# Patient Record
Sex: Female | Born: 1945 | Race: White | Hispanic: No | Marital: Married | Smoking: Former smoker
Health system: Southern US, Community
[De-identification: ages and names within clinical notes are randomized; demographics above are authoritative.]

## PROBLEM LIST (undated history)

## (undated) DIAGNOSIS — R011 Cardiac murmur, unspecified: Secondary | ICD-10-CM

## (undated) DIAGNOSIS — F419 Anxiety disorder, unspecified: Secondary | ICD-10-CM

## (undated) DIAGNOSIS — F329 Major depressive disorder, single episode, unspecified: Secondary | ICD-10-CM

## (undated) DIAGNOSIS — J45909 Unspecified asthma, uncomplicated: Secondary | ICD-10-CM

## (undated) DIAGNOSIS — G709 Myoneural disorder, unspecified: Secondary | ICD-10-CM

## (undated) DIAGNOSIS — I639 Cerebral infarction, unspecified: Secondary | ICD-10-CM

## (undated) DIAGNOSIS — D689 Coagulation defect, unspecified: Secondary | ICD-10-CM

## (undated) DIAGNOSIS — F0390 Unspecified dementia without behavioral disturbance: Secondary | ICD-10-CM

## (undated) DIAGNOSIS — E039 Hypothyroidism, unspecified: Secondary | ICD-10-CM

## (undated) DIAGNOSIS — H269 Unspecified cataract: Secondary | ICD-10-CM

## (undated) DIAGNOSIS — J1282 Pneumonia due to coronavirus disease 2019: Secondary | ICD-10-CM

## (undated) DIAGNOSIS — I219 Acute myocardial infarction, unspecified: Secondary | ICD-10-CM

## (undated) DIAGNOSIS — Z5189 Encounter for other specified aftercare: Secondary | ICD-10-CM

## (undated) DIAGNOSIS — R0600 Dyspnea, unspecified: Secondary | ICD-10-CM

## (undated) DIAGNOSIS — U071 COVID-19: Secondary | ICD-10-CM

## (undated) DIAGNOSIS — M1990 Unspecified osteoarthritis, unspecified site: Secondary | ICD-10-CM

## (undated) DIAGNOSIS — E079 Disorder of thyroid, unspecified: Secondary | ICD-10-CM

## (undated) DIAGNOSIS — I4891 Unspecified atrial fibrillation: Secondary | ICD-10-CM

## (undated) DIAGNOSIS — F32A Depression, unspecified: Secondary | ICD-10-CM

## (undated) DIAGNOSIS — R000 Tachycardia, unspecified: Secondary | ICD-10-CM

## (undated) DIAGNOSIS — N189 Chronic kidney disease, unspecified: Secondary | ICD-10-CM

## (undated) DIAGNOSIS — R519 Headache, unspecified: Secondary | ICD-10-CM

## (undated) DIAGNOSIS — E785 Hyperlipidemia, unspecified: Secondary | ICD-10-CM

## (undated) DIAGNOSIS — K219 Gastro-esophageal reflux disease without esophagitis: Secondary | ICD-10-CM

## (undated) DIAGNOSIS — G4730 Sleep apnea, unspecified: Secondary | ICD-10-CM

## (undated) DIAGNOSIS — I509 Heart failure, unspecified: Secondary | ICD-10-CM

## (undated) DIAGNOSIS — T7840XA Allergy, unspecified, initial encounter: Secondary | ICD-10-CM

## (undated) DIAGNOSIS — D649 Anemia, unspecified: Secondary | ICD-10-CM

## (undated) DIAGNOSIS — J449 Chronic obstructive pulmonary disease, unspecified: Secondary | ICD-10-CM

## (undated) DIAGNOSIS — E119 Type 2 diabetes mellitus without complications: Secondary | ICD-10-CM

## (undated) DIAGNOSIS — Z87442 Personal history of urinary calculi: Secondary | ICD-10-CM

## (undated) DIAGNOSIS — R569 Unspecified convulsions: Secondary | ICD-10-CM

## (undated) HISTORY — DX: Sleep apnea, unspecified: G47.30

## (undated) HISTORY — DX: Type 2 diabetes mellitus without complications: E11.9

## (undated) HISTORY — DX: Major depressive disorder, single episode, unspecified: F32.9

## (undated) HISTORY — DX: Chronic obstructive pulmonary disease, unspecified: J44.9

## (undated) HISTORY — DX: Depression, unspecified: F32.A

## (undated) HISTORY — DX: Chronic kidney disease, unspecified: N18.9

## (undated) HISTORY — DX: Encounter for other specified aftercare: Z51.89

## (undated) HISTORY — PX: APPENDECTOMY: SHX54

## (undated) HISTORY — DX: Unspecified atrial fibrillation: I48.91

## (undated) HISTORY — DX: Unspecified asthma, uncomplicated: J45.909

## (undated) HISTORY — DX: Disorder of thyroid, unspecified: E07.9

## (undated) HISTORY — DX: Allergy, unspecified, initial encounter: T78.40XA

## (undated) HISTORY — PX: CHOLECYSTECTOMY: SHX55

## (undated) HISTORY — PX: CORONARY ARTERY BYPASS GRAFT: SHX141

## (undated) HISTORY — PX: EYE SURGERY: SHX253

## (undated) HISTORY — DX: Unspecified osteoarthritis, unspecified site: M19.90

## (undated) HISTORY — PX: ABDOMINAL HYSTERECTOMY: SHX81

## (undated) HISTORY — DX: Cerebral infarction, unspecified: I63.9

## (undated) HISTORY — DX: Heart failure, unspecified: I50.9

## (undated) HISTORY — DX: Anxiety disorder, unspecified: F41.9

## (undated) HISTORY — DX: Myoneural disorder, unspecified: G70.9

## (undated) HISTORY — DX: Acute myocardial infarction, unspecified: I21.9

## (undated) HISTORY — DX: Anemia, unspecified: D64.9

## (undated) HISTORY — PX: FRACTURE SURGERY: SHX138

## (undated) HISTORY — DX: Hyperlipidemia, unspecified: E78.5

## (undated) HISTORY — DX: Unspecified cataract: H26.9

## (undated) HISTORY — DX: Gastro-esophageal reflux disease without esophagitis: K21.9

## (undated) HISTORY — DX: Unspecified convulsions: R56.9

## (undated) HISTORY — DX: Cardiac murmur, unspecified: R01.1

## (undated) HISTORY — PX: TONSILLECTOMY AND ADENOIDECTOMY: SHX28

## (undated) HISTORY — PX: JOINT REPLACEMENT: SHX530

## (undated) HISTORY — DX: Coagulation defect, unspecified: D68.9

## (undated) HISTORY — PX: OTHER SURGICAL HISTORY: SHX169

## (undated) HISTORY — PX: ESOPHAGOGASTRODUODENOSCOPY ENDOSCOPY: SHX5814

## (undated) HISTORY — PX: TUBAL LIGATION: SHX77

---

## 2006-06-13 ENCOUNTER — Encounter
Admission: RE | Admit: 2006-06-13 | Discharge: 2006-09-11 | Payer: Self-pay | Admitting: Physical Medicine & Rehabilitation

## 2006-06-13 ENCOUNTER — Ambulatory Visit: Payer: Self-pay | Admitting: Physical Medicine & Rehabilitation

## 2006-06-14 ENCOUNTER — Encounter: Admission: RE | Admit: 2006-06-14 | Discharge: 2006-06-14 | Payer: Self-pay | Admitting: Vascular Surgery

## 2006-06-14 ENCOUNTER — Ambulatory Visit: Payer: Self-pay | Admitting: Vascular Surgery

## 2006-07-16 ENCOUNTER — Ambulatory Visit: Payer: Self-pay | Admitting: Physical Medicine & Rehabilitation

## 2006-08-01 ENCOUNTER — Encounter: Admission: RE | Admit: 2006-08-01 | Discharge: 2006-10-30 | Payer: Self-pay | Admitting: Anesthesiology

## 2006-08-06 ENCOUNTER — Ambulatory Visit: Payer: Self-pay | Admitting: Anesthesiology

## 2006-08-16 ENCOUNTER — Ambulatory Visit: Payer: Self-pay | Admitting: Physical Medicine & Rehabilitation

## 2006-10-11 ENCOUNTER — Ambulatory Visit: Payer: Self-pay | Admitting: Physical Medicine & Rehabilitation

## 2006-11-21 ENCOUNTER — Encounter
Admission: RE | Admit: 2006-11-21 | Discharge: 2007-01-21 | Payer: Self-pay | Admitting: Physical Medicine & Rehabilitation

## 2006-11-21 ENCOUNTER — Ambulatory Visit: Payer: Self-pay | Admitting: Physical Medicine & Rehabilitation

## 2007-02-21 ENCOUNTER — Ambulatory Visit: Payer: Self-pay | Admitting: Physical Medicine & Rehabilitation

## 2007-02-21 ENCOUNTER — Encounter
Admission: RE | Admit: 2007-02-21 | Discharge: 2007-02-24 | Payer: Self-pay | Admitting: Physical Medicine & Rehabilitation

## 2009-07-18 ENCOUNTER — Ambulatory Visit: Payer: Self-pay | Admitting: Vascular Surgery

## 2010-09-05 NOTE — Assessment & Plan Note (Signed)
Jenny Kelly is back regarding her abdominal knee pain.  Knee is continuing to  do well.  She had been doing fairly well until recently when she has had  a few more spikes in her abdominal pain.  We did not get her cleared for  the celiac plexus blocks due to anesthesia concerns over bleeding so  this effort was abandoned.  Cymbalta helps her mood.  She uses Donnatal  for spasms with some relief.  She tolerates her pain as 6-7/10.  Describes it as constant, dull and sharp.  Sleep is poor due to pain.  Does use Klonopin apparently at night for sleep.   REVIEW OF SYSTEMS:  Notable for spasm, compression and numbness.  Other  pertinent problems listed above and full review has been written in the  health and history section of the chart.   SOCIAL HISTORY:  Without significant change.   PHYSICAL EXAMINATION:  Blood pressure is 157/87, pulse 76, respiratory  rate 18.  She is sating 100% on room air.  The patient was pleasant,  alert and oriented x3.  Her weight has increased a bit and is noticeable  on exam today.  The patient is tan.  Skin is intact. Pulses are 2+.  Cognitively she is appropriate.  Heart is regular.  Chest is clear.  Abdomen soft, nontender.  Motor function is 5/5 with normal sensory exam  today.   ASSESSMENT:  1. Chronic abdominal pain related to irritable bowel syndrome.  2. Questionable interstitial cystitis.  3. Right knee osteoarthritis.  4. Anxiety disorder.  5. Insomnia.   PLAN:  1. Continue peridium and Donnatal p.r.n.  Will add scopolamine p.r.n.      for abdominal spasms as well. Norco for breakthrough  pain for now      10/325 1 q 6 h p.r.n.  Consider stronger breakthrough pain      medication such as Dilaudid for more severe spasms.  2. Consider long acting opiate.  3. Continue to use Cymbalta for mood.  4. Continue activity and exercise.  I am encouraged by her increased      appetite and weight gain.  5. Continue Flector Patches for arthritis.      Jenny Kelly, M.D.  Electronically Signed     ZTS/MedQ  D:  11/25/2006 11:13:13  T:  11/25/2006 11:30:06  Job #:  [id number]   cc:   Jenny Kelly. Jenny Kelly, M.D.  Fax: [phone]

## 2010-09-05 NOTE — Assessment & Plan Note (Signed)
Jenny Kelly is back regarding her abdominal pain.  We have had some conflicts  regarding her anticoagulation and potential celiac plexus block.  It  seems to me that we are on the same page now.  The patient hopes to have  her injection.  The patient's pain has been about stable, it is a 4-5  out of 10.  She does complain more of swelling as of late.  She tends to  have some swelling in the summertime.  She is on Lasix.  Other  identifiable cause has been found.  INR has actually been high as of  late.  She uses her Norco 10/325 for breakthrough pain.  Uses it 1 q.6  h. p.r.n.  Pain interferes with general activity, relations with others,  and enjoyment of life on a moderate level.  Pain is intermittent, sharp,  dull, and sometimes aching.  Sleep is poor at times.  She does like the  Cymbalta that we started for her mood, and she has more energy with this  as well.  Pyridium has helped her bladder spasms.  She uses Donnatal  occasionally for breakthrough symptomatology.   REVIEW OF SYSTEMS:  Notable for depression, weakness, urine retention,  nausea, constipation, abdominal pain, poor appetite.  Other pertinent  positives are listed above and full review is in the health and history  section.   SOCIAL HISTORY:  Without significant change.   PHYSICAL EXAMINATION:  Blood pressure is 113/43, pulse is 68,  respiratory rate 18.  She is satting 99% on room air.  The patient is pleasant, alert and oriented x3.  Affect is bright and  generally appropriate.  She has 1+ edema in both legs.  Legs are cool.  She did have pulses at  posterior, tibial and dorsalis pedis arteries.  CHEST:  Clear.  ABDOMEN:  Soft, nontender today.  Cognitively she was appropriate.  Motor functions 5/5.  Sensory exam is intact.   ASSESSMENT:  1. Chronic abdominal pain related to irritable bowel syndrome, failing      multiple modalities and treatments.  Questionable interstitial      cystitis as well.  2. Right knee  osteoarthritis, doing well on Flector patches.  3. Anxiety disorder.   PLAN:  1. Will set patient up again for celiac plexus block with Dr. Stevphen Rochester      hopefully next month.  2. Continue Pyridium and Donnatal p.r.n. for bladder and bowel spasms.  3. Norco for breakthrough pain.  4. Cymbalta for mood.  5. Flector patches for arthritic symptoms.      Ranelle Oyster, M.D.  Electronically Signed     ZTS/MedQ  D:  10/16/2006 11:41:58  T:  10/16/2006 16:05:29  Job #:  [id number]   cc:   Quita Skye. Artis Flock, M.D.  Fax: [phone]

## 2010-09-05 NOTE — Assessment & Plan Note (Signed)
Jenny Kelly is back regarding her abdominal pain. She actually did very well  with the p.r.n. scopolamine, although she has been out of it for the  last month or so. She had been away in [place] for some time visiting  family. She complains of some weight gain as well as some abdominal  swelling in the upper quadrants. She rates her pain at a 3/4. In  general, her pain has been better. She has some dull and aching pain at  times. Her legs occasionally bother her. Sleep can be poor. She has had  a rash that she notes and is not sure of the source.   REVIEW OF SYSTEMS:  Is notable for tremor, confusion and depression.  Other pertinent positives are listed above. She remains on Coumadin.  Full review is in the written Health and History section on the chart.   SOCIAL HISTORY:  The patient is married.   PHYSICAL EXAMINATION:  Blood pressure 137/57, pulse 88. Respiratory rate  is 18. She is satting 99% on room air. The patient is pleasant, alert  and oriented x3. Her weight has again increased a bit. She had some  distention in the abdomen. I question whether there was some stool in  her gut still. She had some spasms in the rectus abdominis areas. The  area was somewhat tender to deep palpation.  CHEST: Clear.  HEART: Regular.  Motor function is 5/5 and normal sensory examination.  Mood was good. Cognition was appropriate.   ASSESSMENT:  1. Chronic abdominal pain related to irritable bowel syndrome. Patient      likely with some constipation and skeletal muscle spasm as well.  2. Right knee osteoarthritis.  3. Anxiety disorder.  4. Insomnia.   PLAN:  1. Continue scopolamine p.r.n. for GI spasms in addition to her      Donnatal.  2. Start Norco for breakthrough pain.  3. Discussed appropriate bowel regimen.  4. I will send her to therapy for abdominal and oblique muscle      elongation and core muscle strengthening and stabilization. Needs      to get on a good stretching program and  exercise program as well.  5. Continue Cymbalta for mood.  6. Flector patches for osteoarthritis.  7. I will see her back in three months' time. I did refill her Atarax      for hives as well today.      Ranelle Oyster, M.D.  Electronically Signed     ZTS/MedQ  D:  02/24/2007 11:25:25  T:  02/24/2007 21:10:58  Job #:  [id number]   cc:   Quita Skye. Artis Flock, M.D.  Fax: [phone]

## 2010-09-08 NOTE — Group Therapy Note (Signed)
CHIEF COMPLAINT:  Abdominal pain.   HISTORY OF PRESENT ILLNESS:  This is a 65 year old white female with a  three-plus-year history of abdominal pain, both in the epigastric and  hypogastric areas.  She has seen multiple physicians over the last few  years, including 2 GI physicians in [place].  She has seen Dr. Darrick Penna  of Vascular Surgery here in [place].  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  She  has had CT's of the pelvis and abdomen which have been unremarkable.  She has had no clear diagnosis made for her problem.  The patient has  tried a few medications that include Fioricet,  Ultracet, Ultram,  Vicodin and OxyContin for pain.  She felt that the oxycodone and Vicodin  were most helpful for her symptoms.  She was told eventually that her  abdominal pain was not entirely accounted for by the vascular disease  that she has there.  I see a note from Dr. Jennye Boroughs that the patient  noted little improvement on Bentyl or Symax.  She finds that rest helps  her pain.  She does note however, that the pain is persistent  regardless.  The eating seems to make it the worse as with walking and  standing.  She describes the pain as stabbing, aching and constant.  She  rates it a 9 out of 10.  The pain interferes with activities of daily  living, relationship with others and enjoyment of life on a severe  level.  The patient also notes recently over the last year or two,  ongoing knee pain with radiation into the foot.  Seems to be a more  acute exacerbation by the chart over the last few weeks.  She has had 2  evaluations of the knee which was replaced in 2002 by Dr. Bevely Palmer and  both reports were positive for intact  knee.   The patient reports poor sleep.  She reports some anxiety.  She also  complains of migraine headaches and restless leg symptoms.  The restless  leg has been helped by Mirapex.  She uses Fioricet for night time  headaches which helps occasionally.  The patient's course has been  significant for weight loss in the context of the above, which has  concerned her physicians.   REVIEW OF SYSTEMS:  Positive for numbness, tremor, spasms, confusion,  depression, lost taste, weight gain, constipation, urine retention,  nausea, shortness of breath.  She has diarrhea and constipation  alternating. Full review in the health and history section and other  pertinent positives listed above.   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  She has had stenting of the RCA  as well after that heart attack.  She had a TH and appendectomy in 1956.  The patient has also had ankle surgery in 1985, gallbladder removal in  2007, ear surgery in 1973.   CURRENT MEDICATIONS:  Include:  1. Ultram p.r.n.  2. Coumadin 5 mg daily.  3. Klonopin  0.51 mg nightly.  4. Lasix 20 mg daily.  5. Mirapex 1 to 1.5  mg nightly and occasionally during the day.  6. Iron supplement daily.  7. Protonix daily.  8. Lidoderm patches p.r.n.  9. Fioricet nightly p.r.n.   She is currently off of her Cymbalta.   ALLERGIES:  COMPAZINE, SULFA DRUGS, NEURONTIN which causes confusion.   SOCIAL HISTORY:  The patient is married and lives with her husband  currently.  She denies alcohol or smoking.   FAMILY HISTORY:  Minimal heart disease, diabetes, high blood pressure.  Other pertinent positives are above.   PHYSICAL EXAMINATION:  Blood pressure is 122/61.  Pulse is 71,  respiratory rate 18.  She is satting 97% on room air.  She was wearing 2  L of oxygen when she came into the room but took  this off upon sitting  on the exam table.  The patient is fairly pleasant, no acute distress.  She is alert and oriented x3.  Affect is bright and appropriate.  She  did become anxious at times.  Gait was slightly antalgic, favoring the  right knee.  Refluxes were 2 to 2+ throughout.  Sensation was grossly  intact. Motor function was near 5 out of 5 in the upper extremities  except some pain inhibition from the abdomen.  Leg strength was  generally 5 out of 5 as well, except for pain inhibition at the knee.  There was some mild crepitus appreciated at the knee.  No swelling was  seen.  No obvious knee instability was found.  Appearance and color of  the leg was normal today.  The patient's heart was regular.  Her chest was clear.  She had some tenderness throughout the abdomen today upon examination.  Bowel sounds were positive.   ASSESSMENT:  1. Chronic abdominal pain, most consistent with irritable bowel      syndrome.  The patient has failed trials of Bentyl and Symax      previously.  2. Right knee and leg pain with no clear origin.  She has had 2      unremarkable evaluations by orthopedic surgery for the right knee.  3. Anxiety disorder.   PLAN:  1. There are several ways we could go with the abdominal pain      treatment.  I would like to see the patient's records from Dr.      Darrick Penna regarding her vascular map of the abdomen.  2. I don't believe the vascular picture is the true cause of her pain.      It appears more related to irritable bowel syndrome.  We will      experiment with other medications.  Consider local block.  May      benefit from a long-acting opioid if symptoms are severe enough.  3. Today we gave the patient Vicodin 5/500 one q. 8 hours for baseline      pain control.  4. Resume Cymbalta 60 mg daily after 30 mg q. a.m. first week trial.  5. Try Flector (diclofenac) patches to the right knee 1 every 12 hours     to see if we can improve some of the  osteoarthritic pain that is      taking place there.  6. I will see the patient back in about a months' time.      Ranelle Oyster, M.D.  Electronically Signed     ZTS/MedQ  D:  06/17/2006 14:56:11  T:  06/17/2006 17:20:34  Job #:  [id number]   cc:   Quita Skye. Artis Flock, M.D.  Fax:  [phone] 

## 2014-03-10 DIAGNOSIS — T8149XA Infection following a procedure, other surgical site, initial encounter: Secondary | ICD-10-CM

## 2014-03-10 DIAGNOSIS — Z951 Presence of aortocoronary bypass graft: Secondary | ICD-10-CM | POA: Insufficient documentation

## 2014-03-10 DIAGNOSIS — I2510 Atherosclerotic heart disease of native coronary artery without angina pectoris: Secondary | ICD-10-CM | POA: Insufficient documentation

## 2014-03-10 HISTORY — DX: Infection following a procedure, other surgical site, initial encounter: T81.49XA

## 2014-06-25 DIAGNOSIS — E119 Type 2 diabetes mellitus without complications: Secondary | ICD-10-CM | POA: Diagnosis not present

## 2014-06-25 DIAGNOSIS — J209 Acute bronchitis, unspecified: Secondary | ICD-10-CM | POA: Diagnosis not present

## 2014-06-25 DIAGNOSIS — E785 Hyperlipidemia, unspecified: Secondary | ICD-10-CM | POA: Diagnosis not present

## 2014-07-19 DIAGNOSIS — R05 Cough: Secondary | ICD-10-CM | POA: Diagnosis not present

## 2014-07-19 DIAGNOSIS — J189 Pneumonia, unspecified organism: Secondary | ICD-10-CM | POA: Diagnosis not present

## 2014-07-29 DIAGNOSIS — G4733 Obstructive sleep apnea (adult) (pediatric): Secondary | ICD-10-CM | POA: Diagnosis not present

## 2014-07-30 DIAGNOSIS — R531 Weakness: Secondary | ICD-10-CM | POA: Diagnosis not present

## 2014-08-23 DIAGNOSIS — R1310 Dysphagia, unspecified: Secondary | ICD-10-CM | POA: Diagnosis not present

## 2014-08-23 DIAGNOSIS — K591 Functional diarrhea: Secondary | ICD-10-CM | POA: Diagnosis not present

## 2014-08-23 DIAGNOSIS — K219 Gastro-esophageal reflux disease without esophagitis: Secondary | ICD-10-CM | POA: Diagnosis not present

## 2014-09-07 DIAGNOSIS — I739 Peripheral vascular disease, unspecified: Secondary | ICD-10-CM | POA: Diagnosis not present

## 2014-09-07 DIAGNOSIS — I2510 Atherosclerotic heart disease of native coronary artery without angina pectoris: Secondary | ICD-10-CM | POA: Diagnosis not present

## 2014-09-07 DIAGNOSIS — E785 Hyperlipidemia, unspecified: Secondary | ICD-10-CM | POA: Diagnosis not present

## 2014-09-10 DIAGNOSIS — Z1211 Encounter for screening for malignant neoplasm of colon: Secondary | ICD-10-CM | POA: Diagnosis not present

## 2014-09-10 DIAGNOSIS — Z86010 Personal history of colonic polyps: Secondary | ICD-10-CM | POA: Diagnosis not present

## 2014-09-10 DIAGNOSIS — I252 Old myocardial infarction: Secondary | ICD-10-CM | POA: Diagnosis not present

## 2014-09-10 DIAGNOSIS — K5730 Diverticulosis of large intestine without perforation or abscess without bleeding: Secondary | ICD-10-CM | POA: Diagnosis not present

## 2014-09-10 DIAGNOSIS — K200 Eosinophilic esophagitis: Secondary | ICD-10-CM | POA: Diagnosis not present

## 2014-09-10 DIAGNOSIS — K648 Other hemorrhoids: Secondary | ICD-10-CM | POA: Diagnosis not present

## 2014-09-10 DIAGNOSIS — K449 Diaphragmatic hernia without obstruction or gangrene: Secondary | ICD-10-CM | POA: Diagnosis not present

## 2014-09-10 DIAGNOSIS — K219 Gastro-esophageal reflux disease without esophagitis: Secondary | ICD-10-CM | POA: Diagnosis not present

## 2014-09-10 DIAGNOSIS — K2970 Gastritis, unspecified, without bleeding: Secondary | ICD-10-CM | POA: Diagnosis not present

## 2014-09-10 DIAGNOSIS — R1310 Dysphagia, unspecified: Secondary | ICD-10-CM | POA: Diagnosis not present

## 2014-09-10 DIAGNOSIS — Z800 Family history of malignant neoplasm of digestive organs: Secondary | ICD-10-CM | POA: Diagnosis not present

## 2014-10-11 DIAGNOSIS — I4891 Unspecified atrial fibrillation: Secondary | ICD-10-CM | POA: Diagnosis not present

## 2014-10-11 DIAGNOSIS — Z0000 Encounter for general adult medical examination without abnormal findings: Secondary | ICD-10-CM | POA: Diagnosis not present

## 2014-10-11 DIAGNOSIS — I2510 Atherosclerotic heart disease of native coronary artery without angina pectoris: Secondary | ICD-10-CM | POA: Diagnosis not present

## 2014-10-11 DIAGNOSIS — K589 Irritable bowel syndrome without diarrhea: Secondary | ICD-10-CM | POA: Diagnosis not present

## 2014-10-11 DIAGNOSIS — E785 Hyperlipidemia, unspecified: Secondary | ICD-10-CM | POA: Diagnosis not present

## 2014-10-11 DIAGNOSIS — E119 Type 2 diabetes mellitus without complications: Secondary | ICD-10-CM | POA: Diagnosis not present

## 2014-10-11 DIAGNOSIS — Z1389 Encounter for screening for other disorder: Secondary | ICD-10-CM | POA: Diagnosis not present

## 2014-10-11 DIAGNOSIS — L409 Psoriasis, unspecified: Secondary | ICD-10-CM | POA: Diagnosis not present

## 2014-10-12 DIAGNOSIS — R946 Abnormal results of thyroid function studies: Secondary | ICD-10-CM | POA: Diagnosis not present

## 2014-10-12 DIAGNOSIS — N179 Acute kidney failure, unspecified: Secondary | ICD-10-CM | POA: Diagnosis not present

## 2014-10-13 DIAGNOSIS — K591 Functional diarrhea: Secondary | ICD-10-CM | POA: Diagnosis not present

## 2014-10-13 DIAGNOSIS — R1310 Dysphagia, unspecified: Secondary | ICD-10-CM | POA: Diagnosis not present

## 2014-10-18 DIAGNOSIS — D519 Vitamin B12 deficiency anemia, unspecified: Secondary | ICD-10-CM | POA: Diagnosis not present

## 2014-10-19 DIAGNOSIS — N179 Acute kidney failure, unspecified: Secondary | ICD-10-CM | POA: Diagnosis not present

## 2014-10-19 DIAGNOSIS — Z1231 Encounter for screening mammogram for malignant neoplasm of breast: Secondary | ICD-10-CM | POA: Diagnosis not present

## 2014-10-20 DIAGNOSIS — I739 Peripheral vascular disease, unspecified: Secondary | ICD-10-CM | POA: Diagnosis not present

## 2014-10-20 DIAGNOSIS — I639 Cerebral infarction, unspecified: Secondary | ICD-10-CM | POA: Diagnosis not present

## 2014-11-15 DIAGNOSIS — D519 Vitamin B12 deficiency anemia, unspecified: Secondary | ICD-10-CM | POA: Diagnosis not present

## 2014-11-17 DIAGNOSIS — I2510 Atherosclerotic heart disease of native coronary artery without angina pectoris: Secondary | ICD-10-CM | POA: Diagnosis not present

## 2014-11-17 DIAGNOSIS — N390 Urinary tract infection, site not specified: Secondary | ICD-10-CM | POA: Diagnosis not present

## 2014-11-17 DIAGNOSIS — D649 Anemia, unspecified: Secondary | ICD-10-CM | POA: Diagnosis not present

## 2014-11-17 DIAGNOSIS — N183 Chronic kidney disease, stage 3 (moderate): Secondary | ICD-10-CM | POA: Diagnosis not present

## 2014-11-17 DIAGNOSIS — I4891 Unspecified atrial fibrillation: Secondary | ICD-10-CM | POA: Diagnosis not present

## 2014-11-17 DIAGNOSIS — E119 Type 2 diabetes mellitus without complications: Secondary | ICD-10-CM | POA: Diagnosis not present

## 2014-11-17 DIAGNOSIS — N179 Acute kidney failure, unspecified: Secondary | ICD-10-CM | POA: Diagnosis not present

## 2014-11-17 DIAGNOSIS — I959 Hypotension, unspecified: Secondary | ICD-10-CM | POA: Diagnosis not present

## 2014-11-25 DIAGNOSIS — L299 Pruritus, unspecified: Secondary | ICD-10-CM | POA: Diagnosis not present

## 2014-11-25 DIAGNOSIS — L301 Dyshidrosis [pompholyx]: Secondary | ICD-10-CM | POA: Diagnosis not present

## 2014-11-25 DIAGNOSIS — L853 Xerosis cutis: Secondary | ICD-10-CM | POA: Diagnosis not present

## 2014-12-17 DIAGNOSIS — D519 Vitamin B12 deficiency anemia, unspecified: Secondary | ICD-10-CM | POA: Diagnosis not present

## 2014-12-17 DIAGNOSIS — J189 Pneumonia, unspecified organism: Secondary | ICD-10-CM | POA: Diagnosis not present

## 2014-12-17 DIAGNOSIS — J441 Chronic obstructive pulmonary disease with (acute) exacerbation: Secondary | ICD-10-CM | POA: Diagnosis not present

## 2014-12-21 DIAGNOSIS — N179 Acute kidney failure, unspecified: Secondary | ICD-10-CM | POA: Diagnosis not present

## 2014-12-21 DIAGNOSIS — E119 Type 2 diabetes mellitus without complications: Secondary | ICD-10-CM | POA: Diagnosis not present

## 2014-12-21 DIAGNOSIS — I4891 Unspecified atrial fibrillation: Secondary | ICD-10-CM | POA: Diagnosis not present

## 2014-12-21 DIAGNOSIS — N183 Chronic kidney disease, stage 3 (moderate): Secondary | ICD-10-CM | POA: Diagnosis not present

## 2014-12-21 DIAGNOSIS — J189 Pneumonia, unspecified organism: Secondary | ICD-10-CM | POA: Diagnosis not present

## 2015-01-18 DIAGNOSIS — E538 Deficiency of other specified B group vitamins: Secondary | ICD-10-CM | POA: Diagnosis not present

## 2015-01-18 DIAGNOSIS — E039 Hypothyroidism, unspecified: Secondary | ICD-10-CM | POA: Diagnosis not present

## 2015-01-18 DIAGNOSIS — N183 Chronic kidney disease, stage 3 (moderate): Secondary | ICD-10-CM | POA: Diagnosis not present

## 2015-01-18 DIAGNOSIS — Z23 Encounter for immunization: Secondary | ICD-10-CM | POA: Diagnosis not present

## 2015-01-20 DIAGNOSIS — N390 Urinary tract infection, site not specified: Secondary | ICD-10-CM | POA: Diagnosis not present

## 2015-01-20 DIAGNOSIS — I4891 Unspecified atrial fibrillation: Secondary | ICD-10-CM | POA: Diagnosis not present

## 2015-01-20 DIAGNOSIS — N179 Acute kidney failure, unspecified: Secondary | ICD-10-CM | POA: Diagnosis not present

## 2015-01-20 DIAGNOSIS — N183 Chronic kidney disease, stage 3 (moderate): Secondary | ICD-10-CM | POA: Diagnosis not present

## 2015-02-18 DIAGNOSIS — E039 Hypothyroidism, unspecified: Secondary | ICD-10-CM | POA: Diagnosis not present

## 2015-02-18 DIAGNOSIS — E538 Deficiency of other specified B group vitamins: Secondary | ICD-10-CM | POA: Diagnosis not present

## 2015-03-21 DIAGNOSIS — E538 Deficiency of other specified B group vitamins: Secondary | ICD-10-CM | POA: Diagnosis not present

## 2015-03-23 DIAGNOSIS — E119 Type 2 diabetes mellitus without complications: Secondary | ICD-10-CM | POA: Diagnosis not present

## 2015-03-23 DIAGNOSIS — N183 Chronic kidney disease, stage 3 (moderate): Secondary | ICD-10-CM | POA: Diagnosis not present

## 2015-03-23 DIAGNOSIS — I4891 Unspecified atrial fibrillation: Secondary | ICD-10-CM | POA: Diagnosis not present

## 2015-03-23 DIAGNOSIS — N179 Acute kidney failure, unspecified: Secondary | ICD-10-CM | POA: Diagnosis not present

## 2015-03-24 DIAGNOSIS — N183 Chronic kidney disease, stage 3 (moderate): Secondary | ICD-10-CM | POA: Diagnosis not present

## 2015-04-27 DIAGNOSIS — N183 Chronic kidney disease, stage 3 (moderate): Secondary | ICD-10-CM | POA: Diagnosis not present

## 2015-04-27 DIAGNOSIS — E538 Deficiency of other specified B group vitamins: Secondary | ICD-10-CM | POA: Diagnosis not present

## 2015-04-27 DIAGNOSIS — E039 Hypothyroidism, unspecified: Secondary | ICD-10-CM | POA: Diagnosis not present

## 2015-04-27 DIAGNOSIS — J439 Emphysema, unspecified: Secondary | ICD-10-CM | POA: Diagnosis not present

## 2015-04-27 DIAGNOSIS — E119 Type 2 diabetes mellitus without complications: Secondary | ICD-10-CM | POA: Diagnosis not present

## 2015-05-24 DIAGNOSIS — R51 Headache: Secondary | ICD-10-CM | POA: Diagnosis not present

## 2015-05-24 DIAGNOSIS — W19XXXD Unspecified fall, subsequent encounter: Secondary | ICD-10-CM | POA: Diagnosis not present

## 2015-05-25 DIAGNOSIS — N390 Urinary tract infection, site not specified: Secondary | ICD-10-CM | POA: Diagnosis not present

## 2015-05-25 DIAGNOSIS — R251 Tremor, unspecified: Secondary | ICD-10-CM | POA: Diagnosis not present

## 2015-05-25 DIAGNOSIS — S0990XA Unspecified injury of head, initial encounter: Secondary | ICD-10-CM | POA: Diagnosis not present

## 2015-05-25 DIAGNOSIS — I672 Cerebral atherosclerosis: Secondary | ICD-10-CM | POA: Diagnosis not present

## 2015-05-25 DIAGNOSIS — R52 Pain, unspecified: Secondary | ICD-10-CM | POA: Diagnosis not present

## 2015-05-25 DIAGNOSIS — R51 Headache: Secondary | ICD-10-CM | POA: Diagnosis not present

## 2015-05-30 DIAGNOSIS — E538 Deficiency of other specified B group vitamins: Secondary | ICD-10-CM | POA: Diagnosis not present

## 2015-06-02 DIAGNOSIS — W19XXXD Unspecified fall, subsequent encounter: Secondary | ICD-10-CM | POA: Diagnosis not present

## 2015-06-02 DIAGNOSIS — R350 Frequency of micturition: Secondary | ICD-10-CM | POA: Diagnosis not present

## 2015-06-07 DIAGNOSIS — I252 Old myocardial infarction: Secondary | ICD-10-CM | POA: Diagnosis not present

## 2015-06-07 DIAGNOSIS — J449 Chronic obstructive pulmonary disease, unspecified: Secondary | ICD-10-CM | POA: Diagnosis not present

## 2015-06-07 DIAGNOSIS — J9610 Chronic respiratory failure, unspecified whether with hypoxia or hypercapnia: Secondary | ICD-10-CM | POA: Diagnosis not present

## 2015-06-07 DIAGNOSIS — I69351 Hemiplegia and hemiparesis following cerebral infarction affecting right dominant side: Secondary | ICD-10-CM | POA: Diagnosis not present

## 2015-06-07 DIAGNOSIS — M549 Dorsalgia, unspecified: Secondary | ICD-10-CM | POA: Diagnosis not present

## 2015-06-07 DIAGNOSIS — I2510 Atherosclerotic heart disease of native coronary artery without angina pectoris: Secondary | ICD-10-CM | POA: Diagnosis not present

## 2015-06-10 DIAGNOSIS — J449 Chronic obstructive pulmonary disease, unspecified: Secondary | ICD-10-CM | POA: Diagnosis not present

## 2015-06-10 DIAGNOSIS — M549 Dorsalgia, unspecified: Secondary | ICD-10-CM | POA: Diagnosis not present

## 2015-06-10 DIAGNOSIS — I69351 Hemiplegia and hemiparesis following cerebral infarction affecting right dominant side: Secondary | ICD-10-CM | POA: Diagnosis not present

## 2015-06-10 DIAGNOSIS — I252 Old myocardial infarction: Secondary | ICD-10-CM | POA: Diagnosis not present

## 2015-06-10 DIAGNOSIS — I2510 Atherosclerotic heart disease of native coronary artery without angina pectoris: Secondary | ICD-10-CM | POA: Diagnosis not present

## 2015-06-10 DIAGNOSIS — J9610 Chronic respiratory failure, unspecified whether with hypoxia or hypercapnia: Secondary | ICD-10-CM | POA: Diagnosis not present

## 2015-06-13 DIAGNOSIS — J449 Chronic obstructive pulmonary disease, unspecified: Secondary | ICD-10-CM | POA: Diagnosis not present

## 2015-06-13 DIAGNOSIS — J9610 Chronic respiratory failure, unspecified whether with hypoxia or hypercapnia: Secondary | ICD-10-CM | POA: Diagnosis not present

## 2015-06-13 DIAGNOSIS — I2510 Atherosclerotic heart disease of native coronary artery without angina pectoris: Secondary | ICD-10-CM | POA: Diagnosis not present

## 2015-06-13 DIAGNOSIS — M549 Dorsalgia, unspecified: Secondary | ICD-10-CM | POA: Diagnosis not present

## 2015-06-13 DIAGNOSIS — I69351 Hemiplegia and hemiparesis following cerebral infarction affecting right dominant side: Secondary | ICD-10-CM | POA: Diagnosis not present

## 2015-06-13 DIAGNOSIS — I252 Old myocardial infarction: Secondary | ICD-10-CM | POA: Diagnosis not present

## 2015-06-15 DIAGNOSIS — J9610 Chronic respiratory failure, unspecified whether with hypoxia or hypercapnia: Secondary | ICD-10-CM | POA: Diagnosis not present

## 2015-06-15 DIAGNOSIS — I69351 Hemiplegia and hemiparesis following cerebral infarction affecting right dominant side: Secondary | ICD-10-CM | POA: Diagnosis not present

## 2015-06-15 DIAGNOSIS — I2510 Atherosclerotic heart disease of native coronary artery without angina pectoris: Secondary | ICD-10-CM | POA: Diagnosis not present

## 2015-06-15 DIAGNOSIS — J449 Chronic obstructive pulmonary disease, unspecified: Secondary | ICD-10-CM | POA: Diagnosis not present

## 2015-06-15 DIAGNOSIS — M549 Dorsalgia, unspecified: Secondary | ICD-10-CM | POA: Diagnosis not present

## 2015-06-15 DIAGNOSIS — I252 Old myocardial infarction: Secondary | ICD-10-CM | POA: Diagnosis not present

## 2015-06-20 DIAGNOSIS — I69351 Hemiplegia and hemiparesis following cerebral infarction affecting right dominant side: Secondary | ICD-10-CM | POA: Diagnosis not present

## 2015-06-20 DIAGNOSIS — I2510 Atherosclerotic heart disease of native coronary artery without angina pectoris: Secondary | ICD-10-CM | POA: Diagnosis not present

## 2015-06-20 DIAGNOSIS — J9610 Chronic respiratory failure, unspecified whether with hypoxia or hypercapnia: Secondary | ICD-10-CM | POA: Diagnosis not present

## 2015-06-20 DIAGNOSIS — M549 Dorsalgia, unspecified: Secondary | ICD-10-CM | POA: Diagnosis not present

## 2015-06-20 DIAGNOSIS — J449 Chronic obstructive pulmonary disease, unspecified: Secondary | ICD-10-CM | POA: Diagnosis not present

## 2015-06-20 DIAGNOSIS — I252 Old myocardial infarction: Secondary | ICD-10-CM | POA: Diagnosis not present

## 2015-06-21 DIAGNOSIS — E7800 Pure hypercholesterolemia, unspecified: Secondary | ICD-10-CM | POA: Insufficient documentation

## 2015-06-21 DIAGNOSIS — I480 Paroxysmal atrial fibrillation: Secondary | ICD-10-CM | POA: Insufficient documentation

## 2015-06-21 DIAGNOSIS — E119 Type 2 diabetes mellitus without complications: Secondary | ICD-10-CM | POA: Insufficient documentation

## 2015-06-21 DIAGNOSIS — E785 Hyperlipidemia, unspecified: Secondary | ICD-10-CM | POA: Insufficient documentation

## 2015-06-21 DIAGNOSIS — N183 Chronic kidney disease, stage 3 unspecified: Secondary | ICD-10-CM | POA: Insufficient documentation

## 2015-06-21 DIAGNOSIS — I4891 Unspecified atrial fibrillation: Secondary | ICD-10-CM | POA: Insufficient documentation

## 2015-06-21 DIAGNOSIS — N1831 Chronic kidney disease, stage 3a: Secondary | ICD-10-CM | POA: Insufficient documentation

## 2015-06-21 DIAGNOSIS — E039 Hypothyroidism, unspecified: Secondary | ICD-10-CM | POA: Insufficient documentation

## 2015-06-21 DIAGNOSIS — N2889 Other specified disorders of kidney and ureter: Secondary | ICD-10-CM

## 2015-06-21 DIAGNOSIS — N184 Chronic kidney disease, stage 4 (severe): Secondary | ICD-10-CM | POA: Diagnosis not present

## 2015-06-21 HISTORY — DX: Other specified disorders of kidney and ureter: N28.89

## 2015-06-23 DIAGNOSIS — I2510 Atherosclerotic heart disease of native coronary artery without angina pectoris: Secondary | ICD-10-CM | POA: Diagnosis not present

## 2015-06-23 DIAGNOSIS — J449 Chronic obstructive pulmonary disease, unspecified: Secondary | ICD-10-CM | POA: Diagnosis not present

## 2015-06-23 DIAGNOSIS — M549 Dorsalgia, unspecified: Secondary | ICD-10-CM | POA: Diagnosis not present

## 2015-06-23 DIAGNOSIS — J9610 Chronic respiratory failure, unspecified whether with hypoxia or hypercapnia: Secondary | ICD-10-CM | POA: Diagnosis not present

## 2015-06-23 DIAGNOSIS — I69351 Hemiplegia and hemiparesis following cerebral infarction affecting right dominant side: Secondary | ICD-10-CM | POA: Diagnosis not present

## 2015-06-23 DIAGNOSIS — I252 Old myocardial infarction: Secondary | ICD-10-CM | POA: Diagnosis not present

## 2015-06-27 DIAGNOSIS — I252 Old myocardial infarction: Secondary | ICD-10-CM | POA: Diagnosis not present

## 2015-06-27 DIAGNOSIS — J9610 Chronic respiratory failure, unspecified whether with hypoxia or hypercapnia: Secondary | ICD-10-CM | POA: Diagnosis not present

## 2015-06-27 DIAGNOSIS — I69351 Hemiplegia and hemiparesis following cerebral infarction affecting right dominant side: Secondary | ICD-10-CM | POA: Diagnosis not present

## 2015-06-27 DIAGNOSIS — I2510 Atherosclerotic heart disease of native coronary artery without angina pectoris: Secondary | ICD-10-CM | POA: Diagnosis not present

## 2015-06-27 DIAGNOSIS — J449 Chronic obstructive pulmonary disease, unspecified: Secondary | ICD-10-CM | POA: Diagnosis not present

## 2015-06-27 DIAGNOSIS — M549 Dorsalgia, unspecified: Secondary | ICD-10-CM | POA: Diagnosis not present

## 2015-06-29 DIAGNOSIS — I69351 Hemiplegia and hemiparesis following cerebral infarction affecting right dominant side: Secondary | ICD-10-CM | POA: Diagnosis not present

## 2015-06-29 DIAGNOSIS — I2510 Atherosclerotic heart disease of native coronary artery without angina pectoris: Secondary | ICD-10-CM | POA: Diagnosis not present

## 2015-06-29 DIAGNOSIS — J449 Chronic obstructive pulmonary disease, unspecified: Secondary | ICD-10-CM | POA: Diagnosis not present

## 2015-06-29 DIAGNOSIS — J9610 Chronic respiratory failure, unspecified whether with hypoxia or hypercapnia: Secondary | ICD-10-CM | POA: Diagnosis not present

## 2015-06-29 DIAGNOSIS — I252 Old myocardial infarction: Secondary | ICD-10-CM | POA: Diagnosis not present

## 2015-06-29 DIAGNOSIS — M549 Dorsalgia, unspecified: Secondary | ICD-10-CM | POA: Diagnosis not present

## 2015-06-30 DIAGNOSIS — E538 Deficiency of other specified B group vitamins: Secondary | ICD-10-CM | POA: Diagnosis not present

## 2015-07-05 DIAGNOSIS — J9610 Chronic respiratory failure, unspecified whether with hypoxia or hypercapnia: Secondary | ICD-10-CM | POA: Diagnosis not present

## 2015-07-05 DIAGNOSIS — I2510 Atherosclerotic heart disease of native coronary artery without angina pectoris: Secondary | ICD-10-CM | POA: Diagnosis not present

## 2015-07-05 DIAGNOSIS — I252 Old myocardial infarction: Secondary | ICD-10-CM | POA: Diagnosis not present

## 2015-07-05 DIAGNOSIS — J449 Chronic obstructive pulmonary disease, unspecified: Secondary | ICD-10-CM | POA: Diagnosis not present

## 2015-07-05 DIAGNOSIS — M549 Dorsalgia, unspecified: Secondary | ICD-10-CM | POA: Diagnosis not present

## 2015-07-05 DIAGNOSIS — I69351 Hemiplegia and hemiparesis following cerebral infarction affecting right dominant side: Secondary | ICD-10-CM | POA: Diagnosis not present

## 2015-07-06 DIAGNOSIS — I2510 Atherosclerotic heart disease of native coronary artery without angina pectoris: Secondary | ICD-10-CM | POA: Diagnosis not present

## 2015-07-06 DIAGNOSIS — J9610 Chronic respiratory failure, unspecified whether with hypoxia or hypercapnia: Secondary | ICD-10-CM | POA: Diagnosis not present

## 2015-07-06 DIAGNOSIS — I69351 Hemiplegia and hemiparesis following cerebral infarction affecting right dominant side: Secondary | ICD-10-CM | POA: Diagnosis not present

## 2015-07-06 DIAGNOSIS — M549 Dorsalgia, unspecified: Secondary | ICD-10-CM | POA: Diagnosis not present

## 2015-07-06 DIAGNOSIS — I252 Old myocardial infarction: Secondary | ICD-10-CM | POA: Diagnosis not present

## 2015-07-06 DIAGNOSIS — J449 Chronic obstructive pulmonary disease, unspecified: Secondary | ICD-10-CM | POA: Diagnosis not present

## 2015-07-12 DIAGNOSIS — M549 Dorsalgia, unspecified: Secondary | ICD-10-CM | POA: Diagnosis not present

## 2015-07-12 DIAGNOSIS — I2510 Atherosclerotic heart disease of native coronary artery without angina pectoris: Secondary | ICD-10-CM | POA: Diagnosis not present

## 2015-07-12 DIAGNOSIS — J9610 Chronic respiratory failure, unspecified whether with hypoxia or hypercapnia: Secondary | ICD-10-CM | POA: Diagnosis not present

## 2015-07-12 DIAGNOSIS — I69351 Hemiplegia and hemiparesis following cerebral infarction affecting right dominant side: Secondary | ICD-10-CM | POA: Diagnosis not present

## 2015-07-12 DIAGNOSIS — I252 Old myocardial infarction: Secondary | ICD-10-CM | POA: Diagnosis not present

## 2015-07-12 DIAGNOSIS — J449 Chronic obstructive pulmonary disease, unspecified: Secondary | ICD-10-CM | POA: Diagnosis not present

## 2015-07-14 DIAGNOSIS — I2510 Atherosclerotic heart disease of native coronary artery without angina pectoris: Secondary | ICD-10-CM | POA: Diagnosis not present

## 2015-07-14 DIAGNOSIS — I69351 Hemiplegia and hemiparesis following cerebral infarction affecting right dominant side: Secondary | ICD-10-CM | POA: Diagnosis not present

## 2015-07-14 DIAGNOSIS — I252 Old myocardial infarction: Secondary | ICD-10-CM | POA: Diagnosis not present

## 2015-07-14 DIAGNOSIS — J449 Chronic obstructive pulmonary disease, unspecified: Secondary | ICD-10-CM | POA: Diagnosis not present

## 2015-07-14 DIAGNOSIS — M549 Dorsalgia, unspecified: Secondary | ICD-10-CM | POA: Diagnosis not present

## 2015-07-14 DIAGNOSIS — J9610 Chronic respiratory failure, unspecified whether with hypoxia or hypercapnia: Secondary | ICD-10-CM | POA: Diagnosis not present

## 2015-07-19 DIAGNOSIS — J9610 Chronic respiratory failure, unspecified whether with hypoxia or hypercapnia: Secondary | ICD-10-CM | POA: Diagnosis not present

## 2015-07-19 DIAGNOSIS — I69351 Hemiplegia and hemiparesis following cerebral infarction affecting right dominant side: Secondary | ICD-10-CM | POA: Diagnosis not present

## 2015-07-19 DIAGNOSIS — M549 Dorsalgia, unspecified: Secondary | ICD-10-CM | POA: Diagnosis not present

## 2015-07-19 DIAGNOSIS — I252 Old myocardial infarction: Secondary | ICD-10-CM | POA: Diagnosis not present

## 2015-07-19 DIAGNOSIS — J449 Chronic obstructive pulmonary disease, unspecified: Secondary | ICD-10-CM | POA: Diagnosis not present

## 2015-07-19 DIAGNOSIS — I2510 Atherosclerotic heart disease of native coronary artery without angina pectoris: Secondary | ICD-10-CM | POA: Diagnosis not present

## 2015-07-27 DIAGNOSIS — I2510 Atherosclerotic heart disease of native coronary artery without angina pectoris: Secondary | ICD-10-CM | POA: Diagnosis not present

## 2015-07-27 DIAGNOSIS — J449 Chronic obstructive pulmonary disease, unspecified: Secondary | ICD-10-CM | POA: Diagnosis not present

## 2015-07-27 DIAGNOSIS — M549 Dorsalgia, unspecified: Secondary | ICD-10-CM | POA: Diagnosis not present

## 2015-07-27 DIAGNOSIS — J9610 Chronic respiratory failure, unspecified whether with hypoxia or hypercapnia: Secondary | ICD-10-CM | POA: Diagnosis not present

## 2015-07-27 DIAGNOSIS — I69351 Hemiplegia and hemiparesis following cerebral infarction affecting right dominant side: Secondary | ICD-10-CM | POA: Diagnosis not present

## 2015-07-27 DIAGNOSIS — I252 Old myocardial infarction: Secondary | ICD-10-CM | POA: Diagnosis not present

## 2015-07-29 DIAGNOSIS — I2510 Atherosclerotic heart disease of native coronary artery without angina pectoris: Secondary | ICD-10-CM | POA: Diagnosis not present

## 2015-07-29 DIAGNOSIS — J449 Chronic obstructive pulmonary disease, unspecified: Secondary | ICD-10-CM | POA: Diagnosis not present

## 2015-07-29 DIAGNOSIS — I69351 Hemiplegia and hemiparesis following cerebral infarction affecting right dominant side: Secondary | ICD-10-CM | POA: Diagnosis not present

## 2015-07-29 DIAGNOSIS — M549 Dorsalgia, unspecified: Secondary | ICD-10-CM | POA: Diagnosis not present

## 2015-07-29 DIAGNOSIS — I252 Old myocardial infarction: Secondary | ICD-10-CM | POA: Diagnosis not present

## 2015-07-29 DIAGNOSIS — J9610 Chronic respiratory failure, unspecified whether with hypoxia or hypercapnia: Secondary | ICD-10-CM | POA: Diagnosis not present

## 2015-08-03 DIAGNOSIS — M549 Dorsalgia, unspecified: Secondary | ICD-10-CM | POA: Diagnosis not present

## 2015-08-03 DIAGNOSIS — N183 Chronic kidney disease, stage 3 (moderate): Secondary | ICD-10-CM | POA: Diagnosis not present

## 2015-08-03 DIAGNOSIS — I252 Old myocardial infarction: Secondary | ICD-10-CM | POA: Diagnosis not present

## 2015-08-03 DIAGNOSIS — J9610 Chronic respiratory failure, unspecified whether with hypoxia or hypercapnia: Secondary | ICD-10-CM | POA: Diagnosis not present

## 2015-08-03 DIAGNOSIS — E039 Hypothyroidism, unspecified: Secondary | ICD-10-CM | POA: Diagnosis not present

## 2015-08-03 DIAGNOSIS — E538 Deficiency of other specified B group vitamins: Secondary | ICD-10-CM | POA: Diagnosis not present

## 2015-08-03 DIAGNOSIS — J449 Chronic obstructive pulmonary disease, unspecified: Secondary | ICD-10-CM | POA: Diagnosis not present

## 2015-08-03 DIAGNOSIS — W19XXXD Unspecified fall, subsequent encounter: Secondary | ICD-10-CM | POA: Diagnosis not present

## 2015-08-03 DIAGNOSIS — I2510 Atherosclerotic heart disease of native coronary artery without angina pectoris: Secondary | ICD-10-CM | POA: Diagnosis not present

## 2015-08-03 DIAGNOSIS — G4489 Other headache syndrome: Secondary | ICD-10-CM | POA: Diagnosis not present

## 2015-08-03 DIAGNOSIS — I69351 Hemiplegia and hemiparesis following cerebral infarction affecting right dominant side: Secondary | ICD-10-CM | POA: Diagnosis not present

## 2015-08-04 DIAGNOSIS — M549 Dorsalgia, unspecified: Secondary | ICD-10-CM | POA: Diagnosis not present

## 2015-08-04 DIAGNOSIS — J449 Chronic obstructive pulmonary disease, unspecified: Secondary | ICD-10-CM | POA: Diagnosis not present

## 2015-08-04 DIAGNOSIS — J9610 Chronic respiratory failure, unspecified whether with hypoxia or hypercapnia: Secondary | ICD-10-CM | POA: Diagnosis not present

## 2015-08-04 DIAGNOSIS — I252 Old myocardial infarction: Secondary | ICD-10-CM | POA: Diagnosis not present

## 2015-08-04 DIAGNOSIS — I69351 Hemiplegia and hemiparesis following cerebral infarction affecting right dominant side: Secondary | ICD-10-CM | POA: Diagnosis not present

## 2015-08-04 DIAGNOSIS — I2510 Atherosclerotic heart disease of native coronary artery without angina pectoris: Secondary | ICD-10-CM | POA: Diagnosis not present

## 2015-08-06 DIAGNOSIS — R2681 Unsteadiness on feet: Secondary | ICD-10-CM | POA: Diagnosis not present

## 2015-08-06 DIAGNOSIS — M25561 Pain in right knee: Secondary | ICD-10-CM | POA: Diagnosis not present

## 2015-08-06 DIAGNOSIS — R4189 Other symptoms and signs involving cognitive functions and awareness: Secondary | ICD-10-CM | POA: Diagnosis not present

## 2015-08-06 DIAGNOSIS — M25562 Pain in left knee: Secondary | ICD-10-CM | POA: Diagnosis not present

## 2015-08-11 DIAGNOSIS — M25561 Pain in right knee: Secondary | ICD-10-CM | POA: Diagnosis not present

## 2015-08-11 DIAGNOSIS — J449 Chronic obstructive pulmonary disease, unspecified: Secondary | ICD-10-CM | POA: Diagnosis not present

## 2015-08-11 DIAGNOSIS — R2681 Unsteadiness on feet: Secondary | ICD-10-CM | POA: Diagnosis not present

## 2015-08-11 DIAGNOSIS — M25562 Pain in left knee: Secondary | ICD-10-CM | POA: Diagnosis not present

## 2015-08-11 DIAGNOSIS — R4189 Other symptoms and signs involving cognitive functions and awareness: Secondary | ICD-10-CM | POA: Diagnosis not present

## 2015-08-11 DIAGNOSIS — E119 Type 2 diabetes mellitus without complications: Secondary | ICD-10-CM | POA: Diagnosis not present

## 2015-08-12 DIAGNOSIS — R2681 Unsteadiness on feet: Secondary | ICD-10-CM | POA: Diagnosis not present

## 2015-08-12 DIAGNOSIS — E119 Type 2 diabetes mellitus without complications: Secondary | ICD-10-CM | POA: Diagnosis not present

## 2015-08-12 DIAGNOSIS — J449 Chronic obstructive pulmonary disease, unspecified: Secondary | ICD-10-CM | POA: Diagnosis not present

## 2015-08-12 DIAGNOSIS — M25561 Pain in right knee: Secondary | ICD-10-CM | POA: Diagnosis not present

## 2015-08-12 DIAGNOSIS — R4189 Other symptoms and signs involving cognitive functions and awareness: Secondary | ICD-10-CM | POA: Diagnosis not present

## 2015-08-12 DIAGNOSIS — M25562 Pain in left knee: Secondary | ICD-10-CM | POA: Diagnosis not present

## 2015-08-17 DIAGNOSIS — J449 Chronic obstructive pulmonary disease, unspecified: Secondary | ICD-10-CM | POA: Diagnosis not present

## 2015-08-17 DIAGNOSIS — M25562 Pain in left knee: Secondary | ICD-10-CM | POA: Diagnosis not present

## 2015-08-17 DIAGNOSIS — E119 Type 2 diabetes mellitus without complications: Secondary | ICD-10-CM | POA: Diagnosis not present

## 2015-08-17 DIAGNOSIS — M25561 Pain in right knee: Secondary | ICD-10-CM | POA: Diagnosis not present

## 2015-08-17 DIAGNOSIS — R2681 Unsteadiness on feet: Secondary | ICD-10-CM | POA: Diagnosis not present

## 2015-08-17 DIAGNOSIS — R4189 Other symptoms and signs involving cognitive functions and awareness: Secondary | ICD-10-CM | POA: Diagnosis not present

## 2015-08-19 DIAGNOSIS — M25561 Pain in right knee: Secondary | ICD-10-CM | POA: Diagnosis not present

## 2015-08-19 DIAGNOSIS — E119 Type 2 diabetes mellitus without complications: Secondary | ICD-10-CM | POA: Diagnosis not present

## 2015-08-19 DIAGNOSIS — R4189 Other symptoms and signs involving cognitive functions and awareness: Secondary | ICD-10-CM | POA: Diagnosis not present

## 2015-08-19 DIAGNOSIS — J449 Chronic obstructive pulmonary disease, unspecified: Secondary | ICD-10-CM | POA: Diagnosis not present

## 2015-08-19 DIAGNOSIS — R2681 Unsteadiness on feet: Secondary | ICD-10-CM | POA: Diagnosis not present

## 2015-08-19 DIAGNOSIS — M25562 Pain in left knee: Secondary | ICD-10-CM | POA: Diagnosis not present

## 2015-08-23 DIAGNOSIS — R4189 Other symptoms and signs involving cognitive functions and awareness: Secondary | ICD-10-CM | POA: Diagnosis not present

## 2015-08-23 DIAGNOSIS — N184 Chronic kidney disease, stage 4 (severe): Secondary | ICD-10-CM | POA: Diagnosis not present

## 2015-08-23 DIAGNOSIS — M25561 Pain in right knee: Secondary | ICD-10-CM | POA: Diagnosis not present

## 2015-08-23 DIAGNOSIS — R2681 Unsteadiness on feet: Secondary | ICD-10-CM | POA: Diagnosis not present

## 2015-08-23 DIAGNOSIS — M25562 Pain in left knee: Secondary | ICD-10-CM | POA: Diagnosis not present

## 2015-08-23 DIAGNOSIS — E119 Type 2 diabetes mellitus without complications: Secondary | ICD-10-CM | POA: Diagnosis not present

## 2015-08-23 DIAGNOSIS — J449 Chronic obstructive pulmonary disease, unspecified: Secondary | ICD-10-CM | POA: Diagnosis not present

## 2015-08-25 DIAGNOSIS — M25562 Pain in left knee: Secondary | ICD-10-CM | POA: Diagnosis not present

## 2015-08-25 DIAGNOSIS — M25561 Pain in right knee: Secondary | ICD-10-CM | POA: Diagnosis not present

## 2015-08-25 DIAGNOSIS — R4189 Other symptoms and signs involving cognitive functions and awareness: Secondary | ICD-10-CM | POA: Diagnosis not present

## 2015-08-25 DIAGNOSIS — J449 Chronic obstructive pulmonary disease, unspecified: Secondary | ICD-10-CM | POA: Diagnosis not present

## 2015-08-25 DIAGNOSIS — R2681 Unsteadiness on feet: Secondary | ICD-10-CM | POA: Diagnosis not present

## 2015-08-25 DIAGNOSIS — E119 Type 2 diabetes mellitus without complications: Secondary | ICD-10-CM | POA: Diagnosis not present

## 2015-08-31 DIAGNOSIS — J449 Chronic obstructive pulmonary disease, unspecified: Secondary | ICD-10-CM | POA: Diagnosis not present

## 2015-08-31 DIAGNOSIS — M25561 Pain in right knee: Secondary | ICD-10-CM | POA: Diagnosis not present

## 2015-08-31 DIAGNOSIS — M25562 Pain in left knee: Secondary | ICD-10-CM | POA: Diagnosis not present

## 2015-08-31 DIAGNOSIS — R2681 Unsteadiness on feet: Secondary | ICD-10-CM | POA: Diagnosis not present

## 2015-08-31 DIAGNOSIS — R4189 Other symptoms and signs involving cognitive functions and awareness: Secondary | ICD-10-CM | POA: Diagnosis not present

## 2015-08-31 DIAGNOSIS — E119 Type 2 diabetes mellitus without complications: Secondary | ICD-10-CM | POA: Diagnosis not present

## 2015-09-01 DIAGNOSIS — R4189 Other symptoms and signs involving cognitive functions and awareness: Secondary | ICD-10-CM | POA: Diagnosis not present

## 2015-09-01 DIAGNOSIS — J449 Chronic obstructive pulmonary disease, unspecified: Secondary | ICD-10-CM | POA: Diagnosis not present

## 2015-09-01 DIAGNOSIS — M25561 Pain in right knee: Secondary | ICD-10-CM | POA: Diagnosis not present

## 2015-09-01 DIAGNOSIS — E119 Type 2 diabetes mellitus without complications: Secondary | ICD-10-CM | POA: Diagnosis not present

## 2015-09-01 DIAGNOSIS — R2681 Unsteadiness on feet: Secondary | ICD-10-CM | POA: Diagnosis not present

## 2015-09-01 DIAGNOSIS — M25562 Pain in left knee: Secondary | ICD-10-CM | POA: Diagnosis not present

## 2015-09-05 DIAGNOSIS — E538 Deficiency of other specified B group vitamins: Secondary | ICD-10-CM | POA: Diagnosis not present

## 2015-09-20 DIAGNOSIS — I63312 Cerebral infarction due to thrombosis of left middle cerebral artery: Secondary | ICD-10-CM | POA: Diagnosis not present

## 2015-09-20 DIAGNOSIS — R413 Other amnesia: Secondary | ICD-10-CM | POA: Diagnosis not present

## 2015-09-20 DIAGNOSIS — G44209 Tension-type headache, unspecified, not intractable: Secondary | ICD-10-CM | POA: Diagnosis not present

## 2015-09-20 DIAGNOSIS — G253 Myoclonus: Secondary | ICD-10-CM | POA: Diagnosis not present

## 2015-09-20 DIAGNOSIS — G44009 Cluster headache syndrome, unspecified, not intractable: Secondary | ICD-10-CM | POA: Diagnosis not present

## 2015-10-05 DIAGNOSIS — R410 Disorientation, unspecified: Secondary | ICD-10-CM | POA: Diagnosis not present

## 2015-10-10 DIAGNOSIS — E538 Deficiency of other specified B group vitamins: Secondary | ICD-10-CM | POA: Diagnosis not present

## 2015-11-10 DIAGNOSIS — E538 Deficiency of other specified B group vitamins: Secondary | ICD-10-CM | POA: Diagnosis not present

## 2015-11-10 DIAGNOSIS — E119 Type 2 diabetes mellitus without complications: Secondary | ICD-10-CM | POA: Diagnosis not present

## 2015-11-10 DIAGNOSIS — L309 Dermatitis, unspecified: Secondary | ICD-10-CM | POA: Diagnosis not present

## 2015-11-17 DIAGNOSIS — N184 Chronic kidney disease, stage 4 (severe): Secondary | ICD-10-CM | POA: Diagnosis not present

## 2015-11-22 DIAGNOSIS — G44009 Cluster headache syndrome, unspecified, not intractable: Secondary | ICD-10-CM | POA: Diagnosis not present

## 2015-11-22 DIAGNOSIS — R55 Syncope and collapse: Secondary | ICD-10-CM | POA: Diagnosis not present

## 2015-11-22 DIAGNOSIS — Z9181 History of falling: Secondary | ICD-10-CM | POA: Insufficient documentation

## 2015-11-22 DIAGNOSIS — R413 Other amnesia: Secondary | ICD-10-CM | POA: Diagnosis not present

## 2015-11-23 DIAGNOSIS — E119 Type 2 diabetes mellitus without complications: Secondary | ICD-10-CM | POA: Diagnosis not present

## 2015-11-23 DIAGNOSIS — N184 Chronic kidney disease, stage 4 (severe): Secondary | ICD-10-CM | POA: Diagnosis not present

## 2015-11-23 DIAGNOSIS — N2889 Other specified disorders of kidney and ureter: Secondary | ICD-10-CM | POA: Diagnosis not present

## 2015-12-13 DIAGNOSIS — E538 Deficiency of other specified B group vitamins: Secondary | ICD-10-CM | POA: Diagnosis not present

## 2015-12-13 DIAGNOSIS — Z1231 Encounter for screening mammogram for malignant neoplasm of breast: Secondary | ICD-10-CM | POA: Diagnosis not present

## 2016-01-04 DIAGNOSIS — R55 Syncope and collapse: Secondary | ICD-10-CM | POA: Diagnosis not present

## 2016-01-10 DIAGNOSIS — G44209 Tension-type headache, unspecified, not intractable: Secondary | ICD-10-CM

## 2016-01-10 DIAGNOSIS — R413 Other amnesia: Secondary | ICD-10-CM | POA: Diagnosis not present

## 2016-01-10 DIAGNOSIS — R55 Syncope and collapse: Secondary | ICD-10-CM | POA: Insufficient documentation

## 2016-01-10 DIAGNOSIS — G253 Myoclonus: Secondary | ICD-10-CM | POA: Insufficient documentation

## 2016-01-10 DIAGNOSIS — G44009 Cluster headache syndrome, unspecified, not intractable: Secondary | ICD-10-CM | POA: Insufficient documentation

## 2016-01-10 HISTORY — DX: Myoclonus: G25.3

## 2016-01-10 HISTORY — DX: Tension-type headache, unspecified, not intractable: G44.209

## 2016-01-16 DIAGNOSIS — E538 Deficiency of other specified B group vitamins: Secondary | ICD-10-CM | POA: Diagnosis not present

## 2016-02-20 DIAGNOSIS — I4891 Unspecified atrial fibrillation: Secondary | ICD-10-CM | POA: Diagnosis not present

## 2016-02-20 DIAGNOSIS — I10 Essential (primary) hypertension: Secondary | ICD-10-CM | POA: Diagnosis not present

## 2016-02-20 DIAGNOSIS — G43909 Migraine, unspecified, not intractable, without status migrainosus: Secondary | ICD-10-CM | POA: Diagnosis not present

## 2016-02-20 DIAGNOSIS — Z23 Encounter for immunization: Secondary | ICD-10-CM | POA: Diagnosis not present

## 2016-02-20 DIAGNOSIS — N390 Urinary tract infection, site not specified: Secondary | ICD-10-CM | POA: Diagnosis not present

## 2016-02-20 DIAGNOSIS — E538 Deficiency of other specified B group vitamins: Secondary | ICD-10-CM | POA: Diagnosis not present

## 2016-02-20 DIAGNOSIS — R300 Dysuria: Secondary | ICD-10-CM | POA: Diagnosis not present

## 2016-02-21 DIAGNOSIS — R251 Tremor, unspecified: Secondary | ICD-10-CM | POA: Diagnosis not present

## 2016-02-21 DIAGNOSIS — G44209 Tension-type headache, unspecified, not intractable: Secondary | ICD-10-CM | POA: Diagnosis not present

## 2016-02-21 DIAGNOSIS — G44009 Cluster headache syndrome, unspecified, not intractable: Secondary | ICD-10-CM | POA: Diagnosis not present

## 2016-02-21 DIAGNOSIS — R55 Syncope and collapse: Secondary | ICD-10-CM | POA: Diagnosis not present

## 2016-02-21 DIAGNOSIS — Z9181 History of falling: Secondary | ICD-10-CM | POA: Diagnosis not present

## 2016-02-21 DIAGNOSIS — R413 Other amnesia: Secondary | ICD-10-CM | POA: Diagnosis not present

## 2016-03-19 DIAGNOSIS — N184 Chronic kidney disease, stage 4 (severe): Secondary | ICD-10-CM | POA: Diagnosis not present

## 2016-03-22 DIAGNOSIS — E538 Deficiency of other specified B group vitamins: Secondary | ICD-10-CM | POA: Diagnosis not present

## 2016-03-27 DIAGNOSIS — G44209 Tension-type headache, unspecified, not intractable: Secondary | ICD-10-CM | POA: Diagnosis not present

## 2016-03-27 DIAGNOSIS — N2889 Other specified disorders of kidney and ureter: Secondary | ICD-10-CM | POA: Diagnosis not present

## 2016-03-27 DIAGNOSIS — E119 Type 2 diabetes mellitus without complications: Secondary | ICD-10-CM | POA: Diagnosis not present

## 2016-03-27 DIAGNOSIS — N390 Urinary tract infection, site not specified: Secondary | ICD-10-CM | POA: Insufficient documentation

## 2016-03-27 DIAGNOSIS — G44009 Cluster headache syndrome, unspecified, not intractable: Secondary | ICD-10-CM | POA: Diagnosis not present

## 2016-03-27 DIAGNOSIS — Z9181 History of falling: Secondary | ICD-10-CM | POA: Diagnosis not present

## 2016-03-27 DIAGNOSIS — R55 Syncope and collapse: Secondary | ICD-10-CM | POA: Diagnosis not present

## 2016-03-27 DIAGNOSIS — G450 Vertebro-basilar artery syndrome: Secondary | ICD-10-CM | POA: Diagnosis not present

## 2016-03-27 DIAGNOSIS — N184 Chronic kidney disease, stage 4 (severe): Secondary | ICD-10-CM | POA: Diagnosis not present

## 2016-03-27 DIAGNOSIS — R413 Other amnesia: Secondary | ICD-10-CM | POA: Diagnosis not present

## 2016-04-24 DIAGNOSIS — E538 Deficiency of other specified B group vitamins: Secondary | ICD-10-CM | POA: Diagnosis not present

## 2016-05-24 DIAGNOSIS — E039 Hypothyroidism, unspecified: Secondary | ICD-10-CM | POA: Diagnosis not present

## 2016-05-24 DIAGNOSIS — G43809 Other migraine, not intractable, without status migrainosus: Secondary | ICD-10-CM | POA: Diagnosis not present

## 2016-05-24 DIAGNOSIS — I4891 Unspecified atrial fibrillation: Secondary | ICD-10-CM | POA: Diagnosis not present

## 2016-05-24 DIAGNOSIS — I25810 Atherosclerosis of coronary artery bypass graft(s) without angina pectoris: Secondary | ICD-10-CM | POA: Diagnosis not present

## 2016-05-24 DIAGNOSIS — N183 Chronic kidney disease, stage 3 (moderate): Secondary | ICD-10-CM | POA: Diagnosis not present

## 2016-05-24 DIAGNOSIS — Z1389 Encounter for screening for other disorder: Secondary | ICD-10-CM | POA: Diagnosis not present

## 2016-05-24 DIAGNOSIS — I679 Cerebrovascular disease, unspecified: Secondary | ICD-10-CM | POA: Diagnosis not present

## 2016-05-24 DIAGNOSIS — Z0000 Encounter for general adult medical examination without abnormal findings: Secondary | ICD-10-CM | POA: Diagnosis not present

## 2016-05-24 DIAGNOSIS — J449 Chronic obstructive pulmonary disease, unspecified: Secondary | ICD-10-CM | POA: Diagnosis not present

## 2016-05-24 DIAGNOSIS — E538 Deficiency of other specified B group vitamins: Secondary | ICD-10-CM | POA: Diagnosis not present

## 2016-05-24 DIAGNOSIS — E7800 Pure hypercholesterolemia, unspecified: Secondary | ICD-10-CM | POA: Diagnosis not present

## 2016-05-24 DIAGNOSIS — R739 Hyperglycemia, unspecified: Secondary | ICD-10-CM | POA: Diagnosis not present

## 2016-05-28 DIAGNOSIS — S8392XA Sprain of unspecified site of left knee, initial encounter: Secondary | ICD-10-CM | POA: Diagnosis not present

## 2016-05-28 DIAGNOSIS — R222 Localized swelling, mass and lump, trunk: Secondary | ICD-10-CM | POA: Diagnosis not present

## 2016-05-28 DIAGNOSIS — M79605 Pain in left leg: Secondary | ICD-10-CM | POA: Diagnosis not present

## 2016-05-28 DIAGNOSIS — S0990XA Unspecified injury of head, initial encounter: Secondary | ICD-10-CM | POA: Diagnosis not present

## 2016-05-28 DIAGNOSIS — R102 Pelvic and perineal pain: Secondary | ICD-10-CM | POA: Diagnosis not present

## 2016-05-28 DIAGNOSIS — R569 Unspecified convulsions: Secondary | ICD-10-CM | POA: Diagnosis not present

## 2016-05-28 DIAGNOSIS — M25474 Effusion, right foot: Secondary | ICD-10-CM | POA: Diagnosis not present

## 2016-05-28 DIAGNOSIS — M7989 Other specified soft tissue disorders: Secondary | ICD-10-CM | POA: Diagnosis not present

## 2016-05-28 DIAGNOSIS — S3992XA Unspecified injury of lower back, initial encounter: Secondary | ICD-10-CM | POA: Diagnosis not present

## 2016-05-28 DIAGNOSIS — M25462 Effusion, left knee: Secondary | ICD-10-CM | POA: Diagnosis not present

## 2016-05-28 DIAGNOSIS — T148XXA Other injury of unspecified body region, initial encounter: Secondary | ICD-10-CM | POA: Diagnosis not present

## 2016-06-05 DIAGNOSIS — S8002XA Contusion of left knee, initial encounter: Secondary | ICD-10-CM | POA: Diagnosis not present

## 2016-06-12 DIAGNOSIS — E538 Deficiency of other specified B group vitamins: Secondary | ICD-10-CM | POA: Diagnosis not present

## 2016-06-25 DIAGNOSIS — N184 Chronic kidney disease, stage 4 (severe): Secondary | ICD-10-CM | POA: Diagnosis not present

## 2016-06-26 DIAGNOSIS — N2889 Other specified disorders of kidney and ureter: Secondary | ICD-10-CM | POA: Diagnosis not present

## 2016-06-26 DIAGNOSIS — E119 Type 2 diabetes mellitus without complications: Secondary | ICD-10-CM | POA: Diagnosis not present

## 2016-06-26 DIAGNOSIS — N184 Chronic kidney disease, stage 4 (severe): Secondary | ICD-10-CM | POA: Diagnosis not present

## 2016-06-27 DIAGNOSIS — R945 Abnormal results of liver function studies: Secondary | ICD-10-CM | POA: Diagnosis not present

## 2016-07-13 DIAGNOSIS — E538 Deficiency of other specified B group vitamins: Secondary | ICD-10-CM | POA: Diagnosis not present

## 2016-08-28 DIAGNOSIS — E7800 Pure hypercholesterolemia, unspecified: Secondary | ICD-10-CM | POA: Diagnosis not present

## 2016-08-28 DIAGNOSIS — E538 Deficiency of other specified B group vitamins: Secondary | ICD-10-CM | POA: Diagnosis not present

## 2016-09-18 DIAGNOSIS — J209 Acute bronchitis, unspecified: Secondary | ICD-10-CM | POA: Diagnosis not present

## 2016-09-27 DIAGNOSIS — I129 Hypertensive chronic kidney disease with stage 1 through stage 4 chronic kidney disease, or unspecified chronic kidney disease: Secondary | ICD-10-CM | POA: Diagnosis not present

## 2016-09-27 DIAGNOSIS — I10 Essential (primary) hypertension: Secondary | ICD-10-CM | POA: Diagnosis not present

## 2016-09-27 DIAGNOSIS — E46 Unspecified protein-calorie malnutrition: Secondary | ICD-10-CM | POA: Diagnosis not present

## 2016-09-27 DIAGNOSIS — E440 Moderate protein-calorie malnutrition: Secondary | ICD-10-CM | POA: Diagnosis not present

## 2016-09-27 DIAGNOSIS — R109 Unspecified abdominal pain: Secondary | ICD-10-CM | POA: Diagnosis not present

## 2016-09-27 DIAGNOSIS — I482 Chronic atrial fibrillation: Secondary | ICD-10-CM | POA: Diagnosis not present

## 2016-09-27 DIAGNOSIS — K859 Acute pancreatitis without necrosis or infection, unspecified: Secondary | ICD-10-CM | POA: Diagnosis not present

## 2016-09-27 DIAGNOSIS — N183 Chronic kidney disease, stage 3 (moderate): Secondary | ICD-10-CM | POA: Diagnosis not present

## 2016-09-27 DIAGNOSIS — I69951 Hemiplegia and hemiparesis following unspecified cerebrovascular disease affecting right dominant side: Secondary | ICD-10-CM | POA: Diagnosis not present

## 2016-09-27 DIAGNOSIS — E039 Hypothyroidism, unspecified: Secondary | ICD-10-CM | POA: Diagnosis not present

## 2016-09-27 DIAGNOSIS — Z86711 Personal history of pulmonary embolism: Secondary | ICD-10-CM | POA: Diagnosis not present

## 2016-09-27 DIAGNOSIS — R51 Headache: Secondary | ICD-10-CM | POA: Diagnosis not present

## 2016-09-27 DIAGNOSIS — M179 Osteoarthritis of knee, unspecified: Secondary | ICD-10-CM | POA: Diagnosis not present

## 2016-09-27 DIAGNOSIS — K200 Eosinophilic esophagitis: Secondary | ICD-10-CM | POA: Diagnosis not present

## 2016-09-27 DIAGNOSIS — I25810 Atherosclerosis of coronary artery bypass graft(s) without angina pectoris: Secondary | ICD-10-CM | POA: Diagnosis not present

## 2016-09-27 DIAGNOSIS — Z8673 Personal history of transient ischemic attack (TIA), and cerebral infarction without residual deficits: Secondary | ICD-10-CM | POA: Diagnosis not present

## 2016-09-27 DIAGNOSIS — R410 Disorientation, unspecified: Secondary | ICD-10-CM | POA: Diagnosis not present

## 2016-09-27 DIAGNOSIS — R404 Transient alteration of awareness: Secondary | ICD-10-CM | POA: Diagnosis not present

## 2016-09-27 DIAGNOSIS — J9600 Acute respiratory failure, unspecified whether with hypoxia or hypercapnia: Secondary | ICD-10-CM | POA: Diagnosis not present

## 2016-09-27 DIAGNOSIS — G9341 Metabolic encephalopathy: Secondary | ICD-10-CM | POA: Diagnosis not present

## 2016-09-27 DIAGNOSIS — E778 Other disorders of glycoprotein metabolism: Secondary | ICD-10-CM | POA: Diagnosis not present

## 2016-09-27 DIAGNOSIS — R1084 Generalized abdominal pain: Secondary | ICD-10-CM | POA: Diagnosis not present

## 2016-09-28 DIAGNOSIS — I482 Chronic atrial fibrillation: Secondary | ICD-10-CM | POA: Diagnosis not present

## 2016-09-28 DIAGNOSIS — Z8673 Personal history of transient ischemic attack (TIA), and cerebral infarction without residual deficits: Secondary | ICD-10-CM | POA: Diagnosis not present

## 2016-09-28 DIAGNOSIS — M179 Osteoarthritis of knee, unspecified: Secondary | ICD-10-CM | POA: Diagnosis not present

## 2016-09-28 DIAGNOSIS — R410 Disorientation, unspecified: Secondary | ICD-10-CM | POA: Diagnosis not present

## 2016-09-28 DIAGNOSIS — I10 Essential (primary) hypertension: Secondary | ICD-10-CM | POA: Diagnosis not present

## 2016-09-28 DIAGNOSIS — E778 Other disorders of glycoprotein metabolism: Secondary | ICD-10-CM | POA: Diagnosis not present

## 2016-09-28 DIAGNOSIS — E46 Unspecified protein-calorie malnutrition: Secondary | ICD-10-CM | POA: Diagnosis not present

## 2016-09-28 DIAGNOSIS — K859 Acute pancreatitis without necrosis or infection, unspecified: Secondary | ICD-10-CM | POA: Diagnosis not present

## 2016-09-28 DIAGNOSIS — N183 Chronic kidney disease, stage 3 (moderate): Secondary | ICD-10-CM | POA: Diagnosis not present

## 2016-09-28 DIAGNOSIS — Z86711 Personal history of pulmonary embolism: Secondary | ICD-10-CM | POA: Diagnosis not present

## 2016-10-01 DIAGNOSIS — E778 Other disorders of glycoprotein metabolism: Secondary | ICD-10-CM

## 2016-10-01 DIAGNOSIS — E46 Unspecified protein-calorie malnutrition: Secondary | ICD-10-CM

## 2016-10-05 DIAGNOSIS — K859 Acute pancreatitis without necrosis or infection, unspecified: Secondary | ICD-10-CM | POA: Diagnosis not present

## 2016-10-05 DIAGNOSIS — R5381 Other malaise: Secondary | ICD-10-CM | POA: Diagnosis not present

## 2016-10-06 DIAGNOSIS — R410 Disorientation, unspecified: Secondary | ICD-10-CM | POA: Diagnosis not present

## 2016-10-06 DIAGNOSIS — N183 Chronic kidney disease, stage 3 (moderate): Secondary | ICD-10-CM | POA: Diagnosis not present

## 2016-10-06 DIAGNOSIS — E039 Hypothyroidism, unspecified: Secondary | ICD-10-CM | POA: Diagnosis not present

## 2016-10-06 DIAGNOSIS — G43909 Migraine, unspecified, not intractable, without status migrainosus: Secondary | ICD-10-CM | POA: Diagnosis not present

## 2016-10-06 DIAGNOSIS — E785 Hyperlipidemia, unspecified: Secondary | ICD-10-CM | POA: Diagnosis not present

## 2016-10-06 DIAGNOSIS — E441 Mild protein-calorie malnutrition: Secondary | ICD-10-CM | POA: Diagnosis not present

## 2016-10-06 DIAGNOSIS — M179 Osteoarthritis of knee, unspecified: Secondary | ICD-10-CM | POA: Diagnosis not present

## 2016-10-06 DIAGNOSIS — E778 Other disorders of glycoprotein metabolism: Secondary | ICD-10-CM | POA: Diagnosis not present

## 2016-10-06 DIAGNOSIS — I482 Chronic atrial fibrillation: Secondary | ICD-10-CM | POA: Diagnosis not present

## 2016-10-06 DIAGNOSIS — K859 Acute pancreatitis without necrosis or infection, unspecified: Secondary | ICD-10-CM | POA: Diagnosis not present

## 2016-10-06 DIAGNOSIS — D649 Anemia, unspecified: Secondary | ICD-10-CM | POA: Diagnosis not present

## 2016-10-06 DIAGNOSIS — R319 Hematuria, unspecified: Secondary | ICD-10-CM | POA: Diagnosis not present

## 2016-10-06 DIAGNOSIS — I129 Hypertensive chronic kidney disease with stage 1 through stage 4 chronic kidney disease, or unspecified chronic kidney disease: Secondary | ICD-10-CM | POA: Diagnosis not present

## 2016-10-06 DIAGNOSIS — I10 Essential (primary) hypertension: Secondary | ICD-10-CM | POA: Diagnosis not present

## 2016-10-06 DIAGNOSIS — N390 Urinary tract infection, site not specified: Secondary | ICD-10-CM | POA: Diagnosis not present

## 2016-10-06 DIAGNOSIS — J449 Chronic obstructive pulmonary disease, unspecified: Secondary | ICD-10-CM | POA: Diagnosis not present

## 2016-10-10 DIAGNOSIS — G43909 Migraine, unspecified, not intractable, without status migrainosus: Secondary | ICD-10-CM | POA: Diagnosis not present

## 2016-10-10 DIAGNOSIS — R410 Disorientation, unspecified: Secondary | ICD-10-CM | POA: Diagnosis not present

## 2016-10-10 DIAGNOSIS — E441 Mild protein-calorie malnutrition: Secondary | ICD-10-CM | POA: Diagnosis not present

## 2016-10-10 DIAGNOSIS — K859 Acute pancreatitis without necrosis or infection, unspecified: Secondary | ICD-10-CM | POA: Diagnosis not present

## 2016-10-18 DIAGNOSIS — N183 Chronic kidney disease, stage 3 (moderate): Secondary | ICD-10-CM | POA: Diagnosis not present

## 2016-10-18 DIAGNOSIS — I482 Chronic atrial fibrillation: Secondary | ICD-10-CM | POA: Diagnosis not present

## 2016-10-18 DIAGNOSIS — M6281 Muscle weakness (generalized): Secondary | ICD-10-CM | POA: Diagnosis not present

## 2016-10-18 DIAGNOSIS — I129 Hypertensive chronic kidney disease with stage 1 through stage 4 chronic kidney disease, or unspecified chronic kidney disease: Secondary | ICD-10-CM | POA: Diagnosis not present

## 2016-10-18 DIAGNOSIS — R2689 Other abnormalities of gait and mobility: Secondary | ICD-10-CM | POA: Diagnosis not present

## 2016-10-18 DIAGNOSIS — F0391 Unspecified dementia with behavioral disturbance: Secondary | ICD-10-CM | POA: Diagnosis not present

## 2016-10-18 DIAGNOSIS — I2510 Atherosclerotic heart disease of native coronary artery without angina pectoris: Secondary | ICD-10-CM | POA: Diagnosis not present

## 2016-10-18 DIAGNOSIS — J449 Chronic obstructive pulmonary disease, unspecified: Secondary | ICD-10-CM | POA: Diagnosis not present

## 2016-10-18 DIAGNOSIS — E1122 Type 2 diabetes mellitus with diabetic chronic kidney disease: Secondary | ICD-10-CM | POA: Diagnosis not present

## 2016-10-22 DIAGNOSIS — R5381 Other malaise: Secondary | ICD-10-CM | POA: Diagnosis not present

## 2016-10-22 DIAGNOSIS — I951 Orthostatic hypotension: Secondary | ICD-10-CM | POA: Diagnosis not present

## 2016-11-06 DIAGNOSIS — I2510 Atherosclerotic heart disease of native coronary artery without angina pectoris: Secondary | ICD-10-CM | POA: Diagnosis not present

## 2016-11-06 DIAGNOSIS — M6281 Muscle weakness (generalized): Secondary | ICD-10-CM | POA: Diagnosis not present

## 2016-11-06 DIAGNOSIS — E1122 Type 2 diabetes mellitus with diabetic chronic kidney disease: Secondary | ICD-10-CM | POA: Diagnosis not present

## 2016-11-06 DIAGNOSIS — N183 Chronic kidney disease, stage 3 (moderate): Secondary | ICD-10-CM | POA: Diagnosis not present

## 2016-11-06 DIAGNOSIS — I482 Chronic atrial fibrillation: Secondary | ICD-10-CM | POA: Diagnosis not present

## 2016-11-06 DIAGNOSIS — I129 Hypertensive chronic kidney disease with stage 1 through stage 4 chronic kidney disease, or unspecified chronic kidney disease: Secondary | ICD-10-CM | POA: Diagnosis not present

## 2016-11-06 DIAGNOSIS — F0391 Unspecified dementia with behavioral disturbance: Secondary | ICD-10-CM | POA: Diagnosis not present

## 2016-11-06 DIAGNOSIS — R2689 Other abnormalities of gait and mobility: Secondary | ICD-10-CM | POA: Diagnosis not present

## 2016-11-06 DIAGNOSIS — J449 Chronic obstructive pulmonary disease, unspecified: Secondary | ICD-10-CM | POA: Diagnosis not present

## 2016-11-09 DIAGNOSIS — R2689 Other abnormalities of gait and mobility: Secondary | ICD-10-CM | POA: Diagnosis not present

## 2016-11-09 DIAGNOSIS — I129 Hypertensive chronic kidney disease with stage 1 through stage 4 chronic kidney disease, or unspecified chronic kidney disease: Secondary | ICD-10-CM | POA: Diagnosis not present

## 2016-11-09 DIAGNOSIS — I2510 Atherosclerotic heart disease of native coronary artery without angina pectoris: Secondary | ICD-10-CM | POA: Diagnosis not present

## 2016-11-09 DIAGNOSIS — F0391 Unspecified dementia with behavioral disturbance: Secondary | ICD-10-CM | POA: Diagnosis not present

## 2016-11-09 DIAGNOSIS — I482 Chronic atrial fibrillation: Secondary | ICD-10-CM | POA: Diagnosis not present

## 2016-11-09 DIAGNOSIS — J449 Chronic obstructive pulmonary disease, unspecified: Secondary | ICD-10-CM | POA: Diagnosis not present

## 2016-11-09 DIAGNOSIS — N183 Chronic kidney disease, stage 3 (moderate): Secondary | ICD-10-CM | POA: Diagnosis not present

## 2016-11-09 DIAGNOSIS — E1122 Type 2 diabetes mellitus with diabetic chronic kidney disease: Secondary | ICD-10-CM | POA: Diagnosis not present

## 2016-11-09 DIAGNOSIS — M6281 Muscle weakness (generalized): Secondary | ICD-10-CM | POA: Diagnosis not present

## 2016-11-16 DIAGNOSIS — F0391 Unspecified dementia with behavioral disturbance: Secondary | ICD-10-CM | POA: Diagnosis not present

## 2016-11-16 DIAGNOSIS — I129 Hypertensive chronic kidney disease with stage 1 through stage 4 chronic kidney disease, or unspecified chronic kidney disease: Secondary | ICD-10-CM | POA: Diagnosis not present

## 2016-11-16 DIAGNOSIS — E1122 Type 2 diabetes mellitus with diabetic chronic kidney disease: Secondary | ICD-10-CM | POA: Diagnosis not present

## 2016-11-16 DIAGNOSIS — J449 Chronic obstructive pulmonary disease, unspecified: Secondary | ICD-10-CM | POA: Diagnosis not present

## 2016-11-16 DIAGNOSIS — R2689 Other abnormalities of gait and mobility: Secondary | ICD-10-CM | POA: Diagnosis not present

## 2016-11-16 DIAGNOSIS — I482 Chronic atrial fibrillation: Secondary | ICD-10-CM | POA: Diagnosis not present

## 2016-11-16 DIAGNOSIS — M6281 Muscle weakness (generalized): Secondary | ICD-10-CM | POA: Diagnosis not present

## 2016-11-16 DIAGNOSIS — N183 Chronic kidney disease, stage 3 (moderate): Secondary | ICD-10-CM | POA: Diagnosis not present

## 2016-11-16 DIAGNOSIS — I2510 Atherosclerotic heart disease of native coronary artery without angina pectoris: Secondary | ICD-10-CM | POA: Diagnosis not present

## 2016-11-19 DIAGNOSIS — I2510 Atherosclerotic heart disease of native coronary artery without angina pectoris: Secondary | ICD-10-CM | POA: Diagnosis not present

## 2016-11-19 DIAGNOSIS — R2689 Other abnormalities of gait and mobility: Secondary | ICD-10-CM | POA: Diagnosis not present

## 2016-11-19 DIAGNOSIS — I129 Hypertensive chronic kidney disease with stage 1 through stage 4 chronic kidney disease, or unspecified chronic kidney disease: Secondary | ICD-10-CM | POA: Diagnosis not present

## 2016-11-19 DIAGNOSIS — N183 Chronic kidney disease, stage 3 (moderate): Secondary | ICD-10-CM | POA: Diagnosis not present

## 2016-11-19 DIAGNOSIS — E1122 Type 2 diabetes mellitus with diabetic chronic kidney disease: Secondary | ICD-10-CM | POA: Diagnosis not present

## 2016-11-19 DIAGNOSIS — M6281 Muscle weakness (generalized): Secondary | ICD-10-CM | POA: Diagnosis not present

## 2016-11-19 DIAGNOSIS — F0391 Unspecified dementia with behavioral disturbance: Secondary | ICD-10-CM | POA: Diagnosis not present

## 2016-11-19 DIAGNOSIS — N184 Chronic kidney disease, stage 4 (severe): Secondary | ICD-10-CM | POA: Diagnosis not present

## 2016-11-19 DIAGNOSIS — J449 Chronic obstructive pulmonary disease, unspecified: Secondary | ICD-10-CM | POA: Diagnosis not present

## 2016-11-19 DIAGNOSIS — I482 Chronic atrial fibrillation: Secondary | ICD-10-CM | POA: Diagnosis not present

## 2016-11-22 DIAGNOSIS — M6281 Muscle weakness (generalized): Secondary | ICD-10-CM | POA: Diagnosis not present

## 2016-11-22 DIAGNOSIS — I129 Hypertensive chronic kidney disease with stage 1 through stage 4 chronic kidney disease, or unspecified chronic kidney disease: Secondary | ICD-10-CM | POA: Diagnosis not present

## 2016-11-22 DIAGNOSIS — N183 Chronic kidney disease, stage 3 (moderate): Secondary | ICD-10-CM | POA: Diagnosis not present

## 2016-11-22 DIAGNOSIS — I482 Chronic atrial fibrillation: Secondary | ICD-10-CM | POA: Diagnosis not present

## 2016-11-22 DIAGNOSIS — E1122 Type 2 diabetes mellitus with diabetic chronic kidney disease: Secondary | ICD-10-CM | POA: Diagnosis not present

## 2016-11-22 DIAGNOSIS — R2689 Other abnormalities of gait and mobility: Secondary | ICD-10-CM | POA: Diagnosis not present

## 2016-11-22 DIAGNOSIS — I2510 Atherosclerotic heart disease of native coronary artery without angina pectoris: Secondary | ICD-10-CM | POA: Diagnosis not present

## 2016-11-22 DIAGNOSIS — J449 Chronic obstructive pulmonary disease, unspecified: Secondary | ICD-10-CM | POA: Diagnosis not present

## 2016-11-22 DIAGNOSIS — F0391 Unspecified dementia with behavioral disturbance: Secondary | ICD-10-CM | POA: Diagnosis not present

## 2016-11-27 DIAGNOSIS — N2889 Other specified disorders of kidney and ureter: Secondary | ICD-10-CM | POA: Diagnosis not present

## 2016-11-27 DIAGNOSIS — E119 Type 2 diabetes mellitus without complications: Secondary | ICD-10-CM | POA: Diagnosis not present

## 2016-11-27 DIAGNOSIS — N183 Chronic kidney disease, stage 3 (moderate): Secondary | ICD-10-CM | POA: Diagnosis not present

## 2016-11-30 DIAGNOSIS — I951 Orthostatic hypotension: Secondary | ICD-10-CM | POA: Diagnosis not present

## 2016-11-30 DIAGNOSIS — R0602 Shortness of breath: Secondary | ICD-10-CM | POA: Diagnosis not present

## 2017-03-06 DIAGNOSIS — Z23 Encounter for immunization: Secondary | ICD-10-CM | POA: Diagnosis not present

## 2017-03-06 DIAGNOSIS — I10 Essential (primary) hypertension: Secondary | ICD-10-CM | POA: Diagnosis not present

## 2017-05-08 DIAGNOSIS — Z1231 Encounter for screening mammogram for malignant neoplasm of breast: Secondary | ICD-10-CM | POA: Diagnosis not present

## 2017-06-07 DIAGNOSIS — J449 Chronic obstructive pulmonary disease, unspecified: Secondary | ICD-10-CM | POA: Diagnosis not present

## 2017-06-07 DIAGNOSIS — I10 Essential (primary) hypertension: Secondary | ICD-10-CM | POA: Diagnosis not present

## 2017-06-07 DIAGNOSIS — E119 Type 2 diabetes mellitus without complications: Secondary | ICD-10-CM | POA: Diagnosis not present

## 2017-06-07 DIAGNOSIS — Z86711 Personal history of pulmonary embolism: Secondary | ICD-10-CM | POA: Diagnosis not present

## 2017-06-07 DIAGNOSIS — E7800 Pure hypercholesterolemia, unspecified: Secondary | ICD-10-CM | POA: Diagnosis not present

## 2017-06-07 DIAGNOSIS — N390 Urinary tract infection, site not specified: Secondary | ICD-10-CM | POA: Diagnosis not present

## 2017-06-07 DIAGNOSIS — I4891 Unspecified atrial fibrillation: Secondary | ICD-10-CM | POA: Diagnosis not present

## 2017-06-07 DIAGNOSIS — E538 Deficiency of other specified B group vitamins: Secondary | ICD-10-CM | POA: Diagnosis not present

## 2017-06-07 DIAGNOSIS — I679 Cerebrovascular disease, unspecified: Secondary | ICD-10-CM | POA: Diagnosis not present

## 2017-06-07 DIAGNOSIS — Z0000 Encounter for general adult medical examination without abnormal findings: Secondary | ICD-10-CM | POA: Diagnosis not present

## 2017-06-07 DIAGNOSIS — I2510 Atherosclerotic heart disease of native coronary artery without angina pectoris: Secondary | ICD-10-CM | POA: Diagnosis not present

## 2017-09-06 DIAGNOSIS — E119 Type 2 diabetes mellitus without complications: Secondary | ICD-10-CM | POA: Diagnosis not present

## 2017-09-06 DIAGNOSIS — E039 Hypothyroidism, unspecified: Secondary | ICD-10-CM | POA: Diagnosis not present

## 2017-09-06 DIAGNOSIS — I10 Essential (primary) hypertension: Secondary | ICD-10-CM | POA: Diagnosis not present

## 2017-11-25 DIAGNOSIS — R7989 Other specified abnormal findings of blood chemistry: Secondary | ICD-10-CM | POA: Diagnosis not present

## 2017-11-25 DIAGNOSIS — R072 Precordial pain: Secondary | ICD-10-CM | POA: Diagnosis not present

## 2017-11-25 DIAGNOSIS — I2510 Atherosclerotic heart disease of native coronary artery without angina pectoris: Secondary | ICD-10-CM | POA: Diagnosis not present

## 2017-11-25 DIAGNOSIS — I9589 Other hypotension: Secondary | ICD-10-CM | POA: Diagnosis not present

## 2017-11-25 DIAGNOSIS — R0789 Other chest pain: Secondary | ICD-10-CM | POA: Diagnosis not present

## 2017-11-25 DIAGNOSIS — Z951 Presence of aortocoronary bypass graft: Secondary | ICD-10-CM | POA: Diagnosis not present

## 2017-11-25 DIAGNOSIS — I959 Hypotension, unspecified: Secondary | ICD-10-CM | POA: Diagnosis not present

## 2017-11-25 DIAGNOSIS — R079 Chest pain, unspecified: Secondary | ICD-10-CM | POA: Diagnosis not present

## 2017-11-25 DIAGNOSIS — J449 Chronic obstructive pulmonary disease, unspecified: Secondary | ICD-10-CM | POA: Diagnosis not present

## 2017-11-25 DIAGNOSIS — E039 Hypothyroidism, unspecified: Secondary | ICD-10-CM | POA: Diagnosis not present

## 2017-11-25 DIAGNOSIS — R0902 Hypoxemia: Secondary | ICD-10-CM | POA: Diagnosis not present

## 2017-11-25 DIAGNOSIS — I25700 Atherosclerosis of coronary artery bypass graft(s), unspecified, with unstable angina pectoris: Secondary | ICD-10-CM | POA: Diagnosis not present

## 2017-11-25 DIAGNOSIS — I482 Chronic atrial fibrillation: Secondary | ICD-10-CM | POA: Diagnosis not present

## 2017-11-25 DIAGNOSIS — Z86711 Personal history of pulmonary embolism: Secondary | ICD-10-CM

## 2017-11-25 DIAGNOSIS — R001 Bradycardia, unspecified: Secondary | ICD-10-CM | POA: Diagnosis not present

## 2017-11-25 DIAGNOSIS — N289 Disorder of kidney and ureter, unspecified: Secondary | ICD-10-CM | POA: Diagnosis not present

## 2017-11-25 DIAGNOSIS — G43019 Migraine without aura, intractable, without status migrainosus: Secondary | ICD-10-CM | POA: Diagnosis not present

## 2017-11-25 DIAGNOSIS — R51 Headache: Secondary | ICD-10-CM | POA: Diagnosis not present

## 2017-11-25 DIAGNOSIS — Z8673 Personal history of transient ischemic attack (TIA), and cerebral infarction without residual deficits: Secondary | ICD-10-CM

## 2017-11-25 DIAGNOSIS — Z7901 Long term (current) use of anticoagulants: Secondary | ICD-10-CM

## 2017-11-25 DIAGNOSIS — N179 Acute kidney failure, unspecified: Secondary | ICD-10-CM | POA: Diagnosis not present

## 2017-11-25 DIAGNOSIS — E119 Type 2 diabetes mellitus without complications: Secondary | ICD-10-CM | POA: Diagnosis not present

## 2017-11-26 DIAGNOSIS — R079 Chest pain, unspecified: Secondary | ICD-10-CM | POA: Diagnosis not present

## 2017-11-26 DIAGNOSIS — G43019 Migraine without aura, intractable, without status migrainosus: Secondary | ICD-10-CM | POA: Diagnosis not present

## 2017-12-09 DIAGNOSIS — E119 Type 2 diabetes mellitus without complications: Secondary | ICD-10-CM | POA: Diagnosis not present

## 2017-12-09 DIAGNOSIS — G43009 Migraine without aura, not intractable, without status migrainosus: Secondary | ICD-10-CM | POA: Diagnosis not present

## 2017-12-09 DIAGNOSIS — E039 Hypothyroidism, unspecified: Secondary | ICD-10-CM | POA: Diagnosis not present

## 2017-12-26 DIAGNOSIS — Z79899 Other long term (current) drug therapy: Secondary | ICD-10-CM | POA: Insufficient documentation

## 2017-12-26 DIAGNOSIS — G43901 Migraine, unspecified, not intractable, with status migrainosus: Secondary | ICD-10-CM | POA: Diagnosis not present

## 2017-12-26 DIAGNOSIS — G44309 Post-traumatic headache, unspecified, not intractable: Secondary | ICD-10-CM | POA: Diagnosis not present

## 2018-02-26 DIAGNOSIS — Z9181 History of falling: Secondary | ICD-10-CM | POA: Diagnosis not present

## 2018-02-26 DIAGNOSIS — Z8673 Personal history of transient ischemic attack (TIA), and cerebral infarction without residual deficits: Secondary | ICD-10-CM | POA: Diagnosis not present

## 2018-02-26 DIAGNOSIS — Z7901 Long term (current) use of anticoagulants: Secondary | ICD-10-CM | POA: Diagnosis not present

## 2018-02-26 DIAGNOSIS — Z87891 Personal history of nicotine dependence: Secondary | ICD-10-CM | POA: Diagnosis not present

## 2018-02-26 DIAGNOSIS — R42 Dizziness and giddiness: Secondary | ICD-10-CM | POA: Diagnosis not present

## 2018-02-26 DIAGNOSIS — Z79899 Other long term (current) drug therapy: Secondary | ICD-10-CM | POA: Diagnosis not present

## 2018-02-26 DIAGNOSIS — R51 Headache: Secondary | ICD-10-CM | POA: Diagnosis not present

## 2018-03-07 DIAGNOSIS — I25700 Atherosclerosis of coronary artery bypass graft(s), unspecified, with unstable angina pectoris: Secondary | ICD-10-CM | POA: Diagnosis not present

## 2018-03-07 DIAGNOSIS — J181 Lobar pneumonia, unspecified organism: Secondary | ICD-10-CM | POA: Diagnosis not present

## 2018-03-07 DIAGNOSIS — R918 Other nonspecific abnormal finding of lung field: Secondary | ICD-10-CM | POA: Diagnosis not present

## 2018-03-07 DIAGNOSIS — I959 Hypotension, unspecified: Secondary | ICD-10-CM | POA: Diagnosis not present

## 2018-03-07 DIAGNOSIS — Z7901 Long term (current) use of anticoagulants: Secondary | ICD-10-CM | POA: Diagnosis not present

## 2018-03-07 DIAGNOSIS — R531 Weakness: Secondary | ICD-10-CM | POA: Diagnosis not present

## 2018-03-07 DIAGNOSIS — I4891 Unspecified atrial fibrillation: Secondary | ICD-10-CM | POA: Diagnosis not present

## 2018-03-07 DIAGNOSIS — N183 Chronic kidney disease, stage 3 (moderate): Secondary | ICD-10-CM | POA: Diagnosis not present

## 2018-03-07 DIAGNOSIS — I9589 Other hypotension: Secondary | ICD-10-CM | POA: Diagnosis not present

## 2018-03-07 DIAGNOSIS — R51 Headache: Secondary | ICD-10-CM | POA: Diagnosis not present

## 2018-03-07 DIAGNOSIS — Z993 Dependence on wheelchair: Secondary | ICD-10-CM | POA: Diagnosis not present

## 2018-03-07 DIAGNOSIS — E1122 Type 2 diabetes mellitus with diabetic chronic kidney disease: Secondary | ICD-10-CM | POA: Diagnosis not present

## 2018-03-07 DIAGNOSIS — Z789 Other specified health status: Secondary | ICD-10-CM | POA: Diagnosis not present

## 2018-03-07 DIAGNOSIS — J440 Chronic obstructive pulmonary disease with acute lower respiratory infection: Secondary | ICD-10-CM | POA: Diagnosis not present

## 2018-03-07 DIAGNOSIS — E876 Hypokalemia: Secondary | ICD-10-CM | POA: Diagnosis not present

## 2018-03-07 DIAGNOSIS — J189 Pneumonia, unspecified organism: Secondary | ICD-10-CM | POA: Diagnosis not present

## 2018-03-07 DIAGNOSIS — N179 Acute kidney failure, unspecified: Secondary | ICD-10-CM | POA: Diagnosis not present

## 2018-03-07 DIAGNOSIS — E039 Hypothyroidism, unspecified: Secondary | ICD-10-CM | POA: Diagnosis not present

## 2018-03-07 DIAGNOSIS — R0609 Other forms of dyspnea: Secondary | ICD-10-CM | POA: Diagnosis not present

## 2018-03-07 DIAGNOSIS — Z23 Encounter for immunization: Secondary | ICD-10-CM | POA: Diagnosis not present

## 2018-03-08 DIAGNOSIS — R531 Weakness: Secondary | ICD-10-CM

## 2018-03-12 DIAGNOSIS — E876 Hypokalemia: Secondary | ICD-10-CM | POA: Diagnosis not present

## 2018-03-12 DIAGNOSIS — E1122 Type 2 diabetes mellitus with diabetic chronic kidney disease: Secondary | ICD-10-CM | POA: Diagnosis not present

## 2018-03-12 DIAGNOSIS — J181 Lobar pneumonia, unspecified organism: Secondary | ICD-10-CM | POA: Diagnosis not present

## 2018-03-12 DIAGNOSIS — Z993 Dependence on wheelchair: Secondary | ICD-10-CM | POA: Diagnosis not present

## 2018-03-12 DIAGNOSIS — I25700 Atherosclerosis of coronary artery bypass graft(s), unspecified, with unstable angina pectoris: Secondary | ICD-10-CM | POA: Diagnosis not present

## 2018-03-12 DIAGNOSIS — N179 Acute kidney failure, unspecified: Secondary | ICD-10-CM | POA: Diagnosis not present

## 2018-03-12 DIAGNOSIS — J440 Chronic obstructive pulmonary disease with acute lower respiratory infection: Secondary | ICD-10-CM | POA: Diagnosis not present

## 2018-03-12 DIAGNOSIS — I4891 Unspecified atrial fibrillation: Secondary | ICD-10-CM | POA: Diagnosis not present

## 2018-03-12 DIAGNOSIS — Z789 Other specified health status: Secondary | ICD-10-CM | POA: Diagnosis not present

## 2018-03-14 ENCOUNTER — Other Ambulatory Visit: Payer: Self-pay | Admitting: *Deleted

## 2018-03-14 DIAGNOSIS — I482 Chronic atrial fibrillation, unspecified: Secondary | ICD-10-CM | POA: Diagnosis not present

## 2018-03-14 DIAGNOSIS — J449 Chronic obstructive pulmonary disease, unspecified: Secondary | ICD-10-CM | POA: Diagnosis not present

## 2018-03-14 DIAGNOSIS — D631 Anemia in chronic kidney disease: Secondary | ICD-10-CM | POA: Diagnosis not present

## 2018-03-14 DIAGNOSIS — E1122 Type 2 diabetes mellitus with diabetic chronic kidney disease: Secondary | ICD-10-CM | POA: Diagnosis not present

## 2018-03-14 DIAGNOSIS — J189 Pneumonia, unspecified organism: Secondary | ICD-10-CM | POA: Diagnosis not present

## 2018-03-14 DIAGNOSIS — F0391 Unspecified dementia with behavioral disturbance: Secondary | ICD-10-CM | POA: Diagnosis not present

## 2018-03-14 DIAGNOSIS — N183 Chronic kidney disease, stage 3 (moderate): Secondary | ICD-10-CM | POA: Diagnosis not present

## 2018-03-14 DIAGNOSIS — I2510 Atherosclerotic heart disease of native coronary artery without angina pectoris: Secondary | ICD-10-CM | POA: Diagnosis not present

## 2018-03-14 DIAGNOSIS — I129 Hypertensive chronic kidney disease with stage 1 through stage 4 chronic kidney disease, or unspecified chronic kidney disease: Secondary | ICD-10-CM | POA: Diagnosis not present

## 2018-03-14 NOTE — Patient Outreach (Signed)
[facility]) Care Management  03/14/2018  Jenny Kelly 1945/09/08 [id number]   Transition of Care Referral   Referral Date: 03/14/18 Referral Source:  Date of Admission:  Diagnosis:  Date of Discharge: on 03/12/18. Facility: [facility]   Insurance: [employer] medicare    Conditions: MI with stents March 2002, stroke in July 2009 (patient states stroke causes R sided symptoms), headaches since her stroke, seizures, pulmonary embolism (on [name]), atrial fibrillation, bilateral knee replacement, gall baldder removal, CKD (not on dialysis), COPD, vitamin B 12 deficiency, HLD, and depression    Outreach attempt # 1 No answer. [facility] RN CM left HIPAA compliant voicemail message along with CM's contact info.   Plan: [facility] RN CM sent an unsuccessful outreach letter and scheduled this patient for another call attempt within 4 business days    L. Noelle Penner, RN, BSN, CCM [facility] Telephonic Care Management Care Coordinator Direct Number [phone] Mobile number [phone]  Main [facility] number [phone] Fax number [phone]

## 2018-03-17 ENCOUNTER — Ambulatory Visit: Payer: Self-pay | Admitting: *Deleted

## 2018-03-18 ENCOUNTER — Ambulatory Visit: Payer: Self-pay | Admitting: *Deleted

## 2018-03-19 ENCOUNTER — Other Ambulatory Visit: Payer: Self-pay | Admitting: *Deleted

## 2018-03-19 DIAGNOSIS — B372 Candidiasis of skin and nail: Secondary | ICD-10-CM | POA: Diagnosis not present

## 2018-03-19 DIAGNOSIS — J4520 Mild intermittent asthma, uncomplicated: Secondary | ICD-10-CM | POA: Diagnosis not present

## 2018-03-19 DIAGNOSIS — J441 Chronic obstructive pulmonary disease with (acute) exacerbation: Secondary | ICD-10-CM | POA: Diagnosis not present

## 2018-03-19 NOTE — Patient Outreach (Signed)
[facility]) Care Management  03/19/2018  Jenny Kelly [birth year] [id number]   Transition of Care Referral  Referral Date: 03/14/18 Referral Source:  Date of Admission:  Diagnosis:  Date of Discharge: on 03/12/18. Facility: [facility]   Insurance: [employer] medicare    Conditions: MI with stents March 2002, stroke in July 2009 (patient states stroke causes R sided symptoms), headaches since her stroke, seizures, pulmonary embolism (on [name]), atrial fibrillation, bilateral knee replacement, gall baldder removal, CKD (not on dialysis), COPD, vitamin B 12 deficiency, HLD, and depression   Outreach attempt # 2 No answer. [facility] RN CM left HIPAA compliant voicemail message along with CM's contact info.   Plan: [facility] RN CMscheduled this patient for last call  attempt within 4 business days    [name] L. Noelle Penner, RN, BSN, CCM [facility] Telephonic Care Management Care Coordinator Office number [phone] Mobile number [phone]  Main [facility] number [phone] Fax number [phone]

## 2018-03-25 ENCOUNTER — Other Ambulatory Visit: Payer: Self-pay | Admitting: *Deleted

## 2018-03-25 DIAGNOSIS — I482 Chronic atrial fibrillation, unspecified: Secondary | ICD-10-CM | POA: Diagnosis not present

## 2018-03-25 DIAGNOSIS — D631 Anemia in chronic kidney disease: Secondary | ICD-10-CM | POA: Diagnosis not present

## 2018-03-25 DIAGNOSIS — I2510 Atherosclerotic heart disease of native coronary artery without angina pectoris: Secondary | ICD-10-CM | POA: Diagnosis not present

## 2018-03-25 DIAGNOSIS — J189 Pneumonia, unspecified organism: Secondary | ICD-10-CM | POA: Diagnosis not present

## 2018-03-25 DIAGNOSIS — N183 Chronic kidney disease, stage 3 (moderate): Secondary | ICD-10-CM | POA: Diagnosis not present

## 2018-03-25 DIAGNOSIS — I129 Hypertensive chronic kidney disease with stage 1 through stage 4 chronic kidney disease, or unspecified chronic kidney disease: Secondary | ICD-10-CM | POA: Diagnosis not present

## 2018-03-25 DIAGNOSIS — J449 Chronic obstructive pulmonary disease, unspecified: Secondary | ICD-10-CM | POA: Diagnosis not present

## 2018-03-25 DIAGNOSIS — E1122 Type 2 diabetes mellitus with diabetic chronic kidney disease: Secondary | ICD-10-CM | POA: Diagnosis not present

## 2018-03-25 DIAGNOSIS — F0391 Unspecified dementia with behavioral disturbance: Secondary | ICD-10-CM | POA: Diagnosis not present

## 2018-03-25 NOTE — Patient Outreach (Signed)
[facility]) Care Management  03/25/2018  Jenny Kelly Mar 30, 1946 [id number]   Transition of Care Referral  Referral Date:03/14/18 Referral Source:  [facility] inpatient referral  Date of Admission: unknown  Diagnosis: unknown Date of Discharge:on 03/12/18. Facility:[facility] Insurance:[employer] medicare    Outreach attempt # 3 successful at the home number   Patient's husband, Fayrene Fearing is able to verify HIPAA Reviewed and addressed EMMI red alert/referral to [facility] with Fayrene Fearing The patient has dementia  He confirms Mrs Flagler is doing well since d/c home and denies any needs for medical, medications, DME or appointments   Home health RN from [facility] came Friday 03/21/18 and returns on today  03/25/18 Followed with pcp on 03/19/18 He did receive and reviewed the [facility] outreach letter and will call if needs arise   Social: The patient has dementia Her husband Fayrene Fearing is the primary care giver and assist with her ADLs, iADLs and transportation to medical appointments  Conditions:MI with stents March 2002, stroke in July 2009 (patient states stroke causes R sided symptoms), headaches since her stroke, seizures, pulmonary embolism (on [place]), atrial fibrillation, bilateral knee replacement, gall baldder removal, CKD (not on dialysis), COPD, vitamin B 12 deficiency, HLD, and depression DME walker Medications: denies concerns with taking medications as prescribed, affording medications, side effects of medications and questions about medications   Advance Directives: Denies need for assist with advance directives     Consent: [facility] RN CM reviewed [facility] services with patient. Patient gave verbal consent for services.   Plan [facility] RN CM will close case at this time as patient has been assessed and no needs identified.   [facility] RN CM sent a successful outreach letter as discussed with [facility] brochure enclosed for review  Pt encouraged to return a call to  [facility] RN CM prn  [facility] RN CM will close case at this time as patient has been assessed and needs resolved.   [facility] RN CM will prepare for case closure per call attempts workflow if no return call from this patient.  An unsuccessful outreach letter was sent on ______.  Kimberly L. Noelle Penner, RN, BSN, CCM [facility] Telephonic Care Management Care Coordinator Office number [phone] Mobile number [phone]  Main [facility] number [phone] Fax number [phone]

## 2018-04-01 DIAGNOSIS — J209 Acute bronchitis, unspecified: Secondary | ICD-10-CM | POA: Diagnosis not present

## 2018-04-02 DIAGNOSIS — E1122 Type 2 diabetes mellitus with diabetic chronic kidney disease: Secondary | ICD-10-CM | POA: Diagnosis not present

## 2018-04-02 DIAGNOSIS — N183 Chronic kidney disease, stage 3 (moderate): Secondary | ICD-10-CM | POA: Diagnosis not present

## 2018-04-02 DIAGNOSIS — F0391 Unspecified dementia with behavioral disturbance: Secondary | ICD-10-CM | POA: Diagnosis not present

## 2018-04-02 DIAGNOSIS — I129 Hypertensive chronic kidney disease with stage 1 through stage 4 chronic kidney disease, or unspecified chronic kidney disease: Secondary | ICD-10-CM | POA: Diagnosis not present

## 2018-04-02 DIAGNOSIS — I2510 Atherosclerotic heart disease of native coronary artery without angina pectoris: Secondary | ICD-10-CM | POA: Diagnosis not present

## 2018-04-02 DIAGNOSIS — I482 Chronic atrial fibrillation, unspecified: Secondary | ICD-10-CM | POA: Diagnosis not present

## 2018-04-02 DIAGNOSIS — J449 Chronic obstructive pulmonary disease, unspecified: Secondary | ICD-10-CM | POA: Diagnosis not present

## 2018-04-02 DIAGNOSIS — D631 Anemia in chronic kidney disease: Secondary | ICD-10-CM | POA: Diagnosis not present

## 2018-04-02 DIAGNOSIS — J189 Pneumonia, unspecified organism: Secondary | ICD-10-CM | POA: Diagnosis not present

## 2018-04-11 DIAGNOSIS — I482 Chronic atrial fibrillation, unspecified: Secondary | ICD-10-CM | POA: Diagnosis not present

## 2018-04-11 DIAGNOSIS — I2510 Atherosclerotic heart disease of native coronary artery without angina pectoris: Secondary | ICD-10-CM | POA: Diagnosis not present

## 2018-04-11 DIAGNOSIS — J449 Chronic obstructive pulmonary disease, unspecified: Secondary | ICD-10-CM | POA: Diagnosis not present

## 2018-04-11 DIAGNOSIS — E1122 Type 2 diabetes mellitus with diabetic chronic kidney disease: Secondary | ICD-10-CM | POA: Diagnosis not present

## 2018-04-11 DIAGNOSIS — I129 Hypertensive chronic kidney disease with stage 1 through stage 4 chronic kidney disease, or unspecified chronic kidney disease: Secondary | ICD-10-CM | POA: Diagnosis not present

## 2018-04-11 DIAGNOSIS — J189 Pneumonia, unspecified organism: Secondary | ICD-10-CM | POA: Diagnosis not present

## 2018-04-11 DIAGNOSIS — D631 Anemia in chronic kidney disease: Secondary | ICD-10-CM | POA: Diagnosis not present

## 2018-04-11 DIAGNOSIS — F0391 Unspecified dementia with behavioral disturbance: Secondary | ICD-10-CM | POA: Diagnosis not present

## 2018-04-11 DIAGNOSIS — N183 Chronic kidney disease, stage 3 (moderate): Secondary | ICD-10-CM | POA: Diagnosis not present

## 2018-04-15 DIAGNOSIS — R1084 Generalized abdominal pain: Secondary | ICD-10-CM | POA: Diagnosis not present

## 2018-04-15 DIAGNOSIS — N390 Urinary tract infection, site not specified: Secondary | ICD-10-CM | POA: Diagnosis not present

## 2018-04-15 DIAGNOSIS — B9689 Other specified bacterial agents as the cause of diseases classified elsewhere: Secondary | ICD-10-CM | POA: Diagnosis not present

## 2018-04-15 DIAGNOSIS — K859 Acute pancreatitis without necrosis or infection, unspecified: Secondary | ICD-10-CM | POA: Diagnosis not present

## 2018-04-16 DIAGNOSIS — I2699 Other pulmonary embolism without acute cor pulmonale: Secondary | ICD-10-CM | POA: Diagnosis not present

## 2018-04-16 DIAGNOSIS — I5033 Acute on chronic diastolic (congestive) heart failure: Secondary | ICD-10-CM | POA: Diagnosis not present

## 2018-04-16 DIAGNOSIS — N390 Urinary tract infection, site not specified: Secondary | ICD-10-CM | POA: Diagnosis not present

## 2018-04-16 DIAGNOSIS — N183 Chronic kidney disease, stage 3 (moderate): Secondary | ICD-10-CM | POA: Diagnosis not present

## 2018-04-16 DIAGNOSIS — A419 Sepsis, unspecified organism: Secondary | ICD-10-CM | POA: Diagnosis not present

## 2018-04-16 DIAGNOSIS — K2980 Duodenitis without bleeding: Secondary | ICD-10-CM | POA: Diagnosis not present

## 2018-04-16 DIAGNOSIS — E8809 Other disorders of plasma-protein metabolism, not elsewhere classified: Secondary | ICD-10-CM | POA: Diagnosis not present

## 2018-04-16 DIAGNOSIS — R1310 Dysphagia, unspecified: Secondary | ICD-10-CM | POA: Diagnosis not present

## 2018-04-16 DIAGNOSIS — N200 Calculus of kidney: Secondary | ICD-10-CM | POA: Diagnosis not present

## 2018-04-16 DIAGNOSIS — I25700 Atherosclerosis of coronary artery bypass graft(s), unspecified, with unstable angina pectoris: Secondary | ICD-10-CM | POA: Diagnosis not present

## 2018-04-16 DIAGNOSIS — R1013 Epigastric pain: Secondary | ICD-10-CM | POA: Diagnosis not present

## 2018-04-16 DIAGNOSIS — I482 Chronic atrial fibrillation, unspecified: Secondary | ICD-10-CM | POA: Diagnosis not present

## 2018-04-16 DIAGNOSIS — E039 Hypothyroidism, unspecified: Secondary | ICD-10-CM | POA: Diagnosis not present

## 2018-04-16 DIAGNOSIS — K222 Esophageal obstruction: Secondary | ICD-10-CM | POA: Diagnosis not present

## 2018-04-16 DIAGNOSIS — I9589 Other hypotension: Secondary | ICD-10-CM | POA: Diagnosis not present

## 2018-04-16 DIAGNOSIS — R109 Unspecified abdominal pain: Secondary | ICD-10-CM | POA: Diagnosis not present

## 2018-04-16 DIAGNOSIS — E119 Type 2 diabetes mellitus without complications: Secondary | ICD-10-CM | POA: Diagnosis not present

## 2018-04-16 DIAGNOSIS — E440 Moderate protein-calorie malnutrition: Secondary | ICD-10-CM | POA: Diagnosis not present

## 2018-04-16 DIAGNOSIS — R1012 Left upper quadrant pain: Secondary | ICD-10-CM | POA: Diagnosis not present

## 2018-04-16 DIAGNOSIS — R319 Hematuria, unspecified: Secondary | ICD-10-CM | POA: Diagnosis not present

## 2018-04-16 DIAGNOSIS — Z8673 Personal history of transient ischemic attack (TIA), and cerebral infarction without residual deficits: Secondary | ICD-10-CM | POA: Diagnosis not present

## 2018-04-16 DIAGNOSIS — D72829 Elevated white blood cell count, unspecified: Secondary | ICD-10-CM | POA: Diagnosis not present

## 2018-04-16 DIAGNOSIS — K2970 Gastritis, unspecified, without bleeding: Secondary | ICD-10-CM | POA: Diagnosis not present

## 2018-04-16 DIAGNOSIS — E46 Unspecified protein-calorie malnutrition: Secondary | ICD-10-CM | POA: Diagnosis not present

## 2018-04-16 DIAGNOSIS — E876 Hypokalemia: Secondary | ICD-10-CM | POA: Diagnosis not present

## 2018-04-16 DIAGNOSIS — K8591 Acute pancreatitis with uninfected necrosis, unspecified: Secondary | ICD-10-CM | POA: Diagnosis not present

## 2018-04-16 DIAGNOSIS — K859 Acute pancreatitis without necrosis or infection, unspecified: Secondary | ICD-10-CM | POA: Diagnosis not present

## 2018-04-18 DIAGNOSIS — I5033 Acute on chronic diastolic (congestive) heart failure: Secondary | ICD-10-CM

## 2018-04-23 DIAGNOSIS — E119 Type 2 diabetes mellitus without complications: Secondary | ICD-10-CM

## 2018-04-23 DIAGNOSIS — E8809 Other disorders of plasma-protein metabolism, not elsewhere classified: Secondary | ICD-10-CM

## 2018-04-23 DIAGNOSIS — I5033 Acute on chronic diastolic (congestive) heart failure: Secondary | ICD-10-CM

## 2018-04-23 DIAGNOSIS — I2699 Other pulmonary embolism without acute cor pulmonale: Secondary | ICD-10-CM

## 2018-04-23 DIAGNOSIS — D72829 Elevated white blood cell count, unspecified: Secondary | ICD-10-CM

## 2018-04-23 DIAGNOSIS — R319 Hematuria, unspecified: Secondary | ICD-10-CM

## 2018-04-30 ENCOUNTER — Other Ambulatory Visit: Payer: Self-pay

## 2018-04-30 DIAGNOSIS — K2950 Unspecified chronic gastritis without bleeding: Secondary | ICD-10-CM | POA: Diagnosis not present

## 2018-04-30 DIAGNOSIS — I482 Chronic atrial fibrillation, unspecified: Secondary | ICD-10-CM | POA: Diagnosis not present

## 2018-04-30 DIAGNOSIS — D631 Anemia in chronic kidney disease: Secondary | ICD-10-CM | POA: Diagnosis not present

## 2018-04-30 DIAGNOSIS — E1122 Type 2 diabetes mellitus with diabetic chronic kidney disease: Secondary | ICD-10-CM | POA: Diagnosis not present

## 2018-04-30 DIAGNOSIS — J449 Chronic obstructive pulmonary disease, unspecified: Secondary | ICD-10-CM | POA: Diagnosis not present

## 2018-04-30 DIAGNOSIS — I25700 Atherosclerosis of coronary artery bypass graft(s), unspecified, with unstable angina pectoris: Secondary | ICD-10-CM | POA: Diagnosis not present

## 2018-04-30 DIAGNOSIS — N183 Chronic kidney disease, stage 3 (moderate): Secondary | ICD-10-CM | POA: Diagnosis not present

## 2018-04-30 DIAGNOSIS — I129 Hypertensive chronic kidney disease with stage 1 through stage 4 chronic kidney disease, or unspecified chronic kidney disease: Secondary | ICD-10-CM | POA: Diagnosis not present

## 2018-04-30 DIAGNOSIS — F0391 Unspecified dementia with behavioral disturbance: Secondary | ICD-10-CM | POA: Diagnosis not present

## 2018-04-30 NOTE — Patient Outreach (Signed)
[facility]) Care Management  04/30/2018  Jenny Kelly 07-10-1945 [id number]     Transition of Care Referral  Referral Date: 04/30/2018  Referral Source: [facility] Discharge Report Date of Admission: unknown Diagnosis: unknown Date of Discharge: 04/28/2018 Facility: [facility] Insurance: [facility]    Outreach attempt # 1 to patient. No answer at present. RN CM left HIPAA compliant voicemail message along with contact info.    Plan: RN CM will make outreach attempt to patient within 3-4 business days. RN CM will send unsuccessful outreach letter to patient.   Antionette Fairy, RN,BSN,CCM [facility] Care Management Telephonic Care Management Coordinator Direct Phone: [phone] Toll Free: [phone] Fax: [phone]

## 2018-05-01 DIAGNOSIS — R0902 Hypoxemia: Secondary | ICD-10-CM | POA: Diagnosis not present

## 2018-05-01 DIAGNOSIS — I9589 Other hypotension: Secondary | ICD-10-CM | POA: Diagnosis not present

## 2018-05-01 DIAGNOSIS — A419 Sepsis, unspecified organism: Secondary | ICD-10-CM | POA: Diagnosis not present

## 2018-05-01 DIAGNOSIS — I959 Hypotension, unspecified: Secondary | ICD-10-CM | POA: Diagnosis not present

## 2018-05-01 DIAGNOSIS — T40605A Adverse effect of unspecified narcotics, initial encounter: Secondary | ICD-10-CM | POA: Diagnosis not present

## 2018-05-01 DIAGNOSIS — R069 Unspecified abnormalities of breathing: Secondary | ICD-10-CM | POA: Diagnosis not present

## 2018-05-01 DIAGNOSIS — J9601 Acute respiratory failure with hypoxia: Secondary | ICD-10-CM | POA: Diagnosis not present

## 2018-05-01 DIAGNOSIS — R51 Headache: Secondary | ICD-10-CM | POA: Diagnosis not present

## 2018-05-01 DIAGNOSIS — R05 Cough: Secondary | ICD-10-CM | POA: Diagnosis not present

## 2018-05-01 DIAGNOSIS — J441 Chronic obstructive pulmonary disease with (acute) exacerbation: Secondary | ICD-10-CM | POA: Diagnosis not present

## 2018-05-01 DIAGNOSIS — I25700 Atherosclerosis of coronary artery bypass graft(s), unspecified, with unstable angina pectoris: Secondary | ICD-10-CM | POA: Diagnosis not present

## 2018-05-01 DIAGNOSIS — J449 Chronic obstructive pulmonary disease, unspecified: Secondary | ICD-10-CM | POA: Diagnosis not present

## 2018-05-01 DIAGNOSIS — E860 Dehydration: Secondary | ICD-10-CM | POA: Diagnosis not present

## 2018-05-01 DIAGNOSIS — E119 Type 2 diabetes mellitus without complications: Secondary | ICD-10-CM | POA: Diagnosis not present

## 2018-05-01 DIAGNOSIS — T68XXXA Hypothermia, initial encounter: Secondary | ICD-10-CM | POA: Diagnosis not present

## 2018-05-01 DIAGNOSIS — N179 Acute kidney failure, unspecified: Secondary | ICD-10-CM | POA: Diagnosis not present

## 2018-05-01 DIAGNOSIS — Z7901 Long term (current) use of anticoagulants: Secondary | ICD-10-CM

## 2018-05-01 DIAGNOSIS — J189 Pneumonia, unspecified organism: Secondary | ICD-10-CM | POA: Diagnosis not present

## 2018-05-01 DIAGNOSIS — I2510 Atherosclerotic heart disease of native coronary artery without angina pectoris: Secondary | ICD-10-CM | POA: Diagnosis not present

## 2018-05-01 DIAGNOSIS — N200 Calculus of kidney: Secondary | ICD-10-CM | POA: Diagnosis not present

## 2018-05-01 DIAGNOSIS — E875 Hyperkalemia: Secondary | ICD-10-CM | POA: Diagnosis not present

## 2018-05-01 DIAGNOSIS — K859 Acute pancreatitis without necrosis or infection, unspecified: Secondary | ICD-10-CM | POA: Diagnosis not present

## 2018-05-01 DIAGNOSIS — K2950 Unspecified chronic gastritis without bleeding: Secondary | ICD-10-CM | POA: Diagnosis not present

## 2018-05-01 DIAGNOSIS — I639 Cerebral infarction, unspecified: Secondary | ICD-10-CM | POA: Diagnosis not present

## 2018-05-01 DIAGNOSIS — K2970 Gastritis, unspecified, without bleeding: Secondary | ICD-10-CM

## 2018-05-01 DIAGNOSIS — J439 Emphysema, unspecified: Secondary | ICD-10-CM | POA: Diagnosis not present

## 2018-05-02 ENCOUNTER — Ambulatory Visit: Payer: Self-pay

## 2018-05-05 ENCOUNTER — Other Ambulatory Visit: Payer: Self-pay

## 2018-05-05 DIAGNOSIS — R109 Unspecified abdominal pain: Secondary | ICD-10-CM | POA: Diagnosis not present

## 2018-05-05 DIAGNOSIS — I5032 Chronic diastolic (congestive) heart failure: Secondary | ICD-10-CM | POA: Diagnosis not present

## 2018-05-05 NOTE — Patient Outreach (Signed)
Telephone assessment:  Placed call to patient and spoke with husband who is power of attorney. He reports patient is some better since she got home from the hospital with pancreatitis. Reports that patient continues to have a cough that she has had since she had pneumonia in November. Husband reports that patient will see primary MD today for a follow up visit.   Reviewed [facility] program and its benefits. Husband has agreed to home visit on 05/08/2018. Confirmed address and provided my contact information.    PLAN: home visit in 2 days. Full assessmnent and care plan to follow after seeing patient in person.  Rowe Pavy, RN, BSN, CEN [facility] [employer] [facility] [phone]

## 2018-05-06 DIAGNOSIS — E1122 Type 2 diabetes mellitus with diabetic chronic kidney disease: Secondary | ICD-10-CM | POA: Diagnosis not present

## 2018-05-06 DIAGNOSIS — K2950 Unspecified chronic gastritis without bleeding: Secondary | ICD-10-CM | POA: Diagnosis not present

## 2018-05-06 DIAGNOSIS — J449 Chronic obstructive pulmonary disease, unspecified: Secondary | ICD-10-CM | POA: Diagnosis not present

## 2018-05-06 DIAGNOSIS — I482 Chronic atrial fibrillation, unspecified: Secondary | ICD-10-CM | POA: Diagnosis not present

## 2018-05-06 DIAGNOSIS — I129 Hypertensive chronic kidney disease with stage 1 through stage 4 chronic kidney disease, or unspecified chronic kidney disease: Secondary | ICD-10-CM | POA: Diagnosis not present

## 2018-05-06 DIAGNOSIS — I25700 Atherosclerosis of coronary artery bypass graft(s), unspecified, with unstable angina pectoris: Secondary | ICD-10-CM | POA: Diagnosis not present

## 2018-05-06 DIAGNOSIS — D631 Anemia in chronic kidney disease: Secondary | ICD-10-CM | POA: Diagnosis not present

## 2018-05-06 DIAGNOSIS — N183 Chronic kidney disease, stage 3 (moderate): Secondary | ICD-10-CM | POA: Diagnosis not present

## 2018-05-06 DIAGNOSIS — F0391 Unspecified dementia with behavioral disturbance: Secondary | ICD-10-CM | POA: Diagnosis not present

## 2018-05-08 ENCOUNTER — Other Ambulatory Visit: Payer: Self-pay

## 2018-05-08 DIAGNOSIS — E1122 Type 2 diabetes mellitus with diabetic chronic kidney disease: Secondary | ICD-10-CM | POA: Diagnosis not present

## 2018-05-08 DIAGNOSIS — F0391 Unspecified dementia with behavioral disturbance: Secondary | ICD-10-CM | POA: Diagnosis not present

## 2018-05-08 DIAGNOSIS — D631 Anemia in chronic kidney disease: Secondary | ICD-10-CM | POA: Diagnosis not present

## 2018-05-08 DIAGNOSIS — K2950 Unspecified chronic gastritis without bleeding: Secondary | ICD-10-CM | POA: Diagnosis not present

## 2018-05-08 DIAGNOSIS — I482 Chronic atrial fibrillation, unspecified: Secondary | ICD-10-CM | POA: Diagnosis not present

## 2018-05-08 DIAGNOSIS — I129 Hypertensive chronic kidney disease with stage 1 through stage 4 chronic kidney disease, or unspecified chronic kidney disease: Secondary | ICD-10-CM | POA: Diagnosis not present

## 2018-05-08 DIAGNOSIS — J449 Chronic obstructive pulmonary disease, unspecified: Secondary | ICD-10-CM | POA: Diagnosis not present

## 2018-05-08 DIAGNOSIS — I25700 Atherosclerosis of coronary artery bypass graft(s), unspecified, with unstable angina pectoris: Secondary | ICD-10-CM | POA: Diagnosis not present

## 2018-05-08 DIAGNOSIS — N183 Chronic kidney disease, stage 3 (moderate): Secondary | ICD-10-CM | POA: Diagnosis not present

## 2018-05-08 NOTE — Patient Outreach (Signed)
[facility]) Care Management   05/08/2018  Jenny Kelly [birth year] [id number]  Jenny Kelly is an 73 y.o. female Husband Vonna Kotyk is present for visit.  Subjective: Patient reports she has been in and out of the hospital since November 2019 to now with abdominal pain/ pancreatitis and pneumonia.  Patient reports she is finally feeling better and is able to walk to the bathroom unassisted.  Patient reports husband is her caregiver and he assist with all needs and medications.  Patient reports she is active with home health [facility]) and Amy is her nurse.  Husband reports that patient is the best today that she has been in many months.   Objective:  Awake and alert. Sitting in lift chair.  Ambulating well without any assistive devices.  Today's Vitals   05/08/18 1117 05/08/18 1120  BP: 120/62   Pulse: 93   Resp: 18   SpO2: 95%   Weight: 163 lb (73.9 kg)   Height: 1.575 m (5\' 2" )   PainSc:  7    Review of Systems  Constitutional: Positive for malaise/fatigue.  HENT: Positive for hearing loss.   Eyes:       Wears reading glasses  Respiratory: Positive for cough and shortness of breath.   Cardiovascular: Positive for leg swelling.  Gastrointestinal: Positive for abdominal pain and nausea.  Genitourinary: Negative.   Musculoskeletal: Positive for back pain.  Skin:       Dry skin, reports thin skin.   Neurological: Positive for headaches.       Reports headaches daily  Endo/Heme/Allergies: Bruises/bleeds easily.  Psychiatric/Behavioral: Positive for memory loss. The patient is nervous/anxious and has insomnia.     Physical Exam  Constitutional: She is oriented to person, place, and time. She appears well-developed and well-nourished.  Cardiovascular: Normal rate and intact distal pulses.  Respiratory: Effort normal and breath sounds normal.  Lungs clear  Musculoskeletal: Normal range of motion.  Neurological: She is alert and oriented to person, place,  and time.  Trump, Jan, [birth year]10.   69 years old.  Husband is 6.   Skin: Skin is warm and dry.  Psychiatric: She has a normal mood and affect. Her behavior is normal. Judgment and thought content normal.    Encounter Medications:   Outpatient Encounter Medications as of 05/08/2018  Medication Sig  . albuterol (PROVENTIL HFA;VENTOLIN HFA) 108 (90 Base) MCG/ACT inhaler Inhale 2 puffs into the lungs every 6 (six) hours as needed for wheezing or shortness of breath.  Marland Kitchen amiodarone (PACERONE) 100 MG tablet Take 50 mg by mouth daily.  . calcium carbonate (TUMS - DOSED IN MG ELEMENTAL CALCIUM) 500 MG chewable tablet Chew 1 tablet by mouth as needed for indigestion or heartburn.  . donepezil (ARICEPT) 10 MG tablet Take 10 mg by mouth at bedtime.  . ergocalciferol (VITAMIN D2) 1.25 MG (50000 UT) capsule Take 50,000 Units by mouth See admin instructions. Takes twice a week on Wednesday and Saturday  . furosemide (LASIX) 10 MG/ML solution Take 20 mg by mouth as needed.  Marland Kitchen ipratropium-albuterol (DUONEB) 0.5-2.5 (3) MG/3ML SOLN Take 3 mLs by nebulization 2 (two) times daily after a meal.  . ketorolac (TORADOL) 10 MG tablet Take 10 mg by mouth every 6 (six) hours as needed.  Marland Kitchen levothyroxine (SYNTHROID, LEVOTHROID) 100 MCG tablet Take 100 mcg by mouth daily before breakfast.  . lipase/protease/amylase (CREON) 12000 units CPEP capsule Take 24,000 Units by mouth 3 (three) times daily before meals.  . montelukast (  SINGULAIR) 10 MG tablet Take 10 mg by mouth at bedtime.  Marland Kitchen. oxycodone (OXY-IR) 5 MG capsule Take 5 mg by mouth every 4 (four) hours as needed. Normally takes 1-2 times per day.  . pantoprazole (PROTONIX) 20 MG tablet Take 40 mg by mouth 2 (two) times daily.  . pregabalin (LYRICA) 50 MG capsule Take 50 mg by mouth 2 (two) times daily.  . rivaroxaban (XARELTO) 20 MG TABS tablet Take 20 mg by mouth daily with supper.  . topiramate (TOPAMAX) 200 MG tablet Take 200 mg by mouth 2 (two) times daily.   No  facility-administered encounter medications on file as of 05/08/2018.     Functional Status:   In your present state of health, do you have any difficulty performing the following activities: 05/08/2018  Hearing? Y  Vision? Y  Comment wears reading glasses  Difficulty concentrating or making decisions? Y  Walking or climbing stairs? Y  Dressing or bathing? Y  Doing errands, shopping? Y  Preparing Food and eating ? N  Using the Toilet? N  In the past six months, have you accidently leaked urine? Y  Do you have problems with loss of bowel control? Y  Managing your Medications? Y  Comment husband manages all medications  Managing your Finances? N  Housekeeping or managing your Housekeeping? N  Some recent data might be hidden    Fall/Depression Screening:    Fall Risk  05/08/2018  Falls in the past year? 1  Number falls in past yr: 1  Injury with Fall? 1  Risk for fall due to : History of fall(s)  Follow up Falls evaluation completed;Falls prevention discussed;Education provided   PHQ 2/9 Scores 05/08/2018 05/08/2018 03/25/2018  PHQ - 2 Score 3 0 0  PHQ- 9 Score 10 - -    Assessment:   (1)Reviewed [facility] program and provided a new patient packet. [facility].  Reviewed consent and consent obtained.  (2) fall risk (3) DM under good control with Hgb A1c of 5.8.  Diet controlled. (4) a fib:  Denies heart racing. Takes medications as prescribed. (5) Copd:  Takes medications as prescribed.  (6) abdominal pain with pancreatitis is identified need from patient.  (7) no advanced directive.  ( 8) has had follow up with primary MD  Plan:  (1) consent reviewed on placed in medical record. (2) Reviewed importance of being active and building up endurance.  (3) Provided [facility] DM blue packet with information about DM for patient to read.  Encouraged patient to continue to be mindful of her diet. (4) Reviewed A fib zones and when to call MD.  Provided written zones [facility]  [facility]. (5) Reviewed COPD zones and when to call MD.  Encouraged patient to report new symptoms to MD.  (6)  Reviewed abdominal plan and plan of care for pain. Encouraged patient to discuss abdominal cramping with MD and request medications for cramping.  Patient reports MD thought pain may be related to medications. Will ask pharmacy to review medication for any current meds causing worsening pancreatitis or abdominal cramping  (7) Provided advanced directive packet. Reviewed how to complete with patient and husband. (8) Encouraged patient to continue to follow up with MD as planned.   Care planning and goal setting during home visit and primary goal is to avoid returning to the hospital.   This note and barrier letter send to MD.  [facility] who reports that continue to assess and  follow the patient. Unknown date of discharge from home health.  Next outreach in 30 days via phone. Encouraged patient and or husband to call me sooner if needed. TH[facility] CM Care Plan Problem One     Most Recent Value  Care Plan Problem One  Recurrent admission to the hospital for the last 3 months.   Role Documenting the Problem One  Care Management Coordinator  Care Plan for Problem One  Active  [facility]N Long Term Goal   Patient will report no admissions to the hospital for COPD or abdominal pain for the next 60 days.   THN Long Term Goal Start Date  05/08/18  Interventions for Problem One Long Term Goal  home visit completed. Collaboration with Amy [facility] home health nurse. Reviewed with patient the importnace of early recognition of symptoms and to reports new or change in symptoms to MD immediately.   [facility] CM Short Term Goal #1   Patient will report talking to Dr. Jennye Boroughs about for abdominal cramping. at next office visit on 05/28/2018.  TH[facility] CM Short Term Goal #1 Start Date  05/08/18  Interventions for Short Term Goal #1  Reviewed importance of communicating with MD about new  or changes in symptoms.   THN CM Short Term Goal #2   Patient will report walking to the mailbox 2 times per week for the next 30 days.   [facility]N CM Short Term Goal #2 Start Date  05/08/18  Interventions for Short Term Goal #2  Enocuraged patient to be more active with the supervision of her husband to help gain strength and keep GI system moving well.     Rowe Pavy, RN, BSN, CEN [facility] [employer] [phone]

## 2018-05-12 ENCOUNTER — Telehealth: Payer: Self-pay | Admitting: Pharmacist

## 2018-05-12 NOTE — Patient Outreach (Signed)
[facility]) Care Management  05/12/2018  Jenny Kelly [birth year] [id number]   Called patient regarding abdominal side effects from her medications. HIPAA identifiers were obtained. Once I explained why I was calling, patient requested that I call her back to review her medications because her husband was not there at the time of my call.  Plan: Call patient back within 3-5 business days.   Beecher Mcardle, PharmD, BCACP [facility] Clinical Pharmacist [phone]

## 2018-05-13 DIAGNOSIS — J449 Chronic obstructive pulmonary disease, unspecified: Secondary | ICD-10-CM | POA: Diagnosis not present

## 2018-05-13 DIAGNOSIS — I482 Chronic atrial fibrillation, unspecified: Secondary | ICD-10-CM | POA: Diagnosis not present

## 2018-05-13 DIAGNOSIS — E1122 Type 2 diabetes mellitus with diabetic chronic kidney disease: Secondary | ICD-10-CM | POA: Diagnosis not present

## 2018-05-13 DIAGNOSIS — N183 Chronic kidney disease, stage 3 (moderate): Secondary | ICD-10-CM | POA: Diagnosis not present

## 2018-05-13 DIAGNOSIS — D631 Anemia in chronic kidney disease: Secondary | ICD-10-CM | POA: Diagnosis not present

## 2018-05-13 DIAGNOSIS — I25700 Atherosclerosis of coronary artery bypass graft(s), unspecified, with unstable angina pectoris: Secondary | ICD-10-CM | POA: Diagnosis not present

## 2018-05-13 DIAGNOSIS — K2950 Unspecified chronic gastritis without bleeding: Secondary | ICD-10-CM | POA: Diagnosis not present

## 2018-05-13 DIAGNOSIS — F0391 Unspecified dementia with behavioral disturbance: Secondary | ICD-10-CM | POA: Diagnosis not present

## 2018-05-13 DIAGNOSIS — I129 Hypertensive chronic kidney disease with stage 1 through stage 4 chronic kidney disease, or unspecified chronic kidney disease: Secondary | ICD-10-CM | POA: Diagnosis not present

## 2018-05-16 ENCOUNTER — Other Ambulatory Visit: Payer: Self-pay | Admitting: Pharmacist

## 2018-05-16 NOTE — Patient Outreach (Signed)
[facility]) Care Management  [facility] CM Pharmacy  05/16/2018  Jenny Kelly 1945/07/28 [id number]  Reason for referral: Medication Review and f/u regarding abdominal pain  Referral source: [facility] RN Current insurance:[employer]  PMHx includes but not limited to:  Recent hospitalization for pancreatitis, MI with stents (3/02), stroke (7/09), headaches since her stroke, seizures, PE on chronic anticoagulation, atrial fibrillation, CKD (not on dialysis), COPD, and HLD  Outreach:  Successful telephone call with patient's husband, Jenny Kelly.  HIPAA identifiers verified.   Subjective:  Patent's husband reports patient is adherent with medications. He reports that patient's abdominal pain has improved since being discharged from the hospital with pancreatitis. Per patient's gastroenterologist, it will take a while for her abdominal pain to completely resolve. She has follow-up appointment with GI on 05/28/18.   Objective: No results found for: CREATININE  No results found for: HGBA1C  Lipid Panel  No results found for: CHOL, TRIG, HDL, CHOLHDL, VLDL, LDLCALC, LDLDIRECT  BP Readings from Last 3 Encounters:  05/08/18 120/62   Allergies  Allergen Reactions  . Azithromycin   . Latex   . Prednisone   . Prochlorperazine Edisylate     Cant swallow  . Reduced Iso-Alpha Acids Complex   . Statins   . Sulfa Antibiotics Swelling   Medications Reviewed Today    Reviewed by Jenny Menghini, RN (Registered Nurse) on 05/08/18 at 1210  Med List Status: <None>  Medication Order Taking? Sig Documenting Provider Last Dose Status Informant  albuterol (PROVENTIL HFA;VENTOLIN HFA) 108 (90 Base) MCG/ACT inhaler [id number] Yes Inhale 2 puffs into the lungs every 6 (six) hours as needed for wheezing or shortness of breath. [provider] Taking Active Self  amiodarone (PACERONE) 100 MG tablet [id number] Yes Take 50 mg by mouth daily. [provider] Taking Active Self  calcium  carbonate (TUMS - DOSED IN MG ELEMENTAL CALCIUM) 500 MG chewable tablet [id number] Yes Chew 1 tablet by mouth as needed for indigestion or heartburn. [provider] Taking Active Self  donepezil (ARICEPT) 10 MG tablet [id number] Yes Take 10 mg by mouth at bedtime. [provider] Taking Active Self  ergocalciferol (VITAMIN D2) 1.25 MG (50000 UT) capsule [id number] Yes Take 50,000 Units by mouth See admin instructions. Takes twice a week on Wednesday and Saturday [provider] Taking Active Self  furosemide (LASIX) 10 MG/ML solution [id number] Yes Take 20 mg by mouth as needed. [provider]  Active Self  ipratropium-albuterol (DUONEB) 0.5-2.5 (3) MG/3ML SOLN [id number] Yes Take 3 mLs by nebulization 2 (two) times daily after a meal. [provider] Taking Active Self  ketorolac (TORADOL) 10 MG tablet [id number] Yes Take 10 mg by mouth every 6 (six) hours as needed. [provider] Taking Active Self  levothyroxine (SYNTHROID, LEVOTHROID) 100 MCG tablet [id number] Yes Take 100 mcg by mouth daily before breakfast. [provider] Taking Active Self  lipase/protease/amylase (CREON) 12000 units CPEP capsule [id number] Yes Take 24,000 Units by mouth 3 (three) times daily before meals. [provider] Taking Active Self  montelukast (SINGULAIR) 10 MG tablet [id number] Yes Take 10 mg by mouth at bedtime. [provider] Taking Active Self  oxycodone (OXY-IR) 5 MG capsule [id number] Yes Take 5 mg by mouth every 4 (four) hours as needed. Normally takes 1-2 times per day. [provider] Taking Active Self  pantoprazole (PROTONIX) 20 MG tablet [id number] Yes Take 40 mg by mouth 2 (two) times daily. [provider] Taking Active Self  pregabalin (  LYRICA) 50 MG capsule [id number] Yes Take 50 mg by mouth 2 (two) times daily. [provider] Taking Active Self  rivaroxaban (XARELTO) 20 MG TABS tablet [id number] Yes Take 20 mg by  mouth daily with supper. [provider] Taking Active Self  topiramate (TOPAMAX) 200 MG tablet [id number] Yes Take 200 mg by mouth 2 (two) times daily. [provider] Taking Active Self         Assessment:  Drugs sorted by system:  Neurologic/Psychologic: donepezil, topiramate  Cardiovascular: amiodarone, furosemide  Pulmonary/Allergy: albuterol HFA, ipratropium-albuterol nebulizer solution, montelukast  Gastrointestinal: pantoprazole  Endocrine: levothyroxine, lipase/protease/amylase (Creon)  Topical: nystatin  Pain: acetaminophen, oxycodone, pregabalin, ketorlac  Vitamins/Minerals/Supplements: calcium carbonate, ergocalciferol   Hematological: rivaroxaban (Xarelto)   Medication Review Findings:  . Discontinued aspirin 81 mg . Taking 6-8 tablets of 500 mg calcium carbonate per day . Hasn't taken ketorolac 10 mg tablet since last week . Taking acetaminophen 500 mg three times a day . Has 40 mg pantoprazole ER tablets, not 20 mg IR tablets . Using nystatin powder PRN 1-2 times per week . Has Aimovig injection in refrigerator, but does not plan to use it  Medication list was updated accordingly.  Medication Assistance Findings:  No medication assistance needs identified  Plan: Will close [facility] pharmacy case as no further medication needs identified at this time.  Am happy to assist in the future as needed.     Katie Lanae Federer PharmD Candidate, Class of 2020 [employer]

## 2018-05-27 DIAGNOSIS — D631 Anemia in chronic kidney disease: Secondary | ICD-10-CM | POA: Diagnosis not present

## 2018-05-27 DIAGNOSIS — E1122 Type 2 diabetes mellitus with diabetic chronic kidney disease: Secondary | ICD-10-CM | POA: Diagnosis not present

## 2018-05-27 DIAGNOSIS — J449 Chronic obstructive pulmonary disease, unspecified: Secondary | ICD-10-CM | POA: Diagnosis not present

## 2018-05-27 DIAGNOSIS — I129 Hypertensive chronic kidney disease with stage 1 through stage 4 chronic kidney disease, or unspecified chronic kidney disease: Secondary | ICD-10-CM | POA: Diagnosis not present

## 2018-05-27 DIAGNOSIS — I25700 Atherosclerosis of coronary artery bypass graft(s), unspecified, with unstable angina pectoris: Secondary | ICD-10-CM | POA: Diagnosis not present

## 2018-05-27 DIAGNOSIS — I482 Chronic atrial fibrillation, unspecified: Secondary | ICD-10-CM | POA: Diagnosis not present

## 2018-05-27 DIAGNOSIS — K2950 Unspecified chronic gastritis without bleeding: Secondary | ICD-10-CM | POA: Diagnosis not present

## 2018-05-27 DIAGNOSIS — N183 Chronic kidney disease, stage 3 (moderate): Secondary | ICD-10-CM | POA: Diagnosis not present

## 2018-05-27 DIAGNOSIS — F0391 Unspecified dementia with behavioral disturbance: Secondary | ICD-10-CM | POA: Diagnosis not present

## 2018-05-28 DIAGNOSIS — K222 Esophageal obstruction: Secondary | ICD-10-CM | POA: Diagnosis not present

## 2018-05-28 DIAGNOSIS — R1013 Epigastric pain: Secondary | ICD-10-CM | POA: Diagnosis not present

## 2018-06-09 ENCOUNTER — Other Ambulatory Visit: Payer: Self-pay

## 2018-06-09 NOTE — Patient Outreach (Signed)
30 day telephone follow up:  Placed call to patient who answered and reports that she is doing well. Reports recent problems with sinus issues. Reports she is taking over the counter medications for this. Also states that she will see primary MD in 1 week.  States she followed up with GI and that she was placed on bentyl and is feeling better. Reports she is walking more and being more active. States no new falls. Reports home health remain active for 1 more home visit.   PLAN: encouraged patient to report any signs of infection to MD. Reviewed this would include color change in nasal drainage. Reviewed with patient that I would follow up in 1 month. Encouraged patient to call me sooner if needed.  Rowe Pavy, RN, BSN, CEN [facility] [employer] [phone]

## 2018-06-13 DIAGNOSIS — N183 Chronic kidney disease, stage 3 (moderate): Secondary | ICD-10-CM | POA: Diagnosis not present

## 2018-06-13 DIAGNOSIS — J449 Chronic obstructive pulmonary disease, unspecified: Secondary | ICD-10-CM | POA: Diagnosis not present

## 2018-06-13 DIAGNOSIS — D631 Anemia in chronic kidney disease: Secondary | ICD-10-CM | POA: Diagnosis not present

## 2018-06-13 DIAGNOSIS — I25700 Atherosclerosis of coronary artery bypass graft(s), unspecified, with unstable angina pectoris: Secondary | ICD-10-CM | POA: Diagnosis not present

## 2018-06-13 DIAGNOSIS — E1122 Type 2 diabetes mellitus with diabetic chronic kidney disease: Secondary | ICD-10-CM | POA: Diagnosis not present

## 2018-06-13 DIAGNOSIS — F0391 Unspecified dementia with behavioral disturbance: Secondary | ICD-10-CM | POA: Diagnosis not present

## 2018-06-13 DIAGNOSIS — K2950 Unspecified chronic gastritis without bleeding: Secondary | ICD-10-CM | POA: Diagnosis not present

## 2018-06-13 DIAGNOSIS — I482 Chronic atrial fibrillation, unspecified: Secondary | ICD-10-CM | POA: Diagnosis not present

## 2018-06-13 DIAGNOSIS — I129 Hypertensive chronic kidney disease with stage 1 through stage 4 chronic kidney disease, or unspecified chronic kidney disease: Secondary | ICD-10-CM | POA: Diagnosis not present

## 2018-06-16 DIAGNOSIS — I2510 Atherosclerotic heart disease of native coronary artery without angina pectoris: Secondary | ICD-10-CM | POA: Diagnosis not present

## 2018-06-16 DIAGNOSIS — Z1389 Encounter for screening for other disorder: Secondary | ICD-10-CM | POA: Diagnosis not present

## 2018-06-16 DIAGNOSIS — Z0000 Encounter for general adult medical examination without abnormal findings: Secondary | ICD-10-CM | POA: Diagnosis not present

## 2018-06-16 DIAGNOSIS — I5032 Chronic diastolic (congestive) heart failure: Secondary | ICD-10-CM | POA: Diagnosis not present

## 2018-06-16 DIAGNOSIS — E119 Type 2 diabetes mellitus without complications: Secondary | ICD-10-CM | POA: Diagnosis not present

## 2018-06-16 DIAGNOSIS — I4891 Unspecified atrial fibrillation: Secondary | ICD-10-CM | POA: Diagnosis not present

## 2018-06-16 DIAGNOSIS — E538 Deficiency of other specified B group vitamins: Secondary | ICD-10-CM | POA: Diagnosis not present

## 2018-06-16 DIAGNOSIS — R413 Other amnesia: Secondary | ICD-10-CM | POA: Diagnosis not present

## 2018-06-16 DIAGNOSIS — I10 Essential (primary) hypertension: Secondary | ICD-10-CM | POA: Diagnosis not present

## 2018-06-16 DIAGNOSIS — E039 Hypothyroidism, unspecified: Secondary | ICD-10-CM | POA: Diagnosis not present

## 2018-06-16 DIAGNOSIS — E1122 Type 2 diabetes mellitus with diabetic chronic kidney disease: Secondary | ICD-10-CM | POA: Diagnosis not present

## 2018-06-16 DIAGNOSIS — R05 Cough: Secondary | ICD-10-CM | POA: Diagnosis not present

## 2018-06-16 DIAGNOSIS — N183 Chronic kidney disease, stage 3 (moderate): Secondary | ICD-10-CM | POA: Diagnosis not present

## 2018-06-20 ENCOUNTER — Other Ambulatory Visit: Payer: Self-pay

## 2018-06-20 DIAGNOSIS — I2510 Atherosclerotic heart disease of native coronary artery without angina pectoris: Secondary | ICD-10-CM | POA: Diagnosis not present

## 2018-06-20 DIAGNOSIS — E538 Deficiency of other specified B group vitamins: Secondary | ICD-10-CM | POA: Diagnosis not present

## 2018-06-20 DIAGNOSIS — I10 Essential (primary) hypertension: Secondary | ICD-10-CM | POA: Diagnosis not present

## 2018-06-20 DIAGNOSIS — E039 Hypothyroidism, unspecified: Secondary | ICD-10-CM | POA: Diagnosis not present

## 2018-06-20 DIAGNOSIS — E119 Type 2 diabetes mellitus without complications: Secondary | ICD-10-CM | POA: Diagnosis not present

## 2018-06-20 NOTE — Patient Outreach (Signed)
Telephone assessment: Incoming call from patient to inform me that she has pneumonia. Reports she is on antibiotics and is using her neb machine.    PLAN: encouraged patient to eat and drink well. Reviewed importance of taking all medications as prescribed. Will plan follow up call in 1 week.  Rowe Pavy, RN, BSN, CEN [facility] [employer] [phone]

## 2018-06-26 ENCOUNTER — Other Ambulatory Visit: Payer: Self-pay

## 2018-06-26 NOTE — Patient Outreach (Signed)
Telephone assessment:  Follow up call to patient after outreach last week.   Placed call to patient who was asleep.    Spoke with husband who reports patient is breathing better.  Reports newly diagnosed with anemia.   PLAN: Follow up in 1 week.  Rowe Pavy, RN, BSN, CEN [facility] [employer] [phone]

## 2018-06-30 DIAGNOSIS — Z1212 Encounter for screening for malignant neoplasm of rectum: Secondary | ICD-10-CM | POA: Diagnosis not present

## 2018-07-03 ENCOUNTER — Other Ambulatory Visit: Payer: Self-pay

## 2018-07-03 NOTE — Patient Outreach (Signed)
Telephone assessment:  Placed call to patient who reports she is doing well. Reports breathing is better. Reports productive cough with clear sputum.  States she was diagnosed with anemia. States she took her stool cards to Md office and is awaiting results. Reports she will have an MRI of stomach on Monday 07/07/2018.  Denies any new symptoms except weakness.  PLAN: will plan follow up call in 1 week.  Rowe Pavy, RN, BSN, CEN [facility] [employer] [phone]

## 2018-07-07 DIAGNOSIS — I700 Atherosclerosis of aorta: Secondary | ICD-10-CM | POA: Diagnosis not present

## 2018-07-07 DIAGNOSIS — R109 Unspecified abdominal pain: Secondary | ICD-10-CM | POA: Diagnosis not present

## 2018-07-07 DIAGNOSIS — K859 Acute pancreatitis without necrosis or infection, unspecified: Secondary | ICD-10-CM | POA: Diagnosis not present

## 2018-07-10 ENCOUNTER — Other Ambulatory Visit: Payer: Self-pay

## 2018-07-10 NOTE — Patient Outreach (Signed)
Telephone follow up:  Placed call to patient who answered. Reports she is feeling well today. Reports breathing is good right now. Reports allergy symptoms like runny nose, itchy eyes.  Reports she is staying active and avoid contacts with others. Reports she had her MRI of the abdomen to determine site of bleeding this week and is awaiting results.   Reviewed importance of being active around the home. Reviewed signs of bleeding.   PLAN: telephone follow up in 2 weeks.  Rowe Pavy, RN, BSN, CEN [facility] [employer] [phone]

## 2018-07-16 DIAGNOSIS — K2970 Gastritis, unspecified, without bleeding: Secondary | ICD-10-CM | POA: Diagnosis not present

## 2018-07-16 DIAGNOSIS — K8591 Acute pancreatitis with uninfected necrosis, unspecified: Secondary | ICD-10-CM | POA: Diagnosis not present

## 2018-07-24 ENCOUNTER — Other Ambulatory Visit: Payer: Self-pay

## 2018-07-24 NOTE — Patient Outreach (Signed)
Telephone assessment:  Placed call to patient who answered and reports she is doing well. Reports GI study went well and she is recommended to have a colonoscope after virus clears. Patient reports her breathing is good. Reports she continues to have nasal drainage and cough with slight wheezing.  Reports she continues to use her nebulizer.    PLAN: will follow up in 2 weeks. Encouraged patient to avoid sick contacts. Reviewed with patient when to call MD for new symptoms or concerns.  Rowe Pavy, RN, BSN, CEN [facility] [employer] [phone]

## 2018-08-11 ENCOUNTER — Other Ambulatory Visit: Payer: Self-pay

## 2018-08-11 NOTE — Patient Outreach (Signed)
Case closure telephone assessment:  Placed call to patient who answered and reports that she is doing well. Reports she continues to have a lot of sinus/ nasal drainage. Reports slight cough and no fever. Reports that she continues to use her nebulizer and take her medications as prescribed.  Denies any GI bleeding at this time.   Reviewed goals achieved with patient. Patient denies any new goals at this time. Will plan for case closure. Reviewed with patient to call me if needed in the future.  PLAN: close case goals met. Will notify MD and send patient case closure letter.  Tomasa Rand, RN, BSN, CEN [facility] [employer] [phone]

## 2018-09-17 DIAGNOSIS — F330 Major depressive disorder, recurrent, mild: Secondary | ICD-10-CM | POA: Diagnosis not present

## 2018-09-17 DIAGNOSIS — D649 Anemia, unspecified: Secondary | ICD-10-CM | POA: Diagnosis not present

## 2018-09-17 DIAGNOSIS — E039 Hypothyroidism, unspecified: Secondary | ICD-10-CM | POA: Diagnosis not present

## 2018-10-16 DIAGNOSIS — K861 Other chronic pancreatitis: Secondary | ICD-10-CM | POA: Diagnosis not present

## 2018-10-16 DIAGNOSIS — K591 Functional diarrhea: Secondary | ICD-10-CM | POA: Diagnosis not present

## 2018-10-16 DIAGNOSIS — K2970 Gastritis, unspecified, without bleeding: Secondary | ICD-10-CM | POA: Diagnosis not present

## 2018-11-27 DIAGNOSIS — R05 Cough: Secondary | ICD-10-CM | POA: Diagnosis not present

## 2018-11-27 DIAGNOSIS — J189 Pneumonia, unspecified organism: Secondary | ICD-10-CM | POA: Diagnosis not present

## 2018-11-27 DIAGNOSIS — J449 Chronic obstructive pulmonary disease, unspecified: Secondary | ICD-10-CM | POA: Diagnosis not present

## 2018-11-27 DIAGNOSIS — Z951 Presence of aortocoronary bypass graft: Secondary | ICD-10-CM | POA: Diagnosis not present

## 2018-11-27 DIAGNOSIS — J984 Other disorders of lung: Secondary | ICD-10-CM | POA: Diagnosis not present

## 2018-12-09 ENCOUNTER — Telehealth: Payer: Self-pay

## 2018-12-11 ENCOUNTER — Other Ambulatory Visit: Payer: Self-pay | Admitting: *Deleted

## 2018-12-11 NOTE — Patient Outreach (Addendum)
[facility]) Care Management  12/11/2018  Jenny Kelly 1945/09/11 [id number]   Subjective: Telephone call to patient's home number, spoke with patient, and HIPAA verified.  Discussed [facility] Care Management [employer] Medicare Join EMMI referral follow up, patient voiced understanding, and is in agreement to follow up.  Patient states she is doing well, remembers speaking with referral source, and has had [place] Management services in the past.   States she has an appointment with primary MD on 12/17/2018 and will be getting her flu shot.   States she recently fell, has a bruise on left wrist, no swelling, and no impaired movement.  States she is planning to report fall to MD during office visit on 12/17/2018.  Patient states she is aware of signs/ symptoms to report, how to reach provider if needed after hours, when to go to ED, and / or call 911.  Discussed importance of follow up with primary MD regarding fall, patient voices understanding, and states she will follow up as appropriate.  Patient states she is able to manage some self care and has assistance as needed.  Patient voices understanding of medical diagnosis and treatment plan.  Discussed Depression Screening results, patient voiced understanding, states she has a history of depression, sometimes has a "pity party for self", takes medications for depression, is aware of signs / symptoms to report, she will follow up with primary MD as needed,  declined referral to [place] Worker for depression screening follow up and counseling resources.  Discussed Advanced Directives, advised of [place] [facility] Social Worker Advanced Directives document completion benefit, patient voices understanding, and  declined to access benefit at this time.   Patient is in agreement to receive Advanced Directive packet and will contact [place] Management is services needed in the future.   Patient states she would like the following EMMI  educational material sent to her via mail: Diabetes and Diet, Coronavirus Disease 2019 (COVID-19) Overview.  States she is accessing her [facility] Medicare benefits as needed via member services number on back of card.  Patient states she does not have any  transition of care, care coordination, disease management, disease monitoring, transportation, community resource, or pharmacy needs at this time. States she is very appreciative of the follow up and is in agreement to receive [place] Management information.     Objective: Per KPN (Knowledge Performance Now, point of care tool) and chart review, patient has had no recent ED visits or hospitalizations.  Patient also has a history of COPD, diabetes, hypothyroidism, chronic kidney disease stage 3, and Atherosclerotic heart disease of native coronary artery.    Assessment: Received [employer] Medicare Join EMMI referral on 12/09/2018.   Referral source: [facility].   Referral reason: Patient Engagement Tool score.  Screening follow up completed, patient declined services, and will proceed with case closure.      Plan:  RNCM will send patient successful outreach letter, [facility] pamphlet, Advanced Directives packet, and magnet. RNCM will send patient the following EMMI educational materials per her request via mail: EMMI educational material sent to her via mail: Diabetes and Diet, Coronavirus Disease 2019 (COVID-19) Overview.   RNCM will close case due to patient declining [facility] Care Management services. RNCM will send MD case closure letter.     Shonteria Abeln H. Annia Friendly, BSN, [place] Management [facility] Telephonic CM Phone: [phone] Fax: [phone]

## 2018-12-17 DIAGNOSIS — D649 Anemia, unspecified: Secondary | ICD-10-CM | POA: Diagnosis not present

## 2018-12-17 DIAGNOSIS — E039 Hypothyroidism, unspecified: Secondary | ICD-10-CM | POA: Diagnosis not present

## 2018-12-17 DIAGNOSIS — J302 Other seasonal allergic rhinitis: Secondary | ICD-10-CM | POA: Diagnosis not present

## 2018-12-17 DIAGNOSIS — J449 Chronic obstructive pulmonary disease, unspecified: Secondary | ICD-10-CM | POA: Diagnosis not present

## 2018-12-25 DIAGNOSIS — R1011 Right upper quadrant pain: Secondary | ICD-10-CM | POA: Diagnosis not present

## 2018-12-25 DIAGNOSIS — K591 Functional diarrhea: Secondary | ICD-10-CM | POA: Diagnosis not present

## 2018-12-25 DIAGNOSIS — K2970 Gastritis, unspecified, without bleeding: Secondary | ICD-10-CM | POA: Diagnosis not present

## 2018-12-30 DIAGNOSIS — E538 Deficiency of other specified B group vitamins: Secondary | ICD-10-CM | POA: Diagnosis not present

## 2018-12-30 DIAGNOSIS — D649 Anemia, unspecified: Secondary | ICD-10-CM | POA: Diagnosis not present

## 2019-02-17 DIAGNOSIS — Z23 Encounter for immunization: Secondary | ICD-10-CM | POA: Diagnosis not present

## 2019-03-24 DIAGNOSIS — D649 Anemia, unspecified: Secondary | ICD-10-CM | POA: Diagnosis not present

## 2019-03-24 DIAGNOSIS — E039 Hypothyroidism, unspecified: Secondary | ICD-10-CM | POA: Diagnosis not present

## 2019-07-16 DIAGNOSIS — I25810 Atherosclerosis of coronary artery bypass graft(s) without angina pectoris: Secondary | ICD-10-CM | POA: Diagnosis not present

## 2019-07-16 DIAGNOSIS — E1122 Type 2 diabetes mellitus with diabetic chronic kidney disease: Secondary | ICD-10-CM | POA: Diagnosis not present

## 2019-07-16 DIAGNOSIS — I10 Essential (primary) hypertension: Secondary | ICD-10-CM | POA: Diagnosis not present

## 2019-07-16 DIAGNOSIS — I679 Cerebrovascular disease, unspecified: Secondary | ICD-10-CM | POA: Diagnosis not present

## 2019-07-16 DIAGNOSIS — I4891 Unspecified atrial fibrillation: Secondary | ICD-10-CM | POA: Diagnosis not present

## 2019-07-16 DIAGNOSIS — R413 Other amnesia: Secondary | ICD-10-CM | POA: Diagnosis not present

## 2019-07-16 DIAGNOSIS — Z1389 Encounter for screening for other disorder: Secondary | ICD-10-CM | POA: Diagnosis not present

## 2019-07-16 DIAGNOSIS — E119 Type 2 diabetes mellitus without complications: Secondary | ICD-10-CM | POA: Diagnosis not present

## 2019-07-16 DIAGNOSIS — E7800 Pure hypercholesterolemia, unspecified: Secondary | ICD-10-CM | POA: Diagnosis not present

## 2019-07-16 DIAGNOSIS — N1831 Chronic kidney disease, stage 3a: Secondary | ICD-10-CM | POA: Diagnosis not present

## 2019-07-16 DIAGNOSIS — Z0000 Encounter for general adult medical examination without abnormal findings: Secondary | ICD-10-CM | POA: Diagnosis not present

## 2019-07-16 DIAGNOSIS — F330 Major depressive disorder, recurrent, mild: Secondary | ICD-10-CM | POA: Diagnosis not present

## 2019-07-22 DIAGNOSIS — Z1231 Encounter for screening mammogram for malignant neoplasm of breast: Secondary | ICD-10-CM | POA: Diagnosis not present

## 2019-07-22 DIAGNOSIS — E876 Hypokalemia: Secondary | ICD-10-CM | POA: Diagnosis not present

## 2019-07-28 DIAGNOSIS — I10 Essential (primary) hypertension: Secondary | ICD-10-CM | POA: Diagnosis not present

## 2019-07-28 DIAGNOSIS — J189 Pneumonia, unspecified organism: Secondary | ICD-10-CM | POA: Diagnosis not present

## 2019-07-28 DIAGNOSIS — R0602 Shortness of breath: Secondary | ICD-10-CM | POA: Diagnosis not present

## 2019-07-28 DIAGNOSIS — J440 Chronic obstructive pulmonary disease with acute lower respiratory infection: Secondary | ICD-10-CM | POA: Diagnosis not present

## 2019-07-28 DIAGNOSIS — N179 Acute kidney failure, unspecified: Secondary | ICD-10-CM | POA: Diagnosis not present

## 2019-07-28 DIAGNOSIS — N390 Urinary tract infection, site not specified: Secondary | ICD-10-CM | POA: Diagnosis not present

## 2019-07-28 DIAGNOSIS — J9601 Acute respiratory failure with hypoxia: Secondary | ICD-10-CM | POA: Diagnosis not present

## 2019-07-28 DIAGNOSIS — G9340 Encephalopathy, unspecified: Secondary | ICD-10-CM | POA: Diagnosis not present

## 2019-07-28 DIAGNOSIS — I491 Atrial premature depolarization: Secondary | ICD-10-CM | POA: Diagnosis not present

## 2019-07-28 DIAGNOSIS — J159 Unspecified bacterial pneumonia: Secondary | ICD-10-CM | POA: Diagnosis not present

## 2019-07-28 DIAGNOSIS — R4020 Unspecified coma: Secondary | ICD-10-CM | POA: Diagnosis not present

## 2019-07-28 DIAGNOSIS — E876 Hypokalemia: Secondary | ICD-10-CM | POA: Diagnosis not present

## 2019-07-28 DIAGNOSIS — R05 Cough: Secondary | ICD-10-CM | POA: Diagnosis not present

## 2019-07-28 DIAGNOSIS — R0902 Hypoxemia: Secondary | ICD-10-CM | POA: Diagnosis not present

## 2019-07-28 DIAGNOSIS — U071 COVID-19: Secondary | ICD-10-CM | POA: Diagnosis not present

## 2019-07-28 DIAGNOSIS — I959 Hypotension, unspecified: Secondary | ICD-10-CM | POA: Diagnosis not present

## 2019-07-28 DIAGNOSIS — J1282 Pneumonia due to coronavirus disease 2019: Secondary | ICD-10-CM | POA: Diagnosis not present

## 2019-07-28 DIAGNOSIS — E870 Hyperosmolality and hypernatremia: Secondary | ICD-10-CM | POA: Diagnosis not present

## 2019-07-29 DIAGNOSIS — U071 COVID-19: Secondary | ICD-10-CM | POA: Diagnosis not present

## 2019-07-30 DIAGNOSIS — U071 COVID-19: Secondary | ICD-10-CM | POA: Diagnosis not present

## 2019-07-31 DIAGNOSIS — R0602 Shortness of breath: Secondary | ICD-10-CM | POA: Diagnosis not present

## 2019-07-31 DIAGNOSIS — U071 COVID-19: Secondary | ICD-10-CM | POA: Diagnosis not present

## 2019-08-01 DIAGNOSIS — U071 COVID-19: Secondary | ICD-10-CM | POA: Diagnosis not present

## 2019-08-01 DIAGNOSIS — I10 Essential (primary) hypertension: Secondary | ICD-10-CM | POA: Diagnosis not present

## 2019-08-01 DIAGNOSIS — J189 Pneumonia, unspecified organism: Secondary | ICD-10-CM | POA: Diagnosis not present

## 2019-08-02 DIAGNOSIS — U071 COVID-19: Secondary | ICD-10-CM | POA: Diagnosis not present

## 2019-08-03 DIAGNOSIS — U071 COVID-19: Secondary | ICD-10-CM | POA: Diagnosis not present

## 2019-08-04 DIAGNOSIS — U071 COVID-19: Secondary | ICD-10-CM | POA: Diagnosis not present

## 2019-08-05 DIAGNOSIS — U071 COVID-19: Secondary | ICD-10-CM | POA: Diagnosis not present

## 2019-08-06 DIAGNOSIS — U071 COVID-19: Secondary | ICD-10-CM | POA: Diagnosis not present

## 2019-08-07 DIAGNOSIS — U071 COVID-19: Secondary | ICD-10-CM | POA: Diagnosis not present

## 2019-08-08 DIAGNOSIS — U071 COVID-19: Secondary | ICD-10-CM | POA: Diagnosis not present

## 2019-08-09 DIAGNOSIS — U071 COVID-19: Secondary | ICD-10-CM | POA: Diagnosis not present

## 2019-08-10 DIAGNOSIS — U071 COVID-19: Secondary | ICD-10-CM | POA: Diagnosis not present

## 2019-08-11 DIAGNOSIS — U071 COVID-19: Secondary | ICD-10-CM | POA: Diagnosis not present

## 2019-08-14 ENCOUNTER — Other Ambulatory Visit: Payer: Self-pay

## 2019-08-14 NOTE — Patient Outreach (Signed)
[facility]) Care Management  08/14/2019  LENIYA BREIT 1945/12/21 [id number]     Transition of Care Referral  Referral Date: 08/14/2019 Referral Source: [facility] Discharge Report Date of Discharge: 08/12/2019 Facility: [facility] Insurance: [facility]    Referral received. PCP office listed as completing TOC call and will refer to [facility] as needed.     Plan: RN CM will close case at this time.    Antionette Fairy, RN,BSN,CCM [facility] Care Management Telephonic Care Management Coordinator Direct Phone: [phone] Toll Free: [phone] Fax: [phone]

## 2019-08-15 DIAGNOSIS — B961 Klebsiella pneumoniae [K. pneumoniae] as the cause of diseases classified elsewhere: Secondary | ICD-10-CM | POA: Diagnosis not present

## 2019-08-15 DIAGNOSIS — J440 Chronic obstructive pulmonary disease with acute lower respiratory infection: Secondary | ICD-10-CM | POA: Diagnosis not present

## 2019-08-15 DIAGNOSIS — E119 Type 2 diabetes mellitus without complications: Secondary | ICD-10-CM | POA: Diagnosis not present

## 2019-08-15 DIAGNOSIS — I10 Essential (primary) hypertension: Secondary | ICD-10-CM | POA: Diagnosis not present

## 2019-08-15 DIAGNOSIS — J1282 Pneumonia due to coronavirus disease 2019: Secondary | ICD-10-CM | POA: Diagnosis not present

## 2019-08-15 DIAGNOSIS — N390 Urinary tract infection, site not specified: Secondary | ICD-10-CM | POA: Diagnosis not present

## 2019-08-15 DIAGNOSIS — J449 Chronic obstructive pulmonary disease, unspecified: Secondary | ICD-10-CM | POA: Diagnosis not present

## 2019-08-15 DIAGNOSIS — I2510 Atherosclerotic heart disease of native coronary artery without angina pectoris: Secondary | ICD-10-CM | POA: Diagnosis not present

## 2019-08-15 DIAGNOSIS — U071 COVID-19: Secondary | ICD-10-CM | POA: Diagnosis not present

## 2019-08-15 DIAGNOSIS — J9601 Acute respiratory failure with hypoxia: Secondary | ICD-10-CM | POA: Diagnosis not present

## 2019-08-18 DIAGNOSIS — N390 Urinary tract infection, site not specified: Secondary | ICD-10-CM | POA: Diagnosis not present

## 2019-08-18 DIAGNOSIS — U071 COVID-19: Secondary | ICD-10-CM | POA: Diagnosis not present

## 2019-08-18 DIAGNOSIS — J440 Chronic obstructive pulmonary disease with acute lower respiratory infection: Secondary | ICD-10-CM | POA: Diagnosis not present

## 2019-08-18 DIAGNOSIS — I2510 Atherosclerotic heart disease of native coronary artery without angina pectoris: Secondary | ICD-10-CM | POA: Diagnosis not present

## 2019-08-18 DIAGNOSIS — I10 Essential (primary) hypertension: Secondary | ICD-10-CM | POA: Diagnosis not present

## 2019-08-18 DIAGNOSIS — E119 Type 2 diabetes mellitus without complications: Secondary | ICD-10-CM | POA: Diagnosis not present

## 2019-08-18 DIAGNOSIS — J9601 Acute respiratory failure with hypoxia: Secondary | ICD-10-CM | POA: Diagnosis not present

## 2019-08-18 DIAGNOSIS — J1282 Pneumonia due to coronavirus disease 2019: Secondary | ICD-10-CM | POA: Diagnosis not present

## 2019-08-18 DIAGNOSIS — B961 Klebsiella pneumoniae [K. pneumoniae] as the cause of diseases classified elsewhere: Secondary | ICD-10-CM | POA: Diagnosis not present

## 2019-08-19 DIAGNOSIS — J9601 Acute respiratory failure with hypoxia: Secondary | ICD-10-CM | POA: Diagnosis not present

## 2019-08-19 DIAGNOSIS — J1282 Pneumonia due to coronavirus disease 2019: Secondary | ICD-10-CM | POA: Diagnosis not present

## 2019-08-19 DIAGNOSIS — I10 Essential (primary) hypertension: Secondary | ICD-10-CM | POA: Diagnosis not present

## 2019-08-19 DIAGNOSIS — I2510 Atherosclerotic heart disease of native coronary artery without angina pectoris: Secondary | ICD-10-CM | POA: Diagnosis not present

## 2019-08-19 DIAGNOSIS — E119 Type 2 diabetes mellitus without complications: Secondary | ICD-10-CM | POA: Diagnosis not present

## 2019-08-19 DIAGNOSIS — B961 Klebsiella pneumoniae [K. pneumoniae] as the cause of diseases classified elsewhere: Secondary | ICD-10-CM | POA: Diagnosis not present

## 2019-08-19 DIAGNOSIS — U071 COVID-19: Secondary | ICD-10-CM | POA: Diagnosis not present

## 2019-08-19 DIAGNOSIS — N390 Urinary tract infection, site not specified: Secondary | ICD-10-CM | POA: Diagnosis not present

## 2019-08-19 DIAGNOSIS — J440 Chronic obstructive pulmonary disease with acute lower respiratory infection: Secondary | ICD-10-CM | POA: Diagnosis not present

## 2019-08-20 DIAGNOSIS — J440 Chronic obstructive pulmonary disease with acute lower respiratory infection: Secondary | ICD-10-CM | POA: Diagnosis not present

## 2019-08-20 DIAGNOSIS — E119 Type 2 diabetes mellitus without complications: Secondary | ICD-10-CM | POA: Diagnosis not present

## 2019-08-20 DIAGNOSIS — J1282 Pneumonia due to coronavirus disease 2019: Secondary | ICD-10-CM | POA: Diagnosis not present

## 2019-08-20 DIAGNOSIS — I10 Essential (primary) hypertension: Secondary | ICD-10-CM | POA: Diagnosis not present

## 2019-08-20 DIAGNOSIS — J9601 Acute respiratory failure with hypoxia: Secondary | ICD-10-CM | POA: Diagnosis not present

## 2019-08-20 DIAGNOSIS — I2510 Atherosclerotic heart disease of native coronary artery without angina pectoris: Secondary | ICD-10-CM | POA: Diagnosis not present

## 2019-08-20 DIAGNOSIS — U071 COVID-19: Secondary | ICD-10-CM | POA: Diagnosis not present

## 2019-08-20 DIAGNOSIS — B961 Klebsiella pneumoniae [K. pneumoniae] as the cause of diseases classified elsewhere: Secondary | ICD-10-CM | POA: Diagnosis not present

## 2019-08-20 DIAGNOSIS — N390 Urinary tract infection, site not specified: Secondary | ICD-10-CM | POA: Diagnosis not present

## 2019-08-24 DIAGNOSIS — I10 Essential (primary) hypertension: Secondary | ICD-10-CM | POA: Diagnosis not present

## 2019-08-24 DIAGNOSIS — N390 Urinary tract infection, site not specified: Secondary | ICD-10-CM | POA: Diagnosis not present

## 2019-08-24 DIAGNOSIS — I2510 Atherosclerotic heart disease of native coronary artery without angina pectoris: Secondary | ICD-10-CM | POA: Diagnosis not present

## 2019-08-24 DIAGNOSIS — J9601 Acute respiratory failure with hypoxia: Secondary | ICD-10-CM | POA: Diagnosis not present

## 2019-08-24 DIAGNOSIS — B961 Klebsiella pneumoniae [K. pneumoniae] as the cause of diseases classified elsewhere: Secondary | ICD-10-CM | POA: Diagnosis not present

## 2019-08-24 DIAGNOSIS — J440 Chronic obstructive pulmonary disease with acute lower respiratory infection: Secondary | ICD-10-CM | POA: Diagnosis not present

## 2019-08-24 DIAGNOSIS — J1282 Pneumonia due to coronavirus disease 2019: Secondary | ICD-10-CM | POA: Diagnosis not present

## 2019-08-24 DIAGNOSIS — U071 COVID-19: Secondary | ICD-10-CM | POA: Diagnosis not present

## 2019-08-24 DIAGNOSIS — E119 Type 2 diabetes mellitus without complications: Secondary | ICD-10-CM | POA: Diagnosis not present

## 2019-08-25 DIAGNOSIS — N390 Urinary tract infection, site not specified: Secondary | ICD-10-CM | POA: Diagnosis not present

## 2019-08-25 DIAGNOSIS — I10 Essential (primary) hypertension: Secondary | ICD-10-CM | POA: Diagnosis not present

## 2019-08-25 DIAGNOSIS — R319 Hematuria, unspecified: Secondary | ICD-10-CM | POA: Diagnosis not present

## 2019-08-25 DIAGNOSIS — J1282 Pneumonia due to coronavirus disease 2019: Secondary | ICD-10-CM | POA: Diagnosis not present

## 2019-08-25 DIAGNOSIS — J9601 Acute respiratory failure with hypoxia: Secondary | ICD-10-CM | POA: Diagnosis not present

## 2019-08-25 DIAGNOSIS — B961 Klebsiella pneumoniae [K. pneumoniae] as the cause of diseases classified elsewhere: Secondary | ICD-10-CM | POA: Diagnosis not present

## 2019-08-25 DIAGNOSIS — I2510 Atherosclerotic heart disease of native coronary artery without angina pectoris: Secondary | ICD-10-CM | POA: Diagnosis not present

## 2019-08-25 DIAGNOSIS — U071 COVID-19: Secondary | ICD-10-CM | POA: Diagnosis not present

## 2019-08-25 DIAGNOSIS — E119 Type 2 diabetes mellitus without complications: Secondary | ICD-10-CM | POA: Diagnosis not present

## 2019-08-25 DIAGNOSIS — J440 Chronic obstructive pulmonary disease with acute lower respiratory infection: Secondary | ICD-10-CM | POA: Diagnosis not present

## 2019-08-27 DIAGNOSIS — I2510 Atherosclerotic heart disease of native coronary artery without angina pectoris: Secondary | ICD-10-CM | POA: Diagnosis not present

## 2019-08-27 DIAGNOSIS — E119 Type 2 diabetes mellitus without complications: Secondary | ICD-10-CM | POA: Diagnosis not present

## 2019-08-27 DIAGNOSIS — J1282 Pneumonia due to coronavirus disease 2019: Secondary | ICD-10-CM | POA: Diagnosis not present

## 2019-08-27 DIAGNOSIS — I10 Essential (primary) hypertension: Secondary | ICD-10-CM | POA: Diagnosis not present

## 2019-08-27 DIAGNOSIS — U071 COVID-19: Secondary | ICD-10-CM | POA: Diagnosis not present

## 2019-08-27 DIAGNOSIS — J440 Chronic obstructive pulmonary disease with acute lower respiratory infection: Secondary | ICD-10-CM | POA: Diagnosis not present

## 2019-08-27 DIAGNOSIS — J9601 Acute respiratory failure with hypoxia: Secondary | ICD-10-CM | POA: Diagnosis not present

## 2019-08-27 DIAGNOSIS — B961 Klebsiella pneumoniae [K. pneumoniae] as the cause of diseases classified elsewhere: Secondary | ICD-10-CM | POA: Diagnosis not present

## 2019-08-27 DIAGNOSIS — N390 Urinary tract infection, site not specified: Secondary | ICD-10-CM | POA: Diagnosis not present

## 2019-08-31 DIAGNOSIS — B961 Klebsiella pneumoniae [K. pneumoniae] as the cause of diseases classified elsewhere: Secondary | ICD-10-CM | POA: Diagnosis not present

## 2019-08-31 DIAGNOSIS — E119 Type 2 diabetes mellitus without complications: Secondary | ICD-10-CM | POA: Diagnosis not present

## 2019-08-31 DIAGNOSIS — U071 COVID-19: Secondary | ICD-10-CM | POA: Diagnosis not present

## 2019-08-31 DIAGNOSIS — I2510 Atherosclerotic heart disease of native coronary artery without angina pectoris: Secondary | ICD-10-CM | POA: Diagnosis not present

## 2019-08-31 DIAGNOSIS — J9601 Acute respiratory failure with hypoxia: Secondary | ICD-10-CM | POA: Diagnosis not present

## 2019-08-31 DIAGNOSIS — J440 Chronic obstructive pulmonary disease with acute lower respiratory infection: Secondary | ICD-10-CM | POA: Diagnosis not present

## 2019-08-31 DIAGNOSIS — N390 Urinary tract infection, site not specified: Secondary | ICD-10-CM | POA: Diagnosis not present

## 2019-08-31 DIAGNOSIS — J1282 Pneumonia due to coronavirus disease 2019: Secondary | ICD-10-CM | POA: Diagnosis not present

## 2019-08-31 DIAGNOSIS — I10 Essential (primary) hypertension: Secondary | ICD-10-CM | POA: Diagnosis not present

## 2019-09-07 DIAGNOSIS — N390 Urinary tract infection, site not specified: Secondary | ICD-10-CM | POA: Diagnosis not present

## 2019-09-07 DIAGNOSIS — I10 Essential (primary) hypertension: Secondary | ICD-10-CM | POA: Diagnosis not present

## 2019-09-07 DIAGNOSIS — J1282 Pneumonia due to coronavirus disease 2019: Secondary | ICD-10-CM | POA: Diagnosis not present

## 2019-09-07 DIAGNOSIS — J9601 Acute respiratory failure with hypoxia: Secondary | ICD-10-CM | POA: Diagnosis not present

## 2019-09-07 DIAGNOSIS — U071 COVID-19: Secondary | ICD-10-CM | POA: Diagnosis not present

## 2019-09-07 DIAGNOSIS — E119 Type 2 diabetes mellitus without complications: Secondary | ICD-10-CM | POA: Diagnosis not present

## 2019-09-07 DIAGNOSIS — J440 Chronic obstructive pulmonary disease with acute lower respiratory infection: Secondary | ICD-10-CM | POA: Diagnosis not present

## 2019-09-07 DIAGNOSIS — I2510 Atherosclerotic heart disease of native coronary artery without angina pectoris: Secondary | ICD-10-CM | POA: Diagnosis not present

## 2019-09-07 DIAGNOSIS — B961 Klebsiella pneumoniae [K. pneumoniae] as the cause of diseases classified elsewhere: Secondary | ICD-10-CM | POA: Diagnosis not present

## 2019-09-10 DIAGNOSIS — N390 Urinary tract infection, site not specified: Secondary | ICD-10-CM | POA: Diagnosis not present

## 2019-09-10 DIAGNOSIS — U071 COVID-19: Secondary | ICD-10-CM | POA: Diagnosis not present

## 2019-09-10 DIAGNOSIS — E119 Type 2 diabetes mellitus without complications: Secondary | ICD-10-CM | POA: Diagnosis not present

## 2019-09-10 DIAGNOSIS — J440 Chronic obstructive pulmonary disease with acute lower respiratory infection: Secondary | ICD-10-CM | POA: Diagnosis not present

## 2019-09-10 DIAGNOSIS — J1282 Pneumonia due to coronavirus disease 2019: Secondary | ICD-10-CM | POA: Diagnosis not present

## 2019-09-10 DIAGNOSIS — J9601 Acute respiratory failure with hypoxia: Secondary | ICD-10-CM | POA: Diagnosis not present

## 2019-09-10 DIAGNOSIS — B961 Klebsiella pneumoniae [K. pneumoniae] as the cause of diseases classified elsewhere: Secondary | ICD-10-CM | POA: Diagnosis not present

## 2019-09-10 DIAGNOSIS — I2510 Atherosclerotic heart disease of native coronary artery without angina pectoris: Secondary | ICD-10-CM | POA: Diagnosis not present

## 2019-09-10 DIAGNOSIS — I10 Essential (primary) hypertension: Secondary | ICD-10-CM | POA: Diagnosis not present

## 2019-09-11 DIAGNOSIS — J440 Chronic obstructive pulmonary disease with acute lower respiratory infection: Secondary | ICD-10-CM | POA: Diagnosis not present

## 2019-09-11 DIAGNOSIS — E119 Type 2 diabetes mellitus without complications: Secondary | ICD-10-CM | POA: Diagnosis not present

## 2019-09-11 DIAGNOSIS — N390 Urinary tract infection, site not specified: Secondary | ICD-10-CM | POA: Diagnosis not present

## 2019-09-11 DIAGNOSIS — J9601 Acute respiratory failure with hypoxia: Secondary | ICD-10-CM | POA: Diagnosis not present

## 2019-09-11 DIAGNOSIS — I10 Essential (primary) hypertension: Secondary | ICD-10-CM | POA: Diagnosis not present

## 2019-09-11 DIAGNOSIS — I2510 Atherosclerotic heart disease of native coronary artery without angina pectoris: Secondary | ICD-10-CM | POA: Diagnosis not present

## 2019-09-11 DIAGNOSIS — U071 COVID-19: Secondary | ICD-10-CM | POA: Diagnosis not present

## 2019-09-11 DIAGNOSIS — J1282 Pneumonia due to coronavirus disease 2019: Secondary | ICD-10-CM | POA: Diagnosis not present

## 2019-09-11 DIAGNOSIS — B961 Klebsiella pneumoniae [K. pneumoniae] as the cause of diseases classified elsewhere: Secondary | ICD-10-CM | POA: Diagnosis not present

## 2019-09-14 DIAGNOSIS — J1282 Pneumonia due to coronavirus disease 2019: Secondary | ICD-10-CM | POA: Diagnosis not present

## 2019-09-14 DIAGNOSIS — J9601 Acute respiratory failure with hypoxia: Secondary | ICD-10-CM | POA: Diagnosis not present

## 2019-09-14 DIAGNOSIS — N390 Urinary tract infection, site not specified: Secondary | ICD-10-CM | POA: Diagnosis not present

## 2019-09-14 DIAGNOSIS — U071 COVID-19: Secondary | ICD-10-CM | POA: Diagnosis not present

## 2019-09-14 DIAGNOSIS — J440 Chronic obstructive pulmonary disease with acute lower respiratory infection: Secondary | ICD-10-CM | POA: Diagnosis not present

## 2019-09-14 DIAGNOSIS — I10 Essential (primary) hypertension: Secondary | ICD-10-CM | POA: Diagnosis not present

## 2019-09-14 DIAGNOSIS — E119 Type 2 diabetes mellitus without complications: Secondary | ICD-10-CM | POA: Diagnosis not present

## 2019-09-14 DIAGNOSIS — B961 Klebsiella pneumoniae [K. pneumoniae] as the cause of diseases classified elsewhere: Secondary | ICD-10-CM | POA: Diagnosis not present

## 2019-09-14 DIAGNOSIS — I2510 Atherosclerotic heart disease of native coronary artery without angina pectoris: Secondary | ICD-10-CM | POA: Diagnosis not present

## 2019-09-16 DIAGNOSIS — J440 Chronic obstructive pulmonary disease with acute lower respiratory infection: Secondary | ICD-10-CM | POA: Diagnosis not present

## 2019-09-16 DIAGNOSIS — B961 Klebsiella pneumoniae [K. pneumoniae] as the cause of diseases classified elsewhere: Secondary | ICD-10-CM | POA: Diagnosis not present

## 2019-09-16 DIAGNOSIS — U071 COVID-19: Secondary | ICD-10-CM | POA: Diagnosis not present

## 2019-09-16 DIAGNOSIS — I2510 Atherosclerotic heart disease of native coronary artery without angina pectoris: Secondary | ICD-10-CM | POA: Diagnosis not present

## 2019-09-16 DIAGNOSIS — J9601 Acute respiratory failure with hypoxia: Secondary | ICD-10-CM | POA: Diagnosis not present

## 2019-09-16 DIAGNOSIS — N390 Urinary tract infection, site not specified: Secondary | ICD-10-CM | POA: Diagnosis not present

## 2019-09-16 DIAGNOSIS — E119 Type 2 diabetes mellitus without complications: Secondary | ICD-10-CM | POA: Diagnosis not present

## 2019-09-16 DIAGNOSIS — J1282 Pneumonia due to coronavirus disease 2019: Secondary | ICD-10-CM | POA: Diagnosis not present

## 2019-09-16 DIAGNOSIS — I10 Essential (primary) hypertension: Secondary | ICD-10-CM | POA: Diagnosis not present

## 2019-09-17 DIAGNOSIS — U071 COVID-19: Secondary | ICD-10-CM | POA: Diagnosis not present

## 2019-09-17 DIAGNOSIS — J1282 Pneumonia due to coronavirus disease 2019: Secondary | ICD-10-CM | POA: Diagnosis not present

## 2019-09-17 DIAGNOSIS — E119 Type 2 diabetes mellitus without complications: Secondary | ICD-10-CM | POA: Diagnosis not present

## 2019-09-17 DIAGNOSIS — I10 Essential (primary) hypertension: Secondary | ICD-10-CM | POA: Diagnosis not present

## 2019-09-17 DIAGNOSIS — J440 Chronic obstructive pulmonary disease with acute lower respiratory infection: Secondary | ICD-10-CM | POA: Diagnosis not present

## 2019-09-17 DIAGNOSIS — J9601 Acute respiratory failure with hypoxia: Secondary | ICD-10-CM | POA: Diagnosis not present

## 2019-09-17 DIAGNOSIS — B961 Klebsiella pneumoniae [K. pneumoniae] as the cause of diseases classified elsewhere: Secondary | ICD-10-CM | POA: Diagnosis not present

## 2019-09-17 DIAGNOSIS — N390 Urinary tract infection, site not specified: Secondary | ICD-10-CM | POA: Diagnosis not present

## 2019-09-17 DIAGNOSIS — I2510 Atherosclerotic heart disease of native coronary artery without angina pectoris: Secondary | ICD-10-CM | POA: Diagnosis not present

## 2019-09-22 DIAGNOSIS — I10 Essential (primary) hypertension: Secondary | ICD-10-CM | POA: Diagnosis not present

## 2019-09-22 DIAGNOSIS — B961 Klebsiella pneumoniae [K. pneumoniae] as the cause of diseases classified elsewhere: Secondary | ICD-10-CM | POA: Diagnosis not present

## 2019-09-22 DIAGNOSIS — N390 Urinary tract infection, site not specified: Secondary | ICD-10-CM | POA: Diagnosis not present

## 2019-09-22 DIAGNOSIS — J1282 Pneumonia due to coronavirus disease 2019: Secondary | ICD-10-CM | POA: Diagnosis not present

## 2019-09-22 DIAGNOSIS — J9601 Acute respiratory failure with hypoxia: Secondary | ICD-10-CM | POA: Diagnosis not present

## 2019-09-22 DIAGNOSIS — I2510 Atherosclerotic heart disease of native coronary artery without angina pectoris: Secondary | ICD-10-CM | POA: Diagnosis not present

## 2019-09-22 DIAGNOSIS — U071 COVID-19: Secondary | ICD-10-CM | POA: Diagnosis not present

## 2019-09-22 DIAGNOSIS — E119 Type 2 diabetes mellitus without complications: Secondary | ICD-10-CM | POA: Diagnosis not present

## 2019-09-22 DIAGNOSIS — J440 Chronic obstructive pulmonary disease with acute lower respiratory infection: Secondary | ICD-10-CM | POA: Diagnosis not present

## 2019-09-25 DIAGNOSIS — J1282 Pneumonia due to coronavirus disease 2019: Secondary | ICD-10-CM | POA: Diagnosis not present

## 2019-09-25 DIAGNOSIS — B961 Klebsiella pneumoniae [K. pneumoniae] as the cause of diseases classified elsewhere: Secondary | ICD-10-CM | POA: Diagnosis not present

## 2019-09-25 DIAGNOSIS — J9601 Acute respiratory failure with hypoxia: Secondary | ICD-10-CM | POA: Diagnosis not present

## 2019-09-25 DIAGNOSIS — J440 Chronic obstructive pulmonary disease with acute lower respiratory infection: Secondary | ICD-10-CM | POA: Diagnosis not present

## 2019-09-25 DIAGNOSIS — N390 Urinary tract infection, site not specified: Secondary | ICD-10-CM | POA: Diagnosis not present

## 2019-09-25 DIAGNOSIS — I2510 Atherosclerotic heart disease of native coronary artery without angina pectoris: Secondary | ICD-10-CM | POA: Diagnosis not present

## 2019-09-25 DIAGNOSIS — U071 COVID-19: Secondary | ICD-10-CM | POA: Diagnosis not present

## 2019-09-25 DIAGNOSIS — E119 Type 2 diabetes mellitus without complications: Secondary | ICD-10-CM | POA: Diagnosis not present

## 2019-09-25 DIAGNOSIS — I10 Essential (primary) hypertension: Secondary | ICD-10-CM | POA: Diagnosis not present

## 2019-09-30 DIAGNOSIS — I10 Essential (primary) hypertension: Secondary | ICD-10-CM | POA: Diagnosis not present

## 2019-09-30 DIAGNOSIS — E119 Type 2 diabetes mellitus without complications: Secondary | ICD-10-CM | POA: Diagnosis not present

## 2019-09-30 DIAGNOSIS — N390 Urinary tract infection, site not specified: Secondary | ICD-10-CM | POA: Diagnosis not present

## 2019-09-30 DIAGNOSIS — B961 Klebsiella pneumoniae [K. pneumoniae] as the cause of diseases classified elsewhere: Secondary | ICD-10-CM | POA: Diagnosis not present

## 2019-09-30 DIAGNOSIS — I2510 Atherosclerotic heart disease of native coronary artery without angina pectoris: Secondary | ICD-10-CM | POA: Diagnosis not present

## 2019-09-30 DIAGNOSIS — J1282 Pneumonia due to coronavirus disease 2019: Secondary | ICD-10-CM | POA: Diagnosis not present

## 2019-09-30 DIAGNOSIS — U071 COVID-19: Secondary | ICD-10-CM | POA: Diagnosis not present

## 2019-09-30 DIAGNOSIS — J440 Chronic obstructive pulmonary disease with acute lower respiratory infection: Secondary | ICD-10-CM | POA: Diagnosis not present

## 2019-09-30 DIAGNOSIS — J9601 Acute respiratory failure with hypoxia: Secondary | ICD-10-CM | POA: Diagnosis not present

## 2019-10-02 DIAGNOSIS — I10 Essential (primary) hypertension: Secondary | ICD-10-CM | POA: Diagnosis not present

## 2019-10-02 DIAGNOSIS — J440 Chronic obstructive pulmonary disease with acute lower respiratory infection: Secondary | ICD-10-CM | POA: Diagnosis not present

## 2019-10-02 DIAGNOSIS — U071 COVID-19: Secondary | ICD-10-CM | POA: Diagnosis not present

## 2019-10-02 DIAGNOSIS — B961 Klebsiella pneumoniae [K. pneumoniae] as the cause of diseases classified elsewhere: Secondary | ICD-10-CM | POA: Diagnosis not present

## 2019-10-02 DIAGNOSIS — N390 Urinary tract infection, site not specified: Secondary | ICD-10-CM | POA: Diagnosis not present

## 2019-10-02 DIAGNOSIS — J1282 Pneumonia due to coronavirus disease 2019: Secondary | ICD-10-CM | POA: Diagnosis not present

## 2019-10-02 DIAGNOSIS — E119 Type 2 diabetes mellitus without complications: Secondary | ICD-10-CM | POA: Diagnosis not present

## 2019-10-02 DIAGNOSIS — J9601 Acute respiratory failure with hypoxia: Secondary | ICD-10-CM | POA: Diagnosis not present

## 2019-10-02 DIAGNOSIS — I2510 Atherosclerotic heart disease of native coronary artery without angina pectoris: Secondary | ICD-10-CM | POA: Diagnosis not present

## 2019-10-05 DIAGNOSIS — E119 Type 2 diabetes mellitus without complications: Secondary | ICD-10-CM | POA: Diagnosis not present

## 2019-10-05 DIAGNOSIS — J1282 Pneumonia due to coronavirus disease 2019: Secondary | ICD-10-CM | POA: Diagnosis not present

## 2019-10-05 DIAGNOSIS — N390 Urinary tract infection, site not specified: Secondary | ICD-10-CM | POA: Diagnosis not present

## 2019-10-05 DIAGNOSIS — J9601 Acute respiratory failure with hypoxia: Secondary | ICD-10-CM | POA: Diagnosis not present

## 2019-10-05 DIAGNOSIS — U071 COVID-19: Secondary | ICD-10-CM | POA: Diagnosis not present

## 2019-10-05 DIAGNOSIS — I2510 Atherosclerotic heart disease of native coronary artery without angina pectoris: Secondary | ICD-10-CM | POA: Diagnosis not present

## 2019-10-05 DIAGNOSIS — J440 Chronic obstructive pulmonary disease with acute lower respiratory infection: Secondary | ICD-10-CM | POA: Diagnosis not present

## 2019-10-05 DIAGNOSIS — B961 Klebsiella pneumoniae [K. pneumoniae] as the cause of diseases classified elsewhere: Secondary | ICD-10-CM | POA: Diagnosis not present

## 2019-10-05 DIAGNOSIS — I10 Essential (primary) hypertension: Secondary | ICD-10-CM | POA: Diagnosis not present

## 2019-10-07 DIAGNOSIS — I10 Essential (primary) hypertension: Secondary | ICD-10-CM | POA: Diagnosis not present

## 2019-10-07 DIAGNOSIS — J440 Chronic obstructive pulmonary disease with acute lower respiratory infection: Secondary | ICD-10-CM | POA: Diagnosis not present

## 2019-10-07 DIAGNOSIS — N390 Urinary tract infection, site not specified: Secondary | ICD-10-CM | POA: Diagnosis not present

## 2019-10-07 DIAGNOSIS — B961 Klebsiella pneumoniae [K. pneumoniae] as the cause of diseases classified elsewhere: Secondary | ICD-10-CM | POA: Diagnosis not present

## 2019-10-07 DIAGNOSIS — E119 Type 2 diabetes mellitus without complications: Secondary | ICD-10-CM | POA: Diagnosis not present

## 2019-10-07 DIAGNOSIS — U071 COVID-19: Secondary | ICD-10-CM | POA: Diagnosis not present

## 2019-10-07 DIAGNOSIS — J9601 Acute respiratory failure with hypoxia: Secondary | ICD-10-CM | POA: Diagnosis not present

## 2019-10-07 DIAGNOSIS — I2510 Atherosclerotic heart disease of native coronary artery without angina pectoris: Secondary | ICD-10-CM | POA: Diagnosis not present

## 2019-10-07 DIAGNOSIS — J1282 Pneumonia due to coronavirus disease 2019: Secondary | ICD-10-CM | POA: Diagnosis not present

## 2019-10-12 DIAGNOSIS — U071 COVID-19: Secondary | ICD-10-CM | POA: Diagnosis not present

## 2019-10-28 DIAGNOSIS — U071 COVID-19: Secondary | ICD-10-CM | POA: Diagnosis not present

## 2019-10-28 DIAGNOSIS — Z8616 Personal history of COVID-19: Secondary | ICD-10-CM | POA: Diagnosis not present

## 2019-10-28 DIAGNOSIS — R05 Cough: Secondary | ICD-10-CM | POA: Diagnosis not present

## 2019-10-28 DIAGNOSIS — R197 Diarrhea, unspecified: Secondary | ICD-10-CM | POA: Diagnosis not present

## 2019-10-28 DIAGNOSIS — J1282 Pneumonia due to coronavirus disease 2019: Secondary | ICD-10-CM | POA: Diagnosis not present

## 2020-01-29 DIAGNOSIS — I10 Essential (primary) hypertension: Secondary | ICD-10-CM | POA: Diagnosis not present

## 2020-01-29 DIAGNOSIS — Z23 Encounter for immunization: Secondary | ICD-10-CM | POA: Diagnosis not present

## 2020-01-29 DIAGNOSIS — D649 Anemia, unspecified: Secondary | ICD-10-CM | POA: Diagnosis not present

## 2020-01-29 DIAGNOSIS — J129 Viral pneumonia, unspecified: Secondary | ICD-10-CM | POA: Diagnosis not present

## 2020-01-29 DIAGNOSIS — R059 Cough, unspecified: Secondary | ICD-10-CM | POA: Diagnosis not present

## 2020-02-16 DIAGNOSIS — J479 Bronchiectasis, uncomplicated: Secondary | ICD-10-CM | POA: Diagnosis not present

## 2020-02-16 DIAGNOSIS — J8410 Pulmonary fibrosis, unspecified: Secondary | ICD-10-CM | POA: Diagnosis not present

## 2020-02-16 DIAGNOSIS — J129 Viral pneumonia, unspecified: Secondary | ICD-10-CM | POA: Diagnosis not present

## 2020-02-16 DIAGNOSIS — I2510 Atherosclerotic heart disease of native coronary artery without angina pectoris: Secondary | ICD-10-CM | POA: Diagnosis not present

## 2020-02-16 DIAGNOSIS — Z8701 Personal history of pneumonia (recurrent): Secondary | ICD-10-CM | POA: Diagnosis not present

## 2020-03-22 DIAGNOSIS — R531 Weakness: Secondary | ICD-10-CM | POA: Diagnosis not present

## 2020-03-22 DIAGNOSIS — R000 Tachycardia, unspecified: Secondary | ICD-10-CM | POA: Diagnosis not present

## 2020-03-22 DIAGNOSIS — A419 Sepsis, unspecified organism: Secondary | ICD-10-CM | POA: Diagnosis not present

## 2020-03-22 DIAGNOSIS — N200 Calculus of kidney: Secondary | ICD-10-CM | POA: Diagnosis not present

## 2020-03-22 DIAGNOSIS — I482 Chronic atrial fibrillation, unspecified: Secondary | ICD-10-CM | POA: Diagnosis not present

## 2020-03-22 DIAGNOSIS — R4182 Altered mental status, unspecified: Secondary | ICD-10-CM | POA: Diagnosis not present

## 2020-03-22 DIAGNOSIS — I130 Hypertensive heart and chronic kidney disease with heart failure and stage 1 through stage 4 chronic kidney disease, or unspecified chronic kidney disease: Secondary | ICD-10-CM | POA: Diagnosis not present

## 2020-03-22 DIAGNOSIS — R0689 Other abnormalities of breathing: Secondary | ICD-10-CM | POA: Diagnosis not present

## 2020-03-22 DIAGNOSIS — F0390 Unspecified dementia without behavioral disturbance: Secondary | ICD-10-CM | POA: Diagnosis not present

## 2020-03-22 DIAGNOSIS — R0902 Hypoxemia: Secondary | ICD-10-CM | POA: Diagnosis not present

## 2020-03-22 DIAGNOSIS — R0602 Shortness of breath: Secondary | ICD-10-CM | POA: Diagnosis not present

## 2020-03-22 DIAGNOSIS — J440 Chronic obstructive pulmonary disease with acute lower respiratory infection: Secondary | ICD-10-CM | POA: Diagnosis not present

## 2020-03-22 DIAGNOSIS — R059 Cough, unspecified: Secondary | ICD-10-CM | POA: Diagnosis not present

## 2020-03-22 DIAGNOSIS — J189 Pneumonia, unspecified organism: Secondary | ICD-10-CM | POA: Diagnosis not present

## 2020-03-22 DIAGNOSIS — E039 Hypothyroidism, unspecified: Secondary | ICD-10-CM | POA: Diagnosis not present

## 2020-03-22 DIAGNOSIS — I959 Hypotension, unspecified: Secondary | ICD-10-CM | POA: Diagnosis not present

## 2020-03-22 DIAGNOSIS — K219 Gastro-esophageal reflux disease without esophagitis: Secondary | ICD-10-CM | POA: Diagnosis not present

## 2020-03-22 DIAGNOSIS — J8410 Pulmonary fibrosis, unspecified: Secondary | ICD-10-CM | POA: Diagnosis not present

## 2020-03-23 DIAGNOSIS — J189 Pneumonia, unspecified organism: Secondary | ICD-10-CM | POA: Diagnosis not present

## 2020-03-23 DIAGNOSIS — A419 Sepsis, unspecified organism: Secondary | ICD-10-CM | POA: Diagnosis not present

## 2020-03-23 DIAGNOSIS — J440 Chronic obstructive pulmonary disease with acute lower respiratory infection: Secondary | ICD-10-CM | POA: Diagnosis not present

## 2020-03-24 DIAGNOSIS — J440 Chronic obstructive pulmonary disease with acute lower respiratory infection: Secondary | ICD-10-CM | POA: Diagnosis not present

## 2020-03-24 DIAGNOSIS — J8410 Pulmonary fibrosis, unspecified: Secondary | ICD-10-CM | POA: Diagnosis not present

## 2020-03-24 DIAGNOSIS — A419 Sepsis, unspecified organism: Secondary | ICD-10-CM | POA: Diagnosis not present

## 2020-03-24 DIAGNOSIS — R0602 Shortness of breath: Secondary | ICD-10-CM | POA: Diagnosis not present

## 2020-03-24 DIAGNOSIS — J189 Pneumonia, unspecified organism: Secondary | ICD-10-CM

## 2020-03-25 DIAGNOSIS — J440 Chronic obstructive pulmonary disease with acute lower respiratory infection: Secondary | ICD-10-CM | POA: Diagnosis not present

## 2020-03-25 DIAGNOSIS — J189 Pneumonia, unspecified organism: Secondary | ICD-10-CM | POA: Diagnosis not present

## 2020-03-25 DIAGNOSIS — A419 Sepsis, unspecified organism: Secondary | ICD-10-CM | POA: Diagnosis not present

## 2020-03-26 DIAGNOSIS — J440 Chronic obstructive pulmonary disease with acute lower respiratory infection: Secondary | ICD-10-CM | POA: Diagnosis not present

## 2020-03-26 DIAGNOSIS — A419 Sepsis, unspecified organism: Secondary | ICD-10-CM | POA: Diagnosis not present

## 2020-03-26 DIAGNOSIS — J189 Pneumonia, unspecified organism: Secondary | ICD-10-CM | POA: Diagnosis not present

## 2020-03-28 DIAGNOSIS — I951 Orthostatic hypotension: Secondary | ICD-10-CM | POA: Diagnosis not present

## 2020-03-28 DIAGNOSIS — I509 Heart failure, unspecified: Secondary | ICD-10-CM | POA: Diagnosis not present

## 2020-03-28 DIAGNOSIS — I2510 Atherosclerotic heart disease of native coronary artery without angina pectoris: Secondary | ICD-10-CM | POA: Diagnosis not present

## 2020-03-28 DIAGNOSIS — A419 Sepsis, unspecified organism: Secondary | ICD-10-CM | POA: Diagnosis not present

## 2020-03-28 DIAGNOSIS — J8410 Pulmonary fibrosis, unspecified: Secondary | ICD-10-CM | POA: Diagnosis not present

## 2020-03-28 DIAGNOSIS — J440 Chronic obstructive pulmonary disease with acute lower respiratory infection: Secondary | ICD-10-CM | POA: Diagnosis not present

## 2020-03-28 DIAGNOSIS — I110 Hypertensive heart disease with heart failure: Secondary | ICD-10-CM | POA: Diagnosis not present

## 2020-03-28 DIAGNOSIS — E871 Hypo-osmolality and hyponatremia: Secondary | ICD-10-CM | POA: Diagnosis not present

## 2020-03-28 DIAGNOSIS — J189 Pneumonia, unspecified organism: Secondary | ICD-10-CM | POA: Diagnosis not present

## 2020-03-31 DIAGNOSIS — A419 Sepsis, unspecified organism: Secondary | ICD-10-CM | POA: Diagnosis not present

## 2020-03-31 DIAGNOSIS — J440 Chronic obstructive pulmonary disease with acute lower respiratory infection: Secondary | ICD-10-CM | POA: Diagnosis not present

## 2020-03-31 DIAGNOSIS — I509 Heart failure, unspecified: Secondary | ICD-10-CM | POA: Diagnosis not present

## 2020-03-31 DIAGNOSIS — E871 Hypo-osmolality and hyponatremia: Secondary | ICD-10-CM | POA: Diagnosis not present

## 2020-03-31 DIAGNOSIS — I110 Hypertensive heart disease with heart failure: Secondary | ICD-10-CM | POA: Diagnosis not present

## 2020-03-31 DIAGNOSIS — I2510 Atherosclerotic heart disease of native coronary artery without angina pectoris: Secondary | ICD-10-CM | POA: Diagnosis not present

## 2020-03-31 DIAGNOSIS — J189 Pneumonia, unspecified organism: Secondary | ICD-10-CM | POA: Diagnosis not present

## 2020-03-31 DIAGNOSIS — J8410 Pulmonary fibrosis, unspecified: Secondary | ICD-10-CM | POA: Diagnosis not present

## 2020-03-31 DIAGNOSIS — I951 Orthostatic hypotension: Secondary | ICD-10-CM | POA: Diagnosis not present

## 2020-04-01 DIAGNOSIS — I951 Orthostatic hypotension: Secondary | ICD-10-CM | POA: Diagnosis not present

## 2020-04-01 DIAGNOSIS — I509 Heart failure, unspecified: Secondary | ICD-10-CM | POA: Diagnosis not present

## 2020-04-01 DIAGNOSIS — I110 Hypertensive heart disease with heart failure: Secondary | ICD-10-CM | POA: Diagnosis not present

## 2020-04-01 DIAGNOSIS — A419 Sepsis, unspecified organism: Secondary | ICD-10-CM | POA: Diagnosis not present

## 2020-04-01 DIAGNOSIS — J440 Chronic obstructive pulmonary disease with acute lower respiratory infection: Secondary | ICD-10-CM | POA: Diagnosis not present

## 2020-04-01 DIAGNOSIS — J8410 Pulmonary fibrosis, unspecified: Secondary | ICD-10-CM | POA: Diagnosis not present

## 2020-04-01 DIAGNOSIS — E876 Hypokalemia: Secondary | ICD-10-CM | POA: Diagnosis not present

## 2020-04-01 DIAGNOSIS — M6281 Muscle weakness (generalized): Secondary | ICD-10-CM | POA: Diagnosis not present

## 2020-04-01 DIAGNOSIS — J189 Pneumonia, unspecified organism: Secondary | ICD-10-CM | POA: Diagnosis not present

## 2020-04-01 DIAGNOSIS — E871 Hypo-osmolality and hyponatremia: Secondary | ICD-10-CM | POA: Diagnosis not present

## 2020-04-01 DIAGNOSIS — I2510 Atherosclerotic heart disease of native coronary artery without angina pectoris: Secondary | ICD-10-CM | POA: Diagnosis not present

## 2020-04-05 ENCOUNTER — Ambulatory Visit: Payer: Medicare HMO | Admitting: Pulmonary Disease

## 2020-04-05 ENCOUNTER — Encounter: Payer: Self-pay | Admitting: Pulmonary Disease

## 2020-04-05 ENCOUNTER — Other Ambulatory Visit: Payer: Self-pay

## 2020-04-05 VITALS — BP 104/64 | HR 64 | Temp 97.6°F | Ht 62.0 in | Wt 139.2 lb

## 2020-04-05 DIAGNOSIS — J310 Chronic rhinitis: Secondary | ICD-10-CM

## 2020-04-05 DIAGNOSIS — J439 Emphysema, unspecified: Secondary | ICD-10-CM

## 2020-04-05 DIAGNOSIS — R059 Cough, unspecified: Secondary | ICD-10-CM | POA: Diagnosis not present

## 2020-04-05 MED ORDER — FLUTICASONE PROPIONATE 50 MCG/ACT NA SUSP: 1.0000 | Freq: Every day | NASAL | 6 refills | Status: DC

## 2020-04-05 MED ORDER — IPRATROPIUM BROMIDE 0.02 % IN SOLN: 0.5000 mg | Freq: Four times a day (QID) | RESPIRATORY_TRACT | 12 refills | Status: DC | PRN

## 2020-04-05 MED ORDER — IPRATROPIUM BROMIDE 0.03 % NA SOLN: 2.0000 | Freq: Two times a day (BID) | NASAL | 12 refills | Status: DC

## 2020-04-05 NOTE — Patient Instructions (Addendum)
Start flonase nasal spray, 1 spray per nostril daily  Start ipratropium nasal spray 1 spray twice daily for 1 week, then use as needed for sinus congestion  Start ipratropium nebulizer treatments twice daily, followed by flutter valve use.  Call in 3-4 weeks with an update  Follow up in 2 months

## 2020-04-05 NOTE — Progress Notes (Signed)
Synopsis: Referred by Crist Fat, MD for pulmonary fibrosis  Subjective:   PATIENT ID: Jenny Kelly, Jenny Kelly   HPI  Chief Complaint  Patient presents with  . consult    Covid pneumonia that has produced a cough. Has been having reoccurrences of pneumonia to later be found as fibrosis    Jenny Kelly is a 74 year old woman, former smoker who has been referred to pulmonary clinic for evaluation of pulmonary fibrosis.   She has history of upper lobe predominant emphysema based on chest imaging from 2020 who was admitted to [facility] 07/28/19 with Covid 19 pneumonia. She reports since her illness with covid she has had a terrible cough that is productive of sputum. She has been coughing so much that her chest hurts. She was also treated recently for bacterial pneumonia with augmentin.   She has been using albuterol nebulizer treatments but these make her cough and feel light headed and dizzy. She has a flutter valve at home but does not use it often. She is also taking mucinex for the congestion. She has chronic runny nose and sinus drainage. She has intermittent wheezing.    Past Medical History:  Diagnosis Date  . Allergy   . Anemia   . Anxiety   . Arthritis   . Asthma   . Atrial fibrillation (HCC)   . Blood transfusion without reported diagnosis   . Cataract   . CHF (congestive heart failure) (HCC)   . Chronic kidney disease   . Clotting disorder (HCC)   . COPD (chronic obstructive pulmonary disease) (HCC)   . Depression   . Diabetes mellitus without complication (HCC)   . GERD (gastroesophageal reflux disease)   . Heart murmur   . Hyperlipidemia   . Myocardial infarction (HCC)   . Neuromuscular disorder (HCC)    tremors  . Seizures (HCC)   . Sleep apnea   . Stroke (HCC)   . Thyroid disease      No family history on file.   Social History   Socioeconomic History  . Marital status: Married    Spouse name: Vonna Kotyk   . Number of children: 3  . Years of education: Not on file  . Highest education level: Not on file  Occupational History  . Not on file  Tobacco Use  . Smoking status: Former Smoker    Packs/day: 0.50    Years: 30.00    Pack years: 15.00    Types: Cigarettes    Quit date: 06/26/2000    Years since quitting: 19.7  . Smokeless tobacco: Never Used  Vaping Use  . Vaping Use: Former  Substance and Sexual Activity  . Alcohol use: Not Currently  . Drug use: Never  . Sexual activity: Not on file  Other Topics Concern  . Not on file  Social History Narrative  . Not on file   Social Determinants of Health   Financial Resource Strain: Not on file  Food Insecurity: Not on file  Transportation Needs: Not on file  Physical Activity: Not on file  Stress: Not on file  Social Connections: Not on file  Intimate Partner Violence: Not on file     Allergies  Allergen Reactions  . Ciprofloxacin Hives    Ask patient  . Prochlorperazine Edisylate Other (See Comments)    Cant swallow Muscle cramping  . Prednisone   . Reduced Iso-Alpha Acids Complex   . Statins Other (See Comments)  Muscle weakness   . Sulfa Antibiotics Swelling  . Azithromycin Rash  . Latex Rash     Outpatient Medications Prior to Visit  Medication Sig Dispense Refill  . acetaminophen (TYLENOL) 500 MG tablet Take 500 mg by mouth every 8 (eight) hours as needed.    Marland Kitchen albuterol (PROVENTIL HFA;VENTOLIN HFA) 108 (90 Base) MCG/ACT inhaler Inhale 2 puffs into the lungs every 6 (six) hours as needed for wheezing or shortness of breath.    Marland Kitchen amiodarone (PACERONE) 100 MG tablet Take 50 mg by mouth daily.    . calcium carbonate (TUMS - DOSED IN MG ELEMENTAL CALCIUM) 500 MG chewable tablet Chew 1 tablet by mouth as needed for indigestion or heartburn.    . donepezil (ARICEPT) 10 MG tablet Take 10 mg by mouth at bedtime.    . ergocalciferol (VITAMIN D2) 1.25 MG (50000 UT) capsule Take 50,000 Units by mouth See admin  instructions. Takes twice a week on Wednesday and Saturday    . fluconazole (DIFLUCAN) 150 MG tablet     . ipratropium-albuterol (DUONEB) 0.5-2.5 (3) MG/3ML SOLN Take 3 mLs by nebulization 2 (two) times daily after a meal.    . levothyroxine (SYNTHROID) 125 MCG tablet     . levothyroxine (SYNTHROID, LEVOTHROID) 100 MCG tablet Take 100 mcg by mouth daily before breakfast.    . lipase/protease/amylase (CREON) 12000 units CPEP capsule Take 24,000 Units by mouth 3 (three) times daily before meals.    . montelukast (SINGULAIR) 10 MG tablet Take 10 mg by mouth at bedtime.    Marland Kitchen nystatin (MYCOSTATIN/NYSTOP) powder Apply to affected areas as needed    . omeprazole (PRILOSEC) 40 MG capsule     . oxycodone (OXY-IR) 5 MG capsule Take 5 mg by mouth every 4 (four) hours as needed. Normally takes 1-2 times per day.    . pregabalin (LYRICA) 50 MG capsule Take 50 mg by mouth 2 (two) times daily.    . rivaroxaban (XARELTO) 20 MG TABS tablet Take 20 mg by mouth daily with supper.    . topiramate (TOPAMAX) 200 MG tablet Take 200 mg by mouth 2 (two) times daily.    Dorise Hiss 70 MG/ML SOAJ Inject into the skin. Inject into the skin every 30 days (Patient not taking: Reported on 04/05/2020)    . furosemide (LASIX) 10 MG/ML solution Take 20 mg by mouth as needed. (Patient not taking: Reported on 04/05/2020)    . ketorolac (TORADOL) 10 MG tablet Take 10 mg by mouth every 6 (six) hours as needed. (Patient not taking: Reported on 04/05/2020)    . pantoprazole (PROTONIX) 40 MG tablet Take 40 mg by mouth 2 (two) times daily.  (Patient not taking: Reported on 04/05/2020)     No facility-administered medications prior to visit.    Review of Systems  Constitutional: Negative for chills, fever, malaise/fatigue and weight loss.  HENT: Negative for congestion, sinus pain and sore throat.   Eyes: Negative.   Respiratory: Positive for cough, sputum production, shortness of breath and wheezing. Negative for hemoptysis.    Cardiovascular: Positive for chest pain. Negative for palpitations, orthopnea, claudication and leg swelling.  Gastrointestinal: Negative for abdominal pain, diarrhea, heartburn, nausea and vomiting.  Genitourinary: Negative.   Musculoskeletal: Negative.   Neurological: Negative for dizziness, weakness and headaches.  Psychiatric/Behavioral: Negative.     Objective:   Vitals:   04/05/20 1037  BP: 104/64  Pulse: 64  Temp: 97.6 F (36.4 C)  TempSrc: Oral  SpO2: 98%  Weight: 139  lb 3.2 oz (63.1 kg)  Height: 5\' 2"  (1.575 m)     Physical Exam Constitutional:      Comments: thin  HENT:     Head: Normocephalic and atraumatic.     Nose: Nose normal.     Mouth/Throat:     Mouth: Mucous membranes are moist.     Pharynx: Oropharynx is clear.  Eyes:     General: No scleral icterus.    Conjunctiva/sclera: Conjunctivae normal.     Pupils: Pupils are equal, round, and reactive to light.  Cardiovascular:     Rate and Rhythm: Normal rate and regular rhythm.     Pulses: Normal pulses.     Heart sounds: Normal heart sounds. No murmur heard.   Pulmonary:     Effort: Pulmonary effort is normal.     Breath sounds: Normal breath sounds. No wheezing, rhonchi or rales.  Abdominal:     Palpations: Abdomen is soft.  Musculoskeletal:     Right lower leg: No edema.     Left lower leg: No edema.  Skin:    General: Skin is warm and dry.     Capillary Refill: Capillary refill takes less than 2 seconds.  Neurological:     General: No focal deficit present.     Mental Status: She is alert.  Psychiatric:        Mood and Affect: Mood normal.        Behavior: Behavior normal.        Thought Content: Thought content normal.        Judgment: Judgment normal.     CBC No results found for: WBC, RBC, HGB, HCT, PLT, MCV, MCH, MCHC, RDW, LYMPHSABS, MONOABS, EOSABS, BASOSABS   Chest imaging: CXR 03/24/20 Background peripheral interstitial fibrotic pattern throughout both lungs, better  demonstrated by previous chest CT. Some improvement in the right upper lobe airspace opacity. Residual areas of airspace disease remained in both upper lobes as well as the lower lobes compatible with multilobar pneumonia. No enlarging effusion or pneumothorax. Stable heart size. Previous coronary bypass changes. Aorta atherosclerotic. Trachea midline. Bones are osteopenic. Degenerative changes noted spine.  CTA Chest 03/22/20 Mediastinum/Nodes: Chronic adenopathy with asymmetric right hilar and paratracheal nodal enlargement, some with dystrophic coarse calcifications, likely post infectious/reactive.  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 There is superimposed acute airspace disease in the right upper lobe and to a lesser extent at the bilateral lung bases. Tracheobronchomalacia. No visible effusion or pneumothorax. Motion artifact. There is a background of emphysema which was better seen in 2020  PFT: No flowsheet data found.    Assessment & Plan:   Pulmonary emphysema, unspecified emphysema type (HCC)  Cough  Rhinitis, unspecified type  Discussion: Jenny Kelly is a 74 year old woman, former smoker who has been referred to pulmonary clinic for evaluation of pulmonary fibrosis.   She has upper lobe predominant emphysema with fibrotic changes after her covid 19 pnuemonia. She has chronic cough with sputum production and chronic rhinitis.   She does not tolerate albuterol nebulizer treatments well and it does not appear that her insurance will cover saline nebulizer treatments. We will start her on ipratropium nebulizer treatments followed by flutter valve therapy for bronchial hygiene. She is to start flonase nasal spray and ipratropium nasal spray for the rhinitis and post nasal drip.   She does not have ambulatory desaturations in clinic today.   Follow up in 2 months  66, MD [facility] Office:  [phone]   See Amion for Pager Details    Current Outpatient Medications:  .  acetaminophen (TYLENOL) 500 MG tablet, Take 500 mg by mouth every 8 (eight) hours as needed., Disp: , Rfl:  .  albuterol (PROVENTIL HFA;VENTOLIN HFA) 108 (90 Base) MCG/ACT inhaler, Inhale 2 puffs into the lungs every 6 (six) hours as needed for wheezing or shortness of breath., Disp: , Rfl:  .  amiodarone (PACERONE) 100 MG tablet, Take 50 mg by mouth daily., Disp: , Rfl:  .  calcium carbonate (TUMS - DOSED IN MG ELEMENTAL CALCIUM) 500 MG chewable tablet, Chew 1 tablet by mouth as needed for indigestion or heartburn., Disp: , Rfl:  .  donepezil (ARICEPT) 10 MG tablet, Take 10 mg by mouth at bedtime., Disp: , Rfl:  .  ergocalciferol (VITAMIN D2) 1.25 MG (50000 UT) capsule, Take 50,000 Units by mouth See admin instructions. Takes twice a week on Wednesday and Saturday, Disp: , Rfl:  .  fluconazole (DIFLUCAN) 150 MG tablet, , Disp: , Rfl:  .  ipratropium-albuterol (DUONEB) 0.5-2.5 (3) MG/3ML SOLN, Take 3 mLs by nebulization 2 (two) times daily after a meal., Disp: , Rfl:  .  levothyroxine (SYNTHROID) 125 MCG tablet, , Disp: , Rfl:  .  levothyroxine (SYNTHROID, LEVOTHROID) 100 MCG tablet, Take 100 mcg by mouth daily before breakfast., Disp: , Rfl:  .  lipase/protease/amylase (CREON) 12000 units CPEP capsule, Take 24,000 Units by mouth 3 (three) times daily before meals., Disp: , Rfl:  .  montelukast (SINGULAIR) 10 MG tablet, Take 10 mg by mouth at bedtime., Disp: , Rfl:  .  nystatin (MYCOSTATIN/NYSTOP) powder, Apply to affected areas as needed, Disp: , Rfl:  .  omeprazole (PRILOSEC) 40 MG capsule, , Disp: , Rfl:  .  oxycodone (OXY-IR) 5 MG capsule, Take 5 mg by mouth every 4 (four) hours as needed. Normally takes 1-2 times per day., Disp: , Rfl:  .  pregabalin (LYRICA) 50 MG capsule, Take 50 mg by mouth 2 (two) times daily., Disp: , Rfl:  .  rivaroxaban (XARELTO) 20 MG TABS tablet, Take 20 mg by mouth daily with  supper., Disp: , Rfl:  .  topiramate (TOPAMAX) 200 MG tablet, Take 200 mg by mouth 2 (two) times daily., Disp: , Rfl:  .  Erenumab-aooe 70 MG/ML SOAJ, Inject into the skin. Inject into the skin every 30 days (Patient not taking: Reported on 04/05/2020), Disp: , Rfl:  .  fluticasone (FLONASE) 50 MCG/ACT nasal spray, Place 1 spray into both nostrils daily., Disp: 16 g, Rfl: 6 .  furosemide (LASIX) 10 MG/ML solution, Take 20 mg by mouth as needed. (Patient not taking: Reported on 04/05/2020), Disp: , Rfl:  .  ipratropium (ATROVENT) 0.02 % nebulizer solution, Take 2.5 mLs (0.5 mg total) by nebulization every 6 (six) hours as needed for wheezing or shortness of breath., Disp: 240 each, Rfl: 12 .  ipratropium (ATROVENT) 0.03 % nasal spray, Place 2 sprays into both nostrils every 12 (twelve) hours., Disp: 30 mL, Rfl: 12 .  ketorolac (TORADOL) 10 MG tablet, Take 10 mg by mouth every 6 (six) hours as needed. (Patient not taking: Reported on 04/05/2020), Disp: , Rfl:  .  pantoprazole (PROTONIX) 40 MG tablet, Take 40 mg by mouth 2 (two) times daily.  (Patient not taking: Reported on 04/05/2020), Disp: , Rfl:

## 2020-04-08 DIAGNOSIS — I509 Heart failure, unspecified: Secondary | ICD-10-CM | POA: Diagnosis not present

## 2020-04-08 DIAGNOSIS — A419 Sepsis, unspecified organism: Secondary | ICD-10-CM | POA: Diagnosis not present

## 2020-04-08 DIAGNOSIS — J189 Pneumonia, unspecified organism: Secondary | ICD-10-CM | POA: Diagnosis not present

## 2020-04-08 DIAGNOSIS — I951 Orthostatic hypotension: Secondary | ICD-10-CM | POA: Diagnosis not present

## 2020-04-08 DIAGNOSIS — J440 Chronic obstructive pulmonary disease with acute lower respiratory infection: Secondary | ICD-10-CM | POA: Diagnosis not present

## 2020-04-08 DIAGNOSIS — J8410 Pulmonary fibrosis, unspecified: Secondary | ICD-10-CM | POA: Diagnosis not present

## 2020-04-08 DIAGNOSIS — I2510 Atherosclerotic heart disease of native coronary artery without angina pectoris: Secondary | ICD-10-CM | POA: Diagnosis not present

## 2020-04-08 DIAGNOSIS — I110 Hypertensive heart disease with heart failure: Secondary | ICD-10-CM | POA: Diagnosis not present

## 2020-04-08 DIAGNOSIS — E871 Hypo-osmolality and hyponatremia: Secondary | ICD-10-CM | POA: Diagnosis not present

## 2020-04-14 DIAGNOSIS — A419 Sepsis, unspecified organism: Secondary | ICD-10-CM | POA: Diagnosis not present

## 2020-04-14 DIAGNOSIS — I110 Hypertensive heart disease with heart failure: Secondary | ICD-10-CM | POA: Diagnosis not present

## 2020-04-14 DIAGNOSIS — J8410 Pulmonary fibrosis, unspecified: Secondary | ICD-10-CM | POA: Diagnosis not present

## 2020-04-14 DIAGNOSIS — I951 Orthostatic hypotension: Secondary | ICD-10-CM | POA: Diagnosis not present

## 2020-04-14 DIAGNOSIS — E871 Hypo-osmolality and hyponatremia: Secondary | ICD-10-CM | POA: Diagnosis not present

## 2020-04-14 DIAGNOSIS — I509 Heart failure, unspecified: Secondary | ICD-10-CM | POA: Diagnosis not present

## 2020-04-14 DIAGNOSIS — J440 Chronic obstructive pulmonary disease with acute lower respiratory infection: Secondary | ICD-10-CM | POA: Diagnosis not present

## 2020-04-14 DIAGNOSIS — J189 Pneumonia, unspecified organism: Secondary | ICD-10-CM | POA: Diagnosis not present

## 2020-04-14 DIAGNOSIS — I2510 Atherosclerotic heart disease of native coronary artery without angina pectoris: Secondary | ICD-10-CM | POA: Diagnosis not present

## 2020-04-26 DIAGNOSIS — E871 Hypo-osmolality and hyponatremia: Secondary | ICD-10-CM | POA: Diagnosis not present

## 2020-04-26 DIAGNOSIS — J189 Pneumonia, unspecified organism: Secondary | ICD-10-CM | POA: Diagnosis not present

## 2020-04-26 DIAGNOSIS — I2510 Atherosclerotic heart disease of native coronary artery without angina pectoris: Secondary | ICD-10-CM | POA: Diagnosis not present

## 2020-04-26 DIAGNOSIS — I951 Orthostatic hypotension: Secondary | ICD-10-CM | POA: Diagnosis not present

## 2020-04-26 DIAGNOSIS — J440 Chronic obstructive pulmonary disease with acute lower respiratory infection: Secondary | ICD-10-CM | POA: Diagnosis not present

## 2020-04-26 DIAGNOSIS — I110 Hypertensive heart disease with heart failure: Secondary | ICD-10-CM | POA: Diagnosis not present

## 2020-04-26 DIAGNOSIS — I509 Heart failure, unspecified: Secondary | ICD-10-CM | POA: Diagnosis not present

## 2020-04-26 DIAGNOSIS — J8410 Pulmonary fibrosis, unspecified: Secondary | ICD-10-CM | POA: Diagnosis not present

## 2020-04-26 DIAGNOSIS — A419 Sepsis, unspecified organism: Secondary | ICD-10-CM | POA: Diagnosis not present

## 2020-05-04 DIAGNOSIS — J449 Chronic obstructive pulmonary disease, unspecified: Secondary | ICD-10-CM | POA: Diagnosis not present

## 2020-05-04 DIAGNOSIS — I10 Essential (primary) hypertension: Secondary | ICD-10-CM | POA: Diagnosis not present

## 2020-05-24 DIAGNOSIS — J8410 Pulmonary fibrosis, unspecified: Secondary | ICD-10-CM | POA: Diagnosis not present

## 2020-05-24 DIAGNOSIS — I509 Heart failure, unspecified: Secondary | ICD-10-CM | POA: Diagnosis not present

## 2020-05-24 DIAGNOSIS — I110 Hypertensive heart disease with heart failure: Secondary | ICD-10-CM | POA: Diagnosis not present

## 2020-05-24 DIAGNOSIS — I951 Orthostatic hypotension: Secondary | ICD-10-CM | POA: Diagnosis not present

## 2020-05-24 DIAGNOSIS — J189 Pneumonia, unspecified organism: Secondary | ICD-10-CM | POA: Diagnosis not present

## 2020-05-24 DIAGNOSIS — E871 Hypo-osmolality and hyponatremia: Secondary | ICD-10-CM | POA: Diagnosis not present

## 2020-05-24 DIAGNOSIS — I2510 Atherosclerotic heart disease of native coronary artery without angina pectoris: Secondary | ICD-10-CM | POA: Diagnosis not present

## 2020-05-24 DIAGNOSIS — J440 Chronic obstructive pulmonary disease with acute lower respiratory infection: Secondary | ICD-10-CM | POA: Diagnosis not present

## 2020-05-24 DIAGNOSIS — A419 Sepsis, unspecified organism: Secondary | ICD-10-CM | POA: Diagnosis not present

## 2020-06-14 ENCOUNTER — Ambulatory Visit: Payer: Medicare HMO | Admitting: Pulmonary Disease

## 2020-06-14 ENCOUNTER — Encounter: Payer: Self-pay | Admitting: Pulmonary Disease

## 2020-06-14 ENCOUNTER — Other Ambulatory Visit: Payer: Self-pay

## 2020-06-14 VITALS — BP 116/72 | HR 68 | Temp 97.6°F | Ht 62.0 in | Wt 139.6 lb

## 2020-06-14 DIAGNOSIS — J439 Emphysema, unspecified: Secondary | ICD-10-CM | POA: Diagnosis not present

## 2020-06-14 DIAGNOSIS — Z8616 Personal history of COVID-19: Secondary | ICD-10-CM | POA: Diagnosis not present

## 2020-06-14 DIAGNOSIS — J310 Chronic rhinitis: Secondary | ICD-10-CM | POA: Diagnosis not present

## 2020-06-14 MED ORDER — SPIRIVA RESPIMAT 2.5 MCG/ACT IN AERS: 2.0000 | INHALATION_SPRAY | Freq: Every day | RESPIRATORY_TRACT | 0 refills | Status: DC

## 2020-06-14 MED ORDER — SPIRIVA RESPIMAT 2.5 MCG/ACT IN AERS: 2.0000 | INHALATION_SPRAY | Freq: Every day | RESPIRATORY_TRACT | 6 refills | Status: DC

## 2020-06-14 NOTE — Progress Notes (Signed)
Synopsis: Referred by Crist Fat, MD for pulmonary fibrosis  Subjective:   PATIENT ID: Jenny Kelly GENDER: female DOB: Mar 04, 1946, MRN: [id number]   HPI  Chief Complaint  Patient presents with  . Follow-up    Her cough is improved    Aunesti Pellegrino is a 75 year old woman, former smoker with emphysema who returns to pulmonary clinic for follow up.   She has been doing well since last visit and has tolerated ipratropium nebulizer treatments well and is using them 2-3 times per day. She has intermittent wheezing. Her cough is improved since starting flonase and ipratropium nasal sprays at last visit. She still has productive cough more so in the morning times.    OV 04/05/20 She has history of upper lobe predominant emphysema based on chest imaging from 2020 who was admitted to [facility] 07/28/19 with Covid 19 pneumonia. She reports since her illness with covid she has had a terrible cough that is productive of sputum. She has been coughing so much that her chest hurts. She was also treated recently for bacterial pneumonia with augmentin.   She has been using albuterol nebulizer treatments but these make her cough and feel light headed and dizzy. She has a flutter valve at home but does not use it often. She is also taking mucinex for the congestion. She has chronic runny nose and sinus drainage. She has intermittent wheezing.    Past Medical History:  Diagnosis Date  . Allergy   . Anemia   . Anxiety   . Arthritis   . Asthma   . Atrial fibrillation (HCC)   . Blood transfusion without reported diagnosis   . Cataract   . CHF (congestive heart failure) (HCC)   . Chronic kidney disease   . Clotting disorder (HCC)   . COPD (chronic obstructive pulmonary disease) (HCC)   . Depression   . Diabetes mellitus without complication (HCC)   . GERD (gastroesophageal reflux disease)   . Heart murmur   . Hyperlipidemia   . Myocardial infarction (HCC)   . Neuromuscular disorder  (HCC)    tremors  . Seizures (HCC)   . Sleep apnea   . Stroke (HCC)   . Thyroid disease      History reviewed. No pertinent family history.   Social History   Socioeconomic History  . Marital status: Married    Spouse name: Vonna Kotyk  . Number of children: 3  . Years of education: Not on file  . Highest education level: Not on file  Occupational History  . Not on file  Tobacco Use  . Smoking status: Former Smoker    Packs/day: 0.50    Years: 30.00    Pack years: 15.00    Types: Cigarettes    Quit date: 06/26/2000    Years since quitting: 19.9  . Smokeless tobacco: Never Used  Vaping Use  . Vaping Use: Former  Substance and Sexual Activity  . Alcohol use: Not Currently  . Drug use: Never  . Sexual activity: Not on file  Other Topics Concern  . Not on file  Social History Narrative  . Not on file   Social Determinants of Health   Financial Resource Strain: Not on file  Food Insecurity: Not on file  Transportation Needs: Not on file  Physical Activity: Not on file  Stress: Not on file  Social Connections: Not on file  Intimate Partner Violence: Not on file     Allergies  Allergen Reactions  .  Ciprofloxacin Hives    Ask patient  . Prochlorperazine Edisylate Other (See Comments)    Cant swallow Muscle cramping  . Prednisone   . Reduced Iso-Alpha Acids Complex   . Statins Other (See Comments)    Muscle weakness   . Sulfa Antibiotics Swelling  . Azithromycin Rash  . Latex Rash     Outpatient Medications Prior to Visit  Medication Sig Dispense Refill  . acetaminophen (TYLENOL) 500 MG tablet Take 500 mg by mouth every 8 (eight) hours as needed.    Marland Kitchen albuterol (PROVENTIL HFA;VENTOLIN HFA) 108 (90 Base) MCG/ACT inhaler Inhale 2 puffs into the lungs every 6 (six) hours as needed for wheezing or shortness of breath.    Marland Kitchen amiodarone (PACERONE) 100 MG tablet Take 50 mg by mouth daily.    . calcium carbonate (TUMS - DOSED IN MG ELEMENTAL CALCIUM) 500 MG chewable  tablet Chew 1 tablet by mouth as needed for indigestion or heartburn.    . donepezil (ARICEPT) 10 MG tablet Take 10 mg by mouth at bedtime.    Dorise Hiss 70 MG/ML SOAJ Inject into the skin. Inject into the skin every 30 days    . ergocalciferol (VITAMIN D2) 1.25 MG (50000 UT) capsule Take 50,000 Units by mouth See admin instructions. Takes twice a week on Wednesday and Saturday    . fluconazole (DIFLUCAN) 150 MG tablet     . fluticasone (FLONASE) 50 MCG/ACT nasal spray Place 1 spray into both nostrils daily. 16 g 6  . furosemide (LASIX) 10 MG/ML solution Take 20 mg by mouth as needed.    Marland Kitchen ipratropium (ATROVENT) 0.02 % nebulizer solution Take 2.5 mLs (0.5 mg total) by nebulization every 6 (six) hours as needed for wheezing or shortness of breath. 240 each 12  . ipratropium (ATROVENT) 0.03 % nasal spray Place 2 sprays into both nostrils every 12 (twelve) hours. 30 mL 12  . ipratropium-albuterol (DUONEB) 0.5-2.5 (3) MG/3ML SOLN Take 3 mLs by nebulization 2 (two) times daily after a meal.    . ketorolac (TORADOL) 10 MG tablet Take 10 mg by mouth every 6 (six) hours as needed.    Marland Kitchen levothyroxine (SYNTHROID) 125 MCG tablet     . levothyroxine (SYNTHROID, LEVOTHROID) 100 MCG tablet Take 100 mcg by mouth daily before breakfast.    . lipase/protease/amylase (CREON) 12000 units CPEP capsule Take 24,000 Units by mouth 3 (three) times daily before meals.    . montelukast (SINGULAIR) 10 MG tablet Take 10 mg by mouth at bedtime.    Marland Kitchen nystatin (MYCOSTATIN/NYSTOP) powder Apply to affected areas as needed    . omeprazole (PRILOSEC) 40 MG capsule     . oxycodone (OXY-IR) 5 MG capsule Take 5 mg by mouth every 4 (four) hours as needed. Normally takes 1-2 times per day.    . pantoprazole (PROTONIX) 40 MG tablet Take 40 mg by mouth 2 (two) times daily.    . pregabalin (LYRICA) 50 MG capsule Take 50 mg by mouth 2 (two) times daily.    . rivaroxaban (XARELTO) 20 MG TABS tablet Take 20 mg by mouth daily with  supper.    . topiramate (TOPAMAX) 200 MG tablet Take 200 mg by mouth 2 (two) times daily.     No facility-administered medications prior to visit.    Review of Systems  Constitutional: Negative for chills, fever, malaise/fatigue and weight loss.  HENT: Negative for congestion, sinus pain and sore throat.   Eyes: Negative.   Respiratory: Positive for cough, sputum production,  shortness of breath and wheezing. Negative for hemoptysis.   Cardiovascular: Negative for chest pain, palpitations, orthopnea, claudication and leg swelling.  Gastrointestinal: Negative for abdominal pain, diarrhea, heartburn, nausea and vomiting.  Genitourinary: Negative.   Musculoskeletal: Negative.   Neurological: Negative for dizziness, weakness and headaches.  Psychiatric/Behavioral: Negative.     Objective:   Vitals:   06/14/20 1118  BP: 116/72  Pulse: 68  Temp: 97.6 F (36.4 C)  TempSrc: Temporal  SpO2: 95%  Weight: 139 lb 9.6 oz (63.3 kg)  Height: 5\' 2"  (1.575 m)     Physical Exam Constitutional:      Comments: thin  HENT:     Head: Normocephalic and atraumatic.     Nose: Nose normal.     Mouth/Throat:     Mouth: Mucous membranes are moist.     Pharynx: Oropharynx is clear.  Eyes:     General: No scleral icterus.    Conjunctiva/sclera: Conjunctivae normal.     Pupils: Pupils are equal, round, and reactive to light.  Cardiovascular:     Rate and Rhythm: Normal rate and regular rhythm.     Pulses: Normal pulses.     Heart sounds: Normal heart sounds. No murmur heard.   Pulmonary:     Effort: Pulmonary effort is normal.     Breath sounds: Normal breath sounds. No wheezing, rhonchi or rales.  Abdominal:     Palpations: Abdomen is soft.  Musculoskeletal:     Right lower leg: No edema.     Left lower leg: No edema.  Skin:    General: Skin is warm and dry.     Capillary Refill: Capillary refill takes less than 2 seconds.  Neurological:     General: No focal deficit present.      Mental Status: She is alert.  Psychiatric:        Mood and Affect: Mood normal.        Behavior: Behavior normal.        Thought Content: Thought content normal.        Judgment: Judgment normal.     CBC No results found for: WBC, RBC, HGB, HCT, PLT, MCV, MCH, MCHC, RDW, LYMPHSABS, MONOABS, EOSABS, BASOSABS   Chest imaging: CXR 03/24/20 Background peripheral interstitial fibrotic pattern throughout both lungs, better demonstrated by previous chest CT. Some improvement in the right upper lobe airspace opacity. Residual areas of airspace disease remained in both upper lobes as well as the lower lobes compatible with multilobar pneumonia. No enlarging effusion or pneumothorax. Stable heart size. Previous coronary bypass changes. Aorta atherosclerotic. Trachea midline. Bones are osteopenic. Degenerative changes noted spine.  CTA Chest 03/22/20 Mediastinum/Nodes: Chronic adenopathy with asymmetric right hilar and paratracheal nodal enlargement, some with dystrophic coarse calcifications, likely post infectious/reactive.  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 There is superimposed acute airspace disease in the right upper lobe and to a lesser extent at the bilateral lung bases. Tracheobronchomalacia. No visible effusion or pneumothorax. Motion artifact. There is a background of emphysema which was better seen in 2020  PFT: No flowsheet data found.    Assessment & Plan:   Pulmonary emphysema, unspecified emphysema type (HCC) - Plan: CT Chest Wo Contrast, Tiotropium Bromide Monohydrate (SPIRIVA RESPIMAT) 2.5 MCG/ACT AERS, Pulmonary Function Test  Rhinitis, unspecified type  History of COVID-19 - Plan: CT Chest Wo Contrast  Discussion: Jenny Kelly is a 75 year old woman, former smoker with emphysema who returns to pulmonary clinic for follow up.  She has upper lobe predominant emphysema with fibrotic changes after her covid 19  pnuemonia. We will repeat a CT Chest scan to monitor for resolution of her pneumonia which she has [facility].  She has chronic cough with sputum production and chronic rhinitis.   She does not tolerate albuterol nebulizer treatments well due to side effects but has tolerated ipratropium nebulizer treatments well.   We will start her on spiriva respimat 2.415mcg 2 puffs daily.    She is to continue flonase nasal spray and ipratropium nasal spray for the rhinitis and post nasal drip.   Follow up in 2 months  Melody ComasJonathan Gershom Brobeck, MD [facility] Office: [phone]   See Amion for Pager Details    Current Outpatient Medications:  .  acetaminophen (TYLENOL) 500 MG tablet, Take 500 mg by mouth every 8 (eight) hours as needed., Disp: , Rfl:  .  albuterol (PROVENTIL HFA;VENTOLIN HFA) 108 (90 Base) MCG/ACT inhaler, Inhale 2 puffs into the lungs every 6 (six) hours as needed for wheezing or shortness of breath., Disp: , Rfl:  .  amiodarone (PACERONE) 100 MG tablet, Take 50 mg by mouth daily., Disp: , Rfl:  .  calcium carbonate (TUMS - DOSED IN MG ELEMENTAL CALCIUM) 500 MG chewable tablet, Chew 1 tablet by mouth as needed for indigestion or heartburn., Disp: , Rfl:  .  donepezil (ARICEPT) 10 MG tablet, Take 10 mg by mouth at bedtime., Disp: , Rfl:  .  Erenumab-aooe 70 MG/ML SOAJ, Inject into the skin. Inject into the skin every 30 days, Disp: , Rfl:  .  ergocalciferol (VITAMIN D2) 1.25 MG (50000 UT) capsule, Take 50,000 Units by mouth See admin instructions. Takes twice a week on Wednesday and Saturday, Disp: , Rfl:  .  fluconazole (DIFLUCAN) 150 MG tablet, , Disp: , Rfl:  .  fluticasone (FLONASE) 50 MCG/ACT nasal spray, Place 1 spray into both nostrils daily., Disp: 16 g, Rfl: 6 .  furosemide (LASIX) 10 MG/ML solution, Take 20 mg by mouth as needed., Disp: , Rfl:  .  ipratropium (ATROVENT) 0.02 % nebulizer solution, Take 2.5 mLs (0.5 mg total)  by nebulization every 6 (six) hours as needed for wheezing or shortness of breath., Disp: 240 each, Rfl: 12 .  ipratropium (ATROVENT) 0.03 % nasal spray, Place 2 sprays into both nostrils every 12 (twelve) hours., Disp: 30 mL, Rfl: 12 .  ipratropium-albuterol (DUONEB) 0.5-2.5 (3) MG/3ML SOLN, Take 3 mLs by nebulization 2 (two) times daily after a meal., Disp: , Rfl:  .  ketorolac (TORADOL) 10 MG tablet, Take 10 mg by mouth every 6 (six) hours as needed., Disp: , Rfl:  .  levothyroxine (SYNTHROID) 125 MCG tablet, , Disp: , Rfl:  .  levothyroxine (SYNTHROID, LEVOTHROID) 100 MCG tablet, Take 100 mcg by mouth daily before breakfast., Disp: , Rfl:  .  lipase/protease/amylase (CREON) 12000 units CPEP capsule, Take 24,000 Units by mouth 3 (three) times daily before meals., Disp: , Rfl:  .  montelukast (SINGULAIR) 10 MG tablet, Take 10 mg by mouth at bedtime., Disp: , Rfl:  .  nystatin (MYCOSTATIN/NYSTOP) powder, Apply to affected areas as needed, Disp: , Rfl:  .  omeprazole (PRILOSEC) 40 MG capsule, , Disp: , Rfl:  .  oxycodone (OXY-IR) 5 MG capsule, Take 5 mg by mouth every 4 (four) hours as needed. Normally takes 1-2 times per day., Disp: , Rfl:  .  pantoprazole (PROTONIX) 40 MG tablet, Take 40 mg by  mouth 2 (two) times daily., Disp: , Rfl:  .  pregabalin (LYRICA) 50 MG capsule, Take 50 mg by mouth 2 (two) times daily., Disp: , Rfl:  .  rivaroxaban (XARELTO) 20 MG TABS tablet, Take 20 mg by mouth daily with supper., Disp: , Rfl:  .  Tiotropium Bromide Monohydrate (SPIRIVA RESPIMAT) 2.5 MCG/ACT AERS, Inhale 2 puffs into the lungs daily., Disp: 4 g, Rfl: 6 .  Tiotropium Bromide Monohydrate (SPIRIVA RESPIMAT) 2.5 MCG/ACT AERS, Inhale 2 puffs into the lungs daily., Disp: 4 g, Rfl: 0 .  topiramate (TOPAMAX) 200 MG tablet, Take 200 mg by mouth 2 (two) times daily., Disp: , Rfl:

## 2020-06-14 NOTE — Patient Instructions (Signed)
Start Spiriva respimat 2.83mcg 2 puffs, once daily  Continue flonase and ipratropium nasal spray.   We will schedule you for a CT Chest scan at [facility]  Follow up in 3 months with pulmonary function tests at that time

## 2020-06-20 DIAGNOSIS — R0602 Shortness of breath: Secondary | ICD-10-CM | POA: Diagnosis not present

## 2020-06-20 DIAGNOSIS — J439 Emphysema, unspecified: Secondary | ICD-10-CM | POA: Diagnosis not present

## 2020-07-21 DIAGNOSIS — E785 Hyperlipidemia, unspecified: Secondary | ICD-10-CM | POA: Diagnosis not present

## 2020-07-21 DIAGNOSIS — E119 Type 2 diabetes mellitus without complications: Secondary | ICD-10-CM | POA: Diagnosis not present

## 2020-08-02 ENCOUNTER — Other Ambulatory Visit: Payer: Self-pay | Admitting: Pulmonary Disease

## 2020-08-02 DIAGNOSIS — I25810 Atherosclerosis of coronary artery bypass graft(s) without angina pectoris: Secondary | ICD-10-CM | POA: Diagnosis not present

## 2020-08-02 DIAGNOSIS — I10 Essential (primary) hypertension: Secondary | ICD-10-CM | POA: Diagnosis not present

## 2020-08-04 DIAGNOSIS — I700 Atherosclerosis of aorta: Secondary | ICD-10-CM | POA: Diagnosis not present

## 2020-08-04 DIAGNOSIS — U099 Post covid-19 condition, unspecified: Secondary | ICD-10-CM | POA: Diagnosis not present

## 2020-08-04 DIAGNOSIS — E7800 Pure hypercholesterolemia, unspecified: Secondary | ICD-10-CM | POA: Diagnosis not present

## 2020-08-04 DIAGNOSIS — I4891 Unspecified atrial fibrillation: Secondary | ICD-10-CM | POA: Diagnosis not present

## 2020-08-04 DIAGNOSIS — Z0000 Encounter for general adult medical examination without abnormal findings: Secondary | ICD-10-CM | POA: Diagnosis not present

## 2020-08-04 DIAGNOSIS — R413 Other amnesia: Secondary | ICD-10-CM | POA: Diagnosis not present

## 2020-08-04 DIAGNOSIS — E1121 Type 2 diabetes mellitus with diabetic nephropathy: Secondary | ICD-10-CM | POA: Diagnosis not present

## 2020-08-04 DIAGNOSIS — Z6825 Body mass index (BMI) 25.0-25.9, adult: Secondary | ICD-10-CM | POA: Diagnosis not present

## 2020-08-04 DIAGNOSIS — I130 Hypertensive heart and chronic kidney disease with heart failure and stage 1 through stage 4 chronic kidney disease, or unspecified chronic kidney disease: Secondary | ICD-10-CM | POA: Diagnosis not present

## 2020-08-04 DIAGNOSIS — I10 Essential (primary) hypertension: Secondary | ICD-10-CM | POA: Diagnosis not present

## 2020-08-04 DIAGNOSIS — I872 Venous insufficiency (chronic) (peripheral): Secondary | ICD-10-CM | POA: Diagnosis not present

## 2020-08-04 DIAGNOSIS — J9610 Chronic respiratory failure, unspecified whether with hypoxia or hypercapnia: Secondary | ICD-10-CM | POA: Diagnosis not present

## 2020-08-04 DIAGNOSIS — E039 Hypothyroidism, unspecified: Secondary | ICD-10-CM | POA: Diagnosis not present

## 2020-08-04 DIAGNOSIS — Z1331 Encounter for screening for depression: Secondary | ICD-10-CM | POA: Diagnosis not present

## 2020-08-04 DIAGNOSIS — I5032 Chronic diastolic (congestive) heart failure: Secondary | ICD-10-CM | POA: Diagnosis not present

## 2020-08-04 DIAGNOSIS — Z1339 Encounter for screening examination for other mental health and behavioral disorders: Secondary | ICD-10-CM | POA: Diagnosis not present

## 2020-08-04 DIAGNOSIS — I25810 Atherosclerosis of coronary artery bypass graft(s) without angina pectoris: Secondary | ICD-10-CM | POA: Diagnosis not present

## 2020-09-09 DIAGNOSIS — Z1159 Encounter for screening for other viral diseases: Secondary | ICD-10-CM | POA: Diagnosis not present

## 2020-09-12 ENCOUNTER — Ambulatory Visit (INDEPENDENT_AMBULATORY_CARE_PROVIDER_SITE_OTHER): Payer: Medicare HMO | Admitting: Pulmonary Disease

## 2020-09-12 ENCOUNTER — Telehealth: Payer: Self-pay | Admitting: Pulmonary Disease

## 2020-09-12 ENCOUNTER — Encounter: Payer: Self-pay | Admitting: Pulmonary Disease

## 2020-09-12 ENCOUNTER — Ambulatory Visit: Payer: Medicare HMO | Admitting: Pulmonary Disease

## 2020-09-12 ENCOUNTER — Other Ambulatory Visit: Payer: Self-pay

## 2020-09-12 VITALS — BP 122/74 | HR 81 | Temp 97.2°F | Ht 61.0 in | Wt 139.2 lb

## 2020-09-12 DIAGNOSIS — J849 Interstitial pulmonary disease, unspecified: Secondary | ICD-10-CM

## 2020-09-12 DIAGNOSIS — J439 Emphysema, unspecified: Secondary | ICD-10-CM | POA: Diagnosis not present

## 2020-09-12 DIAGNOSIS — J310 Chronic rhinitis: Secondary | ICD-10-CM

## 2020-09-12 LAB — PULMONARY FUNCTION TEST
DL/VA % pred: 59 %
DL/VA: 2.49 ml/min/mmHg/L
DLCO cor % pred: 40 %
DLCO cor: 6.99 ml/min/mmHg
DLCO unc % pred: 40 %
DLCO unc: 6.99 ml/min/mmHg
FEF 25-75 Post: 1.03 L/sec
FEF 25-75 Pre: 0.76 L/sec
FEF2575-%Change-Post: 36 %
FEF2575-%Pred-Post: 67 %
FEF2575-%Pred-Pre: 49 %
FEV1-%Change-Post: 8 %
FEV1-%Pred-Post: 57 %
FEV1-%Pred-Pre: 53 %
FEV1-Post: 1.08 L
FEV1-Pre: 0.99 L
FEV1FVC-%Change-Post: -7 %
FEV1FVC-%Pred-Pre: 100 %
FEV6-%Change-Post: 17 %
FEV6-%Pred-Post: 65 %
FEV6-%Pred-Pre: 55 %
FEV6-Post: 1.54 L
FEV6-Pre: 1.31 L
FEV6FVC-%Change-Post: 0 %
FEV6FVC-%Pred-Post: 104 %
FEV6FVC-%Pred-Pre: 105 %
FVC-%Change-Post: 17 %
FVC-%Pred-Post: 62 %
FVC-%Pred-Pre: 52 %
FVC-Post: 1.55 L
FVC-Pre: 1.32 L
Post FEV1/FVC ratio: 70 %
Post FEV6/FVC ratio: 100 %
Pre FEV1/FVC ratio: 75 %
Pre FEV6/FVC Ratio: 100 %
RV % pred: 88 %
RV: 1.87 L
TLC % pred: 73 %
TLC: 3.39 L

## 2020-09-12 MED ORDER — PREDNISONE 10 MG PO TABS: ORAL_TABLET | ORAL | 0 refills | Status: DC

## 2020-09-12 MED ORDER — YUPELRI 175 MCG/3ML IN SOLN: 175.0000 ug | Freq: Every day | RESPIRATORY_TRACT | 1 refills | Status: DC

## 2020-09-12 MED ORDER — ARFORMOTEROL TARTRATE 15 MCG/2ML IN NEBU: 15.0000 ug | INHALATION_SOLUTION | Freq: Two times a day (BID) | RESPIRATORY_TRACT | 1 refills | Status: DC

## 2020-09-12 NOTE — Telephone Encounter (Signed)
Patient was seen today by JD. He had requested to send in prednisone 4x3, 3x3 etc and a zpak in for her. Per her chart, azithromycin will cause her to break out into a rash. He wants to see if she has tolerated augmentin or Doxy before. I called her but she did not answer. I left a message for her to call back.

## 2020-09-12 NOTE — Progress Notes (Signed)
PFT done today. 

## 2020-09-12 NOTE — Patient Instructions (Addendum)
Start Brovana nebulizer treatment 2 times per day  Start yupelri nebulizer treatment 1 time per day  You can use your albuterol and ipratropium nebulizer solution that you already have 2-3 times per day in the mean time.   Continue fluticasone nasal spray daily  Continue ipratropium nasal spray twice daily

## 2020-09-12 NOTE — Progress Notes (Signed)
Synopsis: Referred by Crist Fat, MD for pulmonary fibrosis  Subjective:   PATIENT ID: Jenny Kelly GENDER: female DOB: 1945-04-25, MRN: [id number]  Not using spiriva, made her feel funny Using flonase nasal spray Has sinus congestion and drainage. Has cough with clear or yellowish.  Using flonase and ipratropium    HPI  Chief Complaint  Patient presents with  . Follow-up    F/U after PFT. States her breathing has been great since last visit. She has kept a runny nose and cough for the past few weeks. States the phlegm is clear most of the times but will notice some yellowish phlegm.     Jenny Kelly is a 75 year old woman, former smoker with emphysema who returns to pulmonary clinic for follow up.   She was started on Spiriva after last visit but she did not tolerate this as it made her feel funny.  She continues to have sinus congestion and drainage along with cough and sputum production that is clear or yellowish despite using Flonase nasal spray daily.  She is using ipratropium nebulizer treatments 2-3 times per day.  Pulmonary function test today show mixed obstructive and restrictive defects along with moderate diffusion defect.   Past Medical History:  Diagnosis Date  . Allergy   . Anemia   . Anxiety   . Arthritis   . Asthma   . Atrial fibrillation (HCC)   . Blood transfusion without reported diagnosis   . Cataract   . CHF (congestive heart failure) (HCC)   . Chronic kidney disease   . Clotting disorder (HCC)   . COPD (chronic obstructive pulmonary disease) (HCC)   . Depression   . Diabetes mellitus without complication (HCC)   . GERD (gastroesophageal reflux disease)   . Heart murmur   . Hyperlipidemia   . Myocardial infarction (HCC)   . Neuromuscular disorder (HCC)    tremors  . Seizures (HCC)   . Sleep apnea   . Stroke (HCC)   . Thyroid disease      No family history on file.   Social History   Socioeconomic History  . Marital status:  Married    Spouse name: Vonna Kotyk  . Number of children: 3  . Years of education: Not on file  . Highest education level: Not on file  Occupational History  . Not on file  Tobacco Use  . Smoking status: Former Smoker    Packs/day: 0.50    Years: 30.00    Pack years: 15.00    Types: Cigarettes    Quit date: 06/26/2000    Years since quitting: 20.2  . Smokeless tobacco: Never Used  Vaping Use  . Vaping Use: Former  Substance and Sexual Activity  . Alcohol use: Not Currently  . Drug use: Never  . Sexual activity: Not on file  Other Topics Concern  . Not on file  Social History Narrative  . Not on file   Social Determinants of Health   Financial Resource Strain: Not on file  Food Insecurity: Not on file  Transportation Needs: Not on file  Physical Activity: Not on file  Stress: Not on file  Social Connections: Not on file  Intimate Partner Violence: Not on file     Allergies  Allergen Reactions  . Ciprofloxacin Hives    Ask patient  . Prochlorperazine Edisylate Other (See Comments)    Cant swallow Muscle cramping  . Prednisone   . Reduced Iso-Alpha Acids Complex   . Statins  Other (See Comments)    Muscle weakness   . Sulfa Antibiotics Swelling  . Azithromycin Rash  . Latex Rash     Outpatient Medications Prior to Visit  Medication Sig Dispense Refill  . acetaminophen (TYLENOL) 500 MG tablet Take 500 mg by mouth every 8 (eight) hours as needed.    Marland Kitchen albuterol (PROVENTIL HFA;VENTOLIN HFA) 108 (90 Base) MCG/ACT inhaler Inhale 2 puffs into the lungs every 6 (six) hours as needed for wheezing or shortness of breath.    Marland Kitchen amiodarone (PACERONE) 100 MG tablet Take 50 mg by mouth daily.    . calcium carbonate (TUMS - DOSED IN MG ELEMENTAL CALCIUM) 500 MG chewable tablet Chew 1 tablet by mouth as needed for indigestion or heartburn.    . donepezil (ARICEPT) 10 MG tablet Take 10 mg by mouth at bedtime.    Dorise Hiss 70 MG/ML SOAJ Inject into the skin. Inject into the skin  every 30 days    . ergocalciferol (VITAMIN D2) 1.25 MG (50000 UT) capsule Take 50,000 Units by mouth See admin instructions. Takes twice a week on Wednesday and Saturday    . fluticasone (FLONASE) 50 MCG/ACT nasal spray SPRAY 1 SPRAY INTO BOTH NOSTRILS DAILY. 48 mL 2  . ipratropium (ATROVENT) 0.03 % nasal spray Place 2 sprays into both nostrils every 12 (twelve) hours. 30 mL 12  . ketorolac (TORADOL) 10 MG tablet Take 10 mg by mouth every 6 (six) hours as needed.    Marland Kitchen levothyroxine (SYNTHROID) 88 MCG tablet Take 88 mcg by mouth daily before breakfast.    . lipase/protease/amylase (CREON) 12000 units CPEP capsule Take 24,000 Units by mouth 3 (three) times daily before meals.    . montelukast (SINGULAIR) 10 MG tablet Take 10 mg by mouth at bedtime.    Marland Kitchen nystatin (MYCOSTATIN/NYSTOP) powder Apply to affected areas as needed    . omeprazole (PRILOSEC) 40 MG capsule     . oxycodone (OXY-IR) 5 MG capsule Take 5 mg by mouth every 4 (four) hours as needed. Normally takes 1-2 times per day.    . pantoprazole (PROTONIX) 40 MG tablet Take 40 mg by mouth 2 (two) times daily.    . pregabalin (LYRICA) 50 MG capsule Take 50 mg by mouth 2 (two) times daily.    . rivaroxaban (XARELTO) 20 MG TABS tablet Take 20 mg by mouth daily with supper.    . topiramate (TOPAMAX) 200 MG tablet Take 200 mg by mouth 2 (two) times daily.    . fluconazole (DIFLUCAN) 150 MG tablet     . furosemide (LASIX) 10 MG/ML solution Take 20 mg by mouth as needed.    Marland Kitchen ipratropium (ATROVENT) 0.02 % nebulizer solution Take 2.5 mLs (0.5 mg total) by nebulization every 6 (six) hours as needed for wheezing or shortness of breath. 240 each 12  . ipratropium-albuterol (DUONEB) 0.5-2.5 (3) MG/3ML SOLN Take 3 mLs by nebulization 2 (two) times daily after a meal.    . Tiotropium Bromide Monohydrate (SPIRIVA RESPIMAT) 2.5 MCG/ACT AERS Inhale 2 puffs into the lungs daily. 4 g 6  . Tiotropium Bromide Monohydrate (SPIRIVA RESPIMAT) 2.5 MCG/ACT AERS Inhale  2 puffs into the lungs daily. 4 g 0  . levothyroxine (SYNTHROID) 125 MCG tablet     . levothyroxine (SYNTHROID, LEVOTHROID) 100 MCG tablet Take 100 mcg by mouth daily before breakfast.     No facility-administered medications prior to visit.    Review of Systems  Constitutional: Negative for chills, fever, malaise/fatigue and weight loss.  HENT: Positive for congestion. Negative for sinus pain and sore throat.   Eyes: Negative.   Respiratory: Positive for cough, sputum production and shortness of breath. Negative for hemoptysis and wheezing.   Cardiovascular: Negative for chest pain, palpitations, orthopnea, claudication and leg swelling.  Gastrointestinal: Negative for abdominal pain, diarrhea, heartburn, nausea and vomiting.  Genitourinary: Negative.   Musculoskeletal: Negative.   Neurological: Negative for dizziness, weakness and headaches.  Psychiatric/Behavioral: Negative.     Objective:   Vitals:   09/12/20 1601  BP: 122/74  Pulse: 81  Temp: (!) 97.2 F (36.2 C)  TempSrc: Temporal  SpO2: 97%  Weight: 139 lb 3.2 oz (63.1 kg)  Height: 5\' 1"  (1.549 m)     Physical Exam Constitutional:      Comments: thin  HENT:     Head: Normocephalic and atraumatic.     Nose: Nose normal.     Mouth/Throat:     Mouth: Mucous membranes are moist.     Pharynx: Oropharynx is clear.  Eyes:     General: No scleral icterus.    Conjunctiva/sclera: Conjunctivae normal.     Pupils: Pupils are equal, round, and reactive to light.  Cardiovascular:     Rate and Rhythm: Normal rate and regular rhythm.     Pulses: Normal pulses.     Heart sounds: Normal heart sounds. No murmur heard.   Pulmonary:     Effort: Pulmonary effort is normal.     Breath sounds: Normal breath sounds. No wheezing, rhonchi or rales.  Abdominal:     Palpations: Abdomen is soft.  Musculoskeletal:     Right lower leg: No edema.     Left lower leg: No edema.  Skin:    General: Skin is warm and dry.      Capillary Refill: Capillary refill takes less than 2 seconds.  Neurological:     General: No focal deficit present.     Mental Status: She is alert.  Psychiatric:        Mood and Affect: Mood normal.        Behavior: Behavior normal.        Thought Content: Thought content normal.        Judgment: Judgment normal.     CBC No results found for: WBC, RBC, HGB, HCT, PLT, MCV, MCH, MCHC, RDW, LYMPHSABS, MONOABS, EOSABS, BASOSABS   Chest imaging: CT Chest 06/20/20 Lungs/Pleura: Moderate centrilobular emphysema. Areas of scarring/fibrosis noted bilaterally in the upper lobes and lower lobes. No acute confluent opacities or effusions.  CXR 03/24/20 Background peripheral interstitial fibrotic pattern throughout both lungs, better demonstrated by previous chest CT. Some improvement in the right upper lobe airspace opacity. Residual areas of airspace disease remained in both upper lobes as well as the lower lobes compatible with multilobar pneumonia. No enlarging effusion or pneumothorax. Stable heart size. Previous coronary bypass changes. Aorta atherosclerotic. Trachea midline. Bones are osteopenic. Degenerative changes noted spine.  CTA Chest 03/22/20 Mediastinum/Nodes: Chronic adenopathy with asymmetric right hilar and paratracheal nodal enlargement, some with dystrophic coarse calcifications, likely post infectious/reactive.  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 There is superimposed acute airspace disease in the right upper lobe and to a lesser extent at the bilateral lung bases. Tracheobronchomalacia. No visible effusion or pneumothorax. Motion artifact. There is a background of emphysema which was better seen in 2020  PFT: PFT Results Latest Ref Rng & Units 09/12/2020  FVC-Pre L 1.32  FVC-Predicted Pre % 52  FVC-Post L 1.55  FVC-Predicted Post % 62  Pre FEV1/FVC % % 75  Post FEV1/FCV % % 70  FEV1-Pre L 0.99  FEV1-Predicted Pre %  53  FEV1-Post L 1.08  DLCO uncorrected ml/min/mmHg 6.99  DLCO UNC% % 40  DLCO corrected ml/min/mmHg 6.99  DLCO COR %Predicted % 40  DLVA Predicted % 59  TLC L 3.39  TLC % Predicted % 73  RV % Predicted % 88  PFT 2022: Mixed obstructive and restrictive defect with moderate diffusion defect.    Assessment & Plan:   Pulmonary emphysema, unspecified emphysema type (HCC)  Interstitial lung disease (HCC)  Rhinitis, unspecified type  Discussion: Jenny Kelly is a 75 year old woman, former smoker with emphysema who returns to pulmonary clinic for follow up.    She has upper lobe predominant emphysema with fibrotic changes after her covid 19 pnuemonia. Repeat CT Chest 06/20/20 shows chronic scarring/fibrosis in both upper lobes and moderate centrilobular emphysema.  She has chronic cough with sputum production and chronic rhinitis.   She seems to do better with nebulizer treatments rather than inhalers. Will start Brovana nebulizer treatments twice daily and yupelri nebulizer treatment daily.  She is to continue flonase nasal spray and ipratropium nasal spray for the rhinitis and post nasal drip.   Follow up in 3 months  Melody Comas, MD  Pulmonary & Critical Care Office: [phone]   See Amion for Pager Details    Current Outpatient Medications:  .  acetaminophen (TYLENOL) 500 MG tablet, Take 500 mg by mouth every 8 (eight) hours as needed., Disp: , Rfl:  .  albuterol (PROVENTIL HFA;VENTOLIN HFA) 108 (90 Base) MCG/ACT inhaler, Inhale 2 puffs into the lungs every 6 (six) hours as needed for wheezing or shortness of breath., Disp: , Rfl:  .  amiodarone (PACERONE) 100 MG tablet, Take 50 mg by mouth daily., Disp: , Rfl:  .  arformoterol (BROVANA) 15 MCG/2ML NEBU, Take 2 mLs (15 mcg total) by nebulization 2 (two) times daily., Disp: 360 mL, Rfl: 1 .  calcium carbonate (TUMS - DOSED IN MG ELEMENTAL CALCIUM) 500 MG chewable tablet, Chew 1 tablet by mouth as needed for  indigestion or heartburn., Disp: , Rfl:  .  donepezil (ARICEPT) 10 MG tablet, Take 10 mg by mouth at bedtime., Disp: , Rfl:  .  Erenumab-aooe 70 MG/ML SOAJ, Inject into the skin. Inject into the skin every 30 days, Disp: , Rfl:  .  ergocalciferol (VITAMIN D2) 1.25 MG (50000 UT) capsule, Take 50,000 Units by mouth See admin instructions. Takes twice a week on Wednesday and Saturday, Disp: , Rfl:  .  fluticasone (FLONASE) 50 MCG/ACT nasal spray, SPRAY 1 SPRAY INTO BOTH NOSTRILS DAILY., Disp: 48 mL, Rfl: 2 .  ipratropium (ATROVENT) 0.03 % nasal spray, Place 2 sprays into both nostrils every 12 (twelve) hours., Disp: 30 mL, Rfl: 12 .  ketorolac (TORADOL) 10 MG tablet, Take 10 mg by mouth every 6 (six) hours as needed., Disp: , Rfl:  .  levothyroxine (SYNTHROID) 88 MCG tablet, Take 88 mcg by mouth daily before breakfast., Disp: , Rfl:  .  lipase/protease/amylase (CREON) 12000 units CPEP capsule, Take 24,000 Units by mouth 3 (three) times daily before meals., Disp: , Rfl:  .  montelukast (SINGULAIR) 10 MG tablet, Take 10 mg by mouth at bedtime., Disp: , Rfl:  .  nystatin (MYCOSTATIN/NYSTOP) powder, Apply to affected areas as needed, Disp: , Rfl:  .  omeprazole (PRILOSEC) 40 MG capsule, , Disp: , Rfl:  .  oxycodone (OXY-IR)  5 MG capsule, Take 5 mg by mouth every 4 (four) hours as needed. Normally takes 1-2 times per day., Disp: , Rfl:  .  pantoprazole (PROTONIX) 40 MG tablet, Take 40 mg by mouth 2 (two) times daily., Disp: , Rfl:  .  predniSONE (DELTASONE) 10 MG tablet, Take 4 tabs by mouth once daily x4 days, then 3 tabs x4 days, 2 tabs x4 days, 1 tab x4 days and stop., Disp: 40 tablet, Rfl: 0 .  pregabalin (LYRICA) 50 MG capsule, Take 50 mg by mouth 2 (two) times daily., Disp: , Rfl:  .  revefenacin (YUPELRI) 175 MCG/3ML nebulizer solution, Take 3 mLs (175 mcg total) by nebulization daily., Disp: 90 mL, Rfl: 1 .  rivaroxaban (XARELTO) 20 MG TABS tablet, Take 20 mg by mouth daily with supper., Disp: ,  Rfl:  .  topiramate (TOPAMAX) 200 MG tablet, Take 200 mg by mouth 2 (two) times daily., Disp: , Rfl:  .  amoxicillin-clavulanate (AUGMENTIN) 875-125 MG tablet, Take 1 tablet by mouth 2 (two) times daily., Disp: 14 tablet, Rfl: 0

## 2020-09-13 MED ORDER — AMOXICILLIN-POT CLAVULANATE 875-125 MG PO TABS: 1.0000 | ORAL_TABLET | Freq: Two times a day (BID) | ORAL | 0 refills | Status: DC

## 2020-09-13 NOTE — Telephone Encounter (Signed)
JD please advise   pts husband called back and stated that augmentin or doxy is fine for the pt to take.

## 2020-09-13 NOTE — Telephone Encounter (Signed)
augmentin has been sent to the pts pharmacy.  I attempted to call the pts husband but there was not an answer.

## 2020-09-27 ENCOUNTER — Telehealth: Payer: Self-pay | Admitting: Pulmonary Disease

## 2020-09-27 NOTE — Telephone Encounter (Signed)
I called and spoke with husband Fayrene Fearing who said that [facility] does not cover [place] or [place]. I informed Fayrene Fearing that insurances have formularies of the meds that will cover and to reach out to them to see what they will cover that is like the ned meds and Dr. Francine Graven can choose which he like to send. Husband is going to reach out to [facility] and let us know. I will keep encounter open in triage for callback.

## 2020-10-04 NOTE — Telephone Encounter (Signed)
LMTCB

## 2020-10-11 ENCOUNTER — Other Ambulatory Visit: Payer: Self-pay | Admitting: Pulmonary Disease

## 2020-10-11 NOTE — Telephone Encounter (Signed)
Called and spoke to pt's husband, Fayrene Fearing. He states he spoke with [facility] and was told to allow 10 business days for delivery of formulary, today is the 10th day. I advised pt to give till the end of the week and to call back on Friday the 24th if he hasnt received the formulary and we will call [employer]. Pt was also needing to schedule a 3 month ROV, this has been scheduled for 12/13/20 with Dr. Francine Graven. Will keep encounter open to follow up.

## 2020-10-12 DIAGNOSIS — R053 Chronic cough: Secondary | ICD-10-CM | POA: Diagnosis not present

## 2020-10-12 DIAGNOSIS — J310 Chronic rhinitis: Secondary | ICD-10-CM | POA: Diagnosis not present

## 2020-10-14 MED ORDER — YUPELRI 175 MCG/3ML IN SOLN: 175.0000 ug | Freq: Every day | RESPIRATORY_TRACT | 3 refills | Status: DC

## 2020-10-14 MED ORDER — ARFORMOTEROL TARTRATE 15 MCG/2ML IN NEBU: 15.0000 ug | INHALATION_SOLUTION | Freq: Two times a day (BID) | RESPIRATORY_TRACT | 3 refills | Status: DC

## 2020-10-14 NOTE — Telephone Encounter (Signed)
Called and spoke with Fayrene Fearing at the number listed below.  He states that he looked over pt's drug formulary and the brovana and yupelri are not covered. I advised let's try [facility] and see if they can get this approved. They will do PA if needed. Fayrene Fearing wanted rxs to be sent there. I have sent them electronically, and faxed over her last ov note and the insurance card to [facility] at [phone]. I advised Fayrene Fearing that [place] with [facility] will be reaching out to them. He is aware to call back if still having issues obtaining meds.

## 2020-10-14 NOTE — Telephone Encounter (Signed)
Pt's husband returning a phone call. Pt's husband can be reached at [phone]

## 2020-10-21 ENCOUNTER — Telehealth: Payer: Self-pay | Admitting: Pulmonary Disease

## 2020-10-21 NOTE — Telephone Encounter (Signed)
Received a fax from [facility] stating that one of the patient's nebulized medications were not covered. I called [facility] and was told that her insurance will not cover Brovana but will cover Perforomist.   Dr. Francine Graven, are you ok with me with sending in a RX for the Perforomist?

## 2020-10-25 MED ORDER — FORMOTEROL FUMARATE 20 MCG/2ML IN NEBU: 20.0000 ug | INHALATION_SOLUTION | Freq: Two times a day (BID) | RESPIRATORY_TRACT | 5 refills | Status: DC

## 2020-10-25 NOTE — Telephone Encounter (Signed)
Perforomist has been sent electronically to [facility].

## 2020-10-31 ENCOUNTER — Other Ambulatory Visit: Payer: Self-pay | Admitting: Pulmonary Disease

## 2020-11-04 DIAGNOSIS — E039 Hypothyroidism, unspecified: Secondary | ICD-10-CM | POA: Diagnosis not present

## 2020-11-04 DIAGNOSIS — Z789 Other specified health status: Secondary | ICD-10-CM | POA: Diagnosis not present

## 2020-11-04 DIAGNOSIS — E673 Hypervitaminosis D: Secondary | ICD-10-CM | POA: Diagnosis not present

## 2020-11-04 DIAGNOSIS — E7800 Pure hypercholesterolemia, unspecified: Secondary | ICD-10-CM | POA: Diagnosis not present

## 2020-11-08 DIAGNOSIS — E039 Hypothyroidism, unspecified: Secondary | ICD-10-CM | POA: Diagnosis not present

## 2020-11-08 DIAGNOSIS — E673 Hypervitaminosis D: Secondary | ICD-10-CM | POA: Diagnosis not present

## 2020-11-09 ENCOUNTER — Telehealth: Payer: Self-pay | Admitting: Pulmonary Disease

## 2020-11-09 NOTE — Telephone Encounter (Signed)
Called [facility] and was on hold for over 10 mins. Will need to call back tomorrow.

## 2020-11-10 ENCOUNTER — Telehealth: Payer: Self-pay | Admitting: Pulmonary Disease

## 2020-11-10 MED ORDER — FORMOTEROL FUMARATE 20 MCG/2ML IN NEBU: 20.0000 ug | INHALATION_SOLUTION | Freq: Two times a day (BID) | RESPIRATORY_TRACT | 12 refills | Status: DC

## 2020-11-10 NOTE — Telephone Encounter (Signed)
disregard

## 2020-11-10 NOTE — Telephone Encounter (Signed)
Called and spoke to rep with [facility] and was advised the quantity for perforomist was incorrect. Script corrected and e-scribed back to [facility]. Rep verbalized understanding and denied any further questions or concerns at this time.

## 2020-12-13 ENCOUNTER — Other Ambulatory Visit: Payer: Self-pay

## 2020-12-13 ENCOUNTER — Encounter: Payer: Self-pay | Admitting: Pulmonary Disease

## 2020-12-13 ENCOUNTER — Ambulatory Visit: Payer: Medicare HMO | Admitting: Pulmonary Disease

## 2020-12-13 VITALS — BP 118/76 | HR 66 | Ht 61.0 in | Wt 147.2 lb

## 2020-12-13 DIAGNOSIS — J849 Interstitial pulmonary disease, unspecified: Secondary | ICD-10-CM

## 2020-12-13 DIAGNOSIS — J310 Chronic rhinitis: Secondary | ICD-10-CM | POA: Diagnosis not present

## 2020-12-13 DIAGNOSIS — J439 Emphysema, unspecified: Secondary | ICD-10-CM

## 2020-12-13 MED ORDER — TRELEGY ELLIPTA 100-62.5-25 MCG/INH IN AEPB: 1.0000 | INHALATION_SPRAY | Freq: Every day | RESPIRATORY_TRACT | 6 refills | Status: DC

## 2020-12-13 NOTE — Progress Notes (Signed)
Synopsis: Referred by Crist Fat, MD for pulmonary fibrosis  Subjective:   PATIENT ID: Jenny Kelly GENDER: female DOB: 12/17/1945, MRN: [id number]  HPI  Chief Complaint  Patient presents with   Follow-up    3 mo f/u. States she has been stable since last visit. Denies any new concerns.    Jenny Kelly is a 75 year old woman, former smoker with emphysema who returns to pulmonary clinic for follow up.   She was started on yupelri and perforomist nebulizer treatments after last visit.  She has not noticed much of a difference in her cough or sputum production.  She also expresses she does not like using the nebulizer treatments.  She did receive it prednisone from her primary care team which made the biggest difference in her cough and sputum production.  She continues to experience sinus congestion and drainage despite using fluticasone nasal spray daily and ipratropium nasal spray twice daily.  Past Medical History:  Diagnosis Date   Allergy    Anemia    Anxiety    Arthritis    Asthma    Atrial fibrillation (HCC)    Blood transfusion without reported diagnosis    Cataract    CHF (congestive heart failure) (HCC)    Chronic kidney disease    Clotting disorder (HCC)    COPD (chronic obstructive pulmonary disease) (HCC)    Depression    Diabetes mellitus without complication (HCC)    GERD (gastroesophageal reflux disease)    Heart murmur    Hyperlipidemia    Myocardial infarction (HCC)    Neuromuscular disorder (HCC)    tremors   Seizures (HCC)    Sleep apnea    Stroke [facility])    Thyroid disease      No family history on file.   Social History   Socioeconomic History   Marital status: Married    Spouse name: Vonna Kotyk   Number of children: 3   Years of education: Not on file   Highest education level: Not on file  Occupational History   Not on file  Tobacco Use   Smoking status: Former    Packs/day: 0.50    Years: 30.00    Pack years: 15.00    Types:  Cigarettes    Quit date: 06/26/2000    Years since quitting: 20.4   Smokeless tobacco: Never  Vaping Use   Vaping Use: Former  Substance and Sexual Activity   Alcohol use: Not Currently   Drug use: Never   Sexual activity: Not on file  Other Topics Concern   Not on file  Social History Narrative   Not on file   Social Determinants of Health   Financial Resource Strain: Not on file  Food Insecurity: Not on file  Transportation Needs: Not on file  Physical Activity: Not on file  Stress: Not on file  Social Connections: Not on file  Intimate Partner Violence: Not on file     Allergies  Allergen Reactions   Ciprofloxacin Hives    Ask patient   Prochlorperazine Edisylate Other (See Comments)    Cant swallow Muscle cramping   Prednisone    Reduced Iso-Alpha Acids Complex    Statins Other (See Comments)    Muscle weakness    Sulfa Antibiotics Swelling   Azithromycin Rash   Latex Rash     Outpatient Medications Prior to Visit  Medication Sig Dispense Refill   acetaminophen (TYLENOL) 500 MG tablet Take 500 mg by mouth every 8 (  eight) hours as needed.     albuterol (PROVENTIL HFA;VENTOLIN HFA) 108 (90 Base) MCG/ACT inhaler Inhale 2 puffs into the lungs every 6 (six) hours as needed for wheezing or shortness of breath.     amiodarone (PACERONE) 100 MG tablet Take 50 mg by mouth daily.     calcium carbonate (TUMS - DOSED IN MG ELEMENTAL CALCIUM) 500 MG chewable tablet Chew 1 tablet by mouth as needed for indigestion or heartburn.     donepezil (ARICEPT) 10 MG tablet Take 10 mg by mouth at bedtime.     Erenumab-aooe 70 MG/ML SOAJ Inject into the skin. Inject into the skin every 30 days     ergocalciferol (VITAMIN D2) 1.25 MG (50000 UT) capsule Take 50,000 Units by mouth See admin instructions. Takes twice a week on Wednesday and Saturday     fluticasone (FLONASE) 50 MCG/ACT nasal spray SPRAY 1 SPRAY INTO BOTH NOSTRILS DAILY. 48 mL 2   formoterol (PERFOROMIST) 20 MCG/2ML  nebulizer solution Take 2 mLs (20 mcg total) by nebulization 2 (two) times daily. 120 mL 12   ipratropium (ATROVENT) 0.03 % nasal spray Place 2 sprays into both nostrils every 12 (twelve) hours. 30 mL 12   ketorolac (TORADOL) 10 MG tablet Take 10 mg by mouth every 6 (six) hours as needed.     levothyroxine (SYNTHROID) 88 MCG tablet Take 88 mcg by mouth daily before breakfast.     lipase/protease/amylase (CREON) 12000 units CPEP capsule Take 24,000 Units by mouth 3 (three) times daily before meals.     montelukast (SINGULAIR) 10 MG tablet Take 10 mg by mouth at bedtime.     nystatin (MYCOSTATIN/NYSTOP) powder Apply to affected areas as needed     omeprazole (PRILOSEC) 40 MG capsule      oxycodone (OXY-IR) 5 MG capsule Take 5 mg by mouth every 4 (four) hours as needed. Normally takes 1-2 times per day.     pantoprazole (PROTONIX) 40 MG tablet Take 40 mg by mouth 2 (two) times daily.     predniSONE (DELTASONE) 10 MG tablet Take 4 tabs by mouth once daily x4 days, then 3 tabs x4 days, 2 tabs x4 days, 1 tab x4 days and stop. 40 tablet 0   pregabalin (LYRICA) 50 MG capsule Take 50 mg by mouth 2 (two) times daily.     revefenacin (YUPELRI) 175 MCG/3ML nebulizer solution Take 3 mLs (175 mcg total) by nebulization daily. 90 mL 3   rivaroxaban (XARELTO) 20 MG TABS tablet Take 20 mg by mouth daily with supper.     topiramate (TOPAMAX) 200 MG tablet Take 200 mg by mouth 2 (two) times daily.     amoxicillin-clavulanate (AUGMENTIN) 875-125 MG tablet Take 1 tablet by mouth 2 (two) times daily. 14 tablet 0   No facility-administered medications prior to visit.    Review of Systems  Constitutional:  Negative for chills, fever, malaise/fatigue and weight loss.  HENT:  Positive for congestion. Negative for sinus pain and sore throat.   Eyes: Negative.   Respiratory:  Positive for cough, sputum production and shortness of breath. Negative for hemoptysis and wheezing.   Cardiovascular:  Negative for chest pain,  palpitations, orthopnea, claudication and leg swelling.  Gastrointestinal:  Negative for abdominal pain, diarrhea, heartburn, nausea and vomiting.  Genitourinary: Negative.   Musculoskeletal: Negative.   Neurological:  Negative for dizziness, weakness and headaches.  Psychiatric/Behavioral: Negative.     Objective:   Vitals:   12/13/20 1101  BP: 118/76  Pulse: 66  SpO2: 99%  Weight: 66.8 kg  Height: 5\' 1"  (1.549 m)     Physical Exam Constitutional:      Comments: thin  HENT:     Head: Normocephalic and atraumatic.     Nose: Nose normal.     Mouth/Throat:     Mouth: Mucous membranes are moist.     Pharynx: Oropharynx is clear.  Eyes:     General: No scleral icterus.    Conjunctiva/sclera: Conjunctivae normal.     Pupils: Pupils are equal, round, and reactive to light.  Cardiovascular:     Rate and Rhythm: Normal rate and regular rhythm.     Pulses: Normal pulses.     Heart sounds: Normal heart sounds. No murmur heard. Pulmonary:     Effort: Pulmonary effort is normal.     Breath sounds: Decreased breath sounds present. No wheezing, rhonchi or rales.  Abdominal:     Palpations: Abdomen is soft.  Musculoskeletal:     Right lower leg: No edema.     Left lower leg: No edema.  Skin:    General: Skin is warm and dry.     Capillary Refill: Capillary refill takes less than 2 seconds.  Neurological:     General: No focal deficit present.     Mental Status: She is alert.  Psychiatric:        Mood and Affect: Mood normal.        Behavior: Behavior normal.        Thought Content: Thought content normal.        Judgment: Judgment normal.    CBC No results found for: WBC, RBC, HGB, HCT, PLT, MCV, MCH, MCHC, RDW, LYMPHSABS, MONOABS, EOSABS, BASOSABS   Chest imaging: CT Chest 06/20/20 Lungs/Pleura: Moderate centrilobular emphysema. Areas of scarring/fibrosis noted bilaterally in the upper lobes and lower lobes. No acute confluent opacities or effusions.  CXR  03/24/20 Background peripheral interstitial fibrotic pattern throughout both lungs, better demonstrated by previous chest CT. Some improvement in the right upper lobe airspace opacity. Residual areas of airspace disease remained in both upper lobes as well as the lower lobes compatible with multilobar pneumonia. No enlarging effusion or pneumothorax. Stable heart size. Previous coronary bypass changes. Aorta atherosclerotic. Trachea midline. Bones are osteopenic. Degenerative changes noted spine.  CTA Chest 03/22/20 Mediastinum/Nodes: Chronic adenopathy with asymmetric right hilar and paratracheal nodal enlargement, some with dystrophic coarse calcifications, likely post infectious/reactive.  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 There is superimposed acute airspace disease in the right upper lobe and to a lesser extent at the bilateral lung bases. Tracheobronchomalacia. No visible effusion or pneumothorax. Motion artifact. There is a background of emphysema which was better seen in 2020  PFT: PFT Results Latest Ref Rng & Units 09/12/2020  FVC-Pre L 1.32  FVC-Predicted Pre % 52  FVC-Post L 1.55  FVC-Predicted Post % 62  Pre FEV1/FVC % % 75  Post FEV1/FCV % % 70  FEV1-Pre L 0.99  FEV1-Predicted Pre % 53  FEV1-Post L 1.08  DLCO uncorrected ml/min/mmHg 6.99  DLCO UNC% % 40  DLCO corrected ml/min/mmHg 6.99  DLCO COR %Predicted % 40  DLVA Predicted % 59  TLC L 3.39  TLC % Predicted % 73  RV % Predicted % 88  PFT 2022: Mixed obstructive and restrictive defect with moderate diffusion defect.    Assessment & Plan:   Pulmonary emphysema, unspecified emphysema type (HCC) - Plan: Fluticasone-Umeclidin-Vilant (TRELEGY ELLIPTA) 100-62.5-25 MCG/INH AEPB  Interstitial lung disease (  HCC)  Rhinitis, unspecified type  Discussion: Jenny Kelly is a 75 year old woman, former smoker with emphysema who returns to pulmonary clinic for follow up.     She has upper lobe predominant emphysema with fibrotic changes after her covid 19 pnuemonia. Repeat CT Chest 06/20/20 shows chronic scarring/fibrosis in both upper lobes and moderate centrilobular emphysema.  She has chronic cough with sputum production and chronic rhinitis.   She did not tolerate Spiriva inhaler therapy as this made her feel funny.  We trialed her on LABA/LAMA nebulizer therapy with Perforomist and Yupelri which she has not noticed much change in her cough.  We will trial her on Trelegy Ellipta 1 puff daily and monitor for any symptom improvement.  She is to continue flonase nasal spray and ipratropium nasal spray for the rhinitis and post nasal drip.   Follow up in 6 months  Melody Comas, MD [facility] Office: [phone]   See Amion for Pager Details    Current Outpatient Medications:    acetaminophen (TYLENOL) 500 MG tablet, Take 500 mg by mouth every 8 (eight) hours as needed., Disp: , Rfl:    albuterol (PROVENTIL HFA;VENTOLIN HFA) 108 (90 Base) MCG/ACT inhaler, Inhale 2 puffs into the lungs every 6 (six) hours as needed for wheezing or shortness of breath., Disp: , Rfl:    amiodarone (PACERONE) 100 MG tablet, Take 50 mg by mouth daily., Disp: , Rfl:    calcium carbonate (TUMS - DOSED IN MG ELEMENTAL CALCIUM) 500 MG chewable tablet, Chew 1 tablet by mouth as needed for indigestion or heartburn., Disp: , Rfl:    donepezil (ARICEPT) 10 MG tablet, Take 10 mg by mouth at bedtime., Disp: , Rfl:    Erenumab-aooe 70 MG/ML SOAJ, Inject into the skin. Inject into the skin every 30 days, Disp: , Rfl:    ergocalciferol (VITAMIN D2) 1.25 MG (50000 UT) capsule, Take 50,000 Units by mouth See admin instructions. Takes twice a week on Wednesday and Saturday, Disp: , Rfl:    fluticasone (FLONASE) 50 MCG/ACT nasal spray, SPRAY 1 SPRAY INTO BOTH NOSTRILS DAILY., Disp: 48 mL, Rfl: 2   Fluticasone-Umeclidin-Vilant (TRELEGY ELLIPTA) 100-62.5-25 MCG/INH AEPB,  Inhale 1 puff into the lungs daily., Disp: 28 each, Rfl: 6   formoterol (PERFOROMIST) 20 MCG/2ML nebulizer solution, Take 2 mLs (20 mcg total) by nebulization 2 (two) times daily., Disp: 120 mL, Rfl: 12   ipratropium (ATROVENT) 0.03 % nasal spray, Place 2 sprays into both nostrils every 12 (twelve) hours., Disp: 30 mL, Rfl: 12   ketorolac (TORADOL) 10 MG tablet, Take 10 mg by mouth every 6 (six) hours as needed., Disp: , Rfl:    levothyroxine (SYNTHROID) 88 MCG tablet, Take 88 mcg by mouth daily before breakfast., Disp: , Rfl:    lipase/protease/amylase (CREON) 12000 units CPEP capsule, Take 24,000 Units by mouth 3 (three) times daily before meals., Disp: , Rfl:    montelukast (SINGULAIR) 10 MG tablet, Take 10 mg by mouth at bedtime., Disp: , Rfl:    nystatin (MYCOSTATIN/NYSTOP) powder, Apply to affected areas as needed, Disp: , Rfl:    omeprazole (PRILOSEC) 40 MG capsule, , Disp: , Rfl:    oxycodone (OXY-IR) 5 MG capsule, Take 5 mg by mouth every 4 (four) hours as needed. Normally takes 1-2 times per day., Disp: , Rfl:    pantoprazole (PROTONIX) 40 MG tablet, Take 40 mg by mouth 2 (two) times daily., Disp: , Rfl:    predniSONE (DELTASONE) 10 MG tablet,  Take 4 tabs by mouth once daily x4 days, then 3 tabs x4 days, 2 tabs x4 days, 1 tab x4 days and stop., Disp: 40 tablet, Rfl: 0   pregabalin (LYRICA) 50 MG capsule, Take 50 mg by mouth 2 (two) times daily., Disp: , Rfl:    revefenacin (YUPELRI) 175 MCG/3ML nebulizer solution, Take 3 mLs (175 mcg total) by nebulization daily., Disp: 90 mL, Rfl: 3   rivaroxaban (XARELTO) 20 MG TABS tablet, Take 20 mg by mouth daily with supper., Disp: , Rfl:    topiramate (TOPAMAX) 200 MG tablet, Take 200 mg by mouth 2 (two) times daily., Disp: , Rfl:

## 2020-12-13 NOTE — Patient Instructions (Signed)
Start trelegy ellipta 1 puff daily - rinse mouth out after each use  You can stop using the nebulizer treatments while using trelegy ellipta inhaler  You can use albuterol inhaler as needed 1-2 puffs every 4-6 hours for shortness of breath and cough.

## 2020-12-14 ENCOUNTER — Encounter: Payer: Self-pay | Admitting: Pulmonary Disease

## 2020-12-21 ENCOUNTER — Encounter: Payer: Self-pay | Admitting: Allergy and Immunology

## 2020-12-21 ENCOUNTER — Ambulatory Visit: Payer: Medicare HMO | Admitting: Allergy and Immunology

## 2020-12-21 ENCOUNTER — Other Ambulatory Visit: Payer: Self-pay

## 2020-12-21 VITALS — BP 108/62 | HR 74 | Resp 18 | Ht 61.0 in | Wt 147.0 lb

## 2020-12-21 DIAGNOSIS — J449 Chronic obstructive pulmonary disease, unspecified: Secondary | ICD-10-CM | POA: Diagnosis not present

## 2020-12-21 DIAGNOSIS — K219 Gastro-esophageal reflux disease without esophagitis: Secondary | ICD-10-CM | POA: Diagnosis not present

## 2020-12-21 DIAGNOSIS — J4489 Other specified chronic obstructive pulmonary disease: Secondary | ICD-10-CM

## 2020-12-21 DIAGNOSIS — J3089 Other allergic rhinitis: Secondary | ICD-10-CM | POA: Diagnosis not present

## 2020-12-21 DIAGNOSIS — N183 Chronic kidney disease, stage 3 unspecified: Secondary | ICD-10-CM | POA: Diagnosis not present

## 2020-12-21 DIAGNOSIS — E785 Hyperlipidemia, unspecified: Secondary | ICD-10-CM | POA: Diagnosis not present

## 2020-12-21 MED ORDER — ALBUTEROL SULFATE HFA 108 (90 BASE) MCG/ACT IN AERS: INHALATION_SPRAY | RESPIRATORY_TRACT | 1 refills | Status: DC

## 2020-12-21 MED ORDER — AZELASTINE HCL 0.1 % NA SOLN: NASAL | 5 refills | Status: DC

## 2020-12-21 MED ORDER — FAMOTIDINE 40 MG PO TABS: ORAL_TABLET | ORAL | 5 refills | Status: DC

## 2020-12-21 NOTE — Patient Instructions (Addendum)
  1.  Allergen avoidance measures?  2.  Treat and prevent inflammation:  A. Continue Trelegy 100 - 1 inhalation 1 time per day B. Continue Fluticasone - 1 spray each nostril 2 times per day C. Continue Atrovent (Ipratropium 0.06%) - 1 spray each nostril 2 times per day D. Start Azelastine - 1 spray each nostril 2 times per day  3.  Treat and prevent reflux/LPR:  A. Continue omeprazole 40 mg - 1 tablet 2 times per day B. Start Famotidine 40 mg - 1 tablet in evening C. Eliminate all forms or caffeine  4.  If needed:  A. Albuterol HFA - 2 inhalations every 4-6 hours B. Loratadine 10 mg - 1 tablet 1-2 times per day  5. Return to clinic in 4 weeks or earlier if problem  6. Plan for fall flu vaccine

## 2020-12-21 NOTE — Progress Notes (Signed)
[place] - [place] - [place] - [place]   Dear [name],  Thank you for referring Jenny Kelly to the [facility] on 12/21/2020.   Below is a summation of this patient's evaluation and recommendations.  Thank you for your referral. I will keep you informed about this patient's response to treatment.   If you have any questions please do not hesitate to contact me.   Sincerely,  Jessica Priest, MD Allergy / Immunology [facility]   ______________________________________________________________________    NEW PATIENT NOTE  Referring Provider: Crist Fat, MD Primary Provider: Leonia Reader, Barbara Cower, MD Date of office visit: 12/21/2020    Subjective:   Chief Complaint:  Jenny Kelly (DOB: Jan 05, 1946) is a 75 y.o. female who presents to the clinic on 12/21/2020 with a chief complaint of Cough and Wheezing .     HPI: Tuwanda presents to this clinic in evaluation of cough.  She carries the diagnosis of smoking associated COPD and is followed by Dr. Francine Graven, pulmonology, and she also had a rather significant problem with her breathing ever since her COVID infection in April 2020 apparently associated with post-COVID pulmonary fibrosis.  Her big complaint is that she coughs during the daytime and at nighttime.  She has gagging and retching associated with her cough and she sometimes urinates on herself with her cough.  She has constant throat clearing and a coating in her throat and cannot clear out her throat and has raspy voice.  She has been tried on a multitude of anti-inflammatory agents for her airway and currently is using a triple inhaler, yet still remains symptomatic  She also has some nose blowing and some intermittent anosmia but does not have a history of ugly nasal discharge.  She is currently using a nasal steroid and nasal ipratropium.  She does not have  reflux as far she knows but she is using omeprazole twice a day.  Apparently this PPI was given to her by her gastroenterologist for possible "pancreatitis".  She has a cheer wine caffeinated drink every day.  She informs me that she had coronary artery stents placed in 2020 which apparently was complicated by pulmonary embolus and then had a cardiac bypass surgery in 2015.  She has received 2 COVID vaccines and as noted above sustained a COVID infection in April 2020 associated with pneumonitis requiring hospitalization for 15 days.  Past Medical History:  Diagnosis Date   Allergy    Anemia    Anxiety    Arthritis    Asthma    Atrial fibrillation (HCC)    Blood transfusion without reported diagnosis    Cataract    CHF (congestive heart failure) (HCC)    Chronic kidney disease    Clotting disorder (HCC)    COPD (chronic obstructive pulmonary disease) (HCC)    Depression    Diabetes mellitus without complication (HCC)    GERD (gastroesophageal reflux disease)    Heart murmur    Hyperlipidemia    Myocardial infarction (HCC)    Neuromuscular disorder (HCC)    tremors   Seizures (HCC)    Sleep apnea    Stroke (HCC)    Thyroid disease     Past Surgical History:  Procedure Laterality Date   ABDOMINAL HYSTERECTOMY     APPENDECTOMY     bladder tack     CHOLECYSTECTOMY     CORONARY ARTERY  BYPASS GRAFT     ESOPHAGOGASTRODUODENOSCOPY ENDOSCOPY     EYE SURGERY     FRACTURE SURGERY Right    3rd finger   JOINT REPLACEMENT Bilateral    knees   TONSILLECTOMY AND ADENOIDECTOMY     TUBAL LIGATION      Allergies as of 12/21/2020       Reactions   Ciprofloxacin Hives   Ask patient   Prochlorperazine Edisylate Other (See Comments)   Cant swallow Muscle cramping   Prednisone    Reduced Iso-alpha Acids Complex    Statins Other (See Comments)   Muscle weakness   Sulfa Antibiotics Swelling   Azithromycin Rash   Latex Rash        Medication List    albuterol 108 (90  Base) MCG/ACT inhaler Commonly known as: VENTOLIN HFA Inhale 2 puffs into the lungs every 6 (six) hours as needed for wheezing or shortness of breath.   amiodarone 100 MG tablet Commonly known as: PACERONE Take 50 mg by mouth daily.   donepezil 10 MG tablet Commonly known as: ARICEPT Take 10 mg by mouth at bedtime.   Erenumab-aooe 70 MG/ML Soaj Inject into the skin. Inject into the skin every 30 days   ergocalciferol 1.25 MG (50000 UT) capsule Commonly known as: VITAMIN D2 Take 50,000 Units by mouth See admin instructions. Takes twice a week on Wednesday and Saturday   fluticasone 50 MCG/ACT nasal spray Commonly known as: FLONASE SPRAY 1 SPRAY INTO BOTH NOSTRILS DAILY.   ipratropium 0.03 % nasal spray Commonly known as: ATROVENT Place 2 sprays into both nostrils every 12 (twelve) hours.   levothyroxine 88 MCG tablet Commonly known as: SYNTHROID Take 88 mcg by mouth daily before breakfast.   omeprazole 40 MG capsule Commonly known as: PRILOSEC   pregabalin 50 MG capsule Commonly known as: LYRICA Take 50 mg by mouth 2 (two) times daily.   rivaroxaban 20 MG Tabs tablet Commonly known as: XARELTO Take 20 mg by mouth daily with supper.   topiramate 200 MG tablet Commonly known as: TOPAMAX Take 200 mg by mouth 2 (two) times daily.   Trelegy Ellipta 100-62.5-25 MCG/INH Aepb Generic drug: Fluticasone-Umeclidin-Vilant Inhale 1 puff into the lungs daily.    Review of systems negative except as noted in HPI / PMHx or noted below:  Review of Systems  Constitutional: Negative.   HENT: Negative.    Eyes: Negative.   Respiratory: Negative.    Cardiovascular: Negative.   Gastrointestinal: Negative.   Genitourinary: Negative.   Musculoskeletal: Negative.   Skin: Negative.   Neurological: Negative.   Endo/Heme/Allergies: Negative.   Psychiatric/Behavioral: Negative.     Family History  Family history unknown: Yes    Social History   Socioeconomic History    Marital status: Married    Spouse name: Vonna Kotyk   Number of children: 3   Years of education: Not on file   Highest education level: Not on file  Occupational History   Not on file  Tobacco Use   Smoking status: Former    Packs/day: 0.50    Years: 30.00    Pack years: 15.00    Types: Cigarettes    Quit date: 06/26/2000    Years since quitting: 20.5   Smokeless tobacco: Never  Vaping Use   Vaping Use: Former  Substance and Sexual Activity   Alcohol use: Not Currently   Drug use: Never   Sexual activity: Not on file  Other Topics Concern   Not on file  Social  History Narrative   Not on file   Environmental and Social history  Lives in a house with a dry environment, a dog located inside the household, no carpet in the bedroom, no plastic on the bed, no plastic on the pillow, no smoking ongoing with inside the household.  Objective:   Vitals:   12/21/20 1415  BP: 108/62  Pulse: 74  Resp: 18  SpO2: 93%   Height: 5\' 1"  (154.9 cm) Weight: 147 lb (66.7 kg)  Physical Exam Constitutional:      Appearance: She is not diaphoretic.  HENT:     Head: Normocephalic.     Right Ear: Tympanic membrane, ear canal and external ear normal.     Left Ear: Tympanic membrane, ear canal and external ear normal.     Nose: Nose normal. No mucosal edema or rhinorrhea.     Mouth/Throat:     Pharynx: Uvula midline. No oropharyngeal exudate.  Eyes:     Conjunctiva/sclera: Conjunctivae normal.  Neck:     Thyroid: No thyromegaly.     Trachea: Trachea normal. No tracheal tenderness or tracheal deviation.  Cardiovascular:     Rate and Rhythm: Normal rate and regular rhythm.     Heart sounds: Normal heart sounds, S1 normal and S2 normal. No murmur heard. Pulmonary:     Effort: No respiratory distress.     Breath sounds: Normal breath sounds. No stridor. No wheezing (Scattered expiratory wheezes posterior and anterior lung fields) or rales.  Lymphadenopathy:     Head:     Right side of head: No  tonsillar adenopathy.     Left side of head: No tonsillar adenopathy.     Cervical: No cervical adenopathy.  Skin:    Findings: No erythema or rash.     Nails: There is no clubbing.  Neurological:     Mental Status: She is alert.    Diagnostics: Allergy skin tests were performed.  She did not demonstrate any hypersensitivity against a screening panel of aeroallergens.  Spirometry was performed and demonstrated an FEV1 of 1.12 @ 60 % of predicted. FEV1/FVC = 0.86  Results of pulmonary function testing obtained 12 Sep 2020 identified TLC 73% predicted, RV 88% predicted, DL/VA 14 Sep 2020 predicted   Assessment and Plan:    1. COPD with asthma (HCC)   2. Perennial allergic rhinitis   3. LPRD (laryngopharyngeal reflux disease)     1.  Allergen avoidance measures?  2.  Treat and prevent inflammation:  A. Continue Trelegy 100 - 1 inhalation 1 time per day B. Continue Fluticasone - 1 spray each nostril 2 times per day C. Continue Atrovent (Ipratropium 0.06%) - 1 spray each nostril 2 times per day D. Start Azelastine - 1 spray each nostril 2 times per day  3.  Treat and prevent reflux/LPR:  A. Continue omeprazole 40 mg - 1 tablet 2 times per day B. Start Famotidine 40 mg - 1 tablet in evening C. Eliminate all forms or caffeine  4.  If needed:  A. Albuterol HFA - 2 inhalations every 4-6 hours B. Loratadine 10 mg - 1 tablet 1-2 times per day  5. Return to clinic in 4 weeks or earlier if problem  6. Plan for fall flu vaccine   Adalea has significant architectural changes within her airway most likely from her prolonged smoking history and also from her COVID infection.  But her constant coughing and laryngeal issues suggest that there may be other triggers contributing to her respiratory tract symptoms besides  for her COPD and I am going to have her be a little bit more aggressive about treating reflux and also her upper airway inflammatory condition with the therapy noted above.  I will  see her back in this clinic in 4 weeks to assess her response to this approach and consider further evaluation and treatment based upon her response.  Jessica Priest, MD Allergy / Immunology [facility]

## 2020-12-22 ENCOUNTER — Encounter: Payer: Self-pay | Admitting: Allergy and Immunology

## 2021-01-18 ENCOUNTER — Other Ambulatory Visit: Payer: Self-pay

## 2021-01-18 ENCOUNTER — Encounter: Payer: Self-pay | Admitting: Allergy and Immunology

## 2021-01-18 ENCOUNTER — Ambulatory Visit: Payer: Medicare HMO | Admitting: Allergy and Immunology

## 2021-01-18 VITALS — BP 108/60 | HR 60 | Resp 16

## 2021-01-18 DIAGNOSIS — Z309 Encounter for contraceptive management, unspecified: Secondary | ICD-10-CM | POA: Diagnosis not present

## 2021-01-18 DIAGNOSIS — K219 Gastro-esophageal reflux disease without esophagitis: Secondary | ICD-10-CM

## 2021-01-18 DIAGNOSIS — J3089 Other allergic rhinitis: Secondary | ICD-10-CM

## 2021-01-18 DIAGNOSIS — Z23 Encounter for immunization: Secondary | ICD-10-CM | POA: Diagnosis not present

## 2021-01-18 DIAGNOSIS — J309 Allergic rhinitis, unspecified: Secondary | ICD-10-CM | POA: Diagnosis not present

## 2021-01-18 DIAGNOSIS — J449 Chronic obstructive pulmonary disease, unspecified: Secondary | ICD-10-CM | POA: Diagnosis not present

## 2021-01-18 MED ORDER — BUDESONIDE 0.5 MG/2ML IN SUSP: 0.5000 mg | Freq: Two times a day (BID) | RESPIRATORY_TRACT | 5 refills | Status: DC

## 2021-01-18 NOTE — Progress Notes (Signed)
[name] - [place] - [place] - [place] - [place]   Follow-up Note  Referring Provider: Crist Fat, MD Primary Provider: Leonia Kelly, Jenny Cower, MD Date of Office Visit: 01/18/2021  Subjective:   Jenny Kelly (DOB: 22-Dec-1945) is a 75 y.o. female who [facility] on 01[facility]18/2021 in re-evaluation of the following:  HPI: Jenny Kelly returns to this clinic in evaluation of COPD, possible component of asthma, allergic rhinitis, and LPR.  I last saw her in this clinic during her initial evaluation 21 December 2020.  She still continues to cough.  She still has gagging and retching.  She still has constant throat clearing and a coating in her throat.  She still coughs so hard that she pees on herself.  Her nose is doing pretty well at this point in time.  She has not been having any reflux.  She had to discontinue her famotidine because she thought it was giving her night sweats.  Allergies as of 01/18/2021       Reactions   Ciprofloxacin Hives   Ask patient   Prochlorperazine Edisylate Other (See Comments)   Cant swallow Muscle cramping   Prednisone    Reduced Iso-alpha Acids Complex    Statins Other (See Comments)   Muscle weakness   Sulfa Antibiotics Swelling   Azithromycin Rash   Latex Rash        Medication List    albuterol 108 (90 Base) MCG/ACT inhaler Commonly known as: Proventil HFA Inhale two puffs every 4-6 hours if needed for coughing, wheezing, or shortness of breath.   amiodarone 100 MG tablet Commonly known as: PACERONE Take 50 mg by mouth daily.   azelastine 0.1 % nasal spray Commonly known as: ASTELIN Use one spray in each nostril twice daily   donepezil 10 MG tablet Commonly known as: ARICEPT Take 10 mg by mouth at bedtime.   Erenumab-aooe 70 MG/ML Soaj Inject into the skin. Inject into the skin every 30 days   ergocalciferol 1.25 MG (50000 UT) capsule Commonly known as: VITAMIN D2 Take 50,000 Units by mouth See  admin instructions. Takes twice a week on Wednesday and Saturday   famotidine 40 MG tablet Commonly known as: PEPCID Take one tablet once daily   fluticasone 50 MCG/ACT nasal spray Commonly known as: FLONASE SPRAY 1 SPRAY INTO BOTH NOSTRILS DAILY.   ipratropium 0.03 % nasal spray Commonly known as: ATROVENT Place 2 sprays into both nostrils every 12 (twelve) hours.   levothyroxine 88 MCG tablet Commonly known as: SYNTHROID Take 88 mcg by mouth daily before breakfast.   omeprazole 40 MG capsule Commonly known as: PRILOSEC   pregabalin 50 MG capsule Commonly known as: LYRICA Take 50 mg by mouth 2 (two) times daily.   rivaroxaban 20 MG Tabs tablet Commonly known as: XARELTO Take 20 mg by mouth daily with supper.   topiramate 200 MG tablet Commonly known as: TOPAMAX Take 200 mg by mouth 2 (two) times daily.   Trelegy Ellipta 100-62.5-25 MCG/INH Aepb Generic drug: Fluticasone-Umeclidin-Vilant Inhale 1 puff into the lungs daily.    Past Medical History:  Diagnosis Date   Allergy    Anemia    Anxiety    Arthritis    Asthma    Atrial fibrillation (HCC)    Blood transfusion without reported diagnosis    Cataract    CHF (congestive heart failure) (HCC)    Chronic kidney disease    Clotting disorder (HCC)    COPD (chronic  obstructive pulmonary disease) (HCC)    Depression    Diabetes mellitus without complication (HCC)    GERD (gastroesophageal reflux disease)    Heart murmur    Hyperlipidemia    Myocardial infarction (HCC)    Neuromuscular disorder (HCC)    tremors   Seizures (HCC)    Sleep apnea    Stroke (HCC)    Thyroid disease     Past Surgical History:  Procedure Laterality Date   ABDOMINAL HYSTERECTOMY     APPENDECTOMY     bladder tack     CHOLECYSTECTOMY     CORONARY ARTERY BYPASS GRAFT     ESOPHAGOGASTRODUODENOSCOPY ENDOSCOPY     EYE SURGERY     FRACTURE SURGERY Right    3rd finger   JOINT REPLACEMENT Bilateral    knees   TONSILLECTOMY AND  ADENOIDECTOMY     TUBAL LIGATION      Review of systems negative except as noted in HPI / PMHx or noted below:  Review of Systems  Constitutional: Negative.   HENT: Negative.    Eyes: Negative.   Respiratory: Negative.    Cardiovascular: Negative.   Gastrointestinal: Negative.   Genitourinary: Negative.   Musculoskeletal: Negative.   Skin: Negative.   Neurological: Negative.   Endo/Heme/Allergies: Negative.   Psychiatric/Behavioral: Negative.      Objective:   Vitals:   01/18/21 1549  BP: 108/60  Pulse: 60  Resp: 16  SpO2: 93%          Physical Exam Constitutional:      Appearance: She is not diaphoretic.     Comments: Cough  HENT:     Head: Normocephalic.     Right Ear: Tympanic membrane, ear canal and external ear normal.     Left Ear: Tympanic membrane, ear canal and external ear normal.     Nose: Nose normal. No mucosal edema or rhinorrhea.     Mouth/Throat:     Pharynx: Uvula midline. No oropharyngeal exudate.  Eyes:     Conjunctiva/sclera: Conjunctivae normal.  Neck:     Thyroid: No thyromegaly.     Trachea: Trachea normal. No tracheal tenderness or tracheal deviation.  Cardiovascular:     Rate and Rhythm: Normal rate and regular rhythm.     Heart sounds: Normal heart sounds, S1 normal and S2 normal. No murmur heard. Pulmonary:     Effort: No respiratory distress.     Breath sounds: No stridor. Wheezing (Bilateral inspiratory and expiratory wheezing all lung fields) present. No rales.  Lymphadenopathy:     Head:     Right side of head: No tonsillar adenopathy.     Left side of head: No tonsillar adenopathy.     Cervical: No cervical adenopathy.  Skin:    Findings: No erythema or rash.     Nails: There is no clubbing.  Neurological:     Mental Status: She is alert.    Diagnostics: none  Assessment and Plan:   1. COPD with asthma (HCC)   2. Perennial allergic rhinitis   3. LPRD (laryngopharyngeal reflux disease)     1.  Treat and prevent  inflammation:  A. Budesonide 0.5 - nebulize 2 times per day (replaces Trelegy) B. Fluticasone - 1 spray each nostril 2 times per day C. Atrovent (Ipratropium 0.06%) - 1 spray each nostril 2 times per day D. Azelastine - 1 spray each nostril 2 times per day  2.  Treat and prevent reflux/LPR:  A. Continue omeprazole 40 mg - 1 tablet  2 times per day B. Restart Famotidine 40 mg - 1 tablet in MORNING C. Eliminate all forms or caffeine  3.  If needed:  A. Albuterol HFA - 2 inhalations every 4-6 hours B. Loratadine 10 mg - 1 tablet 1-2 times per day  4. Blood - CBC w/d, Area 2 aeroallegen profile, alpha-1 antitrypsin level and phenotype. Further treatment???  5. Plan for fall flu vaccine   6. Return to clinic in 8 weeks or earlier if problem.    I will have Hawraa utilize nebulized budesonide as I suspect she is not really getting any of the Trelegy into her lungs and it has not really helped her to any degree.  I would like for her to use her famotidine in conjunction with her omeprazole but will start her famotidine in the morning for she apparently developed some night sweats with this agent if she takes it too late.  I am going to investigate if she may be a candidate for a biologic agents and also screen her for alpha 1 antitrypsin deficiency.  I will contact her with the results of her blood test once they are available for review.  I will see her back in this clinic in 8 weeks.  Laurette Schimke, MD Allergy / Immunology [facility]

## 2021-01-18 NOTE — Patient Instructions (Addendum)
  1.  Treat and prevent inflammation:  A. Budesonide 0.5 - nebulize 2 times per day (replaces Trelegy) B. Fluticasone - 1 spray each nostril 2 times per day C. Atrovent (Ipratropium 0.06%) - 1 spray each nostril 2 times per day D. Azelastine - 1 spray each nostril 2 times per day  2.  Treat and prevent reflux/LPR:  A. Continue omeprazole 40 mg - 1 tablet 2 times per day B. Restart Famotidine 40 mg - 1 tablet in MORNING C. Eliminate all forms or caffeine  3.  If needed:  A. Albuterol HFA - 2 inhalations every 4-6 hours B. Loratadine 10 mg - 1 tablet 1-2 times per day  4. Blood - CBC w/d, Area 2 aeroallegen profile, alpha-1 antitrypsin level and phenotype. Further treatment???  5. Plan for fall flu vaccine   6. Return to clinic in 8 weeks or earlier if problem.

## 2021-01-19 ENCOUNTER — Encounter: Payer: Self-pay | Admitting: Allergy and Immunology

## 2021-03-09 DIAGNOSIS — T452X1D Poisoning by vitamins, accidental (unintentional), subsequent encounter: Secondary | ICD-10-CM | POA: Diagnosis not present

## 2021-03-09 DIAGNOSIS — E7800 Pure hypercholesterolemia, unspecified: Secondary | ICD-10-CM | POA: Diagnosis not present

## 2021-03-09 DIAGNOSIS — E673 Hypervitaminosis D: Secondary | ICD-10-CM | POA: Diagnosis not present

## 2021-03-09 DIAGNOSIS — I10 Essential (primary) hypertension: Secondary | ICD-10-CM | POA: Diagnosis not present

## 2021-03-15 ENCOUNTER — Encounter: Payer: Self-pay | Admitting: Allergy and Immunology

## 2021-03-15 ENCOUNTER — Ambulatory Visit: Payer: Medicare HMO | Admitting: Allergy and Immunology

## 2021-03-15 ENCOUNTER — Other Ambulatory Visit: Payer: Self-pay

## 2021-03-15 VITALS — BP 110/60 | HR 75 | Resp 16

## 2021-03-15 DIAGNOSIS — J449 Chronic obstructive pulmonary disease, unspecified: Secondary | ICD-10-CM

## 2021-03-15 DIAGNOSIS — J3089 Other allergic rhinitis: Secondary | ICD-10-CM

## 2021-03-15 DIAGNOSIS — D7219 Other eosinophilia: Secondary | ICD-10-CM | POA: Diagnosis not present

## 2021-03-15 DIAGNOSIS — K219 Gastro-esophageal reflux disease without esophagitis: Secondary | ICD-10-CM | POA: Diagnosis not present

## 2021-03-15 NOTE — Progress Notes (Signed)
[place] - [place] - [place]   Follow-up Note  Referring Provider: Crist Fat, MD Primary Provider: Leonia Kelly, Barbara Cower, MD Date of Office Visit: 03/15/2021  Subjective:   Jenny Kelly (DOB: 11-05-1945) is a 75 y.o. 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  Her last visit to this clinic was 18 January 2021.  She has had significant improvement regarding her cough.  She has had a rather dramatic decrease in coughing and she does not have any gagging or retching or post tussive mucturation at this point.  As well her throat is really doing a lot better and she does not have constant throat clearing and a coating in her throat.  She has had no problems with her nose.  Allergies as of 03/15/2021       Reactions   Ciprofloxacin Hives   Ask patient   Prochlorperazine Edisylate Other (See Comments)   Cant swallow Muscle cramping   Prednisone    Reduced Iso-alpha Acids Complex    Statins Other (See Comments)   Muscle weakness   Sulfa Antibiotics Swelling   Azithromycin Rash   Latex Rash        Medication List    albuterol 108 (90 Base) MCG/ACT inhaler Commonly known as: Proventil HFA Inhale two puffs every 4-6 hours if needed for coughing, wheezing, or shortness of breath.   amiodarone 100 MG tablet Commonly known as: PACERONE Take 50 mg by mouth daily.   azelastine 0.1 % nasal spray Commonly known as: ASTELIN Use one spray in each nostril twice daily   budesonide 0.5 MG/2ML nebulizer solution Commonly known as: PULMICORT Take 2 mLs (0.5 mg total) by nebulization in the morning and at bedtime.   donepezil 10 MG tablet Commonly known as: ARICEPT Take 10 mg by mouth at bedtime.   Erenumab-aooe 70 MG/ML Soaj Inject into the  skin. Inject into the skin every 30 days   ergocalciferol 1.25 MG (50000 UT) capsule Commonly known as: VITAMIN D2 Take 50,000 Units by mouth See admin instructions. Takes twice a week on Wednesday and Saturday   famotidine 40 MG tablet Commonly known as: PEPCID Take one tablet once daily   fluticasone 50 MCG/ACT nasal spray Commonly known as: FLONASE SPRAY 1 SPRAY INTO BOTH NOSTRILS DAILY.   ipratropium 0.03 % nasal spray Commonly known as: ATROVENT Place 2 sprays into both nostrils every 12 (twelve) hours.   levothyroxine 88 MCG tablet Commonly known as: SYNTHROID Take 88 mcg by mouth daily before breakfast.   omeprazole 40 MG capsule Commonly known as: PRILOSEC   pregabalin 50 MG capsule Commonly known as: LYRICA Take 50 mg by mouth 2 (two) times daily.   rivaroxaban 20 MG Tabs tablet Commonly known as: XARELTO Take 20 mg by mouth daily with supper.   topiramate 200 MG tablet Commonly known as: TOPAMAX Take 200 mg by mouth 2 (two) times daily.   Trelegy Ellipta 100-62.5-25 MCG/ACT Aepb Generic drug: Fluticasone-Umeclidin-Vilant Inhale 1 puff into the lungs daily.    Past Medical History:  Diagnosis Date   Allergy    Anemia    Anxiety    Arthritis    Asthma    Atrial fibrillation (HCC)    Blood transfusion without reported diagnosis    Cataract  CHF (congestive heart failure) (HCC)    Chronic kidney disease    Clotting disorder (HCC)    COPD (chronic obstructive pulmonary disease) (HCC)    Depression    Diabetes mellitus without complication (HCC)    GERD (gastroesophageal reflux disease)    Heart murmur    Hyperlipidemia    Myocardial infarction (HCC)    Neuromuscular disorder (HCC)    tremors   Seizures (HCC)    Sleep apnea    Stroke (HCC)    Thyroid disease     Past Surgical History:  Procedure Laterality Date   ABDOMINAL HYSTERECTOMY     APPENDECTOMY     bladder tack     CHOLECYSTECTOMY     CORONARY ARTERY BYPASS GRAFT      ESOPHAGOGASTRODUODENOSCOPY ENDOSCOPY     EYE SURGERY     FRACTURE SURGERY Right    3rd finger   JOINT REPLACEMENT Bilateral    knees   TONSILLECTOMY AND ADENOIDECTOMY     TUBAL LIGATION      Review of systems negative except as noted in HPI / PMHx or noted below:  Review of Systems  Constitutional: Negative.   HENT: Negative.    Eyes: Negative.   Respiratory: Negative.    Cardiovascular: Negative.   Gastrointestinal: Negative.   Genitourinary: Negative.   Musculoskeletal: Negative.   Skin: Negative.   Neurological: Negative.   Endo/Heme/Allergies: Negative.   Psychiatric/Behavioral: Negative.      Objective:   Vitals:   03/15/21 1502  BP: 110/60  Pulse: 75  Resp: 16  SpO2: 94%          Physical Exam Constitutional:      Appearance: She is not diaphoretic.  HENT:     Head: Normocephalic.     Right Ear: Tympanic membrane, ear canal and external ear normal.     Left Ear: Tympanic membrane, ear canal and external ear normal.     Nose: Nose normal. No mucosal edema or rhinorrhea.     Mouth/Throat:     Pharynx: Uvula midline. No oropharyngeal exudate.  Eyes:     Conjunctiva/sclera: Conjunctivae normal.  Neck:     Thyroid: No thyromegaly.     Trachea: Trachea normal. No tracheal tenderness or tracheal deviation.  Cardiovascular:     Rate and Rhythm: Normal rate and regular rhythm.     Heart sounds: Normal heart sounds, S1 normal and S2 normal. No murmur heard. Pulmonary:     Effort: No respiratory distress.     Breath sounds: Normal breath sounds. No stridor. No wheezing (Bilateral inspiratory and expiratory rhonchi and wheezing and crackles throughout all lung fields.) or rales.  Lymphadenopathy:     Head:     Right side of head: No tonsillar adenopathy.     Left side of head: No tonsillar adenopathy.     Cervical: No cervical adenopathy.  Skin:    Findings: No erythema or rash.     Nails: There is no clubbing.  Neurological:     Mental Status: She is  alert.    Diagnostics:    Spirometry was performed and demonstrated an FEV1 of 0.31 at 44 % of predicted.  Results of blood tests obtained 17 February 2021 identifies WBC 11.5, absolute eosinophils 600, absolute lymphocyte 3300, hemoglobin 11.4, platelet 258,  Assessment and Plan:   1. COPD with asthma (HCC)   2. Perennial allergic rhinitis   3. LPRD (laryngopharyngeal reflux disease)   4. Other eosinophilia     1.  Continue to treat and prevent inflammation:  A. Budesonide 0.5 - nebulize 2 times per day  B. Fluticasone - 1 spray each nostril 2 times per day C. Atrovent (Ipratropium 0.06%) - 1 spray each nostril 2 times per day D. Azelastine - 1 spray each nostril 2 times per day  2.  Continue to treat and prevent reflux/LPR:  A. Omeprazole 40 mg - 1 tablet 2 times per day B. Famotidine 40 mg - 1 tablet in morning  3.  If needed:  A. Albuterol HFA - 2 inhalations every 4-6 hours B. Loratadine 10 mg - 1 tablet 1-2 times per day  4.  Review of all previous blood tests performed by Dr. Leonia Kelly  4. Return to clinic in 12 weeks or earlier if problem.   Marcelene appears to be doing better at this point in time on her current therapy and I would like for her to remain on her current treatment which includes anti-inflammatory medications for her airway and therapy directed against reflux and I will see her back in this clinic in 12 weeks or earlier if there is a problem.  I will obtain all of the blood test that I ordered during her last visit from Dr. Ammie Dalton office.  She does have some degree of eosinophilia and I am waiting to see her area two aero allergen profile and also her alpha-1 antitrypsin level and phenotype.  Laurette Schimke, MD Allergy / Immunology [facility]

## 2021-03-15 NOTE — Patient Instructions (Addendum)
  1.  Continue to treat and prevent inflammation:  A. Budesonide 0.5 - nebulize 2 times per day  B. Fluticasone - 1 spray each nostril 2 times per day C. Atrovent (Ipratropium 0.06%) - 1 spray each nostril 2 times per day D. Azelastine - 1 spray each nostril 2 times per day  2.  Continue to treat and prevent reflux/LPR:  A. Omeprazole 40 mg - 1 tablet 2 times per day B. Famotidine 40 mg - 1 tablet in morning  3.  If needed:  A. Albuterol HFA - 2 inhalations every 4-6 hours B. Loratadine 10 mg - 1 tablet 1-2 times per day  4.  Review of all previous blood tests performed by [name]  4. Return to clinic in 12 weeks or earlier if problem.

## 2021-03-19 DIAGNOSIS — J80 Acute respiratory distress syndrome: Secondary | ICD-10-CM | POA: Diagnosis not present

## 2021-03-19 DIAGNOSIS — A413 Sepsis due to Hemophilus influenzae: Secondary | ICD-10-CM | POA: Diagnosis not present

## 2021-03-19 DIAGNOSIS — I491 Atrial premature depolarization: Secondary | ICD-10-CM | POA: Diagnosis not present

## 2021-03-19 DIAGNOSIS — J439 Emphysema, unspecified: Secondary | ICD-10-CM | POA: Diagnosis not present

## 2021-03-19 DIAGNOSIS — R0689 Other abnormalities of breathing: Secondary | ICD-10-CM | POA: Diagnosis not present

## 2021-03-19 DIAGNOSIS — J14 Pneumonia due to Hemophilus influenzae: Secondary | ICD-10-CM | POA: Diagnosis not present

## 2021-03-19 DIAGNOSIS — E876 Hypokalemia: Secondary | ICD-10-CM | POA: Diagnosis not present

## 2021-03-19 DIAGNOSIS — R000 Tachycardia, unspecified: Secondary | ICD-10-CM | POA: Diagnosis not present

## 2021-03-19 DIAGNOSIS — R059 Cough, unspecified: Secondary | ICD-10-CM | POA: Diagnosis not present

## 2021-03-19 DIAGNOSIS — R0902 Hypoxemia: Secondary | ICD-10-CM | POA: Diagnosis not present

## 2021-03-19 DIAGNOSIS — I4891 Unspecified atrial fibrillation: Secondary | ICD-10-CM | POA: Diagnosis not present

## 2021-03-19 DIAGNOSIS — J9601 Acute respiratory failure with hypoxia: Secondary | ICD-10-CM | POA: Diagnosis not present

## 2021-03-19 DIAGNOSIS — J189 Pneumonia, unspecified organism: Secondary | ICD-10-CM | POA: Diagnosis not present

## 2021-03-19 DIAGNOSIS — R9431 Abnormal electrocardiogram [ECG] [EKG]: Secondary | ICD-10-CM | POA: Diagnosis not present

## 2021-03-19 DIAGNOSIS — R6520 Severe sepsis without septic shock: Secondary | ICD-10-CM | POA: Diagnosis not present

## 2021-03-19 DIAGNOSIS — I2510 Atherosclerotic heart disease of native coronary artery without angina pectoris: Secondary | ICD-10-CM | POA: Diagnosis not present

## 2021-03-19 DIAGNOSIS — R0602 Shortness of breath: Secondary | ICD-10-CM | POA: Diagnosis not present

## 2021-03-19 DIAGNOSIS — N179 Acute kidney failure, unspecified: Secondary | ICD-10-CM | POA: Diagnosis not present

## 2021-03-19 DIAGNOSIS — J440 Chronic obstructive pulmonary disease with acute lower respiratory infection: Secondary | ICD-10-CM | POA: Diagnosis not present

## 2021-03-20 ENCOUNTER — Encounter: Payer: Self-pay | Admitting: Allergy and Immunology

## 2021-03-24 DIAGNOSIS — J440 Chronic obstructive pulmonary disease with acute lower respiratory infection: Secondary | ICD-10-CM | POA: Diagnosis not present

## 2021-03-24 DIAGNOSIS — A419 Sepsis, unspecified organism: Secondary | ICD-10-CM | POA: Diagnosis not present

## 2021-03-24 DIAGNOSIS — J9601 Acute respiratory failure with hypoxia: Secondary | ICD-10-CM | POA: Diagnosis not present

## 2021-03-24 DIAGNOSIS — J14 Pneumonia due to Hemophilus influenzae: Secondary | ICD-10-CM | POA: Diagnosis not present

## 2021-03-31 DIAGNOSIS — D649 Anemia, unspecified: Secondary | ICD-10-CM | POA: Diagnosis not present

## 2021-03-31 DIAGNOSIS — J189 Pneumonia, unspecified organism: Secondary | ICD-10-CM | POA: Diagnosis not present

## 2021-04-10 ENCOUNTER — Other Ambulatory Visit: Payer: Self-pay | Admitting: Pulmonary Disease

## 2021-04-22 DIAGNOSIS — E876 Hypokalemia: Secondary | ICD-10-CM | POA: Diagnosis not present

## 2021-04-22 DIAGNOSIS — R197 Diarrhea, unspecified: Secondary | ICD-10-CM | POA: Diagnosis not present

## 2021-04-22 DIAGNOSIS — J14 Pneumonia due to Hemophilus influenzae: Secondary | ICD-10-CM | POA: Diagnosis not present

## 2021-04-22 DIAGNOSIS — J9601 Acute respiratory failure with hypoxia: Secondary | ICD-10-CM | POA: Diagnosis not present

## 2021-05-01 ENCOUNTER — Encounter (HOSPITAL_COMMUNITY)
Admission: EM | Disposition: A | Discharge status: Rehabilitation Facility | Payer: Self-pay | Source: Home / Self Care | Attending: Pulmonary Disease

## 2021-05-01 ENCOUNTER — Inpatient Hospital Stay (HOSPITAL_COMMUNITY)
Admission: EM | Admit: 2021-05-01 | Discharge: 2021-05-07 | DRG: 853 | Disposition: A | Discharge status: Rehabilitation Facility | Payer: Medicare HMO | Attending: Internal Medicine | Admitting: Internal Medicine

## 2021-05-01 ENCOUNTER — Emergency Department (HOSPITAL_COMMUNITY): Payer: Medicare HMO

## 2021-05-01 ENCOUNTER — Inpatient Hospital Stay (HOSPITAL_COMMUNITY): Payer: Medicare HMO | Admitting: Anesthesiology

## 2021-05-01 ENCOUNTER — Inpatient Hospital Stay (HOSPITAL_COMMUNITY): Payer: Medicare HMO

## 2021-05-01 ENCOUNTER — Other Ambulatory Visit (HOSPITAL_COMMUNITY): Payer: Medicare HMO

## 2021-05-01 ENCOUNTER — Encounter (HOSPITAL_COMMUNITY): Payer: Self-pay | Admitting: Neuroradiology

## 2021-05-01 DIAGNOSIS — R000 Tachycardia, unspecified: Secondary | ICD-10-CM

## 2021-05-01 DIAGNOSIS — I63311 Cerebral infarction due to thrombosis of right middle cerebral artery: Secondary | ICD-10-CM | POA: Diagnosis present

## 2021-05-01 DIAGNOSIS — N1831 Chronic kidney disease, stage 3a: Secondary | ICD-10-CM | POA: Diagnosis present

## 2021-05-01 DIAGNOSIS — J9601 Acute respiratory failure with hypoxia: Secondary | ICD-10-CM | POA: Diagnosis not present

## 2021-05-01 DIAGNOSIS — J439 Emphysema, unspecified: Secondary | ICD-10-CM | POA: Diagnosis present

## 2021-05-01 DIAGNOSIS — Z20822 Contact with and (suspected) exposure to covid-19: Secondary | ICD-10-CM | POA: Diagnosis present

## 2021-05-01 DIAGNOSIS — I6523 Occlusion and stenosis of bilateral carotid arteries: Secondary | ICD-10-CM | POA: Diagnosis not present

## 2021-05-01 DIAGNOSIS — J8410 Pulmonary fibrosis, unspecified: Secondary | ICD-10-CM | POA: Diagnosis not present

## 2021-05-01 DIAGNOSIS — R231 Pallor: Secondary | ICD-10-CM | POA: Diagnosis not present

## 2021-05-01 DIAGNOSIS — R0902 Hypoxemia: Secondary | ICD-10-CM | POA: Diagnosis not present

## 2021-05-01 DIAGNOSIS — R7881 Bacteremia: Secondary | ICD-10-CM | POA: Diagnosis not present

## 2021-05-01 DIAGNOSIS — E1122 Type 2 diabetes mellitus with diabetic chronic kidney disease: Secondary | ICD-10-CM | POA: Diagnosis present

## 2021-05-01 DIAGNOSIS — Z91040 Latex allergy status: Secondary | ICD-10-CM

## 2021-05-01 DIAGNOSIS — Z79890 Hormone replacement therapy (postmenopausal): Secondary | ICD-10-CM

## 2021-05-01 DIAGNOSIS — R0602 Shortness of breath: Secondary | ICD-10-CM | POA: Diagnosis not present

## 2021-05-01 DIAGNOSIS — Z9989 Dependence on other enabling machines and devices: Secondary | ICD-10-CM | POA: Diagnosis not present

## 2021-05-01 DIAGNOSIS — E039 Hypothyroidism, unspecified: Secondary | ICD-10-CM | POA: Diagnosis present

## 2021-05-01 DIAGNOSIS — R509 Fever, unspecified: Secondary | ICD-10-CM | POA: Diagnosis not present

## 2021-05-01 DIAGNOSIS — N136 Pyonephrosis: Secondary | ICD-10-CM | POA: Diagnosis present

## 2021-05-01 DIAGNOSIS — I6389 Other cerebral infarction: Secondary | ICD-10-CM | POA: Diagnosis not present

## 2021-05-01 DIAGNOSIS — I63511 Cerebral infarction due to unspecified occlusion or stenosis of right middle cerebral artery: Secondary | ICD-10-CM | POA: Diagnosis not present

## 2021-05-01 DIAGNOSIS — I272 Pulmonary hypertension, unspecified: Secondary | ICD-10-CM | POA: Diagnosis present

## 2021-05-01 DIAGNOSIS — I480 Paroxysmal atrial fibrillation: Secondary | ICD-10-CM | POA: Diagnosis not present

## 2021-05-01 DIAGNOSIS — R300 Dysuria: Secondary | ICD-10-CM | POA: Diagnosis not present

## 2021-05-01 DIAGNOSIS — D696 Thrombocytopenia, unspecified: Secondary | ICD-10-CM | POA: Diagnosis not present

## 2021-05-01 DIAGNOSIS — Z87891 Personal history of nicotine dependence: Secondary | ICD-10-CM

## 2021-05-01 DIAGNOSIS — D631 Anemia in chronic kidney disease: Secondary | ICD-10-CM | POA: Diagnosis not present

## 2021-05-01 DIAGNOSIS — I639 Cerebral infarction, unspecified: Secondary | ICD-10-CM | POA: Diagnosis not present

## 2021-05-01 DIAGNOSIS — A4151 Sepsis due to Escherichia coli [E. coli]: Principal | ICD-10-CM | POA: Diagnosis present

## 2021-05-01 DIAGNOSIS — J9690 Respiratory failure, unspecified, unspecified whether with hypoxia or hypercapnia: Secondary | ICD-10-CM | POA: Diagnosis not present

## 2021-05-01 DIAGNOSIS — Z951 Presence of aortocoronary bypass graft: Secondary | ICD-10-CM

## 2021-05-01 DIAGNOSIS — I69398 Other sequelae of cerebral infarction: Secondary | ICD-10-CM | POA: Diagnosis not present

## 2021-05-01 DIAGNOSIS — I10 Essential (primary) hypertension: Secondary | ICD-10-CM | POA: Diagnosis not present

## 2021-05-01 DIAGNOSIS — G4733 Obstructive sleep apnea (adult) (pediatric): Secondary | ICD-10-CM | POA: Diagnosis not present

## 2021-05-01 DIAGNOSIS — N201 Calculus of ureter: Secondary | ICD-10-CM | POA: Diagnosis not present

## 2021-05-01 DIAGNOSIS — Z95828 Presence of other vascular implants and grafts: Secondary | ICD-10-CM

## 2021-05-01 DIAGNOSIS — I130 Hypertensive heart and chronic kidney disease with heart failure and stage 1 through stage 4 chronic kidney disease, or unspecified chronic kidney disease: Secondary | ICD-10-CM | POA: Diagnosis not present

## 2021-05-01 DIAGNOSIS — I6603 Occlusion and stenosis of bilateral middle cerebral arteries: Secondary | ICD-10-CM | POA: Diagnosis not present

## 2021-05-01 DIAGNOSIS — Z8673 Personal history of transient ischemic attack (TIA), and cerebral infarction without residual deficits: Secondary | ICD-10-CM | POA: Diagnosis not present

## 2021-05-01 DIAGNOSIS — E875 Hyperkalemia: Secondary | ICD-10-CM | POA: Diagnosis present

## 2021-05-01 DIAGNOSIS — I252 Old myocardial infarction: Secondary | ICD-10-CM

## 2021-05-01 DIAGNOSIS — I700 Atherosclerosis of aorta: Secondary | ICD-10-CM | POA: Diagnosis not present

## 2021-05-01 DIAGNOSIS — R001 Bradycardia, unspecified: Secondary | ICD-10-CM

## 2021-05-01 DIAGNOSIS — K5900 Constipation, unspecified: Secondary | ICD-10-CM | POA: Diagnosis present

## 2021-05-01 DIAGNOSIS — Z452 Encounter for adjustment and management of vascular access device: Secondary | ICD-10-CM | POA: Diagnosis not present

## 2021-05-01 DIAGNOSIS — Z882 Allergy status to sulfonamides status: Secondary | ICD-10-CM

## 2021-05-01 DIAGNOSIS — Z7951 Long term (current) use of inhaled steroids: Secondary | ICD-10-CM

## 2021-05-01 DIAGNOSIS — D7389 Other diseases of spleen: Secondary | ICD-10-CM | POA: Diagnosis not present

## 2021-05-01 DIAGNOSIS — G8104 Flaccid hemiplegia affecting left nondominant side: Secondary | ICD-10-CM | POA: Diagnosis present

## 2021-05-01 DIAGNOSIS — E785 Hyperlipidemia, unspecified: Secondary | ICD-10-CM | POA: Diagnosis present

## 2021-05-01 DIAGNOSIS — R0689 Other abnormalities of breathing: Secondary | ICD-10-CM | POA: Diagnosis not present

## 2021-05-01 DIAGNOSIS — N3001 Acute cystitis with hematuria: Secondary | ICD-10-CM | POA: Diagnosis not present

## 2021-05-01 DIAGNOSIS — R1310 Dysphagia, unspecified: Secondary | ICD-10-CM | POA: Diagnosis present

## 2021-05-01 DIAGNOSIS — A419 Sepsis, unspecified organism: Secondary | ICD-10-CM | POA: Diagnosis not present

## 2021-05-01 DIAGNOSIS — N132 Hydronephrosis with renal and ureteral calculous obstruction: Secondary | ICD-10-CM | POA: Diagnosis not present

## 2021-05-01 DIAGNOSIS — R29810 Facial weakness: Secondary | ICD-10-CM | POA: Diagnosis present

## 2021-05-01 DIAGNOSIS — Z955 Presence of coronary angioplasty implant and graft: Secondary | ICD-10-CM

## 2021-05-01 DIAGNOSIS — K219 Gastro-esophageal reflux disease without esophagitis: Secondary | ICD-10-CM | POA: Diagnosis present

## 2021-05-01 DIAGNOSIS — J449 Chronic obstructive pulmonary disease, unspecified: Secondary | ICD-10-CM | POA: Diagnosis not present

## 2021-05-01 DIAGNOSIS — Z79899 Other long term (current) drug therapy: Secondary | ICD-10-CM

## 2021-05-01 DIAGNOSIS — G40909 Epilepsy, unspecified, not intractable, without status epilepticus: Secondary | ICD-10-CM | POA: Diagnosis present

## 2021-05-01 DIAGNOSIS — I6501 Occlusion and stenosis of right vertebral artery: Secondary | ICD-10-CM | POA: Diagnosis not present

## 2021-05-01 DIAGNOSIS — R4701 Aphasia: Secondary | ICD-10-CM | POA: Diagnosis present

## 2021-05-01 DIAGNOSIS — R6521 Severe sepsis with septic shock: Secondary | ICD-10-CM | POA: Diagnosis not present

## 2021-05-01 DIAGNOSIS — I472 Ventricular tachycardia, unspecified: Secondary | ICD-10-CM | POA: Diagnosis not present

## 2021-05-01 DIAGNOSIS — Z7901 Long term (current) use of anticoagulants: Secondary | ICD-10-CM | POA: Diagnosis not present

## 2021-05-01 DIAGNOSIS — M1990 Unspecified osteoarthritis, unspecified site: Secondary | ICD-10-CM | POA: Diagnosis present

## 2021-05-01 DIAGNOSIS — U099 Post covid-19 condition, unspecified: Secondary | ICD-10-CM | POA: Diagnosis present

## 2021-05-01 DIAGNOSIS — Z881 Allergy status to other antibiotic agents status: Secondary | ICD-10-CM

## 2021-05-01 DIAGNOSIS — R6520 Severe sepsis without septic shock: Secondary | ICD-10-CM | POA: Diagnosis not present

## 2021-05-01 DIAGNOSIS — I5032 Chronic diastolic (congestive) heart failure: Secondary | ICD-10-CM | POA: Diagnosis not present

## 2021-05-01 DIAGNOSIS — I69391 Dysphagia following cerebral infarction: Secondary | ICD-10-CM | POA: Diagnosis not present

## 2021-05-01 DIAGNOSIS — I69354 Hemiplegia and hemiparesis following cerebral infarction affecting left non-dominant side: Secondary | ICD-10-CM | POA: Diagnosis not present

## 2021-05-01 DIAGNOSIS — R2689 Other abnormalities of gait and mobility: Secondary | ICD-10-CM | POA: Diagnosis not present

## 2021-05-01 DIAGNOSIS — J80 Acute respiratory distress syndrome: Secondary | ICD-10-CM | POA: Diagnosis not present

## 2021-05-01 DIAGNOSIS — I959 Hypotension, unspecified: Secondary | ICD-10-CM | POA: Diagnosis not present

## 2021-05-01 DIAGNOSIS — Z86711 Personal history of pulmonary embolism: Secondary | ICD-10-CM

## 2021-05-01 DIAGNOSIS — Z880 Allergy status to penicillin: Secondary | ICD-10-CM

## 2021-05-01 DIAGNOSIS — Z888 Allergy status to other drugs, medicaments and biological substances status: Secondary | ICD-10-CM

## 2021-05-01 DIAGNOSIS — I2510 Atherosclerotic heart disease of native coronary artery without angina pectoris: Secondary | ICD-10-CM | POA: Diagnosis present

## 2021-05-01 DIAGNOSIS — N179 Acute kidney failure, unspecified: Secondary | ICD-10-CM | POA: Diagnosis not present

## 2021-05-01 DIAGNOSIS — R531 Weakness: Secondary | ICD-10-CM | POA: Diagnosis not present

## 2021-05-01 DIAGNOSIS — M47816 Spondylosis without myelopathy or radiculopathy, lumbar region: Secondary | ICD-10-CM | POA: Diagnosis not present

## 2021-05-01 DIAGNOSIS — J811 Chronic pulmonary edema: Secondary | ICD-10-CM | POA: Diagnosis not present

## 2021-05-01 DIAGNOSIS — N390 Urinary tract infection, site not specified: Secondary | ICD-10-CM | POA: Diagnosis not present

## 2021-05-01 DIAGNOSIS — R29707 NIHSS score 7: Secondary | ICD-10-CM | POA: Diagnosis present

## 2021-05-01 DIAGNOSIS — K6389 Other specified diseases of intestine: Secondary | ICD-10-CM | POA: Diagnosis not present

## 2021-05-01 DIAGNOSIS — Z96653 Presence of artificial knee joint, bilateral: Secondary | ICD-10-CM | POA: Diagnosis present

## 2021-05-01 DIAGNOSIS — R109 Unspecified abdominal pain: Secondary | ICD-10-CM | POA: Diagnosis not present

## 2021-05-01 HISTORY — PX: CYSTOSCOPY W/ URETERAL STENT PLACEMENT: SHX1429

## 2021-05-01 LAB — URINALYSIS, ROUTINE W REFLEX MICROSCOPIC
Bilirubin Urine: NEGATIVE
Glucose, UA: NEGATIVE mg/dL
Ketones, ur: NEGATIVE mg/dL
Nitrite: POSITIVE — AB
Protein, ur: NEGATIVE mg/dL
Specific Gravity, Urine: 1.01 (ref 1.005–1.030)
pH: 6 (ref 5.0–8.0)

## 2021-05-01 LAB — COMPREHENSIVE METABOLIC PANEL
ALT: 9 U/L (ref 0–44)
AST: 60 U/L — ABNORMAL HIGH (ref 15–41)
Albumin: 2.2 g/dL — ABNORMAL LOW (ref 3.5–5.0)
Alkaline Phosphatase: 74 U/L (ref 38–126)
Anion gap: 12 (ref 5–15)
BUN: 18 mg/dL (ref 8–23)
CO2: 17 mmol/L — ABNORMAL LOW (ref 22–32)
Calcium: 7.8 mg/dL — ABNORMAL LOW (ref 8.9–10.3)
Chloride: 107 mmol/L (ref 98–111)
Creatinine, Ser: 1.76 mg/dL — ABNORMAL HIGH (ref 0.44–1.00)
GFR, Estimated: 30 mL/min — ABNORMAL LOW (ref >60)
Glucose, Bld: 134 mg/dL — ABNORMAL HIGH (ref 70–99)
Potassium: 5.6 mmol/L — ABNORMAL HIGH (ref 3.5–5.1)
Sodium: 136 mmol/L (ref 135–145)
Total Bilirubin: 1.7 mg/dL — ABNORMAL HIGH (ref 0.3–1.2)
Total Protein: 5.7 g/dL — ABNORMAL LOW (ref 6.5–8.1)

## 2021-05-01 LAB — URINALYSIS, MICROSCOPIC (REFLEX)
Squamous Epithelial / HPF: NONE SEEN (ref 0–5)
WBC, UA: >50 WBC/hpf (ref 0–5)

## 2021-05-01 LAB — CBC
HCT: 42.2 % (ref 36.0–46.0)
Hemoglobin: 12.3 g/dL (ref 12.0–15.0)
MCH: 27.5 pg (ref 26.0–34.0)
MCHC: 29.1 g/dL — ABNORMAL LOW (ref 30.0–36.0)
MCV: 94.4 fL (ref 80.0–100.0)
Platelets: 133 10*3/uL — ABNORMAL LOW (ref 150–400)
RBC: 4.47 MIL/uL (ref 3.87–5.11)
RDW: 19.9 % — ABNORMAL HIGH (ref 11.5–15.5)
WBC: 7.2 10*3/uL (ref 4.0–10.5)
nRBC: 0 % (ref 0.0–0.2)

## 2021-05-01 LAB — BRAIN NATRIURETIC PEPTIDE: B Natriuretic Peptide: 493.7 pg/mL — ABNORMAL HIGH (ref 0.0–100.0)

## 2021-05-01 LAB — RESP PANEL BY RT-PCR (FLU A&B, COVID) ARPGX2
Influenza A by PCR: NEGATIVE
Influenza B by PCR: NEGATIVE
SARS Coronavirus 2 by RT PCR: NEGATIVE

## 2021-05-01 LAB — TROPONIN I (HIGH SENSITIVITY): Troponin I (High Sensitivity): 24 ng/L — ABNORMAL HIGH (ref <18)

## 2021-05-01 LAB — LACTIC ACID, PLASMA: Lactic Acid, Venous: 6.7 mmol/L (ref 0.5–1.9)

## 2021-05-01 LAB — GLUCOSE, CAPILLARY: Glucose-Capillary: 124 mg/dL — ABNORMAL HIGH (ref 70–99)

## 2021-05-01 SURGERY — CYSTOSCOPY, WITH RETROGRADE PYELOGRAM AND URETERAL STENT INSERTION
Anesthesia: Monitor Anesthesia Care | Site: Bladder | Laterality: Left

## 2021-05-01 SURGERY — IR WITH ANESTHESIA
Anesthesia: General

## 2021-05-01 MED ORDER — PROPOFOL 10 MG/ML IV BOLUS
INTRAVENOUS | Status: DC | PRN
Start: 2021-05-01 — End: 2021-05-01
  Administered 2021-05-01: 10 mg via INTRAVENOUS

## 2021-05-01 MED ORDER — ORAL CARE MOUTH RINSE: 15.0000 mL | Freq: Once | OROMUCOSAL | Status: DC

## 2021-05-01 MED ORDER — PIPERACILLIN-TAZOBACTAM 3.375 G IVPB
3.3750 g | Freq: Three times a day (TID) | INTRAVENOUS | Status: DC
  Administered 2021-05-01 – 2021-05-02 (×3): 3.375 g via INTRAVENOUS
  Filled 2021-05-01 (×5): qty 50

## 2021-05-01 MED ORDER — DONEPEZIL HCL 10 MG PO TABS
10.0000 mg | ORAL_TABLET | Freq: Every day | ORAL | Status: DC
  Administered 2021-05-02 – 2021-05-06 (×5): 10 mg via ORAL
  Filled 2021-05-01 (×5): qty 1

## 2021-05-01 MED ORDER — IOHEXOL 300 MG/ML  SOLN
INTRAMUSCULAR | Status: DC | PRN
  Administered 2021-05-01: 100 mL

## 2021-05-01 MED ORDER — LEVOTHYROXINE SODIUM 88 MCG PO TABS
88.0000 ug | ORAL_TABLET | Freq: Every day | ORAL | Status: DC
  Administered 2021-05-02 – 2021-05-03 (×2): 88 ug via ORAL
  Filled 2021-05-01 (×3): qty 1

## 2021-05-01 MED ORDER — LACTATED RINGERS IV SOLN: INTRAVENOUS | Status: DC

## 2021-05-01 MED ORDER — WATER FOR IRRIGATION, STERILE IR SOLN
Status: DC | PRN
  Administered 2021-05-01: 3000 mL

## 2021-05-01 MED ORDER — MONTELUKAST SODIUM 10 MG PO TABS
10.0000 mg | ORAL_TABLET | Freq: Every day | ORAL | Status: DC
  Administered 2021-05-02 – 2021-05-06 (×5): 10 mg via ORAL
  Filled 2021-05-01 (×6): qty 1

## 2021-05-01 MED ORDER — POLYETHYLENE GLYCOL 3350 17 G PO PACK: 17.0000 g | PACK | Freq: Every day | ORAL | Status: DC | PRN

## 2021-05-01 MED ORDER — CHLORHEXIDINE GLUCONATE CLOTH 2 % EX PADS
6.0000 | MEDICATED_PAD | Freq: Every day | CUTANEOUS | Status: DC
  Administered 2021-05-02 – 2021-05-06 (×5): 6 via TOPICAL

## 2021-05-01 MED ORDER — HEPARIN (PORCINE) 25000 UT/250ML-% IV SOLN
1450.0000 [IU]/h | INTRAVENOUS | Status: DC
  Administered 2021-05-01: 19:00:00 1000 [IU]/h via INTRAVENOUS
  Administered 2021-05-02 – 2021-05-03 (×2): 1050 [IU]/h via INTRAVENOUS
  Administered 2021-05-04: 1150 [IU]/h via INTRAVENOUS
  Administered 2021-05-05: 1450 [IU]/h via INTRAVENOUS
  Filled 2021-05-01 (×5): qty 250

## 2021-05-01 MED ORDER — CHLORHEXIDINE GLUCONATE 0.12 % MT SOLN: 15.0000 mL | Freq: Once | OROMUCOSAL | Status: DC

## 2021-05-01 MED ORDER — SODIUM POLYSTYRENE SULFONATE 15 GM/60ML PO SUSP
30.0000 g | Freq: Once | ORAL | Status: AC
  Administered 2021-05-02: 30 g via RECTAL
  Filled 2021-05-01: qty 120

## 2021-05-01 MED ORDER — DOCUSATE SODIUM 100 MG PO CAPS: 100.0000 mg | ORAL_CAPSULE | Freq: Two times a day (BID) | ORAL | Status: DC | PRN

## 2021-05-01 MED ORDER — AMIODARONE HCL 100 MG PO TABS
50.0000 mg | ORAL_TABLET | Freq: Every day | ORAL | Status: DC
  Filled 2021-05-01 (×2): qty 1

## 2021-05-01 MED ORDER — IOHEXOL 350 MG/ML SOLN
40.0000 mL | Freq: Once | INTRAVENOUS | Status: AC | PRN
  Administered 2021-05-01: 40 mL via INTRAVENOUS

## 2021-05-01 MED ORDER — LIDOCAINE HCL 1 % IJ SOLN
INTRAMUSCULAR | Status: AC
  Filled 2021-05-01: qty 20

## 2021-05-01 SURGICAL SUPPLY — 24 items
BAG DRN RND TRDRP ANRFLXCHMBR (UROLOGICAL SUPPLIES) ×1
BAG URINE DRAIN 2000ML AR STRL (UROLOGICAL SUPPLIES) ×2 IMPLANT
BAG URO CATCHER STRL LF (MISCELLANEOUS) ×2 IMPLANT
CATH FOLEY 2WAY SLVR  5CC 16FR (CATHETERS)
CATH FOLEY 2WAY SLVR 5CC 16FR (CATHETERS) IMPLANT
CATH URET 5FR 28IN OPEN ENDED (CATHETERS) ×2 IMPLANT
GLOVE SURG ENC MOIS LTX SZ7.5 (GLOVE) ×2 IMPLANT
GOWN STRL REUS W/ TWL LRG LVL3 (GOWN DISPOSABLE) ×1 IMPLANT
GOWN STRL REUS W/ TWL XL LVL3 (GOWN DISPOSABLE) ×1 IMPLANT
GOWN STRL REUS W/TWL LRG LVL3 (GOWN DISPOSABLE) ×2
GOWN STRL REUS W/TWL XL LVL3 (GOWN DISPOSABLE) ×2
GUIDEWIRE ANG ZIPWIRE 035X150 (WIRE) ×1 IMPLANT
GUIDEWIRE ANG ZIPWIRE 038X150 (WIRE) IMPLANT
GUIDEWIRE STR DUAL SENSOR (WIRE) ×2 IMPLANT
KIT TURNOVER KIT B (KITS) ×2 IMPLANT
MANIFOLD NEPTUNE II (INSTRUMENTS) IMPLANT
NS IRRIG 1000ML POUR BTL (IV SOLUTION) IMPLANT
PACK CYSTO (CUSTOM PROCEDURE TRAY) ×2 IMPLANT
STENT URET 6FRX24 CONTOUR (STENTS) ×1 IMPLANT
STENT URET 6FRX26 CONTOUR (STENTS) IMPLANT
SYPHON OMNI JUG (MISCELLANEOUS) ×2 IMPLANT
TOWEL GREEN STERILE FF (TOWEL DISPOSABLE) ×2 IMPLANT
TUBE CONNECTING 12X1/4 (SUCTIONS) IMPLANT
WATER STERILE IRR 3000ML UROMA (IV SOLUTION) ×2 IMPLANT

## 2021-05-01 NOTE — Consult Note (Addendum)
Urology Consult Note   Requesting Attending Physician:  de Sindy Messing, Wendelyn Breslow* Service Providing Consult: Urology   Reason for Consult:  Nephrolithiasis  HPI: Jenny Kelly is seen in consultation for reasons noted above at the request of [place], Wendelyn Breslow* for evaluation of ureteral stone and urosepsis.  Briefly is a 76 year old female with past medical history of heart failure, emphysema, diabetes, atrial fibrillation who was transferred to [facility] from [place] with concern for a right MCA stroke based off the change in her neurological status.  She was incidentally noted at the outside hospital to have a left 7 mm upper ureteral stone.  There is concern that she is septic she presented with a lactate of 6.7 she was initially tachycardic.  She is requiring oxygen and she is on BiPAP currently.  She has history of congestive heart failure and sleep apnea.  Chest x-ray revealed signs of interstitial lung disease.  From limited history that was obtained from the patient as well as from history obtained from her husband Mr. Hydie Southerly who is also her healthcare decision-maker while she is unable to make decisions for herself, she has had dysuria and urinary frequency since Friday.  She took Azo over-the-counter and felt better on Sunday.  This morning however she had a change in her neurologic status which prompted him bringing her to the hospital.  The patient states that she does have flank pain.  Past Medical History: Past Medical History:  Diagnosis Date   Allergy    Anemia    Anxiety    Arthritis    Asthma    Atrial fibrillation ([place])    Blood transfusion without reported diagnosis    Cataract    CHF (congestive heart failure) (HCC)    Chronic kidney disease    Clotting disorder (HCC)    COPD (chronic obstructive pulmonary disease) ([place])    Depression    Diabetes mellitus without complication (HCC)    GERD (gastroesophageal reflux disease)    Heart murmur     Hyperlipidemia    Myocardial infarction ([place])    Neuromuscular disorder ([place])    tremors   Seizures ([place])    Sleep apnea    Stroke ([place])    Thyroid disease     Past Surgical History:  Past Surgical History:  Procedure Laterality Date   ABDOMINAL HYSTERECTOMY     APPENDECTOMY     bladder tack     CHOLECYSTECTOMY     CORONARY ARTERY BYPASS GRAFT     ESOPHAGOGASTRODUODENOSCOPY ENDOSCOPY     EYE SURGERY     FRACTURE SURGERY Right    3rd finger   JOINT REPLACEMENT Bilateral    knees   TONSILLECTOMY AND ADENOIDECTOMY     TUBAL LIGATION      Medication: Current Facility-Administered Medications  Medication Dose Route Frequency Provider Last Rate Last Admin   [START ON 05/02/2021] amiodarone (PACERONE) tablet 50 mg  50 mg Oral Daily Desai, Rahul P, PA-C       docusate sodium (COLACE) capsule 100 mg  100 mg Oral BID PRN Shearon Stalls, Rahul P, PA-C       [START ON 05/02/2021] donepezil (ARICEPT) tablet 10 mg  10 mg Oral QHS Desai, Rahul P, PA-C       heparin ADULT infusion 100 units/mL (25000 units/247mL)  1,000 Units/hr Intravenous Continuous Heloise Purpura, RPH       [START ON 05/02/2021] levothyroxine (SYNTHROID) tablet 88 mcg  88 mcg Oral Q0600 Desai, Rahul P,  PA-C       lidocaine (XYLOCAINE) 1 % (with pres) injection            [START ON 05/02/2021] montelukast (SINGULAIR) tablet 10 mg  10 mg Oral QHS Desai, Rahul P, PA-C       piperacillin-tazobactam (ZOSYN) IVPB 3.375 g  3.375 g Intravenous Q8H Desai, Rahul P, PA-C       polyethylene glycol (MIRALAX / GLYCOLAX) packet 17 g  17 g Oral Daily PRN Desai, Rahul P, PA-C       sodium polystyrene (KAYEXALATE) 15 GM/60ML suspension 30 g  30 g Rectal Once Desai, Rahul P, PA-C        Allergies: Allergies  Allergen Reactions   Ciprofloxacin Hives    Ask patient   Prochlorperazine Edisylate Other (See Comments)    Cant swallow Muscle cramping   Prednisone    Reduced Iso-Alpha Acids Complex    Statins Other (See Comments)    Muscle  weakness    Sulfa Antibiotics Swelling   Azithromycin Rash   Latex Rash    Social History: Social History   Tobacco Use   Smoking status: Former    Packs/day: 0.50    Years: 30.00    Pack years: 15.00    Types: Cigarettes    Quit date: 06/26/2000    Years since quitting: 20.8   Smokeless tobacco: Never  Vaping Use   Vaping Use: Former  Substance Use Topics   Alcohol use: Not Currently   Drug use: Never    Family History Family History  Family history unknown: Yes    Review of Systems 10 systems were reviewed and are negative except as noted specifically in the HPI.  Objective   Vital signs in last 24 hours: BP (!) 96/56    Pulse (!) 102    Temp 98 F (36.7 C) (Axillary)    Resp (!) 24    Ht 5\' 1"  (1.549 m)    Wt 63 kg    SpO2 99%    BMI 26.24 kg/m   Physical Exam General: Patient is on BiPAP, answers questions in yes or no fashion. HEENT: [place]/AT, EOMI, MMM Pulmonary: Breathing on BiPAP Cardiovascular: Well perfused currently Abdomen: Soft, tender to palpation generalized left greater than right. GU: Awaiting Foley catheter placement, voiding spontaneously currently, has significant left CVA tenderness. Extremities: warm and well perfused Neuro: Answers questions in yes or no fashion.  I did not perform a complete neurologic examination.  Most Recent Labs: Lab Results  Component Value Date   WBC 7.2 05/01/2021   HGB 12.3 05/01/2021   HCT 42.2 05/01/2021   PLT 133 (L) 05/01/2021    Lab Results  Component Value Date   NA 136 05/01/2021   K 5.6 (H) 05/01/2021   CL 107 05/01/2021   CO2 17 (L) 05/01/2021   BUN 18 05/01/2021   CREATININE 1.76 (H) 05/01/2021   CALCIUM 7.8 (L) 05/01/2021    No results found for: INR, APTT   Urine Culture: @[id number] (laburin,org,[id number],[id number])@   IMAGING: CT CEREBRAL PERFUSION W CONTRAST  Result Date: 05/01/2021 EXAM: CT PERFUSION BRAIN TECHNIQUE: Multiphase CT imaging of the brain was performed following IV bolus  contrast injection. Subsequent parametric perfusion maps were calculated using RAPID software. CONTRAST:  74mL OMNIPAQUE IOHEXOL 350 MG/ML SOLN COMPARISON:  None. FINDINGS: CT Brain Perfusion Findings: CBF (<30%) Volume: 9mL Perfusion (Tmax>6.0s) volume: 55mL Mismatch Volume: 34mL ASPECTS on noncontrast CT Head: 10 at 1:43 p.m. today. Infarct Core: 13 mL Infarction Location:Right MCA  territory, in the right posterior temporal, posterior frontal, and anterior parietal lobes. IMPRESSION: 13 mL infarct core, involving the right MCA territory in the right posterior temporal, posterior frontal, and anterior parietal lobes. Electronically Signed   By: Merilyn Baba M.D.   On: 05/01/2021 16:56   DG Chest Port 1 View  Result Date: 05/01/2021 CLINICAL DATA:  Shortness of breath EXAM: PORTABLE CHEST 1 VIEW COMPARISON:  06/20/2020 chest CT, radiograph 05/01/2021, 03/19/2021, 03/24/2020 FINDINGS: Diffuse bilateral reticular opacity consistent with chronic interstitial lung disease. No confluent acute airspace disease, pleural effusion or pneumothorax. Stable cardiomediastinal silhouette. Post sternotomy changes. Aortic atherosclerosis. IMPRESSION: Chronic interstitial lung disease without acute superimposed airspace disease Electronically Signed   By: Donavan Foil M.D.   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  She does have associated hydronephrosis and there is concern for obstructed UTI.  We discussed with her husband and healthcare decision-maker the indications for acute intervention including infected obstruction, bilateral ureteral obstruction or unilateral obstruction of solitary kidney as well as other less urgent indications for  decompression which would included intractable pain, N/V, and acute renal injury. We also discussed the possible inability to place a ureteral stent in which case, the next intervention recommended would be a percutaneous nephrostomy tube. Given obstructed infection and hemodynamic instability as well as lactic acidosis is present, plan for emergent left ureteral stent placement.  Recommendations: Plan for emergent l left ureteral stent placement Case posted; Urology will discuss with anesthesia Keep patient NPO Consent obtained from patient's husband and healthcare decision-maker Send urinalysis and urine culture at this time, prior to OR Continue broad-spectrum antibiotics tailored per her culture data. Patient will require a 14-day course of culture specific antibiotics for complicated UTI after which we will arrange for definitive stone management. Appreciate resuscitation from ICU team Place Foley for maximal urinary decompression   Thank you for this consult. Please contact the urology consult pager with any further questions/concerns.

## 2021-05-01 NOTE — ED Triage Notes (Signed)
BIB [place]. CODE stroke. Possible aspiration enroute. Arrives on CPAP. Follows commands. HX afib. Family present.

## 2021-05-01 NOTE — Op Note (Signed)
Operative Note  Preoperative diagnosis:  1.  Sepsis secondary to obstructing proximal 7 mm left ureteral stone  Postoperative diagnosis: 1.  Sepsis secondary obstructing proximal 7 mm left ureteral stone  Procedure(s): 1.  Cystoscopy with left ureteral stent placement 2.  Left retrograde pyelogram with intraoperative interpretation of fluoroscopic imaging  Surgeon: Rhoderick Moody, MD  Assistants:  Vallery Ridge, MD PGY4  Anesthesia:  General  Complications:  None  EBL: Less than 5 mL  Specimens: 1.  Urine for culture and sensitivity  Drains/Catheters: 1.  16 French Foley catheter 2.  6 French, 24 cm left JJ stent  Intraoperative findings:   Left retrograde pyelogram revealed a filling defect within the proximal aspects of the left ureter, consistent with the obstructing stone seen on recent cross-sectional imaging.  The proximal ureter, renal pelvis and its associated calyces were uniformly dilated with no additional filling defects.  The distal aspects of the ureter exhibited no filling defects.  Indication:  Jenny Kelly is a 76 y.o. female with sepsis secondary to an obstructing 7 mm proximal left ureteral calculus.  She has also been diagnosed with a CVA.  Her husband has been consented for the above procedures as this is an emergency and the patient is unable to be given informed consent.  Description of procedure:  After informed consent was obtained, the patient was brought to the operating room and general LMA anesthesia was administered. The patient was then placed in the dorsolithotomy position and prepped and draped in the usual sterile fashion. A timeout was performed. A 23 French rigid cystoscope was then inserted into the urethral meatus and advanced into the bladder under direct vision. A complete bladder survey revealed no intravesical pathology.  A 5 French ureteral catheter was then inserted into the left ureteral orifice and a retrograde pyelogram  was obtained, with the findings listed above.  A Glidewire was then used to intubate the lumen of the ureteral catheter and was advanced up to the left renal pelvis, under fluoroscopic guidance.  The catheter was then removed, leaving the wire in place.  A 6 French, 24 cm JJ stent was then advanced over the wire and into good position within the left collecting system, confirming placement via fluoroscopy.  A 16 French Foley catheter was placed and set to gravity drainage.  The patient was then awoken from anesthesia and transferred to the postanesthesia in stable condition.  Plan: Urine cultures are pending.  Continue broad-spectrum antibiotics.  She will need definitive stone treatment in the coming weeks pending her recovery from her CVA.

## 2021-05-01 NOTE — Progress Notes (Signed)
Patient transported to CT and back to room 29 without complication.

## 2021-05-01 NOTE — Progress Notes (Signed)
Patient transported from 4N23 to Short Stay on BIPAP with no complications noted.

## 2021-05-01 NOTE — Anesthesia Preprocedure Evaluation (Addendum)
Anesthesia Evaluation  Patient identified by MRN, date of birth, ID band Patient confused    Reviewed: Allergy & Precautions, NPO status , Patient's Chart, lab work & pertinent test results  Airway Mallampati: III  TM Distance: >3 FB Neck ROM: Full   Comment: On BIPAP Dental  (+) Dental Advisory Given   Pulmonary asthma , sleep apnea , COPD, former smoker,   On BiPAP   + rhonchi        Cardiovascular + CAD, + Past MI, + CABG and +CHF   Rhythm:Regular Rate:Normal     Neuro/Psych Seizures -,  CVA    GI/Hepatic Neg liver ROS, GERD  ,  Endo/Other  diabetesHypothyroidism   Renal/GU CRF and ARFRenal disease     Musculoskeletal  (+) Arthritis ,   Abdominal   Peds  Hematology negative hematology ROS (+)   Anesthesia Other Findings   Reproductive/Obstetrics                            Anesthesia Physical Anesthesia Plan  ASA: 4  Anesthesia Plan: MAC   Post-op Pain Management: Minimal or no pain anticipated   Induction:   PONV Risk Score and Plan: 2 and Propofol infusion  Airway Management Planned: Natural Airway  Additional Equipment:   Intra-op Plan:   Post-operative Plan:   Informed Consent: I have reviewed the patients History and Physical, chart, labs and discussed the procedure including the risks, benefits and alternatives for the proposed anesthesia with the patient or authorized representative who has indicated his/her understanding and acceptance.   Patient has DNR.  Discussed DNR with power of attorney and Continue DNR.     Plan Discussed with:   Anesthesia Plan Comments:        Anesthesia Quick Evaluation

## 2021-05-01 NOTE — Anesthesia Postprocedure Evaluation (Signed)
Anesthesia Post Note  Patient: Jenny Kelly  Procedure(s) Performed: CYSTOSCOPY WITH RETROGRADE PYELOGRAM/URETERAL STENT PLACEMENT (Left: Bladder)     Patient location during evaluation: PACU Anesthesia Type: MAC Level of consciousness: awake and alert Pain management: pain level controlled Vital Signs Assessment: post-procedure vital signs reviewed and stable Respiratory status: spontaneous breathing, nonlabored ventilation, respiratory function stable and patient connected to nasal cannula oxygen Cardiovascular status: stable and blood pressure returned to baseline Postop Assessment: no apparent nausea or vomiting Anesthetic complications: no   No notable events documented.  Last Vitals:  Vitals:   05/01/21 2125 05/01/21 2137  BP: (!) 82/57 (!) 86/52  Pulse:  88  Resp: (!) 22 (!) 23  Temp:    SpO2: 100% 93%    Last Pain:  Vitals:   05/01/21 2137  TempSrc:   PainSc: 0-No pain                 Tiajuana Amass

## 2021-05-01 NOTE — Consult Note (Signed)
NEURO HOSPITALIST CONSULT NOTE   Requesting physician: Dr. Ashok Cordia  Reason for Consult: Acute left hemiplegia  History obtained from:  Patient's husband and Chart     HPI:                                                                                                                                          Jenny Kelly is a 76 y.o. female with a history of atrial fibrillation (on Xarelto with last dose at 1 PM yesterday) presenting as an emergent transfer from [facility] for further evaluation of acute left hemiplegia.   Patient's husband reports Jenny Kelly had dysuria on Friday, and she took AZO OTC, with resolution of sx on Sunday (1/8). This morning, patient was up at 8 AM, ambulated to the restroom without issue. Husband reports her urine smelled "awful." At 9:30 AM, he was unable to get her to help with getting dressed; she was lethargic and weak. He did not notice if the weakness was unilateral or generalized. He then called EMS, who took her to [facility]. Around noon while at [place], patient was with nonsensical speech and a L facial droop. Teleneurology evaluated the patient. She was not a candidate for TNK due to anticoagulation. CTA of head and neck revealed an acute right M1 occlusion and severe stenosis of the right M2 more distally. The patient was emergently transported to [facility] for CTP and possible mechanical thrombectomy.   He reports that she had an MI in 2002 with stent placement c/b pulmonary emboli. She had a brain hemorrhage in 2009 where her only preceding sx was severe HA, no residual sx.  In 2015, she had triple bypass cardiac surgery. April 2021, COVID-19 infection left her with pulmonary fibrosis.   Past Medical History:  Diagnosis Date   Allergy    Anemia    Anxiety    Arthritis    Asthma    Atrial fibrillation ([place])    Blood transfusion without reported diagnosis    Cataract    CHF (congestive heart failure) ([place])    Chronic kidney disease     Clotting disorder (HCC)    COPD (chronic obstructive pulmonary disease) ([place])    Depression    Diabetes mellitus without complication (HCC)    GERD (gastroesophageal reflux disease)    Heart murmur    Hyperlipidemia    Myocardial infarction ([place])    Neuromuscular disorder ([place])    tremors   Seizures ([place])    Sleep apnea    Stroke ([place])    Thyroid disease     Past Surgical History:  Procedure Laterality Date   ABDOMINAL HYSTERECTOMY     APPENDECTOMY     bladder tack     CHOLECYSTECTOMY     CORONARY ARTERY BYPASS GRAFT  ESOPHAGOGASTRODUODENOSCOPY ENDOSCOPY     EYE SURGERY     FRACTURE SURGERY Right    3rd finger   JOINT REPLACEMENT Bilateral    knees   TONSILLECTOMY AND ADENOIDECTOMY     TUBAL LIGATION      Family History  Family history unknown: Yes            Social History:  reports that she quit smoking about 20 years ago. Her smoking use included cigarettes. She has a 15.00 pack-year smoking history. She has never used smokeless tobacco. She reports that she does not currently use alcohol. She reports that she does not use drugs.  Allergies  Allergen Reactions   Ciprofloxacin Hives    Ask patient   Prochlorperazine Edisylate Other (See Comments)    Cant swallow Muscle cramping   Prednisone    Reduced Iso-Alpha Acids Complex    Statins Other (See Comments)    Muscle weakness    Sulfa Antibiotics Swelling   Azithromycin Rash   Latex Rash    HOME MEDICATIONS:                                                                                                                      No current facility-administered medications on file prior to encounter.   Current Outpatient Medications on File Prior to Encounter  Medication Sig Dispense Refill   amiodarone (PACERONE) 100 MG tablet Take 50 mg by mouth at bedtime.     Cyanocobalamin (B-12 PO) Take by mouth.     donepezil (ARICEPT) 10 MG tablet Take 10 mg by mouth at bedtime.     ergocalciferol (VITAMIN D2)  1.25 MG (50000 UT) capsule Take 50,000 Units by mouth See admin instructions. Takes twice a week on Wednesday and Saturday     guaiFENesin (MUCINEX PO) Take 1 tablet by mouth as needed (cough, congestion).     levothyroxine (SYNTHROID) 88 MCG tablet Take 88 mcg by mouth daily before breakfast.     montelukast (SINGULAIR) 10 MG tablet Take 10 mg by mouth at bedtime.     omeprazole (PRILOSEC) 40 MG capsule Take 40 mg by mouth daily.     Phenazopyridine HCl (URISTAT PO) Take 1 tablet by mouth as needed (UTI).     pregabalin (LYRICA) 50 MG capsule Take 50 mg by mouth 2 (two) times daily.     rivaroxaban (XARELTO) 20 MG TABS tablet Take 20 mg by mouth every evening.     topiramate (TOPAMAX) 200 MG tablet Take 200 mg by mouth 2 (two) times daily.     albuterol (PROVENTIL HFA) 108 (90 Base) MCG/ACT inhaler Inhale two puffs every 4-6 hours if needed for coughing, wheezing, or shortness of breath. (Patient not taking: Reported on 05/01/2021) 1 each 1   azelastine (ASTELIN) 0.1 % nasal spray Use one spray in each nostril twice daily (Patient not taking: Reported on 05/01/2021) 30 mL 5   budesonide (PULMICORT) 0.5 MG/2ML nebulizer solution Take 2 mLs (0.5 mg total) by nebulization in  the morning and at bedtime. (Patient not taking: Reported on 05/01/2021) 120 mL 5   famotidine (PEPCID) 40 MG tablet Take one tablet once daily (Patient not taking: Reported on 05/01/2021) 30 tablet 5   fluticasone (FLONASE) 50 MCG/ACT nasal spray SPRAY 1 SPRAY INTO BOTH NOSTRILS DAILY. (Patient not taking: Reported on 05/01/2021) 48 mL 2   Fluticasone-Umeclidin-Vilant (TRELEGY ELLIPTA) 100-62.5-25 MCG/INH AEPB Inhale 1 puff into the lungs daily. (Patient not taking: Reported on 05/01/2021) 28 each 6   ipratropium (ATROVENT) 0.03 % nasal spray SPRAY 2 SPRAYS INTO BOTH NOSTRILS EVERY 12 HOURS. (Patient not taking: Reported on 05/01/2021) 90 mL 3     ROS:                                                                                                                                        Unable to obtain due to AMS.    There were no vitals taken for this visit.   General Examination:                                                                                                       Physical Exam  HEENT-  [place]/AT   Lungs- On CPAP with gurgling respirations Extremities- Warm and well perfused  Neurological Examination Mental Status: Awake with left sided neglect. Unable to speak due to CPAP in place. Will follow basic motor commands.  Cranial Nerves: II: PERRL. No blink to threat on the left. Intact blink to threat on the right.   III,IV, VI: No ptosis. Rightward gaze deviation. Does not cross midline to the left. No nystagmus.  V: Reacts less to left sided stimuli relative to the right.  VII: Unable to assess for facial droop due to CPAP in place VIII: Hearing intact to basic commands IX,X: Unable to assess XI: Head is rotated to the right; does not rotate to the left when presented with left-sided stimuli XII: Unable to assess tongue extension.  Motor: RUE and RLE 5/5 with normal movement and resistance to commands.  LUE No spontaneous movements. 0-1/5 in response to pinch LLE No spontaneous movements. 0-1/5 in response to plantar stimulation.  Sensory: Will grimace and move right side in response to left sided noxious stimuli Deep Tendon Reflexes: No definite asymmetry. Toes equivocal.  Cerebellar/Gait: Unable to assess    Lab Results: Basic Metabolic Panel: No results for input(s): NA, K, CL, CO2, GLUCOSE, BUN, CREATININE, CALCIUM, MG, PHOS in the last 168 hours.  CBC:  No results for input(s): WBC, NEUTROABS, HGB, HCT, MCV, PLT in the last 168 hours.  Cardiac Enzymes: No results for input(s): CKTOTAL, CKMB, CKMBINDEX, TROPONINI in the last 168 hours.  Lipid Panel: No results for input(s): CHOL, TRIG, HDL, CHOLHDL, VLDL, LDLCALC in the last 168 hours.  Imaging: No results found.  Assessment: 76 y.o. female  with a history of atrial fibrillation (on Xarelto with last dose at 1 PM yesterday) presenting as an emergent transfer from [facility] for further evaluation of acute left hemiplegia. This morning, patient was up at 8 AM, ambulated to the restroom without issue. Subsequently, at 9:30 AM, husband was unable to get her to help with getting dressed; she was lethargic and weak. She listed to the left when he tried to get her into a wheelchair. He then called EMS, who took her to [facility]. Around noon while at [place], patient was with nonsensical speech and a L facial droop. Teleneurology evaluated the patient. She was not a candidate for TNK due to anticoagulation. CTA of head and neck revealed an acute right M1 occlusion and severe stenosis of the right M2 more distally. The patient was emergently transported to [facility] for CTP and possible mechanical thrombectomy.  1. Exam reveals left hemiplegia and left hemineglect.  2. CTP reveals a 13 mL infarct core, involving the right MCA territory in the right posterior temporal, posterior frontal, and anterior parietal lobes.. Perfusion (Tmax>6.0s) with volume of 29 mL Mismatch Volume: 16 mL. 3. CTP discussed with Neurointerventional Radiologist. We are in consensus that the penumbra is too small to provide a strong indication for mechanical thrombectomy from a risks/benefits standpoint.  4. Stroke risk factors: Atrial fibrillation, CHF, CKD, clotting disorder, DM, HLD, sleep apnea and prior stroke   Recommendations: 1. HgbA1c, fasting lipid panel 2. MRI of the brain without contrast 3. PT consult, OT consult, Speech consult 4. Echocardiogram 5. Statin 6. Prophylactic therapy- Continue Xarelto:  7. Risk factor modification 8. Telemetry monitoring 9. Frequent neuro checks 10 NPO until passes stroke swallow screen    Electronically signed: Dr. Kerney Elbe 05/01/2021, 4:26 PM

## 2021-05-01 NOTE — ED Provider Notes (Signed)
[place] EMERGENCY DEPARTMENT Provider Note   CSN: [id number] Arrival date & time: 05/01/21  1545     History Previous intracranial hemorrhage, atrial fibrillation on chronic anticoagulation with Xarelto, hypertension, pulmonary hypertension, reflux. Chief Complaint  Patient presents with   Shortness of Breath    Code Stroke    Jenny Kelly is a 76 y.o. female who arrives via EMS transfer from [facility] for sepsis and code stroke.  History is gathered at bedside from the patient's husband, EMS, and review of paperwork sent from outside facility.  Patient's husband reports that she was normal this morning at 8 AM.  At 9:30 AM he went back to get her out of bed and help her get dressed for an appointment.  She was unable to move her left side.  He called 911.  The symptoms progressively worsened while at the emergency department.  She had a CT angiogram that showed an acute M1 and M2 occlusion.  Case was discussed with Dr. Cheral Marker and she was transferred to the emergency department for potential perfusion study/thrombectomy.  Her husband reports that she has had "a brewing UTI" for the past few days and this morning smelled strongly of foul urine.  The patient is not on oxygen at home however some time in between being transferred and arriving here the patient had rapid decompensation in her status and arrived hypoxic in the 70s despite being bagged on high flow oxygen and was placed on BiPAP immediately.  Patient also arrived hypotensive in the 80s.  Review of patient's paperwork showed concern for unidentified sepsis.  EMS is concerned that the patient may have aspirated causing her rapid respiratory decompensation. Patient arrives with flaccid left sided facial droop, flaccid paralysis of the left side.  All of this is new.  Patient is also aphasic and babbling but follows commands.  HPI     Home Medications Prior to Admission medications    Medication Sig Start Date End Date Taking? Authorizing Provider  amiodarone (PACERONE) 100 MG tablet Take 50 mg by mouth at bedtime.   Yes [provider]  Cyanocobalamin (B-12 PO) Take by mouth.   Yes [provider]  donepezil (ARICEPT) 10 MG tablet Take 10 mg by mouth at bedtime.   Yes [provider]  ergocalciferol (VITAMIN D2) 1.25 MG (50000 UT) capsule Take 50,000 Units by mouth See admin instructions. Takes twice a week on Wednesday and Saturday   Yes [provider]  guaiFENesin (MUCINEX PO) Take 1 tablet by mouth as needed (cough, congestion).   Yes [provider]  levothyroxine (SYNTHROID) 88 MCG tablet Take 88 mcg by mouth daily before breakfast.   Yes [provider]  montelukast (SINGULAIR) 10 MG tablet Take 10 mg by mouth at bedtime. 04/30/21  Yes [provider]  omeprazole (PRILOSEC) 40 MG capsule Take 40 mg by mouth daily. 03/15/20  Yes [provider]  Phenazopyridine HCl (URISTAT PO) Take 1 tablet by mouth as needed (UTI).   Yes [provider]  pregabalin (LYRICA) 50 MG capsule Take 50 mg by mouth 2 (two) times daily.   Yes [provider]  rivaroxaban (XARELTO) 20 MG TABS tablet Take 20 mg by mouth every evening.   Yes [provider]  topiramate (TOPAMAX) 200 MG tablet Take 200 mg by mouth 2 (two) times daily.   Yes [provider]  albuterol (PROVENTIL HFA) 108 (90 Base) MCG/ACT inhaler Inhale two puffs every 4-6 hours if  needed for coughing, wheezing, or shortness of breath. Patient not taking: Reported on 05/01/2021 12/21/20   Jessica Priest, MD  azelastine (ASTELIN) 0.1 % nasal spray Use one spray in each nostril twice daily Patient not taking: Reported on 05/01/2021 12/21/20   Jessica Priest, MD  budesonide (PULMICORT) 0.5 MG/2ML nebulizer solution Take 2 mLs (0.5 mg total) by nebulization in the morning and at bedtime. Patient not taking: Reported on 05/01/2021 01/18/21    Jessica Priest, MD  famotidine (PEPCID) 40 MG tablet Take one tablet once daily Patient not taking: Reported on 05/01/2021 12/21/20   Jessica Priest, MD  fluticasone (FLONASE) 50 MCG/ACT nasal spray SPRAY 1 SPRAY INTO BOTH NOSTRILS DAILY. Patient not taking: Reported on 05/01/2021 08/02/20   Martina Sinner, MD  Fluticasone-Umeclidin-Vilant (TRELEGY ELLIPTA) 100-62.5-25 MCG/INH AEPB Inhale 1 puff into the lungs daily. Patient not taking: Reported on 05/01/2021 12/13/20   Martina Sinner, MD  ipratropium (ATROVENT) 0.03 % nasal spray SPRAY 2 SPRAYS INTO BOTH NOSTRILS EVERY 12 HOURS. Patient not taking: Reported on 05/01/2021 04/10/21   Martina Sinner, MD      Allergies    Ciprofloxacin, Prochlorperazine edisylate, Prednisone, Reduced iso-alpha acids complex, Statins, Sulfa antibiotics, Azithromycin, and Latex    Review of Systems   Review of Systems  Physical Exam Updated Vital Signs BP (!) 115/51    Pulse (!) 101    Temp 98 F (36.7 C) (Axillary)    Resp 20    SpO2 97%  Physical Exam Constitutional:      Comments: Patient on BiPAP  HENT:     Head: Normocephalic and atraumatic.     Nose: Nose normal.  Eyes:     Extraocular Movements: Extraocular movements intact.     Pupils: Pupils are equal, round, and reactive to light.  Cardiovascular:     Rate and Rhythm: Tachycardia present. Rhythm irregular.  Pulmonary:     Comments: Diffuse rhonchi that do not clear with cough Musculoskeletal:     Cervical back: Normal range of motion.  Neurological:     Mental Status: She is alert.     Comments: Right-sided gaze preference Flaccid paralysis of the left arm and leg Garbled speech    ED Results / Procedures / Treatments   Labs (all labs ordered are listed, but only abnormal results are displayed) Labs Reviewed  CBC - Abnormal; Notable for the following components:      Result Value   MCHC 29.1 (*)    RDW 19.9 (*)    Platelets 133 (*)    All other components within normal limits   COMPREHENSIVE METABOLIC PANEL - Abnormal; Notable for the following components:   Potassium 5.6 (*)    CO2 17 (*)    Glucose, Bld 134 (*)    Creatinine, Ser 1.76 (*)    Calcium 7.8 (*)    Total Protein 5.7 (*)    Albumin 2.2 (*)    AST 60 (*)    Total Bilirubin 1.7 (*)    GFR, Estimated 30 (*)    All other components within normal limits  LACTIC ACID, PLASMA - Abnormal; Notable for the following components:   Lactic Acid, Venous 6.7 (*)    All other components within normal limits  BRAIN NATRIURETIC PEPTIDE - Abnormal; Notable for the following components:   B Natriuretic Peptide 493.7 (*)    All other components within normal limits  TROPONIN I (HIGH SENSITIVITY) - Abnormal; Notable for the following components:  Troponin I (High Sensitivity) 24 (*)    All other components within normal limits  CULTURE, BLOOD (ROUTINE X 2)  CULTURE, BLOOD (ROUTINE X 2)  URINE CULTURE  RESP PANEL BY RT-PCR (FLU A&B, COVID) ARPGX2  URINALYSIS, ROUTINE W REFLEX MICROSCOPIC  CBC  BASIC METABOLIC PANEL  MAGNESIUM  PHOSPHORUS    EKG EKG Interpretation  Date/Time:  Monday May 01 2021 16:03:40 EST Ventricular Rate:  109 PR Interval:    QRS Duration: 75 QT Interval:  385 QTC Calculation: 519 R Axis:   67 Text Interpretation: Sinus rhythm Non-specific ST-t changes Baseline wander Artifact No previous tracing Confirmed by Lajean Saver [phone]) on 05/01/2021 4:23:35 PM  Radiology CT CEREBRAL PERFUSION W CONTRAST  Result Date: 05/01/2021 EXAM: CT PERFUSION BRAIN TECHNIQUE: Multiphase CT imaging of the brain was performed following IV bolus contrast injection. Subsequent parametric perfusion maps were calculated using RAPID software. CONTRAST:  23mL OMNIPAQUE IOHEXOL 350 MG/ML SOLN COMPARISON:  None. FINDINGS: CT Brain Perfusion Findings: CBF (<30%) Volume: 71mL Perfusion (Tmax>6.0s) volume: 49mL Mismatch Volume: 41mL ASPECTS on noncontrast CT Head: 10 at 1:43 p.m. today. Infarct Core: 13 mL  Infarction Location:Right MCA territory, in the right posterior temporal, posterior frontal, and anterior parietal lobes. IMPRESSION: 13 mL infarct core, involving the right MCA territory in the right posterior temporal, posterior frontal, and anterior parietal lobes. Electronically Signed   By: Merilyn Baba M.D.   On: 05/01/2021 16:56   DG Chest Port 1 View  Result Date: 05/01/2021 CLINICAL DATA:  Shortness of breath EXAM: PORTABLE CHEST 1 VIEW COMPARISON:  06/20/2020 chest CT, radiograph 05/01/2021, 03/19/2021, 03/24/2020 FINDINGS: Diffuse bilateral reticular opacity consistent with chronic interstitial lung disease. No confluent acute airspace disease, pleural effusion or pneumothorax. Stable cardiomediastinal silhouette. Post sternotomy changes. Aortic atherosclerosis. IMPRESSION: Chronic interstitial lung disease without acute superimposed airspace disease Electronically Signed   By: Donavan Foil M.D.   On: 05/01/2021 17:36    Procedures .Critical Care Performed by: Margarita Mail, PA-C Authorized by: Margarita Mail, PA-C   Critical care provider statement:    Critical care time (minutes):  75   Critical care time was exclusive of:  Separately billable procedures and treating other patients   Critical care was necessary to treat or prevent imminent or life-threatening deterioration of the following conditions:  CNS failure or compromise, sepsis and respiratory failure   Critical care was time spent personally by me on the following activities:  Development of treatment plan with patient or surrogate, discussions with consultants, evaluation of patient's response to treatment, examination of patient, ordering and review of laboratory studies, ordering and review of radiographic studies, ordering and performing treatments and interventions, pulse oximetry, re-evaluation of patient's condition and review of old charts    Medications Ordered in ED Medications  lidocaine (XYLOCAINE) 1 % (with  pres) injection (has no administration in time range)  docusate sodium (COLACE) capsule 100 mg (has no administration in time range)  polyethylene glycol (MIRALAX / GLYCOLAX) packet 17 g (has no administration in time range)  levothyroxine (SYNTHROID) tablet 88 mcg (has no administration in time range)  amiodarone (PACERONE) tablet 50 mg (has no administration in time range)  donepezil (ARICEPT) tablet 10 mg (has no administration in time range)  montelukast (SINGULAIR) tablet 10 mg (has no administration in time range)  piperacillin-tazobactam (ZOSYN) IVPB 3.375 g (has no administration in time range)  iohexol (OMNIPAQUE) 350 MG/ML injection 40 mL (40 mLs Intravenous Contrast Given 05/01/21 1648)    ED Course/ Medical Decision  Making/ A&P Clinical Course as of 05/01/21 1801  Mon May 01, 2021  1623 EKG 12-Lead Preliminary EKG I interpreted the EKG at bedside which appears to show A. fib with RVR and PVCs [AH]  1623 Patient here in respiratory distress, known occlusive thrombus of M1 and M2 with code stroke.  There is concern for sepsis, aspiration pneumonia.  The patient is unable to give the history and history must be gathered by multiple sources. [AH]  1631 Additional history obtained in discussion with attending physician at [facility] emergency department Dr. Colin Rhein who reviewed the case but did not care for the patient directly.  He states that prior to Transfer the patient had a CT abdomen and pelvis that showed a 7 mm calculus in the left proximal ureter and a urinary tract infection which is thought to be the underlying cause of her sepsis.  She also received 3 L of lactated ringer prior to arrival and is Zosyn.  [AH]  1708 Case discussed with Dr. Cheral Marker, Dr. Norma Fredrickson regarding IR thrombectomy, initially the patient was recommended for thrombectomy however there is concern for completed stroke and this she is no longer considered safe for thrombectomy. [AH]  1710 Case discussed with Montey Hora or PCCM- They will admit the patient.  [AH]  1710 Case discussed with Dr. Lovena Neighbours of Urology who recommends IR nephrostomy at this time as he does not feel she is stable for anesthesia and stent placement. [AH]  1713 CT CEREBRAL PERFUSION W CONTRAST I reviewed and interpreted CT perfusion study which shows a large infarct on the right. [AH]  1742 Potassium(!): 5.6 [AH]  1742 CO2(!): 17 [AH]  1742 Creatinine(!): 1.76 [AH]  1742 Calcium(!): 7.8 [AH]  1742 Lactic Acid, Venous(!!): 6.7 [AH]  1749 DG Chest Port 1 View I reviewed the CXR which shows no evidence of pulmonary edema. [AH]    Clinical Course User Index [AH] Margarita Mail, PA-C                           Medical Decision Making Patient here with known thrombotic stroke, Urosepsis with obstructive left sided ureteral stone, and acute respiratory failure on Bipap. Patient meets sepsis criteria due to infected stone - UA reviewed on outside paperwork. Patient is critically ill with multi organ/ system involvement. Decision making is of High complexity.   Amount and/or Complexity of Data Reviewed Independent Historian: spouse and EMS External Data Reviewed: labs, radiology and notes. Labs: ordered. Decision-making details documented in ED Course. Radiology: ordered and independent interpretation performed. Decision-making details documented in ED Course. ECG/medicine tests: ordered and independent interpretation performed. Decision-making details documented in ED Course. Discussion of management or test interpretation with external provider(s): As discussed in ED C[facility]se  Risk Decision regarding hospitalization. Emergency major surgery.  Critical Care Total time providing critical care: 75-105 minutes  Final Clinical Impression(s) / ED Diagnoses Final diagnoses:  Cerebrovascular accident (CVA) due to thrombosis of right middle cerebral artery ([place])  Sepsis with acute hypoxic respiratory failure without septic shock, due  to unspecified organism [facility])  Ureteral stone with hydronephrosis    Rx / DC Orders ED Discharge Orders     None         Margarita Mail, PA-C 05/01/21 1801    Lajean Saver, MD 05/01/21 2240

## 2021-05-01 NOTE — Progress Notes (Signed)
RT advised for intubation over BiPAP due to possible stroke and aspiration. MD with neurology wanted to proceed with BiPAP. Patient appears to be tolerating BiPAP well at this time.

## 2021-05-01 NOTE — Progress Notes (Signed)
Patient transported from the ED to 0000000 without complication.

## 2021-05-01 NOTE — H&P (Signed)
NAME:  Jenny Kelly, MRN:  [id number], DOB:  April 11, 1946, LOS: 0 ADMISSION DATE:  05/01/2021, CONSULTATION DATE:  05/01/21 REFERRING MD:  Cheral Marker CHIEF COMPLAINT:  AMS   History of Present Illness:  Jenny Kelly is a 76 y.o. female who has a PMH as outlined below including A. fib (on Xarelto).  She presented as transfer from [place] on 1/9 with acute left hemiplegia.  She apparently had dysuria 3 days prior Friday 1/6 and took AZO with improvement in symptoms by Sunday 1/8.  Morning of 1/9, she had generalized weakness and left-sided hemiplegia.  She was taken to [facility] as a code stroke.  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  She was then transferred to [facility] for further evaluation and management.  In route to ED, she received multiple fluid boluses for concern of UTI/sepsis.  She then developed dyspnea and respiratory distress and was placed on BiPAP.  There was also concern that she might have had an aspiration event prior to being placed on BiPAP.  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  She was evaluated by neurology who did not feel that she was a candidate for any intervention.  She was subsequently admitted to the neuro ICU for ongoing management.  Pertinent  Medical History:  has CKD (chronic kidney disease), stage III ([place]); Polypharmacy; Post-concussion headache; Type 2 diabetes mellitus without complication ([place]); Hypothyroidism; Hyperlipidemia; Coronary artery disease involving native coronary artery of native heart; Atrial fibrillation ([place]); Memory deficit; Risk for falls; S/P CABG (coronary artery bypass graft); Tremor; and Stroke [facility]) on their problem list.  Significant Hospital Events: Including procedures, antibiotic start and stop dates in addition to other pertinent events   1/9 admit  Interim History / Subjective:  On BiPAP. Opens eyes to voice.  Objective:  Blood  pressure 124/79, pulse (!) 105, temperature 98 F (36.7 C), temperature source Axillary, resp. rate 20, SpO2 94 %.       No intake or output data in the 24 hours ending 05/01/21 1712 There were no vitals filed for this visit.  Examination: General: Elderly female, resting in bed, in NAD. Neuro: Somnolent but opens eyes to voice.  Follows basic commands.  LUE weakness, does have LLE plantar flexion against resistance. HEENT: [place]/AT. Sclerae anicteric. BiPAP in place. Cardiovascular: IRIR, no M/R/G.  Lungs: Respirations even and unlabored.  Coarse bilaterally. Abdomen: BS x 4, soft, NT/ND.  Musculoskeletal: No gross deformities, no edema.  Skin: Intact, warm, no rashes  Labs/imaging personally reviewed:  CT head/CTA 1/9 (per report) > right M1 and M2 occlusion. CTP 1/9 > 13 mL core infarct involving right MCA. MRI brain 1/9 > Echo 1/9 >  Assessment & Plan:   Right MCA infarct - seen by neurology and deemed to not be a candidate for any intervention. -Stroke management and work-up per neuro.  Aspiration en route to ED. - Empiric Zosyn. - Follow cultures.  Acute hypoxic respiratory failure - 2/2 above +/- mild pulmonary edema. - Continue BiPAP. - Pulmonary hygiene.  Sepsis secondary to UTI per report (no labs available in our system). - Empiric Zosyn. - Repeat UA and get urine and blood cultures. - Trend Lactate.  Hx A.fib on Xarelto, CHF, HTN, HLD, MI. - Heparin in lieu of home Xarelto, Amiodarone.  Hyperkalemia. AKI. - 1 dose Kayexalate. - Follow BMP.  Hx Hypothyroidism. - Continue home Synthroid.  Hx Seizures, TIA, Anxiety, Depression. - Supportive care.  Goals of care:  I had extensive discussion with pt's husband at bedside.  He informed me that pt has advanced directives in place already that state in the event of arrest, she would not wish for any CPR or defibrillations.  She would be ok with ACLS medication administration as well as only short term  intubation if it were for a reversible cause (husband open to further discussions regarding pt and family wishes if things reach that point).   Best practice (evaluated daily):  Diet/type: NPO DVT prophylaxis: systemic heparin GI prophylaxis: N/A Lines: N/A Foley:  N/A Code Status:  limited - no CPR or defibs.  Short term intubation OK.  ACLS meds OK. Last date of multidisciplinary goals of care discussion: None yet.  Labs   CBC: Recent Labs  Lab 05/01/21 1558  WBC 7.2  HGB 12.3  HCT 42.2  MCV 94.4  PLT 133*    Basic Metabolic Panel: No results for input(s): NA, K, CL, CO2, GLUCOSE, BUN, CREATININE, CALCIUM, MG, PHOS in the last 168 hours. GFR: CrCl cannot be calculated (No successful lab value found.). Recent Labs  Lab 05/01/21 1558  WBC 7.2    Liver Function Tests: No results for input(s): AST, ALT, ALKPHOS, BILITOT, PROT, ALBUMIN in the last 168 hours. No results for input(s): LIPASE, AMYLASE in the last 168 hours. No results for input(s): AMMONIA in the last 168 hours.  ABG No results found for: PHART, PCO2ART, PO2ART, HCO3, TCO2, ACIDBASEDEF, O2SAT   Coagulation Profile: No results for input(s): INR, PROTIME in the last 168 hours.  Cardiac Enzymes: No results for input(s): CKTOTAL, CKMB, CKMBINDEX, TROPONINI in the last 168 hours.  HbA1C: No results found for: HGBA1C  CBG: No results for input(s): GLUCAP in the last 168 hours.  Review of Systems:   Unable to obtain as pt is on BiPAP and dysarthric.  Past Medical History:  She,  has a past medical history of Allergy, Anemia, Anxiety, Arthritis, Asthma, Atrial fibrillation ([place]), Blood transfusion without reported diagnosis, Cataract, CHF (congestive heart failure) ([place]), Chronic kidney disease, Clotting disorder ([place]), COPD (chronic obstructive pulmonary disease) ([place]), Depression, Diabetes mellitus without complication ([place]), GERD (gastroesophageal reflux disease), Heart murmur, Hyperlipidemia,  Myocardial infarction ([place]), Neuromuscular disorder ([place]), Seizures ([place]), Sleep apnea, Stroke ([place]), and Thyroid disease.   Surgical History:   Past Surgical History:  Procedure Laterality Date   ABDOMINAL HYSTERECTOMY     APPENDECTOMY     bladder tack     CHOLECYSTECTOMY     CORONARY ARTERY BYPASS GRAFT     ESOPHAGOGASTRODUODENOSCOPY ENDOSCOPY     EYE SURGERY     FRACTURE SURGERY Right    3rd finger   JOINT REPLACEMENT Bilateral    knees   TONSILLECTOMY AND ADENOIDECTOMY     TUBAL LIGATION       Social History:   reports that she quit smoking about 20 years ago. Her smoking use included cigarettes. She has a 15.00 pack-year smoking history. She has never used smokeless tobacco. She reports that she does not currently use alcohol. She reports that she does not use drugs.   Family History:  Her Family history is unknown by patient.   Allergies Allergies  Allergen Reactions   Ciprofloxacin Hives    Ask patient   Prochlorperazine Edisylate Other (See Comments)    Cant swallow Muscle cramping   Prednisone    Reduced Iso-Alpha Acids Complex    Statins Other (See Comments)  Muscle weakness    Sulfa Antibiotics Swelling   Azithromycin Rash   Latex Rash     Home Medications  Prior to Admission medications   Medication Sig Start Date End Date Taking? Authorizing Provider  amiodarone (PACERONE) 100 MG tablet Take 50 mg by mouth daily.   Yes [provider]  levothyroxine (SYNTHROID) 88 MCG tablet Take 88 mcg by mouth daily before breakfast.   Yes [provider]  rivaroxaban (XARELTO) 20 MG TABS tablet Take 20 mg by mouth daily with supper.   Yes [provider]  albuterol (PROVENTIL HFA) 108 (90 Base) MCG/ACT inhaler Inhale two puffs every 4-6 hours if needed for coughing, wheezing, or shortness of breath. 12/21/20   Kozlow, Donnamarie Poag, MD  azelastine (ASTELIN) 0.1 % nasal spray Use one spray in each nostril twice daily 12/21/20   Kozlow, Donnamarie Poag, MD   budesonide (PULMICORT) 0.5 MG/2ML nebulizer solution Take 2 mLs (0.5 mg total) by nebulization in the morning and at bedtime. 01/18/21   Kozlow, Donnamarie Poag, MD  donepezil (ARICEPT) 10 MG tablet Take 10 mg by mouth at bedtime.    [provider]  Erenumab-aooe 70 MG/ML SOAJ Inject into the skin. Inject into the skin every 30 days 02/26/18   [provider]  ergocalciferol (VITAMIN D2) 1.25 MG (50000 UT) capsule Take 50,000 Units by mouth See admin instructions. Takes twice a week on Wednesday and Saturday    [provider]  famotidine (PEPCID) 40 MG tablet Take one tablet once daily 12/21/20   Kozlow, Donnamarie Poag, MD  fluticasone (FLONASE) 50 MCG/ACT nasal spray SPRAY 1 SPRAY INTO BOTH NOSTRILS DAILY. 08/02/20   Freddi Starr, MD  Fluticasone-Umeclidin-Vilant (TRELEGY ELLIPTA) 100-62.5-25 MCG/INH AEPB Inhale 1 puff into the lungs daily. 12/13/20   Freddi Starr, MD  ipratropium (ATROVENT) 0.03 % nasal spray SPRAY 2 SPRAYS INTO BOTH NOSTRILS EVERY 12 HOURS. 04/10/21   Freddi Starr, MD  omeprazole (PRILOSEC) 40 MG capsule  03/15/20   [provider]  pregabalin (LYRICA) 50 MG capsule Take 50 mg by mouth 2 (two) times daily.    [provider]  topiramate (TOPAMAX) 200 MG tablet Take 200 mg by mouth 2 (two) times daily.    [provider]     Critical care time: 40 min.   Montey Hora, [facility] For pager details, please see AMION or use Epic chat  After 1900, please call [facility] for cross coverage needs 05/01/2021, 5:12 PM

## 2021-05-01 NOTE — Progress Notes (Signed)
ANTICOAGULATION CONSULT NOTE - Initial Consult  Pharmacy Consult for Heparin Indication: atrial fibrillation  Allergies  Allergen Reactions   Ciprofloxacin Hives    Ask patient   Prochlorperazine Edisylate Other (See Comments)    Cant swallow Muscle cramping   Prednisone    Reduced Iso-Alpha Acids Complex    Statins Other (See Comments)    Muscle weakness    Sulfa Antibiotics Swelling   Azithromycin Rash   Latex Rash    Patient Measurements:   Heparin Dosing Weight: 60.7 kg  Vital Signs: Temp: 98 F (36.7 C) (01/09 1545) Temp Source: Axillary (01/09 1545) BP: 96/56 (01/09 1800) Pulse Rate: 102 (01/09 1800)  Labs: Recent Labs    05/01/21 1558  HGB 12.3  HCT 42.2  PLT 133*  CREATININE 1.76*  TROPONINIHS 24*    CrCl cannot be calculated (Unknown ideal weight.).   Medical History: Past Medical History:  Diagnosis Date   Allergy    Anemia    Anxiety    Arthritis    Asthma    Atrial fibrillation ([place])    Blood transfusion without reported diagnosis    Cataract    CHF (congestive heart failure) (HCC)    Chronic kidney disease    Clotting disorder (HCC)    COPD (chronic obstructive pulmonary disease) ([place])    Depression    Diabetes mellitus without complication (HCC)    GERD (gastroesophageal reflux disease)    Heart murmur    Hyperlipidemia    Myocardial infarction ([place])    Neuromuscular disorder ([place])    tremors   Seizures (HCC)    Sleep apnea    Stroke (HCC)    Thyroid disease     Medications:  (Not in a hospital admission)  Scheduled:   [START ON 05/02/2021] amiodarone  50 mg Oral Daily   [START ON 05/02/2021] donepezil  10 mg Oral QHS   [START ON 05/02/2021] levothyroxine  88 mcg Oral Q0600   lidocaine       [START ON 05/02/2021] montelukast  10 mg Oral QHS   sodium polystyrene  30 g Rectal Once   Infusions:   piperacillin-tazobactam (ZOSYN)  IV     PRN: docusate sodium, polyethylene glycol  Assessment: 66 yof with a history of  CKDIII, Polypharmacy, T2DM, Hypothyroidism, HLD, CAD, AF on Xarelto, S/P CABG, Tremor; and Stroke. Patient is presenting with AMS. Heparin per pharmacy consult placed for atrial fibrillation.  Patient is on Xarelto prior to arrival. Last dose 1/8 1300. Will require aPTT monitoring due to likely falsely high anti-Xa level secondary to DOAC use.  Hgb 12.3; plt 133  Goal of Therapy:  Heparin level 0.3-0.7 units/ml aPTT 66-102 seconds Monitor platelets by anticoagulation protocol: Yes   Plan:  No initial heparin bolus Start heparin infusion at 1000 units/hr Check aPTT & anti-Xa level in 8 hours and daily while on heparin Continue to monitor via aPTT until levels are correlated Continue to monitor H&H and platelets  Lorelei Pont, PharmD, BCPS 05/01/2021 6:14 PM ED Clinical Pharmacist -  [phone]

## 2021-05-01 NOTE — Anesthesia Preprocedure Evaluation (Signed)
Anesthesia Evaluation    Reviewed: Allergy & Precautions, Patient's Chart, lab work & pertinent test results  Airway        Dental   Pulmonary asthma , sleep apnea , COPD,  COPD inhaler, former smoker,           Cardiovascular + CAD, + Past MI, + CABG and +CHF  + dysrhythmias Atrial Fibrillation      Neuro/Psych  Headaches, Seizures -,  PSYCHIATRIC DISORDERS Anxiety Depression CVA    GI/Hepatic Neg liver ROS, GERD  ,  Endo/Other  diabetesHypothyroidism   Renal/GU Renal InsufficiencyRenal disease  negative genitourinary   Musculoskeletal  (+) Arthritis ,   Abdominal   Peds  Hematology  (+) Blood dyscrasia (on xarelto), ,   Anesthesia Other Findings Code Stroke  Reproductive/Obstetrics                             Anesthesia Physical Anesthesia Plan  ASA: 3 and emergent  Anesthesia Plan: General   Post-op Pain Management:    Induction: Intravenous  PONV Risk Score and Plan: 3 and Midazolam, Dexamethasone and Ondansetron  Airway Management Planned: Oral ETT  Additional Equipment:   Intra-op Plan:   Post-operative Plan: Extubation in OR and Possible Post-op intubation/ventilation  Informed Consent: I have reviewed the patients History and Physical, chart, labs and discussed the procedure including the risks, benefits and alternatives for the proposed anesthesia with the patient or authorized representative who has indicated his/her understanding and acceptance.     Dental advisory given  Plan Discussed with: CRNA  Anesthesia Plan Comments:         Anesthesia Quick Evaluation

## 2021-05-01 NOTE — ED Notes (Signed)
Report attempted x2

## 2021-05-01 NOTE — ED Notes (Signed)
Arrived to CT. Multiple IV sites infiltrate. RN attempts x2. Ultrasound IV attempted without success x2.

## 2021-05-01 NOTE — Transfer of Care (Signed)
Immediate Anesthesia Transfer of Care Note  Patient: [name]  Procedure(s) Performed: CYSTOSCOPY WITH RETROGRADE PYELOGRAM/URETERAL STENT PLACEMENT (Left: Bladder)  Patient Location: PACU  Anesthesia Type:MAC  Level of Consciousness: drowsy  Airway & Oxygen Therapy: Bipap  Post-op Assessment: Report given to RN and Post -op Vital signs reviewed and stable  Post vital signs: Reviewed and stable  Last Vitals:  Vitals Value Taken Time  BP 99/59 05/01/21 2052  Temp    Pulse 92 05/01/21 2057  Resp 20 05/01/21 2057  SpO2 90 % 05/01/21 2057  Vitals shown include unvalidated device data.  Last Pain:  Vitals:   05/01/21 1545  TempSrc: Axillary         Complications: No notable events documented.

## 2021-05-02 ENCOUNTER — Inpatient Hospital Stay (HOSPITAL_COMMUNITY): Payer: Medicare HMO

## 2021-05-02 ENCOUNTER — Inpatient Hospital Stay: Payer: Self-pay

## 2021-05-02 ENCOUNTER — Encounter (HOSPITAL_COMMUNITY): Payer: Self-pay | Admitting: Urology

## 2021-05-02 DIAGNOSIS — J9601 Acute respiratory failure with hypoxia: Secondary | ICD-10-CM

## 2021-05-02 DIAGNOSIS — N132 Hydronephrosis with renal and ureteral calculous obstruction: Secondary | ICD-10-CM

## 2021-05-02 DIAGNOSIS — R000 Tachycardia, unspecified: Secondary | ICD-10-CM | POA: Diagnosis not present

## 2021-05-02 DIAGNOSIS — R001 Bradycardia, unspecified: Secondary | ICD-10-CM | POA: Diagnosis not present

## 2021-05-02 DIAGNOSIS — I6389 Other cerebral infarction: Secondary | ICD-10-CM | POA: Diagnosis not present

## 2021-05-02 DIAGNOSIS — A419 Sepsis, unspecified organism: Secondary | ICD-10-CM

## 2021-05-02 DIAGNOSIS — I63511 Cerebral infarction due to unspecified occlusion or stenosis of right middle cerebral artery: Secondary | ICD-10-CM

## 2021-05-02 DIAGNOSIS — I639 Cerebral infarction, unspecified: Secondary | ICD-10-CM | POA: Diagnosis not present

## 2021-05-02 DIAGNOSIS — I63311 Cerebral infarction due to thrombosis of right middle cerebral artery: Secondary | ICD-10-CM | POA: Diagnosis not present

## 2021-05-02 DIAGNOSIS — R6520 Severe sepsis without septic shock: Secondary | ICD-10-CM

## 2021-05-02 DIAGNOSIS — I480 Paroxysmal atrial fibrillation: Secondary | ICD-10-CM | POA: Diagnosis not present

## 2021-05-02 DIAGNOSIS — Z95828 Presence of other vascular implants and grafts: Secondary | ICD-10-CM

## 2021-05-02 LAB — URINE CULTURE: Culture: <10000 — AB

## 2021-05-02 LAB — COMPREHENSIVE METABOLIC PANEL
ALT: 9 U/L (ref 0–44)
AST: 22 U/L (ref 15–41)
Albumin: 1.9 g/dL — ABNORMAL LOW (ref 3.5–5.0)
Alkaline Phosphatase: 47 U/L (ref 38–126)
Anion gap: 7 (ref 5–15)
BUN: 20 mg/dL (ref 8–23)
CO2: 19 mmol/L — ABNORMAL LOW (ref 22–32)
Calcium: 7.3 mg/dL — ABNORMAL LOW (ref 8.9–10.3)
Chloride: 111 mmol/L (ref 98–111)
Creatinine, Ser: 1.48 mg/dL — ABNORMAL HIGH (ref 0.44–1.00)
GFR, Estimated: 37 mL/min — ABNORMAL LOW (ref >60)
Glucose, Bld: 185 mg/dL — ABNORMAL HIGH (ref 70–99)
Potassium: 3.9 mmol/L (ref 3.5–5.1)
Sodium: 137 mmol/L (ref 135–145)
Total Bilirubin: 0.5 mg/dL (ref 0.3–1.2)
Total Protein: 5.1 g/dL — ABNORMAL LOW (ref 6.5–8.1)

## 2021-05-02 LAB — BLOOD CULTURE ID PANEL (REFLEXED) - BCID2

## 2021-05-02 LAB — CBC WITH DIFFERENTIAL/PLATELET
Abs Immature Granulocytes: 1.23 10*3/uL — ABNORMAL HIGH (ref 0.00–0.07)
Basophils Absolute: 0.1 10*3/uL (ref 0.0–0.1)
Basophils Relative: 0 %
Eosinophils Absolute: 0 10*3/uL (ref 0.0–0.5)
Eosinophils Relative: 0 %
HCT: 32.2 % — ABNORMAL LOW (ref 36.0–46.0)
Hemoglobin: 10 g/dL — ABNORMAL LOW (ref 12.0–15.0)
Immature Granulocytes: 3 %
Lymphocytes Relative: 6 %
Lymphs Abs: 2.5 10*3/uL (ref 0.7–4.0)
MCH: 27.7 pg (ref 26.0–34.0)
MCHC: 31.1 g/dL (ref 30.0–36.0)
MCV: 89.2 fL (ref 80.0–100.0)
Monocytes Absolute: 1.9 10*3/uL — ABNORMAL HIGH (ref 0.1–1.0)
Monocytes Relative: 4 %
Neutro Abs: 39.5 10*3/uL — ABNORMAL HIGH (ref 1.7–7.7)
Neutrophils Relative %: 87 %
Platelets: 224 10*3/uL (ref 150–400)
RBC: 3.61 MIL/uL — ABNORMAL LOW (ref 3.87–5.11)
RDW: 19.9 % — ABNORMAL HIGH (ref 11.5–15.5)
Smear Review: ADEQUATE
WBC Morphology: INCREASED
WBC: 45.3 10*3/uL — ABNORMAL HIGH (ref 4.0–10.5)
nRBC: 0 % (ref 0.0–0.2)

## 2021-05-02 LAB — BASIC METABOLIC PANEL
Anion gap: 10 (ref 5–15)
BUN: 19 mg/dL (ref 8–23)
CO2: 18 mmol/L — ABNORMAL LOW (ref 22–32)
Calcium: 8 mg/dL — ABNORMAL LOW (ref 8.9–10.3)
Chloride: 109 mmol/L (ref 98–111)
Creatinine, Ser: 1.83 mg/dL — ABNORMAL HIGH (ref 0.44–1.00)
GFR, Estimated: 28 mL/min — ABNORMAL LOW (ref >60)
Glucose, Bld: 142 mg/dL — ABNORMAL HIGH (ref 70–99)
Potassium: 4.2 mmol/L (ref 3.5–5.1)
Sodium: 137 mmol/L (ref 135–145)

## 2021-05-02 LAB — ECHOCARDIOGRAM COMPLETE
AR max vel: 1.74 cm2
AV Area VTI: 1.72 cm2
AV Area mean vel: 1.67 cm2
AV Mean grad: 2 mmHg
AV Peak grad: 4.4 mmHg
Ao pk vel: 1.05 m/s
Area-P 1/2: 5.66 cm2
Height: 61 in
S' Lateral: 3.6 cm
Weight: 2222.24 oz

## 2021-05-02 LAB — LACTIC ACID, PLASMA: Lactic Acid, Venous: 4.6 mmol/L (ref 0.5–1.9)

## 2021-05-02 LAB — CBC
HCT: 36.6 % (ref 36.0–46.0)
Hemoglobin: 11.1 g/dL — ABNORMAL LOW (ref 12.0–15.0)
MCH: 27.6 pg (ref 26.0–34.0)
MCHC: 30.3 g/dL (ref 30.0–36.0)
MCV: 91 fL (ref 80.0–100.0)
Platelets: 168 10*3/uL (ref 150–400)
RBC: 4.02 MIL/uL (ref 3.87–5.11)
RDW: 19.8 % — ABNORMAL HIGH (ref 11.5–15.5)
WBC: 39.5 10*3/uL — ABNORMAL HIGH (ref 4.0–10.5)
nRBC: 0 % (ref 0.0–0.2)

## 2021-05-02 LAB — PHOSPHORUS: Phosphorus: 2.9 mg/dL (ref 2.5–4.6)

## 2021-05-02 LAB — HEPARIN LEVEL (UNFRACTIONATED)
Heparin Unfractionated: 0.59 IU/mL (ref 0.30–0.70)
Heparin Unfractionated: 0.37 IU/mL (ref 0.30–0.70)

## 2021-05-02 LAB — MRSA NEXT GEN BY PCR, NASAL: MRSA by PCR Next Gen: NOT DETECTED

## 2021-05-02 LAB — TSH: TSH: 0.784 u[IU]/mL (ref 0.350–4.500)

## 2021-05-02 LAB — PROTIME-INR
INR: 1.4 — ABNORMAL HIGH (ref 0.8–1.2)
Prothrombin Time: 17.5 seconds — ABNORMAL HIGH (ref 11.4–15.2)

## 2021-05-02 LAB — MAGNESIUM
Magnesium: 1.3 mg/dL — ABNORMAL LOW (ref 1.7–2.4)
Magnesium: 3.5 mg/dL — ABNORMAL HIGH (ref 1.7–2.4)

## 2021-05-02 LAB — APTT
aPTT: 61 seconds — ABNORMAL HIGH (ref 24–36)
aPTT: 108 s — ABNORMAL HIGH (ref 24–36)

## 2021-05-02 MED ORDER — ATROPINE SULFATE 1 MG/10ML IJ SOSY
PREFILLED_SYRINGE | INTRAMUSCULAR | Status: AC
  Administered 2021-05-02: 1 mg
  Filled 2021-05-02: qty 10

## 2021-05-02 MED ORDER — ACETAMINOPHEN 325 MG PO TABS
650.0000 mg | ORAL_TABLET | ORAL | Status: DC | PRN
  Administered 2021-05-02 – 2021-05-06 (×7): 650 mg via ORAL
  Filled 2021-05-02 (×7): qty 2

## 2021-05-02 MED ORDER — VASOPRESSIN 20 UNITS/100 ML INFUSION FOR SHOCK
0.0000 [IU]/min | INTRAVENOUS | Status: DC
  Administered 2021-05-02 (×3): 0.03 [IU]/min via INTRAVENOUS
  Filled 2021-05-02 (×3): qty 100

## 2021-05-02 MED ORDER — NOREPINEPHRINE 4 MG/250ML-% IV SOLN
2.0000 ug/min | INTRAVENOUS | Status: DC
  Administered 2021-05-02: 2 ug/min via INTRAVENOUS
  Administered 2021-05-02: 10 ug/min via INTRAVENOUS
  Administered 2021-05-02: 6 ug/min via INTRAVENOUS
  Administered 2021-05-03: 10 ug/min via INTRAVENOUS
  Administered 2021-05-04: 2 ug/min via INTRAVENOUS
  Filled 2021-05-02 (×4): qty 250

## 2021-05-02 MED ORDER — SODIUM CHLORIDE 0.9% FLUSH
10.0000 mL | Freq: Two times a day (BID) | INTRAVENOUS | Status: DC
  Administered 2021-05-02 – 2021-05-03 (×2): 20 mL
  Administered 2021-05-03 – 2021-05-04 (×2): 10 mL
  Administered 2021-05-05: 20 mL
  Administered 2021-05-05 – 2021-05-06 (×2): 10 mL

## 2021-05-02 MED ORDER — ORAL CARE MOUTH RINSE
15.0000 mL | Freq: Two times a day (BID) | OROMUCOSAL | Status: DC
  Administered 2021-05-02 – 2021-05-04 (×5): 15 mL via OROMUCOSAL

## 2021-05-02 MED ORDER — MIDODRINE HCL 5 MG PO TABS
5.0000 mg | ORAL_TABLET | Freq: Three times a day (TID) | ORAL | Status: DC
  Administered 2021-05-02: 5 mg via ORAL
  Filled 2021-05-02: qty 1

## 2021-05-02 MED ORDER — SODIUM CHLORIDE 0.9 % IV BOLUS
1000.0000 mL | Freq: Once | INTRAVENOUS | Status: AC
  Administered 2021-05-02: 1000 mL via INTRAVENOUS

## 2021-05-02 MED ORDER — SODIUM CHLORIDE 0.9 % IV SOLN: INTRAVENOUS | Status: DC | PRN

## 2021-05-02 MED ORDER — MAGNESIUM SULFATE 2 GM/50ML IV SOLN
2.0000 g | Freq: Once | INTRAVENOUS | Status: AC
  Administered 2021-05-02: 2 g via INTRAVENOUS
  Filled 2021-05-02: qty 50

## 2021-05-02 MED ORDER — DOPAMINE-DEXTROSE 3.2-5 MG/ML-% IV SOLN
0.0000 ug/kg/min | INTRAVENOUS | Status: DC
  Administered 2021-05-02: 2.5 ug/kg/min via INTRAVENOUS

## 2021-05-02 MED ORDER — SODIUM CHLORIDE 0.9 % IV SOLN
250.0000 mL | INTRAVENOUS | Status: DC
  Administered 2021-05-02: 250 mL via INTRAVENOUS

## 2021-05-02 MED ORDER — MAGNESIUM SULFATE 4 GM/100ML IV SOLN
4.0000 g | Freq: Once | INTRAVENOUS | Status: AC
  Administered 2021-05-02: 4 g via INTRAVENOUS
  Filled 2021-05-02: qty 100

## 2021-05-02 MED ORDER — MIDODRINE HCL 5 MG PO TABS: 5.0000 mg | ORAL_TABLET | Freq: Three times a day (TID) | ORAL | Status: DC

## 2021-05-02 MED ORDER — SODIUM CHLORIDE 0.9% FLUSH: 10.0000 mL | INTRAVENOUS | Status: DC | PRN

## 2021-05-02 MED ORDER — NITROGLYCERIN 0.4 MG SL SUBL
SUBLINGUAL_TABLET | SUBLINGUAL | Status: AC
  Filled 2021-05-02: qty 2

## 2021-05-02 MED ORDER — SODIUM CHLORIDE 0.9 % IV SOLN
2.0000 g | INTRAVENOUS | Status: DC
  Administered 2021-05-02 – 2021-05-03 (×2): 2 g via INTRAVENOUS
  Filled 2021-05-02 (×4): qty 20

## 2021-05-02 MED ORDER — LACTATED RINGERS IV BOLUS
1000.0000 mL | Freq: Once | INTRAVENOUS | Status: AC
  Administered 2021-05-02: 1000 mL via INTRAVENOUS

## 2021-05-02 MED ORDER — DOPAMINE-DEXTROSE 3.2-5 MG/ML-% IV SOLN
INTRAVENOUS | Status: AC
  Filled 2021-05-02: qty 250

## 2021-05-02 NOTE — Progress Notes (Signed)
eLink Physician-Brief Progress Note Patient Name: Jenny Kelly DOB: 1946/04/21 MRN: [id number]   Date of Service  05/02/2021  HPI/Events of Note  BP 90/47, MAP 61 after 1000 ml iv crystalloid bolus.  eICU Interventions  Peripheral Norepinephrine gtt ordered.        Thomasene Lot [name] 05/02/2021, 3:21 AM

## 2021-05-02 NOTE — Progress Notes (Signed)
eLink Physician-Brief Progress Note Patient Name: Jenny Kelly DOB: 1946-02-04 MRN: [id number]   Date of Service  05/02/2021  HPI/Events of Note  Patient with bradycardia (heart rate < 50) and hypotension (MAP < 55) on Peripheral Norepinephrine gtt at 10 mcg.  eICU Interventions  Will order low dose Dopamine gtt, and bedside RN has been instructed to wean Norepinephrine off if Dopamine normalizes patient's BP and heart rate.        Thomasene Lot Kamron Portee 05/02/2021, 4:24 AM

## 2021-05-02 NOTE — Progress Notes (Signed)
ANTICOAGULATION CONSULT NOTE - Initial Consult  Pharmacy Consult for Heparin Indication: atrial fibrillation  Allergies  Allergen Reactions   Ciprofloxacin Hives    Ask patient   Prochlorperazine Edisylate Other (See Comments)    Cant swallow Muscle cramping   Prednisone    Reduced Iso-Alpha Acids Complex    Statins Other (See Comments)    Muscle weakness    Sulfa Antibiotics Swelling   Azithromycin Rash   Latex Rash    Patient Measurements: Height: 5\' 1"  (154.9 cm) Weight: 63 kg (138 lb 14.2 oz) IBW/kg (Calculated) : 47.8 Heparin Dosing Weight: 60.7 kg  Vital Signs: Temp: 98.4 F (36.9 C) (01/10 0000) Temp Source: Axillary (01/10 0000) BP: 81/59 (01/10 0100) Pulse Rate: 83 (01/10 0100)  Labs: Recent Labs    05/01/21 1558 05/01/21 2322  HGB 12.3 11.1*  HCT 42.2 36.6  PLT 133* 168  APTT  --  61*  LABPROT  --  17.5*  INR  --  1.4*  HEPARINUNFRC  --  0.59  CREATININE 1.76* 1.83*  TROPONINIHS 24*  --      Estimated Creatinine Clearance: 22.6 mL/min (A) (by C-G formula based on SCr of 1.83 mg/dL (H)).   Medical History: Past Medical History:  Diagnosis Date   Allergy    Anemia    Anxiety    Arthritis    Asthma    Atrial fibrillation ([place])    Blood transfusion without reported diagnosis    Cataract    CHF (congestive heart failure) (HCC)    Chronic kidney disease    Clotting disorder (HCC)    COPD (chronic obstructive pulmonary disease) ([place])    Depression    Diabetes mellitus without complication (HCC)    GERD (gastroesophageal reflux disease)    Heart murmur    Hyperlipidemia    Myocardial infarction ([place])    Neuromuscular disorder (HCC)    tremors   Seizures (HCC)    Sleep apnea    Stroke ([place])    Thyroid disease     Medications:  Medications Prior to Admission  Medication Sig Dispense Refill Last Dose   amiodarone (PACERONE) 100 MG tablet Take 50 mg by mouth at bedtime.   04/30/2021   Cyanocobalamin (B-12 PO) Take by mouth.    05/01/2021   donepezil (ARICEPT) 10 MG tablet Take 10 mg by mouth at bedtime.   04/30/2021   ergocalciferol (VITAMIN D2) 1.25 MG (50000 UT) capsule Take 50,000 Units by mouth See admin instructions. Takes twice a week on Wednesday and Saturday   Past Week   guaiFENesin (MUCINEX PO) Take 1 tablet by mouth as needed (cough, congestion).   05/01/2021   levothyroxine (SYNTHROID) 88 MCG tablet Take 88 mcg by mouth daily before breakfast.   05/01/2021   montelukast (SINGULAIR) 10 MG tablet Take 10 mg by mouth at bedtime.   04/30/2021   omeprazole (PRILOSEC) 40 MG capsule Take 40 mg by mouth daily.   04/30/2021   Phenazopyridine HCl (URISTAT PO) Take 1 tablet by mouth as needed (UTI).   04/29/2021   pregabalin (LYRICA) 50 MG capsule Take 50 mg by mouth 2 (two) times daily.   04/30/2021   rivaroxaban (XARELTO) 20 MG TABS tablet Take 20 mg by mouth every evening.   04/30/2021 at 1300   topiramate (TOPAMAX) 200 MG tablet Take 200 mg by mouth 2 (two) times daily.   04/30/2021   albuterol (PROVENTIL HFA) 108 (90 Base) MCG/ACT inhaler Inhale two puffs every 4-6 hours if needed for coughing,  wheezing, or shortness of breath. (Patient not taking: Reported on 05/01/2021) 1 each 1 Not Taking   azelastine (ASTELIN) 0.1 % nasal spray Use one spray in each nostril twice daily (Patient not taking: Reported on 05/01/2021) 30 mL 5 Not Taking   budesonide (PULMICORT) 0.5 MG/2ML nebulizer solution Take 2 mLs (0.5 mg total) by nebulization in the morning and at bedtime. (Patient not taking: Reported on 05/01/2021) 120 mL 5 Not Taking   famotidine (PEPCID) 40 MG tablet Take one tablet once daily (Patient not taking: Reported on 05/01/2021) 30 tablet 5 Not Taking   fluticasone (FLONASE) 50 MCG/ACT nasal spray SPRAY 1 SPRAY INTO BOTH NOSTRILS DAILY. (Patient not taking: Reported on 05/01/2021) 48 mL 2 Not Taking   Fluticasone-Umeclidin-Vilant (TRELEGY ELLIPTA) 100-62.5-25 MCG/INH AEPB Inhale 1 puff into the lungs daily. (Patient not taking: Reported on  05/01/2021) 28 each 6 Not Taking   ipratropium (ATROVENT) 0.03 % nasal spray SPRAY 2 SPRAYS INTO BOTH NOSTRILS EVERY 12 HOURS. (Patient not taking: Reported on 05/01/2021) 90 mL 3 Not Taking    Scheduled:   amiodarone  50 mg Oral Daily   Chlorhexidine Gluconate Cloth  6 each Topical Daily   donepezil  10 mg Oral QHS   levothyroxine  88 mcg Oral Q0600   lidocaine       montelukast  10 mg Oral QHS   Infusions:   heparin 1,000 Units/hr (05/01/21 2200)   piperacillin-tazobactam (ZOSYN)  IV     PRN: docusate sodium, polyethylene glycol  Assessment: 17 yof with a history of CKDIII, Polypharmacy, T2DM, Hypothyroidism, HLD, CAD, AF on Xarelto, S/P CABG, Tremor; and Stroke. Patient is presenting with AMS. Heparin per pharmacy consult placed for atrial fibrillation.  Patient is on Xarelto prior to arrival. Initial HL and aPTT do not precisely correlate. Will make adjustment based off aPTT which is subtherapeutic on 1000 units/hr of IV heparin. RN reports no s/s of bleeding   Goal of Therapy:  Heparin level 0.3-0.7 units/ml aPTT 66-102 seconds Monitor platelets by anticoagulation protocol: Yes   Plan:  Increase heparin infusion to 1100 units/hr Check aPTT & anti-Xa level in 8 hours and daily while on heparin Continue to monitor via aPTT until levels are correlated Continue to monitor H&H and platelets  Albertina Parr, PharmD., BCPS, BCCCP Clinical Pharmacist Please refer to [facility] for unit-specific pharmacist

## 2021-05-02 NOTE — Progress Notes (Signed)
Peripherally Inserted Central Catheter Placement  The IV Nurse has discussed with the patient and/or persons authorized to consent for the patient, the purpose of this procedure and the potential benefits and risks involved with this procedure.  The benefits include less needle sticks, lab draws from the catheter, and the patient may be discharged home with the catheter. Risks include, but not limited to, infection, bleeding, blood clot (thrombus formation), and puncture of an artery; nerve damage and irregular heartbeat and possibility to perform a PICC exchange if needed/ordered by physician.  Alternatives to this procedure were also discussed.  Bard Power PICC patient education guide, fact sheet on infection prevention and patient information card has been provided to patient /or left at bedside.    PICC Placement Documentation  PICC Double Lumen [id number] PICC Right Basilic 36 cm 0 cm (Active)  Indication for Insertion or Continuance of Line Vasoactive infusions 05/02/21 1400  Exposed Catheter (cm) 0 cm 05/02/21 1400  Site Assessment Clean;Dry;Intact 05/02/21 1400  Lumen #1 Status Flushed;Blood return noted 05/02/21 1400  Lumen #2 Status Flushed;Blood return noted 05/02/21 1400  Dressing Type Transparent 05/02/21 1400  Dressing Status Clean;Dry;Intact 05/02/21 1400  Antimicrobial disc in place? Yes 05/02/21 1400  Dressing Change Due 05/09/21 05/02/21 1400       Jule Economy Horton 05/02/2021, 2:52 PM

## 2021-05-02 NOTE — Progress Notes (Addendum)
ANTICOAGULATION CONSULT NOTE  Pharmacy Consult for Heparin Indication: atrial fibrillation  Allergies  Allergen Reactions   Ciprofloxacin Hives    Ask patient   Prochlorperazine Edisylate Other (See Comments)    Cant swallow Muscle cramping   Prednisone    Reduced Iso-Alpha Acids Complex    Statins Other (See Comments)    Muscle weakness    Sulfa Antibiotics Swelling   Azithromycin Rash   Latex Rash    Patient Measurements: Height: 5\' 1"  (154.9 cm) Weight: 63 kg (138 lb 14.2 oz) IBW/kg (Calculated) : 47.8 Heparin Dosing Weight: 60.7 kg  Vital Signs: Temp: 97.8 F (36.6 C) (01/10 1200) Temp Source: Oral (01/10 1200) BP: 132/85 (01/10 1230) Pulse Rate: 49 (01/10 1230)  Labs: Recent Labs    05/01/21 1558 05/01/21 2322 05/02/21 0910 05/02/21 1140  HGB 12.3 11.1* 10.0*  --   HCT 42.2 36.6 32.2*  --   PLT 133* 168 224  --   APTT  --  61*  --  108*  LABPROT  --  17.5*  --   --   INR  --  1.4*  --   --   HEPARINUNFRC  --  0.59  --  0.37  CREATININE 1.76* 1.83* 1.48*  --   TROPONINIHS 24*  --   --   --      Estimated Creatinine Clearance: 27.9 mL/min (A) (by C-G formula based on SCr of 1.48 mg/dL (H)).   Assessment: 46 YOF with a history of CKDIII, polypharmacy, T2DM, hypothyroidism, HLD, CAD, AF on Xarelto, S/P CABG, tremor and stroke. Patient is presenting with AMS. Heparin per pharmacy consult placed for possible CVA. Now indicated for AF.  Patient is on Xarelto prior to arrival (LD 1/8 at 1300); therefore, will use aPTT to guide heparin dosing as Xarelto may falsely elevate heparin levels. Heparin level decreased to 0.37 units/mL and aPTT increased to supra-therapeutic level of 108 sec.  Given large increase in setting of possible CVA, will reduce heparin rate.  Lab obtained appropriately and no bleeding per discussion with RN.  Goal of Therapy:  Heparin level 0.3-0.5 units/ml aPTT 66-85 seconds to err on safe side Monitor platelets by anticoagulation  protocol: Yes   Plan:  Decrease heparin infusion to 1050 units/hr Check aPTT & anti-Xa level in morning and daily while on heparin Continue to monitor via aPTT until levels are correlated Continue to monitor H&H and platelets   [place] D. Mina Marble, PharmD, BCPS, [place] 05/02/2021, 2:45 PM

## 2021-05-02 NOTE — Progress Notes (Signed)
An USGPIV (ultrasound guided PIV) has been placed for short-term vasopressor infusion. A correctly placed ivWatch must be used when administering Vasopressors. Should this treatment be needed beyond 72 hours, central line access should be obtained.  It will be the responsibility of the bedside nurse to follow best practice to prevent extravasations.   ?

## 2021-05-02 NOTE — Progress Notes (Addendum)
STROKE TEAM PROGRESS NOTE   ATTENDING NOTE: I reviewed above note and agree with the assessment and plan. Pt was seen and examined.   76 year old female with history of A. fib on Xarelto, MI status post stenting 2002 c/b PE and then CABG 2015, ICH 2009, pulmonary fibrosis since 07/2019 COVID, frequent UTI admitted for lethargy, smelly urine, left-sided weakness and left facial droop.  CT no acute abnormality.  CTA head and neck showed right M1 and proximal right M2 flow gap, concerning for high-grade stenosis versus occlusion.  CTP 13/29.  MRI pending.  EF 60 to 65%.  LDL and A1c pending.  Creatinine 1.83.  UA WBC > 50, WBC 39.5 significantly elevated from previous 7.2. found to have urethral stone s/p stenting by urology. Septic picture also with respiratory failure.  Put on BiPAP and stable.  Also has hypotension, put on dopamine IV then developed A. fib RVR then VT, dopamine discontinued, currently on Levophed and vasopressin for BP management.  Patient continues to have bradycardia, received atropine, cardiology consulted.  On exam, no family at bedside.  Patient lying bed, awake alert, orientated x4.  No aphasia, fluent language, mild dysarthria, follows simple commands, able to name and repeat.  Visual fields full, no gaze palsy, mild left facial droop, left upper extremity mild drift, 3+/5, otherwise no weakness found right upper and bilateral lower extremities.  Sensation symmetrical, right finger-to-nose intact.  Etiology for patient stroke likely due to right M1 and proximal right M2 high-grade stenosis/occlusion in the setting of sepsis, septic shock and hypotension.  BP goal 1 20-1 40 from neuro standpoint.  Currently on Levophed and vasopressin for BP management, initially added midodrine but hold off due to bradycardia.  Cardiology consulted, DC Midrin and amiodarone, okay with IV fluid.  Continue heparin IV for stroke prevention.  Continue Zosyn for sepsis treatment.  Appreciate CCM and  cardiology assistance.  For detailed assessment and plan, please refer to above as I have made changes wherever appropriate.   Marvel Plan, MD PhD Stroke Neurology 05/02/2021 6:55 PM  This patient is critically ill due to sepsis, septic shock, PAF, bradycardia, hypotension, stroke, right MCA high-grade stenosis/occlusion and at significant risk of neurological worsening, death form recurrent stroke, hemorrhagic transformation, heart failure, cardiac arrest, septic shock, seizure. This patient's care requires constant monitoring of vital signs, hemodynamics, respiratory and cardiac monitoring, review of multiple databases, neurological assessment, discussion with family, other specialists and medical decision making of high complexity. I spent 40 minutes of neurocritical care time in the care of this patient.  I discussed with CCM Dr. Denese Killings.    INTERVAL HISTORY RN is at the bedside. Patient has had difficulty with her blood pressure and heart rate today. Dopamine d/c'd after a 25 beat run of VT. Norepinephrine and vasopressin infusing. Midodrine 5mg  TID. Plan for MRI when BP and HR are more stable. WBC 45.3, Cr 1.48, K 3.9. Midodrine on hold due to concern for bradycardia. Cardiology consulted.  Vitals:   05/02/21 0620 05/02/21 0625 05/02/21 0700 05/02/21 0800  BP:  (!) 106/45 (!) 153/113 137/65  Pulse: (!) 46 86 79 64  Resp: 11 15 16 14   Temp:      TempSrc:      SpO2: 100% 98% 97% 100%  Weight:      Height:       CBC:  Recent Labs  Lab 05/01/21 1558 05/01/21 2322  WBC 7.2 39.5*  HGB 12.3 11.1*  HCT 42.2 36.6  MCV 94.4 91.0  PLT 133* [id number]   Basic Metabolic Panel:  Recent Labs  Lab 05/01/21 1558 05/01/21 2322  NA 136 137  K 5.6* 4.2  CL 107 109  CO2 17* 18*  GLUCOSE 134* 142*  BUN 18 19  CREATININE 1.76* 1.83*  CALCIUM 7.8* 8.0*  MG  --  1.3*  PHOS  --  2.9   Lipid Panel: No results for input(s): CHOL, TRIG, HDL, CHOLHDL, VLDL, LDLCALC in the last 168  hours. HgbA1c: No results for input(s): HGBA1C in the last 168 hours. Urine Drug Screen: No results for input(s): LABOPIA, COCAINSCRNUR, LABBENZ, AMPHETMU, THCU, LABBARB in the last 168 hours.  Alcohol Level No results for input(s): ETH in the last 168 hours.  IMAGING past 24 hours CT CEREBRAL PERFUSION W CONTRAST  Result Date: 05/01/2021 EXAM: CT PERFUSION BRAIN TECHNIQUE: Multiphase CT imaging of the brain was performed following IV bolus contrast injection. Subsequent parametric perfusion maps were calculated using RAPID software. CONTRAST:  58mL OMNIPAQUE IOHEXOL 350 MG/ML SOLN COMPARISON:  None. FINDINGS: CT Brain Perfusion Findings: CBF (<30%) Volume: 19mL Perfusion (Tmax>6.0s) volume: 24mL Mismatch Volume: 27mL ASPECTS on noncontrast CT Head: 10 at 1:43 p.m. today. Infarct Core: 13 mL Infarction Location:Right MCA territory, in the right posterior temporal, posterior frontal, and anterior parietal lobes. IMPRESSION: 13 mL infarct core, involving the right MCA territory in the right posterior temporal, posterior frontal, and anterior parietal lobes. Electronically Signed   By: Merilyn Baba M.D.   On: 05/01/2021 16:56   DG Chest Port 1 View  Result Date: 05/01/2021 CLINICAL DATA:  Shortness of breath EXAM: PORTABLE CHEST 1 VIEW COMPARISON:  06/20/2020 chest CT, radiograph 05/01/2021, 03/19/2021, 03/24/2020 FINDINGS: Diffuse bilateral reticular opacity consistent with chronic interstitial lung disease. No confluent acute airspace disease, pleural effusion or pneumothorax. Stable cardiomediastinal silhouette. Post sternotomy changes. Aortic atherosclerosis. IMPRESSION: Chronic interstitial lung disease without acute superimposed airspace disease Electronically Signed   By: Donavan Foil M.D.   On: 05/01/2021 17:36   DG C-Arm 1-60 Min  Result Date: 05/01/2021 CLINICAL DATA:  Cystoscopy with retrograde pyelogram EXAM: DG C-ARM 1-60 MIN FLUOROSCOPY TIME:  Fluoroscopy Time:  14 seconds Radiation  Exposure Index (if provided by the fluoroscopic device): 3.49 mGy Number of Acquired Spot Images: 0 COMPARISON:  None. FINDINGS: Multiple images are submitted from a left retrograde pyelogram. No visible filling defect. Final images demonstrate placement of left ureteral stent. IMPRESSION: As above. Electronically Signed   By: Rolm Baptise M.D.   On: 05/01/2021 21:02    Physical Exam  Constitutional: Appears well-developed and well-nourished.  Cardiovascular: Normal rate and regular rhythm.  Respiratory: Effort normal, non-labored breathing  Neuro: Mental Status: Patient is awake, alert, oriented to person, place, month, year, and situation. Patient is able to give a clear and coherent history. No signs of aphasia or neglect Cranial Nerves: II: Visual Fields are full. Pupils are equal, round, and reactive to light.   III,IV, VI: EOMI without ptosis or diploplia.  V: Facial sensation is symmetric to temperature VII: Slight left facial droop VIII: Hearing is intact to voice X: Palate elevates symmetrically XI: Shoulder shrug is symmetric. XII: Tongue protrudes midline without atrophy or fasciculations.  Motor: Tone is normal. Bulk is normal. Hand strength is just about equal  RUE- 4/5 slight drift  LUE- 3+/5 RLE- 4/5 slight drift   LLE- 4/5 Sensory: Sensation is symmetric to light touch and temperature in the arms and legs. No extinction to DSS present.  Coordination: FNF intact bilaterally  ASSESSMENT/PLAN Jenny Kelly is a 76 y.o. female with history of a.fib (xarelto) Allergy, Anemia, Anxiety, Arthritis, Asthma, Cataract, CHF, Chronic kidney disease, Clotting disorder , COPD, Depression, Diabetes mellitus without complication , GERD  Heart murmur, Hyperlipidemia, Myocardial infarction, Neuromuscular disorder , Seizures , Sleep apnea, Stroke and Thyroid disease. Patient's husband reports Rosealyn had dysuria on Friday, and she took AZO OTC, with resolution of sx on Sunday (1/8).  This morning, patient was up at 8 AM, ambulated to the restroom without issue. Husband reports her urine smelled "awful." At 9:30 AM, he was unable to get her to help with getting dressed; she was lethargic and weak. He did not notice if the weakness was unilateral or generalized. He then called EMS, who took her to [facility]. Around noon while at [place], patient was with nonsensical speech and a L facial droop. Teleneurology evaluated the patient. She was not a candidate for TNK due to anticoagulation. CTA of head and neck revealed an acute right M1 occlusion and severe stenosis of the right M2 more distally. The patient was emergently transported to [facility] for CTP and possible mechanical thrombectomy. Neurology and Neuro IR decided against mechanical thrombectomy given small penumbra.  Patient experienced bradycardia and dopamine was started, she then had a 25 beat run of VT. Dopamine was d/c'd, then she became bradycardic again and atropine was needed. She is currently on norepinephrine and vasopressin for BP and HR control. Cardiology consult placed today. WBC elevated to 45.3, zosyn started and cultures pending.   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 MRI  Pending 2D Echo EF 60-65% LDL Pending HgbA1c Pending VTE prophylaxis - SCDs Xarelto (rivaroxaban) daily prior to admission, now on heparin IV Therapy recommendations:  pending Disposition:  pending  Bradycardia and Hypotension Dopamine d/c'd 1/10 after 25 beat run of VT  Atropine used for bradycardia 1/10 Norepinephrine  Vasopressin  PICC line ordered for access BP goal 120-140  Avoid  low BP  Respiratory Failure Bipap to 4LNC  Atrial fibrillation  Home medications: Amiodarone 50mg  daily and Xarelto 20mg  Currently bradycardic  On heparin IV.   Hyperlipidemia Home meds:  None, resumed in hospital LDL pending., goal < 70 Continue statin at discharge  Diabetes type II Controlled Home meds:  None HgbA1c pending., goal < 7.0 CBGs SSI  Other Stroke Risk Factors Advanced Age >/= 9  Former Cigarette smoker,advised to stop smoking Hx stroke/TIA 2009- Hemorrhagic stroke where her only preceding sx was severe HA, no residual sx. Coronary artery disease 2002- MI with stent placement c/b pulmonary emboli 2015- triple bypass cardiac surgery. Migraines Obstructive sleep apnea, on CPAP at home Congestive heart failure  Other Active Problems Hyperkalemia 5.6-> 3.9- resolved Leukocytosis WBC 45.3 On zosyn Pulmonary fibrosis- [facility]  Upper lobe predominant emphysema  Residual from COVID in 2021 Ureteral Stone 1/9 found to have 7 mm left ureteral stone.  Cystoscopy with left ureteral stent placement and left retrograde pyelogram.  Hospital day # 1  Patient seen and examined by NP/APP with MD. MD to update note as needed.   Janine Ores, DNP, FNP-BC [facility] Pager: [phone]   To contact Stroke  Continuity provider, please refer to [email or web address] After hours, contact General Neurology

## 2021-05-02 NOTE — Progress Notes (Signed)
PHARMACY - PHYSICIAN COMMUNICATION CRITICAL VALUE ALERT - BLOOD CULTURE IDENTIFICATION (BCID)  Jenny Kelly is an 76 y.o. female who presented to [facility] on 05/01/2021 with a chief complaint of CODE stroke.  Assessment:  Pt with Ecoli bacteremia (suspected source: urine).  Name of physician (or Provider) Contacted: Dr. Coy Saunas  Current antibiotics: Zosyn  Changes to prescribed antibiotics recommended:  Narrow Zosyn to Rocephin 2gm IV q24h  Results for orders placed or performed during the hospital encounter of 05/01/21  Blood Culture ID Panel (Reflexed) (Collected: 05/01/2021 11:22 PM)  Result Value Ref Range   Enterococcus faecalis NOT DETECTED NOT DETECTED   Enterococcus Faecium NOT DETECTED NOT DETECTED   Listeria monocytogenes NOT DETECTED NOT DETECTED   Staphylococcus species NOT DETECTED NOT DETECTED   Staphylococcus aureus (BCID) NOT DETECTED NOT DETECTED   Staphylococcus epidermidis NOT DETECTED NOT DETECTED   Staphylococcus lugdunensis NOT DETECTED NOT DETECTED   Streptococcus species NOT DETECTED NOT DETECTED   Streptococcus agalactiae NOT DETECTED NOT DETECTED   Streptococcus pneumoniae NOT DETECTED NOT DETECTED   Streptococcus pyogenes NOT DETECTED NOT DETECTED   A.calcoaceticus-baumannii NOT DETECTED NOT DETECTED   Bacteroides fragilis NOT DETECTED NOT DETECTED   Enterobacterales DETECTED (A) NOT DETECTED   Enterobacter cloacae complex NOT DETECTED NOT DETECTED   Escherichia coli DETECTED (A) NOT DETECTED   Klebsiella aerogenes NOT DETECTED NOT DETECTED   Klebsiella oxytoca NOT DETECTED NOT DETECTED   Klebsiella pneumoniae NOT DETECTED NOT DETECTED   Proteus species NOT DETECTED NOT DETECTED   Salmonella species NOT DETECTED NOT DETECTED   Serratia marcescens NOT DETECTED NOT DETECTED   Haemophilus influenzae NOT DETECTED NOT DETECTED   Neisseria meningitidis NOT DETECTED NOT DETECTED   Pseudomonas aeruginosa NOT DETECTED NOT DETECTED   Stenotrophomonas  maltophilia NOT DETECTED NOT DETECTED   Candida albicans NOT DETECTED NOT DETECTED   Candida auris NOT DETECTED NOT DETECTED   Candida glabrata NOT DETECTED NOT DETECTED   Candida krusei NOT DETECTED NOT DETECTED   Candida parapsilosis NOT DETECTED NOT DETECTED   Candida tropicalis NOT DETECTED NOT DETECTED   Cryptococcus neoformans/gattii NOT DETECTED NOT DETECTED   CTX-M ESBL NOT DETECTED NOT DETECTED   Carbapenem resistance IMP NOT DETECTED NOT DETECTED   Carbapenem resistance KPC NOT DETECTED NOT DETECTED   Carbapenem resistance NDM NOT DETECTED NOT DETECTED   Carbapenem resist OXA 48 LIKE NOT DETECTED NOT DETECTED   Carbapenem resistance VIM NOT DETECTED NOT DETECTED    Sherlon Handing, PharmD, BCPS Please see amion for complete clinical pharmacist phone list 05/02/2021  5:43 PM

## 2021-05-02 NOTE — Progress Notes (Signed)
RT removed BIPAP and placed on stanby at bedside. RT placed patient on 4L [place], spo2 99%. No respiratory distress noted. RT will monitor as needed.

## 2021-05-02 NOTE — Progress Notes (Addendum)
PCCM Progress Note  Called to bedside by Elink for 25 beat run of VT. Dopamine was titrated up to 10 mcg/kg/min at the time of event. Discontinued when VT occurred. Did not lose pulses. Patient is partial code with no chest compressions or shocks. OK for meds and intubation if indicated.  On exam, patient no longer having chest pain and AAO x 4.  Telemetry and labs reviewed. K previously 5.6 which has normalized. Mag low 1.3. LA improving from 6.7>4.6. Hg 12>11 on heparin gtt  A/P Tachyarrhythmia secondary to dopamine --Discontinued dopamine --Mg repletion --Telemtry  Septic shock secondary to suspected ?aspiration/UTI Lactic acidosis --1L LR bolus now --MAP goal >60. On peripheral levophed. Add vasopressin --Arterial line for BP monitoring  --Zosyn. De-escalate pending culture data  06:23 AM Atropine 1 mg given for HR 40s with improved HR to 90s and improved BP  Mechele Collin, M.D. [facility] Pulmonary/Critical Care Medicine 05/02/2021 6:24 AM   See Amion for personal pager For hours between 7 PM to 7 AM, please call Elink for urgent questions

## 2021-05-02 NOTE — Progress Notes (Signed)
Per Rn TBD 05/03/21 concerned about BP.

## 2021-05-02 NOTE — Progress Notes (Signed)
[facility] Progress Note Patient Name: Jenny Kelly DOB: 1945-12-20 MRN: [id number]   Date of Service  05/02/2021  HPI/Events of Note  Patient developed transient atrial fibrillation with RVR on Dopamine, along with chest discomfort,  but rhythm reverted to sinus rhythm and chest pain resolved with discontinuation of Dopamine. Repeat EKG changes are unimpressive for ischemia.  eICU Interventions  Dopamine discontinued.        Kerry Kass Sakiya Stepka 05/02/2021, 5:27 AM

## 2021-05-02 NOTE — Progress Notes (Signed)
°  Echocardiogram 2D Echocardiogram has been performed.  Roosvelt Maser F 05/02/2021, 11:35 AM

## 2021-05-02 NOTE — Progress Notes (Signed)
Dr. Erlinda Hong confirmed SBP goal of 120-140

## 2021-05-02 NOTE — Progress Notes (Signed)
eLink Physician-Brief Progress Note Patient Name: Jenny Kelly DOB: [birth year] MRN: [id number]   Date of Service  05/02/2021  HPI/Events of Note  Mg++ 1.3  eICU Interventions  Magnesium replaced per electrolyte replacement protocol.        Thomasene Lot Ahna Konkle 05/02/2021, 1:37 AM

## 2021-05-02 NOTE — Progress Notes (Signed)
°  Transition of Care [facility]) Screening Note   Patient Details  Name: Jenny Kelly Date of Birth: 1945/06/26   Transition of Care [facility]) CM/SW Contact:    Glennon Mac, RN Phone Number: 05/02/2021, 5:26 PM    Transition of Care Department [facility]) has reviewed patient and no TOC needs have been identified at this time. We will continue to monitor patient advancement through interdisciplinary progression rounds. If new patient transition needs arise, please place a TOC consult.   Quintella Baton, RN, BSN  Trauma/Neuro ICU Case Manager [phone]

## 2021-05-02 NOTE — Progress Notes (Incomplete)
STUDENT FOLLOW-UP NOTES - DISREGARD      CC/HPI: CVA  PMH: CKD3, DM, HLD, CAD s/p CABG, AF on xarelto, MI, ILD, hemorrhagic stroke, hypothyroidism, tremor, memory deficit, sleep apnea  PLAN: F/u diarrhea (Kayealate given 1/10), imodium added (chronic diarrhea) F/u heparin/aPTT level (HL 0.3-0.5:aPTT 66-85) (increase to 1150 per subtherapeutic lab); Hgb, Plts trending down -F/u switch to Xarelto/Eliquis,  -replete K 40 mEq x 2, recheck BMP, (K > 4, Mg > 2) (K 3.4, Mg 2.3) -continue home medications? Heart burn? Nerve pain? -Holding amio, HR in 60-70s  Anticoag: Xarelto PTA last given 1/8 1300 (11-13h half-life); Heparin 0.59, aPTT 61 (goal 0.3-0.5; aPTT 66-85) 1/11 heparin 1050 units, f/u HL and aPTT, f/u DOAC vs Warfarin pending MRI 1/12 heparin level 0.15, subtherapeutic; keep on heparin per now for CCM  ID: Day 3/7 abx, WBC 45.3>>31.2, afebrile, Scr 1.36, CrCl 32, Lactate 4.6 E. Coli bacteremia secondary to complicated UTI Cipro, sulfa, azithromycin allergies  1/12 WBC 31.2 >> 15 Antimicrobials this admission: Zosyn 3.375g q8h over 4h 1/9 >> 1/10 Ceftriaxone 2g q24h 1/10 >> 1/12 Cefazolin 2g q8h 1/12 >>   Microbiology results: 1/9 BCx: E. Coli + enterobacterales 1/9 UCx: < 10,000 colonies 1/9 Resp: negative 1/10 MRSA PCR: negative  CV: CHADSVASC 8, MAP 70s, BP goal 120-180, amio PTA, bradycardic at baseline, cardiology following; LDL 98 -midodrine added yesterday, became brady, stopped, no new additions for now -off pressors, topamax+aricept = brady; CCM thinks possible foley etiology for brady -back on norepi in the morning but off now   Endocrine: A1c 5.9, Glucose controlled  GI/Nutrition: Omeprazole PTA, miralax, docusate, developed diarrhea and given loperamide -diarrhea chronic  Neuro: M1/M2 occlusion, no mechanical thrombectomy, not given TNK, pain post cytoscopy controlled with PRN tylenol -- reactivation of stroke d/t sepsis Pain score 1-9, still  complaining of suprapubic pain. Tramadol PRN? 1/12 CPOT 0, f/u CXR for suprapubic pain  Renal: SCr 1.83 >> 1.48 (unk baseline), Mg 1.3 (4g given, 2g pend), K 5.6 >> 4.2; upper ureteral stone, s/p stent,  Keyexalate given, urology following 1/11 Scr 1.36 >> 1.14, K 3.9 >> 3.0, Mg 1.3 >> 3.5, f/u Mg level, replete K -given kayexalate 1/10, K 4.2 at the time 1/12 Keep K > 4 and Mg > 2.0 per cards; f/u K and Mg labs; foley removed; K 3.4 given 40 x 2, Mg 2.3  Pulm: ILD, [place] at 4L; singular; CO2 19 >> 20  Heme/Onc: Plts 173 >> 127, Hgb 9.0 >> 8.8  PTA Med Issues: B12, Vit D2 50000 weekly, Mucinex PRN, Xarelto 20mg  QPM  Best Practices:

## 2021-05-02 NOTE — Procedures (Signed)
Arterial Catheter Insertion Procedure Note  TWYLIA OKA  [id number]  Feb 03, 1946  Date:05/02/21  Time:6:21 AM    Provider Performing: Hiram Comber    Procedure: Insertion of Arterial Line ([id number]) without US guidance  Indication(s) Blood pressure monitoring and/or need for frequent ABGs  Consent Risks of the procedure as well as the alternatives and risks of each were explained to the patient and/or caregiver.  Consent for the procedure was obtained and is signed in the bedside chart  Anesthesia None   Time Out Verified patient identification, verified procedure, site/side was marked, verified correct patient position, special equipment/implants available, medications/allergies/relevant history reviewed, required imaging and test results available.   Sterile Technique Maximal sterile technique including full sterile barrier drape, hand hygiene, sterile gown, sterile gloves, mask, hair covering, sterile ultrasound probe cover (if used).   Procedure Description Area of catheter insertion was cleaned with chlorhexidine and draped in sterile fashion. With real-time ultrasound guidance an arterial catheter was placed into the left radial artery.  Appropriate arterial tracings confirmed on monitor.     Complications/Tolerance None; patient tolerated the procedure well.   EBL Minimal   Specimen(s) None

## 2021-05-02 NOTE — Progress Notes (Signed)
eLink Physician-Brief Progress Note Patient Name: Jenny Kelly DOB: [birth year] MRN: [id number]   Date of Service  05/02/2021  HPI/Events of Note  Patient complaining of perineal pain post cystoscopy. HR 30s, BP stable  eICU Interventions  Tylenol prn ordered     Intervention Category Minor Interventions: Routine modifications to care plan (e.g. PRN medications for pain, fever)  Shona Needles [name] 05/02/2021, 11:09 PM

## 2021-05-02 NOTE — Consult Note (Addendum)
Cardiology Consultation:   Patient ID: Jenny Kelly MRN: [id number]; DOB: 11-Sep-1945  Admit date: 05/01/2021 Date of Consult: 05/02/2021  PCP:  Crist Fat, MD   [facility] Providers Cardiologist:  Previously followed in [place]   Patient Profile:   Jenny Kelly is a 76 y.o. female with a hx of CAD s/p CABG x 3, CKD stage III, CVA, hyperlipidemia, PE, atrial fibrillation, DM, seizures, thyroid disease, and COPD who is being seen 05/02/2021 for the evaluation of atrial fibrillation at the request of Dr. Denese Killings.  History of Present Illness:   Jenny Kelly has had longstanding atrial fibrillation anticoagulated with Xarelto.  She has CAD with prior PCI, stent placed in 2002 complicated by PE.  She had a brain hemorrhage in 2009.  She underwent heart catheterization leading to CABG x3 in 2015 at [facility].  She has been seen by Dr. Tomie China, Dr. Bing Matter, and Dr. Dulce Sellar in the past.  She follows with [facility] pulmonology for COPD.  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  She was BIB via EMS transfer from [facility] for sepsis and code stroke on 05/01/2021.  She presented with left hemiparesis.  CT angiogram showed an acute M1 and M2 occlusion.  She was also having symptoms suggestive of UTI.  Due to concern for sepsis, she received several fluid boluses in route with EMS and subsequently went into respiratory distress.  There was question of aspiration with EMS.  She decompensated and was hypoxic in the 70s requiring BiPAP immediately on arrival to [facility] ED.  She was hypotensive in the 80s.  Neurology called to bedside to evaluate for possible mechanical thrombectomy.  CT PE revealed a 13 mL infarct core involving the right MCA territory.  He was determined not a candidate for thrombectomy by neuro interventional radiologist.  In addition, she was found to have a left 7 mm upper ureteral kidney stone.  Due to concern for sepsis secondary to obstructing  proximal 7 mm left ureteral stone, she underwent cystoscopy with ureteral stent placement last evening with Dr. Liliane Shi.  She was hypotensive overnight and was started on norepinephrine drip.  She remained hypotensive with MAP less than 55 and was then started on dopamine drip.  She then had VT and chest pain, dopamine was discontinued with resolution of her tachycardia.  She did not lose pulses.  Midodrine was started in addition to norepinephrine and vasopressin.  Amiodarone on hold.  Cardiology consulted for A. Fib and bradycardia.   Appears she was on 50 mg amiodarone at home, I do not see BB listed in her home medications. She tells me she usually "runs slow." She sometimes feels her "heart skip." She has not missed a dose of xarelto.  Telemetry review appears to be sinus, although p waves difficult to discern, QRS is narrow and marches out. She became sinus bradycardic in the 40s and dopamine was started. She deteriorated into what appears to be a wide complex tachycardia. Rhythm appears VT, but this was sustained for approximately 5 minutes and she was not hemodynamically unstable. EF is normal. Question if this was Afib RVR with a rate related bundle. Dopamine was discontinued and she drifted back down to SB in the 40s. She received atropine at 0620 with subsequent increase in hear rate. She is now sinus bradycardic again in the 40s, but appears asymptomatic. Home amiodarone is on hold. Midodrine was ordered to help with pressure support, but not continued due to bradycardia.  Echocardiogram today showed preserved EF, DD, and no significant valvular disease.    Past Medical History:  Diagnosis Date   Allergy    Anemia    Anxiety    Arthritis    Asthma    Atrial fibrillation (HCC)    Blood transfusion without reported diagnosis    Cataract    CHF (congestive heart failure) (HCC)    Chronic kidney disease    Clotting disorder (HCC)    COPD (chronic obstructive pulmonary disease) (HCC)     Depression    Diabetes mellitus without complication (HCC)    GERD (gastroesophageal reflux disease)    Heart murmur    Hyperlipidemia    Myocardial infarction (HCC)    Neuromuscular disorder (HCC)    tremors   Seizures (HCC)    Sleep apnea    Stroke (HCC)    Thyroid disease     Past Surgical History:  Procedure Laterality Date   ABDOMINAL HYSTERECTOMY     APPENDECTOMY     bladder tack     CHOLECYSTECTOMY     CORONARY ARTERY BYPASS GRAFT     CYSTOSCOPY W/ URETERAL STENT PLACEMENT Left 05/01/2021   Procedure: CYSTOSCOPY WITH RETROGRADE PYELOGRAM/URETERAL STENT PLACEMENT;  Surgeon: Rene PaciWinter, Christopher Aaron, MD;  [facility];  Service: Urology;  Laterality: Left;   ESOPHAGOGASTRODUODENOSCOPY ENDOSCOPY     EYE SURGERY     FRACTURE SURGERY Right    3rd finger   JOINT REPLACEMENT Bilateral    knees   TONSILLECTOMY AND ADENOIDECTOMY     TUBAL LIGATION       Home Medications:  Prior to Admission medications   Medication Sig Start Date End Date Taking? Authorizing Provider  amiodarone (PACERONE) 100 MG tablet Take 50 mg by mouth at bedtime.   Yes [provider]  Cyanocobalamin (B-12 PO) Take by mouth.   Yes [provider]  donepezil (ARICEPT) 10 MG tablet Take 10 mg by mouth at bedtime.   Yes [provider]  ergocalciferol (VITAMIN D2) 1.25 MG (50000 UT) capsule Take 50,000 Units by mouth See admin instructions. Takes twice a week on Wednesday and Saturday   Yes [provider]  guaiFENesin (MUCINEX PO) Take 1 tablet by mouth as needed (cough, congestion).   Yes [provider]  levothyroxine (SYNTHROID) 88 MCG tablet Take 88 mcg by mouth daily before breakfast.   Yes [provider]  montelukast (SINGULAIR) 10 MG tablet Take 10 mg by mouth at bedtime. 04/30/21  Yes [provider]  omeprazole (PRILOSEC) 40 MG capsule Take 40 mg by mouth daily. 03/15/20  Yes [provider]  Phenazopyridine HCl (URISTAT  PO) Take 1 tablet by mouth as needed (UTI).   Yes [provider]  pregabalin (LYRICA) 50 MG capsule Take 50 mg by mouth 2 (two) times daily.   Yes [provider]  rivaroxaban (XARELTO) 20 MG TABS tablet Take 20 mg by mouth every evening.   Yes [provider]  topiramate (TOPAMAX) 200 MG tablet Take 200 mg by mouth 2 (two) times daily.   Yes [provider]  albuterol (PROVENTIL HFA) 108 (90 Base) MCG/ACT inhaler Inhale two puffs every 4-6 hours if needed for coughing, wheezing, or shortness of breath. Patient not taking: Reported on 05/01/2021 12/21/20   Jessica PriestKozlow, Eric J, MD  azelastine (ASTELIN) 0.1 % nasal spray Use one spray in each nostril twice daily Patient not taking: Reported on 05/01/2021 12/21/20   Jessica PriestKozlow, Eric J, MD  budesonide (PULMICORT) 0.5  MG/2ML nebulizer solution Take 2 mLs (0.5 mg total) by nebulization in the morning and at bedtime. Patient not taking: Reported on 05/01/2021 01/18/21   Jiles Prows, MD  famotidine (PEPCID) 40 MG tablet Take one tablet once daily Patient not taking: Reported on 05/01/2021 12/21/20   Jiles Prows, MD  fluticasone (FLONASE) 50 MCG/ACT nasal spray SPRAY 1 SPRAY INTO BOTH NOSTRILS DAILY. Patient not taking: Reported on 05/01/2021 08/02/20   Freddi Starr, MD  Fluticasone-Umeclidin-Vilant (TRELEGY ELLIPTA) 100-62.5-25 MCG/INH AEPB Inhale 1 puff into the lungs daily. Patient not taking: Reported on 05/01/2021 12/13/20   Freddi Starr, MD  ipratropium (ATROVENT) 0.03 % nasal spray SPRAY 2 SPRAYS INTO BOTH NOSTRILS EVERY 12 HOURS. Patient not taking: Reported on 05/01/2021 04/10/21   Freddi Starr, MD    Inpatient Medications: Scheduled Meds:  amiodarone  50 mg Oral Daily   Chlorhexidine Gluconate Cloth  6 each Topical Daily   donepezil  10 mg Oral QHS   levothyroxine  88 mcg Oral Q0600   montelukast  10 mg Oral QHS   nitroGLYCERIN       Continuous Infusions:  sodium chloride 20 mL/hr at 05/02/21 1500    sodium chloride     heparin 1,050 Units/hr (05/02/21 1513)   norepinephrine (LEVOPHED) Adult infusion 8 mcg/min (05/02/21 1500)   piperacillin-tazobactam (ZOSYN)  IV Stopped (05/02/21 1403)   vasopressin 0.03 Units/min (05/02/21 1500)   PRN Meds: Place/Maintain arterial line **AND** sodium chloride, docusate sodium, polyethylene glycol  Allergies:    Allergies  Allergen Reactions   Ciprofloxacin Hives    Ask patient   Prochlorperazine Edisylate Other (See Comments)    Cant swallow Muscle cramping   Prednisone    Reduced Iso-Alpha Acids Complex    Statins Other (See Comments)    Muscle weakness    Sulfa Antibiotics Swelling   Azithromycin Rash   Latex Rash    Social History:   Social History   Socioeconomic History   Marital status: Married    Spouse name: Ulice Dash   Number of children: 3   Years of education: Not on file   Highest education level: Not on file  Occupational History   Not on file  Tobacco Use   Smoking status: Former    Packs/day: 0.50    Years: 30.00    Pack years: 15.00    Types: Cigarettes    Quit date: 06/26/2000    Years since quitting: 20.8   Smokeless tobacco: Never  Vaping Use   Vaping Use: Former  Substance and Sexual Activity   Alcohol use: Not Currently   Drug use: Never   Sexual activity: Not on file  Other Topics Concern   Not on file  Social History Narrative   Not on file   Social Determinants of Health   Financial Resource Strain: Not on file  Food Insecurity: Not on file  Transportation Needs: Not on file  Physical Activity: Not on file  Stress: Not on file  Social Connections: Not on file  Intimate Partner Violence: Not on file    Family History:    Family History  Family history unknown: Yes     ROS:  Please see the history of present illness.   All other ROS reviewed and negative.     Physical Exam/Data:   Vitals:   05/02/21 1330 05/02/21 1400 05/02/21 1430 05/02/21 1530  BP: (!) 137/36  130/60 (!) 137/47   Pulse: (!) 44 (!) 44 (!) 39 (!) 39  Resp: 18 18 17 17   Temp:      TempSrc:      SpO2: 100% 100% 100% 99%  Weight:      Height:        Intake/Output Summary (Last 24 hours) at 05/02/2021 1633 Last data filed at 05/02/2021 1500 Gross per 24 hour  Intake 2890.85 ml  Output 1200 ml  Net 1690.85 ml   Last 3 Weights 05/01/2021 12/21/2020 12/13/2020  Weight (lbs) 138 lb 14.2 oz 147 lb 147 lb 3.2 oz  Weight (kg) 63 kg 66.679 kg 66.769 kg     Body mass index is 26.24 kg/m.  General:  Well nourished, well developed, in no acute distress HEENT: normal Neck: no JVD Vascular: No carotid bruits; Distal pulses 2+ bilaterally Cardiac:  normal S1, S2; RRR; no murmur  Lungs:  clear to auscultation bilaterally, no wheezing, rhonchi or rales  Abd: soft, nontender, no hepatomegaly  Ext: no edema Musculoskeletal:  No deformities, BUE and BLE strength normal and equal Skin: warm and dry  Neuro:  left facial droop Psych:  Normal affect   EKG:  The EKG was personally reviewed and demonstrates:   Telemetry:  Telemetry was personally reviewed and demonstrates:  sinus bradycardia --> wide complex tachycardia x 5 min --> sinus bradycardia  Relevant CV Studies:  Echo 05/02/21: 1. Left ventricular ejection fraction, by estimation, is 60 to 65%. The  left ventricle has normal function. The left ventricle has no regional  wall motion abnormalities. Left ventricular diastolic parameters are  consistent with Grade II diastolic  dysfunction (pseudonormalization). Elevated left atrial pressure.   2. Right ventricular systolic function is normal. The right ventricular  size is normal. There is normal pulmonary artery systolic pressure.   3. The mitral valve is normal in structure. No evidence of mitral valve  regurgitation. No evidence of mitral stenosis.   4. The aortic valve is normal in structure. Aortic valve regurgitation is  trivial. No aortic stenosis is present.   5. The inferior vena cava is normal  in size with <50% respiratory  variability, suggesting right atrial pressure of 8 mmHg.    Laboratory Data:  High Sensitivity Troponin:   Recent Labs  Lab 05/01/21 1558  TROPONINIHS 24*     Chemistry Recent Labs  Lab 05/01/21 1558 05/01/21 2322 05/02/21 0910  NA 136 137 137  K 5.6* 4.2 3.9  CL 107 109 111  CO2 17* 18* 19*  GLUCOSE 134* 142* 185*  BUN 18 19 20   CREATININE 1.76* 1.83* 1.48*  CALCIUM 7.8* 8.0* 7.3*  MG  --  1.3*  --   GFRNONAA 30* 28* 37*  ANIONGAP 12 10 7     Recent Labs  Lab 05/01/21 1558 05/02/21 0910  PROT 5.7* 5.1*  ALBUMIN 2.2* 1.9*  AST 60* 22  ALT 9 9  ALKPHOS 74 47  BILITOT 1.7* 0.5   Lipids No results for input(s): CHOL, TRIG, HDL, LABVLDL, LDLCALC, CHOLHDL in the last 168 hours.  Hematology Recent Labs  Lab 05/01/21 1558 05/01/21 2322 05/02/21 0910  WBC 7.2 39.5* 45.3*  RBC 4.47 4.02 3.61*  HGB 12.3 11.1* 10.0*  HCT 42.2 36.6 32.2*  MCV 94.4 91.0 89.2  MCH 27.5 27.6 27.7  MCHC 29.1* 30.3 31.1  RDW 19.9* 19.8* 19.9*  PLT 133* 168 224   Thyroid  Recent Labs  Lab 05/02/21 0646  TSH 0.784    BNP Recent Labs  Lab 05/01/21 1558  BNP 493.7*    DDimer No results  for input(s): DDIMER in the last 168 hours.   Radiology/Studies:  CT CEREBRAL PERFUSION W CONTRAST  Result Date: 05/01/2021 EXAM: CT PERFUSION BRAIN TECHNIQUE: Multiphase CT imaging of the brain was performed following IV bolus contrast injection. Subsequent parametric perfusion maps were calculated using RAPID software. CONTRAST:  48mL OMNIPAQUE IOHEXOL 350 MG/ML SOLN COMPARISON:  None. FINDINGS: CT Brain Perfusion Findings: CBF (<30%) Volume: 5mL Perfusion (Tmax>6.0s) volume: 64mL Mismatch Volume: 1mL ASPECTS on noncontrast CT Head: 10 at 1:43 p.m. today. Infarct Core: 13 mL Infarction Location:Right MCA territory, in the right posterior temporal, posterior frontal, and anterior parietal lobes. IMPRESSION: 13 mL infarct core, involving the right MCA territory in  the right posterior temporal, posterior frontal, and anterior parietal lobes. Electronically Signed   By: Merilyn Baba M.D.   On: 05/01/2021 16:56   DG Chest Port 1 View  Result Date: 05/02/2021 CLINICAL DATA:  PICC line placement. EXAM: PORTABLE CHEST 1 VIEW COMPARISON:  Same day. FINDINGS: Stable cardiomediastinal silhouette. Interval placement of right-sided PICC line with distal tip in expected position of the SVC. IMPRESSION: Interval placement of right-sided PICC line with distal tip in expected position of the SVC. Electronically Signed   By: Marijo Conception M.D.   On: 05/02/2021 16:26   DG Chest Port 1 View  Result Date: 05/02/2021 CLINICAL DATA:  Respiratory failure, pulmonary edema EXAM: PORTABLE CHEST 1 VIEW COMPARISON:  Chest radiograph 05/01/2021 FINDINGS: Median sternotomy wires and mediastinal surgical clips are stable. The cardiomediastinal silhouette is stable. Coarse reticular markings throughout both lungs are again seen, unchanged. There is no new or worsening focal airspace disease. There is no pleural effusion or pneumothorax. The bones are stable. IMPRESSION: Stable chest with no radiographic evidence of acute cardiopulmonary process. Electronically Signed   By: Valetta Mole M.D.   On: 05/02/2021 08:15   DG Chest Port 1 View  Result Date: 05/01/2021 CLINICAL DATA:  Shortness of breath EXAM: PORTABLE CHEST 1 VIEW COMPARISON:  06/20/2020 chest CT, radiograph 05/01/2021, 03/19/2021, 03/24/2020 FINDINGS: Diffuse bilateral reticular opacity consistent with chronic interstitial lung disease. No confluent acute airspace disease, pleural effusion or pneumothorax. Stable cardiomediastinal silhouette. Post sternotomy changes. Aortic atherosclerosis. IMPRESSION: Chronic interstitial lung disease without acute superimposed airspace disease Electronically Signed   By: Donavan Foil M.D.   On: 05/01/2021 17:36   DG C-Arm 1-60 Min  Result Date: 05/01/2021 CLINICAL DATA:  Cystoscopy with  retrograde pyelogram EXAM: DG C-ARM 1-60 MIN FLUOROSCOPY TIME:  Fluoroscopy Time:  14 seconds Radiation Exposure Index (if provided by the fluoroscopic device): 3.49 mGy Number of Acquired Spot Images: 0 COMPARISON:  None. FINDINGS: Multiple images are submitted from a left retrograde pyelogram. No visible filling defect. Final images demonstrate placement of left ureteral stent. IMPRESSION: As above. Electronically Signed   By: Rolm Baptise M.D.   On: 05/01/2021 21:02   ECHOCARDIOGRAM COMPLETE  Result Date: 05/02/2021    ECHOCARDIOGRAM REPORT   Patient Name:   Jenny Kelly Date of Exam: 05/02/2021 Medical Rec #:  [id number]       Height:       61.0 in Accession #:    [id number]      Weight:       138.9 lb Date of Birth:  1945/12/21        BSA:          1.618 m Patient Age:    36 years        BP:  104/56 mmHg Patient Gender: F               HR:           46 bpm. Exam Location:  Inpatient Procedure: 2D Echo, Cardiac Doppler and Color Doppler Indications:    Stroke  History:        Patient has no prior history of Echocardiogram examinations.                 Arrythmias:Bradycardia; Signs/Symptoms:Shortness of Breath.  Sonographer:    Roosvelt Maser RDCS Referring Phys: [id number] JINDONG XU IMPRESSIONS  1. Left ventricular ejection fraction, by estimation, is 60 to 65%. The left ventricle has normal function. The left ventricle has no regional wall motion abnormalities. Left ventricular diastolic parameters are consistent with Grade II diastolic dysfunction (pseudonormalization). Elevated left atrial pressure.  2. Right ventricular systolic function is normal. The right ventricular size is normal. There is normal pulmonary artery systolic pressure.  3. The mitral valve is normal in structure. No evidence of mitral valve regurgitation. No evidence of mitral stenosis.  4. The aortic valve is normal in structure. Aortic valve regurgitation is trivial. No aortic stenosis is present.  5. The inferior vena cava is  normal in size with <50% respiratory variability, suggesting right atrial pressure of 8 mmHg. FINDINGS  Left Ventricle: Mitral inflow "U" wave is seen. Left ventricular ejection fraction, by estimation, is 60 to 65%. The left ventricle has normal function. The left ventricle has no regional wall motion abnormalities. The left ventricular internal cavity size was normal in size. There is no left ventricular hypertrophy. Left ventricular diastolic parameters are consistent with Grade II diastolic dysfunction (pseudonormalization). Elevated left atrial pressure. Right Ventricle: The right ventricular size is normal. No increase in right ventricular wall thickness. Right ventricular systolic function is normal. There is normal pulmonary artery systolic pressure. The tricuspid regurgitant velocity is 2.50 m/s, and  with an assumed right atrial pressure of 8 mmHg, the estimated right ventricular systolic pressure is 33.0 mmHg. Left Atrium: Left atrial size was normal in size. Right Atrium: Right atrial size was normal in size. Pericardium: There is no evidence of pericardial effusion. Mitral Valve: The mitral valve is normal in structure. No evidence of mitral valve regurgitation. No evidence of mitral valve stenosis. Tricuspid Valve: The tricuspid valve is normal in structure. Tricuspid valve regurgitation is trivial. No evidence of tricuspid stenosis. Aortic Valve: The aortic valve is normal in structure. Aortic valve regurgitation is trivial. No aortic stenosis is present. Aortic valve mean gradient measures 2.0 mmHg. Aortic valve peak gradient measures 4.4 mmHg. Aortic valve area, by VTI measures 1.72 cm. Pulmonic Valve: The pulmonic valve was normal in structure. Pulmonic valve regurgitation is not visualized. No evidence of pulmonic stenosis. Aorta: The aortic root is normal in size and structure. Venous: The inferior vena cava is normal in size with less than 50% respiratory variability, suggesting right atrial  pressure of 8 mmHg. IAS/Shunts: No atrial level shunt detected by color flow Doppler.  LEFT VENTRICLE PLAX 2D LVIDd:         5.60 cm   Diastology LVIDs:         3.60 cm   LV e' medial:    5.00 cm/s LV PW:         0.90 cm   LV E/e' medial:  19.0 LV IVS:        0.80 cm   LV e' lateral:   7.51 cm/s LVOT diam:  1.80 cm   LV E/e' lateral: 12.7 LV SV:         45 LV SV Index:   28 LVOT Area:     2.54 cm  RIGHT VENTRICLE            IVC RV Basal diam:  3.30 cm    IVC diam: 1.90 cm RV S prime:     8.06 cm/s TAPSE (M-mode): 1.6 cm LEFT ATRIUM             Index        RIGHT ATRIUM           Index LA diam:        3.90 cm 2.41 cm/m   RA Area:     22.30 cm LA Vol (A2C):   50.0 ml 30.91 ml/m  RA Volume:   74.70 ml  46.18 ml/m LA Vol (A4C):   44.3 ml 27.39 ml/m LA Biplane Vol: 48.6 ml 30.04 ml/m  AORTIC VALVE AV Area (Vmax):    1.74 cm AV Area (Vmean):   1.67 cm AV Area (VTI):     1.72 cm AV Vmax:           105.00 cm/s AV Vmean:          69.200 cm/s AV VTI:            0.264 m AV Peak Grad:      4.4 mmHg AV Mean Grad:      2.0 mmHg LVOT Vmax:         71.70 cm/s LVOT Vmean:        45.400 cm/s LVOT VTI:          0.178 m LVOT/AV VTI ratio: 0.67  AORTA Ao Root diam: 3.40 cm Ao Asc diam:  3.30 cm MITRAL VALVE               TRICUSPID VALVE MV Area (PHT): 5.66 cm    TR Peak grad:   25.0 mmHg MV Decel Time: 134 msec    TR Vmax:        250.00 cm/s MV E velocity: 95.20 cm/s MV A velocity: 68.40 cm/s  SHUNTS MV E/A ratio:  1.39        Systemic VTI:  0.18 m                            Systemic Diam: 1.80 cm Dani Gobble Croitoru MD Electronically signed by Sanda Klein MD Signature Date/Time: 05/02/2021/12:46:12 PM    Final    [place] EKG SITE RITE  Result Date: 05/02/2021 If Site Rite image not attached, placement could not be confirmed due to current cardiac rhythm.    Assessment and Plan:   Paroxysmal atrial fibrillation  Sinus bradycardia Wide complex tachycardia on dopamine - she is in sinus bradycardia now - hold 50 mg  amiodarone for now, but I do not think this will impact her current heart rate - avoid BB - she appears asymptomatic with this heart rate - responded to atropine - she carries a prior diagnosis of bradycardia, review of her last pulmonology office visits list HR 60-75 in vitals - bradycardia could be vagally mediated given ongoing infection and recent cystoscopy  - would avoid additional agents for now, but will have EP evaluate tomorrow   Hypotension - on vasopressin and levophed - would avoid midodrine for now - could treat with fluids since she has a normal EF   Chronic  anticoagulation Hx of Afib, CVA, and PE - was on xarelto prior to this event - she is strictly compliant - unclear if this is a xarelto failure - neurology has not completely defines her current episode: new TIA vs due to prior stroke - awaiting brain MRI - if determined to be a new event, would then need to discuss changing DOAC (eliquis) vs coumadin   CAD s/p CABG x 3 Hyperlipidemia with LDL goal < 70 - she denies chest pain, has not had PCI since CABG in 2015 - needs to establish with a cardiologist   CVA - per neurology   Risk Assessment/Risk Scores:     CHA2DS2-VASc Score = 8   This indicates a 10.8% annual risk of stroke. The patient's score is based upon: CHF History: 0 HTN History: 1 Diabetes History: 1 Stroke History: 2 Vascular Disease History: 1 Age Score: 2 Gender Score: 1   For questions or updates, please contact [facility] Please consult [email or web address] for contact info under    Signed, Ledora Bottcher, [place]  05/02/2021 4:33 PM  Patient examined chart reviewed. Discussed care with PA. Exam with patient in no distress. Left sided weakness and left facial droop. Lungs clear no murmur abdomen benign Post bilateral TKRls and no edema. Echo with normal EF. ECG with NSR normal QRS and QT. Reviewed multiple telemetry strips. Has a 5 minute episode of wide complex tachycardia with  rates in 120 range Hemodynamically stable This was while on dopamine. Appears that rate slowed to SR with narrower complex after inotrope d/c. She has also had periods of bradycardia with rates in the 38-45 bpm range. No AV block. She has had a CNS event with sepsis and uretal stent. She has had no chest pain.  Not clear that bradycardia is from vagal issues with uretal infection and CNS event and WCT from inotrope. No indication for AAT/PPM at this time Her EF is normal and can use volume to support BP. She was on xaretlo prior to CNS event and indicates compliance so not clear if this represents a DOAC failure and if she would need to change anticoagulants. Given distant history of CABG would likely benefit from outpatient lexiscan myovue This is contraindicated with recent CVA and seizure risk and could be done as outpatient with f/u Cardiology in [place].  Will have EP see and round on in am to get their input on arrhythmias. I have seen no evidence of afib in all of her ECGls and telemetry She may benefit from 30 day monitor or ILR   Jenkins Rouge MD [facility]

## 2021-05-02 NOTE — Progress Notes (Signed)
eLink Physician-Brief Progress Note Patient Name: Jenny Kelly DOB: [birth year] MRN: [id number]   Date of Service  05/02/2021  HPI/Events of Note  Patient with most recent Lactic acid of 6.7, BNP 423, patient with sepsis of urinary tract origin. K+ 5.6, creatinine 1.7.   eICU Interventions  1000 ml Normal Saline bolus ordered at 250 ml  / hour.        [name] 05/02/2021, 12:31 AM

## 2021-05-02 NOTE — H&P (Signed)
NAME:  Jenny Kelly, MRN:  [id number], DOB:  [birth year], LOS: 1 ADMISSION DATE:  05/01/2021, CONSULTATION DATE:  05/01/21 REFERRING MD:  Cheral Marker CHIEF COMPLAINT:  AMS   History of Present Illness:  Jenny Kelly is a 76 y.o. female who has a PMH as outlined below including A. fib (on Xarelto).  She presented as transfer from [place] on 1/9 with acute left hemiplegia.  She apparently had dysuria 3 days prior Friday 1/6 and took AZO with improvement in symptoms by Sunday 1/8.  Morning of 1/9, she had generalized weakness and left-sided hemiplegia.  She was taken to [facility] as a code stroke.  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  She was then transferred to [facility] for further evaluation and management.  In route to ED, she received multiple fluid boluses for concern of UTI/sepsis.  She then developed dyspnea and respiratory distress and was placed on BiPAP.  There was also concern that she might have had an aspiration event prior to being placed on BiPAP.  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  She was evaluated by neurology who did not feel that she was a candidate for any intervention.  She was subsequently admitted to the neuro ICU for ongoing management.  Pertinent  Medical History:  has CKD (chronic kidney disease), stage III ([place]); Polypharmacy; Post-concussion headache; Type 2 diabetes mellitus without complication ([place]); Hypothyroidism; Hyperlipidemia; Coronary artery disease involving native coronary artery of native heart; Atrial fibrillation ([place]); Memory deficit; Risk for falls; S/P CABG (coronary artery bypass graft); Tremor; Stroke [facility]); Sepsis with acute hypoxic respiratory failure without septic shock ([place]); Hyperkalemia; and AKI (acute kidney injury) ([place]) on their problem list.  Significant Hospital Events: Including procedures, antibiotic start and stop dates in addition to other  pertinent events   1/9 transfer from [facility] as code stroke but CT perfusion shows completed right frontoparietal infarct. 1/9 found to have 7 mm left ureteral stone.  Cystoscopy with left ureteral stent placement and left retrograde pyelogram.  Interim History / Subjective:   Much better following stent placement yesterday.  Did have episodes of bradycardia requiring dopamine.  Objective:  Blood pressure (!) 101/45, pulse (!) 57, temperature 97.7 F (36.5 C), temperature source Oral, resp. rate 17, height 5\' 1"  (1.549 m), weight 63 kg, SpO2 99 %.        Intake/Output Summary (Last 24 hours) at 05/02/2021 1030 Last data filed at 05/02/2021 0900 Gross per 24 hour  Intake 2462.1 ml  Output 700 ml  Net 1762.1 ml   Filed Weights   05/01/21 1821  Weight: 63 kg    Examination: General: Elderly female, resting in bed, in NAD.  On peripheral vasopressin and norepinephrine. Neuro: Awake and follows commands.  Left-sided weakness which patient states is chronic and somewhat worse than baseline. HEENT: [place]/AT. Sclerae anicteric.  On room air Cardiovascular: IRIR, no M/R/G.  Lungs: Respirations even and unlabored.   Abdomen: CVA tenderness. Musculoskeletal: No gross deformities, no edema.  Skin: Intact, warm, no rashes  Labs/imaging personally reviewed:  CT head/CTA 1/9 (per report) > right M1 and M2 occlusion. CTP 1/9 > 13 mL core infarct small area of penumbra Improving creatinine to 1.48 Rising leukocytosis to 45.3  Assessment & Plan:   Critically ill due to septic shock caused by pyelonephritis from obstructing left renal calculus Nephrolithiasis status post left ureteric stenting Reactivation  of prior right MCA stroke symptoms Resolving acute lung injury from sepsis Coronary artery disease with prior history of CHF History of atrial fibrillation on Xarelto and amiodarone Dyslipidemia by history Hypertension by history History of hypothyroidism History of  seizures History of depression AKI  Plan:  -Improving hemodynamics.  Wean vasopressors starting with vasopressin.  If continues to require vasopressors throughout the day, will need PICC line. -Continue current antibiotics and consider reimaging if leukocytosis and vasopressor requirements persist beyond the next 24 hours. -Wean oxygen as tolerated. -Vagal stimulation from intra-abdominal infection.  Will hold amiodarone.  Continue norepinephrine to help sustain heart rate. -Elective stroke work-up including MRI although acute stroke unlikely. - On heparin for Afib.   Best practice (evaluated daily):  Diet/type: Oral diet DVT prophylaxis: systemic heparin GI prophylaxis: N/A Lines: Needs PICC Foley: Foley catheter in place Code Status:  limited - no CPR or defibs.  Short term intubation OK.  ACLS meds OK. Last date of multidisciplinary goals of care discussion: None yet.  CRITICAL CARE Performed by: Kipp Brood   Total critical care time: 40 minutes  Critical care time was exclusive of separately billable procedures and treating other patients.  Critical care was necessary to treat or prevent imminent or life-threatening deterioration.  Critical care was time spent personally by me on the following activities: development of treatment plan with patient and/or surrogate as well as nursing, discussions with consultants, evaluation of patient's response to treatment, examination of patient, obtaining history from patient or surrogate, ordering and performing treatments and interventions, ordering and review of laboratory studies, ordering and review of radiographic studies, pulse oximetry, re-evaluation of patient's condition and participation in multidisciplinary rounds.  Kipp Brood, MD [facility] ICU Physician [place]  Pager: [phone] Mobile: [phone] After hours: [phone].

## 2021-05-03 ENCOUNTER — Inpatient Hospital Stay (HOSPITAL_COMMUNITY): Payer: Medicare HMO

## 2021-05-03 DIAGNOSIS — I480 Paroxysmal atrial fibrillation: Secondary | ICD-10-CM | POA: Diagnosis not present

## 2021-05-03 DIAGNOSIS — R001 Bradycardia, unspecified: Secondary | ICD-10-CM | POA: Diagnosis not present

## 2021-05-03 DIAGNOSIS — I63511 Cerebral infarction due to unspecified occlusion or stenosis of right middle cerebral artery: Secondary | ICD-10-CM | POA: Diagnosis not present

## 2021-05-03 DIAGNOSIS — R000 Tachycardia, unspecified: Secondary | ICD-10-CM | POA: Diagnosis not present

## 2021-05-03 DIAGNOSIS — I63311 Cerebral infarction due to thrombosis of right middle cerebral artery: Secondary | ICD-10-CM | POA: Diagnosis not present

## 2021-05-03 DIAGNOSIS — I639 Cerebral infarction, unspecified: Secondary | ICD-10-CM | POA: Diagnosis not present

## 2021-05-03 LAB — LIPID PANEL
Cholesterol: 148 mg/dL (ref 0–200)
HDL: 24 mg/dL — ABNORMAL LOW (ref >40)
LDL Cholesterol: 98 mg/dL (ref 0–99)
Total CHOL/HDL Ratio: 6.2 RATIO
Triglycerides: 132 mg/dL (ref <150)
VLDL: 26 mg/dL (ref 0–40)

## 2021-05-03 LAB — CBC WITH DIFFERENTIAL/PLATELET
Abs Immature Granulocytes: 0 10*3/uL (ref 0.00–0.07)
Basophils Absolute: 0 10*3/uL (ref 0.0–0.1)
Basophils Relative: 0 %
Eosinophils Absolute: 0 10*3/uL (ref 0.0–0.5)
Eosinophils Relative: 0 %
HCT: 29.3 % — ABNORMAL LOW (ref 36.0–46.0)
Hemoglobin: 9 g/dL — ABNORMAL LOW (ref 12.0–15.0)
Lymphocytes Relative: 4 %
Lymphs Abs: 1.2 10*3/uL (ref 0.7–4.0)
MCH: 27.4 pg (ref 26.0–34.0)
MCHC: 30.7 g/dL (ref 30.0–36.0)
MCV: 89.3 fL (ref 80.0–100.0)
Monocytes Absolute: 0.9 10*3/uL (ref 0.1–1.0)
Monocytes Relative: 3 %
Neutro Abs: 29 10*3/uL — ABNORMAL HIGH (ref 1.7–7.7)
Neutrophils Relative %: 93 %
Platelets: 173 10*3/uL (ref 150–400)
RBC: 3.28 MIL/uL — ABNORMAL LOW (ref 3.87–5.11)
RDW: 20.3 % — ABNORMAL HIGH (ref 11.5–15.5)
WBC: 31.2 10*3/uL — ABNORMAL HIGH (ref 4.0–10.5)
nRBC: 0 % (ref 0.0–0.2)
nRBC: 0 /100 WBC

## 2021-05-03 LAB — BASIC METABOLIC PANEL
Anion gap: 6 (ref 5–15)
BUN: 19 mg/dL (ref 8–23)
CO2: 19 mmol/L — ABNORMAL LOW (ref 22–32)
Calcium: 7.2 mg/dL — ABNORMAL LOW (ref 8.9–10.3)
Chloride: 109 mmol/L (ref 98–111)
Creatinine, Ser: 1.36 mg/dL — ABNORMAL HIGH (ref 0.44–1.00)
GFR, Estimated: 41 mL/min — ABNORMAL LOW (ref >60)
Glucose, Bld: 132 mg/dL — ABNORMAL HIGH (ref 70–99)
Potassium: 3 mmol/L — ABNORMAL LOW (ref 3.5–5.1)
Sodium: 134 mmol/L — ABNORMAL LOW (ref 135–145)

## 2021-05-03 LAB — HEPARIN LEVEL (UNFRACTIONATED): Heparin Unfractionated: 0.34 IU/mL (ref 0.30–0.70)

## 2021-05-03 LAB — HEMOGLOBIN A1C
Hgb A1c MFr Bld: 5.9 % — ABNORMAL HIGH (ref 4.8–5.6)
Mean Plasma Glucose: 122.63 mg/dL

## 2021-05-03 LAB — APTT: aPTT: 127 seconds — ABNORMAL HIGH (ref 24–36)

## 2021-05-03 MED ORDER — ALBUTEROL SULFATE HFA 108 (90 BASE) MCG/ACT IN AERS: 1.0000 | INHALATION_SPRAY | RESPIRATORY_TRACT | Status: DC | PRN

## 2021-05-03 MED ORDER — PHENAZOPYRIDINE HCL 100 MG PO TABS
100.0000 mg | ORAL_TABLET | ORAL | Status: DC | PRN
  Administered 2021-05-03 – 2021-05-04 (×2): 100 mg via ORAL
  Filled 2021-05-03 (×4): qty 1

## 2021-05-03 MED ORDER — TOPIRAMATE 100 MG PO TABS
200.0000 mg | ORAL_TABLET | Freq: Two times a day (BID) | ORAL | Status: DC
  Administered 2021-05-03 – 2021-05-07 (×9): 200 mg via ORAL
  Filled 2021-05-03 (×9): qty 2

## 2021-05-03 MED ORDER — PRAVASTATIN SODIUM 40 MG PO TABS
40.0000 mg | ORAL_TABLET | Freq: Every day | ORAL | Status: DC
  Administered 2021-05-04 – 2021-05-06 (×3): 40 mg via ORAL
  Filled 2021-05-03 (×3): qty 1

## 2021-05-03 MED ORDER — LOPERAMIDE HCL 2 MG PO CAPS
2.0000 mg | ORAL_CAPSULE | ORAL | Status: DC | PRN
  Administered 2021-05-04: 10:00:00 2 mg via ORAL
  Filled 2021-05-03 (×3): qty 1

## 2021-05-03 MED ORDER — LOPERAMIDE HCL 2 MG PO CAPS
2.0000 mg | ORAL_CAPSULE | ORAL | Status: AC | PRN
  Administered 2021-05-03 (×3): 2 mg via ORAL
  Filled 2021-05-03 (×5): qty 1

## 2021-05-03 MED ORDER — GUAIFENESIN ER 600 MG PO TB12
1200.0000 mg | ORAL_TABLET | Freq: Two times a day (BID) | ORAL | Status: DC | PRN
  Administered 2021-05-04: 1200 mg via ORAL
  Filled 2021-05-03 (×2): qty 2

## 2021-05-03 MED ORDER — PREGABALIN 50 MG PO CAPS
50.0000 mg | ORAL_CAPSULE | Freq: Two times a day (BID) | ORAL | Status: DC
  Administered 2021-05-03 – 2021-05-07 (×9): 50 mg via ORAL
  Filled 2021-05-03 (×9): qty 1

## 2021-05-03 MED ORDER — GERHARDT'S BUTT CREAM
TOPICAL_CREAM | Freq: Two times a day (BID) | CUTANEOUS | Status: DC
  Administered 2021-05-03 (×2): 1 via TOPICAL
  Filled 2021-05-03: qty 1

## 2021-05-03 MED ORDER — LEVOTHYROXINE SODIUM 88 MCG PO TABS
88.0000 ug | ORAL_TABLET | Freq: Every day | ORAL | Status: DC
  Administered 2021-05-04 – 2021-05-07 (×4): 88 ug via ORAL
  Filled 2021-05-03 (×4): qty 1

## 2021-05-03 MED ORDER — ALBUTEROL SULFATE (2.5 MG/3ML) 0.083% IN NEBU
2.5000 mg | INHALATION_SOLUTION | RESPIRATORY_TRACT | Status: DC | PRN
  Administered 2021-05-03: 18:00:00 2.5 mg via RESPIRATORY_TRACT
  Filled 2021-05-03: qty 3

## 2021-05-03 MED ORDER — UMECLIDINIUM BROMIDE 62.5 MCG/ACT IN AEPB
1.0000 | INHALATION_SPRAY | Freq: Every day | RESPIRATORY_TRACT | Status: DC
  Administered 2021-05-04 – 2021-05-07 (×4): 1 via RESPIRATORY_TRACT
  Filled 2021-05-03: qty 7

## 2021-05-03 MED ORDER — POTASSIUM CHLORIDE 20 MEQ PO PACK
40.0000 meq | PACK | ORAL | Status: AC
  Administered 2021-05-03 (×2): 40 meq
  Filled 2021-05-03 (×2): qty 2

## 2021-05-03 MED ORDER — ENSURE ENLIVE PO LIQD
237.0000 mL | Freq: Two times a day (BID) | ORAL | Status: DC
  Administered 2021-05-03 – 2021-05-06 (×5): 237 mL via ORAL

## 2021-05-03 MED ORDER — FLUTICASONE FUROATE-VILANTEROL 200-25 MCG/ACT IN AEPB
1.0000 | INHALATION_SPRAY | Freq: Every day | RESPIRATORY_TRACT | Status: DC
  Administered 2021-05-04 – 2021-05-07 (×4): 1 via RESPIRATORY_TRACT
  Filled 2021-05-03: qty 28

## 2021-05-03 NOTE — Evaluation (Signed)
Physical Therapy Evaluation Patient Details Name: Jenny Kelly MRN: [id number] DOB: 1945-11-02 [name]: 05/03/2021  History of Present Illness  Jenny Kelly is a 76 y.o. female admitted 05/01/21 s transfer from [place] on 1/9 with acute left hemiplegia. 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 En route to from [place] developed dyspnea and respiratory distress and was placed on BiPAP.  There was also concern that she might have had an aspiration event prior to being placed on BiPAP. 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 MRI brain and CT chest pending. PMH includes Allergy, Anemia, Anxiety, Arthritis, Asthma, Atrial fibrillation, Cataract, CHF, CKD, Clotting disorder, COPD, Depression, Diabetes mellitus without complication, Heart murmur, Hyperlipidemia, MI, Neuromuscular disorder, Seizures, Sleep apnea, Stroke, and Thyroid disease.  Clinical Impression  Patient admitted with above diagnosis. Patient presents with generalized weakness, impaired balance, impaired coordination, decreased activity tolerance, and impaired cognition. Patient was independent with mobility without AD PTA. Patient currently requires modA for bed mobility and modA+2 for safety with sit to stand with initial posterior lean. Patient required modA+2 initially for short steps towards recliner progressing to modA+1 with HHAx1. Patient will benefit from skilled PT services during acute stay to address listed deficits. Recommend CIR at discharge to maximize functional independence and return to PLOF. Husband able to provide 24/7 supervision/assist at discharge.        Recommendations for follow up therapy are one component of a multi-disciplinary discharge planning process, led by the attending physician.  Recommendations may be updated based on patient status, additional functional criteria and insurance authorization.  Follow Up Recommendations Acute  inpatient rehab (3hours/day)    Assistance Recommended at Discharge Frequent or constant Supervision/Assistance  Patient can return home with the following  A lot of help with walking and/or transfers;A lot of help with bathing/dressing/bathroom;Assistance with cooking/housework;Assistance with feeding;Direct supervision/assist for medications management;Direct supervision/assist for financial management;Assist for transportation    Equipment Recommendations Other (comment) (TBD)  Recommendations for Other Services  Rehab consult    Functional Status Assessment Patient has had a recent decline in their functional status and demonstrates the ability to make significant improvements in function in a reasonable and predictable amount of time.     Precautions / Restrictions Precautions Precautions: Fall (SBP 120-140) Precaution Comments: watch O2 Restrictions Weight Bearing Restrictions: No      Mobility  Bed Mobility Overal bed mobility: Needs Assistance Bed Mobility: Supine to Sit     Supine to sit: Mod assist     General bed mobility comments: for trunk elevation and directives to use the rail to assist, Pt has good initiation for bed mobility,    Transfers Overall transfer level: Needs assistance Equipment used: 2 person hand held assist Transfers: Sit to/from Stand;Bed to chair/wheelchair/BSC Sit to Stand: Mod assist;+2 safety/equipment;From elevated surface   Step pivot transfers: Mod assist;+2 safety/equipment       General transfer comment: posterior lean with no awareness, able to progress to mod A of 1 for SPT after ambulating about 4 ft    Ambulation/Gait                  Stairs            Wheelchair Mobility    Modified Rankin (Stroke Patients Only) Modified Rankin (Stroke Patients Only) Pre-Morbid Rankin Score: Slight disability Modified Rankin: Moderately severe disability     Balance Overall balance assessment: Needs  assistance Sitting-balance support: Bilateral upper extremity supported;Feet unsupported (ICU bed) Sitting balance-Leahy Scale: Fair Sitting balance - Comments: close min guard Postural control: Posterior lean Standing balance support: Bilateral upper extremity supported;Single extremity supported Standing balance-Leahy Scale: Poor Standing balance comment: dependent on external support                             Pertinent Vitals/Pain Pain Assessment: 0-10 Pain Score: 5  Pain Location: headache Pain Descriptors / Indicators: Headache Pain Intervention(s): Monitored during session    Home Living Family/patient expects to be discharged to:: Private residence   Available Help at Discharge: Family;Available 24 hours/day Type of Home: House Home Access: Level entry       Home Layout: One level Home Equipment: Conservation officer, nature (2 wheels);BSC/3in1;Shower seat      Prior Function Prior Level of Function : Needs assist  Cognitive Assist : ADLs (cognitive)   ADLs (Cognitive):  (IADL medicine and financial management by husband) Physical Assist : ADLs (physical)   ADLs (physical): Dressing;Bathing;IADLs Mobility Comments: ambulating independently with no AD ADLs Comments: assisting with bathing and dressing     Hand Dominance   Dominant Hand: Right    Extremity/Trunk Assessment   Upper Extremity Assessment Upper Extremity Assessment: LUE deficits/detail LUE Deficits / Details: distal stronger than proximal overall 4/5, fine motor coordination deficits present, FF to approx 45degrees LUE Sensation: WNL LUE Coordination: decreased fine motor;decreased gross motor    Lower Extremity Assessment Lower Extremity Assessment: RLE deficits/detail;LLE deficits/detail RLE Deficits / Details: grossly 4-/5 LLE Deficits / Details: grossly 4-/5 but with mild coordination deficits LLE Sensation: WNL LLE Coordination: decreased fine motor;decreased gross motor    Cervical  / Trunk Assessment Cervical / Trunk Assessment: Kyphotic  Communication   Communication: HOH  Cognition Arousal/Alertness: Awake/alert Behavior During Therapy: WFL for tasks assessed/performed Overall Cognitive Status: Impaired/Different from baseline Area of Impairment: Memory;Safety/judgement;Awareness                     Memory: Decreased short-term memory   Safety/Judgement: Decreased awareness of safety;Decreased awareness of deficits Awareness: Emergent   General Comments: requires multimodal cues and step by step instructions. Husband manages finances and medications at baseline.        General Comments General comments (skin integrity, edema, etc.): Husband "Ulice Dash" present throughout session, and very helpful and proactive    Exercises     Assessment/Plan    PT Assessment Patient needs continued PT services  PT Problem List Decreased strength;Decreased coordination;Decreased activity tolerance;Decreased balance;Decreased mobility;Decreased cognition;Decreased safety awareness;Cardiopulmonary status limiting activity       PT Treatment Interventions DME instruction;Gait training;Balance training;Functional mobility training;Therapeutic activities;Therapeutic exercise;Neuromuscular re-education;Patient/family education    PT Goals (Current goals can be found in the Care Plan section)  Acute Rehab PT Goals Patient Stated Goal: to be independent PT Goal Formulation: With patient/family Time For Goal Achievement: 05/17/21 Potential to Achieve Goals: Good    Frequency Min 4X/week     Co-evaluation PT/OT/SLP Co-Evaluation/Treatment: Yes Reason for Co-Treatment: For patient/therapist safety;To address functional/ADL transfers PT goals addressed during session: Mobility/safety with mobility;Balance;Strengthening/ROM OT goals addressed during session: ADL's and self-care;Strengthening/ROM       AM-PAC PT "6 Clicks" Mobility  Outcome Measure Help needed  turning from your back to your side while in a flat bed without using bedrails?: A Lot Help needed moving from lying on your back to sitting on the side of a flat bed without using  bedrails?: A Lot Help needed moving to and from a bed to a chair (including a wheelchair)?: Total Help needed standing up from a chair using your arms (e.g., wheelchair or bedside chair)?: Total Help needed to walk in hospital room?: Total Help needed climbing 3-5 steps with a railing? : Total 6 Click Score: 8    End of Session Equipment Utilized During Treatment: Oxygen Activity Tolerance: Patient tolerated treatment well Patient left: in chair;with call bell/phone within reach;with chair alarm set;with family/visitor present Nurse Communication: Mobility status PT Visit Diagnosis: Unsteadiness on feet (R26.81);Muscle weakness (generalized) (M62.81);Other abnormalities of gait and mobility (R26.89);Other symptoms and signs involving the nervous system [id number])    Time: [id number] PT Time Calculation (min) (ACUTE ONLY): 35 min   Charges:   PT Evaluation $PT Eval Moderate Complexity: 1 Mod          Annasofia Pohl A. Gilford Rile PT, DPT Acute Rehabilitation Services Pager [phone] Office [phone]   Linna Hoff 05/03/2021, 12:18 PM

## 2021-05-03 NOTE — Progress Notes (Signed)
Inpatient Rehab Admissions Coordinator:  ? ?Per therapy recommendations,  patient was screened for CIR candidacy by Renesmae Donahey, MS, CCC-SLP. At this time, Pt. Appears to be a a potential candidate for CIR. I will place   order for rehab consult per protocol for full assessment. Please contact me any with questions. ? ?Ladamien Rammel, MS, CCC-SLP ?Rehab Admissions Coordinator  ?[phone] (celll) ?[phone] (office) ? ?

## 2021-05-03 NOTE — Progress Notes (Signed)
STROKE TEAM PROGRESS NOTE   INTERVAL HISTORY Husband at bedside.  Patient awake alert, orientated x3, still has mild left facial droop and left upper extremity drift.  Heart rate stabilized, BP improved.  Off pressor.  MRI showed left MCA moderate sized infarct.  Vitals:   05/03/21 1200 05/03/21 1600 05/03/21 1800 05/03/21 1830  BP:  137/74 120/71 120/79  Pulse: 64  81 79  Resp: 16 20 18 19   Temp: 97.9 F (36.6 C) 97.7 F (36.5 C)    TempSrc: Oral Oral    SpO2: 100%  98% 93%  Weight:      Height:       CBC:  Recent Labs  Lab 05/02/21 0910 05/03/21 0538  WBC 45.3* 31.2*  NEUTROABS 39.5* 29.0*  HGB 10.0* 9.0*  HCT 32.2* 29.3*  MCV 89.2 89.3  PLT 224 [id number]   Basic Metabolic Panel:  Recent Labs  Lab 05/01/21 2322 05/02/21 0910 05/02/21 2055 05/03/21 0538  NA 137 137  --  134*  K 4.2 3.9  --  3.0*  CL 109 111  --  109  CO2 18* 19*  --  19*  GLUCOSE 142* 185*  --  132*  BUN 19 20  --  19  CREATININE 1.83* 1.48*  --  1.36*  CALCIUM 8.0* 7.3*  --  7.2*  MG 1.3*  --  3.5*  --   PHOS 2.9  --   --   --    Lipid Panel:  Recent Labs  Lab 05/03/21 0538  CHOL 148  TRIG 132  HDL 24*  CHOLHDL 6.2  VLDL 26  LDLCALC 98   HgbA1c:  Recent Labs  Lab 05/03/21 0538  HGBA1C 5.9*   Urine Drug Screen: No results for input(s): LABOPIA, COCAINSCRNUR, LABBENZ, AMPHETMU, THCU, LABBARB in the last 168 hours.  Alcohol Level No results for input(s): ETH in the last 168 hours.  IMAGING past 24 hours MR BRAIN WO CONTRAST  Result Date: 05/03/2021 CLINICAL DATA:  Stroke, follow-up EXAM: MRI HEAD WITHOUT CONTRAST TECHNIQUE: Multiplanar, multiecho pulse sequences of the brain and surrounding structures were obtained without intravenous contrast. COMPARISON:  05/01/2021 CT head and CTA head and neck, 11/12/2009 MRI FINDINGS: Brain: Confluent restricted diffusion in the insula, anterior temporal lobe, and inferior frontal lobe, with additional less severe restricted diffusion in the right  caudate and lentiform nucleus (series 2, images 22-31) consistent with known right MCA occlusion. Additional non contiguous areas of restricted diffusion in the right frontal lobe (series 2, image 32 and series 3, image 24), right temporal lobe (series 2, image 31 and series 3, image 17), and right parietal lobe (series 2, image 33 and series 3, image 14). These areas are associated with increased T2 signal, likely cytotoxic edema, with greater T2 hyperintense signal in the caudate and lentiform nucleus, with mild narrowing of the frontal horn of the right lateral ventricle. No definite acute hemorrhage. No mass or midline shift. Encephalomalacia in the right posterior temporal and right parietal lobes, consistent with remote right MCA territory infarct. No hydrocephalus or extra-axial collection. Vascular: Normal proximal flow voids. Poor visualization of more distal right MCA flow voids. Skull and upper cervical spine: Normal marrow signal. Sinuses/Orbits: Status post bilateral lens replacements. Air-fluid level in the left maxillary sinus. Mucosal thickening in the right maxillary sinus, right-greater-than-left frontal sinus, bilateral sphenoid sinuses, and throughout the ethmoid air cells. Other: Fluid throughout the bilateral mastoid air cells. IMPRESSION: Acute infarcts involving the insula, anterior temporal lobe, and inferior  frontal lobe, consistent with known recent right MCA occlusion, now status post thrombectomy. Additional areas of restricted diffusion in the caudate and lentiform nucleus appear slightly less hyperintense on diffusion-weighted imaging, possibly slightly less acute infarcts. These areas are all associated with various degrees of edema, causing mild narrowing of the frontal horn of the right lateral ventricle, but without significant mass effect or midline shift. No evidence of hemorrhagic transformation. Electronically Signed   By: Merilyn Baba M.D.   On: 05/03/2021 17:51    Physical  Exam  Constitutional: Appears well-developed and well-nourished.  Cardiovascular: Normal rate and regular rhythm.  Respiratory: Effort normal, non-labored breathing  Neuro: awake alert, orientated x4.  No aphasia, fluent language, mild dysarthria, follows simple commands, able to name and repeat.  Visual fields full, no gaze palsy, mild left facial droop, left upper extremity mild drift, 3+/5, otherwise no weakness found right upper and bilateral lower extremities.  Sensation symmetrical, right finger-to-nose intact.   ASSESSMENT/PLAN [name] is a 76 y.o. female with history of a.fib (xarelto) Allergy, Anemia, Anxiety, Arthritis, Asthma, Cataract, CHF, Chronic kidney disease, Clotting disorder , COPD, Depression, Diabetes mellitus without complication , GERD  Heart murmur, Hyperlipidemia, Myocardial infarction, Neuromuscular disorder , Seizures , Sleep apnea, Stroke and Thyroid disease. Patient's husband reports Jenny Kelly had dysuria on Friday, and she took AZO OTC, with resolution of sx on Sunday (1/8). This morning, patient was up at 8 AM, ambulated to the restroom without issue. Husband reports her urine smelled "awful." At 9:30 AM, he was unable to get her to help with getting dressed; she was lethargic and weak. He did not notice if the weakness was unilateral or generalized. He then called EMS, who took her to [facility]. Around noon while at [place], patient was with nonsensical speech and a L facial droop. Teleneurology evaluated the patient. She was not a candidate for TNK due to anticoagulation. CTA of head and neck revealed an acute right M1 occlusion and severe stenosis of the right M2 more distally. The patient was emergently transported to [facility] for CTP and possible mechanical thrombectomy. Neurology and Neuro IR decided against mechanical thrombectomy given small penumbra.  Patient experienced bradycardia and dopamine was started, she then had a 25 beat run of VT. Dopamine  was d/c'd, then she became bradycardic again and atropine was needed. She is currently on norepinephrine and vasopressin for BP and HR control. Cardiology consult placed today. WBC elevated to 45.3, zosyn started and cultures pending.   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 MRI  Acute infarcts involving the insula, anterior temporal lobe, and inferior frontal lobe, consistent with known recent right MCA occlusion. Additional areas of restricted diffusion in the caudate and lentiform nucleus appear slightly less hyperintense on diffusion-weighted imaging, possibly slightly less acute infarcts.  2D Echo EF 60-65% LDL 98 HgbA1c 5.9 VTE prophylaxis - SCDs Xarelto (rivaroxaban) daily prior to admission, now on heparin IV. Will consider to switch to po either Xarelto or eliquis in am Therapy recommendations:  CIR Disposition:  pending  Bradycardia and Hypotension Dopamine d/c'd 1/10 after 25 beat run of VT  Atropine used for bradycardia 1/10 Norepinephrine  Vasopressin  PICC line ordered for access BP goal 120-140  Avoid low BP  Respiratory Failure Bipap to 4LNC stable  Atrial fibrillation  Home medications: Amiodarone 50mg  daily and Xarelto 20mg  Currently bradycardic, now improved Cardiology on board On heparin IV, Will consider to switch to po either Xarelto or eliquis in am  Hyperlipidemia Home meds: none LDL 98, goal < 70 Statin intolerance listed for muscle weakness Will try low intensity pravastatin 40 Continue statin at discharge  Other Stroke Risk Factors Advanced Age >/= 40  Former Cigarette smoker,advised to stop smoking Hx  stroke/TIA 2009- Hemorrhagic stroke where her only preceding sx was severe HA, no residual sx. Coronary artery disease 2002- MI with stent placement c/b pulmonary emboli 2015- triple bypass cardiac surgery. Migraines Obstructive sleep apnea, on CPAP at home Congestive heart failure  Other Active Problems Hyperkalemia 5.6-> 3.9->3.0 - supplement Leukocytosis WBC 45.3->31.2 On zosyn Pulmonary fibrosis- [facility]  Upper lobe predominant emphysema  Residual from COVID in 2021 Ureteral Stone with UTI 1/9 found to have 7 mm left ureteral stone.  Cystoscopy with left ureteral stent placement and left retrograde pyelogram.  Hospital day # 2  This patient is critically ill due to sepsis, septic shock, PAF, bradycardia, hypotension, stroke and at significant risk of neurological worsening, death form heart failure, recurrent stroke, hemorrhagic transformation, cardiac arrest, septic shock, seizure. This patient's care requires constant monitoring of vital signs, hemodynamics, respiratory and cardiac monitoring, review of multiple databases, neurological assessment, discussion with family, other specialists and medical decision making of high complexity. I spent 35 minutes of neurocritical care time in the care of this patient. I had long discussion with husband at bedside, updated pt current condition, treatment plan and potential prognosis, and answered all the questions. He expressed understanding and appreciation.   Rosalin Hawking, MD PhD Stroke Neurology 05/03/2021 7:56 PM    To contact Stroke Continuity provider, please refer to [email or web address] After hours, contact General Neurology

## 2021-05-03 NOTE — H&P (Addendum)
NAME:  Jenny Kelly, MRN:  [id number], DOB:  [birth year], LOS: 2 ADMISSION DATE:  05/01/2021, CONSULTATION DATE:  05/01/21 REFERRING MD:  Cheral Marker CHIEF COMPLAINT:  AMS   History of Present Illness:  Jenny Kelly is a 76 y.o. female who has a PMH as outlined below including A. fib (on Xarelto).  She presented as transfer from [place] on 1/9 with acute left hemiplegia.  She apparently had dysuria 3 days prior Friday 1/6 and took AZO with improvement in symptoms by Sunday 1/8.  Morning of 1/9, she had generalized weakness and left-sided hemiplegia.  She was taken to [facility] as a code stroke.  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  She was then transferred to [facility] for further evaluation and management.  In route to ED, she received multiple fluid boluses for concern of UTI/sepsis.  She then developed dyspnea and respiratory distress and was placed on BiPAP.  There was also concern that she might have had an aspiration event prior to being placed on BiPAP.  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  She was evaluated by neurology who did not feel that she was a candidate for any intervention.  She was subsequently admitted to the neuro ICU for ongoing management.  Pertinent  Medical History:  has CKD (chronic kidney disease), stage III ([place]); Polypharmacy; Post-concussion headache; Type 2 diabetes mellitus without complication ([place]); Hypothyroidism; Hyperlipidemia; Coronary artery disease involving native coronary artery of native heart; PAF (paroxysmal atrial fibrillation) ([place]); Memory deficit; Risk for falls; S/P CABG (coronary artery bypass graft); Tremor; Stroke [facility]); Sepsis with acute hypoxic respiratory failure without septic shock ([place]); Hyperkalemia; AKI (acute kidney injury) ([place]); Bradycardia; and Wide-complex tachycardia on their problem list.  Significant Hospital Events: Including  procedures, antibiotic start and stop dates in addition to other pertinent events   1/9 transfer from [facility] as code stroke but CT perfusion shows completed right frontoparietal infarct. 1/9 found to have 7 mm left ureteral stone.  Cystoscopy with left ureteral stent placement and left retrograde pyelogram. 1/10 echo shows normal LVEF with grade 2 diastolic dysfunction.   Interim History / Subjective:   Continues to improve.  Complains mainly of suprapubic tenderness.  Has weaned off vasopressors.  Having diarrhea.  Objective:  Blood pressure (!) 119/44, pulse 64, temperature 97.9 F (36.6 C), temperature source Oral, resp. rate 16, height 5\' 1"  (1.549 m), weight 70.3 kg, SpO2 100 %.        Intake/Output Summary (Last 24 hours) at 05/03/2021 1215 Last data filed at 05/03/2021 1200 Gross per 24 hour  Intake 1262.96 ml  Output 1000 ml  Net 262.96 ml    Filed Weights   05/01/21 1821 05/03/21 0500  Weight: 63 kg 70.3 kg    Examination: General: Elderly female, resting in bed, in NAD. Neuro: Awake and follows commands.  Left-sided weakness is improving HEENT: [place]/AT. Sclerae anicteric.  On 3 L Cardiovascular: IRIR, no M/R/G.  Lungs: Respirations even and unlabored.   Abdomen: CVA tenderness has resolved.  Suprapubic tenderness Musculoskeletal: No gross deformities, no edema.  Skin: Intact, warm, no rashes  Labs/imaging personally reviewed:  CT head/CTA 1/9 (per report) > right M1 and M2 occlusion. CTP 1/9 > 13 mL core infarct small area of penumbra Improving creatinine to 1.36 Improving leukocytosis 31.2 E. coli sensitive to ceftriaxone Assessment & Plan:   Critically ill due to  septic shock caused by pyelonephritis from obstructing left renal calculus Nephrolithiasis status post left ureteric stenting Reactivation of prior right MCA stroke symptoms Resolving acute lung injury from sepsis Coronary artery disease with prior history of CHF History of atrial  fibrillation on Xarelto and amiodarone Dyslipidemia by history Hypertension by history History of hypothyroidism History of seizures History of depression AKI  Plan:  -Improving hemodynamics and leukocytosis on current therapy. -Wean oxygen as tolerated.  Progressive ambulation -Vagal stimulation from intra-abdominal infection.  Will hold amiodarone.  Removal of Foley catheter in likely to improve bradycardia from vagal stimulation. -Elective stroke work-up including MRI although acute stroke unlikely. - On heparin for Afib.  -We will contact urology for definitive management. -Loperamide for diarrhea.  Best practice (evaluated daily):  Diet/type: Oral diet DVT prophylaxis: systemic heparin GI prophylaxis: N/A Lines: Needs PICC Foley: Foley catheter in place Code Status:  limited - no CPR or defibs.  Short term intubation OK.  ACLS meds OK. Last date of multidisciplinary goals of care discussion: None yet.   Kipp Brood, MD [facility] ICU Physician [place]  Pager: [phone] Mobile: [phone] After hours: [phone].

## 2021-05-03 NOTE — Progress Notes (Signed)
SLP Consult Note  Patient Details Name: Jenny Kelly MRN: [id number] DOB: 10/27/1945  Orders received for BSE due to concern for pt inability to chew solids due to poor dentition. Consulted with RN, who has no concern regarding possible aspiration, only difficulty chewing. Recommend trial of Dys2 (finely chopped) diet with extra gravy/sauces for moisture to facilitate more effective mastication. RN to notify SLP later today if skilled SLP evaluation is warranted.   Tyra Michelle B. Murvin Natal, [facility], CCC-SLP Speech Language Pathologist Office: [phone]  Leigh Aurora 05/03/2021, 9:35 AM

## 2021-05-03 NOTE — Evaluation (Signed)
Occupational Therapy Evaluation Patient Details Name: Jenny Kelly MRN: [id number] DOB: 1946-03-24 [name]: 05/03/2021   History of Present Illness Jenny Kelly is a 76 y.o. female admitted 05/01/21 s transfer from [place] on 1/9 with acute left hemiplegia. 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 En route to from [place] developed dyspnea and respiratory distress and was placed on BiPAP.  There was also concern that she might have had an aspiration event prior to being placed on BiPAP. 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 MRI brain and CT chest pending. PMH includes Allergy, Anemia, Anxiety, Arthritis, Asthma, Atrial fibrillation, Cataract, CHF, CKD, Clotting disorder, COPD, Depression, Diabetes mellitus without complication, Heart murmur, Hyperlipidemia, MI, Neuromuscular disorder, Seizures, Sleep apnea, Stroke, and Thyroid disease.   Clinical Impression   Pt is typically Independent for mobility without DME, she uses a shower chair and has min A from her husband for bathing (tub/shower with DOORS) and he also assists with her dressing. Husband also does medication management and financial management, they have supportive children who live near by as well. Today Pt is mod A for bed mobility, demonstrating grooming at min A EOB, ability to perform figure 4 for LB dressing/bathing, mod A +2 safety for sit<>stand, short ambulation, and then progressed to mod A +1 HHA prior to being seated in the recliner. [name] demonstrating deficits in ROM, strength and coordination - strength better distal>proximal and impacting ability to perform ADL. AT this time recommending AIR to maximize safety and independence in ADL and functional transfers and continue following acutely. Pt's husband "Jenny Kelly" in agreement. Next session to focus on HEP LUE, and OOB activity with balance training.     Recommendations for follow up therapy are one  component of a multi-disciplinary discharge planning process, led by the attending physician.  Recommendations may be updated based on patient status, additional functional criteria and insurance authorization.   Follow Up Recommendations  Acute inpatient rehab (3hours/day)    Assistance Recommended at Discharge Frequent or constant Supervision/Assistance  Patient can return home with the following      Functional Status Assessment  Patient has had a recent decline in their functional status and demonstrates the ability to make significant improvements in function in a reasonable and predictable amount of time.  Equipment Recommendations  None recommended by OT (Pt has appropriate DME at home)    Recommendations for Other Services Rehab consult     Precautions / Restrictions Precautions Precautions: Fall Precaution Comments: watch O2 Restrictions Weight Bearing Restrictions: No      Mobility Bed Mobility Overal bed mobility: Needs Assistance Bed Mobility: Supine to Sit     Supine to sit: Mod assist     General bed mobility comments: for trunk elevation and directives to use the rail to assist, Pt has good initiation for bed mobility,    Transfers Overall transfer level: Needs assistance Equipment used: 2 person hand held assist Transfers: Sit to/from Stand;Bed to chair/wheelchair/BSC Sit to Stand: Mod assist;+2 safety/equipment;From elevated surface     Step pivot transfers: Mod assist;+2 safety/equipment     General transfer comment: posterior lean with no awareness, able to progress to mod A of 1 for SPT after ambulating about 4 ft      Balance Overall balance assessment: Needs assistance Sitting-balance support: Bilateral upper extremity supported;Feet unsupported (ICU bed) Sitting balance-Leahy Scale: Fair Sitting balance - Comments: close min guard Postural  control: Posterior lean Standing balance support: Bilateral upper extremity supported;Single  extremity supported Standing balance-Leahy Scale: Poor Standing balance comment: dependent on external support                           ADL either performed or assessed with clinical judgement   ADL Overall ADL's : Needs assistance/impaired Eating/Feeding: Set up;Bed level (HOB elevated) Eating/Feeding Details (indicate cue type and reason): diet under review by RN staff, unable to chew up a piece of bacon and proceeded to gag on it Grooming: Wash/dry face;Set up;Sitting   Upper Body Bathing: Moderate assistance;Sitting Upper Body Bathing Details (indicate cue type and reason): husband assists her at baseline Lower Body Bathing: Moderate assistance;Sitting/lateral leans Lower Body Bathing Details (indicate cue type and reason): Pt is able to perform figure 4 with BLE Upper Body Dressing : Minimal assistance;Sitting   Lower Body Dressing: Minimal assistance;Sitting/lateral leans Lower Body Dressing Details (indicate cue type and reason): will likely need more assist for cloting that requires sit<>stand Toilet Transfer: Moderate assistance;+2 for safety/equipment;Ambulation Toilet Transfer Details (indicate cue type and reason): posterior lean with no awareness initially, balance improved with short ambulation Toileting- Clothing Manipulation and Hygiene: Moderate assistance;Sit to/from stand   Tub/ Engineer, structural: Moderate assistance;Ambulation;Shower seat   Functional mobility during ADLs: Moderate assistance;+2 for safety/equipment (progressed to heavy min A of 1) General ADL Comments: L sided weakness, balance, cognition, HOH impacting ADL performance     Vision Baseline Vision/History: 1 Wears glasses Ability to See in Adequate Light: 0 Adequate Patient Visual Report: No change from baseline       Perception     Praxis      Pertinent Vitals/Pain Pain Assessment: 0-10 Pain Score: 5  Pain Location: headache Pain Descriptors / Indicators: Headache Pain  Intervention(s): Monitored during session;Repositioned     Hand Dominance Right   Extremity/Trunk Assessment Upper Extremity Assessment Upper Extremity Assessment: LUE deficits/detail LUE Deficits / Details: distal stronger than proximal overall 4/5, fine motor coordination deficits present, FF to approx 45degrees LUE Sensation: WNL LUE Coordination: decreased fine motor;decreased gross motor   Lower Extremity Assessment Lower Extremity Assessment: Defer to PT evaluation   Cervical / Trunk Assessment Cervical / Trunk Assessment: Kyphotic   Communication Communication Communication: HOH   Cognition Arousal/Alertness: Awake/alert Behavior During Therapy: WFL for tasks assessed/performed Overall Cognitive Status: Impaired/Different from baseline Area of Impairment: Memory;Safety/judgement;Awareness                     Memory: Decreased short-term memory   Safety/Judgement: Decreased awareness of safety;Decreased awareness of deficits Awareness: Emergent   General Comments: requires multimodal cues and step by step instructions. Husband manages finances and medications at baseline.     General Comments  HUsband "Jenny Kelly" present throughout session, and very helpful and proactive    Exercises     Shoulder Instructions      Home Living Family/patient expects to be discharged to:: Private residence   Available Help at Discharge: Family;Available 24 hours/day Type of Home: House Home Access: Level entry     Home Layout: One level     Bathroom Shower/Tub: Tub/shower unit;Door (sliding doors)   Bathroom Toilet: Standard     Home Equipment: Agricultural consultant (2 wheels);BSC/3in1;Shower seat          Prior Functioning/Environment Prior Level of Function : Needs assist  Cognitive Assist : ADLs (cognitive)   ADLs (Cognitive):  (IADL medicine and financial management by husband)  Physical Assist : ADLs (physical)   ADLs (physical): Dressing;Bathing Mobility  Comments: ambulating independently with no AD ADLs Comments: assisting with bathing and dressing        OT Problem List: Decreased strength;Decreased range of motion;Decreased activity tolerance;Impaired balance (sitting and/or standing);Decreased cognition;Decreased safety awareness;Impaired UE functional use      OT Treatment/Interventions: Self-care/ADL training;Therapeutic exercise;DME and/or AE instruction;Therapeutic activities;Patient/family education;Balance training    OT Goals(Current goals can be found in the care plan section) Acute Rehab OT Goals Patient Stated Goal: to get back home to Grandchildren OT Goal Formulation: With patient/family Time For Goal Achievement: 05/17/21 Potential to Achieve Goals: Good ADL Goals Pt Will Perform Grooming: with supervision;standing Pt Will Perform Upper Body Dressing: sitting;with set-up Pt Will Perform Lower Body Dressing: with min guard assist;sit to/from stand Pt Will Transfer to Toilet: with supervision;ambulating Pt Will Perform Toileting - Clothing Manipulation and hygiene: with set-up;sit to/from stand Pt/caregiver will Perform Home Exercise Program: Left upper extremity;With Supervision;With written HEP provided Additional ADL Goal #1: Pt will perform bed mobility at mod I level prior to engaging in ADL  OT Frequency: Min 2X/week    Co-evaluation PT/OT/SLP Co-Evaluation/Treatment: Yes Reason for Co-Treatment: For patient/therapist safety;To address functional/ADL transfers;Other (comment) (initial evaluation) PT goals addressed during session: Mobility/safety with mobility;Balance;Strengthening/ROM OT goals addressed during session: ADL's and self-care;Strengthening/ROM      AM-PAC OT "6 Clicks" Daily Activity     Outcome Measure Help from another person eating meals?: A Little Help from another person taking care of personal grooming?: A Little Help from another person toileting, which includes using toliet, bedpan, or  urinal?: A Lot Help from another person bathing (including washing, rinsing, drying)?: A Lot Help from another person to put on and taking off regular upper body clothing?: A Little Help from another person to put on and taking off regular lower body clothing?: A Lot 6 Click Score: 15   End of Session Equipment Utilized During Treatment: Gait belt;Oxygen (3L) Nurse Communication: Mobility status;Precautions  Activity Tolerance: Patient tolerated treatment well Patient left: in chair;with call bell/phone within reach;with chair alarm set;with family/visitor present;with nursing/sitter in room  OT Visit Diagnosis: Unsteadiness on feet (R26.81);Muscle weakness (generalized) (M62.81);Other symptoms and signs involving the nervous system (R29.898);Other symptoms and signs involving cognitive function;Hemiplegia and hemiparesis Hemiplegia - Right/Left: Left Hemiplegia - dominant/non-dominant: Non-Dominant Hemiplegia - caused by: Cerebral infarction                Time: 6237-6283 OT Time Calculation (min): 34 min Charges:  OT General Charges $OT Visit: 1 Visit OT Evaluation $OT Eval Moderate Complexity: 1 Mod  Nyoka Cowden OTR/L Acute Rehabilitation Services Pager: [phone] Office: [phone]   Evern Bio Yaritzy Huser 05/03/2021, 10:29 AM

## 2021-05-03 NOTE — Consult Note (Addendum)
ELECTROPHYSIOLOGY CONSULT NOTE    Patient ID: Jenny Kelly MRN: [id number], DOB/AGE: [birth year] 76 y.o.  Admit date: 05/01/2021 Date of Consult: 05/03/2021  Primary Physician: Crist Fat, MD Primary Cardiologist: Previously followed in [place] (no notes available, just encounters from claims) Electrophysiologist: New  Referring Provider: Dr. Eden Emms  Patient Profile: Jenny Kelly is a 76 y.o. female with a hx of CAD s/p CABG x 3, CKD stage III, CVA, hyperlipidemia, PE, atrial fibrillation, DM, seizures, thyroid disease, and COPD who is being seen 05/02/2021 for the evaluation of atrial fibrillation at the request of Dr. Denese Killings.  HPI:  Jenny Kelly is a 76 y.o. female with complicated medical history as above.   She was BIB via EMS transfer from [facility] for sepsis and code stroke on 05/01/2021 presenting with left hemiparesis.   CTA showed an acute M1 and M2 occlusion, felt to potentially be re-activation of old stroke. Also with UTI symptoms. Went into resp distress after IVF boluses given. She decompensated and was hypoxic in the 70s requiring BiPAP immediately on arrival to [facility] ED.  She was hypotensive in the 80s.    Neurology called to bedside to evaluate for possible mechanical thrombectomy.  CT PE revealed a 13 mL infarct core involving the right MCA territory.  She was deemed not a candidate for thrombectomy by Neuro IR.  In addition, she was found to have a left 7 mm upper ureteral kidney stone.  Due to concern for sepsis secondary to obstructing proximal 7 mm left ureteral stone, she underwent cystoscopy with ureteral stent placement 05/01/2020 with Dr. Liliane Shi.  Hypotensive overnight and started on NE drip. Hypotension persisted and started on dopamine drip.    She then had VT and chest pain, dopamine was discontinued with resolution of her tachycardia.  No loss of pulse. Midodrine started in addition to norepinephrine and vasopressin. She was on amiodarone at home,  but only 50 mg daily. Not previously followed by EP here.   EP consulted for bradycardia and WCT  Pertinent initial labs include COVID negative, UCx non-specific, BCx with E-Coli, Cr 1.76, K 5.6, Lactic acid 6.7, BNP 493.   WBC 7.2 on arrival, repeat that evening 39.5 -> 45.3 -> 31.2. Afebrile  Echo 05/02/2021 showed LVEF 60-65%. Normal LA and RA  Currently, pt is awake and alert on heparin gtt. No pressors. She remembers having AF post CABG, but isn't sure she's had any other than that. Husband is present and states her amio was decreased by her "Nephrologist" because it was affecting her kidneys. She denies any syncope. No chest pain other than when HRs in 120s yesterday.   Past Medical History:  Diagnosis Date   Allergy    Anemia    Anxiety    Arthritis    Asthma    Atrial fibrillation (HCC)    Blood transfusion without reported diagnosis    Cataract    CHF (congestive heart failure) (HCC)    Chronic kidney disease    Clotting disorder (HCC)    COPD (chronic obstructive pulmonary disease) (HCC)    Depression    Diabetes mellitus without complication (HCC)    GERD (gastroesophageal reflux disease)    Heart murmur    Hyperlipidemia    Myocardial infarction (HCC)    Neuromuscular disorder (HCC)    tremors   Seizures (HCC)    Sleep apnea    Stroke [facility])    Thyroid disease      Surgical History:  Past  Surgical History:  Procedure Laterality Date   ABDOMINAL HYSTERECTOMY     APPENDECTOMY     bladder tack     CHOLECYSTECTOMY     CORONARY ARTERY BYPASS GRAFT     CYSTOSCOPY W/ URETERAL STENT PLACEMENT Left 05/01/2021   Procedure: CYSTOSCOPY WITH RETROGRADE PYELOGRAM/URETERAL STENT PLACEMENT;  Surgeon: Ceasar Mons, MD;  Location: [place];  Service: Urology;  Laterality: Left;   ESOPHAGOGASTRODUODENOSCOPY ENDOSCOPY     EYE SURGERY     FRACTURE SURGERY Right    3rd finger   JOINT REPLACEMENT Bilateral    knees   TONSILLECTOMY AND ADENOIDECTOMY     TUBAL  LIGATION       Medications Prior to Admission  Medication Sig Dispense Refill Last Dose   amiodarone (PACERONE) 100 MG tablet Take 50 mg by mouth at bedtime.   04/30/2021   Cyanocobalamin (B-12 PO) Take by mouth.   05/01/2021   donepezil (ARICEPT) 10 MG tablet Take 10 mg by mouth at bedtime.   04/30/2021   ergocalciferol (VITAMIN D2) 1.25 MG (50000 UT) capsule Take 50,000 Units by mouth See admin instructions. Takes twice a week on Wednesday and Saturday   Past Week   guaiFENesin (MUCINEX PO) Take 1 tablet by mouth as needed (cough, congestion).   05/01/2021   levothyroxine (SYNTHROID) 88 MCG tablet Take 88 mcg by mouth daily before breakfast.   05/01/2021   montelukast (SINGULAIR) 10 MG tablet Take 10 mg by mouth at bedtime.   04/30/2021   omeprazole (PRILOSEC) 40 MG capsule Take 40 mg by mouth daily.   04/30/2021   Phenazopyridine HCl (URISTAT PO) Take 1 tablet by mouth as needed (UTI).   04/29/2021   pregabalin (LYRICA) 50 MG capsule Take 50 mg by mouth 2 (two) times daily.   04/30/2021   rivaroxaban (XARELTO) 20 MG TABS tablet Take 20 mg by mouth every evening.   04/30/2021 at 1300   topiramate (TOPAMAX) 200 MG tablet Take 200 mg by mouth 2 (two) times daily.   04/30/2021   albuterol (PROVENTIL HFA) 108 (90 Base) MCG/ACT inhaler Inhale two puffs every 4-6 hours if needed for coughing, wheezing, or shortness of breath. (Patient not taking: Reported on 05/01/2021) 1 each 1 Not Taking   azelastine (ASTELIN) 0.1 % nasal spray Use one spray in each nostril twice daily (Patient not taking: Reported on 05/01/2021) 30 mL 5 Not Taking   budesonide (PULMICORT) 0.5 MG/2ML nebulizer solution Take 2 mLs (0.5 mg total) by nebulization in the morning and at bedtime. (Patient not taking: Reported on 05/01/2021) 120 mL 5 Not Taking   famotidine (PEPCID) 40 MG tablet Take one tablet once daily (Patient not taking: Reported on 05/01/2021) 30 tablet 5 Not Taking   fluticasone (FLONASE) 50 MCG/ACT nasal spray SPRAY 1 SPRAY INTO BOTH NOSTRILS  DAILY. (Patient not taking: Reported on 05/01/2021) 48 mL 2 Not Taking   Fluticasone-Umeclidin-Vilant (TRELEGY ELLIPTA) 100-62.5-25 MCG/INH AEPB Inhale 1 puff into the lungs daily. (Patient not taking: Reported on 05/01/2021) 28 each 6 Not Taking   ipratropium (ATROVENT) 0.03 % nasal spray SPRAY 2 SPRAYS INTO BOTH NOSTRILS EVERY 12 HOURS. (Patient not taking: Reported on 05/01/2021) 90 mL 3 Not Taking    Inpatient Medications:   Chlorhexidine Gluconate Cloth  6 each Topical Daily   donepezil  10 mg Oral QHS   feeding supplement  237 mL Oral BID BM   [START ON 05/04/2021] levothyroxine  88 mcg Oral QAC breakfast   mouth rinse  15 mL  Mouth Rinse BID   montelukast  10 mg Oral QHS   potassium chloride  40 mEq Per Tube Q4H   pregabalin  50 mg Oral BID   sodium chloride flush  10-40 mL Intracatheter Q12H   topiramate  200 mg Oral BID    Allergies:  Allergies  Allergen Reactions   Ciprofloxacin Hives    Ask patient   Prochlorperazine Edisylate Other (See Comments)    Cant swallow Muscle cramping   Prednisone    Reduced Iso-Alpha Acids Complex    Statins Other (See Comments)    Muscle weakness    Sulfa Antibiotics Swelling   Azithromycin Rash   Latex Rash    Social History   Socioeconomic History   Marital status: Married    Spouse name: Vonna Kotyk   Number of children: 3   Years of education: Not on file   Highest education level: Not on file  Occupational History   Not on file  Tobacco Use   Smoking status: Former    Packs/day: 0.50    Years: 30.00    Pack years: 15.00    Types: Cigarettes    Quit date: 06/26/2000    Years since quitting: 20.8   Smokeless tobacco: Never  Vaping Use   Vaping Use: Former  Substance and Sexual Activity   Alcohol use: Not Currently   Drug use: Never   Sexual activity: Not on file  Other Topics Concern   Not on file  Social History Narrative   Not on file   Social Determinants of Health   Financial Resource Strain: Not on file  Food  Insecurity: Not on file  Transportation Needs: Not on file  Physical Activity: Not on file  Stress: Not on file  Social Connections: Not on file  Intimate Partner Violence: Not on file     Family History  Family history unknown: Yes     Review of Systems: All other systems reviewed and are otherwise negative except as noted above.  Physical Exam: Vitals:   05/03/21 0900 05/03/21 0915 05/03/21 0930 05/03/21 0945  BP: (!) 141/60  125/73   Pulse: 89 72 64 67  Resp: (!) 28 (!) 25 18 (!) 25  Temp:      TempSrc:      SpO2: 96% 96% 97% 100%  Weight:      Height:        GEN- The patient is well appearing, alert and oriented x 3 today.   HEENT: normocephalic, atraumatic; sclera clear, conjunctiva pink; hearing intact; oropharynx clear; neck supple Lungs- Clear to ausculation bilaterally, normal work of breathing.  No wheezes, rales, rhonchi Heart- Regular rate and rhythm, no murmurs, rubs or gallops GI- soft, non-tender, non-distended, bowel sounds present Extremities- no clubbing, cyanosis, or edema; DP/PT/radial pulses 2+ bilaterally MS- no significant deformity or atrophy Skin- warm and dry, no rash or lesion Psych- euthymic mood, full affect Neuro- strength and sensation are intact  Labs:   Lab Results  Component Value Date   WBC 31.2 (H) 05/03/2021   HGB 9.0 (L) 05/03/2021   HCT 29.3 (L) 05/03/2021   MCV 89.3 05/03/2021   PLT 173 05/03/2021    Recent Labs  Lab 05/02/21 0910 05/03/21 0538  NA 137 134*  K 3.9 3.0*  CL 111 109  CO2 19* 19*  BUN 20 19  CREATININE 1.48* 1.36*  CALCIUM 7.3* 7.2*  PROT 5.1*  --   BILITOT 0.5  --   ALKPHOS 47  --  ALT 9  --   AST 22  --   GLUCOSE 185* 132*      Radiology/Studies: CT CEREBRAL PERFUSION W CONTRAST  Result Date: 05/01/2021 EXAM: CT PERFUSION BRAIN TECHNIQUE: Multiphase CT imaging of the brain was performed following IV bolus contrast injection. Subsequent parametric perfusion maps were calculated using RAPID  software. CONTRAST:  22mL OMNIPAQUE IOHEXOL 350 MG/ML SOLN COMPARISON:  None. FINDINGS: CT Brain Perfusion Findings: CBF (<30%) Volume: 93mL Perfusion (Tmax>6.0s) volume: 54mL Mismatch Volume: 69mL ASPECTS on noncontrast CT Head: 10 at 1:43 p.m. today. Infarct Core: 13 mL Infarction Location:Right MCA territory, in the right posterior temporal, posterior frontal, and anterior parietal lobes. IMPRESSION: 13 mL infarct core, involving the right MCA territory in the right posterior temporal, posterior frontal, and anterior parietal lobes. Electronically Signed   By: Merilyn Baba M.D.   On: 05/01/2021 16:56   DG Chest Port 1 View  Result Date: 05/02/2021 CLINICAL DATA:  PICC line placement. EXAM: PORTABLE CHEST 1 VIEW COMPARISON:  Same day. FINDINGS: Stable cardiomediastinal silhouette. Interval placement of right-sided PICC line with distal tip in expected position of the SVC. IMPRESSION: Interval placement of right-sided PICC line with distal tip in expected position of the SVC. Electronically Signed   By: Marijo Conception M.D.   On: 05/02/2021 16:26   DG Chest Port 1 View  Result Date: 05/02/2021 CLINICAL DATA:  Respiratory failure, pulmonary edema EXAM: PORTABLE CHEST 1 VIEW COMPARISON:  Chest radiograph 05/01/2021 FINDINGS: Median sternotomy wires and mediastinal surgical clips are stable. The cardiomediastinal silhouette is stable. Coarse reticular markings throughout both lungs are again seen, unchanged. There is no new or worsening focal airspace disease. There is no pleural effusion or pneumothorax. The bones are stable. IMPRESSION: Stable chest with no radiographic evidence of acute cardiopulmonary process. Electronically Signed   By: Valetta Mole M.D.   On: 05/02/2021 08:15   DG Chest Port 1 View  Result Date: 05/01/2021 CLINICAL DATA:  Shortness of breath EXAM: PORTABLE CHEST 1 VIEW COMPARISON:  06/20/2020 chest CT, radiograph 05/01/2021, 03/19/2021, 03/24/2020 FINDINGS: Diffuse bilateral  reticular opacity consistent with chronic interstitial lung disease. No confluent acute airspace disease, pleural effusion or pneumothorax. Stable cardiomediastinal silhouette. Post sternotomy changes. Aortic atherosclerosis. IMPRESSION: Chronic interstitial lung disease without acute superimposed airspace disease Electronically Signed   By: Donavan Foil M.D.   On: 05/01/2021 17:36   DG C-Arm 1-60 Min  Result Date: 05/01/2021 CLINICAL DATA:  Cystoscopy with retrograde pyelogram EXAM: DG C-ARM 1-60 MIN FLUOROSCOPY TIME:  Fluoroscopy Time:  14 seconds Radiation Exposure Index (if provided by the fluoroscopic device): 3.49 mGy Number of Acquired Spot Images: 0 COMPARISON:  None. FINDINGS: Multiple images are submitted from a left retrograde pyelogram. No visible filling defect. Final images demonstrate placement of left ureteral stent. IMPRESSION: As above. Electronically Signed   By: Rolm Baptise M.D.   On: 05/01/2021 21:02   ECHOCARDIOGRAM COMPLETE  Result Date: 05/02/2021    ECHOCARDIOGRAM REPORT   Patient Name:   Jenny Kelly Date of Exam: 05/02/2021 Medical Rec #:  [id number]       Height:       61.0 in Accession #:    [id number]      Weight:       138.9 lb Date of Birth:  1946-01-03        BSA:          1.618 m Patient Age:    64 years  BP:           104/56 mmHg Patient Gender: F               HR:           46 bpm. Exam Location:  Inpatient Procedure: 2D Echo, Cardiac Doppler and Color Doppler Indications:    Stroke  History:        Patient has no prior history of Echocardiogram examinations.                 Arrythmias:Bradycardia; Signs/Symptoms:Shortness of Breath.  Sonographer:    Roosvelt Maser RDCS Referring Phys: [id number] JINDONG XU IMPRESSIONS  1. Left ventricular ejection fraction, by estimation, is 60 to 65%. The left ventricle has normal function. The left ventricle has no regional wall motion abnormalities. Left ventricular diastolic parameters are consistent with Grade II diastolic  dysfunction (pseudonormalization). Elevated left atrial pressure.  2. Right ventricular systolic function is normal. The right ventricular size is normal. There is normal pulmonary artery systolic pressure.  3. The mitral valve is normal in structure. No evidence of mitral valve regurgitation. No evidence of mitral stenosis.  4. The aortic valve is normal in structure. Aortic valve regurgitation is trivial. No aortic stenosis is present.  5. The inferior vena cava is normal in size with <50% respiratory variability, suggesting right atrial pressure of 8 mmHg. FINDINGS  Left Ventricle: Mitral inflow "U" wave is seen. Left ventricular ejection fraction, by estimation, is 60 to 65%. The left ventricle has normal function. The left ventricle has no regional wall motion abnormalities. The left ventricular internal cavity size was normal in size. There is no left ventricular hypertrophy. Left ventricular diastolic parameters are consistent with Grade II diastolic dysfunction (pseudonormalization). Elevated left atrial pressure. Right Ventricle: The right ventricular size is normal. No increase in right ventricular wall thickness. Right ventricular systolic function is normal. There is normal pulmonary artery systolic pressure. The tricuspid regurgitant velocity is 2.50 m/s, and  with an assumed right atrial pressure of 8 mmHg, the estimated right ventricular systolic pressure is 33.0 mmHg. Left Atrium: Left atrial size was normal in size. Right Atrium: Right atrial size was normal in size. Pericardium: There is no evidence of pericardial effusion. Mitral Valve: The mitral valve is normal in structure. No evidence of mitral valve regurgitation. No evidence of mitral valve stenosis. Tricuspid Valve: The tricuspid valve is normal in structure. Tricuspid valve regurgitation is trivial. No evidence of tricuspid stenosis. Aortic Valve: The aortic valve is normal in structure. Aortic valve regurgitation is trivial. No aortic  stenosis is present. Aortic valve mean gradient measures 2.0 mmHg. Aortic valve peak gradient measures 4.4 mmHg. Aortic valve area, by VTI measures 1.72 cm. Pulmonic Valve: The pulmonic valve was normal in structure. Pulmonic valve regurgitation is not visualized. No evidence of pulmonic stenosis. Aorta: The aortic root is normal in size and structure. Venous: The inferior vena cava is normal in size with less than 50% respiratory variability, suggesting right atrial pressure of 8 mmHg. IAS/Shunts: No atrial level shunt detected by color flow Doppler.  LEFT VENTRICLE PLAX 2D LVIDd:         5.60 cm   Diastology LVIDs:         3.60 cm   LV e' medial:    5.00 cm/s LV PW:         0.90 cm   LV E/e' medial:  19.0 LV IVS:        0.80 cm  LV e' lateral:   7.51 cm/s LVOT diam:     1.80 cm   LV E/e' lateral: 12.7 LV SV:         45 LV SV Index:   28 LVOT Area:     2.54 cm  RIGHT VENTRICLE            IVC RV Basal diam:  3.30 cm    IVC diam: 1.90 cm RV S prime:     8.06 cm/s TAPSE (M-mode): 1.6 cm LEFT ATRIUM             Index        RIGHT ATRIUM           Index LA diam:        3.90 cm 2.41 cm/m   RA Area:     22.30 cm LA Vol (A2C):   50.0 ml 30.91 ml/m  RA Volume:   74.70 ml  46.18 ml/m LA Vol (A4C):   44.3 ml 27.39 ml/m LA Biplane Vol: 48.6 ml 30.04 ml/m  AORTIC VALVE AV Area (Vmax):    1.74 cm AV Area (Vmean):   1.67 cm AV Area (VTI):     1.72 cm AV Vmax:           105.00 cm/s AV Vmean:          69.200 cm/s AV VTI:            0.264 m AV Peak Grad:      4.4 mmHg AV Mean Grad:      2.0 mmHg LVOT Vmax:         71.70 cm/s LVOT Vmean:        45.400 cm/s LVOT VTI:          0.178 m LVOT/AV VTI ratio: 0.67  AORTA Ao Root diam: 3.40 cm Ao Asc diam:  3.30 cm MITRAL VALVE               TRICUSPID VALVE MV Area (PHT): 5.66 cm    TR Peak grad:   25.0 mmHg MV Decel Time: 134 msec    TR Vmax:        250.00 cm/s MV E velocity: 95.20 cm/s MV A velocity: 68.40 cm/s  SHUNTS MV E/A ratio:  1.39        Systemic VTI:  0.18 m                             Systemic Diam: 1.80 cm Dani Gobble Croitoru MD Electronically signed by Sanda Bristal Steffy MD Signature Date/Time: 05/02/2021/12:46:12 PM    Final    [place] EKG SITE RITE  Result Date: 05/02/2021 If Site Rite image not attached, placement could not be confirmed due to current cardiac rhythm.   EKG: on arrival appears to show sinus tach vs AF (difficult baseline) at 109 bpm (personally reviewed) EKG 1/10 read as AF but actually appears artifact Repeat EKG shows NSR  TELEMETRY: NSR 60-70s currently, sinus brady 40s mostly overnight.  WCT from yesterday reviewed with Dr. Caryl Comes. Initial deflection same as sinus, with no clear long shorting. Very regular. Less likely AF with RVR, More likely slow VT (personally reviewed)  Assessment/Plan: 1.  Bradycardia Stable NSR 60-70s currently, bradycardia overnight and yesterday in setting of acute illness Follow.  No current indication for pacing, and contraindicated with active bacteremia.  2. Atrial fibrillation Unlikely that 50 mg of amiodarone was providing any significant affect. She does not  remember having any persistent atrial fibrillation, and thinks that she has been on this for several years due to post op AF after CABG. She was on Xarelto prior to this event for h/o PE.  3. WCT Start of rhythm with PAC, then into WCT with same initial deflection as sinus beats.  ?VT, can't necessarily rule out SVT.  This occurred in setting of severe illness on dopamine and has not occurred.  Follow.  Keep K > 4.0 and Mg > 2.0  K 3.0. Ordered for 80 mew K total today. Will need more. Would recheck BMET this evening to help guide Mg 3.5 last night, suspect this is erroneous and drawn while receiving. Repeat.   4. Hypokalemia Supp ongoing. Would recheck BMET in afternoon to help guide.   Currently the most likely explanation of her transient bradycardia and her WCT are her acute illness. Suspect that both will improve as she convalesces. Would continue  to hold her low dose amiodarone for now. Little history re: her AF is available. She has been on amiodarone since at least 2017 per care-everywhere notes.   For questions or updates, please contact [place] Please consult [email or web address] for contact info under Cardiology/STEMI.  Signed, Shirley Friar, PA-C  05/03/2021 10:24 AM  Wide-complex tachycardia almost certainly ventricular tachycardia (should not have abberated if the beat before did not aberrate/blood pressure significantly decreased/no antecedent P waves/very regular)  Sinus bradycardia here and also previously symptomatic with heart rates recorded at home with fatigue and lassitude in the 20s and 30s  Ectopic atrial rhythm  Atrial fibrillation by history  Seizure disorder  Coronary artery disease with prior bypass  Stroke  Pulmonary embolism on Xarelto  E. coli bacteremia in the setting of kidney stone   The patient has bradycardia which is problematic and sustained ventricular tachycardia in the setting of normal LV function the normal therapy for which would be beta-blockade.  Given the bradycardia, it is hard to begin such therapy without backup bradycardia pacing, and would not use amiodarone.  There is no immediate need for pacing, and in the setting of her E. coli bacteremia, would defer any  invasive procedure.  For now we will continue to follow from a distance     Call in the interim for serious heart rhyhm issues

## 2021-05-03 NOTE — Progress Notes (Addendum)
[place] for Heparin Indication: atrial fibrillation  Allergies  Allergen Reactions   Ciprofloxacin Hives    Ask patient   Prochlorperazine Edisylate Other (See Comments)    Cant swallow Muscle cramping   Prednisone    Reduced Iso-Alpha Acids Complex    Statins Other (See Comments)    Muscle weakness    Sulfa Antibiotics Swelling   Azithromycin Rash   Latex Rash    Patient Measurements: Height: 5\' 1"  (154.9 cm) Weight: 70.3 kg (154 lb 15.7 oz) IBW/kg (Calculated) : 47.8 Heparin Dosing Weight: 60.7 kg  Vital Signs: Temp: 97.7 F (36.5 C) (01/11 0400) Temp Source: Oral (01/11 0400) BP: 142/44 (01/11 0600) Pulse Rate: 42 (01/11 0600)  Labs: Recent Labs    05/01/21 1558 05/01/21 2322 05/02/21 0910 05/02/21 1140 05/03/21 0538  HGB 12.3 11.1* 10.0*  --  9.0*  HCT 42.2 36.6 32.2*  --  29.3*  PLT 133* 168 224  --  173  APTT  --  61*  --  108* 127*  LABPROT  --  17.5*  --   --   --   INR  --  1.4*  --   --   --   HEPARINUNFRC  --  0.59  --  0.37 0.34  CREATININE 1.76* 1.83* 1.48*  --  1.36*  TROPONINIHS 24*  --   --   --   --      Estimated Creatinine Clearance: 32 mL/min (A) (by C-G formula based on SCr of 1.36 mg/dL (H)).   Assessment: 89 YOF with a history of CKDIII, polypharmacy, T2DM, hypothyroidism, HLD, CAD, AF on Xarelto, S/P CABG, tremor and stroke. Patient is presenting with AMS. Heparin per pharmacy consult placed for possible CVA. Now indicated for AF.  Patient is on Xarelto prior to arrival (LD 1/8 at 1300).   Heparin level decreased to 0.34 units/mL and aPTT increased to supra-therapeutic level of 127 sec.  Heparin and aPTT correlating in opposite direction for the past two labs. Heparin level currently therapeutic and may be a more accurate measurement for therapeutic adjustments   Lab obtained correctly and no bleeding per discussion with overnight RN.  Goal of Therapy:  Heparin level 0.3-0.5  units/ml aPTT 66-85 seconds to err on safe side Monitor platelets by anticoagulation protocol: Yes   Plan:  Continue heparin infusion at 1050 units/hr Daily heparin level and CBC Monitor for bleeding  [place] D. Mina Marble, PharmD, BCPS, [place] 05/03/2021, 2:55 PM

## 2021-05-04 ENCOUNTER — Inpatient Hospital Stay (HOSPITAL_COMMUNITY): Payer: Medicare HMO

## 2021-05-04 DIAGNOSIS — I63311 Cerebral infarction due to thrombosis of right middle cerebral artery: Secondary | ICD-10-CM | POA: Diagnosis not present

## 2021-05-04 DIAGNOSIS — R001 Bradycardia, unspecified: Secondary | ICD-10-CM | POA: Diagnosis not present

## 2021-05-04 DIAGNOSIS — I63511 Cerebral infarction due to unspecified occlusion or stenosis of right middle cerebral artery: Secondary | ICD-10-CM | POA: Diagnosis not present

## 2021-05-04 DIAGNOSIS — I480 Paroxysmal atrial fibrillation: Secondary | ICD-10-CM | POA: Diagnosis not present

## 2021-05-04 DIAGNOSIS — I639 Cerebral infarction, unspecified: Secondary | ICD-10-CM | POA: Diagnosis not present

## 2021-05-04 LAB — CBC WITH DIFFERENTIAL/PLATELET
Abs Immature Granulocytes: 0.24 10*3/uL — ABNORMAL HIGH (ref 0.00–0.07)
Basophils Absolute: 0.1 10*3/uL (ref 0.0–0.1)
Basophils Relative: 0 %
Eosinophils Absolute: 0.5 10*3/uL (ref 0.0–0.5)
Eosinophils Relative: 3 %
HCT: 30.2 % — ABNORMAL LOW (ref 36.0–46.0)
Hemoglobin: 8.8 g/dL — ABNORMAL LOW (ref 12.0–15.0)
Immature Granulocytes: 2 %
Lymphocytes Relative: 15 %
Lymphs Abs: 2.2 10*3/uL (ref 0.7–4.0)
MCH: 26.7 pg (ref 26.0–34.0)
MCHC: 29.1 g/dL — ABNORMAL LOW (ref 30.0–36.0)
MCV: 91.8 fL (ref 80.0–100.0)
Monocytes Absolute: 0.8 10*3/uL (ref 0.1–1.0)
Monocytes Relative: 5 %
Neutro Abs: 11.2 10*3/uL — ABNORMAL HIGH (ref 1.7–7.7)
Neutrophils Relative %: 75 %
Platelets: 127 10*3/uL — ABNORMAL LOW (ref 150–400)
RBC: 3.29 MIL/uL — ABNORMAL LOW (ref 3.87–5.11)
RDW: 20.5 % — ABNORMAL HIGH (ref 11.5–15.5)
WBC: 15 10*3/uL — ABNORMAL HIGH (ref 4.0–10.5)
nRBC: 0 % (ref 0.0–0.2)

## 2021-05-04 LAB — MAGNESIUM: Magnesium: 2.3 mg/dL (ref 1.7–2.4)

## 2021-05-04 LAB — CULTURE, BLOOD (ROUTINE X 2): Special Requests: ADEQUATE

## 2021-05-04 LAB — BASIC METABOLIC PANEL
Anion gap: 10 (ref 5–15)
BUN: 13 mg/dL (ref 8–23)
CO2: 20 mmol/L — ABNORMAL LOW (ref 22–32)
Calcium: 7.8 mg/dL — ABNORMAL LOW (ref 8.9–10.3)
Chloride: 114 mmol/L — ABNORMAL HIGH (ref 98–111)
Creatinine, Ser: 1.14 mg/dL — ABNORMAL HIGH (ref 0.44–1.00)
GFR, Estimated: 50 mL/min — ABNORMAL LOW (ref >60)
Glucose, Bld: 100 mg/dL — ABNORMAL HIGH (ref 70–99)
Potassium: 3.4 mmol/L — ABNORMAL LOW (ref 3.5–5.1)
Sodium: 144 mmol/L (ref 135–145)

## 2021-05-04 LAB — HEPARIN LEVEL (UNFRACTIONATED)
Heparin Unfractionated: 0.15 IU/mL — ABNORMAL LOW (ref 0.30–0.70)
Heparin Unfractionated: <0.1 IU/mL — ABNORMAL LOW (ref 0.30–0.70)

## 2021-05-04 MED ORDER — POTASSIUM CHLORIDE 20 MEQ PO PACK: 40.0000 meq | PACK | ORAL | Status: DC

## 2021-05-04 MED ORDER — POTASSIUM CHLORIDE CRYS ER 20 MEQ PO TBCR
40.0000 meq | EXTENDED_RELEASE_TABLET | ORAL | Status: AC
  Administered 2021-05-04 (×2): 40 meq via ORAL
  Filled 2021-05-04 (×2): qty 2

## 2021-05-04 MED ORDER — ASPIRIN EC 81 MG PO TBEC
81.0000 mg | DELAYED_RELEASE_TABLET | Freq: Every day | ORAL | Status: DC
  Administered 2021-05-04 – 2021-05-07 (×4): 81 mg via ORAL
  Filled 2021-05-04 (×4): qty 1

## 2021-05-04 MED ORDER — CEFAZOLIN SODIUM-DEXTROSE 2-4 GM/100ML-% IV SOLN
2.0000 g | Freq: Three times a day (TID) | INTRAVENOUS | Status: DC
  Administered 2021-05-04 – 2021-05-05 (×2): 2 g via INTRAVENOUS
  Filled 2021-05-04 (×5): qty 100

## 2021-05-04 MED ORDER — SODIUM CHLORIDE 0.9 % IV BOLUS
1000.0000 mL | Freq: Once | INTRAVENOUS | Status: AC
  Administered 2021-05-04: 1000 mL via INTRAVENOUS

## 2021-05-04 NOTE — Progress Notes (Signed)
PT Cancellation Note  Patient Details Name: Jenny Kelly MRN: [id number] DOB: 1946-03-21   Cancelled Treatment:    Reason Eval/Treat Not Completed: Patient at procedure or test/unavailable Patient being taken down to CT. Will re-attempt at later date.   Arham Symmonds A. Dan Humphreys PT, DPT Acute Rehabilitation Services Pager [phone] Office [phone]    Viviann Spare 05/04/2021, 4:29 PM

## 2021-05-04 NOTE — Progress Notes (Signed)
ANTICOAGULATION CONSULT NOTE  Pharmacy Consult for Heparin Indication: atrial fibrillation  Allergies  Allergen Reactions   Ciprofloxacin Hives    Ask patient   Prochlorperazine Edisylate Other (See Comments)    Cant swallow Muscle cramping   Prednisone    Reduced Iso-Alpha Acids Complex    Statins Other (See Comments)    Muscle weakness    Sulfa Antibiotics Swelling   Azithromycin Rash   Latex Rash    Patient Measurements: Height: 5\' 1"  (154.9 cm) Weight: 68.2 kg (150 lb 5.7 oz) IBW/kg (Calculated) : 47.8 Heparin Dosing Weight: 60.7 kg  Vital Signs: Temp: 98.4 F (36.9 C) (01/12 1600) Temp Source: Oral (01/12 1600) BP: 123/68 (01/12 1900) Pulse Rate: 77 (01/12 1900)  Labs: Recent Labs    05/01/21 2322 05/02/21 0910 05/02/21 1140 05/03/21 0538 05/04/21 0409 05/04/21 0529 05/04/21 1818  HGB 11.1* 10.0*  --  9.0*  --  8.8*  --   HCT 36.6 32.2*  --  29.3*  --  30.2*  --   PLT 168 224  --  173  --  127*  --   APTT 61*  --  108* 127*  --   --   --   LABPROT 17.5*  --   --   --   --   --   --   INR 1.4*  --   --   --   --   --   --   HEPARINUNFRC 0.59  --  0.37 0.34 0.15*  --  <0.10*  CREATININE 1.83* 1.48*  --  1.36*  --  1.14*  --      Estimated Creatinine Clearance: 37.7 mL/min (A) (by C-G formula based on SCr of 1.14 mg/dL (H)).   Assessment: 59 YOF with a history of CKDIII, polypharmacy, T2DM, hypothyroidism, HLD, CAD, AF on Xarelto, S/P CABG, tremor and stroke. Patient is presenting with AMS. Heparin per pharmacy consult placed for possible CVA. Now indicated for AF.  Patient is on Xarelto prior to arrival (LD 1/8 at 1300).   Heparin level down to undetectable on infusion at 1150 units/hr. No issues with line or bleeding reported per RN. Noted plan to switch back to Xarelto as soon as urology has r/o further procedures.  Goal of Therapy:  Heparin level 0.3-0.5 units Monitor platelets by anticoagulation protocol: Yes   Plan:  Increase heparin  infusion to 1300 units/hr Recheck heparin level in 8 hours F/u ability to change back to Xarelto  3/8, PharmD, BCPS Please see amion for complete clinical pharmacist phone list 05/04/2021 7:54 PM

## 2021-05-04 NOTE — Progress Notes (Addendum)
NAME:  Jenny Kelly, MRN:  [id number], DOB:  1946-03-13, LOS: 3 ADMISSION DATE:  05/01/2021, CONSULTATION DATE:  05/01/21 REFERRING MD:  Cheral Marker CHIEF COMPLAINT:  AMS   History of Present Illness:  Jenny Kelly is a 76 y.o. female who has a PMH as outlined below including A. fib (on Xarelto).  She presented as transfer from [place] [facility] on 1/9 with acute left hemiplegia.  She apparently had dysuria 3 days prior Friday 1/6 and took AZO with improvement in symptoms by Sunday 1/8.  Morning of 1/9, she had generalized weakness and left-sided hemiplegia.  She was taken to [facility] as a code stroke.  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  She was then transferred to [facility] for further evaluation and management.  In route to ED, she received multiple fluid boluses for concern of UTI/sepsis.  She then developed dyspnea and respiratory distress and was placed on BiPAP.  There was also concern that she might have had an aspiration event prior to being placed on BiPAP.  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  She was evaluated by neurology who did not feel that she was a candidate for any intervention.  She was subsequently admitted to the neuro ICU for ongoing management.  Pertinent  Medical History:  has CKD (chronic kidney disease), stage III ([place]); Polypharmacy; Post-concussion headache; Type 2 diabetes mellitus without complication ([place]); Hypothyroidism; Hyperlipidemia; Coronary artery disease involving native coronary artery of native heart; PAF (paroxysmal atrial fibrillation) ([place]); Memory deficit; Risk for falls; S/P CABG (coronary artery bypass graft); Tremor; Stroke [facility]); Sepsis with acute hypoxic respiratory failure without septic shock ([place]); Hyperkalemia; AKI (acute kidney injury) ([place]); Bradycardia; and Wide-complex tachycardia on their problem list.  Significant Hospital Events: Including  procedures, antibiotic start and stop dates in addition to other pertinent events   1/9 transfer from [facility] as code stroke but CT perfusion shows completed right frontoparietal infarct. 1/9 found to have 7 mm left ureteral stone.  Cystoscopy with left ureteral stent placement and left retrograde pyelogram. 1/10 echo shows normal LVEF with grade 2 diastolic dysfunction.  1/12 E. coli sensitive to cefazolin.  Interim History / Subjective:   Back on norepinephrine transiently morning.  Continues to complain of suprapubic pain despite Foley catheter removal.  Continues to have loose stools although this appears to be somewhat chronic since having had COVID several months ago.  Objective:  Blood pressure (!) 126/58, pulse 68, temperature 98.3 F (36.8 C), temperature source Axillary, resp. rate 13, height 5\' 1"  (1.549 m), weight 68.2 kg, SpO2 98 %.        Intake/Output Summary (Last 24 hours) at 05/04/2021 1257 Last data filed at 05/04/2021 1100 Gross per 24 hour  Intake 1685.54 ml  Output --  Net 1685.54 ml    Filed Weights   05/01/21 1821 05/03/21 0500 05/04/21 0500  Weight: 63 kg 70.3 kg 68.2 kg    Examination: General: Elderly female, resting in bed, in NAD. Neuro: Awake and follows commands.  Left-sided weakness is improving HEENT: [place]/AT. Sclerae anicteric.  On 3 L Cardiovascular: IRIR, no M/R/G.  Lungs: Respirations even and unlabored.   Abdomen: CVA tenderness has resolved.  Suprapubic tenderness persists Musculoskeletal: No gross deformities, no edema.  Skin: Intact, warm, no rashes  Labs/imaging personally reviewed:  CT head/CTA 1/9 (per report) > right M1 and M2 occlusion. CTP 1/9 >  13 mL core infarct small area of penumbra Improving creatinine to 1.14 Improving leukocytosis 15.0 E. 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 -Wean oxygen as tolerated.  Progressive ambulation -Vagal stimulation from intra-abdominal infection.  Continue to hold amiodarone but has improved since Foley removal. -CT abdomen pelvis for persistent suprapubic pain - On heparin for Afib.  -We will contact urology for definitive management. -Loperamide for diarrhea.  Addendum:  CT negative Urology to advise regarding stone management prior to transitioning back to NOAC  Best practice (evaluated daily):  Diet/type: Oral diet DVT prophylaxis: systemic heparin GI prophylaxis: N/A Lines: Needs PICC Foley: Foley catheter in place Code Status:  limited - no CPR or defibs.  Short term intubation OK.  ACLS meds OK. Last date of multidisciplinary goals of care discussion: None yet.   Kipp Brood, MD [facility] ICU Physician [place]  Pager: [phone] Mobile: [phone] After hours: [phone].

## 2021-05-04 NOTE — PMR Pre-admission (Signed)
PMR Admission Coordinator Pre-Admission Assessment  Patient: Jenny Kelly is an 76 y.o., female MRN: [id number] DOB: 05-18-1945 Height: 5\' 1"  (154.9 cm) Weight: 67 kg  Insurance Information HMO: yes    PPO:      PCP:      IPA:      80/20:      OTHER:  PRIMARY: [employer]      Policy#: [id number]      Subscriber: pt CM Name: Thora Lance     Phone#: [phone] ext [phone]  Fax#: [phone] Pre-Cert#: [id number]  Received approval on 05/05/21 from Wyona Almas    Employer:  Benefits:  Phone #: [phone]     Name: 1/12 Eff. Date: 04/23/2021     Deduct: none      Out of Pocket Max: $3400      Life Max: none CIR: $295 co pay per day days 1 until 6      SNF: no co pay per day days 1 until 20; $196 co pay per day days 21 until 100 Outpatient: $10 to $20 co pay per visit     Co-Pay: visits per medical neccesity Home Health: 100%      Co-Pay: visits per medical neccesity DME: 80%     Co-Pay: 20% Providers: in network  SECONDARY: none  Financial Counselor:       Phone#:   The [employer] for patients in Inpatient Rehabilitation Facilities with attached Privacy Act [place] Records was provided and verbally reviewed with: Patient and Family  Emergency Contact Information Contact Information     Name Relation Home Work Mobile   [place] Spouse [phone]  [phone]   [name], [name]   [phone]      Current Medical History  Patient Admitting Diagnosis: CVA  History of Present Illness: 76 year old female with history of a fib, CKD stage 3, Type 2 DM, hypothyroidism, HLD, CAD, memory deficits, CABG, tremor and CVA. Presented on 05/01/2021 form Outside hospital /[place] with acute left hemiplegia. Dysuria 3 days. CTA showed acute M1 and M2 occlusion  and UA consistent was UTI. Concern for UTI/sepsis. Developed dyspnea and respiratory distress and place on BIPAP. There was a question of aspiration and she was placed on  BIPAP.  CTA head and neck acute right M! Occlusion and severe stenosis of the right M2 more distally. MRI acute infarcts involving the insula, anterior temporal lobe and inferior frontal lobe consistent with known recent MCA occlusions. 2 d echo EF 60 to 65%. LDL 98 Hgb A1c 5.9/ VTE prophylaxis with SCDs. Xarelto prior to admit. Now on Heparin IV and plan to switch to Xarelto or Eliquis.   Treated for Bradycardia and hypotension treated with Dopamine but discontinued after 25 beat run VT. Atropine uses . Transitioned to  4 liters [place]. Atrial fibrillation with home meds of Amiodarone. Statin intolerance listed for muscle weakness. Will try low intensity pravastin . ON CPAP at home for sleep apnea. Pulmonary fibrosis as residual COVID in 2021. Leukocytosis on Zosyn. Ureteral stone with UTI and found to have 7 mm left ureteral stone. Cystoscopy with left ureteral stent placement and left retrograde pyelogram.   Complete NIHSS TOTAL: 1  Patient's medical record from [facility] has been reviewed by the rehabilitation admission coordinator and physician.  Past Medical History  Past Medical History:  Diagnosis Date   Allergy    Anemia    Anxiety    Arthritis    Asthma    Atrial  fibrillation ([place])    Blood transfusion without reported diagnosis    Cataract    CHF (congestive heart failure) (HCC)    Chronic kidney disease    Clotting disorder (HCC)    COPD (chronic obstructive pulmonary disease) (HCC)    Depression    Diabetes mellitus without complication (HCC)    GERD (gastroesophageal reflux disease)    Heart murmur    Hyperlipidemia    Myocardial infarction [facility])    Neuromuscular disorder ([place])    tremors   Seizures ([place])    Sleep apnea    Stroke [facility])    Thyroid disease    Has the patient had major surgery during 100 days prior to admission? Yes  Family History   Family history is unknown by patient.  Current Medications  Current Facility-Administered Medications:     0.9 %  sodium chloride infusion, 250 mL, Intravenous, Continuous, Agarwala, Ravi, MD, Stopped at 05/02/21 1516   acetaminophen (TYLENOL) tablet 650 mg, 650 mg, Oral, Q4H PRN, Agarwala, Ravi, MD, 650 mg at 05/06/21 0122   albuterol (PROVENTIL) (2.5 MG/3ML) 0.083% nebulizer solution 2.5 mg, 2.5 mg, Nebulization, Q4H PRN, Agarwala, Ravi, MD, 2.5 mg at 05/03/21 1747   aspirin EC tablet 81 mg, 81 mg, Oral, Daily, Agarwala, Ravi, MD, 81 mg at 05/06/21 0847   cephALEXin (KEFLEX) capsule 500 mg, 500 mg, Oral, Q8H, Adhikari, Amrit, MD, 500 mg at 05/07/21 0556   Chlorhexidine Gluconate Cloth 2 % PADS 6 each, 6 each, Topical, Daily, Agarwala, Ravi, MD, 6 each at 05/06/21 0847   docusate sodium (COLACE) capsule 100 mg, 100 mg, Oral, BID PRN, Agarwala, Ravi, MD   donepezil (ARICEPT) tablet 10 mg, 10 mg, Oral, QHS, Agarwala, Ravi, MD, 10 mg at 05/06/21 2238   feeding supplement (ENSURE ENLIVE / ENSURE PLUS) liquid 237 mL, 237 mL, Oral, BID BM, Agarwala, Ravi, MD, 237 mL at 05/06/21 1520   fluticasone furoate-vilanterol (BREO ELLIPTA) 200-25 MCG/ACT 1 puff, 1 puff, Inhalation, Daily, Agarwala, Ravi, MD, 1 puff at 05/07/21 0848   Gerhardt's butt cream, , Topical, BID, Kipp Brood, MD, Given at 05/06/21 2238   guaiFENesin (MUCINEX) 12 hr tablet 1,200 mg, 1,200 mg, Oral, BID PRN, Agarwala, Ravi, MD, 1,200 mg at 05/04/21 1116   levothyroxine (SYNTHROID) tablet 88 mcg, 88 mcg, Oral, QAC breakfast, Agarwala, Ravi, MD, 88 mcg at 05/07/21 0556   loperamide (IMODIUM) capsule 2 mg, 2 mg, Oral, PRN, Kipp Brood, MD, 2 mg at 05/04/21 0937   montelukast (SINGULAIR) tablet 10 mg, 10 mg, Oral, QHS, Agarwala, Ravi, MD, 10 mg at 05/06/21 2237   phenazopyridine (PYRIDIUM) tablet 100 mg, 100 mg, Oral, PRN, Agarwala, Ravi, MD, 100 mg at 05/04/21 0601   pravastatin (PRAVACHOL) tablet 40 mg, 40 mg, Oral, q1800, Agarwala, Ravi, MD, 40 mg at 05/06/21 1853   pregabalin (LYRICA) capsule 50 mg, 50 mg, Oral, BID, Agarwala, Ravi, MD, 50  mg at 05/06/21 2238   rivaroxaban (XARELTO) tablet 20 mg, 20 mg, Oral, Daily, Adhikari, Amrit, MD, 20 mg at 05/06/21 0847   sodium chloride flush (NS) 0.9 % injection 10-40 mL, 10-40 mL, Intracatheter, Q12H, Agarwala, Ravi, MD, 10 mL at 05/06/21 0848   sodium chloride flush (NS) 0.9 % injection 10-40 mL, 10-40 mL, Intracatheter, PRN, Agarwala, Ravi, MD   topiramate (TOPAMAX) tablet 200 mg, 200 mg, Oral, BID, Agarwala, Ravi, MD, 200 mg at 05/06/21 2237   umeclidinium bromide (INCRUSE ELLIPTA) 62.5 MCG/ACT 1 puff, 1 puff, Inhalation, Daily, Agarwala, Ravi, MD, 1 puff at 05/07/21  5784  Patients Current Diet:  Diet Order             Diet general           DIET DYS 2 Room service appropriate? Yes with Assist; Fluid consistency: Thin  Diet effective now                  Precautions / Restrictions Precautions Precautions: Fall Precaution Comments: watch O2 Restrictions Weight Bearing Restrictions: No Other Position/Activity Restrictions: swollen LUE   Has the patient had 2 or more falls or a fall with injury in the past year? No  Prior Activity Level Community (5-7x/wk): Mod I with RW, supervision with adls  Prior Functional Level Self Care: Did the patient need help bathing, dressing, using the toilet or eating? Needed some help  Indoor Mobility: Did the patient need assistance with walking from room to room (with or without device)? Needed some help  Stairs: Did the patient need assistance with internal or external stairs (with or without device)? Needed some help  Functional Cognition: Did the patient need help planning regular tasks such as shopping or remembering to take medications? Needed some help  Patient Information Are you of Hispanic, Latino/a,or Spanish origin?: A. No, not of Hispanic, Latino/a, or Spanish origin What is your race?: A. White Do you need or want an interpreter to communicate with a doctor or health care staff?: 0. No  Patient's Response To:   Health Literacy and Transportation Is the patient able to respond to health literacy and transportation needs?: Yes Health Literacy - How often do you need to have someone help you when you read instructions, pamphlets, or other written material from your doctor or pharmacy?: Never In the past 12 months, has lack of transportation kept you from medical appointments or from getting medications?: No In the past 12 months, has lack of transportation kept you from meetings, work, or from getting things needed for daily living?: No  Home Assistive Devices / Equipment Home Equipment: Conservation officer, nature (2 wheels), BSC/3in1, Shower seat  Prior Device Use: Indicate devices/aids used by the patient prior to current illness, exacerbation or injury? Walker  Current Functional Level Cognition  Overall Cognitive Status: Impaired/Different from baseline Orientation Level: Oriented X4 Safety/Judgement: Decreased awareness of safety, Decreased awareness of deficits General Comments: Husband is very helpful and likes to spoil the pt by doing for her. Had a nice converation with him about letting her do some for herself and then assisting when pt gets fatigued.  Cues required for sequencing for transfers.    Extremity Assessment (includes Sensation/Coordination)  Upper Extremity Assessment: LUE deficits/detail LUE Deficits / Details: distal stronger than proximal overall 4/5, fine motor coordination deficits present, FF to approx 45degrees LUE: Shoulder pain with ROM LUE Sensation: WNL LUE Coordination: decreased fine motor, decreased gross motor  Lower Extremity Assessment: Defer to PT evaluation RLE Deficits / Details: grossly 4-/5 LLE Deficits / Details: grossly 4-/5 but with mild coordination deficits LLE Sensation: WNL LLE Coordination: decreased fine motor, decreased gross motor    ADLs  Overall ADL's : Needs assistance/impaired Eating/Feeding: Set up, Sitting Eating/Feeding Details (indicate cue  type and reason): Pt found in bed with husband feeding her.  After pt moved to chair, pt able to finish feeding herself. Grooming: Wash/dry hands, Wash/dry face, Brushing hair, Set up, Sitting Grooming Details (indicate cue type and reason): sitting EOB for appx 4 min to do simple grooming Upper Body Bathing: Moderate assistance, Sitting Upper Body Bathing  Details (indicate cue type and reason): husband assists her at baseline Lower Body Bathing: Moderate assistance, Sitting/lateral leans Lower Body Bathing Details (indicate cue type and reason): Pt is able to perform figure 4 with BLE Upper Body Dressing : Minimal assistance, Sitting Lower Body Dressing: Minimal assistance, Sitting/lateral leans Lower Body Dressing Details (indicate cue type and reason): will likely need more assist for cloting that requires sit<>stand Toilet Transfer: Moderate assistance, Tax adviser Details (indicate cue type and reason): Pt required assist x1 today and transfers to strong side better than weak side. Pt require cues to make sure chair was behind her and cues to reach back for the chair. Toileting- Clothing Manipulation and Hygiene: Moderate assistance, Sit to/from stand Toileting - Clothing Manipulation Details (indicate cue type and reason): assist with cleaning self due to poor standing balance. Pt held to walker while second person cleaned peri area due to inability to let go of walker to clean self. Tub/ Shower Transfer: Moderate assistance, Ambulation, Shower seat Functional mobility during ADLs: Moderate assistance, Standard walker General ADL Comments: L sided weakness, balance, cognition, HOH impacting ADL performance    Mobility  Overal bed mobility: Needs Assistance Bed Mobility: Supine to Sit, Rolling Rolling: Supervision Supine to sit: Min assist, HOB elevated (used bed rails) General bed mobility comments: min A for trunk elevation.    Transfers  Overall transfer level: Needs  assistance Equipment used: 1 person hand held assist Transfers: Sit to/from Stand, Bed to chair/wheelchair/BSC Sit to Stand: Mod assist Bed to/from chair/wheelchair/BSC transfer type:: Stand pivot Stand pivot transfers: Min assist Step pivot transfers: Mod assist, +2 safety/equipment General transfer comment: Light mod A to stand and min A to pivot to recliner chair. Face to face with HHA for transfers    Ambulation / Gait / Stairs / Wheelchair Mobility       Posture / Balance Dynamic Sitting Balance Sitting balance - Comments: close min guard Balance Overall balance assessment: Needs assistance Sitting-balance support: Feet supported, Bilateral upper extremity supported Sitting balance-Leahy Scale: Fair Sitting balance - Comments: close min guard Postural control: Posterior lean Standing balance support: Bilateral upper extremity supported, Single extremity supported Standing balance-Leahy Scale: Poor Standing balance comment: dependent on external support    Special needs/care consideration    Previous Home Environment  Living Arrangements: Spouse/significant other  Lives With: Spouse Available Help at Discharge: Family, Available 24 hours/day Type of Home: House Home Layout: One level Home Access: Level entry Bathroom Shower/Tub: Tub/shower unit, Door [employer] Toilet: Standard Bathroom Accessibility: Yes How Accessible: Accessible via walker Home Care Services: No  Discharge Living Setting Plans for Discharge Living Setting: Patient's home, House, Lives with (comment) (spouse) Type of Home at Discharge: House Discharge Home Layout: One level Discharge Home Access: Level entry Discharge Bathroom Shower/Tub: Tub/shower unit Discharge Bathroom Toilet: Standard Discharge Bathroom Accessibility: Yes How Accessible: Accessible via walker Does the patient have any problems obtaining your medications?: No  Social/Family/Support Systems Patient Roles: Spouse Contact  Information: Fayrene Fearing Anticipated Caregiver: spouse Anticipated Industrial/product designer Information: see contacts Ability/Limitations of Caregiver: none Caregiver Availability: 24/7 Discharge Plan Discussed with Primary Caregiver: Yes Is Caregiver In Agreement with Plan?: Yes Does Caregiver/Family have Issues with Lodging/Transportation while Pt is in Rehab?: No  Goals Patient/Family Goal for Rehab: Mod I to supervision with PT and OT Expected length of stay: ELOS 10 to 12 days Pt/Family Agrees to Admission and willing to participate: Yes Program Orientation Provided & Reviewed with Pt/Caregiver Including Roles  & Responsibilities: Yes  Decrease burden of Care through IP rehab admission: n/a  Possible need for SNF placement upon discharge: not anticipated  Patient Condition: I have reviewed medical records from [facility] , spoken with CM, and patient and spouse. I met with patient at the bedside for inpatient rehabilitation assessment.  Patient will benefit from ongoing PT, OT, and SLP, can actively participate in 3 hours of therapy a day 5 days of the week, and can make measurable gains during the admission.  Patient will also benefit from the coordinated team approach during an Inpatient Acute Rehabilitation admission.  The patient will receive intensive therapy as well as Rehabilitation physician, nursing, social worker, and care management interventions.  Due to bladder management, bowel management, safety, skin/wound care, disease management, medication administration, pain management, and patient education the patient requires 24 hour a day rehabilitation nursing.  The patient is currently min-mod assist overall with mobility and mod assist with basic ADLs.  Discharge setting and therapy post discharge at home with home health is anticipated.  Patient has agreed to participate in the Acute Inpatient Rehabilitation Program and will admit today.  Preadmission Screen Completed By: Danne Baxter RN MSN with updates by Gayland Curry, 05/07/2021 9:23 AM ______________________________________________________________________   Discussed status with Dr. Letta Pate on 05/07/21  at 9:23 AM and received approval for admission today.  Admission Coordinator: Danne Baxter RN MSN with updates by Gayland Curry, MS, CCC-SLP, time 9:23 AM/Date 05/07/21    Assessment/Plan: Diagnosis:R MCA infarct Does the need for close, 24 hr/day Medical supervision in concert with the patient's rehab needs make it unreasonable for this patient to be served in a less intensive setting? Yes Co-Morbidities requiring supervision/potential complications: Type 2 DM, Afib on anticoagulation, pulmonary fibrosis Due to bladder management, bowel management, safety, skin/wound care, disease management, medication administration, pain management, and patient education, does the patient require 24 hr/day rehab nursing? Yes Does the patient require coordinated care of a physician, rehab nurse, PT, OT, and SLP to address physical and functional deficits in the context of the above medical diagnosis(es)? Yes Addressing deficits in the following areas: balance, endurance, locomotion, strength, transferring, bowel/bladder control, bathing, dressing, feeding, grooming, toileting, cognition, swallowing, and psychosocial support Can the patient actively participate in an intensive therapy program of at least 3 hrs of therapy 5 days a week? Yes The potential for patient to make measurable gains while on inpatient rehab is good Anticipated functional outcomes upon discharge from inpatient rehab: modified independent and supervision PT, modified independent and supervision OT, modified independent SLP Estimated rehab length of stay to reach the above functional goals is: 10-12d Anticipated discharge destination: Home 10. Overall Rehab/Functional Prognosis: good   MD Signature: Charlett Blake M.D. [facility] Fellow Am Acad of Phys Med and Rehab Diplomate Am Board of Electrodiagnostic Med Fellow Am Board of Interventional Pain

## 2021-05-04 NOTE — Progress Notes (Signed)
STROKE TEAM PROGRESS NOTE   INTERVAL HISTORY Husband at bedside.  Patient awake alert, orientated x3, still has mild left facial droop and left upper extremity drift.  MRI showed left MCA moderate sized infarct. Now on heparin IV, Dr. Lynetta Mare will contact urology to make sure no further procedure planned, if so, will switch heparin IV back to Xarelto.  Vitals:   05/04/21 1400 05/04/21 1500 05/04/21 1600 05/04/21 1700  BP: 135/77 (!) 134/91 132/61 131/80  Pulse: 68  71 81  Resp: 13  17 16   Temp:   98.4 F (36.9 C)   TempSrc:   Oral   SpO2: 99%  95% 98%  Weight:      Height:       CBC:  Recent Labs  Lab 05/03/21 0538 05/04/21 0529  WBC 31.2* 15.0*  NEUTROABS 29.0* 11.2*  HGB 9.0* 8.8*  HCT 29.3* 30.2*  MCV 89.3 91.8  PLT 173 [id number]*   Basic Metabolic Panel:  Recent Labs  Lab 05/01/21 2322 05/02/21 0910 05/02/21 2055 05/03/21 0538 05/04/21 0529  NA 137   < >  --  134* 144  K 4.2   < >  --  3.0* 3.4*  CL 109   < >  --  109 114*  CO2 18*   < >  --  19* 20*  GLUCOSE 142*   < >  --  132* 100*  BUN 19   < >  --  19 13  CREATININE 1.83*   < >  --  1.36* 1.14*  CALCIUM 8.0*   < >  --  7.2* 7.8*  MG 1.3*  --  3.5*  --  2.3  PHOS 2.9  --   --   --   --    < > = values in this interval not displayed.   Lipid Panel:  Recent Labs  Lab 05/03/21 0538  CHOL 148  TRIG 132  HDL 24*  CHOLHDL 6.2  VLDL 26  LDLCALC 98   HgbA1c:  Recent Labs  Lab 05/03/21 0538  HGBA1C 5.9*   Urine Drug Screen: No results for input(s): LABOPIA, COCAINSCRNUR, LABBENZ, AMPHETMU, THCU, LABBARB in the last 168 hours.  Alcohol Level No results for input(s): ETH in the last 168 hours.  IMAGING past 24 hours MR BRAIN WO CONTRAST  Result Date: 05/03/2021 CLINICAL DATA:  Stroke, follow-up EXAM: MRI HEAD WITHOUT CONTRAST TECHNIQUE: Multiplanar, multiecho pulse sequences of the brain and surrounding structures were obtained without intravenous contrast. COMPARISON:  05/01/2021 CT head and CTA head  and neck, 11/12/2009 MRI FINDINGS: Brain: Confluent restricted diffusion in the insula, anterior temporal lobe, and inferior frontal lobe, with additional less severe restricted diffusion in the right caudate and lentiform nucleus (series 2, images 22-31) consistent with known right MCA occlusion. Additional non contiguous areas of restricted diffusion in the right frontal lobe (series 2, image 32 and series 3, image 24), right temporal lobe (series 2, image 31 and series 3, image 17), and right parietal lobe (series 2, image 33 and series 3, image 14). These areas are associated with increased T2 signal, likely cytotoxic edema, with greater T2 hyperintense signal in the caudate and lentiform nucleus, with mild narrowing of the frontal horn of the right lateral ventricle. No definite acute hemorrhage. No mass or midline shift. Encephalomalacia in the right posterior temporal and right parietal lobes, consistent with remote right MCA territory infarct. No hydrocephalus or extra-axial collection. Vascular: Normal proximal flow voids. Poor visualization of more distal  right MCA flow voids. Skull and upper cervical spine: Normal marrow signal. Sinuses/Orbits: Status post bilateral lens replacements. Air-fluid level in the left maxillary sinus. Mucosal thickening in the right maxillary sinus, right-greater-than-left frontal sinus, bilateral sphenoid sinuses, and throughout the ethmoid air cells. Other: Fluid throughout the bilateral mastoid air cells. IMPRESSION: Acute infarcts involving the insula, anterior temporal lobe, and inferior frontal lobe, consistent with known recent right MCA occlusion, now status post thrombectomy. Additional areas of restricted diffusion in the caudate and lentiform nucleus appear slightly less hyperintense on diffusion-weighted imaging, possibly slightly less acute infarcts. These areas are all associated with various degrees of edema, causing mild narrowing of the frontal horn of the  right lateral ventricle, but without significant mass effect or midline shift. No evidence of hemorrhagic transformation. Electronically Signed   By: Merilyn Baba M.D.   On: 05/03/2021 17:51    Physical Exam  Constitutional: Appears well-developed and well-nourished.  Cardiovascular: Normal rate and regular rhythm.  Respiratory: Effort normal, non-labored breathing  Neuro: awake alert, orientated x4.  No aphasia, fluent language, mild dysarthria, follows simple commands, able to name and repeat.  Visual fields full, no gaze palsy, mild left facial droop, left upper extremity mild drift, 3+/5, otherwise no weakness found right upper and bilateral lower extremities.  Sensation symmetrical, right finger-to-nose intact.   ASSESSMENT/PLAN Ms. HALLI NEIGHBOURS is a 76 y.o. female with history of a.fib (xarelto) Allergy, Anemia, Anxiety, Arthritis, Asthma, Cataract, CHF, Chronic kidney disease, Clotting disorder , COPD, Depression, Diabetes mellitus without complication , GERD  Heart murmur, Hyperlipidemia, Myocardial infarction, Neuromuscular disorder , Seizures , Sleep apnea, Stroke and Thyroid disease. Patient's husband reports Claudeen had dysuria on Friday, and she took AZO OTC, with resolution of sx on Sunday (1/8). This morning, patient was up at 8 AM, ambulated to the restroom without issue. Husband reports her urine smelled "awful." At 9:30 AM, he was unable to get her to help with getting dressed; she was lethargic and weak. He did not notice if the weakness was unilateral or generalized. He then called EMS, who took her to [facility]. Around noon while at [facility], patient was with nonsensical speech and a L facial droop. Teleneurology evaluated the patient. She was not a candidate for TNK due to anticoagulation. CTA of head and neck revealed an acute right M1 occlusion and severe stenosis of the right M2 more distally. The patient was emergently transported to [facility] for CTP and possible  mechanical thrombectomy. Neurology and Neuro IR decided against mechanical thrombectomy given small penumbra.  Patient experienced bradycardia and dopamine was started, she then had a 25 beat run of VT. Dopamine was d/c'd, then she became bradycardic again and atropine was needed. She is currently on norepinephrine and vasopressin for BP and HR control. Cardiology consult placed today. WBC elevated to 45.3, zosyn started and cultures pending.   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 MRI  Acute infarcts involving the insula, anterior temporal lobe, and inferior frontal lobe, consistent with known recent right MCA occlusion. Additional areas of restricted diffusion in the caudate and lentiform nucleus appear slightly less hyperintense on diffusion-weighted imaging, possibly slightly less acute  infarcts.  2D Echo EF 60-65% LDL 98 HgbA1c 5.9 VTE prophylaxis - SCDs Xarelto (rivaroxaban) daily prior to admission, now on heparin IV. Will consider to switch back to Xarelto if no planned procedure from urology. Given severe stenosis of right M1 and M2, will add ASA 81 on tope of AC.  Therapy recommendations:  CIR Disposition:  pending  Bradycardia and Hypotension Dopamine d/c'd 1/10 after 25 beat run of VT  Atropine used for bradycardia 1/10 Off pressor now BP goal 120-150 given right M1 and M2 high grade stenosis Avoid low BP  Respiratory Failure Bipap to 4LNC stable  Atrial fibrillation  Home medications: Amiodarone 50mg  daily and Xarelto 20mg  Currently bradycardic, now improved Cardiology on board On heparin IV, Will consider to switch to Xarelto  if no further urology procedure planned  Ureteral Stone with UTI 1/9 found to have 7 mm left ureteral stone.   Cystoscopy with left ureteral stent placement and left retrograde pyelogram.  Hyperlipidemia Home meds: none LDL 98, goal < 70 Statin intolerance listed for muscle weakness On low intensity pravastatin 40 Continue statin at discharge  Other Stroke Risk Factors Advanced Age >/= 56  Former Cigarette smoker,advised to stop smoking Hx stroke/TIA 2009- Hemorrhagic stroke where her only preceding sx was severe HA, no residual sx. Coronary artery disease 2002- MI with stent placement c/b pulmonary emboli 2015- triple bypass cardiac surgery. Migraines Obstructive sleep apnea, on CPAP at home Congestive heart failure  Other Active Problems Hyperkalemia 5.6-> 3.9->3.0 - supplement Thrombocytopenia plate 224-> [id number] 1.7 Anemia Hb 11.1-10.0-9.0-8.8 Leukocytosis WBC 45.3->31.2->15.0 On zosyn Pulmonary fibrosis- [facility]  Upper lobe predominant emphysema  Residual from COVID in 2021  Hospital day # 3  This patient is critically ill due to sepsis, septic shock, PAF, bradycardia, hypotension, stroke and at significant risk of neurological worsening, death form heart failure, recurrent stroke, hemorrhagic transformation, cardiac arrest, septic shock, seizure. This patient's care requires constant monitoring of vital signs, hemodynamics, respiratory and cardiac monitoring, review of multiple databases, neurological assessment, discussion with family, other specialists and medical decision making of high complexity. I spent 35 minutes of neurocritical care time in the care of this patient. I had long discussion with husband at bedside, updated pt current condition, treatment plan and potential prognosis, and answered all the questions. He expressed understanding and appreciation.   Rosalin Hawking, MD PhD Stroke Neurology 05/04/2021 5:14 PM    To contact Stroke Continuity  provider, please refer to [email or web address] After hours, contact General Neurology

## 2021-05-04 NOTE — Progress Notes (Signed)
ANTICOAGULATION CONSULT NOTE  Pharmacy Consult for Heparin Indication: atrial fibrillation  Allergies  Allergen Reactions   Ciprofloxacin Hives    Ask patient   Prochlorperazine Edisylate Other (See Comments)    Cant swallow Muscle cramping   Prednisone    Reduced Iso-Alpha Acids Complex    Statins Other (See Comments)    Muscle weakness    Sulfa Antibiotics Swelling   Azithromycin Rash   Latex Rash    Patient Measurements: Height: 5\' 1"  (154.9 cm) Weight: 68.2 kg (150 lb 5.7 oz) IBW/kg (Calculated) : 47.8 Heparin Dosing Weight: 60.7 kg  Vital Signs: Temp: 97.8 F (36.6 C) (01/12 0400) Temp Source: Axillary (01/12 0400) BP: 130/59 (01/12 0600) Pulse Rate: 81 (01/12 0600)  Labs: Recent Labs    05/01/21 1558 05/01/21 1558 05/01/21 2322 05/02/21 0910 05/02/21 1140 05/03/21 0538 05/04/21 0409  HGB 12.3  --  11.1* 10.0*  --  9.0*  --   HCT 42.2  --  36.6 32.2*  --  29.3*  --   PLT 133*  --  168 224  --  173  --   APTT  --   --  61*  --  108* 127*  --   LABPROT  --   --  17.5*  --   --   --   --   INR  --   --  1.4*  --   --   --   --   HEPARINUNFRC  --    < > 0.59  --  0.37 0.34 0.15*  CREATININE 1.76*  --  1.83* 1.48*  --  1.36*  --   TROPONINIHS 24*  --   --   --   --   --   --    < > = values in this interval not displayed.     Estimated Creatinine Clearance: 31.6 mL/min (A) (by C-G formula based on SCr of 1.36 mg/dL (H)).   Assessment: 44 YOF with a history of CKDIII, polypharmacy, T2DM, hypothyroidism, HLD, CAD, AF on Xarelto, S/P CABG, tremor and stroke. Patient is presenting with AMS. Heparin per pharmacy consult placed for possible CVA. Now indicated for AF.  Patient is on Xarelto prior to arrival (LD 1/8 at 1300).   Heparin level decreased to a subtherapeutic 0.15 units/mL. Hgb and platelets trending down.   Lab obtained appropriately and no bleeding per discussion with RN.  Goal of Therapy:  Heparin level 0.3-0.5 units/ml aPTT 66-85 seconds  to err on safe side Monitor platelets by anticoagulation protocol: Yes   Plan:  Increase heparin infusion to 1150 units/hr Recheck heparin level in 8 hours Daily heparin level and CBC Monitor for bleeding  Janna Arch

## 2021-05-04 NOTE — Evaluation (Signed)
Clinical/Bedside Swallow Evaluation Patient Details  Name: Jenny Kelly MRN: [id number] Date of Birth: Apr 08, 1946  [name]: 05/04/2021 Time: SLP Start Time (ACUTE ONLY): 1200 SLP Stop Time (ACUTE ONLY): 1215 SLP Time Calculation (min) (ACUTE ONLY): 15 min  Past Medical History:  Past Medical History:  Diagnosis Date   Allergy    Anemia    Anxiety    Arthritis    Asthma    Atrial fibrillation ([place])    Blood transfusion without reported diagnosis    Cataract    CHF (congestive heart failure) (HCC)    Chronic kidney disease    Clotting disorder (HCC)    COPD (chronic obstructive pulmonary disease) (HCC)    Depression    Diabetes mellitus without complication (HCC)    GERD (gastroesophageal reflux disease)    Heart murmur    Hyperlipidemia    Myocardial infarction ([place])    Neuromuscular disorder (HCC)    tremors   Seizures (HCC)    Sleep apnea    Stroke ([place])    Thyroid disease    Past Surgical History:  Past Surgical History:  Procedure Laterality Date   ABDOMINAL HYSTERECTOMY     APPENDECTOMY     bladder tack     CHOLECYSTECTOMY     CORONARY ARTERY BYPASS GRAFT     CYSTOSCOPY W/ URETERAL STENT PLACEMENT Left 05/01/2021   Procedure: CYSTOSCOPY WITH RETROGRADE PYELOGRAM/URETERAL STENT PLACEMENT;  Surgeon: Ceasar Mons, MD;  Location: [place];  Service: Urology;  Laterality: Left;   ESOPHAGOGASTRODUODENOSCOPY ENDOSCOPY     EYE SURGERY     FRACTURE SURGERY Right    3rd finger   JOINT REPLACEMENT Bilateral    knees   TONSILLECTOMY AND ADENOIDECTOMY     TUBAL LIGATION     HPI:  [name] is a 76 y.o. female referred for speech-language and swallow evaluations secondary to stroke. MRI shows, "Acute infarcts involving the insula, anterior temporal lobe, and  inferior frontal lobe, consistent with known recent right MCA  occlusion, now status post thrombectomy. Additional areas of subacute infarcts also noted." Patient's husband reports patient  was lethargic and weak. He did not notice if the weakness was unilateral or generalized.  At [facility], patient presented with nonsensical speech and a L facial droop. Patient's  husband has chief concerns for patient chewing and swallowing safely. Patient's husband reported a history of recurrent PNA and dysphagia following COVID.  Patient has history of a.fib (xarelto) Allergy, Anemia, Anxiety, Arthritis, Asthma, Cataract, CHF, Chronic kidney disease, Clotting disorder , COPD, Depression, Diabetes mellitus without complication , GERD  Heart murmur, Hyperlipidemia, Myocardial infarction, Neuromuscular disorder , Seizures , Sleep apnea, Stroke and Thyroid disease.    Assessment / Plan / Recommendation  Clinical Impression  Pt demonstrates no signs of acute dysphagia following new CVA. Pt does have a history of dysphagia during previous acute illnesses so aspiration precautions and signs of aspiration reviewed with pt and husband. Dys 2 (finely chopped) diet is appropriate given missing denition. No SLP f/u needed otherwise. SLP Visit Diagnosis: Dysphagia, unspecified (R13.10)    Aspiration Risk       Diet Recommendation Dysphagia 2 (Fine chop);Thin liquid   Liquid Administration via: Cup;Straw Medication Administration: Crushed with puree Supervision: Patient able to self feed Compensations: Slow rate;Small sips/bites Postural Changes: Seated upright at 90 degrees    Other  Recommendations Oral Care Recommendations: Oral care BID    Recommendations for follow up therapy are one component of a multi-disciplinary discharge  planning process, led by the attending physician.  Recommendations may be updated based on patient status, additional functional criteria and insurance authorization.  Follow up Recommendations No SLP follow up      Assistance Recommended at Discharge    Functional Status Assessment    Frequency and Duration            Prognosis        Swallow Study   General  HPI: [name] is a 76 y.o. female referred for speech-language and swallow evaluations secondary to stroke. MRI shows, "Acute infarcts involving the insula, anterior temporal lobe, and  inferior frontal lobe, consistent with known recent right MCA  occlusion, now status post thrombectomy. Additional areas of subacute infarcts also noted." Patient's husband reports patient was lethargic and weak. He did not notice if the weakness was unilateral or generalized.  At [facility], patient presented with nonsensical speech and a L facial droop. Patient's  husband has chief concerns for patient chewing and swallowing safely. Patient's husband reported a history of recurrent PNA and dysphagia following COVID.  Patient has history of a.fib (xarelto) Allergy, Anemia, Anxiety, Arthritis, Asthma, Cataract, CHF, Chronic kidney disease, Clotting disorder , COPD, Depression, Diabetes mellitus without complication , GERD  Heart murmur, Hyperlipidemia, Myocardial infarction, Neuromuscular disorder , Seizures , Sleep apnea, Stroke and Thyroid disease. Type of Study: Bedside Swallow Evaluation Previous Swallow Assessment: see impression Diet Prior to this Study: Dysphagia 2 (chopped);Thin liquids Temperature Spikes Noted: No Respiratory Status: Room air History of Recent Intubation: No Behavior/Cognition: Alert;Cooperative;Pleasant mood Oral Cavity Assessment: Within Functional Limits Oral Care Completed by SLP: No Oral Cavity - Dentition: Poor condition;Missing dentition Self-Feeding Abilities: Able to feed self Patient Positioning: Upright in bed Baseline Vocal Quality: Normal Volitional Cough: Strong    Oral/Motor/Sensory Function Overall Oral Motor/Sensory Function: Mild impairment Facial ROM: Within Functional Limits Facial Symmetry: Abnormal symmetry left;Suspected CN VII (facial) dysfunction Facial Strength: Within Functional Limits Facial Sensation: Within Functional Limits Lingual ROM: Within  Functional Limits Lingual Symmetry: Within Functional Limits Lingual Strength: Within Functional Limits Lingual Sensation: Within Functional Limits Velum: Within Functional Limits   Ice Chips Ice chips: Not tested   Thin Liquid Thin Liquid: Within functional limits Presentation: Straw    Nectar Thick Nectar Thick Liquid: Not tested   Honey Thick Honey Thick Liquid: Not tested   Puree Puree: Within functional limits   Solid     Solid: Impaired Presentation: Self Fed Oral Phase Impairments: Impaired mastication      Llewellyn Choplin, Katherene Ponto 05/04/2021,1:11 PM

## 2021-05-04 NOTE — Progress Notes (Signed)
Inpatient Rehabilitation Admissions Coordinator   I met with patient and her spouse at bedside for rehab assessment. We discussed goals and expectations of a possible Cir admit. They would like to discuss further but likely to be interested in a possible Cir admit. I will begin Auth with [employer] for a possible Cir admit .  Danne Baxter, RN, MSN Rehab Admissions Coordinator [phone] 05/04/2021 12:06 PM

## 2021-05-05 DIAGNOSIS — I480 Paroxysmal atrial fibrillation: Secondary | ICD-10-CM | POA: Diagnosis not present

## 2021-05-05 DIAGNOSIS — R001 Bradycardia, unspecified: Secondary | ICD-10-CM | POA: Diagnosis not present

## 2021-05-05 DIAGNOSIS — I63311 Cerebral infarction due to thrombosis of right middle cerebral artery: Secondary | ICD-10-CM | POA: Diagnosis not present

## 2021-05-05 DIAGNOSIS — I63511 Cerebral infarction due to unspecified occlusion or stenosis of right middle cerebral artery: Secondary | ICD-10-CM | POA: Diagnosis not present

## 2021-05-05 LAB — CBC WITH DIFFERENTIAL/PLATELET
Abs Immature Granulocytes: 0.5 10*3/uL — ABNORMAL HIGH (ref 0.00–0.07)
Basophils Absolute: 0.1 10*3/uL (ref 0.0–0.1)
Basophils Relative: 1 %
Eosinophils Absolute: 0.6 10*3/uL — ABNORMAL HIGH (ref 0.0–0.5)
Eosinophils Relative: 5 %
HCT: 30.7 % — ABNORMAL LOW (ref 36.0–46.0)
Hemoglobin: 9.2 g/dL — ABNORMAL LOW (ref 12.0–15.0)
Immature Granulocytes: 4 %
Lymphocytes Relative: 22 %
Lymphs Abs: 2.7 10*3/uL (ref 0.7–4.0)
MCH: 27.3 pg (ref 26.0–34.0)
MCHC: 30 g/dL (ref 30.0–36.0)
MCV: 91.1 fL (ref 80.0–100.0)
Monocytes Absolute: 0.8 10*3/uL (ref 0.1–1.0)
Monocytes Relative: 7 %
Neutro Abs: 7.7 10*3/uL (ref 1.7–7.7)
Neutrophils Relative %: 61 %
Platelets: 124 10*3/uL — ABNORMAL LOW (ref 150–400)
RBC: 3.37 MIL/uL — ABNORMAL LOW (ref 3.87–5.11)
RDW: 20.6 % — ABNORMAL HIGH (ref 11.5–15.5)
WBC: 12.4 10*3/uL — ABNORMAL HIGH (ref 4.0–10.5)
nRBC: 0 % (ref 0.0–0.2)

## 2021-05-05 LAB — BASIC METABOLIC PANEL
Anion gap: 5 (ref 5–15)
BUN: 9 mg/dL (ref 8–23)
CO2: 19 mmol/L — ABNORMAL LOW (ref 22–32)
Calcium: 7.8 mg/dL — ABNORMAL LOW (ref 8.9–10.3)
Chloride: 117 mmol/L — ABNORMAL HIGH (ref 98–111)
Creatinine, Ser: 1.21 mg/dL — ABNORMAL HIGH (ref 0.44–1.00)
GFR, Estimated: 47 mL/min — ABNORMAL LOW (ref >60)
Glucose, Bld: 91 mg/dL (ref 70–99)
Potassium: 4.4 mmol/L (ref 3.5–5.1)
Sodium: 141 mmol/L (ref 135–145)

## 2021-05-05 LAB — HEPARIN LEVEL (UNFRACTIONATED): Heparin Unfractionated: 0.16 IU/mL — ABNORMAL LOW (ref 0.30–0.70)

## 2021-05-05 MED ORDER — CEPHALEXIN 500 MG PO CAPS: 500.0000 mg | ORAL_CAPSULE | Freq: Three times a day (TID) | ORAL | Status: DC

## 2021-05-05 MED ORDER — CEPHALEXIN 500 MG PO CAPS
500.0000 mg | ORAL_CAPSULE | Freq: Three times a day (TID) | ORAL | Status: DC
  Administered 2021-05-05 – 2021-05-07 (×6): 500 mg via ORAL
  Filled 2021-05-05 (×6): qty 1

## 2021-05-05 MED ORDER — RIVAROXABAN 20 MG PO TABS
20.0000 mg | ORAL_TABLET | Freq: Every day | ORAL | Status: DC
  Administered 2021-05-05 – 2021-05-07 (×3): 20 mg via ORAL
  Filled 2021-05-05 (×3): qty 1

## 2021-05-05 MED ORDER — PRAVASTATIN SODIUM 40 MG PO TABS: 40.0000 mg | ORAL_TABLET | Freq: Every day | ORAL | Status: DC

## 2021-05-05 MED ORDER — ASPIRIN 81 MG PO TBEC: 81.0000 mg | DELAYED_RELEASE_TABLET | Freq: Every day | ORAL | 11 refills | Status: DC

## 2021-05-05 NOTE — Progress Notes (Signed)
PT Cancellation Note  Patient Details Name: Jenny Kelly MRN: [id number] DOB: 1945-05-29   Cancelled Treatment:    Reason Eval/Treat Not Completed: Other (comment) Pt eating on first attempt from therapist this afternoon, and asleep on second attempt. Will continue to follow and progress tx while awaiting acute inpatient rehab.   Vickki Muff, PT, DPT   Acute Rehabilitation Department Pager #: [phone]   Ronnie Derby 05/05/2021, 5:56 PM

## 2021-05-05 NOTE — Progress Notes (Incomplete)
PROGRESS NOTE    Jenny Kelly  [id number] DOB: 15-Aug-1945 DOA: 05/01/2021 PCP: Townsend Roger, MD   Chief Complain: Weakness  Brief Narrative:  Patient is a 76 year old female with history of A. Fib, arthritis, asthma, cataract, congestive heart failure, CKD, COPD, diabetes type 2, hyperlipidemia, coronary disease, seizure disorder who presented to [facility] with acute onset of left-sided weakness.  Code stroke was called.  CTA showed acute M1 and M2 occlusion and her UA was consistent with UTI.  She was transferred to [facility] for further evaluation and management.  She received multiple fluid boluses for the concern of UTI, sepsis.  Also found to have a 7 mm left ureteral stone status post cystoscopy with left ureteral stent placement.  She developed respiratory distress and was placed on BiPAP and was also thought to have aspiration event.  CT done here showed right MCA infract, evaluated by neurology.  Hospital course was remarkable for hypotension requiring pressors.    Assessment & Plan:   Principal Problem:   Stroke [facility]) Active Problems:   PAF (paroxysmal atrial fibrillation) (HCC)   Bradycardia   Wide-complex tachycardia   Acute ischemic stroke: Imagings suggestive of right MCA infarct likely due to right M1, proximal M2 high-grade stenosis/occlusion.  MRI showed acute infarct involving the insula, anterior temporal lobe, inferior frontal lobe consistent with right MCA occlusion.  2D echo showed EF of 60 to 65%, LDL of 98, hemoglobin 5.9. Takes Xarelto at home, currently on heparin drip, plan to switch to Xarelto if there is no further plan from urology for procedures.  Neurology also recommended adding aspirin. PT/OT recommended CIR on discharge.  Septic shock/pyelonephritis/left renal calculus: Status post left ureteral stenting.  Found to have 7 mm left ureteral stone.  Blood pressure was low, requiring pressures in ICU.  Off pressors now.  Continue current  antibiotics.  Blood culture showed E. coli. Urology will be contacted regarding further plan.  Paroxysmal A. fib: On amiodarone, Xarelto at home.  Cardiology following.  Heart rate on the lower side.  Atropine was used for bradycardia and 1/10.  Acute respiratory failure: Required BiPAP on presentation.  Currently on full dose of oxygen per minute.  Not on oxygen at home  Hyperlipidemia: LDL of 98.  On pravastatin 40 mg daily.  AKI: Baseline creatinine normal.  Kidney function currently close to baseline.  History of coronary artery disease: Status post bypass surgery, stent placement.  No ischemic symptoms right now.  History of OSA: On CPAP  History of diastolic congestive heart failure: Currently volume.  Normocytic anemia: Currently hemoglobin stable.  Continue to monitor  Leukocytosis: Likely secondary to sepsis.  Improving.  Has mild thrombocytopenia  History of pulmonary fibrosis: Follows with pulmonology as an outpatient.  Had Sidney in 2021.          DVT prophylaxis: Code Status:  Family Communication:  Patient status:  Dispo: The patient is from:               Anticipated d/c is to:               Anticipated d/c date is:   Consultants:   Procedures:  Antimicrobials:  Anti-infectives (From admission, onward)    Start     Dose/Rate Route Frequency Ordered Stop   05/04/21 2200  ceFAZolin (ANCEF) IVPB 2g/100 mL premix        2 g 200 mL/hr over 30 Minutes Intravenous Every 8 hours 05/04/21 1204     05/02/21  2000  cefTRIAXone (ROCEPHIN) 2 g in sodium chloride 0.9 % 100 mL IVPB  Status:  Discontinued        2 g 200 mL/hr over 30 Minutes Intravenous Every 24 hours 05/02/21 1743 05/04/21 1204   05/01/21 1839  piperacillin-tazobactam (ZOSYN) IVPB 3.375 g  Status:  Discontinued        3.375 g 12.5 mL/hr over 240 Minutes Intravenous Every 8 hours 05/01/21 1759 05/02/21 1743       Subjective:   Objective: Vitals:   05/04/21 2000 05/04/21 2137 05/04/21 2340  05/05/21 0324  BP: 137/66 (!) 140/47 131/66 (!) 113/55  Pulse: 80 76 69 71  Resp: 17 15 18 16   Temp: 98.6 F (37 C) 98.7 F (37.1 C) 99 F (37.2 C) 99.8 F (37.7 C)  TempSrc: Oral Oral Oral Oral  SpO2: (!) 89% 100% 99% 96%  Weight:      Height:        Intake/Output Summary (Last 24 hours) at 05/05/2021 0755 Last data filed at 05/05/2021 0650 Gross per 24 hour  Intake 584.99 ml  Output 1875 ml  Net -1290.01 ml   Filed Weights   05/01/21 1821 05/03/21 0500 05/04/21 0500  Weight: 63 kg 70.3 kg 68.2 kg    Examination:  General exam: Overall comfortable, not in distress HEENT: PERRL Respiratory system:  no wheezes or crackles  Cardiovascular system: S1 & S2 heard, RRR.  Gastrointestinal system: Abdomen is nondistended, soft and nontender. Central nervous system: Alert and oriented Extremities: No edema, no clubbing ,no cyanosis Skin: No rashes, no ulcers,no icterus      Data Reviewed: I have personally reviewed following labs and imaging studies  CBC: Recent Labs  Lab 05/01/21 2322 05/02/21 0910 05/03/21 0538 05/04/21 0529 05/05/21 0528  WBC 39.5* 45.3* 31.2* 15.0* 12.4*  NEUTROABS  --  39.5* 29.0* 11.2* 7.7  HGB 11.1* 10.0* 9.0* 8.8* 9.2*  HCT 36.6 32.2* 29.3* 30.2* 30.7*  MCV 91.0 89.2 89.3 91.8 91.1  PLT 168 224 173 127* [id number]*   Basic Metabolic Panel: Recent Labs  Lab 05/01/21 2322 05/02/21 0910 05/02/21 2055 05/03/21 0538 05/04/21 0529 05/05/21 0528  NA 137 137  --  134* 144 141  K 4.2 3.9  --  3.0* 3.4* 4.4  CL 109 111  --  109 114* 117*  CO2 18* 19*  --  19* 20* 19*  GLUCOSE 142* 185*  --  132* 100* 91  BUN 19 20  --  19 13 9   CREATININE 1.83* 1.48*  --  1.36* 1.14* 1.21*  CALCIUM 8.0* 7.3*  --  7.2* 7.8* 7.8*  MG 1.3*  --  3.5*  --  2.3  --   PHOS 2.9  --   --   --   --   --    GFR: Estimated Creatinine Clearance: 35.5 mL/min (A) (by C-G formula based on SCr of 1.21 mg/dL (H)). Liver Function Tests: Recent Labs  Lab 05/01/21 1558  05/02/21 0910  AST 60* 22  ALT 9 9  ALKPHOS 74 47  BILITOT 1.7* 0.5  PROT 5.7* 5.1*  ALBUMIN 2.2* 1.9*   No results for input(s): LIPASE, AMYLASE in the last 168 hours. No results for input(s): AMMONIA in the last 168 hours. Coagulation Profile: Recent Labs  Lab 05/01/21 2322  INR 1.4*   Cardiac Enzymes: No results for input(s): CKTOTAL, CKMB, CKMBINDEX, TROPONINI in the last 168 hours. BNP (last 3 results) No results for input(s): PROBNP in the last 8760 hours.  HbA1C: Recent Labs    05/03/21 0538  HGBA1C 5.9*   CBG: Recent Labs  Lab 05/01/21 2115  GLUCAP 124*   Lipid Profile: Recent Labs    05/03/21 0538  CHOL 148  HDL 24*  LDLCALC 98  TRIG 132  CHOLHDL 6.2   Thyroid Function Tests: No results for input(s): TSH, T4TOTAL, FREET4, T3FREE, THYROIDAB in the last 72 hours. Anemia Panel: No results for input(s): VITAMINB12, FOLATE, FERRITIN, TIBC, IRON, RETICCTPCT in the last 72 hours. Sepsis Labs: Recent Labs  Lab 05/01/21 1558 05/01/21 2322  LATICACIDVEN 6.7* 4.6*    Recent Results (from the past 240 hour(s))  Resp Panel by RT-PCR (Flu A&B, Covid)     Status: None   Collection Time: 05/01/21  4:53 PM   Specimen: Nasopharyngeal(NP) swabs in vial transport medium  Result Value Ref Range Status   SARS Coronavirus 2 by RT PCR NEGATIVE NEGATIVE Final    Comment: (NOTE) SARS-CoV-2 target nucleic acids are NOT DETECTED.  The SARS-CoV-2 RNA is generally detectable in upper respiratory specimens during the acute phase of infection. The lowest concentration of SARS-CoV-2 viral copies this assay can detect is 138 copies/mL. A negative result does not preclude SARS-Cov-2 infection and should not be used as the sole basis for treatment or other patient management decisions. A negative result may occur with  improper specimen collection/handling, submission of specimen other than nasopharyngeal swab, presence of viral mutation(s) within the areas targeted by  this assay, and inadequate number of viral copies(<138 copies/mL). A negative result must be combined with clinical observations, patient history, and epidemiological information. The expected result is Negative.  Fact Sheet for Patients:  EntrepreneurPulse.com.au  Fact Sheet for Healthcare Providers:  IncredibleEmployment.be  This test is no t yet approved or cleared by the [place] FDA and  has been authorized for detection and/or diagnosis of SARS-CoV-2 by FDA under an Emergency Use Authorization (EUA). This EUA will remain  in effect (meaning this test can be used) for the duration of the COVID-19 declaration under Section 564(b)(1) of the Act, 21 U.S.C.section 360bbb-3(b)(1), unless the authorization is terminated  or revoked sooner.       Influenza A by PCR NEGATIVE NEGATIVE Final   Influenza B by PCR NEGATIVE NEGATIVE Final    Comment: (NOTE) The Xpert Xpress SARS-CoV-2/FLU/RSV plus assay is intended as an aid in the diagnosis of influenza from Nasopharyngeal swab specimens and should not be used as a sole basis for treatment. Nasal washings and aspirates are unacceptable for Xpert Xpress SARS-CoV-2/FLU/RSV testing.  Fact Sheet for Patients: EntrepreneurPulse.com.au  Fact Sheet for Healthcare Providers: IncredibleEmployment.be  This test is not yet approved or cleared by the [place] FDA and has been authorized for detection and/or diagnosis of SARS-CoV-2 by FDA under an Emergency Use Authorization (EUA). This EUA will remain in effect (meaning this test can be used) for the duration of the COVID-19 declaration under Section 564(b)(1) of the Act, 21 U.S.C. section 360bbb-3(b)(1), unless the authorization is terminated or revoked.  Performed at [facility], [place] [address] [place], [place] [address]   Urine Culture     Status: Abnormal   Collection Time: 05/01/21  6:44 PM    Specimen: Urine, Catheterized  Result Value Ref Range Status   Specimen Description URINE, CATHETERIZED  Final   Special Requests NONE  Final   Culture (A)  Final    <10,000 COLONIES/mL INSIGNIFICANT GROWTH Performed at [facility], [place] [address] [place], [place]  [id number]    Report Status 05/02/2021 FINAL  Final  Blood culture (routine x 2)     Status: None (Preliminary result)   Collection Time: 05/01/21 11:00 PM   Specimen: BLOOD  Result Value Ref Range Status   Specimen Description BLOOD LEFT ANTECUBITAL  Final   Special Requests IN PEDIATRIC BOTTLE Blood Culture adequate volume  Final   Culture   Final    NO GROWTH 2 DAYS Performed at [facility], [place] [address] [place], [place] [address]    Report Status PENDING  Incomplete  Blood culture (routine x 2)     Status: Abnormal   Collection Time: 05/01/21 11:22 PM   Specimen: BLOOD  Result Value Ref Range Status   Specimen Description BLOOD RIGHT ARM  Final   Special Requests IN PEDIATRIC BOTTLE Blood Culture adequate volume  Final   Culture  Setup Time   Final    GRAM NEGATIVE RODS IN PEDIATRIC BOTTLE CRITICAL RESULT CALLED TO, READ BACK BY AND VERIFIED WITH: Performed at [facility], [place] [address], [place], [place] [address]    Culture ESCHERICHIA COLI (A)  Final   Report Status 05/04/2021 FINAL  Final   Organism ID, Bacteria ESCHERICHIA COLI  Final      Susceptibility   Escherichia coli - MIC*    AMPICILLIN >=32 RESISTANT Resistant     CEFAZOLIN <=4 SENSITIVE Sensitive     CEFEPIME <=0.12 SENSITIVE Sensitive     CEFTAZIDIME <=1 SENSITIVE Sensitive     CEFTRIAXONE <=0.25 SENSITIVE Sensitive     CIPROFLOXACIN 1 RESISTANT Resistant     GENTAMICIN <=1 SENSITIVE Sensitive     IMIPENEM <=0.25 SENSITIVE Sensitive     TRIMETH/SULFA <=20 SENSITIVE Sensitive     AMPICILLIN/SULBACTAM 16 INTERMEDIATE Intermediate     PIP/TAZO <=4 SENSITIVE Sensitive     * ESCHERICHIA COLI  Blood Culture ID Panel  (Reflexed)     Status: Abnormal   Collection Time: 05/01/21 11:22 PM  Result Value Ref Range Status   Enterococcus faecalis NOT DETECTED NOT DETECTED Final   Enterococcus Faecium NOT DETECTED NOT DETECTED Final   Listeria monocytogenes NOT DETECTED NOT DETECTED Final   Staphylococcus species NOT DETECTED NOT DETECTED Final   Staphylococcus aureus (BCID) NOT DETECTED NOT DETECTED Final   Staphylococcus epidermidis NOT DETECTED NOT DETECTED Final   Staphylococcus lugdunensis NOT DETECTED NOT DETECTED Final   Streptococcus species NOT DETECTED NOT DETECTED Final   Streptococcus agalactiae NOT DETECTED NOT DETECTED Final   Streptococcus pneumoniae NOT DETECTED NOT DETECTED Final   Streptococcus pyogenes NOT DETECTED NOT DETECTED Final   A.calcoaceticus-baumannii NOT DETECTED NOT DETECTED Final   Bacteroides fragilis NOT DETECTED NOT DETECTED Final   Enterobacterales DETECTED (A) NOT DETECTED Final    Comment: Enterobacterales represent a large order of gram negative bacteria, not a single organism. CRITICAL RESULT CALLED TO, READ BACK BY AND VERIFIED WITH: PHARMD CAREN AMEND 05/02/21 @1736  BY JW    Enterobacter cloacae complex NOT DETECTED NOT DETECTED Final   Escherichia coli DETECTED (A) NOT DETECTED Final    Comment: CRITICAL RESULT CALLED TO, READ BACK BY AND VERIFIED WITH: PHARMD CAREN AMEND 05/02/21 @1736  BY JW    Klebsiella aerogenes NOT DETECTED NOT DETECTED Final   Klebsiella oxytoca NOT DETECTED NOT DETECTED Final   Klebsiella pneumoniae NOT DETECTED NOT DETECTED Final   Proteus species NOT DETECTED NOT DETECTED Final   Salmonella species NOT DETECTED NOT DETECTED Final   Serratia marcescens NOT DETECTED NOT  DETECTED Final   Haemophilus influenzae NOT DETECTED NOT DETECTED Final   Neisseria meningitidis NOT DETECTED NOT DETECTED Final   Pseudomonas aeruginosa NOT DETECTED NOT DETECTED Final   Stenotrophomonas maltophilia NOT DETECTED NOT DETECTED Final   Candida albicans NOT  DETECTED NOT DETECTED Final   Candida auris NOT DETECTED NOT DETECTED Final   Candida glabrata NOT DETECTED NOT DETECTED Final   Candida krusei NOT DETECTED NOT DETECTED Final   Candida parapsilosis NOT DETECTED NOT DETECTED Final   Candida tropicalis NOT DETECTED NOT DETECTED Final   Cryptococcus neoformans/gattii NOT DETECTED NOT DETECTED Final   CTX-M ESBL NOT DETECTED NOT DETECTED Final   Carbapenem resistance IMP NOT DETECTED NOT DETECTED Final   Carbapenem resistance KPC NOT DETECTED NOT DETECTED Final   Carbapenem resistance NDM NOT DETECTED NOT DETECTED Final   Carbapenem resist OXA 48 LIKE NOT DETECTED NOT DETECTED Final   Carbapenem resistance VIM NOT DETECTED NOT DETECTED Final    Comment: Performed at [facility], [address], [place], [place] [address]  MRSA Next Gen by PCR, Nasal     Status: None   Collection Time: 05/02/21 12:39 AM   Specimen: Nasal Mucosa; Nasal Swab  Result Value Ref Range Status   MRSA by PCR Next Gen NOT DETECTED NOT DETECTED Final    Comment: (NOTE) The GeneXpert MRSA Assay (FDA approved for NASAL specimens only), is one component of a comprehensive MRSA colonization surveillance program. It is not intended to diagnose MRSA infection nor to guide or monitor treatment for MRSA infections. Test performance is not FDA approved in patients less than 2 years old. Performed at [facility], [place] [address] [place], [place] [address]          Radiology Studies: CT ABDOMEN PELVIS WO CONTRAST  Result Date: 05/04/2021 CLINICAL DATA:  Suprapubic pain. EXAM: CT ABDOMEN AND PELVIS WITHOUT CONTRAST TECHNIQUE: Multidetector CT imaging of the abdomen and pelvis was performed following the standard protocol without IV contrast. COMPARISON:  CT abdomen and pelvis dated May 01, 2021. FINDINGS: Lower chest: New trace bilateral pleural effusions with bilateral posterior lower lobe subsegmental atelectasis. Increased peribronchial thickening in  both lower lobes. Unchanged mild chronic interstitial lung disease. Hepatobiliary: No focal liver abnormality is seen. Status post cholecystectomy. No biliary dilatation. Pancreas: Unchanged pancreatic calcifications. No ductal dilatation or surrounding inflammatory changes. Spleen: Normal in size without focal abnormality. Multiple calcified granulomas again noted. Adrenals/Urinary Tract: Adrenal glands and right kidney are unremarkable. Multiple left renal calculi measuring up to 7 mm are unchanged. New left double-J ureteral stent with resolved left hydronephrosis. Unchanged 7 mm calculus in the left proximal ureter. Bladder is unremarkable. Stomach/Bowel: Stomach is within normal limits. Diminutive or absent appendix. No evidence of bowel wall thickening, distention, or inflammatory changes. Vascular/Lymphatic: Aortic atherosclerosis. No enlarged abdominal or pelvic lymph nodes. Reproductive: Status post hysterectomy. No adnexal masses. Other: No abdominal wall hernia or abnormality. No abdominopelvic ascites. No pneumoperitoneum. Musculoskeletal: No acute or significant osseous findings. IMPRESSION: 1. New left double-J ureteral stent with resolved left hydronephrosis. Unchanged 7 mm calculus in the left proximal ureter. 2. Unchanged additional nonobstructive left nephrolithiasis. 3. New trace bilateral pleural effusions with increased peribronchial thickening in both lower lobes. 4. Aortic Atherosclerosis (ICD10-I70.0). Electronically Signed   By: Titus Dubin M.D.   On: 05/04/2021 17:20   MR BRAIN WO CONTRAST  Result Date: 05/03/2021 CLINICAL DATA:  Stroke, follow-up EXAM: MRI HEAD WITHOUT CONTRAST TECHNIQUE: Multiplanar, multiecho pulse sequences of the  brain and surrounding structures were obtained without intravenous contrast. COMPARISON:  05/01/2021 CT head and CTA head and neck, 11/12/2009 MRI FINDINGS: Brain: Confluent restricted diffusion in the insula, anterior temporal lobe, and inferior  frontal lobe, with additional less severe restricted diffusion in the right caudate and lentiform nucleus (series 2, images 22-31) consistent with known right MCA occlusion. Additional non contiguous areas of restricted diffusion in the right frontal lobe (series 2, image 32 and series 3, image 24), right temporal lobe (series 2, image 31 and series 3, image 17), and right parietal lobe (series 2, image 33 and series 3, image 14). These areas are associated with increased T2 signal, likely cytotoxic edema, with greater T2 hyperintense signal in the caudate and lentiform nucleus, with mild narrowing of the frontal horn of the right lateral ventricle. No definite acute hemorrhage. No mass or midline shift. Encephalomalacia in the right posterior temporal and right parietal lobes, consistent with remote right MCA territory infarct. No hydrocephalus or extra-axial collection. Vascular: Normal proximal flow voids. Poor visualization of more distal right MCA flow voids. Skull and upper cervical spine: Normal marrow signal. Sinuses/Orbits: Status post bilateral lens replacements. Air-fluid level in the left maxillary sinus. Mucosal thickening in the right maxillary sinus, right-greater-than-left frontal sinus, bilateral sphenoid sinuses, and throughout the ethmoid air cells. Other: Fluid throughout the bilateral mastoid air cells. IMPRESSION: Acute infarcts involving the insula, anterior temporal lobe, and inferior frontal lobe, consistent with known recent right MCA occlusion, now status post thrombectomy. Additional areas of restricted diffusion in the caudate and lentiform nucleus appear slightly less hyperintense on diffusion-weighted imaging, possibly slightly less acute infarcts. These areas are all associated with various degrees of edema, causing mild narrowing of the frontal horn of the right lateral ventricle, but without significant mass effect or midline shift. No evidence of hemorrhagic transformation.  Electronically Signed   By: Merilyn Baba M.D.   On: 05/03/2021 17:51        Scheduled Meds:  aspirin EC  81 mg Oral Daily   Chlorhexidine Gluconate Cloth  6 each Topical Daily   donepezil  10 mg Oral QHS   feeding supplement  237 mL Oral BID BM   fluticasone furoate-vilanterol  1 puff Inhalation Daily   Gerhardt's butt cream   Topical BID   levothyroxine  88 mcg Oral QAC breakfast   mouth rinse  15 mL Mouth Rinse BID   montelukast  10 mg Oral QHS   pravastatin  40 mg Oral q1800   pregabalin  50 mg Oral BID   sodium chloride flush  10-40 mL Intracatheter Q12H   topiramate  200 mg Oral BID   umeclidinium bromide  1 puff Inhalation Daily   Continuous Infusions:  sodium chloride Stopped (05/02/21 1516)   sodium chloride      ceFAZolin (ANCEF) IV 2 g (05/05/21 0700)   heparin 1,300 Units/hr (05/04/21 2004)   norepinephrine (LEVOPHED) Adult infusion Stopped (05/04/21 0908)     LOS: 4 days    Time spent: More than 50% of that time was spent in counseling and/or coordination of care.      Shelly Coss, MD [facility] P1/13/2023, 7:55 AM

## 2021-05-05 NOTE — Progress Notes (Signed)
ANTICOAGULATION CONSULT NOTE  Pharmacy Consult for Heparin Indication: atrial fibrillation  Allergies  Allergen Reactions   Ciprofloxacin Hives    Ask patient   Prochlorperazine Edisylate Other (See Comments)    Cant swallow Muscle cramping   Prednisone    Reduced Iso-Alpha Acids Complex    Statins Other (See Comments)    Muscle weakness    Sulfa Antibiotics Swelling   Azithromycin Rash   Latex Rash    Patient Measurements: Height: 5\' 1"  (154.9 cm) Weight: 68.2 kg (150 lb 5.7 oz) IBW/kg (Calculated) : 47.8 Heparin Dosing Weight: 60.7 kg  Vital Signs: Temp: 99.8 F (37.7 C) (01/13 0324) Temp Source: Oral (01/13 0324) BP: 113/55 (01/13 0324) Pulse Rate: 71 (01/13 0324)  Labs: Recent Labs    05/02/21 1140 05/03/21 0538 05/04/21 0409 05/04/21 0529 05/04/21 1818 05/05/21 0528  HGB  --  9.0*  --  8.8*  --  9.2*  HCT  --  29.3*  --  30.2*  --  30.7*  PLT  --  173  --  127*  --  124*  APTT 108* 127*  --   --   --   --   HEPARINUNFRC 0.37 0.34 0.15*  --  <0.10* 0.16*  CREATININE  --  1.36*  --  1.14*  --  1.21*     Estimated Creatinine Clearance: 35.5 mL/min (A) (by C-G formula based on SCr of 1.21 mg/dL (H)).   Assessment: 64 YOF with a history of CKDIII, polypharmacy, T2DM, hypothyroidism, HLD, CAD, AF on Xarelto, S/P CABG, tremor and stroke. Patient is presenting with AMS. Heparin per pharmacy consult placed for possible CVA. Now indicated for AF.  Patient is on Xarelto prior to arrival (LD 1/8 at 1300).   Heparin level down to undetectable on infusion at 1150 units/hr. No issues with line or bleeding reported per RN. Noted plan to switch back to Xarelto as soon as urology has r/o further procedures.  1/13 AM update:  Heparin level low but trending up  Goal of Therapy:  Heparin level 0.3-0.5 units Monitor platelets by anticoagulation protocol: Yes   Plan:  Increase heparin infusion to 1450 units/hr Recheck heparin level in 8 hours F/u ability to  change back to [place], PharmD, [place] Pharmacist Phone: [phone]

## 2021-05-05 NOTE — TOC Transition Note (Addendum)
Transition of Care [facility]) - CM/SW Discharge Note   Patient Details  Name: Jenny Kelly MRN: [id number] Date of Birth: 08-01-1945  [facility] CM/SW Contact:  Kermit Balo, RN Phone Number: 05/05/2021, 3:33 PM   Clinical Narrative:    Patient not able to d/c to CIR today. Hope for weekend d/c.    Final next level of care: IP Rehab Facility Barriers to Discharge: No Barriers Identified   Patient Goals and CMS Choice     Choice offered to / list presented to : Patient  Discharge Placement                       Discharge Plan and Services                                     Social Determinants of Health (SDOH) Interventions     Readmission Risk Interventions No flowsheet data found.

## 2021-05-05 NOTE — Progress Notes (Signed)
STROKE TEAM PROGRESS NOTE   INTERVAL HISTORY No family at bedside.  Patient initially sleeping, but easily arousable, neuro stable, unchanged.  Heparin has switched back to Xarelto, also on aspirin.  Vitals:   05/05/21 0851 05/05/21 1202 05/05/21 1500 05/05/21 1554  BP:  105/72 97/61   Pulse:  78 73   Resp:  19 16   Temp:  98.7 F (37.1 C)  99.9 F (37.7 C)  TempSrc:  Oral  Oral  SpO2: 97% 95% 99%   Weight:      Height:       CBC:  Recent Labs  Lab 05/04/21 0529 05/05/21 0528  WBC 15.0* 12.4*  NEUTROABS 11.2* 7.7  HGB 8.8* 9.2*  HCT 30.2* 30.7*  MCV 91.8 91.1  PLT 127* [id number]*   Basic Metabolic Panel:  Recent Labs  Lab 05/01/21 2322 05/02/21 0910 05/02/21 2055 05/03/21 0538 05/04/21 0529 05/05/21 0528  NA 137   < >  --    < > 144 141  K 4.2   < >  --    < > 3.4* 4.4  CL 109   < >  --    < > 114* 117*  CO2 18*   < >  --    < > 20* 19*  GLUCOSE 142*   < >  --    < > 100* 91  BUN 19   < >  --    < > 13 9  CREATININE 1.83*   < >  --    < > 1.14* 1.21*  CALCIUM 8.0*   < >  --    < > 7.8* 7.8*  MG 1.3*  --  3.5*  --  2.3  --   PHOS 2.9  --   --   --   --   --    < > = values in this interval not displayed.   Lipid Panel:  Recent Labs  Lab 05/03/21 0538  CHOL 148  TRIG 132  HDL 24*  CHOLHDL 6.2  VLDL 26  LDLCALC 98   HgbA1c:  Recent Labs  Lab 05/03/21 0538  HGBA1C 5.9*   Urine Drug Screen: No results for input(s): LABOPIA, COCAINSCRNUR, LABBENZ, AMPHETMU, THCU, LABBARB in the last 168 hours.  Alcohol Level No results for input(s): ETH in the last 168 hours.  IMAGING past 24 hours No results found.  Physical Exam  Constitutional: Appears well-developed and well-nourished.  Cardiovascular: Normal rate and regular rhythm.  Respiratory: Effort normal, non-labored breathing  Neuro: awake alert, orientated x4.  No aphasia, fluent language, mild dysarthria, follows simple commands, able to name and repeat.  Visual fields full, no gaze palsy, mild left  facial droop, left upper extremity mild drift, 3+/5, otherwise no weakness found right upper and bilateral lower extremities.  Sensation symmetrical, right finger-to-nose intact.   ASSESSMENT/PLAN [name] is a 76 y.o. female with history of a.fib (xarelto) Allergy, Anemia, Anxiety, Arthritis, Asthma, Cataract, CHF, Chronic kidney disease, Clotting disorder , COPD, Depression, Diabetes mellitus without complication , GERD  Heart murmur, Hyperlipidemia, Myocardial infarction, Neuromuscular disorder , Seizures , Sleep apnea, Stroke and Thyroid disease. Patient's husband reports Taline had dysuria on Friday, and she took AZO OTC, with resolution of sx on Sunday (1/8). This morning, patient was up at 8 AM, ambulated to the restroom without issue. Husband reports her urine smelled "awful." At 9:30 AM, he was unable to get her to help with getting dressed; she was lethargic and  weak. He did not notice if the weakness was unilateral or generalized. He then called EMS, who took her to [facility]. Around noon while at [place], patient was with nonsensical speech and a L facial droop. Teleneurology evaluated the patient. She was not a candidate for TNK due to anticoagulation. CTA of head and neck revealed an acute right M1 occlusion and severe stenosis of the right M2 more distally. The patient was emergently transported to [facility] for CTP and possible mechanical thrombectomy. Neurology and Neuro IR decided against mechanical thrombectomy given small penumbra.  Patient experienced bradycardia and dopamine was started, she then had a 25 beat run of VT. Dopamine was d/c'd, then she became bradycardic again and atropine was needed. She is currently on norepinephrine and vasopressin for BP and HR control. Cardiology consult placed today. WBC elevated to 45.3, zosyn started and cultures pending.   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 MRI  Acute infarcts involving the insula, anterior temporal lobe, and inferior frontal lobe, consistent with known recent right MCA occlusion. Additional areas of restricted diffusion in the caudate and lentiform nucleus appear slightly less hyperintense on diffusion-weighted imaging, possibly slightly less acute infarcts.  2D Echo EF 60-65% LDL 98 HgbA1c 5.9 VTE prophylaxis - SCDs Xarelto (rivaroxaban) daily prior to admission, now on heparin IV.  Now on home Xarelto. Given severe stenosis of right M1 and M2, added ASA 81 on tope of AC.  Therapy recommendations:  CIR Disposition:  pending  Bradycardia and Hypotension Dopamine d/c'd 1/10 after 25 beat run of VT  Atropine used for bradycardia 1/10 Off pressor now BP goal 120-150 given right M1 and M2 high grade stenosis Avoid low BP  Atrial fibrillation  Home medications: Amiodarone 50mg  daily and Xarelto 20mg  Currently bradycardic, now improved Cardiology on board Was on heparin IV, now switched back to Xarelto  Ureteral Stone with UTI 1/9 found to have 7 mm left ureteral stone.   Cystoscopy with left ureteral stent placement and left retrograde pyelogram. No further procedure planned so far  Hyperlipidemia Home meds: none LDL 98, goal < 70 Statin intolerance listed for muscle weakness On low intensity pravastatin 40 Continue statin at discharge  Other Stroke Risk Factors Advanced Age >/= 70  Former Cigarette smoker,advised to stop smoking Hx stroke/TIA 2009- Hemorrhagic stroke where her only preceding sx was severe HA, no residual sx. Coronary artery disease 2002- MI with stent placement c/b pulmonary emboli 2015- triple bypass cardiac  surgery. Migraines Obstructive sleep apnea, on CPAP at home Congestive heart failure  Other Active Problems Hyperkalemia 5.6-> 3.9->3.0 - supplement Thrombocytopenia plate 224-> [id number] 1.7 Anemia Hb 11.1-10.0-9.0-8.8 Leukocytosis WBC 45.3->31.2->15.0->12.4 On zosyn Pulmonary fibrosis- [facility]  Upper lobe predominant emphysema  Residual from COVID in [facility] day # 4  Neurology will sign off. Please call with questions. Pt will follow up with stroke clinic NP at [facility] in about 4 weeks. Thanks for the consult.   Rosalin Hawking, MD PhD Stroke Neurology 05/05/2021 6:13 PM    To contact Stroke Continuity provider, please refer to [email or web address] After hours, contact General Neurology

## 2021-05-05 NOTE — Discharge Summary (Addendum)
Physician Discharge Summary  Jenny Kelly [id number] DOB: 11-15-1945 DOA: 05/01/2021  PCP: Townsend Roger, MD  Admit date: 05/01/2021 Discharge date: 05/07/2021  Admitted From: Home Disposition:  SNF  Discharge Condition:Stable CODE STATUS:Partial Diet recommendation: Dysphagia 2   Brief/Interim Summary:  Patient is a 76 year old female with history of A. Fib, arthritis, asthma, cataract, congestive heart failure, CKD, COPD, diabetes type 2, hyperlipidemia, coronary disease, seizure disorder who presented to [facility] with acute onset of left-sided weakness.  Code stroke was called.  CTA showed acute M1 and M2 occlusion and her UA was consistent with UTI.  She was transferred to [facility] for further evaluation and management.  She received multiple fluid boluses for the concern of UTI, sepsis.  Also found to have a 7 mm left ureteral stone status post cystoscopy with left ureteral stent placement.  She developed respiratory distress and was placed on BiPAP and was also thought to have aspiration event.  CT done here showed right MCA infract, evaluated by neurology.  Hospital course was remarkable for hypotension requiring pressors.  Now hemodynamically stable.  Patient transferred to our service on 1/13.  PT/OT recommended CIR on discharge.  Medically stable for discharge to CIR.  Following problems were addressed during her hospitalization:    Acute ischemic stroke: Imagings suggestive of right MCA infarct likely due to right M1, proximal M2 high-grade stenosis/occlusion.  MRI showed acute infarct involving the insula, anterior temporal lobe, inferior frontal lobe consistent with right MCA occlusion.  2D echo showed EF of 60 to 65%, LDL of 98, hemoglobin 5.9. Takes Xarelto at home, currently on heparin drip,now switched  to [place] .  Neurology also recommended adding aspirin. PT/OT recommended CIR on discharge.   Septic shock/pyelonephritis/left renal calculus: Status post left  ureteral stenting.  Found to have 7 mm left ureteral stone.  Blood pressure was low, requiring pressures in ICU.  Off pressors now.  Continue current antibiotics, changed to  Keflex, continue for 7 days.. One set of Blood culture showed E. coli. Discussed with urology, she has been recommended outpatient follow-up.   Paroxysmal A. fib: On amiodarone, Xarelto at home.  Cardiology were following.  EP was also consulted for wide-complex tachycardia.  Atropine was used for bradycardia and 1/10.  Currently she is in normal sinus rhythm.  Rate well controlled.  Amiodarone will be discontinued.   Acute respiratory failure: Required BiPAP on presentation.  Currently on 1 to 2 L of oxygen per minute.  Not on oxygen at home.  Continue to wean the oxygen at CIR   Hyperlipidemia: LDL of 98.  On pravastatin 40 mg daily.   AKI: Baseline creatinine normal.  Kidney function currently close to baseline.   History of coronary artery disease: Status post bypass surgery, stent placement.  No ischemic symptoms right now.   History of OSA: On CPAP   History of diastolic congestive heart failure: Currently euvolemic.   Normocytic anemia: Currently hemoglobin stable.  Continue to monitor   Leukocytosis: Likely secondary to sepsis.  Improving.  Has mild thrombocytopenia   History of pulmonary fibrosis: Follows with pulmonology as an outpatient.  Had [place] in 2021.  Dysphagia: Speech therapy was following.  Currently on dysphagia 2  diet.  Continue to upgrade the diet at CIR by following with speech therapy      Discharge Diagnoses:  Principal Problem:   Stroke [facility]) Active Problems:   PAF (paroxysmal atrial fibrillation) (HCC)   Bradycardia   Wide-complex tachycardia  Discharge Instructions  Discharge Instructions     Ambulatory referral to Neurology   Complete by: As directed    Follow up with stroke clinic NP (Jessica Vanschaick or Cecille Rubin, if both not available, consider Zachery Dauer, or Ahern) at [facility] in about 4 weeks. Thanks.   Diet general   Complete by: As directed    Dysphagia 2 diet   Discharge instructions   Complete by: As directed    1)Please take prescribed medications as instructed 2)Follow up with speech therapy at [facility] 2)Follow up with urology as an outpatient   Increase activity slowly   Complete by: As directed    No wound care   Complete by: As directed       Allergies as of 05/07/2021       Reactions   Ciprofloxacin Hives   Ask patient   Prochlorperazine Edisylate Other (See Comments)   Cant swallow Muscle cramping   Prednisone    Reduced Iso-alpha Acids Complex    Statins Other (See Comments)   Muscle weakness   Sulfa Antibiotics Swelling   Azithromycin Rash   Latex Rash        Medication List     STOP taking these medications    amiodarone 100 MG tablet Commonly known as: PACERONE       TAKE these medications    albuterol 108 (90 Base) MCG/ACT inhaler Commonly known as: Proventil HFA Inhale two puffs every 4-6 hours if needed for coughing, wheezing, or shortness of breath.   aspirin 81 MG EC tablet Take 1 tablet (81 mg total) by mouth daily. Swallow whole.   azelastine 0.1 % nasal spray Commonly known as: ASTELIN Use one spray in each nostril twice daily   B-12 PO Take by mouth.   budesonide 0.5 MG/2ML nebulizer solution Commonly known as: PULMICORT Take 2 mLs (0.5 mg total) by nebulization in the morning and at bedtime.   cephALEXin 500 MG capsule Commonly known as: KEFLEX Take 1 capsule (500 mg total) by mouth every 8 (eight) hours for 7 days.   donepezil 10 MG tablet Commonly known as: ARICEPT Take 10 mg by mouth at bedtime.   ergocalciferol 1.25 MG (50000 UT) capsule Commonly known as: VITAMIN D2 Take 50,000 Units by mouth See admin instructions. Takes twice a week on Wednesday and Saturday   famotidine 40 MG tablet Commonly known as: PEPCID Take one tablet once daily   fluticasone 50  MCG/ACT nasal spray Commonly known as: FLONASE SPRAY 1 SPRAY INTO BOTH NOSTRILS DAILY.   ipratropium 0.03 % nasal spray Commonly known as: ATROVENT SPRAY 2 SPRAYS INTO BOTH NOSTRILS EVERY 12 HOURS.   levothyroxine 88 MCG tablet Commonly known as: SYNTHROID Take 88 mcg by mouth daily before breakfast.   montelukast 10 MG tablet Commonly known as: SINGULAIR Take 10 mg by mouth at bedtime.   MUCINEX PO Take 1 tablet by mouth as needed (cough, congestion).   omeprazole 40 MG capsule Commonly known as: PRILOSEC Take 40 mg by mouth daily.   pravastatin 40 MG tablet Commonly known as: PRAVACHOL Take 1 tablet (40 mg total) by mouth daily at 6 PM.   pregabalin 50 MG capsule Commonly known as: LYRICA Take 50 mg by mouth 2 (two) times daily.   rivaroxaban 20 MG Tabs tablet Commonly known as: XARELTO Take 20 mg by mouth every evening.   topiramate 200 MG tablet Commonly known as: TOPAMAX Take 200 mg by mouth 2 (two) times daily.   Trelegy Ellipta  100-62.5-25 MCG/ACT Aepb Generic drug: Fluticasone-Umeclidin-Vilant Inhale 1 puff into the lungs daily.   URISTAT PO Take 1 tablet by mouth as needed (UTI).        Follow-up Information     Ceasar Mons, MD. Schedule an appointment as soon as possible for a visit in 2 week(s).   Specialty: Urology Contact information: [place] [id number] [phone]         [facility]. Schedule an appointment as soon as possible for a visit in 1 month(s).   Specialty: Neurology Why: stroke clinic Contact information: [place] [phone]               Allergies  Allergen Reactions   Ciprofloxacin Hives    Ask patient   Prochlorperazine Edisylate Other (See Comments)    Cant swallow Muscle cramping   Prednisone    Reduced Iso-Alpha Acids Complex    Statins Other (See Comments)    Muscle weakness    Sulfa  Antibiotics Swelling   Azithromycin Rash   Latex Rash    Consultations: Cardiology, PCCM, EP   Procedures/Studies: CT ABDOMEN PELVIS WO CONTRAST  Result Date: 05/04/2021 CLINICAL DATA:  Suprapubic pain. EXAM: CT ABDOMEN AND PELVIS WITHOUT CONTRAST TECHNIQUE: Multidetector CT imaging of the abdomen and pelvis was performed following the standard protocol without IV contrast. COMPARISON:  CT abdomen and pelvis dated May 01, 2021. FINDINGS: Lower chest: New trace bilateral pleural effusions with bilateral posterior lower lobe subsegmental atelectasis. Increased peribronchial thickening in both lower lobes. Unchanged mild chronic interstitial lung disease. Hepatobiliary: No focal liver abnormality is seen. Status post cholecystectomy. No biliary dilatation. Pancreas: Unchanged pancreatic calcifications. No ductal dilatation or surrounding inflammatory changes. Spleen: Normal in size without focal abnormality. Multiple calcified granulomas again noted. Adrenals/Urinary Tract: Adrenal glands and right kidney are unremarkable. Multiple left renal calculi measuring up to 7 mm are unchanged. New left double-J ureteral stent with resolved left hydronephrosis. Unchanged 7 mm calculus in the left proximal ureter. Bladder is unremarkable. Stomach/Bowel: Stomach is within normal limits. Diminutive or absent appendix. No evidence of bowel wall thickening, distention, or inflammatory changes. Vascular/Lymphatic: Aortic atherosclerosis. No enlarged abdominal or pelvic lymph nodes. Reproductive: Status post hysterectomy. No adnexal masses. Other: No abdominal wall hernia or abnormality. No abdominopelvic ascites. No pneumoperitoneum. Musculoskeletal: No acute or significant osseous findings. IMPRESSION: 1. New left double-J ureteral stent with resolved left hydronephrosis. Unchanged 7 mm calculus in the left proximal ureter. 2. Unchanged additional nonobstructive left nephrolithiasis. 3. New trace bilateral pleural  effusions with increased peribronchial thickening in both lower lobes. 4. Aortic Atherosclerosis (ICD10-I70.0). Electronically Signed   By: Titus Dubin M.D.   On: 05/04/2021 17:20   MR BRAIN WO CONTRAST  Result Date: 05/03/2021 CLINICAL DATA:  Stroke, follow-up EXAM: MRI HEAD WITHOUT CONTRAST TECHNIQUE: Multiplanar, multiecho pulse sequences of the brain and surrounding structures were obtained without intravenous contrast. COMPARISON:  05/01/2021 CT head and CTA head and neck, 11/12/2009 MRI FINDINGS: Brain: Confluent restricted diffusion in the insula, anterior temporal lobe, and inferior frontal lobe, with additional less severe restricted diffusion in the right caudate and lentiform nucleus (series 2, images 22-31) consistent with known right MCA occlusion. Additional non contiguous areas of restricted diffusion in the right frontal lobe (series 2, image 32 and series 3, image 24), right temporal lobe (series 2, image 31 and series 3, image 17), and right parietal lobe (series 2, image 33 and series  3, image 14). These areas are associated with increased T2 signal, likely cytotoxic edema, with greater T2 hyperintense signal in the caudate and lentiform nucleus, with mild narrowing of the frontal horn of the right lateral ventricle. No definite acute hemorrhage. No mass or midline shift. Encephalomalacia in the right posterior temporal and right parietal lobes, consistent with remote right MCA territory infarct. No hydrocephalus or extra-axial collection. Vascular: Normal proximal flow voids. Poor visualization of more distal right MCA flow voids. Skull and upper cervical spine: Normal marrow signal. Sinuses/Orbits: Status post bilateral lens replacements. Air-fluid level in the left maxillary sinus. Mucosal thickening in the right maxillary sinus, right-greater-than-left frontal sinus, bilateral sphenoid sinuses, and throughout the ethmoid air cells. Other: Fluid throughout the bilateral mastoid air  cells. IMPRESSION: Acute infarcts involving the insula, anterior temporal lobe, and inferior frontal lobe, consistent with known recent right MCA occlusion, now status post thrombectomy. Additional areas of restricted diffusion in the caudate and lentiform nucleus appear slightly less hyperintense on diffusion-weighted imaging, possibly slightly less acute infarcts. These areas are all associated with various degrees of edema, causing mild narrowing of the frontal horn of the right lateral ventricle, but without significant mass effect or midline shift. No evidence of hemorrhagic transformation. Electronically Signed   By: Wiliam Ke M.D.   On: 05/03/2021 17:51   CT CEREBRAL PERFUSION W CONTRAST  Result Date: 05/01/2021 EXAM: CT PERFUSION BRAIN TECHNIQUE: Multiphase CT imaging of the brain was performed following IV bolus contrast injection. Subsequent parametric perfusion maps were calculated using RAPID software. CONTRAST:  27mL OMNIPAQUE IOHEXOL 350 MG/ML SOLN COMPARISON:  None. FINDINGS: CT Brain Perfusion Findings: CBF (<30%) Volume: 83mL Perfusion (Tmax>6.0s) volume: 49mL Mismatch Volume: 78mL ASPECTS on noncontrast CT Head: 10 at 1:43 p.m. today. Infarct Core: 13 mL Infarction Location:Right MCA territory, in the right posterior temporal, posterior frontal, and anterior parietal lobes. IMPRESSION: 13 mL infarct core, involving the right MCA territory in the right posterior temporal, posterior frontal, and anterior parietal lobes. Electronically Signed   By: Wiliam Ke M.D.   On: 05/01/2021 16:56   DG Chest Port 1 View  Result Date: 05/02/2021 CLINICAL DATA:  PICC line placement. EXAM: PORTABLE CHEST 1 VIEW COMPARISON:  Same day. FINDINGS: Stable cardiomediastinal silhouette. Interval placement of right-sided PICC line with distal tip in expected position of the SVC. IMPRESSION: Interval placement of right-sided PICC line with distal tip in expected position of the SVC. Electronically Signed   By:  Lupita Raider M.D.   On: 05/02/2021 16:26   DG Chest Port 1 View  Result Date: 05/02/2021 CLINICAL DATA:  Respiratory failure, pulmonary edema EXAM: PORTABLE CHEST 1 VIEW COMPARISON:  Chest radiograph 05/01/2021 FINDINGS: Median sternotomy wires and mediastinal surgical clips are stable. The cardiomediastinal silhouette is stable. Coarse reticular markings throughout both lungs are again seen, unchanged. There is no new or worsening focal airspace disease. There is no pleural effusion or pneumothorax. The bones are stable. IMPRESSION: Stable chest with no radiographic evidence of acute cardiopulmonary process. Electronically Signed   By: Lesia Hausen M.D.   On: 05/02/2021 08:15   DG Chest Port 1 View  Result Date: 05/01/2021 CLINICAL DATA:  Shortness of breath EXAM: PORTABLE CHEST 1 VIEW COMPARISON:  06/20/2020 chest CT, radiograph 05/01/2021, 03/19/2021, 03/24/2020 FINDINGS: Diffuse bilateral reticular opacity consistent with chronic interstitial lung disease. No confluent acute airspace disease, pleural effusion or pneumothorax. Stable cardiomediastinal silhouette. Post sternotomy changes. Aortic atherosclerosis. IMPRESSION: Chronic interstitial lung disease without acute superimposed airspace disease  Electronically Signed   By: Donavan Foil M.D.   On: 05/01/2021 17:36   DG C-Arm 1-60 Min  Result Date: 05/01/2021 CLINICAL DATA:  Cystoscopy with retrograde pyelogram EXAM: DG C-ARM 1-60 MIN FLUOROSCOPY TIME:  Fluoroscopy Time:  14 seconds Radiation Exposure Index (if provided by the fluoroscopic device): 3.49 mGy Number of Acquired Spot Images: 0 COMPARISON:  None. FINDINGS: Multiple images are submitted from a left retrograde pyelogram. No visible filling defect. Final images demonstrate placement of left ureteral stent. IMPRESSION: As above. Electronically Signed   By: Rolm Baptise M.D.   On: 05/01/2021 21:02   ECHOCARDIOGRAM COMPLETE  Result Date: 05/02/2021    ECHOCARDIOGRAM REPORT   Patient  Name:   Jenny Kelly Date of Exam: 05/02/2021 Medical Rec #:  [id number]       Height:       61.0 in Accession #:    [id number]      Weight:       138.9 lb Date of Birth:  December 08, 1945        BSA:          1.618 m Patient Age:    40 years        BP:           104/56 mmHg Patient Gender: F               HR:           46 bpm. Exam Location:  Inpatient Procedure: 2D Echo, Cardiac Doppler and Color Doppler Indications:    Stroke  History:        Patient has no prior history of Echocardiogram examinations.                 Arrythmias:Bradycardia; Signs/Symptoms:Shortness of Breath.  Sonographer:    Merrie Roof RDCS Referring Phys: [id number] [place]  1. Left ventricular ejection fraction, by estimation, is 60 to 65%. The left ventricle has normal function. The left ventricle has no regional wall motion abnormalities. Left ventricular diastolic parameters are consistent with Grade II diastolic dysfunction (pseudonormalization). Elevated left atrial pressure.  2. Right ventricular systolic function is normal. The right ventricular size is normal. There is normal pulmonary artery systolic pressure.  3. The mitral valve is normal in structure. No evidence of mitral valve regurgitation. No evidence of mitral stenosis.  4. The aortic valve is normal in structure. Aortic valve regurgitation is trivial. No aortic stenosis is present.  5. The inferior vena cava is normal in size with <50% respiratory variability, suggesting right atrial pressure of 8 mmHg. FINDINGS  Left Ventricle: Mitral inflow "U" wave is seen. Left ventricular ejection fraction, by estimation, is 60 to 65%. The left ventricle has normal function. The left ventricle has no regional wall motion abnormalities. The left ventricular internal cavity size was normal in size. There is no left ventricular hypertrophy. Left ventricular diastolic parameters are consistent with Grade II diastolic dysfunction (pseudonormalization). Elevated left atrial pressure.  Right Ventricle: The right ventricular size is normal. No increase in right ventricular wall thickness. Right ventricular systolic function is normal. There is normal pulmonary artery systolic pressure. The tricuspid regurgitant velocity is 2.50 m/s, and  with an assumed right atrial pressure of 8 mmHg, the estimated right ventricular systolic pressure is 123456 mmHg. Left Atrium: Left atrial size was normal in size. Right Atrium: Right atrial size was normal in size. Pericardium: There is no evidence of pericardial effusion. Mitral Valve: The mitral valve is normal in  structure. No evidence of mitral valve regurgitation. No evidence of mitral valve stenosis. Tricuspid Valve: The tricuspid valve is normal in structure. Tricuspid valve regurgitation is trivial. No evidence of tricuspid stenosis. Aortic Valve: The aortic valve is normal in structure. Aortic valve regurgitation is trivial. No aortic stenosis is present. Aortic valve mean gradient measures 2.0 mmHg. Aortic valve peak gradient measures 4.4 mmHg. Aortic valve area, by VTI measures 1.72 cm. Pulmonic Valve: The pulmonic valve was normal in structure. Pulmonic valve regurgitation is not visualized. No evidence of pulmonic stenosis. Aorta: The aortic root is normal in size and structure. Venous: The inferior vena cava is normal in size with less than 50% respiratory variability, suggesting right atrial pressure of 8 mmHg. IAS/Shunts: No atrial level shunt detected by color flow Doppler.  LEFT VENTRICLE PLAX 2D LVIDd:         5.60 cm   Diastology LVIDs:         3.60 cm   LV e' medial:    5.00 cm/s LV PW:         0.90 cm   LV E/e' medial:  19.0 LV IVS:        0.80 cm   LV e' lateral:   7.51 cm/s LVOT diam:     1.80 cm   LV E/e' lateral: 12.7 LV SV:         45 LV SV Index:   28 LVOT Area:     2.54 cm  RIGHT VENTRICLE            IVC RV Basal diam:  3.30 cm    IVC diam: 1.90 cm RV S prime:     8.06 cm/s TAPSE (M-mode): 1.6 cm LEFT ATRIUM             Index         RIGHT ATRIUM           Index LA diam:        3.90 cm 2.41 cm/m   RA Area:     22.30 cm LA Vol (A2C):   50.0 ml 30.91 ml/m  RA Volume:   74.70 ml  46.18 ml/m LA Vol (A4C):   44.3 ml 27.39 ml/m LA Biplane Vol: 48.6 ml 30.04 ml/m  AORTIC VALVE AV Area (Vmax):    1.74 cm AV Area (Vmean):   1.67 cm AV Area (VTI):     1.72 cm AV Vmax:           105.00 cm/s AV Vmean:          69.200 cm/s AV VTI:            0.264 m AV Peak Grad:      4.4 mmHg AV Mean Grad:      2.0 mmHg LVOT Vmax:         71.70 cm/s LVOT Vmean:        45.400 cm/s LVOT VTI:          0.178 m LVOT/AV VTI ratio: 0.67  AORTA Ao Root diam: 3.40 cm Ao Asc diam:  3.30 cm MITRAL VALVE               TRICUSPID VALVE MV Area (PHT): 5.66 cm    TR Peak grad:   25.0 mmHg MV Decel Time: 134 msec    TR Vmax:        250.00 cm/s MV E velocity: 95.20 cm/s MV A velocity: 68.40 cm/s  SHUNTS MV E/A ratio:  1.39  Systemic VTI:  0.18 m                            Systemic Diam: 1.80 cm Sanda Klein MD Electronically signed by Sanda Klein MD Signature Date/Time: 05/02/2021/12:46:12 PM    Final    [place] EKG SITE RITE  Result Date: 05/02/2021 If Site Rite image not attached, placement could not be confirmed due to current cardiac rhythm.     Subjective:  Patient seen and examined at the bedside this morning.  Hemodynamically stable.  Husband at bedside.  Denies any complaints.  Medically stable for discharge. Discharge Exam: Vitals:   05/07/21 0743 05/07/21 0849  BP: (!) 116/54   Pulse: 70   Resp: 19   Temp: 98.5 F (36.9 C)   SpO2: 100% 93%   Vitals:   05/07/21 0351 05/07/21 0500 05/07/21 0743 05/07/21 0849  BP: 106/65  (!) 116/54   Pulse: 65  70   Resp: 15  19   Temp: 98.1 F (36.7 C)  98.5 F (36.9 C)   TempSrc: Oral  Oral   SpO2: 97%  100% 93%  Weight:  67 kg    Height:        General: Pt is alert, awake, not in acute distress, deconditioned, weak, Cardiovascular: RRR, S1/S2 +, no rubs, no gallops Respiratory: CTA bilaterally,  no wheezing, no rhonchi Abdominal: Soft, NT, ND, bowel sounds + Extremities: no edema, no cyanosis    The results of significant diagnostics from this hospitalization (including imaging, microbiology, ancillary and laboratory) are listed below for reference.     Microbiology: Recent Results (from the past 240 hour(s))  Resp Panel by RT-PCR (Flu A&B, Covid)     Status: None   Collection Time: 05/01/21  4:53 PM   Specimen: Nasopharyngeal(NP) swabs in vial transport medium  Result Value Ref Range Status   SARS Coronavirus 2 by RT PCR NEGATIVE NEGATIVE Final    Comment: (NOTE) SARS-CoV-2 target nucleic acids are NOT DETECTED.  The SARS-CoV-2 RNA is generally detectable in upper respiratory specimens during the acute phase of infection. The lowest concentration of SARS-CoV-2 viral copies this assay can detect is 138 copies/mL. A negative result does not preclude SARS-Cov-2 infection and should not be used as the sole basis for treatment or other patient management decisions. A negative result may occur with  improper specimen collection/handling, submission of specimen other than nasopharyngeal swab, presence of viral mutation(s) within the areas targeted by this assay, and inadequate number of viral copies(<138 copies/mL). A negative result must be combined with clinical observations, patient history, and epidemiological information. The expected result is Negative.  Fact Sheet for Patients:  EntrepreneurPulse.com.au  Fact Sheet for Healthcare Providers:  IncredibleEmployment.be  This test is no t yet approved or cleared by the [place] FDA and  has been authorized for detection and/or diagnosis of SARS-CoV-2 by FDA under an Emergency Use Authorization (EUA). This EUA will remain  in effect (meaning this test can be used) for the duration of the COVID-19 declaration under Section 564(b)(1) of the Act, 21 U.S.C.section 360bbb-3(b)(1),  unless the authorization is terminated  or revoked sooner.       Influenza A by PCR NEGATIVE NEGATIVE Final   Influenza B by PCR NEGATIVE NEGATIVE Final    Comment: (NOTE) The Xpert Xpress SARS-CoV-2/FLU/RSV plus assay is intended as an aid in the diagnosis of influenza from Nasopharyngeal swab specimens and should not be used as a  sole basis for treatment. Nasal washings and aspirates are unacceptable for Xpert Xpress SARS-CoV-2/FLU/RSV testing.  Fact Sheet for Patients: EntrepreneurPulse.com.au  Fact Sheet for Healthcare Providers: IncredibleEmployment.be  This test is not yet approved or cleared by the [place] FDA and has been authorized for detection and/or diagnosis of SARS-CoV-2 by FDA under an Emergency Use Authorization (EUA). This EUA will remain in effect (meaning this test can be used) for the duration of the COVID-19 declaration under Section 564(b)(1) of the Act, 21 U.S.C. section 360bbb-3(b)(1), unless the authorization is terminated or revoked.  Performed at [facility], [place] [address], [place], [place] [address]   Urine Culture     Status: Abnormal   Collection Time: 05/01/21  6:44 PM   Specimen: Urine, Catheterized  Result Value Ref Range Status   Specimen Description URINE, CATHETERIZED  Final   Special Requests NONE  Final   Culture (A)  Final    <10,000 COLONIES/mL INSIGNIFICANT GROWTH Performed at [facility], [place] [address] [place], [place] [address]    Report Status 05/02/2021 FINAL  Final  Blood culture (routine x 2)     Status: None (Preliminary result)   Collection Time: 05/01/21 11:00 PM   Specimen: BLOOD  Result Value Ref Range Status   Specimen Description BLOOD LEFT ANTECUBITAL  Final   Special Requests IN PEDIATRIC BOTTLE Blood Culture adequate volume  Final   Culture   Final    NO GROWTH 4 DAYS Performed at [facility], [place] [address], [place], [place] [address]    Report  Status PENDING  Incomplete  Blood culture (routine x 2)     Status: Abnormal   Collection Time: 05/01/21 11:22 PM   Specimen: BLOOD  Result Value Ref Range Status   Specimen Description BLOOD RIGHT ARM  Final   Special Requests IN PEDIATRIC BOTTLE Blood Culture adequate volume  Final   Culture  Setup Time   Final    GRAM NEGATIVE RODS IN PEDIATRIC BOTTLE CRITICAL RESULT CALLED TO, READ BACK BY AND VERIFIED WITH: Performed at [facility], [place] [address], [place], [place] [address]    Culture ESCHERICHIA COLI (A)  Final   Report Status 05/04/2021 FINAL  Final   Organism ID, Bacteria ESCHERICHIA COLI  Final      Susceptibility   Escherichia coli - MIC*    AMPICILLIN >=32 RESISTANT Resistant     CEFAZOLIN <=4 SENSITIVE Sensitive     CEFEPIME <=0.12 SENSITIVE Sensitive     CEFTAZIDIME <=1 SENSITIVE Sensitive     CEFTRIAXONE <=0.25 SENSITIVE Sensitive     CIPROFLOXACIN 1 RESISTANT Resistant     GENTAMICIN <=1 SENSITIVE Sensitive     IMIPENEM <=0.25 SENSITIVE Sensitive     TRIMETH/SULFA <=20 SENSITIVE Sensitive     AMPICILLIN/SULBACTAM 16 INTERMEDIATE Intermediate     PIP/TAZO <=4 SENSITIVE Sensitive     * ESCHERICHIA COLI  Blood Culture ID Panel (Reflexed)     Status: Abnormal   Collection Time: 05/01/21 11:22 PM  Result Value Ref Range Status   Enterococcus faecalis NOT DETECTED NOT DETECTED Final   Enterococcus Faecium NOT DETECTED NOT DETECTED Final   Listeria monocytogenes NOT DETECTED NOT DETECTED Final   Staphylococcus species NOT DETECTED NOT DETECTED Final   Staphylococcus aureus (BCID) NOT DETECTED NOT DETECTED Final   Staphylococcus epidermidis NOT DETECTED NOT DETECTED Final   Staphylococcus lugdunensis NOT DETECTED NOT DETECTED Final   Streptococcus species NOT DETECTED NOT DETECTED Final  Streptococcus agalactiae NOT DETECTED NOT DETECTED Final   Streptococcus pneumoniae NOT DETECTED NOT DETECTED Final   Streptococcus pyogenes NOT DETECTED NOT DETECTED Final    A.calcoaceticus-baumannii NOT DETECTED NOT DETECTED Final   Bacteroides fragilis NOT DETECTED NOT DETECTED Final   Enterobacterales DETECTED (A) NOT DETECTED Final    Comment: Enterobacterales represent a large order of gram negative bacteria, not a single organism. CRITICAL RESULT CALLED TO, READ BACK BY AND VERIFIED WITH: PHARMD CAREN AMEND 05/02/21 @1736  BY JW    Enterobacter cloacae complex NOT DETECTED NOT DETECTED Final   Escherichia coli DETECTED (A) NOT DETECTED Final    Comment: CRITICAL RESULT CALLED TO, READ BACK BY AND VERIFIED WITH: PHARMD CAREN AMEND 05/02/21 @1736  BY JW    Klebsiella aerogenes NOT DETECTED NOT DETECTED Final   Klebsiella oxytoca NOT DETECTED NOT DETECTED Final   Klebsiella pneumoniae NOT DETECTED NOT DETECTED Final   Proteus species NOT DETECTED NOT DETECTED Final   Salmonella species NOT DETECTED NOT DETECTED Final   Serratia marcescens NOT DETECTED NOT DETECTED Final   Haemophilus influenzae NOT DETECTED NOT DETECTED Final   Neisseria meningitidis NOT DETECTED NOT DETECTED Final   Pseudomonas aeruginosa NOT DETECTED NOT DETECTED Final   Stenotrophomonas maltophilia NOT DETECTED NOT DETECTED Final   Candida albicans NOT DETECTED NOT DETECTED Final   Candida auris NOT DETECTED NOT DETECTED Final   Candida glabrata NOT DETECTED NOT DETECTED Final   Candida krusei NOT DETECTED NOT DETECTED Final   Candida parapsilosis NOT DETECTED NOT DETECTED Final   Candida tropicalis NOT DETECTED NOT DETECTED Final   Cryptococcus neoformans/gattii NOT DETECTED NOT DETECTED Final   CTX-M ESBL NOT DETECTED NOT DETECTED Final   Carbapenem resistance IMP NOT DETECTED NOT DETECTED Final   Carbapenem resistance KPC NOT DETECTED NOT DETECTED Final   Carbapenem resistance NDM NOT DETECTED NOT DETECTED Final   Carbapenem resist OXA 48 LIKE NOT DETECTED NOT DETECTED Final   Carbapenem resistance VIM NOT DETECTED NOT DETECTED Final    Comment: Performed at [facility], [address], [place], [place] [address]  MRSA Next Gen by PCR, Nasal     Status: None   Collection Time: 05/02/21 12:39 AM   Specimen: Nasal Mucosa; Nasal Swab  Result Value Ref Range Status   MRSA by PCR Next Gen NOT DETECTED NOT DETECTED Final    Comment: (NOTE) The GeneXpert MRSA Assay (FDA approved for NASAL specimens only), is one component of a comprehensive MRSA colonization surveillance program. It is not intended to diagnose MRSA infection nor to guide or monitor treatment for MRSA infections. Test performance is not FDA approved in patients less than 62 years old. Performed at [facility], [place] [address], [place], [place] [address]      Labs: BNP (last 3 results) Recent Labs    05/01/21 1558  BNP [id number]*   Basic Metabolic Panel: Recent Labs  Lab 05/01/21 2322 05/02/21 0910 05/02/21 2055 05/03/21 0538 05/04/21 0529 05/05/21 0528 05/06/21 0300  NA 137 137  --  134* 144 141 139  K 4.2 3.9  --  3.0* 3.4* 4.4 3.8  CL 109 111  --  109 114* 117* 113*  CO2 18* 19*  --  19* 20* 19* 22  GLUCOSE 142* 185*  --  132* 100* 91 96  BUN 19 20  --  19 13 9 8   CREATININE 1.83* 1.48*  --  1.36* 1.14* 1.21* 1.11*  CALCIUM 8.0* 7.3*  --  7.2*  7.8* 7.8* 7.9*  MG 1.3*  --  3.5*  --  2.3  --   --   PHOS 2.9  --   --   --   --   --   --    Liver Function Tests: Recent Labs  Lab 05/01/21 1558 05/02/21 0910  AST 60* 22  ALT 9 9  ALKPHOS 74 47  BILITOT 1.7* 0.5  PROT 5.7* 5.1*  ALBUMIN 2.2* 1.9*   No results for input(s): LIPASE, AMYLASE in the last 168 hours. No results for input(s): AMMONIA in the last 168 hours. CBC: Recent Labs  Lab 05/02/21 0910 05/03/21 0538 05/04/21 0529 05/05/21 0528 05/06/21 0300  WBC 45.3* 31.2* 15.0* 12.4* 10.2  NEUTROABS 39.5* 29.0* 11.2* 7.7 7.0  HGB 10.0* 9.0* 8.8* 9.2* 8.9*  HCT 32.2* 29.3* 30.2* 30.7* 29.9*  MCV 89.2 89.3 91.8 91.1 90.1  PLT 224 173 127* 124* 144*   Cardiac Enzymes: No results for input(s): CKTOTAL,  CKMB, CKMBINDEX, TROPONINI in the last 168 hours. BNP: Invalid input(s): POCBNP CBG: Recent Labs  Lab 05/01/21 2115  GLUCAP 124*   D-Dimer No results for input(s): DDIMER in the last 72 hours. Hgb A1c No results for input(s): HGBA1C in the last 72 hours.  Lipid Profile No results for input(s): CHOL, HDL, LDLCALC, TRIG, CHOLHDL, LDLDIRECT in the last 72 hours.  Thyroid function studies No results for input(s): TSH, T4TOTAL, T3FREE, THYROIDAB in the last 72 hours.  Invalid input(s): FREET3 Anemia work up No results for input(s): VITAMINB12, FOLATE, FERRITIN, TIBC, IRON, RETICCTPCT in the last 72 hours. Urinalysis    Component Value Date/Time   COLORURINE YELLOW 05/01/2021 1844   APPEARANCEUR CLEAR 05/01/2021 1844   LABSPEC 1.010 05/01/2021 1844   PHURINE 6.0 05/01/2021 1844   GLUCOSEU NEGATIVE 05/01/2021 1844   HGBUR LARGE (A) 05/01/2021 1844   BILIRUBINUR NEGATIVE 05/01/2021 1844   KETONESUR NEGATIVE 05/01/2021 1844   PROTEINUR NEGATIVE 05/01/2021 1844   NITRITE POSITIVE (A) 05/01/2021 1844   LEUKOCYTESUR MODERATE (A) 05/01/2021 1844   Sepsis Labs Invalid input(s): PROCALCITONIN,  WBC,  LACTICIDVEN Microbiology Recent Results (from the past 240 hour(s))  Resp Panel by RT-PCR (Flu A&B, Covid)     Status: None   Collection Time: 05/01/21  4:53 PM   Specimen: Nasopharyngeal(NP) swabs in vial transport medium  Result Value Ref Range Status   SARS Coronavirus 2 by RT PCR NEGATIVE NEGATIVE Final    Comment: (NOTE) SARS-CoV-2 target nucleic acids are NOT DETECTED.  The SARS-CoV-2 RNA is generally detectable in upper respiratory specimens during the acute phase of infection. The lowest concentration of SARS-CoV-2 viral copies this assay can detect is 138 copies/mL. A negative result does not preclude SARS-Cov-2 infection and should not be used as the sole basis for treatment or other patient management decisions. A negative result may occur with  improper specimen  collection/handling, submission of specimen other than nasopharyngeal swab, presence of viral mutation(s) within the areas targeted by this assay, and inadequate number of viral copies(<138 copies/mL). A negative result must be combined with clinical observations, patient history, and epidemiological information. The expected result is Negative.  Fact Sheet for Patients:  EntrepreneurPulse.com.au  Fact Sheet for Healthcare Providers:  IncredibleEmployment.be  This test is no t yet approved or cleared by the [place] FDA and  has been authorized for detection and/or diagnosis of SARS-CoV-2 by FDA under an Emergency Use Authorization (EUA). This EUA will remain  in effect (meaning this test can be used) for  the duration of the COVID-19 declaration under Section 564(b)(1) of the Act, 21 U.S.C.section 360bbb-3(b)(1), unless the authorization is terminated  or revoked sooner.       Influenza A by PCR NEGATIVE NEGATIVE Final   Influenza B by PCR NEGATIVE NEGATIVE Final    Comment: (NOTE) The Xpert Xpress SARS-CoV-2/FLU/RSV plus assay is intended as an aid in the diagnosis of influenza from Nasopharyngeal swab specimens and should not be used as a sole basis for treatment. Nasal washings and aspirates are unacceptable for Xpert Xpress SARS-CoV-2/FLU/RSV testing.  Fact Sheet for Patients: EntrepreneurPulse.com.au  Fact Sheet for Healthcare Providers: IncredibleEmployment.be  This test is not yet approved or cleared by the [place] FDA and has been authorized for detection and/or diagnosis of SARS-CoV-2 by FDA under an Emergency Use Authorization (EUA). This EUA will remain in effect (meaning this test can be used) for the duration of the COVID-19 declaration under Section 564(b)(1) of the Act, 21 U.S.C. section 360bbb-3(b)(1), unless the authorization is terminated or revoked.  Performed at [facility], [place] [address], [place], [place] [address]   Urine Culture     Status: Abnormal   Collection Time: 05/01/21  6:44 PM   Specimen: Urine, Catheterized  Result Value Ref Range Status   Specimen Description URINE, CATHETERIZED  Final   Special Requests NONE  Final   Culture (A)  Final    <10,000 COLONIES/mL INSIGNIFICANT GROWTH Performed at [facility], [place] [address], [place], [place] [address]    Report Status 05/02/2021 FINAL  Final  Blood culture (routine x 2)     Status: None (Preliminary result)   Collection Time: 05/01/21 11:00 PM   Specimen: BLOOD  Result Value Ref Range Status   Specimen Description BLOOD LEFT ANTECUBITAL  Final   Special Requests IN PEDIATRIC BOTTLE Blood Culture adequate volume  Final   Culture   Final    NO GROWTH 4 DAYS Performed at [facility], [place] [address] [place], [place] [address]    Report Status PENDING  Incomplete  Blood culture (routine x 2)     Status: Abnormal   Collection Time: 05/01/21 11:22 PM   Specimen: BLOOD  Result Value Ref Range Status   Specimen Description BLOOD RIGHT ARM  Final   Special Requests IN PEDIATRIC BOTTLE Blood Culture adequate volume  Final   Culture  Setup Time   Final    GRAM NEGATIVE RODS IN PEDIATRIC BOTTLE CRITICAL RESULT CALLED TO, READ BACK BY AND VERIFIED WITH: Performed at [facility], [place] [address], [place], [place] [address]    Culture ESCHERICHIA COLI (A)  Final   Report Status 05/04/2021 FINAL  Final   Organism ID, Bacteria ESCHERICHIA COLI  Final      Susceptibility   Escherichia coli - MIC*    AMPICILLIN >=32 RESISTANT Resistant     CEFAZOLIN <=4 SENSITIVE Sensitive     CEFEPIME <=0.12 SENSITIVE Sensitive     CEFTAZIDIME <=1 SENSITIVE Sensitive     CEFTRIAXONE <=0.25 SENSITIVE Sensitive     CIPROFLOXACIN 1 RESISTANT Resistant     GENTAMICIN <=1 SENSITIVE Sensitive     IMIPENEM <=0.25 SENSITIVE Sensitive     TRIMETH/SULFA <=20 SENSITIVE Sensitive      AMPICILLIN/SULBACTAM 16 INTERMEDIATE Intermediate     PIP/TAZO <=4 SENSITIVE Sensitive     * ESCHERICHIA COLI  Blood Culture ID Panel (Reflexed)     Status: Abnormal   Collection Time: 05/01/21 11:22 PM  Result Value Ref Range Status   Enterococcus faecalis NOT DETECTED NOT DETECTED Final   Enterococcus Faecium NOT DETECTED NOT DETECTED Final   Listeria monocytogenes NOT DETECTED NOT DETECTED Final   Staphylococcus species NOT DETECTED NOT DETECTED Final   Staphylococcus aureus (BCID) NOT DETECTED NOT DETECTED Final   Staphylococcus epidermidis NOT DETECTED NOT DETECTED Final   Staphylococcus lugdunensis NOT DETECTED NOT DETECTED Final   Streptococcus species NOT DETECTED NOT DETECTED Final   Streptococcus agalactiae NOT DETECTED NOT DETECTED Final   Streptococcus pneumoniae NOT DETECTED NOT DETECTED Final   Streptococcus pyogenes NOT DETECTED NOT DETECTED Final   A.calcoaceticus-baumannii NOT DETECTED NOT DETECTED Final   Bacteroides fragilis NOT DETECTED NOT DETECTED Final   Enterobacterales DETECTED (A) NOT DETECTED Final    Comment: Enterobacterales represent a large order of gram negative bacteria, not a single organism. CRITICAL RESULT CALLED TO, READ BACK BY AND VERIFIED WITH: PHARMD CAREN AMEND 05/02/21 @1736  BY JW    Enterobacter cloacae complex NOT DETECTED NOT DETECTED Final   Escherichia coli DETECTED (A) NOT DETECTED Final    Comment: CRITICAL RESULT CALLED TO, READ BACK BY AND VERIFIED WITH: PHARMD CAREN AMEND 05/02/21 @1736  BY JW    Klebsiella aerogenes NOT DETECTED NOT DETECTED Final   Klebsiella oxytoca NOT DETECTED NOT DETECTED Final   Klebsiella pneumoniae NOT DETECTED NOT DETECTED Final   Proteus species NOT DETECTED NOT DETECTED Final   Salmonella species NOT DETECTED NOT DETECTED Final   Serratia marcescens NOT DETECTED NOT DETECTED Final   Haemophilus influenzae NOT DETECTED NOT DETECTED Final   Neisseria meningitidis NOT DETECTED NOT DETECTED Final    Pseudomonas aeruginosa NOT DETECTED NOT DETECTED Final   Stenotrophomonas maltophilia NOT DETECTED NOT DETECTED Final   Candida albicans NOT DETECTED NOT DETECTED Final   Candida auris NOT DETECTED NOT DETECTED Final   Candida glabrata NOT DETECTED NOT DETECTED Final   Candida krusei NOT DETECTED NOT DETECTED Final   Candida parapsilosis NOT DETECTED NOT DETECTED Final   Candida tropicalis NOT DETECTED NOT DETECTED Final   Cryptococcus neoformans/gattii NOT DETECTED NOT DETECTED Final   CTX-M ESBL NOT DETECTED NOT DETECTED Final   Carbapenem resistance IMP NOT DETECTED NOT DETECTED Final   Carbapenem resistance KPC NOT DETECTED NOT DETECTED Final   Carbapenem resistance NDM NOT DETECTED NOT DETECTED Final   Carbapenem resist OXA 48 LIKE NOT DETECTED NOT DETECTED Final   Carbapenem resistance VIM NOT DETECTED NOT DETECTED Final    Comment: Performed at [facility], [address] [place], [place] [address]  MRSA Next Gen by PCR, Nasal     Status: None   Collection Time: 05/02/21 12:39 AM   Specimen: Nasal Mucosa; Nasal Swab  Result Value Ref Range Status   MRSA by PCR Next Gen NOT DETECTED NOT DETECTED Final    Comment: (NOTE) The GeneXpert MRSA Assay (FDA approved for NASAL specimens only), is one component of a comprehensive MRSA colonization surveillance program. It is not intended to diagnose MRSA infection nor to guide or monitor treatment for MRSA infections. Test performance is not FDA approved in patients less than 34 years old. Performed at [facility], [place] [address], [place], [place] [address]     Please note: You were cared for by a hospitalist during your hospital stay. Once you are discharged, your primary care physician will handle any further medical issues. Please note that NO REFILLS for any discharge medications will be authorized once you are discharged, as it  is imperative that you return to your primary care physician (or establish a relationship  with a primary care physician if you do not have one) for your post hospital discharge needs so that they can reassess your need for medications and monitor your lab values.    Time coordinating discharge: 40 minutes  SIGNED:   Shelly Coss, MD  [facility] 05/07/2021, 10:10 AM Pager [phone]  If 7PM-7AM, please contact night-coverage [email or web address] Password [email or web address]

## 2021-05-05 NOTE — Progress Notes (Signed)
Inpatient Rehabilitation Admissions Coordinator  ° °I await insurance approval for Cir admit. I met at bedside with patient and her spouse to review cost of care of CIR admit. ° ° , RN, MSN °Rehab Admissions Coordinator °[phone] °05/05/2021 3:44 PM ° °

## 2021-05-05 NOTE — Care Management Important Message (Signed)
Important Message  Patient Details  Name: Jenny Kelly MRN: [id number] Date of Birth: [birth year]   Medicare Important Message Given:  Yes     Sherilyn Banker 05/05/2021, 3:49 PM

## 2021-05-05 NOTE — Progress Notes (Signed)
Occupational Therapy Treatment Patient Details Name: Jenny Kelly MRN: [id number] DOB: 05/19/1945 [name]: 05/05/2021   History of present illness Jenny Kelly is a 76 y.o. female admitted 05/01/21 s transfer from [place] on 1/9 with acute left hemiplegia. 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 En route to from [place] developed dyspnea and respiratory distress and was placed on BiPAP.  There was also concern that she might have had an aspiration event prior to being placed on BiPAP. 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 MRI brain and CT chest pending. PMH includes Allergy, Anemia, Anxiety, Arthritis, Asthma, Atrial fibrillation, Cataract, CHF, CKD, Clotting disorder, COPD, Depression, Diabetes mellitus without complication, Heart murmur, Hyperlipidemia, MI, Neuromuscular disorder, Seizures, Sleep apnea, Stroke, and Thyroid disease.   OT comments  Pt making good progress with all adls and functional mobility. Pt able to get out of bed with +1 assist today. Pt continues to have L side weakness making safety with all adls and mobility an issue. Talked to husband about allowing pt to do what she can for herself while here and after d/c.  Husband is very supportive. Feel CIR is an excellent option for this pt to maximize function and independence to d/c back home independently. Will continue to see with focus on incorporating LUE into all adls and completing more adls in standing.    Recommendations for follow up therapy are one component of a multi-disciplinary discharge planning process, led by the attending physician.  Recommendations may be updated based on patient status, additional functional criteria and insurance authorization.    Follow Up Recommendations  Acute inpatient rehab (3hours/day)    Assistance Recommended at Discharge Frequent or constant Supervision/Assistance  Patient can return home with the  following      Equipment Recommendations  None recommended by OT    Recommendations for Other Services      Precautions / Restrictions Precautions Precautions: Fall Precaution Comments: watch O2 Restrictions Weight Bearing Restrictions: No Other Position/Activity Restrictions: swollen LUE       Mobility Bed Mobility Overal bed mobility: Needs Assistance Bed Mobility: Supine to Sit     Supine to sit: Min assist;HOB elevated (used bed rails)     General bed mobility comments: for trunk elevation and directives to use the rail to assist, Pt has good initiation for bed mobility,    Transfers Overall transfer level: Needs assistance Equipment used: Rolling walker (2 wheels) Transfers: Sit to/from Stand;Bed to chair/wheelchair/BSC Sit to Stand: Mod assist Stand pivot transfers: Mod assist         General transfer comment: Pt mildly impulsive when standing wanting to get to chair quickly due to fatigue. Cues to reach back for chair.     Balance Overall balance assessment: Needs assistance Sitting-balance support: Feet supported;Bilateral upper extremity supported Sitting balance-Leahy Scale: Fair Sitting balance - Comments: close min guard   Standing balance support: Bilateral upper extremity supported;Single extremity supported Standing balance-Leahy Scale: Poor Standing balance comment: dependent on external support                           ADL either performed or assessed with clinical judgement   ADL Overall ADL's : Needs assistance/impaired Eating/Feeding: Set up;Sitting Eating/Feeding Details (indicate cue type and reason): Pt found in bed with husband feeding her.  After pt moved to chair, pt able to finish feeding herself.  Grooming: Wash/dry hands;Wash/dry face;Brushing hair;Set up;Sitting Grooming Details (indicate cue type and reason): sitting EOB for appx 4 min to do simple grooming                 Toilet Transfer: Moderate  assistance;Stand-pivot Toilet Transfer Details (indicate cue type and reason): Pt required assist x1 today and transfers to strong side better than weak side. Pt require cues to make sure chair was behind her and cues to reach back for the chair. Toileting- Clothing Manipulation and Hygiene: Moderate assistance;Sit to/from stand Toileting - Clothing Manipulation Details (indicate cue type and reason): assist with cleaning self due to poor standing balance. Pt held to walker while second person cleaned peri area due to inability to let go of walker to clean self.     Functional mobility during ADLs: Moderate assistance;Standard walker General ADL Comments: L sided weakness, balance, cognition, HOH impacting ADL performance    Extremity/Trunk Assessment Upper Extremity Assessment Upper Extremity Assessment: LUE deficits/detail LUE Deficits / Details: distal stronger than proximal overall 4/5, fine motor coordination deficits present, FF to approx 45degrees LUE: Shoulder pain with ROM LUE Sensation: WNL LUE Coordination: decreased fine motor;decreased gross motor   Lower Extremity Assessment Lower Extremity Assessment: Defer to PT evaluation        Vision       Perception Perception Perception: Within Functional Limits   Praxis Praxis Praxis: Intact    Cognition Arousal/Alertness: Awake/alert Behavior During Therapy: WFL for tasks assessed/performed Overall Cognitive Status: Impaired/Different from baseline Area of Impairment: Memory;Safety/judgement;Awareness;Problem solving                     Memory: Decreased short-term memory   Safety/Judgement: Decreased awareness of safety;Decreased awareness of deficits Awareness: Emergent Problem Solving: Requires verbal cues;Decreased initiation General Comments: Husband is very helpful and likes to spoil the pt by doing for her. Had a nice converation with him about letting her do some for herself and then assisting when pt  gets fatigued.  Cues required for sequencing for transfers.          Exercises     Shoulder Instructions       General Comments Pt  very motivated with supportive husband. Pt moved L arm functionally to assist with transfers.    Pertinent Vitals/ Pain       Pain Assessment: Faces Faces Pain Scale: Hurts little more Facial Expression: Relaxed, neutral Body Movements: Absence of movements Muscle Tension: Relaxed Compliance with ventilator (intubated pts.): N/A Vocalization (extubated pts.): Talking in normal tone or no sound CPOT Total: 0 Pain Location: L arm Pain Descriptors / Indicators: Sore Pain Intervention(s): Monitored during session;Repositioned  Home Living                                          Prior Functioning/Environment              Frequency  Min 2X/week        Progress Toward Goals  OT Goals(current goals can now be found in the care plan section)  Progress towards OT goals: Progressing toward goals  Acute Rehab OT Goals Patient Stated Goal: to get on to rehab OT Goal Formulation: With patient/family Time For Goal Achievement: 05/17/21 Potential to Achieve Goals: Good ADL Goals Pt Will Perform Grooming: with supervision;standing Pt Will Perform Upper Body Dressing: sitting;with set-up Pt Will Perform Lower Body  Dressing: with min guard assist;sit to/from stand Pt Will Transfer to Toilet: with supervision;ambulating Pt Will Perform Toileting - Clothing Manipulation and hygiene: with set-up;sit to/from stand Pt/caregiver will Perform Home Exercise Program: Left upper extremity;With Supervision;With written HEP provided Additional ADL Goal #1: Pt will perform bed mobility at mod I level prior to engaging in ADL  Plan Discharge plan remains appropriate    Co-evaluation                 AM-PAC OT "6 Clicks" Daily Activity     Outcome Measure   Help from another person eating meals?: A Little Help from another  person taking care of personal grooming?: A Little Help from another person toileting, which includes using toliet, bedpan, or urinal?: A Lot Help from another person bathing (including washing, rinsing, drying)?: A Lot Help from another person to put on and taking off regular upper body clothing?: A Little Help from another person to put on and taking off regular lower body clothing?: A Lot 6 Click Score: 15    End of Session Equipment Utilized During Treatment: Gait belt;Oxygen  OT Visit Diagnosis: Unsteadiness on feet (R26.81);Muscle weakness (generalized) (M62.81);Other symptoms and signs involving the nervous system (R29.898);Other symptoms and signs involving cognitive function;Hemiplegia and hemiparesis Hemiplegia - Right/Left: Left Hemiplegia - dominant/non-dominant: Non-Dominant Hemiplegia - caused by: Cerebral infarction   Activity Tolerance Patient tolerated treatment well   Patient Left in chair;with call bell/phone within reach;with chair alarm set;with family/visitor present   Nurse Communication Mobility status;Precautions        Time: 3212-2482 OT Time Calculation (min): 31 min  Charges: OT General Charges $OT Visit: 1 Visit OT Treatments $Self Care/Home Management : 23-37 mins   Hope Budds 05/05/2021, 1:25 PM

## 2021-05-06 LAB — CBC WITH DIFFERENTIAL/PLATELET
Abs Immature Granulocytes: 0 10*3/uL (ref 0.00–0.07)
Basophils Absolute: 0 10*3/uL (ref 0.0–0.1)
Basophils Relative: 0 %
Eosinophils Absolute: 0.5 10*3/uL (ref 0.0–0.5)
Eosinophils Relative: 5 %
HCT: 29.9 % — ABNORMAL LOW (ref 36.0–46.0)
Hemoglobin: 8.9 g/dL — ABNORMAL LOW (ref 12.0–15.0)
Lymphocytes Relative: 22 %
Lymphs Abs: 2.2 10*3/uL (ref 0.7–4.0)
MCH: 26.8 pg (ref 26.0–34.0)
MCHC: 29.8 g/dL — ABNORMAL LOW (ref 30.0–36.0)
MCV: 90.1 fL (ref 80.0–100.0)
Monocytes Absolute: 0.4 10*3/uL (ref 0.1–1.0)
Monocytes Relative: 4 %
Neutro Abs: 7 10*3/uL (ref 1.7–7.7)
Neutrophils Relative %: 69 %
Platelets: 144 10*3/uL — ABNORMAL LOW (ref 150–400)
RBC: 3.32 MIL/uL — ABNORMAL LOW (ref 3.87–5.11)
RDW: 20.2 % — ABNORMAL HIGH (ref 11.5–15.5)
WBC: 10.2 10*3/uL (ref 4.0–10.5)
nRBC: 0 % (ref 0.0–0.2)
nRBC: 0 /100 WBC

## 2021-05-06 LAB — BASIC METABOLIC PANEL
Anion gap: 4 — ABNORMAL LOW (ref 5–15)
BUN: 8 mg/dL (ref 8–23)
CO2: 22 mmol/L (ref 22–32)
Calcium: 7.9 mg/dL — ABNORMAL LOW (ref 8.9–10.3)
Chloride: 113 mmol/L — ABNORMAL HIGH (ref 98–111)
Creatinine, Ser: 1.11 mg/dL — ABNORMAL HIGH (ref 0.44–1.00)
GFR, Estimated: 52 mL/min — ABNORMAL LOW (ref >60)
Glucose, Bld: 96 mg/dL (ref 70–99)
Potassium: 3.8 mmol/L (ref 3.5–5.1)
Sodium: 139 mmol/L (ref 135–145)

## 2021-05-06 NOTE — Progress Notes (Signed)
Physical Therapy Treatment Patient Details Name: BRISTOL GAUTHREAUX MRN: [id number] DOB: 06-14-1945 [name]: 05/06/2021   History of Present Illness DASANY HAUER is a 76 y.o. female admitted 05/01/21 s transfer from [place] on 1/9 with acute left hemiplegia. 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 En route to from [place] developed dyspnea and respiratory distress and was placed on BiPAP.  There was also concern that she might have had an aspiration event prior to being placed on BiPAP. 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 MRI brain and CT chest pending. PMH includes Allergy, Anemia, Anxiety, Arthritis, Asthma, Atrial fibrillation, Cataract, CHF, CKD, Clotting disorder, COPD, Depression, Diabetes mellitus without complication, Heart murmur, Hyperlipidemia, MI, Neuromuscular disorder, Seizures, Sleep apnea, Stroke, and Thyroid disease.    PT Comments    Pt agreeable to participate in therapy once mesh underwear was donned to hold purwik in place. Pt was very fearful of incontinence. She did well with HHA for transfer to recliner chair. Feel she would be ready to attempt gait with HHA next session. Pt performed repeat sit<>stand in recliner chair for neuromuscular re-ed.Will continue to follow acutely. Current plan remains appropriate.     Recommendations for follow up therapy are one component of a multi-disciplinary discharge planning process, led by the attending physician.  Recommendations may be updated based on patient status, additional functional criteria and insurance authorization.  Follow Up Recommendations  Acute inpatient rehab (3hours/day)     Assistance Recommended at Discharge Frequent or constant Supervision/Assistance  Patient can return home with the following A lot of help with walking and/or transfers;A lot of help with bathing/dressing/bathroom;Assistance with cooking/housework;Assistance with  feeding;Direct supervision/assist for medications management;Direct supervision/assist for financial management;Assist for transportation   Equipment Recommendations  Other (comment) (TBD)    Recommendations for Other Services Rehab consult     Precautions / Restrictions Precautions Precautions: Fall Precaution Comments: watch O2 Restrictions Weight Bearing Restrictions: No Other Position/Activity Restrictions: swollen LUE     Mobility  Bed Mobility Overal bed mobility: Needs Assistance Bed Mobility: Supine to Sit;Rolling Rolling: Supervision   Supine to sit: Min assist;HOB elevated (used bed rails)     General bed mobility comments: min A for trunk elevation.    Transfers Overall transfer level: Needs assistance Equipment used: 1 person hand held assist Transfers: Sit to/from Stand;Bed to chair/wheelchair/BSC Sit to Stand: Mod assist Stand pivot transfers: Min assist         General transfer comment: Light mod A to stand and min A to pivot to recliner chair. Face to face with HHA for transfers    Ambulation/Gait                   Stairs             Wheelchair Mobility    Modified Rankin (Stroke Patients Only) Modified Rankin (Stroke Patients Only) Pre-Morbid Rankin Score: Slight disability Modified Rankin: Moderately severe disability     Balance Overall balance assessment: Needs assistance Sitting-balance support: Feet supported;Bilateral upper extremity supported Sitting balance-Leahy Scale: Fair Sitting balance - Comments: close min guard Postural control: Posterior lean Standing balance support: Bilateral upper extremity supported;Single extremity supported Standing balance-Leahy Scale: Poor Standing balance comment: dependent on external support                            Cognition Arousal/Alertness: Awake/alert Behavior  During Therapy: WFL for tasks assessed/performed Overall Cognitive Status: Impaired/Different  from baseline Area of Impairment: Memory;Safety/judgement;Awareness;Problem solving                     Memory: Decreased short-term memory   Safety/Judgement: Decreased awareness of safety;Decreased awareness of deficits Awareness: Emergent Problem Solving: Requires verbal cues;Decreased initiation          Exercises Other Exercises Other Exercises: Sit <> stand 5x Other Exercises: Mini bridge 3x to don mesh panties. Pt able to lift hips but unable to sustain.    General Comments General comments (skin integrity, edema, etc.): pt with complaint of sore labia due to frequent changing of purwik and initially declining PT because it would mean getting a new one. Advised pt we could try mesh underwear to hold purwik in place. pt agreeable.      Pertinent Vitals/Pain Pain Assessment: Faces Faces Pain Scale: Hurts a little bit Pain Location: L arm Pain Descriptors / Indicators: Sore Pain Intervention(s): Limited activity within patient's tolerance;Monitored during session    Home Living                          Prior Function            PT Goals (current goals can now be found in the care plan section) Acute Rehab PT Goals Patient Stated Goal: to be independent PT Goal Formulation: With patient/family Time For Goal Achievement: 05/17/21 Potential to Achieve Goals: Good Progress towards PT goals: Progressing toward goals    Frequency    Min 4X/week      PT Plan Current plan remains appropriate    Co-evaluation              AM-PAC PT "6 Clicks" Mobility   Outcome Measure  Help needed turning from your back to your side while in a flat bed without using bedrails?: A Lot Help needed moving from lying on your back to sitting on the side of a flat bed without using bedrails?: A Lot Help needed moving to and from a bed to a chair (including a wheelchair)?: A Lot Help needed standing up from a chair using your arms (e.g., wheelchair or bedside  chair)?: A Lot Help needed to walk in hospital room?: A Lot Help needed climbing 3-5 steps with a railing? : Total 6 Click Score: 11    End of Session   Activity Tolerance: Patient tolerated treatment well Patient left: in chair;with call bell/phone within reach;with chair alarm set Nurse Communication: Mobility status PT Visit Diagnosis: Unsteadiness on feet (R26.81);Muscle weakness (generalized) (M62.81);Other abnormalities of gait and mobility (R26.89);Other symptoms and signs involving the nervous system (R29.898)     Time: [id number] PT Time Calculation (min) (ACUTE ONLY): 19 min  Charges:  $Neuromuscular Re-education: 8-22 mins                    Benjiman Core, [place] Pager [phone] Acute Rehab  Allena Katz 05/06/2021, 4:12 PM

## 2021-05-06 NOTE — Progress Notes (Signed)
Inpatient Rehab Admissions Coordinator:  Received insurance authorization. Awaiting bed availability. Will continue to follow.  Wolfgang Phoenix, MS, CCC-SLP Admissions Coordinator [phone]

## 2021-05-06 NOTE — Progress Notes (Signed)
SLP Cancellation Note  Patient Details Name: Jenny Kelly MRN: [id number] DOB: [birth year]  Orders received to evaluate swallow in order to advance diet. [name] is currently on dysphagia 2 and can be re-evaluated after she arrives in inpatient rehab.   No further acute care needs.  Gwendolin Briel L. Tivis Ringer, [place] CCC/SLP Acute Rehabilitation Services Office number [phone] Pager [phone]          [name] 05/06/2021, 11:24 AM

## 2021-05-06 NOTE — Progress Notes (Signed)
Patient seen and examined at  the bedside this morning.  Hemodynamically stable.  On room air.  Denies any complaints.  There is no change in medical management.  Patient is medically stable for discharge to CIR as soon as possible.  Discharge and summary are in place.

## 2021-05-07 ENCOUNTER — Inpatient Hospital Stay (HOSPITAL_COMMUNITY)
Admit: 2021-05-07 | Discharge: 2021-05-16 | DRG: 057 | Disposition: A | Discharge status: Home Health | Payer: Medicare HMO | Source: Intra-hospital | Attending: Physical Medicine and Rehabilitation | Admitting: Physical Medicine and Rehabilitation

## 2021-05-07 ENCOUNTER — Other Ambulatory Visit: Payer: Self-pay

## 2021-05-07 ENCOUNTER — Encounter (HOSPITAL_COMMUNITY): Payer: Self-pay | Admitting: Physical Medicine and Rehabilitation

## 2021-05-07 DIAGNOSIS — Z79899 Other long term (current) drug therapy: Secondary | ICD-10-CM

## 2021-05-07 DIAGNOSIS — Z91040 Latex allergy status: Secondary | ICD-10-CM

## 2021-05-07 DIAGNOSIS — R1310 Dysphagia, unspecified: Secondary | ICD-10-CM | POA: Diagnosis present

## 2021-05-07 DIAGNOSIS — R0781 Pleurodynia: Secondary | ICD-10-CM | POA: Diagnosis not present

## 2021-05-07 DIAGNOSIS — J449 Chronic obstructive pulmonary disease, unspecified: Secondary | ICD-10-CM | POA: Diagnosis present

## 2021-05-07 DIAGNOSIS — I63511 Cerebral infarction due to unspecified occlusion or stenosis of right middle cerebral artery: Secondary | ICD-10-CM

## 2021-05-07 DIAGNOSIS — Z951 Presence of aortocoronary bypass graft: Secondary | ICD-10-CM | POA: Diagnosis not present

## 2021-05-07 DIAGNOSIS — K5900 Constipation, unspecified: Secondary | ICD-10-CM | POA: Diagnosis present

## 2021-05-07 DIAGNOSIS — J8410 Pulmonary fibrosis, unspecified: Secondary | ICD-10-CM | POA: Diagnosis not present

## 2021-05-07 DIAGNOSIS — I63311 Cerebral infarction due to thrombosis of right middle cerebral artery: Secondary | ICD-10-CM | POA: Diagnosis present

## 2021-05-07 DIAGNOSIS — Z96653 Presence of artificial knee joint, bilateral: Secondary | ICD-10-CM | POA: Diagnosis present

## 2021-05-07 DIAGNOSIS — Z87891 Personal history of nicotine dependence: Secondary | ICD-10-CM | POA: Diagnosis not present

## 2021-05-07 DIAGNOSIS — I480 Paroxysmal atrial fibrillation: Secondary | ICD-10-CM | POA: Diagnosis present

## 2021-05-07 DIAGNOSIS — Z881 Allergy status to other antibiotic agents status: Secondary | ICD-10-CM

## 2021-05-07 DIAGNOSIS — I10 Essential (primary) hypertension: Secondary | ICD-10-CM | POA: Diagnosis present

## 2021-05-07 DIAGNOSIS — R2689 Other abnormalities of gait and mobility: Secondary | ICD-10-CM | POA: Diagnosis present

## 2021-05-07 DIAGNOSIS — I69354 Hemiplegia and hemiparesis following cerebral infarction affecting left non-dominant side: Principal | ICD-10-CM

## 2021-05-07 DIAGNOSIS — I69391 Dysphagia following cerebral infarction: Secondary | ICD-10-CM

## 2021-05-07 DIAGNOSIS — I959 Hypotension, unspecified: Secondary | ICD-10-CM | POA: Diagnosis not present

## 2021-05-07 DIAGNOSIS — K8689 Other specified diseases of pancreas: Secondary | ICD-10-CM

## 2021-05-07 DIAGNOSIS — R413 Other amnesia: Secondary | ICD-10-CM | POA: Diagnosis present

## 2021-05-07 DIAGNOSIS — Z882 Allergy status to sulfonamides status: Secondary | ICD-10-CM

## 2021-05-07 DIAGNOSIS — E119 Type 2 diabetes mellitus without complications: Secondary | ICD-10-CM

## 2021-05-07 DIAGNOSIS — B9620 Unspecified Escherichia coli [E. coli] as the cause of diseases classified elsewhere: Secondary | ICD-10-CM | POA: Diagnosis present

## 2021-05-07 DIAGNOSIS — R7881 Bacteremia: Secondary | ICD-10-CM | POA: Diagnosis not present

## 2021-05-07 DIAGNOSIS — Z888 Allergy status to other drugs, medicaments and biological substances status: Secondary | ICD-10-CM | POA: Diagnosis not present

## 2021-05-07 DIAGNOSIS — R319 Hematuria, unspecified: Secondary | ICD-10-CM | POA: Diagnosis not present

## 2021-05-07 DIAGNOSIS — I700 Atherosclerosis of aorta: Secondary | ICD-10-CM | POA: Diagnosis not present

## 2021-05-07 DIAGNOSIS — U099 Post covid-19 condition, unspecified: Secondary | ICD-10-CM | POA: Diagnosis present

## 2021-05-07 DIAGNOSIS — E785 Hyperlipidemia, unspecified: Secondary | ICD-10-CM | POA: Diagnosis present

## 2021-05-07 DIAGNOSIS — Z960 Presence of urogenital implants: Secondary | ICD-10-CM | POA: Diagnosis present

## 2021-05-07 DIAGNOSIS — R109 Unspecified abdominal pain: Secondary | ICD-10-CM | POA: Diagnosis not present

## 2021-05-07 DIAGNOSIS — I69398 Other sequelae of cerebral infarction: Secondary | ICD-10-CM | POA: Diagnosis present

## 2021-05-07 DIAGNOSIS — Z8719 Personal history of other diseases of the digestive system: Secondary | ICD-10-CM

## 2021-05-07 LAB — URINALYSIS, ROUTINE W REFLEX MICROSCOPIC
Bilirubin Urine: NEGATIVE
Glucose, UA: NEGATIVE mg/dL
Ketones, ur: NEGATIVE mg/dL
Nitrite: NEGATIVE
Protein, ur: NEGATIVE mg/dL
Specific Gravity, Urine: 1.01 (ref 1.005–1.030)
pH: 7.5 (ref 5.0–8.0)

## 2021-05-07 LAB — CULTURE, BLOOD (ROUTINE X 2)
Culture: NO GROWTH
Special Requests: ADEQUATE

## 2021-05-07 LAB — URINALYSIS, MICROSCOPIC (REFLEX): RBC / HPF: >50 RBC/hpf (ref 0–5)

## 2021-05-07 MED ORDER — GUAIFENESIN-DM 100-10 MG/5ML PO SYRP: 5.0000 mL | ORAL_SOLUTION | Freq: Four times a day (QID) | ORAL | Status: DC | PRN

## 2021-05-07 MED ORDER — ACETAMINOPHEN 325 MG PO TABS
325.0000 mg | ORAL_TABLET | ORAL | Status: DC | PRN
  Administered 2021-05-09 – 2021-05-16 (×3): 650 mg via ORAL
  Filled 2021-05-07 (×4): qty 2

## 2021-05-07 MED ORDER — RIVAROXABAN 15 MG PO TABS
15.0000 mg | ORAL_TABLET | Freq: Every day | ORAL | Status: DC
  Administered 2021-05-08 – 2021-05-16 (×9): 15 mg via ORAL
  Filled 2021-05-07 (×9): qty 1

## 2021-05-07 MED ORDER — PREGABALIN 50 MG PO CAPS
50.0000 mg | ORAL_CAPSULE | Freq: Two times a day (BID) | ORAL | Status: DC
  Administered 2021-05-07 – 2021-05-16 (×18): 50 mg via ORAL
  Filled 2021-05-07 (×18): qty 1

## 2021-05-07 MED ORDER — PRAVASTATIN SODIUM 40 MG PO TABS
40.0000 mg | ORAL_TABLET | Freq: Every day | ORAL | Status: DC
  Administered 2021-05-07 – 2021-05-15 (×9): 40 mg via ORAL
  Filled 2021-05-07 (×9): qty 1

## 2021-05-07 MED ORDER — FAMOTIDINE 20 MG PO TABS
20.0000 mg | ORAL_TABLET | Freq: Every day | ORAL | Status: DC
  Administered 2021-05-08 – 2021-05-16 (×9): 20 mg via ORAL
  Filled 2021-05-07 (×9): qty 1

## 2021-05-07 MED ORDER — VITAMIN D (ERGOCALCIFEROL) 1.25 MG (50000 UNIT) PO CAPS
50000.0000 [IU] | ORAL_CAPSULE | ORAL | Status: DC
  Administered 2021-05-10: 50000 [IU] via ORAL
  Filled 2021-05-07: qty 1

## 2021-05-07 MED ORDER — IPRATROPIUM BROMIDE 0.06 % NA SOLN
2.0000 | Freq: Two times a day (BID) | NASAL | Status: DC
  Administered 2021-05-08 – 2021-05-15 (×11): 2 via NASAL
  Filled 2021-05-07: qty 15

## 2021-05-07 MED ORDER — PANTOPRAZOLE SODIUM 40 MG PO TBEC: 40.0000 mg | DELAYED_RELEASE_TABLET | Freq: Every day | ORAL | Status: DC

## 2021-05-07 MED ORDER — ONDANSETRON HCL 4 MG PO TABS
4.0000 mg | ORAL_TABLET | Freq: Four times a day (QID) | ORAL | Status: DC | PRN
  Administered 2021-05-08: 4 mg via ORAL
  Filled 2021-05-07 (×2): qty 1

## 2021-05-07 MED ORDER — UMECLIDINIUM-VILANTEROL 62.5-25 MCG/ACT IN AEPB
1.0000 | INHALATION_SPRAY | Freq: Every day | RESPIRATORY_TRACT | Status: DC
  Administered 2021-05-08 – 2021-05-16 (×7): 1 via RESPIRATORY_TRACT
  Filled 2021-05-07: qty 14

## 2021-05-07 MED ORDER — ONDANSETRON HCL 4 MG/2ML IJ SOLN: 4.0000 mg | Freq: Four times a day (QID) | INTRAMUSCULAR | Status: DC | PRN

## 2021-05-07 MED ORDER — ASPIRIN EC 81 MG PO TBEC
81.0000 mg | DELAYED_RELEASE_TABLET | Freq: Every day | ORAL | Status: DC
  Administered 2021-05-07 – 2021-05-16 (×10): 81 mg via ORAL
  Filled 2021-05-07 (×10): qty 1

## 2021-05-07 MED ORDER — DOCUSATE SODIUM 100 MG PO CAPS
100.0000 mg | ORAL_CAPSULE | Freq: Two times a day (BID) | ORAL | Status: DC
  Administered 2021-05-07 – 2021-05-09 (×4): 100 mg via ORAL
  Filled 2021-05-07 (×4): qty 1

## 2021-05-07 MED ORDER — TRAZODONE HCL 50 MG PO TABS: 25.0000 mg | ORAL_TABLET | Freq: Every evening | ORAL | Status: DC | PRN

## 2021-05-07 MED ORDER — TOPIRAMATE 100 MG PO TABS
200.0000 mg | ORAL_TABLET | Freq: Two times a day (BID) | ORAL | Status: DC
  Administered 2021-05-07 – 2021-05-16 (×18): 200 mg via ORAL
  Filled 2021-05-07 (×2): qty 2
  Filled 2021-05-07: qty 8
  Filled 2021-05-07: qty 2
  Filled 2021-05-07 (×5): qty 8
  Filled 2021-05-07 (×3): qty 2
  Filled 2021-05-07: qty 8
  Filled 2021-05-07 (×5): qty 2
  Filled 2021-05-07 (×2): qty 8
  Filled 2021-05-07: qty 2

## 2021-05-07 MED ORDER — LEVOTHYROXINE SODIUM 88 MCG PO TABS
88.0000 ug | ORAL_TABLET | Freq: Every day | ORAL | Status: DC
  Administered 2021-05-08 – 2021-05-16 (×8): 88 ug via ORAL
  Filled 2021-05-07 (×9): qty 1

## 2021-05-07 MED ORDER — AZELASTINE HCL 0.1 % NA SOLN
1.0000 | Freq: Two times a day (BID) | NASAL | Status: DC
  Administered 2021-05-08 – 2021-05-15 (×12): 1 via NASAL
  Filled 2021-05-07: qty 30

## 2021-05-07 MED ORDER — PANTOPRAZOLE SODIUM 40 MG PO TBEC
40.0000 mg | DELAYED_RELEASE_TABLET | Freq: Every day | ORAL | Status: DC
  Administered 2021-05-08 – 2021-05-15 (×8): 40 mg via ORAL
  Filled 2021-05-07 (×8): qty 1

## 2021-05-07 MED ORDER — DONEPEZIL HCL 10 MG PO TABS
10.0000 mg | ORAL_TABLET | Freq: Every day | ORAL | Status: DC
  Administered 2021-05-07 – 2021-05-15 (×9): 10 mg via ORAL
  Filled 2021-05-07 (×10): qty 1

## 2021-05-07 MED ORDER — CEPHALEXIN 250 MG PO CAPS
500.0000 mg | ORAL_CAPSULE | Freq: Three times a day (TID) | ORAL | Status: AC
  Administered 2021-05-07 – 2021-05-09 (×7): 500 mg via ORAL
  Filled 2021-05-07 (×7): qty 2

## 2021-05-07 MED ORDER — ADULT MULTIVITAMIN W/MINERALS CH
1.0000 | ORAL_TABLET | Freq: Every day | ORAL | Status: DC
  Administered 2021-05-08 – 2021-05-16 (×9): 1 via ORAL
  Filled 2021-05-07 (×9): qty 1

## 2021-05-07 MED ORDER — ALUM & MAG HYDROXIDE-SIMETH 200-200-20 MG/5ML PO SUSP: 30.0000 mL | ORAL | Status: DC | PRN

## 2021-05-07 MED ORDER — ALBUTEROL SULFATE (2.5 MG/3ML) 0.083% IN NEBU: 2.5000 mg | INHALATION_SOLUTION | Freq: Four times a day (QID) | RESPIRATORY_TRACT | Status: DC | PRN

## 2021-05-07 MED ORDER — MONTELUKAST SODIUM 10 MG PO TABS
10.0000 mg | ORAL_TABLET | Freq: Every day | ORAL | Status: DC
  Administered 2021-05-07 – 2021-05-15 (×9): 10 mg via ORAL
  Filled 2021-05-07 (×8): qty 1

## 2021-05-07 MED ORDER — BUDESONIDE 0.25 MG/2ML IN SUSP
0.2500 mg | Freq: Two times a day (BID) | RESPIRATORY_TRACT | Status: DC
  Administered 2021-05-07 – 2021-05-16 (×16): 0.25 mg via RESPIRATORY_TRACT
  Filled 2021-05-07 (×20): qty 2

## 2021-05-07 MED ORDER — FLUTICASONE PROPIONATE 50 MCG/ACT NA SUSP
1.0000 | Freq: Every day | NASAL | Status: DC
  Administered 2021-05-08 – 2021-05-15 (×6): 1 via NASAL
  Filled 2021-05-07: qty 16

## 2021-05-07 NOTE — H&P (Signed)
Physical Medicine and Rehabilitation Admission H&P    Chief Complaint  Patient presents with   Shortness of Breath    Code Stroke  : HPI: 76 year old female with history of atrial fibrillation on Xarelto who presented to [facility] on 05/01/2021 with acute left hemiplegia.  CT angiogram showed acute M1 M2 occlusion on the right side.  UA showed evidence of urinary tract infection.  She had hypotension requiring fluid boluses felt to be related to urosepsis.  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  Patient initially required pressor support in the ICU setting.  Had a 25 beat run of V. tach and dopamine drip was discontinued.  Left ureteral stone was discovered and underwent cystoscopy with left ureteral stent placement on 1/9 no neuro interventional procedures were recommended.  Other past medical history includes diabetes CKD 3 coronary artery disease status post CABG The patient was evaluated by neurology, stroke team, due to intracranial stenosis recommendations were to keep blood pressure goal from 1 87-5 50 systolic. The patient was evaluated by Dr. Alfonse Spruce from cardiology.  Echocardiogram showed normal EF with no significant valvular disease or source of embolism, grade 2 diastolic dysfunction was present. The patient was seen by electrophysiology, Dr. Caryl Comes for evaluation of symptomatic bradycardia.  Amiodarone was discontinued Evaluated by speech therapy placed on a D2 diet. Review of Systems  Constitutional:  Negative for chills and fever.  HENT:  Negative for congestion, sinus pain and sore throat.   Eyes:  Negative for pain, discharge and redness.  Respiratory:  Positive for cough and wheezing. Negative for shortness of breath.   Cardiovascular:  Negative for chest pain and leg swelling.  Gastrointestinal:  Negative for diarrhea, nausea and vomiting.  Genitourinary:  Negative for dysuria and flank pain.   Musculoskeletal:  Positive for joint pain. Negative for back pain and neck pain.  Skin:  Negative for itching and rash.  Neurological:  Positive for focal weakness and headaches. Negative for speech change.  Endo/Heme/Allergies:  Does not bruise/bleed easily.  Psychiatric/Behavioral:  Positive for memory loss. Negative for depression and hallucinations.   Past Medical History:  Diagnosis Date   Allergy    Anemia    Anxiety    Arthritis    Asthma    Atrial fibrillation ([place])    Blood transfusion without reported diagnosis    Cataract    CHF (congestive heart failure) (HCC)    Chronic kidney disease    Clotting disorder (HCC)    COPD (chronic obstructive pulmonary disease) ([place])    Depression    Diabetes mellitus without complication (HCC)    GERD (gastroesophageal reflux disease)    Heart murmur    Hyperlipidemia    Myocardial infarction ([place])    Neuromuscular disorder (HCC)    tremors   Seizures ([place])    Sleep apnea    Stroke ([place])    Thyroid disease    Past Surgical History:  Procedure Laterality Date   ABDOMINAL HYSTERECTOMY     APPENDECTOMY     bladder tack     CHOLECYSTECTOMY     CORONARY ARTERY BYPASS GRAFT     CYSTOSCOPY W/ URETERAL STENT PLACEMENT Left 05/01/2021   Procedure: CYSTOSCOPY WITH RETROGRADE PYELOGRAM/URETERAL STENT PLACEMENT;  Surgeon: Ceasar Mons, MD;  Location: [place];  Service: Urology;  Laterality: Left;   ESOPHAGOGASTRODUODENOSCOPY ENDOSCOPY     EYE SURGERY     FRACTURE SURGERY  Right    3rd finger   JOINT REPLACEMENT Bilateral    knees   TONSILLECTOMY AND ADENOIDECTOMY     TUBAL LIGATION     Family History  Family history unknown: Yes   Social History:  reports that she quit smoking about 20 years ago. Her smoking use included cigarettes. She has a 15.00 pack-year smoking history. She has never used smokeless tobacco. She reports that she does not currently use alcohol. She reports that she does not use drugs. Allergies:   Allergies  Allergen Reactions   Ciprofloxacin Hives    Ask patient   Prochlorperazine Edisylate Other (See Comments)    Cant swallow Muscle cramping   Prednisone    Reduced Iso-Alpha Acids Complex    Statins Other (See Comments)    Muscle weakness    Sulfa Antibiotics Swelling   Azithromycin Rash   Latex Rash   Medications Prior to Admission  Medication Sig Dispense Refill   amiodarone (PACERONE) 100 MG tablet Take 50 mg by mouth at bedtime.     Cyanocobalamin (B-12 PO) Take by mouth.     donepezil (ARICEPT) 10 MG tablet Take 10 mg by mouth at bedtime.     ergocalciferol (VITAMIN D2) 1.25 MG (50000 UT) capsule Take 50,000 Units by mouth See admin instructions. Takes twice a week on Wednesday and Saturday     guaiFENesin (MUCINEX PO) Take 1 tablet by mouth as needed (cough, congestion).     levothyroxine (SYNTHROID) 88 MCG tablet Take 88 mcg by mouth daily before breakfast.     montelukast (SINGULAIR) 10 MG tablet Take 10 mg by mouth at bedtime.     omeprazole (PRILOSEC) 40 MG capsule Take 40 mg by mouth daily.     Phenazopyridine HCl (URISTAT PO) Take 1 tablet by mouth as needed (UTI).     pregabalin (LYRICA) 50 MG capsule Take 50 mg by mouth 2 (two) times daily.     rivaroxaban (XARELTO) 20 MG TABS tablet Take 20 mg by mouth every evening.     topiramate (TOPAMAX) 200 MG tablet Take 200 mg by mouth 2 (two) times daily.     albuterol (PROVENTIL HFA) 108 (90 Base) MCG/ACT inhaler Inhale two puffs every 4-6 hours if needed for coughing, wheezing, or shortness of breath. (Patient not taking: Reported on 05/01/2021) 1 each 1   azelastine (ASTELIN) 0.1 % nasal spray Use one spray in each nostril twice daily (Patient not taking: Reported on 05/01/2021) 30 mL 5   budesonide (PULMICORT) 0.5 MG/2ML nebulizer solution Take 2 mLs (0.5 mg total) by nebulization in the morning and at bedtime. (Patient not taking: Reported on 05/01/2021) 120 mL 5   famotidine (PEPCID) 40 MG tablet Take one tablet once  daily (Patient not taking: Reported on 05/01/2021) 30 tablet 5   fluticasone (FLONASE) 50 MCG/ACT nasal spray SPRAY 1 SPRAY INTO BOTH NOSTRILS DAILY. (Patient not taking: Reported on 05/01/2021) 48 mL 2   Fluticasone-Umeclidin-Vilant (TRELEGY ELLIPTA) 100-62.5-25 MCG/INH AEPB Inhale 1 puff into the lungs daily. (Patient not taking: Reported on 05/01/2021) 28 each 6   ipratropium (ATROVENT) 0.03 % nasal spray SPRAY 2 SPRAYS INTO BOTH NOSTRILS EVERY 12 HOURS. (Patient not taking: Reported on 05/01/2021) 90 mL 3    Drug Regimen Review  Drug regimen was reviewed and remains appropriate with no significant issues identified  Home: Home Living Family/patient expects to be discharged to:: Inpatient rehab Living Arrangements: Spouse/significant other Available Help at Discharge: Family, Available 24 hours/day Type of Home: [place]  Access: Level entry Home Layout: One level Bathroom Shower/Tub: Tub/shower unit, Door [employer] Toilet: Standard Bathroom Accessibility: Yes Home Equipment: Conservation officer, nature (2 wheels), BSC/3in1, Civil engineer, contracting  Lives With: Spouse   Functional History: Prior Function Prior Level of Function : Needs assist  Cognitive Assist : ADLs (cognitive) ADLs (Cognitive):  (IADL medicine and financial management by husband) Physical Assist : ADLs (physical) ADLs (physical): Dressing, Bathing, IADLs Mobility Comments: ambulating independently with no AD ADLs Comments: assisting with bathing and dressing  Functional Status:  Mobility: Bed Mobility Overal bed mobility: Needs Assistance Bed Mobility: Supine to Sit, Rolling Rolling: Supervision Supine to sit: Min assist, HOB elevated (used bed rails) General bed mobility comments: min A for trunk elevation. Transfers Overall transfer level: Needs assistance Equipment used: 1 person hand held assist Transfers: Sit to/from Stand, Bed to chair/wheelchair/BSC Sit to Stand: Mod assist Bed to/from chair/wheelchair/BSC transfer type::  Stand pivot Stand pivot transfers: Min assist Step pivot transfers: Mod assist, +2 safety/equipment General transfer comment: Light mod A to stand and min A to pivot to recliner chair. Face to face with HHA for transfers      ADL: ADL Overall ADL's : Needs assistance/impaired Eating/Feeding: Set up, Sitting Eating/Feeding Details (indicate cue type and reason): Pt found in bed with husband feeding her.  After pt moved to chair, pt able to finish feeding herself. Grooming: Wash/dry hands, Wash/dry face, Brushing hair, Set up, Sitting Grooming Details (indicate cue type and reason): sitting EOB for appx 4 min to do simple grooming Upper Body Bathing: Moderate assistance, Sitting Upper Body Bathing Details (indicate cue type and reason): husband assists her at baseline Lower Body Bathing: Moderate assistance, Sitting/lateral leans Lower Body Bathing Details (indicate cue type and reason): Pt is able to perform figure 4 with BLE Upper Body Dressing : Minimal assistance, Sitting Lower Body Dressing: Minimal assistance, Sitting/lateral leans Lower Body Dressing Details (indicate cue type and reason): will likely need more assist for cloting that requires sit<>stand Toilet Transfer: Moderate assistance, Buyer, retail Details (indicate cue type and reason): Pt required assist x1 today and transfers to strong side better than weak side. Pt require cues to make sure chair was behind her and cues to reach back for the chair. Toileting- Clothing Manipulation and Hygiene: Moderate assistance, Sit to/from stand Toileting - Clothing Manipulation Details (indicate cue type and reason): assist with cleaning self due to poor standing balance. Pt held to walker while second person cleaned peri area due to inability to let go of walker to clean self. Tub/ Shower Transfer: Moderate assistance, Ambulation, Shower seat Functional mobility during ADLs: Moderate assistance, Standard walker General  ADL Comments: L sided weakness, balance, cognition, HOH impacting ADL performance  Cognition: Cognition Overall Cognitive Status: Impaired/Different from baseline Orientation Level: Oriented X4 Cognition Arousal/Alertness: Awake/alert Behavior During Therapy: WFL for tasks assessed/performed Overall Cognitive Status: Impaired/Different from baseline Area of Impairment: Memory, Safety/judgement, Awareness, Problem solving Memory: Decreased short-term memory Safety/Judgement: Decreased awareness of safety, Decreased awareness of deficits Awareness: Emergent Problem Solving: Requires verbal cues, Decreased initiation General Comments: Husband is very helpful and likes to spoil the pt by doing for her. Had a nice converation with him about letting her do some for herself and then assisting when pt gets fatigued.  Cues required for sequencing for transfers.  Physical Exam: Blood pressure (!) 116/54, pulse 70, temperature 98.5 F (36.9 C), temperature source Oral, resp. rate 19, height _0  (1.549 m), weight 67 kg, SpO2 93 %. Physical Exam Vitals and  nursing note reviewed.  Constitutional:      Appearance: She is obese.  HENT:     Head: Normocephalic and atraumatic.     Comments: Scab on left side of nose from oxygen mask used during transport Eyes:     Extraocular Movements: Extraocular movements intact.     Pupils: Pupils are equal, round, and reactive to light.  Cardiovascular:     Rate and Rhythm: Normal rate and regular rhythm.     Heart sounds: No murmur heard. Pulmonary:     Effort: Pulmonary effort is normal.     Breath sounds: Examination of the right-upper field reveals wheezing. Examination of the left-upper field reveals wheezing. Wheezing present. No rhonchi.  Chest:     Comments: Healed midline sternotomy Skin:    General: Skin is warm and dry.  Neurological:     Mental Status: She is alert.     Cranial Nerves: No dysarthria or facial asymmetry.     Sensory:  Sensation is intact.     Motor: Weakness present.     Coordination: Coordination abnormal.     Gait: Gait abnormal.     Comments: Oriented to person time situation but not date  Motor strength is 5/5 in the right deltoid bicep tricep grip hip flexor knee extensor ankle dorsiflexor 4/5 in the left deltoid bicep tricep hip flexor knee extensor ankle dorsiflexor Left neglect is evident but can be cued to look to the left  Psychiatric:        Mood and Affect: Mood normal.    Results for orders placed or performed during the hospital encounter of 05/01/21 (from the past 48 hour(s))  CBC with Differential/Platelet     Status: Abnormal   Collection Time: 05/06/21  3:00 AM  Result Value Ref Range   WBC 10.2 4.0 - 10.5 K/uL   RBC 3.32 (L) 3.87 - 5.11 MIL/uL   Hemoglobin 8.9 (L) 12.0 - 15.0 g/dL   HCT 29.9 (L) 36.0 - 46.0 %   MCV 90.1 80.0 - 100.0 fL   MCH 26.8 26.0 - 34.0 pg   MCHC 29.8 (L) 30.0 - 36.0 g/dL   RDW 20.2 (H) 11.5 - 15.5 %   Platelets 144 (L) 150 - 400 K/uL   nRBC 0.0 0.0 - 0.2 %   Neutrophils Relative % 69 %   Neutro Abs 7.0 1.7 - 7.7 K/uL   Lymphocytes Relative 22 %   Lymphs Abs 2.2 0.7 - 4.0 K/uL   Monocytes Relative 4 %   Monocytes Absolute 0.4 0.1 - 1.0 K/uL   Eosinophils Relative 5 %   Eosinophils Absolute 0.5 0.0 - 0.5 K/uL   Basophils Relative 0 %   Basophils Absolute 0.0 0.0 - 0.1 K/uL   WBC Morphology See Note     Comment: Mild Left Shift. 1 to 5% Metas and Myelos, Occ Pro Noted.   nRBC 0 0 /100 WBC   Abs Immature Granulocytes 0.00 0.00 - 0.07 K/uL    Comment: Performed at [facility], [place] [address] [place], [place] [address]  Basic metabolic panel     Status: Abnormal   Collection Time: 05/06/21  3:00 AM  Result Value Ref Range   Sodium 139 135 - 145 mmol/L   Potassium 3.8 3.5 - 5.1 mmol/L   Chloride 113 (H) 98 - 111 mmol/L   CO2 22 22 - 32 mmol/L   Glucose, Bld 96 70 - 99 mg/dL    Comment: Glucose reference range applies only  to samples taken  after fasting for at least 8 hours.   BUN 8 8 - 23 mg/dL   Creatinine, Ser 1.11 (H) 0.44 - 1.00 mg/dL   Calcium 7.9 (L) 8.9 - 10.3 mg/dL   GFR, Estimated 52 (L) >60 mL/min    Comment: (NOTE) Calculated using the CKD-EPI Creatinine Equation (2021)    Anion gap 4 (L) 5 - 15    Comment: Performed at [facility] [address], [place], [place] [address]   No results found.     Medical Problem List and Plan: 1. Functional deficits secondary to right MCA infarct  -patient may  shower  -ELOS/Goals: 10 to 12 days supervision to modified independent goals 2.  Antithrombotics: -DVT/anticoagulation:  Pharmaceutical: Xarelto  -antiplatelet therapy: Aspirin 81 mg/day 3. Pain Management: Tylenol, pregabalin 50 mg twice daily for chronic headaches Topamax 200 mg twice daily for chronic headaches 4. Mood: Monitor, consider neuropsych if depressed mood  -antipsychotic agents: None used at the current time   5. Neuropsych: This patient is capable of making decisions on her own behalf. 6. Skin/Wound Care: Monitor skin, turn every 2 hours 7. Fluids/Electrolytes/Nutrition: Routine I's and O's, check be met in a.m. 8.  UTI E. coli Pyelonephritis with stents for left hydronephrosis related to ureteral stone, was septic and treated with IV antibiotics, continue cephalexin 500 mg 3 times daily until 05/12/2021  9.  Constipation Colace 100 mg p.o. twice daily as needed 10.  Cognitive deficit versus pre-existing continued Aricept 10 mg nightly 11.  COPD continue guaifenesin as needed as well as Proventil as needed and Breo elliptica 200/25 MCG  1 puff daily, Singulair 10 mg daily, umeclidinium bromide 62.5 MCG 1 puff daily at 1530 12.  Pulmonary fibrosis post COVID, continue 1 to 2 L of oxygen, did not use oxygen at home prior to hospitalization #13.  Hyperlipidemia continue pravastatin 40 mg/day 14.  Dysphagia stroke continue D2 diet follow-up with speech therapy 15.  CODE STATUS is partial, no  CPR, no defibrillation    Charlett Blake, MD 05/07/2021

## 2021-05-07 NOTE — Progress Notes (Signed)
Patient seen and examined at the bedside this morning.  Hemodynamically stable.  No new complaints.  Medically stable for discharge.  No change in the medical management.  She has a bed at [facility] today.

## 2021-05-07 NOTE — Progress Notes (Signed)
Signed                                                                                                                                                                                                                                                                                                                                                                                                                                                        PMR Admission Coordinator Pre-Admission Assessment   Patient: Jenny Kelly is an 76 y.o., female MRN: [id number] DOB: Jan 11, 1946 Height: 5\' 1"  (154.9 cm) Weight: 67 kg   Insurance Information HMO: yes    PPO:      PCP:      IPA:      80/20:      OTHER:  PRIMARY: [employer]      Policy#: [id number]      Subscriber: pt CM Name: Thora Lance     Phone#: [phone] ext [phone]  Fax#: [phone] Pre-Cert#: [id number]  Received approval on 05/05/21 from Wyona Almas    Employer:  Benefits:  Phone #: [phone]     Name: 1/12 Eff. Date: 04/23/2021     Deduct: none      Out of Pocket Max: $3400      Life Max: none CIR: $295 co pay per day days 1 until 6  SNF: no co pay per day days 1 until 20; $196 co pay per day days 21 until 100 Outpatient: $10 to $20 co pay per visit     Co-Pay: visits per medical neccesity Home Health: 100%      Co-Pay: visits per medical neccesity DME: 80%     Co-Pay: 20% Providers: in network  SECONDARY: none   Financial Counselor:       Phone#:    The [employer]uary for patients in Inpatient Rehabilitation Facilities with attached Privacy Act [place] Records was provided and verbally reviewed with: Patient and Family   Emergency Contact Information Contact Information       Name Relation Home Work Mobile    [place] Spouse  [phone]   [phone]    JENESA, FORESTA     [phone]         Current Medical History  Patient Admitting Diagnosis: CVA   History of Present Illness: 76 year old female with history of a fib, CKD stage 3, Type 2 DM, hypothyroidism, HLD, CAD, memory deficits, CABG, tremor and CVA. Presented on 05/01/2021 form Outside hospital /[facility] [facility] with acute left hemiplegia. Dysuria 3 days. CTA showed acute M1 and M2 occlusion  and UA consistent was UTI. Concern for UTI/sepsis. Developed dyspnea and respiratory distress and place on BIPAP. There was a question of aspiration and she was placed on BIPAP.   CTA head and neck acute right M! Occlusion and severe stenosis of the right M2 more distally. MRI acute infarcts involving the insula, anterior temporal lobe and inferior frontal lobe consistent with known recent MCA occlusions. 2 d echo EF 60 to 65%. LDL 98 Hgb A1c 5.9/ VTE prophylaxis with SCDs. Xarelto prior to admit. Now on Heparin IV and plan to switch to Xarelto or Eliquis.    Treated for Bradycardia and hypotension treated with Dopamine but discontinued after 25 beat run VT. Atropine uses . Transitioned to  4 liters [place]. Atrial fibrillation with home meds of Amiodarone. Statin intolerance listed for muscle weakness. Will try low intensity pravastin . ON CPAP at home for sleep apnea. Pulmonary fibrosis as residual COVID in 2021. Leukocytosis on Zosyn. Ureteral stone with UTI and found to have 7 mm left ureteral stone. Cystoscopy with left ureteral stent placement and left retrograde pyelogram.    Complete NIHSS TOTAL: 1   Patient's medical record from [facility] has been reviewed by the rehabilitation admission coordinator and physician.   Past Medical History      Past Medical History:  Diagnosis Date   Allergy     Anemia     Anxiety     Arthritis     Asthma     Atrial fibrillation ([place])     Blood transfusion without reported diagnosis     Cataract     CHF (congestive  heart failure) (HCC)     Chronic kidney disease     Clotting disorder (HCC)     COPD (chronic obstructive pulmonary disease) ([place])     Depression     Diabetes mellitus without complication (HCC)     GERD (gastroesophageal reflux disease)     Heart murmur     Hyperlipidemia     Myocardial infarction ([place])     Neuromuscular disorder ([place])      tremors   Seizures ([place])     Sleep apnea     Stroke [facility])     Thyroid disease      Has the patient  had major surgery during 100 days prior to admission? Yes   Family History   Family history is unknown by patient.   Current Medications   Current Facility-Administered Medications:    0.9 %  sodium chloride infusion, 250 mL, Intravenous, Continuous, Agarwala, Ravi, MD, Stopped at 05/02/21 1516   acetaminophen (TYLENOL) tablet 650 mg, 650 mg, Oral, Q4H PRN, Agarwala, Ravi, MD, 650 mg at 05/06/21 0122   albuterol (PROVENTIL) (2.5 MG/3ML) 0.083% nebulizer solution 2.5 mg, 2.5 mg, Nebulization, Q4H PRN, Agarwala, Ravi, MD, 2.5 mg at 05/03/21 1747   aspirin EC tablet 81 mg, 81 mg, Oral, Daily, Agarwala, Ravi, MD, 81 mg at 05/06/21 0847   cephALEXin (KEFLEX) capsule 500 mg, 500 mg, Oral, Q8H, Adhikari, Amrit, MD, 500 mg at 05/07/21 0556   Chlorhexidine Gluconate Cloth 2 % PADS 6 each, 6 each, Topical, Daily, Agarwala, Ravi, MD, 6 each at 05/06/21 0847   docusate sodium (COLACE) capsule 100 mg, 100 mg, Oral, BID PRN, Agarwala, Ravi, MD   donepezil (ARICEPT) tablet 10 mg, 10 mg, Oral, QHS, Agarwala, Ravi, MD, 10 mg at 05/06/21 2238   feeding supplement (ENSURE ENLIVE / ENSURE PLUS) liquid 237 mL, 237 mL, Oral, BID BM, Agarwala, Ravi, MD, 237 mL at 05/06/21 1520   fluticasone furoate-vilanterol (BREO ELLIPTA) 200-25 MCG/ACT 1 puff, 1 puff, Inhalation, Daily, Agarwala, Ravi, MD, 1 puff at 05/07/21 0848   Gerhardt's butt cream, , Topical, BID, Kipp Brood, MD, Given at 05/06/21 2238   guaiFENesin (MUCINEX) 12 hr tablet 1,200 mg, 1,200 mg, Oral, BID PRN,  Agarwala, Ravi, MD, 1,200 mg at 05/04/21 1116   levothyroxine (SYNTHROID) tablet 88 mcg, 88 mcg, Oral, QAC breakfast, Agarwala, Ravi, MD, 88 mcg at 05/07/21 0556   loperamide (IMODIUM) capsule 2 mg, 2 mg, Oral, PRN, Kipp Brood, MD, 2 mg at 05/04/21 0937   montelukast (SINGULAIR) tablet 10 mg, 10 mg, Oral, QHS, Agarwala, Ravi, MD, 10 mg at 05/06/21 2237   phenazopyridine (PYRIDIUM) tablet 100 mg, 100 mg, Oral, PRN, Agarwala, Ravi, MD, 100 mg at 05/04/21 0601   pravastatin (PRAVACHOL) tablet 40 mg, 40 mg, Oral, q1800, Agarwala, Ravi, MD, 40 mg at 05/06/21 1853   pregabalin (LYRICA) capsule 50 mg, 50 mg, Oral, BID, Agarwala, Ravi, MD, 50 mg at 05/06/21 2238   rivaroxaban (XARELTO) tablet 20 mg, 20 mg, Oral, Daily, Adhikari, Amrit, MD, 20 mg at 05/06/21 0847   sodium chloride flush (NS) 0.9 % injection 10-40 mL, 10-40 mL, Intracatheter, Q12H, Agarwala, Ravi, MD, 10 mL at 05/06/21 0848   sodium chloride flush (NS) 0.9 % injection 10-40 mL, 10-40 mL, Intracatheter, PRN, Agarwala, Ravi, MD   topiramate (TOPAMAX) tablet 200 mg, 200 mg, Oral, BID, Agarwala, Ravi, MD, 200 mg at 05/06/21 2237   umeclidinium bromide (INCRUSE ELLIPTA) 62.5 MCG/ACT 1 puff, 1 puff, Inhalation, Daily, Agarwala, Ravi, MD, 1 puff at 05/07/21 0848   Patients Current Diet:  Diet Order                  Diet general             DIET DYS 2 Room service appropriate? Yes with Assist; Fluid consistency: Thin  Diet effective now                       Precautions / Restrictions Precautions Precautions: Fall Precaution Comments: watch O2 Restrictions Weight Bearing Restrictions: No Other Position/Activity Restrictions: swollen LUE    Has the patient had 2 or more falls or  a fall with injury in the past year? No   Prior Activity Level Community (5-7x/wk): Mod I with RW, supervision with adls   Prior Functional Level Self Care: Did the patient need help bathing, dressing, using the toilet or eating? Needed some help    Indoor Mobility: Did the patient need assistance with walking from room to room (with or without device)? Needed some help   Stairs: Did the patient need assistance with internal or external stairs (with or without device)? Needed some help   Functional Cognition: Did the patient need help planning regular tasks such as shopping or remembering to take medications? Needed some help   Patient Information Are you of Hispanic, Latino/a,or Spanish origin?: A. No, not of Hispanic, Latino/a, or Spanish origin What is your race?: A. White Do you need or want an interpreter to communicate with a doctor or health care staff?: 0. No   Patient's Response To:  Health Literacy and Transportation Is the patient able to respond to health literacy and transportation needs?: Yes Health Literacy - How often do you need to have someone help you when you read instructions, pamphlets, or other written material from your doctor or pharmacy?: Never In the past 12 months, has lack of transportation kept you from medical appointments or from getting medications?: No In the past 12 months, has lack of transportation kept you from meetings, work, or from getting things needed for daily living?: No   Home Assistive Devices / Equipment Home Equipment: Conservation officer, nature (2 wheels), BSC/3in1, Shower seat   Prior Device Use: Indicate devices/aids used by the patient prior to current illness, exacerbation or injury? Walker   Current Functional Level Cognition   Overall Cognitive Status: Impaired/Different from baseline Orientation Level: Oriented X4 Safety/Judgement: Decreased awareness of safety, Decreased awareness of deficits General Comments: Husband is very helpful and likes to spoil the pt by doing for her. Had a nice converation with him about letting her do some for herself and then assisting when pt gets fatigued.  Cues required for sequencing for transfers.    Extremity Assessment (includes  Sensation/Coordination)   Upper Extremity Assessment: LUE deficits/detail LUE Deficits / Details: distal stronger than proximal overall 4/5, fine motor coordination deficits present, FF to approx 45degrees LUE: Shoulder pain with ROM LUE Sensation: WNL LUE Coordination: decreased fine motor, decreased gross motor  Lower Extremity Assessment: Defer to PT evaluation RLE Deficits / Details: grossly 4-/5 LLE Deficits / Details: grossly 4-/5 but with mild coordination deficits LLE Sensation: WNL LLE Coordination: decreased fine motor, decreased gross motor     ADLs   Overall ADL's : Needs assistance/impaired Eating/Feeding: Set up, Sitting Eating/Feeding Details (indicate cue type and reason): Pt found in bed with husband feeding her.  After pt moved to chair, pt able to finish feeding herself. Grooming: Wash/dry hands, Wash/dry face, Brushing hair, Set up, Sitting Grooming Details (indicate cue type and reason): sitting EOB for appx 4 min to do simple grooming Upper Body Bathing: Moderate assistance, Sitting Upper Body Bathing Details (indicate cue type and reason): husband assists her at baseline Lower Body Bathing: Moderate assistance, Sitting/lateral leans Lower Body Bathing Details (indicate cue type and reason): Pt is able to perform figure 4 with BLE Upper Body Dressing : Minimal assistance, Sitting Lower Body Dressing: Minimal assistance, Sitting/lateral leans Lower Body Dressing Details (indicate cue type and reason): will likely need more assist for cloting that requires sit<>stand Toilet Transfer: Moderate assistance, Stand-pivot Toilet Transfer Details (indicate cue type and reason):  Pt required assist x1 today and transfers to strong side better than weak side. Pt require cues to make sure chair was behind her and cues to reach back for the chair. Toileting- Clothing Manipulation and Hygiene: Moderate assistance, Sit to/from stand Toileting - Clothing Manipulation Details  (indicate cue type and reason): assist with cleaning self due to poor standing balance. Pt held to walker while second person cleaned peri area due to inability to let go of walker to clean self. Tub/ Shower Transfer: Moderate assistance, Ambulation, Shower seat Functional mobility during ADLs: Moderate assistance, Standard walker General ADL Comments: L sided weakness, balance, cognition, HOH impacting ADL performance     Mobility   Overal bed mobility: Needs Assistance Bed Mobility: Supine to Sit, Rolling Rolling: Supervision Supine to sit: Min assist, HOB elevated (used bed rails) General bed mobility comments: min A for trunk elevation.     Transfers   Overall transfer level: Needs assistance Equipment used: 1 person hand held assist Transfers: Sit to/from Stand, Bed to chair/wheelchair/BSC Sit to Stand: Mod assist Bed to/from chair/wheelchair/BSC transfer type:: Stand pivot Stand pivot transfers: Min assist Step pivot transfers: Mod assist, +2 safety/equipment General transfer comment: Light mod A to stand and min A to pivot to recliner chair. Face to face with HHA for transfers     Ambulation / Gait / Stairs / Wheelchair Mobility         Posture / Balance Dynamic Sitting Balance Sitting balance - Comments: close min guard Balance Overall balance assessment: Needs assistance Sitting-balance support: Feet supported, Bilateral upper extremity supported Sitting balance-Leahy Scale: Fair Sitting balance - Comments: close min guard Postural control: Posterior lean Standing balance support: Bilateral upper extremity supported, Single extremity supported Standing balance-Leahy Scale: Poor Standing balance comment: dependent on external support     Special needs/care consideration      Previous Home Environment  Living Arrangements: Spouse/significant other  Lives With: Spouse Available Help at Discharge: Family, Available 24 hours/day Type of Home: House Home Layout: One  level Home Access: Level entry Bathroom Shower/Tub: Tub/shower unit, Door [employer] Toilet: Standard Bathroom Accessibility: Yes How Accessible: Accessible via walker [place]: No   Discharge Living Setting Plans for Discharge Living Setting: Patient's home, House, Lives with (comment) (spouse) Type of Home at Discharge: House Discharge Home Layout: One level Discharge Home Access: Level entry Discharge Bathroom Shower/Tub: Tub/shower unit Discharge Bathroom Toilet: Standard Discharge Bathroom Accessibility: Yes How Accessible: Accessible via walker Does the patient have any problems obtaining your medications?: No   Social/Family/Support Systems Patient Roles: Spouse Contact Information: [name] Anticipated Caregiver: spouse Anticipated Ambulance person Information: see contacts Ability/Limitations of Caregiver: none Caregiver Availability: 24/7 Discharge Plan Discussed with Primary Caregiver: Yes Is Caregiver In Agreement with Plan?: Yes Does Caregiver/Family have Issues with Lodging/Transportation while Pt is in Rehab?: No   Goals Patient/Family Goal for Rehab: Mod I to supervision with PT and OT Expected length of stay: ELOS 10 to 12 days Pt/Family Agrees to Admission and willing to participate: Yes Program Orientation Provided & Reviewed with Pt/Caregiver Including Roles  & Responsibilities: Yes   Decrease burden of Care through IP rehab admission: n/a   Possible need for SNF placement upon discharge: not anticipated   Patient Condition: I have reviewed medical records from [facility] , spoken with CM, and patient and spouse. I met with patient at the bedside for inpatient rehabilitation assessment.  Patient will benefit from ongoing PT, OT, and SLP, can actively participate in 3 hours  of therapy a day 5 days of the week, and can make measurable gains during the admission.  Patient will also benefit from the coordinated team approach during an Inpatient  Acute Rehabilitation admission.  The patient will receive intensive therapy as well as Rehabilitation physician, nursing, social worker, and care management interventions.  Due to bladder management, bowel management, safety, skin/wound care, disease management, medication administration, pain management, and patient education the patient requires 24 hour a day rehabilitation nursing.  The patient is currently min-mod assist overall with mobility and mod assist with basic ADLs.  Discharge setting and therapy post discharge at home with home health is anticipated.  Patient has agreed to participate in the Acute Inpatient Rehabilitation Program and will admit today.   Preadmission Screen Completed By: Danne Baxter RN MSN with updates by Gayland Curry, 05/07/2021 9:23 AM ______________________________________________________________________   Discussed status with Dr. Letta Pate on 05/07/21  at 9:23 AM and received approval for admission today.   Admission Coordinator: Danne Baxter RN MSN with updates by Gayland Curry, MS, CCC-SLP, time 9:23 AM/Date 05/07/21     Assessment/Plan: Diagnosis:R MCA infarct Does the need for close, 24 hr/day Medical supervision in concert with the patient's rehab needs make it unreasonable for this patient to be served in a less intensive setting? Yes Co-Morbidities requiring supervision/potential complications: Type 2 DM, Afib on anticoagulation, pulmonary fibrosis Due to bladder management, bowel management, safety, skin/wound care, disease management, medication administration, pain management, and patient education, does the patient require 24 hr/day rehab nursing? Yes Does the patient require coordinated care of a physician, rehab nurse, PT, OT, and SLP to address physical and functional deficits in the context of the above medical diagnosis(es)? Yes Addressing deficits in the following areas: balance, endurance, locomotion, strength, transferring,  bowel/bladder control, bathing, dressing, feeding, grooming, toileting, cognition, swallowing, and psychosocial support Can the patient actively participate in an intensive therapy program of at least 3 hrs of therapy 5 days a week? Yes The potential for patient to make measurable gains while on inpatient rehab is good Anticipated functional outcomes upon discharge from inpatient rehab: modified independent and supervision PT, modified independent and supervision OT, modified independent SLP Estimated rehab length of stay to reach the above functional goals is: 10-12d Anticipated discharge destination: Home 10. Overall Rehab/Functional Prognosis: good     MD Signature: Charlett Blake M.D. [facility] Fellow Am Acad of Phys Med and Rehab Diplomate Am Board of Electrodiagnostic Med Fellow Am Board of Interventional Pain

## 2021-05-07 NOTE — Progress Notes (Signed)
Inpatient Rehab Admissions Coordinator:  There is a bed in CIR for pt to admit today. Dr. Renford Dills is aware and in agreement. Pt/husband, TOC, and NSG also aware. NSG can call report to 4W nurses desk at [phone] after 12pm.  Wolfgang Phoenix, MS, CCC-SLP Admissions Coordinator [phone]

## 2021-05-07 NOTE — Progress Notes (Signed)
Patient ID: Jenny Kelly, female   DOB: Aug 16, 1945, 76 y.o.   MRN: [id number]  INPATIENT REHABILITATION ADMISSION NOTE   Arrival Method: Bed     Mental Orientation: A&O x4 Hard of hearing   Assessment: completed   Skin: MASD to bottom present on admission   IV'S: double lumen PICC and Peripheral IV to Right arm    Pain: none    Tubes and Drains: ureteral stent with no drain    Safety Measures: 4 side rails up Bed alarm on    Vital Signs: completed   Height and Weight: 5'2" 66.6 kg    Rehab Orientation: completed   Family: Husband at bedside on admission son at bedside currently    Notes:

## 2021-05-07 NOTE — Progress Notes (Signed)
UA resulted for Large hemoglobin and Moderate Leukocytes Wbc 11-20 on Microscopic. Patient on Keflex 500mg  TID

## 2021-05-07 NOTE — Progress Notes (Signed)
Inpatient Rehabilitation Admission Medication Review by a Pharmacist  A complete drug regimen review was completed for this patient to identify any potential clinically significant medication issues.  High Risk Drug Classes Is patient taking? Indication by Medication  Antipsychotic No   Anticoagulant Yes Xarelto for Afib  Antibiotic Yes PO Keflex to complete course for UTI  Opioid No   Antiplatelet No   Hypoglycemics/insulin No   Vasoactive Medication No   Chemotherapy No   Other Yes Astelin, Flonase, Atrovent NS, Anoro, Singulair, Pulmicort nebs for COPD Aricept for dementia Protonix and Pepcid for Gerd Topamax, Lyrica for HA Pravastatin for HLD Synthroid for low thyroid     Type of Medication Issue Identified Description of Issue Recommendation(s)  Drug Interaction(s) (clinically significant)     Duplicate Therapy     Allergy     No Medication Administration End Date     Incorrect Dose     Additional Drug Therapy Needed     Significant med changes from prior encounter (inform family/care partners about these prior to discharge).    Other       Clinically significant medication issues were identified that warrant physician communication and completion of prescribed/recommended actions by midnight of the next day:  No  Pharmacist comments: Xarelto and Pepcid adjusted for renal dysfunction, Do both Pepcid and Protonix need to continue?  Time spent performing this drug regimen review (minutes):  20 minutes   Elwin Sleight 05/07/2021 2:38 PM

## 2021-05-08 DIAGNOSIS — I63511 Cerebral infarction due to unspecified occlusion or stenosis of right middle cerebral artery: Secondary | ICD-10-CM | POA: Diagnosis not present

## 2021-05-08 DIAGNOSIS — R7881 Bacteremia: Secondary | ICD-10-CM

## 2021-05-08 DIAGNOSIS — J8410 Pulmonary fibrosis, unspecified: Secondary | ICD-10-CM

## 2021-05-08 DIAGNOSIS — Z960 Presence of urogenital implants: Secondary | ICD-10-CM

## 2021-05-08 MED ORDER — SODIUM CHLORIDE 0.9% FLUSH
10.0000 mL | Freq: Two times a day (BID) | INTRAVENOUS | Status: DC
  Administered 2021-05-08 – 2021-05-15 (×12): 10 mL

## 2021-05-08 MED ORDER — RISAQUAD PO CAPS
2.0000 | ORAL_CAPSULE | Freq: Three times a day (TID) | ORAL | Status: DC
  Administered 2021-05-08 – 2021-05-12 (×12): 2 via ORAL
  Filled 2021-05-08 (×12): qty 2

## 2021-05-08 MED ORDER — ORAL CARE MOUTH RINSE
15.0000 mL | Freq: Two times a day (BID) | OROMUCOSAL | Status: DC
  Administered 2021-05-08 – 2021-05-15 (×4): 15 mL via OROMUCOSAL

## 2021-05-08 MED ORDER — SODIUM CHLORIDE 0.9% FLUSH
10.0000 mL | INTRAVENOUS | Status: DC | PRN
  Administered 2021-05-08: 20 mL

## 2021-05-08 MED ORDER — CHLORHEXIDINE GLUCONATE 0.12 % MT SOLN
15.0000 mL | Freq: Two times a day (BID) | OROMUCOSAL | Status: DC
  Administered 2021-05-08 – 2021-05-16 (×11): 15 mL via OROMUCOSAL
  Filled 2021-05-08 (×15): qty 15

## 2021-05-08 MED ORDER — CHLORHEXIDINE GLUCONATE CLOTH 2 % EX PADS
6.0000 | MEDICATED_PAD | Freq: Every day | CUTANEOUS | Status: DC
  Administered 2021-05-08 – 2021-05-16 (×9): 6 via TOPICAL

## 2021-05-08 MED ORDER — MAGNESIUM GLUCONATE 500 MG PO TABS
250.0000 mg | ORAL_TABLET | Freq: Every day | ORAL | Status: DC
  Administered 2021-05-08: 250 mg via ORAL
  Filled 2021-05-08: qty 1

## 2021-05-08 NOTE — Evaluation (Signed)
Speech Language Pathology Assessment and Plan  Patient Details  Name: Jenny Kelly MRN: [id number] Date of Birth: 1945/05/28  SLP Diagnosis: N/A Rehab Potential: N/A ELOS: N/A   [name]: 05/08/2021 SLP Individual Time: 0805-0900 SLP Individual Time Calculation (min): 24 min   Hospital Problem: Principal Problem:   Right middle cerebral artery stroke ([place]) Active Problems:   Cerebral infarction due to thrombosis of right middle cerebral artery ([place])  Past Medical History:  Past Medical History:  Diagnosis Date   Allergy    Anemia    Anxiety    Arthritis    Asthma    Atrial fibrillation ([place])    Blood transfusion without reported diagnosis    Cataract    CHF (congestive heart failure) ([place])    Chronic kidney disease    Clotting disorder (HCC)    COPD (chronic obstructive pulmonary disease) ([place])    Depression    Diabetes mellitus without complication (HCC)    GERD (gastroesophageal reflux disease)    Heart murmur    Hyperlipidemia    Myocardial infarction ([place])    Neuromuscular disorder ([place])    tremors   Seizures ([place])    Sleep apnea    Stroke ([place])    Thyroid disease    Past Surgical History:  Past Surgical History:  Procedure Laterality Date   ABDOMINAL HYSTERECTOMY     APPENDECTOMY     bladder tack     CHOLECYSTECTOMY     CORONARY ARTERY BYPASS GRAFT     CYSTOSCOPY W/ URETERAL STENT PLACEMENT Left 05/01/2021   Procedure: CYSTOSCOPY WITH RETROGRADE PYELOGRAM/URETERAL STENT PLACEMENT;  Surgeon: Ceasar Mons, MD;  Location: [place] [facility];  Service: Urology;  Laterality: Left;   ESOPHAGOGASTRODUODENOSCOPY ENDOSCOPY     EYE SURGERY     FRACTURE SURGERY Right    3rd finger   JOINT REPLACEMENT Bilateral    knees   TONSILLECTOMY AND ADENOIDECTOMY     TUBAL LIGATION      Assessment / Plan / Recommendation Clinical Impression Patient is a 76 year old female with history of a fib, CKD stage 3, Type 2 DM, hypothyroidism, HLD, CAD, memory deficits,  CABG, tremor and CVA. Presented on 05/01/2021 with acute left hemiplegia and dysuria for 3 days. CTA showed acute M1 and M2 occlusion  and UA consistent with UTI. Concern for UTI/sepsis. Developed dyspnea and respiratory distress and place on BIPAP.   CTA head and neck acute right M1 Occlusion and severe stenosis of the right M2 more distally. MRI acute infarcts involving the insula, anterior temporal lobe and inferior frontal lobe consistent with known recent MCA occlusions.  Therapy evaluations complete with recommendations for CIR. Patient admitted 05/07/20.  Patient administered a BSE with no signs of acute dysphagia. Patient consumed thin liquids via straw without overt s/s of aspiration. Prolonged mastication with trials of Dys. 2 textures and Dys. 3 textures noted due to missing dentition. Patient's husband present and describes patient's baseline diet at home which appears most consistent with her current Dys. 2 diet.  Provided education to the patient and her husband regarding foods and consistencies with upgraded Dys. 3 diet and both reported they feel that diet would be too difficult to masticate impacting her overall safety and PO intake. All in agreement to continue current diet of Dys. 2 textures with thin liquids with a preference sheet placed outside her door. No f/u is needed for dysphagia.   SLP also administered an informal cognitive-linguistic evaluation. Patient appears moderately hard of hearing  which impacts her auditory comprehension at times. Patient's husband reports hearing difficulty is not baseline and has only occurred since admission. Patient able to follow basic commands and express wants/needs at the sentence level without deficits in word-finding or intelligibility. Patient's husband reports that he provides assistance with ADLs at home and takes care of all iADLs. He also reports that patient is oriented to month and year at baseline but not to date since retirement and that  patient is mostly sedentary at home.  Both the patient and her husband report that the patient is at her baseline level of cognitive-linguistic functioning, therefore, skilled SLP intervention is not warranted at this time.    Skilled Therapeutic Interventions          Administered a BSE and cognitive-linguistic evaluation, please see above for details.   SLP Assessment  Patient does not need any further Speech Lanaguage Pathology Services    Recommendations  SLP Diet Recommendations: Dysphagia 2 (Fine chop);Thin Liquid Administration via: Cup;Straw Medication Administration: Whole meds with puree Supervision: Patient able to self feed;Full supervision/cueing for compensatory strategies Compensations: Slow rate;Small sips/bites;Minimize environmental distractions Postural Changes and/or Swallow Maneuvers: Seated upright 90 degrees Oral Care Recommendations: Oral care BID Patient destination: Home Follow up Recommendations: None;24 hour supervision/assistance Equipment Recommended: None recommended by SLP    SLP Frequency N/A  SLP Duration  SLP Intensity  SLP Treatment/Interventions N/A  N/A  N/A   Pain Pain Assessment Pain Scale: Faces Faces Pain Scale: Hurts a little bit Pain Type: Acute pain Pain Location: Arm (PIC line) Pain Orientation: Right Pain Descriptors / Indicators: Sore Pain Onset: Other (Comment) (With UB dressing)  Prior Functioning Type of Home: House  Lives With: Spouse;Other (Comment) (73 y.o. grandson (senior in [employer])) Available Help at Discharge: Family;Available 24 hours/day  SLP Evaluation Cognition Overall Cognitive Status: History of cognitive impairments - at baseline Arousal/Alertness: Awake/alert Orientation Level: Oriented X4 Year: 2023 Month: January Day of Week: Incorrect Memory: Impaired Memory Impairment: Decreased recall of new information;Decreased short term memory;Retrieval deficit Immediate Memory Recall: Sock;Blue;Bed Memory  Recall Sock: Without Cue Memory Recall Blue: Without Cue Memory Recall Bed: Without Cue Awareness: Impaired Problem Solving: Impaired Safety/Judgment: Impaired Comments: Cues for safety  Comprehension Auditory Comprehension Overall Auditory Comprehension: Impaired Yes/No Questions: Within Functional Limits Commands: Within Functional Limits Conversation: Simple Interfering Components: Hearing Expression Expression Primary Mode of Expression: Verbal Verbal Expression Overall Verbal Expression: Appears within functional limits for tasks assessed Written Expression Dominant Hand: Right Written Expression: Not tested Oral Motor Oral Motor/Sensory Function Overall Oral Motor/Sensory Function: Mild impairment Facial ROM: Within Functional Limits Facial Symmetry: Abnormal symmetry left;Suspected CN VII (facial) dysfunction Facial Strength: Within Functional Limits Facial Sensation: Within Functional Limits Lingual ROM: Within Functional Limits Lingual Symmetry: Within Functional Limits Lingual Strength: Within Functional Limits Lingual Sensation: Within Functional Limits Velum: Within Functional Limits Motor Speech Overall Motor Speech: Appears within functional limits for tasks assessed  Care Tool Care Tool Cognition Ability to hear (with hearing aid or hearing appliances if normally used Ability to hear (with hearing aid or hearing appliances if normally used): 2. Moderate difficulty - speaker has to increase volume and speak distinctly   Expression of Ideas and Wants Expression of Ideas and Wants: 4. Without difficulty (complex and basic) - expresses complex messages without difficulty and with speech that is clear and easy to understand   Understanding Verbal and Non-Verbal Content Understanding Verbal and Non-Verbal Content: 3. Usually understands - understands most conversations, but misses some part/intent of  message. Requires cues at times to understand  Memory/Recall  Ability Memory/Recall Ability : That he or she is in a hospital/hospital unit;Current season   Bedside Swallowing Assessment General Date of Onset: 05/01/21 Previous Swallow Assessment: BSE on acute, Recommended Dys. 2 textures with thin liquids Diet Prior to this Study: Dysphagia 2 (chopped);Thin liquids Temperature Spikes Noted: No Respiratory Status: Room air History of Recent Intubation: No Behavior/Cognition: Alert;Cooperative Oral Cavity - Dentition: Poor condition;Missing dentition Self-Feeding Abilities: Able to feed self Patient Positioning: Upright in chair/Tumbleform Baseline Vocal Quality: Normal Volitional Cough: Strong Volitional Swallow: Able to elicit  Ice Chips Ice chips: Not tested Thin Liquid Thin Liquid: Within functional limits Presentation: Straw Nectar Thick Nectar Thick Liquid: Not tested Honey Thick Honey Thick Liquid: Not tested Puree Puree: Within functional limits Presentation: Self Fed;Spoon Solid Solid: Impaired Presentation: Self Fed Oral Phase Impairments: Impaired mastication BSE Assessment Risk for Aspiration Impact on safety and function: Mild aspiration risk  Short Term Goals: N/A   Refer to Care Plan for Long Term Goals  Recommendations for other services: None   Discharge Criteria: Patient will be discharged from SLP if patient refuses treatment 3 consecutive times without medical reason, if treatment goals not met, if there is a change in medical status, if patient makes no progress towards goals or if patient is discharged from hospital.  The above assessment, treatment plan, treatment alternatives and goals were discussed and mutually agreed upon: by patient and by family  Aristotelis Vilardi 05/08/2021, 10:57 AM

## 2021-05-08 NOTE — Progress Notes (Signed)
Inpatient Rehabilitation Care Coordinator Assessment and Plan Patient Details  Name: Jenny Kelly MRN: [id number] Date of Birth: [birth year]  [name]: 05/08/2021  Hospital Problems: Principal Problem:   Right middle cerebral artery stroke [facility]) Active Problems:   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)  Past Medical History:  Past Medical History:  Diagnosis Date   Allergy    Anemia    Anxiety    Arthritis    Asthma    Atrial fibrillation (HCC)    Blood transfusion without reported diagnosis    Cataract    CHF (congestive heart failure) (HCC)    Chronic kidney disease    Clotting disorder (HCC)    COPD (chronic obstructive pulmonary disease) (HCC)    Depression    Diabetes mellitus without complication (HCC)    GERD (gastroesophageal reflux disease)    Heart murmur    Hyperlipidemia    Myocardial infarction (HCC)    Neuromuscular disorder (HCC)    tremors   Seizures (HCC)    Sleep apnea    Stroke (HCC)    Thyroid disease    Past Surgical History:  Past Surgical History:  Procedure Laterality Date   ABDOMINAL HYSTERECTOMY     APPENDECTOMY     bladder tack     CHOLECYSTECTOMY     CORONARY ARTERY BYPASS GRAFT     CYSTOSCOPY W/ URETERAL STENT PLACEMENT Left 05/01/2021   Procedure: CYSTOSCOPY WITH RETROGRADE PYELOGRAM/URETERAL STENT PLACEMENT;  Surgeon: Rene Paci, MD;  Location: [facility] OR;  Service: Urology;  Laterality: Left;   ESOPHAGOGASTRODUODENOSCOPY ENDOSCOPY     EYE SURGERY     FRACTURE SURGERY Right    3rd finger   JOINT REPLACEMENT Bilateral    knees   TONSILLECTOMY AND ADENOIDECTOMY     TUBAL LIGATION     Social History:  reports that she quit smoking about 20 years ago. Her smoking use included cigarettes. She has a 15.00 pack-year smoking history. She has never used smokeless tobacco. She reports that she does not currently use alcohol. She reports that she does not use drugs.  Family / Support  Systems Marital Status: Married Patient Roles: Spouse, Parent Spouse/Significant Other: Fayrene Fearing [phone] Children: Jimmy-son [phone]  has two other grown children-all involved Other Supports: Friends and church members Anticipated Caregiver: James-husband Ability/Limitations of Caregiver: none- in good health Caregiver Availability: 24/7 Family Dynamics: Close knit family husand has been assisting pt prior to admission. They have three grown children who are involved and will assist if needed.  Social History Preferred language: English Religion: Baptist Cultural Background: No issues Education: HS Health Literacy - How often do you need to have someone help you when you read instructions, pamphlets, or other written material from your doctor or pharmacy?: Sometimes (Due to pt is HOH) Writes: Yes Employment Status: Retired Marine scientist Issues: No issues Guardian/Conservator: None-according to MD pt is capable of making her own decisions while here. Husband plans to be here daily and provide support and son has been coming in the evenings   Abuse/Neglect Abuse/Neglect Assessment Can Be Completed: Yes Physical Abuse: Denies Verbal Abuse: Denies Sexual Abuse: Denies Exploitation of patient/patient's resources: Denies Self-Neglect: Denies  Patient response to: Social Isolation - How often do you feel lonely or isolated from those around you?: Never  Emotional Status Pt's affect, behavior and adjustment status: Pt is motivated to do well and recover from this stroke. She had one in 2009 and did well, but when she  got COVID in 2021 it was hard on her and she is still recovering from this and the pneumonia she has twice since then. Recent Psychosocial Issues: other health issues and very HOH Psychiatric History: No history/issues seems to be coping appropriatley and likes when husband is here. Will continue to monitor her coping and see if would benefit from seeing  neruo-psych while here Substance Abuse History: No issues  Patient / Family Perceptions, Expectations & Goals Pt/Family understanding of illness & functional limitations: Pt and husband can explain her stroke but are encouraged due to her improvement since this happened. Both talk with the MD's involved and feel have a good understanding of her treatment plan moving forward. Pt owild like husband present due to her hearing issues when information is shared with her Premorbid pt/family roles/activities: Wife, Mom, grandmother, retiree, church member, etc Anticipated changes in roles/activities/participation: resume Pt/family expectations/goals: Pt states: ": I hope to do well and be home soon."  Husband states: " I will assist her I have in the past and will always."  [employer]: None Premorbid Home Care/DME Agencies: Other (Comment) (has rollator, rw, bsc, cane and tub seat from past hospitalizations) Transportation available at discharge: Husband Is the patient able to respond to transportation needs?: Yes In the past 12 months, has lack of transportation kept you from medical appointments or from getting medications?: No In the past 12 months, has lack of transportation kept you from meetings, work, or from getting things needed for daily living?: No  Discharge Planning Living Arrangements: Spouse/significant other Support Systems: Spouse/significant other, Children, Other relatives, Friends/neighbors, Church/faith community Type of Residence: Private residence Insurance Resources: Media planner (specify) ([employer] Medicare) Financial Resources: Social Security, Family Support Financial Screen Referred: No Living Expenses: Own Money Management: Spouse Does the patient have any problems obtaining your medications?: No Home Management: Pt does what she can but mainly husband does the home management Patient/Family Preliminary Plans: Return home with husband  who is able to provide 24/7 care at discharge. He has helped her with her care with past hospitalizations. He plans to visit daily and be involved in her care while here. Aware team conference is on Wed will update then Care Coordinator Anticipated Follow Up Needs: HH/OP  Clinical Impression Pleasant but HOH patient who's husband is present and at times answers for her. Husband is in good health and will assist her at discharge. Will await team's evaluations and work on discharge needs. Aware team conference is Wednesday  Lucy Chris 05/08/2021, 9:57 AM

## 2021-05-08 NOTE — Progress Notes (Signed)
PROGRESS NOTE   Subjective/Complaints: Cr 1.11, improving, placed nursing order to encourage 6-8 glasses of water per day Hgb 8.9, stable Sleepy  ROS: Unable to obtain due to somnolence  Objective:   No results found. Recent Labs    05/06/21 0300  WBC 10.2  HGB 8.9*  HCT 29.9*  PLT 144*   Recent Labs    05/06/21 0300  NA 139  K 3.8  CL 113*  CO2 22  GLUCOSE 96  BUN 8  CREATININE 1.11*  CALCIUM 7.9*    Intake/Output Summary (Last 24 hours) at 05/08/2021 1226 Last data filed at 05/08/2021 0951 Gross per 24 hour  Intake 260 ml  Output --  Net 260 ml        Physical Exam: Vital Signs Blood pressure 114/62, pulse 79, temperature 98.7 F (37.1 C), temperature source Oral, resp. rate 16, height _0  (1.575 m), weight 66.6 kg, SpO2 100 %. Gen: no distress, normal appearing HEENT: oral mucosa pink and moist, NCAT Cardio: Reg rate Chest: normal effort, normal rate of breathing Abd: soft, non-distended Ext: no edema Psych: pleasant, normal affect Skin: intact Neurological:     Mental Status: She is alert.     Cranial Nerves: No dysarthria or facial asymmetry.     Sensory: Sensation is intact.     Motor: Weakness present.     Coordination: Coordination abnormal.     Gait: Gait abnormal.     Comments: Oriented to person time situation but not date   Motor strength is 5/5 in the right deltoid bicep tricep grip hip flexor knee extensor ankle dorsiflexor 4/5 in the left deltoid bicep tricep hip flexor knee extensor ankle dorsiflexor Left neglect is evident but can be cued to look to the left  Psychiatric:        Mood and Affect: Mood normal.    Assessment/Plan: 1. Functional deficits which require 3+ hours per day of interdisciplinary therapy in a comprehensive inpatient rehab setting. Physiatrist is providing close team supervision and 24 hour management of active medical problems listed  below. Physiatrist and rehab team continue to assess barriers to discharge/monitor patient progress toward functional and medical goals  Care Tool:  Bathing  Bathing activity did not occur: Safety/medical concerns Body parts bathed by patient: Right arm, Left arm, Chest, Abdomen, Front perineal area, Right upper leg, Left upper leg, Face, Right lower leg, Left lower leg   Body parts bathed by helper: Buttocks     Bathing assist Assist Level: Minimal Assistance - Patient > 75%     Upper Body Dressing/Undressing Upper body dressing Upper body dressing/undressing activity did not occur (including orthotics): Safety/medical concerns What is the patient wearing?: Pull over shirt    Upper body assist Assist Level: Minimal Assistance - Patient > 75%    Lower Body Dressing/Undressing Lower body dressing      What is the patient wearing?: Pants, Underwear/pull up     Lower body assist Assist for lower body dressing: Minimal Assistance - Patient > 75%     Toileting Toileting    Toileting assist Assist for toileting: Minimal Assistance - Patient > 75%     Transfers Chair/bed transfer  Transfers assist     Chair/bed transfer assist level: Contact Guard/Touching assist     Locomotion Ambulation   Ambulation assist              Walk 10 feet activity   Assist           Walk 50 feet activity   Assist           Walk 150 feet activity   Assist           Walk 10 feet on uneven surface  activity   Assist           Wheelchair     Assist               Wheelchair 50 feet with 2 turns activity    Assist            Wheelchair 150 feet activity     Assist          Blood pressure 114/62, pulse 79, temperature 98.7 F (37.1 C), temperature source Oral, resp. rate 16, height _0  (1.575 m), weight 66.6 kg, SpO2 100 %.    Medical Problem List and Plan: 1. Functional deficits secondary to right MCA infarct              -patient may  shower             -ELOS/Goals: 10 to 12 days supervision to modified independent goals  Admit to CIR  Messaged April to scheduled hospital follow-up with me.  2.  Antithrombotics: -DVT/anticoagulation:  Pharmaceutical: Xarelto             -antiplatelet therapy: Aspirin 81 mg/day 3. Chronic headaches: Tylenol, continue pregabalin 50 mg twice daily, Topamax 200 mg twice daily 4. Mood: Monitor, consider neuropsych if depressed mood             -antipsychotic agents: None used at the current time   5. Neuropsych: This patient is capable of making decisions on her own behalf. 6. Skin/Wound Care: Monitor skin, turn every 2 hours 7. Fluids/Electrolytes/Nutrition: Routine I's and O's, check be met in a.m. 8.  UTI E. coli Pyelonephritis with stents for left hydronephrosis related to ureteral stone, was septic and treated with IV antibiotics, continue cephalexin 500 mg 3 times daily until 05/12/2021. Start probiotic. 9.  Constipation Colace 100 mg p.o. twice daily as needed. Start magnesium 238m HS 10.  Cognitive deficit versus pre-existing continued Aricept 10 mg nightly 11.  COPD continue guaifenesin as needed as well as Proventil as needed and Breo elliptica 200/25 MCG  1 puff daily, Singulair 10 mg daily, umeclidinium bromide 62.5 MCG 1 puff daily at 1530 12.  Pulmonary fibrosis post COVID, continue 1 to 2 L of oxygen, did not use oxygen at home prior to hospitalization #13.  Hyperlipidemia continue pravastatin 40 mg/day 14.  Dysphagia stroke continue D2 diet follow-up with speech therapy 15.  CODE STATUS is partial, no CPR, no defibrillation    LOS: 1 days A FACE TO FACE EVALUATION WAS PERFORMED  KClide DeutscherRaulkar 05/08/2021, 12:26 PM

## 2021-05-08 NOTE — Progress Notes (Signed)
Patient ID: Jenny Kelly, female   DOB: Mar 24, 1946, 76 y.o.   MRN: [id number] Met with the patient and spouse to review rehab process, team conference and plan of care. Discussed medications and treatment for secondary risks, including BP (hx of low BP), DM, (A1C 5.9) HLD (LDL 98) hx of statin intolerance currently on prevachol without complications.  HF and COPD. Reviewed dietary modifications and recommendations for increased protein and calcium along with enc fluids and tips for prevention of kidney stones/UTIs. Patient noted not taking nasal sprays or inhalers since COVID however able to take Brio/Anora today with nasal sprays without difficulty. Assisted spouse to set up My Chart account for updates. Continue to follow along to discharge to address educational needs and facilitate preparation for discharge . Margarito Liner

## 2021-05-08 NOTE — Progress Notes (Signed)
[facility] Individual Statement of Services  Patient Name:  Jenny Kelly  Date:  05/08/2021  Welcome to the [facility].  Our goal is to provide you with an individualized program based on your diagnosis and situation, designed to meet your specific needs.  With this comprehensive rehabilitation program, you will be expected to participate in at least 3 hours of rehabilitation therapies Monday-Friday, with modified therapy programming on the weekends.  Your rehabilitation program will include the following services:  Physical Therapy (PT), Occupational Therapy (OT), Speech Therapy (ST), 24 hour per day rehabilitation nursing, Care Coordinator, Rehabilitation Medicine, Nutrition Services, and Pharmacy Services  Weekly team conferences will be held on Wednesday to discuss your progress.  Your Inpatient Rehabilitation Care Coordinator will talk with you frequently to get your input and to update you on team discussions.  Team conferences with you and your family in attendance may also be held.  Expected length of stay: 7 Days  Overall anticipated outcome: Supervision level  Depending on your progress and recovery, your program may change. Your Inpatient Rehabilitation Care Coordinator will coordinate services and will keep you informed of any changes. Your Inpatient Rehabilitation Care Coordinator's name and contact numbers are listed  below.  The following services may also be recommended but are not provided by the [facility]:   [place] will be made to provide these services after discharge if needed.  Arrangements include referral to agencies that provide these services.  Your insurance has been verified to be:  [employer] Your primary doctor is:  Vassie Loll Eyk  Pertinent information will be shared with your doctor and your insurance  company.  Inpatient Rehabilitation Care Coordinator:  Ovidio Kin, [place] or Emilia Beck  Information discussed with and copy given to patient by: Elease Hashimoto, 05/08/2021, 9:59 AM

## 2021-05-08 NOTE — Evaluation (Signed)
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 ([place])   Past Medical History:  Past Medical History:  Diagnosis Date   Allergy    Anemia    Anxiety    Arthritis    Asthma    Atrial fibrillation ([place])    Blood transfusion without reported diagnosis    Cataract    CHF (congestive heart failure) (HCC)    Chronic kidney disease    Clotting disorder (HCC)    COPD (chronic obstructive pulmonary disease) (HCC)    Depression    Diabetes mellitus without complication (HCC)    GERD (gastroesophageal reflux disease)    Heart murmur    Hyperlipidemia    Myocardial infarction ([place])    Neuromuscular disorder (HCC)    tremors   Seizures (HCC)    Sleep apnea    Stroke ([place])    Thyroid disease    Past Surgical History:  Past Surgical History:  Procedure Laterality Date   ABDOMINAL HYSTERECTOMY     APPENDECTOMY     bladder tack     CHOLECYSTECTOMY     CORONARY ARTERY BYPASS GRAFT     CYSTOSCOPY W/ URETERAL STENT PLACEMENT Left 05/01/2021   Procedure: CYSTOSCOPY WITH RETROGRADE PYELOGRAM/URETERAL STENT PLACEMENT;  Surgeon: Ceasar Mons, MD;  Location: [place];  Service: Urology;  Laterality: Left;   ESOPHAGOGASTRODUODENOSCOPY ENDOSCOPY     EYE SURGERY     FRACTURE SURGERY Right    3rd finger   JOINT REPLACEMENT Bilateral    knees   TONSILLECTOMY AND ADENOIDECTOMY     TUBAL LIGATION      Assessment & Plan Clinical Impression:  76 year old female with history of atrial fibrillation on Xarelto who presented to [facility] on 05/01/2021 with acute left hemiplegia. CT angiogram showed acute M1 M2 occlusion on the right side.  UA showed evidence of urinary tract infection.  She had hypotension requiring fluid boluses felt to be related to urosepsis.  In the [facility] ED a CT perfusion scan showed a 13 mL core infarct in the right MCA distribution.  Patient initially required pressor support in the ICU setting.  Had a 25 beat run of V. tach and dopamine drip was discontinued.  Left ureteral stone was discovered and underwent cystoscopy with left ureteral stent placement on 1/9 no neuro interventional procedures were recommended.  Other past medical history includes diabetes CKD 3 coronary artery disease status post CABG The patient was evaluated by neurology, stroke team, due to intracranial stenosis recommendations were to keep blood pressure goal from 1 46-5 50 systolic. The patient was evaluated by Dr. Alfonse Spruce from cardiology.  Echocardiogram showed normal EF with no significant valvular disease or source of embolism, grade 2 diastolic dysfunction was present. Patient transferred to CIR on 05/07/2021 .    Patient currently requires min with basic self-care skills secondary to impaired timing and sequencing, unbalanced muscle activation, decreased coordination, and decreased motor planning, decreased attention to left, and decreased safety awareness, decreased memory, and delayed processing.  Prior to hospitalization,  patient was requiring some assist with ADLs/IADLs from spouse.  Patient will benefit from skilled intervention to decrease level of assist with basic self-care skills and increase independence with basic self-care skills prior to discharge home with care partner.  Anticipate patient will require 24 hour supervision and follow up home health vs follow up outpatient.  OT - End of Session Activity Tolerance: Tolerates  10 - 20 min activity with multiple rests Endurance Deficit: Yes Endurance Deficit Description: Quick to fatigue; 3/4 SOB with ADLs OT Assessment Rehab Potential (ACUTE ONLY): Good OT Patient demonstrates impairments in the following area(s): Balance;Cognition;Endurance;Motor;Safety OT Basic ADL's Functional Problem(s): Grooming;Bathing;Dressing;Toileting OT Transfers Functional Problem(s): Toilet;Tub/Shower OT Plan OT Intensity: Minimum of 1-2 x/day, 45 to 90 minutes OT Frequency: 5 out of 7 days OT Duration/Estimated Length of Stay: 5-7 days OT Self Feeding Anticipated Outcome(s): N/A OT Basic Self-Care Anticipated Outcome(s): S OT Toileting Anticipated Outcome(s): S OT Bathroom Transfers Anticipated Outcome(s): S OT Recommendation Recommendations for Other Services: Speech consult Follow Up Recommendations: Other (comment) (TBD; would likely benefit best from outpatient follow-up) Equipment Recommended: To be determined   OT Evaluation Precautions/Restrictions  Precautions Precautions: Fall Precaution Comments: very HOH Restrictions Weight Bearing Restrictions: No General Chart Reviewed: Yes Additional Pertinent History: PMHx significant for A-fib, CKD III, DMII, CAD s/p CABG, Hx of CVA, anxiety/depression and clotting disorder. Family/Caregiver Present: No Vital Signs Therapy Vitals Pulse Rate: 79 Resp: 16 Oxygen Therapy SpO2: 100 % O2 Device: Room Air Pain Pain Assessment Pain Scale: Faces Faces Pain Scale: Hurts a little bit Pain Type: Acute pain Pain Location: Arm (PIC line) Pain Orientation: Right Pain Descriptors / Indicators: Sore Pain Onset: Other (Comment) (With UB dressing) Home Living/Prior Functioning Home Living Family/patient expects to be discharged to:: Private residence Living Arrangements: Spouse/significant other Available Help at Discharge: Family, Available 24 hours/day Type of Home: House Home Access: Level entry Home Layout: One  level Bathroom Shower/Tub: Public librarian, Armed forces training and education officer: Yes  Lives With: Spouse, Other (Comment) (80 y.o. grandson (senior in [employer])) IADL History Homemaking Responsibilities: No Current License: No Occupation: Retired Prior Function Level of Independence: Needs assistance with ADLs, Needs assistance with tranfers, Needs assistance with homemaking, Other (comment) (Sit to stands with I; assist with tub/shower transfers) Vision Baseline Vision/History: 1 Wears glasses (Readers) Ability to See in Adequate Light: 0 Adequate Patient Visual Report: No change from baseline Vision Assessment?: No apparent visual deficits Perception  Perception: Within Functional Limits Praxis Praxis: Intact Cognition Overall Cognitive Status: Impaired/Different from baseline Arousal/Alertness: Awake/alert Orientation Level: Person Year: 2023 Month: January Day of Week: Incorrect Memory: Impaired Memory Impairment: Decreased recall of new information;Decreased short term memory;Retrieval deficit Immediate Memory Recall: Sock;Blue;Bed Memory Recall Sock: Without Cue Memory Recall Blue: Without Cue Memory Recall Bed: Without Cue Safety/Judgment: Impaired Comments: Cues for safety Sensation Sensation Light Touch: Appears Intact Coordination Gross Motor Movements are Fluid and Coordinated: No Fine Motor Movements are Fluid and Coordinated: No Coordination and Movement Description: Impaired on L Finger Nose Finger Test: L dyskinesia Motor  Motor Motor: Hemiplegia Motor - Skilled Clinical Observations: Mild L hemi  Trunk/Postural Assessment  Cervical Assessment Cervical Assessment: Exceptions to [facility] (Forward head) Thoracic Assessment Thoracic Assessment: Exceptions to [facility] (Kyphotic) Lumbar Assessment Lumbar Assessment: Exceptions to [facility] (Posterior pelvic tilt) Postural Control Postural Control: Deficits on evaluation (Delayed; insufficient)   Balance Balance Balance Assessed: Yes Static Sitting Balance Static Sitting - Balance Support: No upper extremity supported;Feet supported Static Sitting - Level of Assistance: 5: Stand  by assistance Dynamic Sitting Balance Dynamic Sitting - Balance Support: No upper extremity supported;Feet supported Dynamic Sitting - Level of Assistance: 4: Min assist Dynamic Sitting - Balance Activities: Other (comment) Sitting balance - Comments: LOB to L with LB bathing/dressing, Min A to correct Static Standing Balance Static Standing - Balance Support: No upper extremity supported Static Standing - Level of Assistance: 5: Stand by assistance (Min guard to S) Dynamic Standing Balance Dynamic Standing - Balance Support: No upper extremity supported Dynamic Standing - Level of Assistance: 5: Stand by assistance (Min guard) Extremity/Trunk Assessment RUE Assessment RUE Assessment: Within Functional Limits LUE Assessment LUE Assessment: Exceptions to [facility] LUE Body System: Neuro Brunstrum levels for arm and hand: Arm;Hand Brunstrum level for arm: Stage IV Movement is deviating from synergy Brunstrum level for hand: Stage IV Movements deviating from synergies  Care Tool Care Tool Self Care Eating   Eating Assist Level: Set up assist    Oral Care  Oral care, brush teeth, clean dentures activity did not occur: Refused      Bathing   Body parts bathed by patient: Right arm;Left arm;Chest;Abdomen;Front perineal area;Right upper leg;Left upper leg;Face;Right lower leg;Left lower leg Body parts bathed by helper: Buttocks   Assist Level: Minimal Assistance - Patient > 75%    Upper Body Dressing(including orthotics)   What is the patient wearing?: Pull over shirt   Assist Level: Minimal Assistance - Patient > 75%    Lower Body Dressing (excluding footwear)   What is the patient wearing?: Pants;Underwear/pull up Assist for lower body dressing: Minimal Assistance - Patient > 75%    Putting  on/Taking off footwear   What is the patient wearing?: [place] for footwear: Supervision/Verbal cueing       Care Tool Toileting Toileting activity   Assist for toileting: Minimal Assistance - Patient > 75%     Care Tool Bed Mobility Roll left and right activity   Roll left and right assist level: Supervision/Verbal cueing    Sit to lying activity        Lying to sitting on side of bed activity   Lying to sitting on side of bed assist level: the ability to move from lying on the back to sitting on the side of the bed with no back support.: Supervision/Verbal cueing     Care Tool Transfers Sit to stand transfer   Sit to stand assist level: Contact Guard/Touching assist    Chair/bed transfer   Chair/bed transfer assist level: Contact Guard/Touching assist     Toilet transfer   Assist Level: Contact Guard/Touching assist     Care Tool Cognition  Expression of Ideas and Wants Expression of Ideas and Wants: 4. Without difficulty (complex and basic) - expresses complex messages without difficulty and with speech that is clear and easy to understand  Understanding Verbal and Non-Verbal Content Understanding Verbal and Non-Verbal Content: 3. Usually understands - understands most conversations, but misses some part/intent of message. Requires cues at times to understand   Memory/Recall Ability Memory/Recall Ability : That he or she is in a hospital/hospital unit   Refer to Care Plan for Long Term Goals  SHORT TERM GOAL WEEK 1 OT Short Term Goal 1 (Week 1): STG = LTG 2/2 ELOS  Recommendations for other services: Other: TBD    Skilled Therapeutic Intervention Patient met lying supine in bed in agreement with OT treatment session. Facial grimacing noted during UB bathing/dressing with patient reporting soreness at [facility] line insertion. Education provided on purpose  of rehab, role of OT, ELOS and goals. Patient expressed verbal understanding but would benefit from continued  education. Patient completed 3/3 parts of toileting and bathing/dressing with set-up to Min A overall. Please refer below for further details. Patient is very [place] requiring increased volume and increased time to follow verbal commands. Patient also limited by mild L hemi, mild L inattention, decreased activity tolerance, decreased balance presenting increased risk for falls and mild cognitive deficits and would benefit from continued skilled OT to maximize safety/independence with ADLs in prep for safe return home with spouse. Session concluded with patient seated in recliner with call bell within reach, belt alarm activated and all needs met.   ADL ADL Eating: Set up Where Assessed-Eating: Bed level Grooming: Supervision/safety Where Assessed-Grooming: Sitting at sink Upper Body Bathing: Supervision/safety Where Assessed-Upper Body Bathing: Edge of bed Lower Body Bathing: Minimal assistance Where Assessed-Lower Body Bathing: Edge of bed Upper Body Dressing: Minimal assistance Where Assessed-Upper Body Dressing: Edge of bed Lower Body Dressing: Minimal assistance Where Assessed-Lower Body Dressing: Edge of bed Toileting: Minimal assistance Where Assessed-Toileting: Bedside Commode Toilet Transfer: Therapist, music Method: Counselling psychologist: Radiographer, therapeutic: Not assessed ADL Comments: Cues for attention to L Mobility  Bed Mobility Bed Mobility: Rolling Right;Rolling Left;Supine to Sit Rolling Right: Supervision/verbal cueing Rolling Left: Supervision/Verbal cueing Supine to Sit: Supervision/Verbal cueing Transfers Sit to Stand: Contact Guard/Touching assist Stand to Sit: Contact Guard/Touching assist   Discharge Criteria: Patient will be discharged from OT if patient refuses treatment 3 consecutive times without medical reason, if treatment goals not met, if there is a change in medical status, if patient makes no progress towards  goals or if patient is discharged from hospital.  The above assessment, treatment plan, treatment alternatives and goals were discussed and mutually agreed upon: by patient  Jenny Kelly R [place] 05/08/2021, 8:25 AM

## 2021-05-08 NOTE — Progress Notes (Signed)
Inpatient Rehabilitation  Patient information reviewed and entered into eRehab system by Nevan Creighton Datra Clary, OTR/L.   Information including medical coding, functional ability and quality indicators will be reviewed and updated through discharge.    

## 2021-05-08 NOTE — Discharge Instructions (Addendum)
Inpatient Rehab Discharge Instructions  JALEI STAMLER Discharge date and time:  05/16/21  Activities/Precautions/ Functional Status: Activity: no lifting, driving, or strenuous exercise till cleared by MD Diet: cardiac diet. Foods need to be chopped for safety. Needs to drink a lot of fluids--water especially.  Wound Care: none needed   Functional status:  ___ No restrictions     ___ Walk up steps independently _X__ 24/7 supervision/assistance   ___ Walk up steps with assistance ___ Intermittent supervision/assistance  ___ Bathe/dress independently ___ Walk with walker     _X__ Bathe/dress with assistance ___ Walk Independently    ___ Shower independently _X__ Walk with assistance    ___ Shower with assistance ___ No alcohol     ___ Return to work/school ________  Special Instructions:    COMMUNITY REFERRALS UPON DISCHARGE:    Home Health:   [place] Phone: [phone]   Medical Equipment/Items Ordered: Has all needed equipment from past admits                                                 Agency/Supplier:NA    STROKE/TIA DISCHARGE INSTRUCTIONS SMOKING Cigarette smoking nearly doubles your risk of having a stroke & is the single most alterable risk factor  If you smoke or have smoked in the last 12 months, you are advised to quit smoking for your health. Most of the excess cardiovascular risk related to smoking disappears within a year of stopping. Ask you doctor about anti-smoking medications [place] Quit Line: 1-800-QUIT NOW Free Smoking Cessation Classes [phone]  CHOLESTEROL Know your levels; limit fat & cholesterol in your diet  Lipid Panel     Component Value Date/Time   CHOL 148 05/03/2021 0538   TRIG 132 05/03/2021 0538   HDL 24 (L) 05/03/2021 0538   CHOLHDL 6.2 05/03/2021 0538   VLDL 26 05/03/2021 0538   LDLCALC 98 05/03/2021 0538     Many patients benefit from treatment even if their cholesterol is at  goal. Goal: Total Cholesterol (CHOL) less than 160 Goal:  Triglycerides (TRIG) less than 150 Goal:  HDL greater than 40 Goal:  LDL (LDLCALC) less than 100   BLOOD PRESSURE American Stroke Association blood pressure target is less that 120/80 mm/Hg  Your discharge blood pressure is:  BP: 114/62 Monitor your blood pressure Limit your salt and alcohol intake Many individuals will require more than one medication for high blood pressure  DIABETES (A1c is a blood sugar average for last 3 months) Goal HGBA1c is under 7% (HBGA1c is blood sugar average for last 3 months)  Diabetes:     Lab Results  Component Value Date   HGBA1C 5.9 (H) 05/03/2021    Your HGBA1c can be lowered with medications, healthy diet, and exercise. Check your blood sugar as directed by your physician Call your physician if you experience unexplained or low blood sugars.  PHYSICAL ACTIVITY/REHABILITATION Goal is 30 minutes at least 4 days per week  Activity: No driving, Therapies: see below Return to work: N/A Activity decreases your risk of heart attack and stroke and makes your heart stronger.  It helps control your weight and blood pressure; helps you relax and can improve your mood. Participate in a regular exercise program. Talk  with your doctor about the best form of exercise for you (dancing, walking, swimming, cycling).  DIET/WEIGHT Goal is to maintain a healthy weight  Your discharge diet is:  Diet Order             DIET DYS 2 Room service appropriate? Yes; Fluid consistency: Thin  Diet effective now                   liquids Your height is:  Height: 5\' 2"  (157.5 cm) (pt stated) Your current weight is: Weight: 66.6 kg Your Body Mass Index (BMI) is:  BMI (Calculated): 26.85 Following the type of diet specifically designed for you will help prevent another stroke. Your goal weight is:   Your goal Body Mass Index (BMI) is 19-24. Healthy food habits can help reduce 3 risk factors for stroke:  High  cholesterol, hypertension, and excess weight.  RESOURCES Stroke/Support Group:  Call [phone]   STROKE EDUCATION PROVIDED/REVIEWED AND GIVEN TO PATIENT Stroke warning signs and symptoms How to activate emergency medical system (call 911). Medications prescribed at discharge. Need for follow-up after discharge. Personal risk factors for stroke. Pneumonia vaccine given:  Flu vaccine given:  My questions have been answered, the writing is legible, and I understand these instructions.  I will adhere to these goals & educational materials that have been provided to me after my discharge from the hospital.      My questions have been answered and I understand these instructions. I will adhere to these goals and the provided educational materials after my discharge from the hospital.  Patient/Caregiver Signature _______________________________ Date __________  Clinician Signature _______________________________________ Date __________  Please bring this form and your medication list with you to all your follow-up doctor's appointments.    Information on my medicine - XARELTO (Rivaroxaban)  This medication education was reviewed with me or my healthcare representative as part of my discharge preparation.  The pharmacist that spoke with me during my hospital stay was:    Why was Xarelto prescribed for you? Xarelto was prescribed for you to reduce the risk of a blood clot forming that can cause a stroke if you have a medical condition called atrial fibrillation (a type of irregular heartbeat).  What do you need to know about xarelto ? Take your Xarelto ONCE DAILY at the same time every day with your evening meal. If you have difficulty swallowing the tablet whole, you may crush it and mix in applesauce just prior to taking your dose.  Take Xarelto exactly as prescribed by your doctor and DO NOT stop taking Xarelto without talking to the doctor who prescribed the medication.   Stopping without other stroke prevention medication to take the place of Xarelto may increase your risk of developing a clot that causes a stroke.  Refill your prescription before you run out.  After discharge, you should have regular check-up appointments with your healthcare provider that is prescribing your Xarelto.  In the future your dose may need to be changed if your kidney function or weight changes by a significant amount.  What do you do if you miss a dose? If you are taking Xarelto ONCE DAILY and you miss a dose, take it as soon as you remember on the same day then continue your regularly scheduled once daily regimen the next day. Do not take two doses of Xarelto at the same time or on the same day.   Important Safety Information A possible side effect of Xarelto is bleeding.  You should call your healthcare provider right away if you experience any of the following: Bleeding from an injury or your nose that does not stop. Unusual colored urine (red or dark brown) or unusual colored stools (red or black). Unusual bruising for unknown reasons. A serious fall or if you hit your head (even if there is no bleeding).  Some medicines may interact with Xarelto and might increase your risk of bleeding while on Xarelto. To help avoid this, consult your healthcare provider or pharmacist prior to using any new prescription or non-prescription medications, including herbals, vitamins, non-steroidal anti-inflammatory drugs (NSAIDs) and supplements.  This website has more information on Xarelto: [email or web address]

## 2021-05-08 NOTE — Evaluation (Signed)
Physical Therapy Assessment and Plan  Patient Details  Name: Jenny Kelly MRN: [id number] Date of Birth: Sep 19, 1945  PT Diagnosis: Abnormality of gait, Cognitive deficits, Hemiplegia non-dominant, and Impaired cognition Rehab Potential: Good ELOS: 7 days   [name]: 05/08/2021 PT Individual Time: 1255-1415 PT Individual Time Calculation (min): 80 min    Hospital Problem: Principal Problem:   Right middle cerebral artery stroke (HCC) Active Problems:   Type 2 diabetes mellitus without complication (HCC)   PAF (paroxysmal atrial fibrillation) ([place])   Memory deficit   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)   E coli bacteremia   Pulmonary fibrosis (HCC)   S/P ureteral stent placement   Past Medical History:  Past Medical History:  Diagnosis Date   Allergy    Anemia    Anxiety    Arthritis    Asthma    Atrial fibrillation ([place])    Blood transfusion without reported diagnosis    Cataract    CHF (congestive heart failure) (HCC)    Chronic kidney disease    Clotting disorder (HCC)    COPD (chronic obstructive pulmonary disease) (HCC)    Depression    Diabetes mellitus without complication (HCC)    GERD (gastroesophageal reflux disease)    Heart murmur    Hyperlipidemia    Myocardial infarction ([place])    Neuromuscular disorder (HCC)    tremors   Seizures (HCC)    Sleep apnea    Stroke ([place])    Thyroid disease    Past Surgical History:  Past Surgical History:  Procedure Laterality Date   ABDOMINAL HYSTERECTOMY     APPENDECTOMY     bladder tack     CHOLECYSTECTOMY     CORONARY ARTERY BYPASS GRAFT     CYSTOSCOPY W/ URETERAL STENT PLACEMENT Left 05/01/2021   Procedure: CYSTOSCOPY WITH RETROGRADE PYELOGRAM/URETERAL STENT PLACEMENT;  Surgeon: Ceasar Mons, MD;  Location: [place];  Service: Urology;  Laterality: Left;   ESOPHAGOGASTRODUODENOSCOPY ENDOSCOPY     EYE SURGERY     FRACTURE SURGERY Right    3rd finger    JOINT REPLACEMENT Bilateral    knees   TONSILLECTOMY AND ADENOIDECTOMY     TUBAL LIGATION      Assessment & Plan Clinical Impression: 76 year old female with history of atrial fibrillation on Xarelto who presented to [facility] on 05/01/2021 with acute left hemiplegia. CT angiogram showed acute M1 M2 occlusion on the right side.  UA showed evidence of urinary tract infection.  She had hypotension requiring fluid boluses felt to be related to urosepsis.  In the [facility] ED a CT perfusion scan showed a 13 mL core infarct in the right MCA distribution.  Patient initially required pressor support in the ICU setting.  Had a 25 beat run of V. tach and dopamine drip was discontinued.  Left ureteral stone was discovered and underwent cystoscopy with left ureteral stent placement on 1/9 no neuro interventional procedures were recommended.  Other past medical history includes diabetes CKD 3 coronary artery disease status post CABG The patient was evaluated by neurology, stroke team, due to intracranial stenosis recommendations were to keep blood pressure goal from 1 49-2 50 systolic. The patient was evaluated by Dr. Alfonse Spruce from cardiology.  Echocardiogram showed normal EF with no significant valvular disease or source of embolism, grade 2 diastolic dysfunction was present. Patient transferred to CIR on 05/07/2021 .    Patient currently requires min with mobility secondary to muscle weakness, decreased  cardiorespiratoy endurance, impaired timing and sequencing, unbalanced muscle activation, and decreased coordination, and decreased attention, decreased awareness, decreased problem solving, decreased safety awareness, and decreased memory.  Prior to hospitalization, patient was independent  with mobility and lived with Spouse, Other (Comment) in a House home.  Home access is  Level entry.  Pt primarily limited by mild hemiparesis on generalized weakness, LUE/LLE coordination defecits UE>LE,  mild L  inattention, bilat knee pain, and poor endurance.    Patient will benefit from skilled PT intervention to maximize safe functional mobility, minimize fall risk, and decrease caregiver burden for planned discharge home with 24 hour supervision.  Anticipate patient will benefit from follow up OP at discharge.  PT - End of Session Activity Tolerance: Tolerates 10 - 20 min activity with multiple rests Endurance Deficit: Yes Endurance Deficit Description: Quick to fatigue; requires mult rest breaks between all activities due to SOB and knee pain. PT Assessment Rehab Potential (ACUTE/IP ONLY): Good PT Patient demonstrates impairments in the following area(s): Balance;Endurance;Motor;Pain;Safety PT Transfers Functional Problem(s): Bed Mobility;Bed to Chair;Car;Furniture PT Locomotion Functional Problem(s): Ambulation;Wheelchair Mobility;Stairs PT Plan PT Intensity: Minimum of 1-2 x/day ,45 to 90 minutes PT Frequency: 5 out of 7 days PT Duration Estimated Length of Stay: 7 days PT Treatment/Interventions: Ambulation/gait training;DME/adaptive equipment instruction;Psychosocial support;UE/LE Strength taining/ROM;Balance/vestibular training;UE/LE Coordination activities;Cognitive remediation/compensation;Functional mobility training;Neuromuscular re-education;Stair training;Wheelchair propulsion/positioning;Discharge planning;Pain management;Therapeutic Activities;Disease management/prevention;Patient/family education;Therapeutic Exercise;Community reintegration PT Transfers Anticipated Outcome(s): supervision PT Locomotion Anticipated Outcome(s): supervision w/LRAD PT Recommendation Follow Up Recommendations: Home health PT;24 hour supervision/assistance Patient destination: Home Equipment Details: has rollator  and cane per pt.   PT Evaluation Precautions/Restrictions Precautions Precautions: Fall Precaution Comments: very HOH Restrictions Weight Bearing Restrictions: No General   Vital  Signs  Pain Pain Assessment Pain Scale: 0-10 Pain Score: 0-No pain Pain Interference Pain Interference Pain Effect on Sleep: 3. Frequently Pain Interference with Therapy Activities: 3. Frequently Pain Interference with Day-to-Day Activities: 1. Rarely or not at all Home Living/Prior [place] Available Help at Discharge: Family;Available 24 hours/day Type of Home: House Home Access: Level entry Home Layout: One level Bathroom Toilet: Standard Bathroom Accessibility: Yes  Lives With: Spouse;Other (Comment) Prior Function Level of Independence: Needs assistance with ADLs;Needs assistance with homemaking;Other (comment);Independent with gait;Independent with transfers  Able to Take Stairs?: Yes Driving: No Vocation: Retired Vision/Perception  Vision - History Ability to See in Adequate Light: 0 Adequate  Cognition Overall Cognitive Status: History of cognitive impairments - at baseline Arousal/Alertness: Awake/alert Orientation Level: Oriented X4 Year: 2023 Month: January Day of Week: Incorrect Memory: Impaired Memory Impairment: Decreased recall of new information;Decreased short term memory;Retrieval deficit Awareness: Impaired Problem Solving: Impaired Safety/Judgment: Impaired Sensation Sensation Light Touch: Appears Intact Proprioception: Appears Intact Coordination Gross Motor Movements are Fluid and Coordinated: No Fine Motor Movements are Fluid and Coordinated: No Coordination and Movement Description: LUE and LLE dysmetria, mild tremor Finger Nose Finger Test: impaired L Heel Shin Test: impaired L Motor  Motor Motor: Hemiplegia Motor - Skilled Clinical Observations: Mild L hemi   Trunk/Postural Assessment  Cervical Assessment Cervical Assessment: Exceptions to [facility] (forward head) Thoracic Assessment Thoracic Assessment: Exceptions to WFL (kyphosis, rounded shoulders) Lumbar Assessment Lumbar Assessment: Exceptions to [facility] Postural  Control Postural Control: Deficits on evaluation (delayed)  Balance Balance Balance Assessed: Yes Standardized Balance Assessment Standardized Balance Assessment: Berg Balance Test Berg Balance Test Sit to Stand: Able to stand  independently using hands Standing Unsupported: Able to stand 2 minutes with supervision Sitting with Back Unsupported but Feet Supported  on Floor or Stool: Able to sit safely and securely 2 minutes Stand to Sit: Controls descent by using hands Transfers: Able to transfer safely, definite need of hands Standing Unsupported with Eyes Closed: Able to stand 10 seconds with supervision Standing Ubsupported with Feet Together: Able to place feet together independently but unable to hold for 30 seconds From Standing, Reach Forward with Outstretched Arm: Can reach forward >5 cm safely (2") From Standing Position, Pick up Object from Floor: Able to pick up shoe, needs supervision From Standing Position, Turn to Look Behind Over each Shoulder: Turn sideways only but maintains balance Turn 360 Degrees: Able to turn 360 degrees safely but slowly Standing Unsupported, Alternately Place Feet on Step/Stool: Able to complete >2 steps/needs minimal assist Standing Unsupported, One Foot in Front: Loses balance while stepping or standing Standing on One Leg: Unable to try or needs assist to prevent fall Total Score: 31 Extremity Assessment  RUE Assessment RUE Assessment: Exceptions to [facility] General Strength Comments: generalized weakness grossly 4/5 LUE Assessment LUE Assessment: Exceptions to [facility] General Strength Comments: <3.5 at shoulder, <4/5 elbow and wrist LUE Body System: Neuro Brunstrum levels for arm and hand: Arm;Hand Brunstrum level for arm: Stage IV Movement is deviating from synergy Brunstrum level for hand: Stage IV Movements deviating from synergies RLE Assessment General Strength Comments: generalized weakness grossly 4/5 LLE Assessment LLE Assessment: Within  Functional Limits General Strength Comments: hip flexion 3-, abd/add at least 2+, quads 4-/5, hamdsrigns 4-/5, ankle DF 4-/5  Care Tool Care Tool Bed Mobility Roll left and right activity   Roll left and right assist level: Supervision/Verbal cueing    Sit to lying activity   Sit to lying assist level: Supervision/Verbal cueing    Lying to sitting on side of bed activity   Lying to sitting on side of bed assist level: the ability to move from lying on the back to sitting on the side of the bed with no back support.: Minimal Assistance - Patient > 75%     Care Tool Transfers Sit to stand transfer   Sit to stand assist level: Contact Guard/Touching assist    Chair/bed transfer   Chair/bed transfer assist level: Contact Guard/Touching assist     Physiological scientist transfer assist level: Contact Guard/Touching assist      Care Tool Locomotion Ambulation   Assist level: Contact Guard/Touching assist Assistive device: No Device Max distance: 90  Walk 10 feet activity   Assist level: Contact Guard/Touching assist Assistive device: No Device   Walk 50 feet with 2 turns activity   Assist level: Contact Guard/Touching assist Assistive device: No Device  Walk 150 feet activity Walk 150 feet activity did not occur: Safety/medical concerns      Walk 10 feet on uneven surfaces activity   Assist level: Minimal Assistance - Patient > 75% Assistive device: Other (comment) (no device)  Stairs   Assist level: Minimal Assistance - Patient > 75% Stairs assistive device: 2 hand rails Max number of stairs: 4  Walk up/down 1 step activity   Walk up/down 1 step (curb) assist level: Moderate Assistance - Patient - 50 - 74% Walk up/down 1 step or curb assistive device: No device  Walk up/down 4 steps activity   Walk up/down 4 steps assist level: Contact Guard/Touching assist Walk up/down 4 steps assistive device: No device  Walk up/down 12 steps activity   Walk  up/down 12 steps assist level:  Maximal Assistance - Patient 25 - 49% Walk up/down 12 steps assistive device: 2 hand rails  Pick up small objects from floor   Pick up small object from the floor assist level: Contact Guard/Touching assist Pick up small object from the floor assistive device: none  Wheelchair Is the patient using a wheelchair?: No          Wheel 50 feet with 2 turns activity      Wheel 150 feet activity        Refer to Care Plan for Winter 1 PT Short Term Goal 1 (Week 1): STGs = LTGs  Recommendations for other services: None   Skilled Therapeutic Intervention  Evaluation completed (see details above and below) with education on PT POC and goals and individual treatment initiated with focus on functional mobility/transfers, LE strength, dynamic standing balance/coordination, ambulation, stair navigation, simulated car transfers, and improved endurance with activity Pt  oriented to rehab unit.  Discussed process of daily scheduling and receiving schedule, purpose of team conference schedule and D/c planning.  Pt provided w/ 18X18 wheelchair and cushion and adjustments made to promote optimal seating posture and pressure distribution.  Pt also provided w/ RW and therapist adjusted to proper height for patient.   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  At end of session, pt performed stand pivot transfer to bed w/cga, sit to supine w/min assist.  Pt left supine w/rails up x 3, alarm set, bed in lowest position, and needs in reach, nurse in room to admin meds.    Mobility Bed Mobility Bed Mobility: Rolling Right;Rolling Left;Supine to Sit Rolling Right: Supervision/verbal cueing Rolling Left: Supervision/Verbal cueing Supine to Sit: Minimal Assistance - Patient > 75% Transfers Transfers: Sit to Stand;Stand to  Sit;Stand Pivot Transfers Sit to Stand: Contact Guard/Touching assist Stand to Sit: Contact Guard/Touching assist Stand Pivot Transfers: Contact Guard/Touching assist Transfer (Assistive device): None Locomotion  Gait Ambulation: Yes Gait Assistance: Contact Guard/Touching assist Gait Distance (Feet): 90 Feet Assistive device: None Gait Gait: Yes Gait velocity: ER of LLE, overall decreased step length, step height, and cadece, decreased bilat armswing Stairs / Additional Locomotion Stairs: Yes Stairs Assistance: Contact Guard/Touching assist;Minimal Assistance - Patient > 75% Stair Management Technique: Two rails Number of Stairs: 4 Height of Stairs: 6 Curb: Minimal Assistance - Patient >75%   Discharge Criteria: Patient will be discharged from PT if patient refuses treatment 3 consecutive times without medical reason, if treatment goals not met, if there is a change in medical status, if patient makes no progress towards goals or if patient is discharged from hospital.  The above assessment, treatment plan, treatment alternatives and goals were discussed and mutually agreed upon: by patient Callie Fielding, [place] 05/08/2021, 2:05 PM

## 2021-05-09 DIAGNOSIS — I63511 Cerebral infarction due to unspecified occlusion or stenosis of right middle cerebral artery: Secondary | ICD-10-CM | POA: Diagnosis not present

## 2021-05-09 MED ORDER — MAGNESIUM GLUCONATE 500 MG PO TABS
500.0000 mg | ORAL_TABLET | Freq: Every day | ORAL | Status: DC
  Administered 2021-05-09: 500 mg via ORAL
  Filled 2021-05-09 (×2): qty 1

## 2021-05-09 MED ORDER — CLONIDINE HCL 0.1 MG/24HR TD PTWK
0.1000 mg | MEDICATED_PATCH | TRANSDERMAL | Status: DC
  Administered 2021-05-09: 0.1 mg via TRANSDERMAL
  Filled 2021-05-09: qty 1

## 2021-05-09 MED ORDER — BUTALBITAL-APAP-CAFFEINE 50-325-40 MG PO TABS
1.0000 | ORAL_TABLET | Freq: Four times a day (QID) | ORAL | Status: DC | PRN
  Administered 2021-05-09 – 2021-05-10 (×2): 1 via ORAL
  Filled 2021-05-09 (×2): qty 1

## 2021-05-09 NOTE — Progress Notes (Signed)
Physical Therapy Session Note  Patient Details  Name: Jenny Kelly MRN: [id number] Date of Birth: [birth year]  [name]: 05/09/2021 PT Individual Time: 0800-0915 PT Individual Time Calculation (min): 75 min   Short Term Goals: Week 1:  PT Short Term Goal 1 (Week 1): STGs = LTGs  Skilled Therapeutic Interventions/Progress Updates:  Session 1   Pt met in bed w/ husband present and agreed to therapy. Supine<>sit minA for trunk elevation, sit<>stand CGA, and minA for donning pants, shirt, socks and shoes. Pt ambulated CGA to bathroom and completed peri-care w/ S. MinA sit<>stand from standard toilet w/ recommendation for elevated toilet use. Pt's husband confirmed use of elevated toilet at home. Motor planning deficits w/ hand washing as VC required for use of soap and paper towels. Pt fatigued quickly after peri-care requiring seated rest break on bed, HR 120 and O2 95. Pt complained of HA while attempting to lay down on R side from sitting, BP 115/76 in sitting and 123/86 in standing. Pt taken to 42M rehab gym in w/c for time. TUG completed (times below) w/ RW, CGA, and seated rest break w/ symptom monitoring between each session. HR between 90-100 after each trial, w/ desat to 89 SPO2 x1. MD morning round visit during rest break. Cone finding w/ RW CGA x1 ~36ft and cone weaving CGA x1 ~15 ft done to assess visual scanning and obstacle maneuver. Pt completed w/o difficulties demonstrating ability to scan and maneuver environment, but required seated rest break due to fatigue and HA after each activity. Pt educated on using RW for safety despite not wanting to, voiced understanding. Pt taken back to room in w/c and assisted to bed w/ HOB raised, bed alarm on, and all needs met. Plan to focus on bed mobility during PM session. TUG - Avg: 31s 1-33s 2-30s 3-29s  Session 2 Pt sitting EOB w/ NT at start of session needing to use bathroom and complainants of LBP. Pt sit<>stand and ambulated to  bathroom CGA, completing peri-care w/ S. Brief changed w/ minA to pull over L hip due to pts inability to grasp using L hand. Pt washed hands w/ soap and water w/ proper sequencing and no cues. Pt ambulated back to EOB w/ CGA for bed mobility. Pt demonstrated sit<>supine, supine<>sit on R, and bed rolling w/ S. Pt required minA for trunk support for supine<>sit on L due to LUE weakness. Pt stated L side of bed at home is against wall and does not complete L supine<>sit transfer. LBP improved w/ movement since start of session and pt assisted to recliner w/ CGA at end of session. Pt left sitting in recliner to rest, alarm belt on, and all needs met.  Therapy Documentation Precautions:  Precautions Precautions: Fall Precaution Comments: very HOH Restrictions Weight Bearing Restrictions: No General:      Therapy/Group: Individual Therapy  Lucile Shutters, SPT  05/09/2021, 9:24 AM

## 2021-05-09 NOTE — Progress Notes (Signed)
Occupational Therapy Session Note  Patient Details  Name: Jenny Kelly MRN: [id number] Date of Birth: [birth year]  [name]: 05/09/2021 OT Individual Time: 7829-5621 OT Individual Time Calculation (min): 24 min    Short Term Goals: Week 1:  OT Short Term Goal 1 (Week 1): STG = LTG 2/2 ELOS  Skilled Therapeutic Interventions/Progress Updates:  Skilled OT intervention completed with focus on functional transfers, pain management, and cognitive memory tasks. Pt received seated in recliner, initially declining OT session due to headache however agreeable to session in room. Pt scooting forward for prep to stand for standing task at table top, however pt expressing pain caused by "cramp" in R hip. Encouraged pt to stand to promote movement for pain relief due to perhaps sitting too long, with pt sit > stand with CGA using RW. Pt stating that pain worse in standing, with request to get into bed, with pt completing short ambulatory transfer with AD to EOB with CGA. Pt able to complete bed mobility with supervision. While sitting upright in bed, pt agreeable to participate in cognitive task at table top, using go fish cards with matching purpose to promote functional memory needed for self-care. Pt left semi-supine, with bed alarm on and all needs in reach at end of session.   Therapy Documentation Precautions:  Precautions Precautions: Fall Precaution Comments: very HOH Restrictions Weight Bearing Restrictions: No  Pain: Unrated pain in R hip, reportedly a cramp, repositioned pt semi-supine in bed, RN made aware   Therapy/Group: Individual Therapy  Valentin Benney E Alys Dulak 05/09/2021, 7:58 AM

## 2021-05-09 NOTE — Progress Notes (Signed)
Occupational Therapy Session Note  Patient Details  Name: Jenny Kelly MRN: [id number] Date of Birth: 08-14-1945  [name]: 05/09/2021 OT Individual Time: 1040-1055 OT Individual Time Calculation (min): 15 min    Short Term Goals: Week 1:  OT Short Term Goal 1 (Week 1): STG = LTG 2/2 ELOS  Skilled Therapeutic Interventions/Progress Updates:  Pt with respiratory therapists when OT first arrived therefore returned 15 minutes later.  Pt semi reclined and reporting a 10/10 headache and asking to just rest.  Husband in room with pt and he reports she used to get headaches at home but this one seems worse than normal.  Assessed pts vitals and were the following: BP at rest 149/119, pulse 68.  Nurse made aware directly. Nurse reports she plans to consult PA.  OT held at this time.  Will attempt this afternoon as able.     Therapy Documentation Precautions:  Precautions Precautions: Fall Precaution Comments: very HOH Restrictions Weight Bearing Restrictions: No    Therapy/Group: Individual Therapy  Amie Critchley 05/09/2021, 2:53 PM

## 2021-05-09 NOTE — Progress Notes (Signed)
PROGRESS NOTE   Subjective/Complaints: Elevated BP, headache- added clonidine patch, increased magnesium to 56m at night, added fioricet prn Husband at bedside No other complaints  ROS: +headache  Objective:   No results found. No results for input(s): WBC, HGB, HCT, PLT in the last 72 hours.  No results for input(s): NA, K, CL, CO2, GLUCOSE, BUN, CREATININE, CALCIUM in the last 72 hours.   Intake/Output Summary (Last 24 hours) at 05/09/2021 1205 Last data filed at 05/09/2021 0834 Gross per 24 hour  Intake 820 ml  Output --  Net 820 ml        Physical Exam: Vital Signs Blood pressure 113/61, pulse 76, temperature 98.3 F (36.8 C), temperature source Oral, resp. rate 16, height _0  (1.575 m), weight 66.6 kg, SpO2 92 %. Gen: no distress, normal appearing HEENT: oral mucosa pink and moist, NCAT Cardio: Reg rate Chest: normal effort, normal rate of breathing Abd: soft, non-distended Ext: no edema Psych: pleasant, normal affect Skin: intact Neurological:     Mental Status: She is alert.     Cranial Nerves: No dysarthria or facial asymmetry.     Sensory: Sensation is intact.     Motor: Weakness present.     Coordination: Coordination abnormal.     Gait: Gait abnormal.     Comments: Oriented to person time situation but not date   Motor strength is 5/5 in the right deltoid bicep tricep grip hip flexor knee extensor ankle dorsiflexor 4/5 in the left deltoid bicep tricep hip flexor knee extensor ankle dorsiflexor Left neglect is evident but can be cued to look to the left  Psychiatric:        Mood and Affect: Mood normal.    Assessment/Plan: 1. Functional deficits which require 3+ hours per day of interdisciplinary therapy in a comprehensive inpatient rehab setting. Physiatrist is providing close team supervision and 24 hour management of active medical problems listed below. Physiatrist and rehab team  continue to assess barriers to discharge/monitor patient progress toward functional and medical goals  Care Tool:  Bathing  Bathing activity did not occur: Safety/medical concerns Body parts bathed by patient: Right arm, Left arm, Chest, Abdomen, Front perineal area, Right upper leg, Left upper leg, Face, Right lower leg, Left lower leg   Body parts bathed by helper: Buttocks     Bathing assist Assist Level: Minimal Assistance - Patient > 75%     Upper Body Dressing/Undressing Upper body dressing Upper body dressing/undressing activity did not occur (including orthotics): Safety/medical concerns What is the patient wearing?: Pull over shirt    Upper body assist Assist Level: Minimal Assistance - Patient > 75%    Lower Body Dressing/Undressing Lower body dressing      What is the patient wearing?: Pants, Underwear/pull up     Lower body assist Assist for lower body dressing: Minimal Assistance - Patient > 75%     Toileting Toileting    Toileting assist Assist for toileting: Minimal Assistance - Patient > 75%     Transfers Chair/bed transfer  Transfers assist     Chair/bed transfer assist level: Contact Guard/Touching assist     Locomotion Ambulation   Ambulation assist  Assist level: Contact Guard/Touching assist Assistive device: No Device Max distance: 90   Walk 10 feet activity   Assist     Assist level: Contact Guard/Touching assist Assistive device: No Device   Walk 50 feet activity   Assist    Assist level: Contact Guard/Touching assist Assistive device: No Device    Walk 150 feet activity   Assist Walk 150 feet activity did not occur: Safety/medical concerns         Walk 10 feet on uneven surface  activity   Assist     Assist level: Minimal Assistance - Patient > 75% Assistive device: Other (comment) (no device)   Wheelchair     Assist Is the patient using a wheelchair?: No             Wheelchair 50  feet with 2 turns activity    Assist            Wheelchair 150 feet activity     Assist          Blood pressure 113/61, pulse 76, temperature 98.3 F (36.8 C), temperature source Oral, resp. rate 16, height _0  (1.575 m), weight 66.6 kg, SpO2 92 %.    Medical Problem List and Plan: 1. 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  Antithrombotics: -DVT/anticoagulation:  Pharmaceutical: Xarelto             -antiplatelet therapy: Aspirin 81 mg/day 3. Chronic headaches: Tylenol, continue pregabalin 50 mg twice daily, Topamax 200 mg twice daily. Add fioricet prn.  4. Mood: Monitor, consider neuropsych if depressed mood             -antipsychotic agents: None used at the current time   5. Neuropsych: This patient is capable of making decisions on her own behalf. 6. Skin/Wound Care: Monitor skin, turn every 2 hours 7. Fluids/Electrolytes/Nutrition: Routine I's and O's, check be met in a.m. 8.  UTI E. coli Pyelonephritis with stents for left hydronephrosis related to ureteral stone, was septic and treated with IV antibiotics, continue cephalexin 500 mg 3 times daily until 05/12/2021. Start probiotic. 9.  Constipation: d/c colace. Increase magnesium to 526m HS 10.  Cognitive deficit versus pre-existing continued Aricept 10 mg nightly 11.  COPD continue guaifenesin as needed as well as Proventil as needed and Breo elliptica 200/25 MCG  1 puff daily, Singulair 10 mg daily, umeclidinium bromide 62.5 MCG 1 puff daily at 1530 12.  Pulmonary fibrosis post COVID, continue 1 to 2 L of oxygen, did not use oxygen at home prior to hospitalization #13.  Hyperlipidemia continue pravastatin 40 mg/day 14.  Dysphagia stroke continue D2 diet follow-up with speech therapy 15.  CODE STATUS is partial, no CPR, no defibrillation 16.  HTN: add clonidine patch    LOS: 2 days A FACE TO FACE EVALUATION WAS PERFORMED  KMartha ClanP Sayaka Hoeppner 05/09/2021, 12:05 PM

## 2021-05-10 DIAGNOSIS — I63511 Cerebral infarction due to unspecified occlusion or stenosis of right middle cerebral artery: Secondary | ICD-10-CM | POA: Diagnosis not present

## 2021-05-10 LAB — URINALYSIS, ROUTINE W REFLEX MICROSCOPIC
Bilirubin Urine: NEGATIVE
Glucose, UA: NEGATIVE mg/dL
Ketones, ur: NEGATIVE mg/dL
Nitrite: NEGATIVE
Protein, ur: 100 mg/dL — AB
Specific Gravity, Urine: 1.025 (ref 1.005–1.030)
pH: 6 (ref 5.0–8.0)

## 2021-05-10 LAB — URINALYSIS, MICROSCOPIC (REFLEX): RBC / HPF: >50 RBC/hpf (ref 0–5)

## 2021-05-10 MED ORDER — CEPHALEXIN 250 MG PO CAPS
500.0000 mg | ORAL_CAPSULE | Freq: Two times a day (BID) | ORAL | Status: DC
  Administered 2021-05-10 – 2021-05-12 (×4): 500 mg via ORAL
  Filled 2021-05-10 (×4): qty 2

## 2021-05-10 MED ORDER — MAGNESIUM GLUCONATE 500 MG PO TABS
250.0000 mg | ORAL_TABLET | Freq: Every day | ORAL | Status: DC
  Administered 2021-05-10 – 2021-05-13 (×4): 250 mg via ORAL
  Filled 2021-05-10 (×5): qty 1

## 2021-05-10 NOTE — IPOC Note (Signed)
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)   E coli bacteremia   Pulmonary fibrosis (HCC)   S/P ureteral stent placement     Functional Problem List: Nursing Bladder, Bowel, Endurance, Pain, Perception, Safety, Sensory  PT Balance, Endurance, Motor, Pain, Safety  OT Balance, Cognition, Endurance, Motor, Safety  SLP    TR         Basic ADLs: OT Grooming, Bathing, Dressing, Toileting     Advanced  ADLs: OT       Transfers: PT Bed Mobility, Bed to Chair, Set designer, Occupational psychologist, Research scientist (life sciences): PT Ambulation, Psychologist, prison and probation services, Stairs     Additional Impairments: OT    SLP None      TR      Anticipated Outcomes Item Anticipated Outcome  Self Feeding N/A  Swallowing      Basic self-care  S  Toileting  S   Bathroom Transfers S  Bowel/Bladder  mod I  Transfers  supervision  Locomotion  supervision w/LRAD  Communication     Cognition     Pain  pain less than 2  Safety/Judgment  mod I   Therapy Plan: PT Intensity: Minimum of 1-2 x/day ,45 to 90 minutes PT Frequency: 5 out of 7 days PT Duration Estimated Length of Stay: 7 days OT Intensity: Minimum of 1-2 x/day, 45 to 90 minutes OT Frequency: 5 out of 7 days OT Duration/Estimated Length of Stay: 5-7 days     Due to the current state of emergency, patients may not be receiving their 3-hours of Medicare-mandated therapy.   Team Interventions: Nursing Interventions Patient/Family Education, Pain Management, Bladder Management, Disease Management/Prevention, Medication  Management, Discharge Planning  PT interventions Ambulation/gait training, DME/adaptive equipment instruction, Psychosocial support, UE/LE Strength taining/ROM, Metallurgist training, UE/LE Coordination activities, Cognitive remediation/compensation, Functional mobility training, Neuromuscular re-education, Stair training, Wheelchair propulsion/positioning, Discharge planning, Pain management, Therapeutic Activities, Disease management/prevention, Patient/family education, Therapeutic Exercise, Community reintegration  OT Interventions    SLP Interventions    TR Interventions    SW/CM Interventions Discharge Planning, Psychosocial Support, Patient/Family Education   Barriers to Discharge MD  Medical stability  Nursing Decreased caregiver support, Incontinence, Home environment access/layout 1 level, leve entry w spouse, has RW, BSC/3N1 and SS  PT      OT      SLP      SW       Team Discharge Planning: Destination: PT-Home ,OT-   , SLP-Home Projected Follow-up: PT-Home health PT, 24 hour supervision/assistance, OT-  Other (comment) (TBD; would likely benefit best from outpatient follow-up), SLP-None, 24 hour supervision/assistance Projected Equipment Needs: PT- , OT- To be determined, SLP-None recommended by SLP Equipment Details: PT-has rollator  and cane per pt., OT-  Patient/family involved in discharge planning: PT- Patient,  OT-Patient, Family member/caregiver, SLP-Patient, Family member/caregiver  MD ELOS: 10-12 days Medical Rehab Prognosis:  Excellent Assessment: Jenny Kelly is a 76 year old woman admitted to CIR with functional deficits secondary to right MCA infarct. Active issues include dysuria and abdominal pain. Medications are being managed, and labs and vitals are being monitored regularly.  See Team Conference Notes for weekly updates to the plan of care

## 2021-05-10 NOTE — Progress Notes (Signed)
Occupational Therapy Session Note  Patient Details  Name: Jenny Kelly MRN: [id number] Date of Birth: September 24, 1945  [name]: 05/10/2021 OT Individual Time: 8546-2703 OT Individual Time Calculation (min): 45 min    Short Term Goals: Week 1:  OT Short Term Goal 1 (Week 1): STG = LTG 2/2 ELOS  Skilled Therapeutic Interventions/Progress Updates:   Pt asleep in bed, easy to awake, pt reporting fatigue and low back pain (unrated) but asking to use bathroom.  Pt completed right roll and sidelying to sit with supervision.  Sit to stand and ambulation without AD with CGA to bathroom.  Bedside commode transfer with supervision.  Continent of urine and collected for assessment.  Pt completed pericare and clothing mgt with close supervision.  Ambulated to sink and washed hands in standing with CGA.  Pt completed UB bathing seated at sink with supervision needing min cues to initiate likely due to fatigue and pain.  Observed husband assisting pt during ambulation in room and functional transfers and appears independent and safe therefore cleared husband to complete in room assist during transfers and toileting.  Pt seated in recliner, call bell in reach, seat alarm on at end of session. Missed 15 minutes of treatment due to fatigue and pain.  Therapy Documentation Precautions:  Precautions Precautions: Fall Precaution Comments: very HOH Restrictions Weight Bearing Restrictions: No   Therapy/Group: Individual Therapy  Amie Critchley 05/10/2021, 12:41 PM

## 2021-05-10 NOTE — Progress Notes (Signed)
Physical Therapy Session Note  Patient Details  Name: Jenny Kelly MRN: [id number] Date of Birth: [birth year]  [name]: 05/10/2021 PT Individual Time: 4098-1191 PT Individual Time Calculation (min): 70 min   Short Term Goals: Week 1:  PT Short Term Goal 1 (Week 1): STGs = LTGs  Skilled Therapeutic Interventions/Progress Updates:    Pt met laying in bed w/ husband present after MD and LPN visit. Pt complained of LBP while laying in bed and agreed to therapy after some persuading. Supine<>sit w/ S and independent EOB sitting. Pt donned pants, shirts, socks and shoes w/ modA for time. CGA sit<>stand to w/c from bed and wheeled to 61M rehab gym for time. Pt ambulated ~57ft w/ RW and CGA followed by seated rest break w/o change in LBP. 6MWT 5xSTS, SPPB performed, results below. Pt completed 3 stairs ascending and descending step to pattern w/ 2 hand rails CGA. O2 stats monitored w/ pulse ox throughout session and pt remained above 91% SPO2. Pt demonstrated inability to complete narrow BoS activities w/o LOB. Pt wheeled back to room and assisted CGA to recliner. Left sitting w/ posey belt, call bell, husband present, all needs met. No increase in LBP noted throughout session. 6MWT - 340ft w/ RW CGA 2 min seated rest break after 2:45 5xSTS - 23s SPPB 2/12 indicating increased falls risk, gait speed 0.31 m/s  Therapy Documentation Precautions:  Precautions Precautions: Fall Precaution Comments: very HOH Restrictions Weight Bearing Restrictions: No General:     Therapy/Group: Individual Therapy  Lucile Shutters, SPT  05/10/2021, 9:55 AM

## 2021-05-10 NOTE — Patient Care Conference (Signed)
Inpatient RehabilitationTeam Conference and Plan of Care Update Date: 05/10/2021   Time: 11:18 AM    Patient Name: Jenny Kelly      Medical Record Number: [id number]  Date of Birth: 1946-02-14 Sex: Female         Room/Bed: 4M06C/4M06C-01 Payor Info: Payor: [employer] MEDICARE / Plan: [place] HMO / Product Type: *No Product type* /    Admit Date/Time:  05/07/2021  1:55 PM  Primary Diagnosis:  Right middle cerebral artery stroke [facility])  Hospital Problems: Principal Problem:   Right middle cerebral artery stroke (HCC) Active Problems:   Type 2 diabetes mellitus without complication (HCC)   PAF (paroxysmal atrial fibrillation) (HCC)   Memory deficit   Cerebral infarction due to thrombosis of right middle cerebral artery ([place])   E coli bacteremia   Pulmonary fibrosis ([place])   S/P ureteral stent placement    Expected Discharge Date: Expected Discharge Date: 05/15/21  Team Members Present: Physician leading conference: Dr. Leeroy Cha Social Worker Present: Ovidio Kin, LCSW Nurse Present: Dorien Chihuahua, RN PT Present: Ginnie Smart, PT OT Present: Leretha Pol, OT PPS Coordinator present : Gunnar Fusi, SLP     Current Status/Progress Goal Weekly Team Focus  Bowel/Bladder     Continent        Swallow/Nutrition/ Hydration             ADL's   CGA-min assist for functional mobility and self care; limited by significant headaches  sup  functional transfers training, caregiver/patient education, self care training   Mobility   CGA bed mobility, CGA to minA for sit<>stand and stand<>pivot transfers. CGA to minA for ambulating short distances ~63ft with RW. Limited by HA/migraines and decreased activity tolerance.  supervision overall  Activity tolerance, dynamic standing balance, gait training with LRAD, DC planning   Communication             Safety/Cognition/ Behavioral Observations            Pain     N/A        Skin     MASD to buttocks; cream for  treatment   MASD healing   Toileting, keep skin clean and dry; apply cream after toileting and prn    Discharge Planning:  Home with husband who is able to assist and was prior to admission with ADL's. Pt wants to participate but her headaches limit her in therapies   Team Discussion: Headaches improved with clonidine addition. MD addressing loose bowels and checking UA C+S. Magnesium level down, supplement added per MD. Patient with statin intolerance in the past is tolerating prevachol at present without difficulties. Restarted inhalers and nasal sprays.   Patient on target to meet rehab goals: yes, currently needs CGA - min assist for self care. Needs CGA - supervision for sit-stand and stand pivot transfers. Able to ambulate using a RW up to 225' with CGA. Tolerating activity without oxygen. Goals for discharge set for supervision level.  *See Care Plan and progress notes for long and short-term goals.   Revisions to Treatment Plan:  N/A   Teaching Needs: Safety, medications, secondary risk management, dietary modification recommendations, transfers, etc.   Current Barriers to Discharge: Decreased caregiver support  Possible Resolutions to Barriers: Family education; assisted spouse with My Chart set up No DME:has BSC, RW, rollator, SPC, wc and shower chair     Medical Summary Current Status: dysuria, lower abdominal pain, headaches, hypertension, overweight, stroke, statin intolerance in past, loose stool  Barriers to  Discharge: Medical stability  Barriers to Discharge Comments: dysuria, lower abdominal pain, headaches, hypertension, overweight, stroke, statin intolerance in past, loose stool Possible Resolutions to [employer] Focus: UA/UC today, continue topamax/clonidine patch, magnesium (decrease dose), statin and continue to assess for tolerance, provide dietary education   Continued Need for Acute Rehabilitation Level of Care: The patient requires daily medical  management by a physician with specialized training in physical medicine and rehabilitation for the following reasons: Direction of a multidisciplinary physical rehabilitation program to maximize functional independence : Yes Medical management of patient stability for increased activity during participation in an intensive rehabilitation regime.: Yes Analysis of laboratory values and/or radiology reports with any subsequent need for medication adjustment and/or medical intervention. : Yes   I attest that I was present, lead the team conference, and concur with the assessment and plan of the team.   Dorien Chihuahua B 05/10/2021, 7:54 PM

## 2021-05-10 NOTE — Progress Notes (Signed)
Patient ID: Jenny Kelly, female   DOB: 1945/07/27, 76 y.o.   MRN: [id number]  met with pt and husband to update regarding team conference goals of supervision level and target discharge date of 1/23. Pt is happy with this date and hopeful her possible UTI does not make her stay here longer. Both reports they have the needed equipment and prefer to use [facility] for home health since has had them before. Will make referral to [facility] and work on any other discharge needs. Husband is here daily and has observed pt in therapies.

## 2021-05-10 NOTE — Progress Notes (Signed)
Physical Therapy Session Note  Patient Details  Name: Jenny Kelly MRN: [id number] Date of Birth: 1945-09-27  [name]: 05/10/2021 PT Individual Time: 1030-1055; 1600-1650 PT Individual Time Calculation (min): 25 min and 50 min PT Missed Time: 10 min Missed Time Reason: patient fatigue  Short Term Goals: Week 1:  PT Short Term Goal 1 (Week 1): STGs = LTGs  Skilled Therapeutic Interventions/Progress Updates:    Session 1: Pt received seated in recliner asleep, arousable and agreeable to PT session. Pt reports some pain in her R lower back that does not increase with mobility, reports she is taking Tylenol for pain management. Sit to stand with CGA during session. Pt transfers with no AD and CGA for balance. Pt declines ambulation this session due to fatigue, dependent transport via w/c to/from therapy gym. Standing alt L/R 4" step-ups with min HHA for balance, 2 x 10 reps to fatigue. Pt has significant increase in R lower back pain, requests to return to room. Per pt and her husband medical team is determining source of pain and checking lab values. Pt requests to return to bed in room due to fatigue. Sit to supine Supervision. Pt left seated in bed with needs in reach, bed alarm in place, husband present.  Session 2: Pt received supine in bed asleep, arousable and agreeable to PT session. Pt reports ongoing pain in R lower back, declines intervention. Bed mobility Supervision with increased time and use of bedrail. Sit to stand and stand pivot transfer to w/c with CGA. Nustep level 3 x 5 min with use of BLE only for global endurance training. Ambulation through obstacle course with CGA and no AD weaving through cones and stepping over obstacles for functional balance training. Pt reports urge to urinate, toilet transfer with CGA. Pt is Supervision for standing balance while managing clothing, increased time needed to complete clothing management. Pt's brief found to be soiled, assist pt with  donning new brief. Ambulation x 100 ft with CGA for balance, some ataxia noted especially with onset of fatigue. Pt impulsive when attempting to sit during gait due to fatigue. Pt requests to return room due to fatigue. Pt returned to bed at Supervision level, left sidelying in bed with needs in reach. Pt missed 10 min of scheduled therapy session due to fatigue.   Therapy Documentation Precautions:  Precautions Precautions: Fall Precaution Comments: very HOH Restrictions Weight Bearing Restrictions: No      Therapy/Group: Individual Therapy   Peter Congo, PT, DPT, CSRS  05/10/2021, 11:20 AM

## 2021-05-10 NOTE — Progress Notes (Signed)
PROGRESS NOTE   Subjective/Complaints: Complains of pain with urination and lowe abdominal pain today Headaches have improved Stool loose  ROS: +abdominal pain  Objective:   No results found. No results for input(s): WBC, HGB, HCT, PLT in the last 72 hours.  No results for input(s): NA, K, CL, CO2, GLUCOSE, BUN, CREATININE, CALCIUM in the last 72 hours.   Intake/Output Summary (Last 24 hours) at 05/10/2021 1121 Last data filed at 05/10/2021 0810 Gross per 24 hour  Intake 480 ml  Output --  Net 480 ml        Physical Exam: Vital Signs Blood pressure (!) 98/58, pulse 69, temperature 98.1 F (36.7 C), resp. rate 16, height $RemoveBe'5\' 2"'CNKEuvQoI$  (1.575 m), weight 66.6 kg, SpO2 92 %. Gen: no distress, normal appearing HEENT: oral mucosa pink and moist, NCAT Cardio: Reg rate Chest: normal effort, normal rate of breathing Abd: soft, non-distended Ext: no edema Psych: pleasant, normal affect Skin: intact Neurological:     Mental Status: She is alert.     Cranial Nerves: No dysarthria or facial asymmetry.     Sensory: Sensation is intact.     Motor: Weakness present.     Coordination: Coordination abnormal.     Gait: Gait abnormal.     Comments: Oriented to person time situation but not date   Motor strength is 5/5 in the right deltoid bicep tricep grip hip flexor knee extensor ankle dorsiflexor 4/5 in the left deltoid bicep tricep hip flexor knee extensor ankle dorsiflexor Left neglect is evident but can be cued to look to the left  Psychiatric:        Mood and Affect: Mood normal.    Assessment/Plan: 1. Functional deficits which require 3+ hours per day of interdisciplinary therapy in a comprehensive inpatient rehab setting. Physiatrist is providing close team supervision and 24 hour management of active medical problems listed below. Physiatrist and rehab team continue to assess barriers to discharge/monitor patient progress  toward functional and medical goals  Care Tool:  Bathing    Body parts bathed by patient: Right arm, Left arm, Chest, Abdomen, Front perineal area, Right upper leg, Left upper leg, Face, Right lower leg, Left lower leg   Body parts bathed by helper: Buttocks     Bathing assist Assist Level: Minimal Assistance - Patient > 75%     Upper Body Dressing/Undressing Upper body dressing   What is the patient wearing?: Pull over shirt    Upper body assist Assist Level: Minimal Assistance - Patient > 75%    Lower Body Dressing/Undressing Lower body dressing      What is the patient wearing?: Pants, Underwear/pull up     Lower body assist Assist for lower body dressing: Minimal Assistance - Patient > 75%     Toileting Toileting    Toileting assist Assist for toileting: Minimal Assistance - Patient > 75%     Transfers Chair/bed transfer  Transfers assist     Chair/bed transfer assist level: Contact Guard/Touching assist     Locomotion Ambulation   Ambulation assist      Assist level: Contact Guard/Touching assist Assistive device: No Device Max distance: 90   Walk 10 feet  activity   Assist     Assist level: Contact Guard/Touching assist Assistive device: No Device   Walk 50 feet activity   Assist    Assist level: Contact Guard/Touching assist Assistive device: No Device    Walk 150 feet activity   Assist Walk 150 feet activity did not occur: Safety/medical concerns         Walk 10 feet on uneven surface  activity   Assist     Assist level: Minimal Assistance - Patient > 75% Assistive device: Other (comment) (no device)   Wheelchair     Assist Is the patient using a wheelchair?: No             Wheelchair 50 feet with 2 turns activity    Assist            Wheelchair 150 feet activity     Assist          Blood pressure (!) 98/58, pulse 69, temperature 98.1 F (36.7 C), resp. rate 16, height $RemoveBe'5\' 2"'JYOGBroDB$  (1.575  m), weight 66.6 kg, SpO2 92 %.    Medical Problem List and Plan: 1. 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  Antithrombotics: -DVT/anticoagulation:  Pharmaceutical: Xarelto             -antiplatelet therapy: Aspirin 81 mg/day 3. Chronic headaches: Tylenol, continue pregabalin 50 mg twice daily, Topamax 200 mg twice daily. Add fioricet prn.  4. Mood: Monitor, consider neuropsych if depressed mood             -antipsychotic agents: None used at the current time   5. Neuropsych: This patient is capable of making decisions on her own behalf. 6. Skin/Wound Care: Monitor skin, turn every 2 hours 7. Fluids/Electrolytes/Nutrition: Routine I's and O's, check be met in a.m. 8.  UTI E. coli Pyelonephritis with stents for left hydronephrosis related to ureteral stone, was septic and treated with IV antibiotics, continue cephalexin 500 mg 3 times daily until 05/12/2021. Start probiotic. 9.  Loose stool: d/c colace. Decrease magnesium to 250 mg HS 10.  Cognitive deficit versus pre-existing continued Aricept 10 mg nightly 11.  COPD continue guaifenesin as needed as well as Proventil as needed and Breo elliptica 200/25 MCG  1 puff daily, Singulair 10 mg daily, umeclidinium bromide 62.5 MCG 1 puff daily at 1530 12.  Pulmonary fibrosis post COVID, continue 1 to 2 L of oxygen, did not use oxygen at home prior to hospitalization #13.  Hyperlipidemia continue pravastatin 40 mg/day 14.  Dysphagia stroke continue D2 diet follow-up with speech therapy 15.  CODE STATUS is partial, no CPR, no defibrillation 16. HTN: resolved, d/c clonidine patch 17. Pain with urination: new UA/UC ordered     LOS: 3 days A FACE TO FACE EVALUATION WAS PERFORMED  Jenny Kelly 05/10/2021, 11:21 AM

## 2021-05-11 ENCOUNTER — Inpatient Hospital Stay (HOSPITAL_COMMUNITY): Payer: Medicare HMO

## 2021-05-11 ENCOUNTER — Encounter (HOSPITAL_COMMUNITY): Payer: Self-pay | Admitting: Physical Medicine and Rehabilitation

## 2021-05-11 DIAGNOSIS — I63511 Cerebral infarction due to unspecified occlusion or stenosis of right middle cerebral artery: Secondary | ICD-10-CM | POA: Diagnosis not present

## 2021-05-11 LAB — URINE CULTURE: Culture: NO GROWTH

## 2021-05-11 MED ORDER — IOHEXOL 300 MG/ML  SOLN
100.0000 mL | Freq: Once | INTRAMUSCULAR | Status: AC | PRN
  Administered 2021-05-11: 100 mL via INTRAVENOUS

## 2021-05-11 NOTE — Progress Notes (Signed)
Physical Therapy Session Note  Patient Details  Name: Jenny Kelly MRN: [id number] Date of Birth: 05/18/1945  [name]: 05/11/2021 PT Individual Time: 9357-0177 PT Individual Time Calculation (min): 68 min   Short Term Goals: Week 1:  PT Short Term Goal 1 (Week 1): STGs = LTGs  Skilled Therapeutic Interventions/Progress Updates:      Pt supine in bed at start of session - agreeable to PT tx. Reports unrated back pain as well and upper neck pain. Provided rest breaks, mobiltity, and STM at end of session for pain management. Supine<>sit with supervision with HOB elevated and use of bed rail. Stand<>pivot transfer at [facility] level with no AD with VC for safety awareness. Transported in w/c to day room rehab gym for time and energy conservation. Instructed in gait training ~148ft with RW and CGA around nurses station - VC for keeping body within walker frame and reducing L truncal lean - difficulty correcting upon command so during rest break provided demonstration of cues. 2nd gait trial ~78ft with CGA and RW with improved safety awareness with RW by keeping body within frame and reducing L lean. After seated rest, continued gait training with agility ladder - completed alternating reciprocal stepping x2 sets with CGA and no AD to challenge balance. Then completed side stepping L<>R x1 set, requiring minA for balance while using no AD. While side stepping, pt having fatigue after ~32ft, resulting in increased forward flexed trunk which contributed to her exacerbation of LBP. Pt returned to her room in w/c for energy conservation where PT completed STM for L supraspinatus, upper trap, and levator - used disposable hot pack as well to relax musculature. Education provided on AROM for cervical rotation and ext/flex to reduce L neck/shldr pain. Pt requesting to return to bed due to fatigue. Completed ambulatory transfer with CGA and no AD and able to complete sit>supine with supervision. Remained  semi-reclined in bed with bed alarm on, all needs within reach.   *Pt requiring frequent and extended rest breaks throughout session due to fatigue. Some dyspnea on exertion with gait training however vitals assessed and WNL.   Therapy Documentation Precautions:  Precautions Precautions: Fall Precaution Comments: very HOH Restrictions Weight Bearing Restrictions: No General:     Therapy/Group: Individual Therapy  Orrin Brigham 05/11/2021, 2:11 PM

## 2021-05-11 NOTE — Progress Notes (Signed)
Occupational Therapy Session Note  Patient Details  Name: Jenny Kelly MRN: [id number] Date of Birth: [birth year]  [name]: 05/11/2021 OT Individual Time: 1120-1140 OT Individual Time Calculation (min): 20 min  OT Missed Time: 40 Minutes Missed Time Reason: Patient fatigue  Short Term Goals: Week 1:  OT Short Term Goal 1 (Week 1): STG = LTG 2/2 ELOS  Skilled Therapeutic Interventions/Progress Updates:    Pt semi reclined and asleep with husband present.  Pts husband reports she just finished with PT and seems pretty wiped out.  Pt easily awakened and c/o pain LUE stating "my whole arm hurts".  Pt describes it as soreness and aching from reaching during activities with PT previous session.  OT provided soft tissue massage with trigger point release technique.  Noted trigger point at insertion of levator scapulae.  Applied moist heat pack to neck after.  Skin intact prior/after application.  Pt requesting to rest after this.  Attempted to treat pt after lunch and pt once again sleeping.  Awakened and pt stating she is "so tired, I just want to sleep please".  Pt missed 40 minutes of treatment due to fatigue.  Therapy Documentation Precautions:  Precautions Precautions: Fall Precaution Comments: very HOH Restrictions Weight Bearing Restrictions: No   Therapy/Group: Individual Therapy  Ezekiel Slocumb 05/11/2021, 3:11 PM

## 2021-05-11 NOTE — Progress Notes (Signed)
PROGRESS NOTE   Subjective/Complaints: Complains of right sided low back pain. No longer with abdominal pain or pain with urination. Discussed that her UA was positive for UTI and have started her on Keflex and a probiotic.   ROS: +abdominal pain, +right sided lower back pain  Objective:   No results found. No results for input(s): WBC, HGB, HCT, PLT in the last 72 hours.  No results for input(s): NA, K, CL, CO2, GLUCOSE, BUN, CREATININE, CALCIUM in the last 72 hours.   Intake/Output Summary (Last 24 hours) at 05/11/2021 1037 Last data filed at 05/11/2021 0805 Gross per 24 hour  Intake 720 ml  Output --  Net 720 ml        Physical Exam: Vital Signs Blood pressure (!) 111/55, pulse 65, temperature 98.6 F (37 C), temperature source Oral, resp. rate 16, height _0  (1.575 m), weight 66.6 kg, SpO2 90 %. Gen: no distress, normal appearing HEENT: oral mucosa pink and moist, NCAT Cardio: Reg rate Chest: normal effort, normal rate of breathing Abd: soft, non-distended Ext: no edema Psych: pleasant, normal affect Skin: intact Neurological:     Mental Status: She is alert.     Cranial Nerves: No dysarthria or facial asymmetry.     Sensory: Sensation is intact.     Motor: Weakness present.     Coordination: Coordination abnormal.     Gait: Gait abnormal.     Comments: Oriented to person time situation but not date   Motor strength is 5/5 in the right deltoid bicep tricep grip hip flexor knee extensor ankle dorsiflexor 4/5 in the left deltoid bicep tricep hip flexor knee extensor ankle dorsiflexor Left neglect is evident but can be cued to look to the left  Psychiatric:        Mood and Affect: Mood normal.  GU: Right sided flank tenderness to palpation   Assessment/Plan: 1. Functional deficits which require 3+ hours per day of interdisciplinary therapy in a comprehensive inpatient rehab setting. Physiatrist is  providing close team supervision and 24 hour management of active medical problems listed below. Physiatrist and rehab team continue to assess barriers to discharge/monitor patient progress toward functional and medical goals  Care Tool:  Bathing    Body parts bathed by patient: Right arm, Left arm, Chest, Abdomen, Front perineal area, Right upper leg, Left upper leg, Face, Right lower leg, Left lower leg   Body parts bathed by helper: Buttocks     Bathing assist Assist Level: Minimal Assistance - Patient > 75%     Upper Body Dressing/Undressing Upper body dressing   What is the patient wearing?: Pull over shirt    Upper body assist Assist Level: Minimal Assistance - Patient > 75%    Lower Body Dressing/Undressing Lower body dressing      What is the patient wearing?: Underwear/pull up, Pants     Lower body assist Assist for lower body dressing: Minimal Assistance - Patient > 75%     Toileting Toileting    Toileting assist Assist for toileting: Minimal Assistance - Patient > 75%     Transfers Chair/bed transfer  Transfers assist     Chair/bed transfer assist level: Contact  Guard/Touching assist     Locomotion Ambulation   Ambulation assist      Assist level: Contact Guard/Touching assist Assistive device: No Device Max distance: 90   Walk 10 feet activity   Assist     Assist level: Contact Guard/Touching assist Assistive device: No Device   Walk 50 feet activity   Assist    Assist level: Contact Guard/Touching assist Assistive device: No Device    Walk 150 feet activity   Assist Walk 150 feet activity did not occur: Safety/medical concerns         Walk 10 feet on uneven surface  activity   Assist     Assist level: Minimal Assistance - Patient > 75% Assistive device: Other (comment) (no device)   Wheelchair     Assist Is the patient using a wheelchair?: No             Wheelchair 50 feet with 2 turns  activity    Assist            Wheelchair 150 feet activity     Assist          Blood pressure (!) 111/55, pulse 65, temperature 98.6 F (37 C), temperature source Oral, resp. rate 16, height _0  (1.575 m), weight 66.6 kg, SpO2 90 %.    Medical Problem List and Plan: 1. 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  Antithrombotics: -DVT/anticoagulation:  Pharmaceutical: Xarelto             -antiplatelet therapy: Aspirin 81 mg/day 3. Chronic headaches: Tylenol, continue pregabalin 50 mg twice daily, Topamax 200 mg twice daily. Add fioricet prn.  4. Mood: Monitor, consider neuropsych if depressed mood             -antipsychotic agents: None used at the current time   5. Neuropsych: This patient is capable of making decisions on her own behalf. 6. Skin/Wound Care: Monitor skin, turn every 2 hours 7. Fluids/Electrolytes/Nutrition: Routine I's and O's, check be met in a.m. 8.  UTI E. coli Pyelonephritis with stents for left hydronephrosis related to ureteral stone, was septic and treated with IV antibiotics, restarted keflex as developed new pain with urination and UA was positive, Keflex started 1/18, f/u UC. Start probiotic. 9.  Loose stool: d/c colace. Decrease magnesium to 250 mg HS 10.  Cognitive deficit versus pre-existing continued Aricept 10 mg nightly 11.  COPD continue guaifenesin as needed as well as Proventil as needed and Breo elliptica 200/25 MCG  1 puff daily, Singulair 10 mg daily, umeclidinium bromide 62.5 MCG 1 puff daily at 1530 12.  Pulmonary fibrosis post COVID, continue 1 to 2 L of oxygen, did not use oxygen at home prior to hospitalization #13.  Hyperlipidemia continue pravastatin 40 mg/day 14.  Dysphagia stroke continue D2 diet follow-up with speech therapy 15.  CODE STATUS is partial, no  CPR, no defibrillation 16. Hypotension: medications reviewed and on noting that should significantly lower BP.  17. Right sided flank pain: given UTI and prior hydronephrosis, will order CT abdomen/pelvis.      LOS: 4 days A FACE TO FACE EVALUATION WAS PERFORMED  Clide Deutscher Annaleia Pence 05/11/2021, 10:37 AM

## 2021-05-11 NOTE — Progress Notes (Signed)
Occupational Therapy Discharge Summary  Patient Details  Name: Jenny Kelly MRN: [id number] Date of Birth: [birth year]    Patient has met 9 of 9 long term goals due to improved activity tolerance, improved balance, postural control, ability to compensate for deficits, functional use of  LEFT upper extremity, improved attention, improved awareness, and improved coordination.  Patient to discharge at overall Supervision level.  Patient's care partner, husband is independent to provide the necessary physical and cognitive assistance at discharge.    Reasons goals not met: NA  Recommendation:  No skilled OT indicated for follow up at this time. Pt will follow up with outpatient PT at discharge.  Equipment: No equipment provided  Reasons for discharge: treatment goals met and discharge from hospital  Patient/family agrees with progress made and goals achieved: Yes  OT Discharge Precautions/Restrictions  Precautions Precautions: Fall Precaution Comments: very HOH  ADL ADL Eating: Set up Where Assessed-Eating: Bed level Grooming: Supervision/safety Where Assessed-Grooming: Sitting at sink Upper Body Bathing: Supervision/safety Where Assessed-Upper Body Bathing: Sitting at sink (unable to shower secondary to PICC) Lower Body Bathing: Supervision/safety Where Assessed-Lower Body Bathing: Sitting at sink, Standing at sink Upper Body Dressing: Setup Where Assessed-Upper Body Dressing: Sitting at sink Lower Body Dressing: Supervision/safety Where Assessed-Lower Body Dressing: Sitting at sink, Standing at sink Toileting: Supervision/safety Where Assessed-Toileting: Glass blower/designer: Close supervision Toilet Transfer Method: Ambulating, Stand pivot Science writer: Energy manager: Close supervison Clinical cytogeneticist Method: Patent attorney with back ADL Comments: Cues for attention to L Vision Baseline Vision/History:  1 Wears glasses Patient Visual Report: No change from baseline Vision Assessment?: No apparent visual deficits Perception  Perception: Within Functional Limits Praxis Praxis: Intact Cognition Orientation Level: Oriented X4 Year: 2023 Month: January Day of Week: Incorrect Memory: Impaired Memory Impairment: Decreased recall of new information;Decreased short term memory;Retrieval deficit Immediate Memory Recall: Sock;Blue;Bed Memory Recall Sock: Without Cue Memory Recall Blue: Without Cue Memory Recall Bed: Without Cue Awareness Impairment: Anticipatory impairment Sensation Sensation Light Touch: Appears Intact Hot/Cold: Appears Intact Proprioception: Appears Intact Stereognosis: Not tested Coordination Gross Motor Movements are Fluid and Coordinated: No Motor  Motor Motor: Hemiplegia Mobility  Bed Mobility Bed Mobility: Rolling Right;Rolling Left;Supine to Sit;Sit to Supine Rolling Right: Supervision/verbal cueing Rolling Left: Supervision/Verbal cueing Supine to Sit: Supervision/Verbal cueing Sit to Supine: Supervision/Verbal cueing Transfers Sit to Stand: Supervision/Verbal cueing Stand to Sit: Supervision/Verbal cueing  Trunk/Postural Assessment  Cervical Assessment Cervical Assessment: Exceptions to [facility] (forward head) Thoracic Assessment Thoracic Assessment: Exceptions to [facility] (kyphosis and rounded shoulders) Lumbar Assessment Lumbar Assessment: Exceptions to [facility] (posterior pelvic tilt) Postural Control Postural Control: Deficits on evaluation (delayed)  Balance Balance Balance Assessed: Yes Static Sitting Balance Static Sitting - Balance Support: No upper extremity supported;Feet supported Static Sitting - Level of Assistance: 7: Independent Dynamic Sitting Balance Dynamic Sitting - Balance Support: No upper extremity supported;Feet supported Dynamic Sitting - Level of Assistance: 5: Stand by assistance Static Standing Balance Static Standing - Balance  Support: No upper extremity supported;During functional activity Static Standing - Level of Assistance: 5: Stand by assistance Dynamic Standing Balance Dynamic Standing - Balance Support: No upper extremity supported;During functional activity Dynamic Standing - Level of Assistance: 5: Stand by assistance Extremity/Trunk Assessment RUE Assessment RUE Assessment: Exceptions to [facility] General Strength Comments: generalized weakness grossly 4/5 LUE Assessment LUE Assessment: Exceptions to [facility] General Strength Comments: 3+/5 shoulder, 4/5 distally Brunstrum levels for arm and hand: Arm;Hand Brunstrum level for arm: Stage V Relative Independence  from [employer] Brunstrum level for hand: Stage VI Isolated joint movements   Bonnie L Elsdon 05/11/2021, 3:06 PM

## 2021-05-11 NOTE — Progress Notes (Signed)
Physical Therapy Session Note  Patient Details  Name: Jenny Kelly MRN: [id number] Date of Birth: 12-20-1945  [name]: 05/11/2021 PT Individual Time: 0900-1000 - 60 min    Short Term Goals: Week 1:  PT Short Term Goal 1 (Week 1): STGs = LTGs  Skilled Therapeutic Interventions/Progress Updates:     Pt met asleep in bed w/ husband present. Pt slowly woken up and agreed to therapy. Pt reported LBP, similar to previous days, but no increase in pain at this time. Pt donned shoes w/ total A for time and sit<>stand CGA to w/c. Pt wheeled to 55M rehab gym for time. Pt completed standing 2 min ball tosses outside BoS ~5 ft away w/ MinA for balance. Pt demonstrated difficulty w/ reaching w/ L UE and balance outside BoS. MD morning visit during rest break w/ discussion of urinalysis results. Pt ambulated to hallway to complete heel to toe ambulation ~46f w/ RW and CGA, no LOB noted. After seated rest break pt completed reaching horseshoes to top of mirror x1 w/ RUE and x1 w/ LUE and CGA. Pt had no difficulties w/ RUE but was unable to complete w/ LUE and complained of L shoulder pain during activity. Target lowered to waist level but pt still unable to complete due to L shoulder pain. Pt stated pain at distal L biceps and deltoid attachment, trapezius, and supraspinatus. L shoulder A/PROM flexion ~90 deg and ABD ~75 w/ empty end feel. R shoulder AROM FLEX and ABD ~110 deg. L shoulder pain subsided w/ rest. Finished session w/ figure 8 ~[id number] w/ RW, CGA, and seated rest break inbetween each rep. Pt very fatigued and wheeled back to room, sit<>stand to recliner. Left in recliner, posey belt on, call bell in reach, husband present, and all needs met.   Therapy Documentation Precautions:  Precautions Precautions: Fall Precaution Comments: very HOH Restrictions Weight Bearing Restrictions: No General:      Therapy/Group: Individual Therapy  CoLucile ShuttersSPT  05/11/2021, 7:47  AM

## 2021-05-12 LAB — CBC WITH DIFFERENTIAL/PLATELET
Abs Immature Granulocytes: 0.15 10*3/uL — ABNORMAL HIGH (ref 0.00–0.07)
Basophils Absolute: 0.1 10*3/uL (ref 0.0–0.1)
Basophils Relative: 1 %
Eosinophils Absolute: 0.4 10*3/uL (ref 0.0–0.5)
Eosinophils Relative: 4 %
HCT: 34.5 % — ABNORMAL LOW (ref 36.0–46.0)
Hemoglobin: 10.3 g/dL — ABNORMAL LOW (ref 12.0–15.0)
Immature Granulocytes: 2 %
Lymphocytes Relative: 28 %
Lymphs Abs: 2.8 10*3/uL (ref 0.7–4.0)
MCH: 27 pg (ref 26.0–34.0)
MCHC: 29.9 g/dL — ABNORMAL LOW (ref 30.0–36.0)
MCV: 90.3 fL (ref 80.0–100.0)
Monocytes Absolute: 0.7 10*3/uL (ref 0.1–1.0)
Monocytes Relative: 7 %
Neutro Abs: 5.7 10*3/uL (ref 1.7–7.7)
Neutrophils Relative %: 58 %
Platelets: 295 10*3/uL (ref 150–400)
RBC: 3.82 MIL/uL — ABNORMAL LOW (ref 3.87–5.11)
RDW: 20.1 % — ABNORMAL HIGH (ref 11.5–15.5)
WBC: 9.8 10*3/uL (ref 4.0–10.5)
nRBC: 0 % (ref 0.0–0.2)

## 2021-05-12 MED ORDER — CEPHALEXIN 250 MG PO CAPS
500.0000 mg | ORAL_CAPSULE | Freq: Once | ORAL | Status: AC
  Administered 2021-05-12: 500 mg via ORAL
  Filled 2021-05-12: qty 2

## 2021-05-12 NOTE — Progress Notes (Signed)
Occupational Therapy Session Note  Patient Details  Name: Jenny Kelly MRN: [id number] Date of Birth: [birth year]  [name]: 05/12/2021 OT Individual Time: 1000-1115 OT Individual Time Calculation (min): 75 min    Short Term Goals: Week 1:  OT Short Term Goal 1 (Week 1): STG = LTG 2/2 ELOS  Patient met after transfer to commode with assist from NT. Patient in agreement with OT treatment session with handoff from NT.  0/10 pain reported at rest and with activity. Patient completed clothing management with guarding assist but required Min A for hygiene (husband assists with hygiene at baseline). Mobility to sink surface with Min guard progressing to close supervision A without AD. Patient then completed hand hygiene standing at sink with supervision A. Noted SOB with light activity (appears to be baseline 2/2 COPD). Patient with desire to don nightgown completing task in standing with set-u p assist. Return to supine with supervision A and increased time. Patient declined further participation with skilled OT missing 30 minutes of treatment session. Session concluded with patient lying supine in bed with call bell within reach, bed alarm activated and all needs met.   Therapy Documentation Precautions:  Precautions Precautions: Fall Precaution Comments: very HOH Restrictions Weight Bearing Restrictions: No General: General OT Amount of Missed Time: 30 Minutes  Therapy/Group: Individual Therapy  Neil Brickell R Howerton-Davis 05/12/2021, 11:20 AM

## 2021-05-12 NOTE — Progress Notes (Signed)
Inpatient Rehabilitation Discharge Medication Review by a Pharmacist  A complete drug regimen review was completed for this patient to identify any potential clinically significant medication issues.  High Risk Drug Classes Is patient taking? Indication by Medication  Antipsychotic No   Anticoagulant Yes Xarelto for Afib  Antibiotic No Abx completed  Opioid No   Antiplatelet Yes ASA - CAD  Hypoglycemics/insulin No   Vasoactive Medication No   Chemotherapy No   Other Yes Astelin, Flonase, Atrovent NS, Anoro, Singulair, Pulmicort nebs for COPD Aricept for dementia Protonix and Pepcid for Gerd Topamax, Lyrica for HA Pravastatin for HLD Synthroid for low thyroid     Type of Medication Issue Identified Description of Issue Recommendation(s)  Drug Interaction(s) (clinically significant)     Duplicate Therapy     Allergy     No Medication Administration End Date     Incorrect Dose     Additional Drug Therapy Needed     Significant med changes from prior encounter (inform family/care partners about these prior to discharge).    Other       Clinically significant medication issues were identified that warrant physician communication and completion of prescribed/recommended actions by midnight of the next day:  No  Pharmacist comments:   Time spent performing this drug regimen review (minutes):  20 minutes  Ulyses Southward, PharmD, [place], AAHIVP, CPP Infectious Disease Pharmacist 05/12/2021 12:14 PM

## 2021-05-12 NOTE — Progress Notes (Signed)
Inpatient Rehabilitation Care Coordinator Discharge Note   Patient Details  Name: Jenny Kelly MRN: [id number] Date of Birth: 22-Apr-1946   Discharge location: HOME WITH HUSBAND WHO CAN PROVIDE 24/7 SUPERVISION  Length of Stay:  9 DAYS  Discharge activity level: SUPERVISION LEVEL  Home/community participation: ACTIVE  Patient response [id number] Literacy - How often do you need to have someone help you when you read instructions, pamphlets, or other written material from your doctor or pharmacy?: Sometimes  Patient response [id number] Isolation - How often do you feel lonely or isolated from those around you?: Never  Services provided included: MD, RD, PT, OT, SLP, RN, CM, Pharmacy, SW  Financial Services:  Charity fundraiser Utilized: Environmental education officer MEDICARE  Choices offered to/list presented to: PT AND HUSBAND  Follow-up services arranged:  Home Health, Patient/Family request agency HH/DME Home Health Agency: [facility] FROM PAST ADMISSIONS.      HH/DME Requested Agency: [facility] WITH THEM  Patient response to transportation need: Is the patient able to respond to transportation needs?: Yes In the past 12 months, has lack of transportation kept you from medical appointments or from getting medications?: No In the past 12 months, has lack of transportation kept you from meetings, work, or from getting things needed for daily living?: No    Comments (or additional information): [place]. BOT FEEL READY TO GO HOME AND VERY PLEASED WITH HER PROGRESS WHILE HERE.  Patient/Family verbalized understanding of follow-up arrangements:  Yes  Individual responsible for coordination of the follow-up plan: JAMES-HUSBAND  [phone]  Confirmed correct DME delivered: Jenny Kelly 05/12/2021    Jenny Kelly, Jenny Kelly

## 2021-05-12 NOTE — Plan of Care (Signed)
°  Problem: RH Balance Goal: LTG Patient will maintain dynamic standing balance (PT) Description: LTG:  Patient will maintain dynamic standing balance with assistance during mobility activities (PT) Flowsheets (Taken 05/12/2021 1245) LTG: Pt will maintain dynamic standing balance during mobility activities with:: Contact Guard/Touching assist Note: Downgraded due to safety

## 2021-05-12 NOTE — Progress Notes (Signed)
Occupational Therapy Session Note  Patient Details  Name: Jenny Kelly MRN: [id number] Date of Birth: [birth year]  [name]: 05/12/2021 OT Individual Time: 1000-1115 OT Individual Time Calculation (min): 75 min    Short Term Goals: Week 1:  OT Short Term Goal 1 (Week 1): STG = LTG 2/2 ELOS  Skilled Therapeutic Interventions/Progress Updates:    Pt sitting up in recliner, husband at side.  Notable high pitched ringing sound present however both husband and pt unable to hear.  Noted pt wearing new hearing aids causing sound.  Pt needing total assist to problem solve proper fit and changing earbuds to improve.  Pt requesting to use the bathroom.  Pts husband provided safe CGA during sit to stand, ambulation and toilet transfer with pt.  Pt had continent episode of urine (see flowsheet).  Pt completed pericare and clothing management with supervision.  Pt returned to recliner with help of husband safely. Completed BIMs, assessed BUE coordination using finger to nose test. Educated pt on BUE strengthening using light resistive band and sponge.  Pt completed shoulder horizontal abd/add, shoulder ER at side, rows, lats, and gripping.  Pt needing mod multimodal cues to achieve correct positioning and sequence of movement.  Handout provided to increase carryover.  Frequency to complete 3 sets of 10 reps once per day.  Pt requesting to return to bed at this time and pts husband again observed providing safe assist to pt for sit to stand, step pivot, stand to sit and sit to supine.  Call bell in reach and end of session.    Therapy Documentation Precautions:  Precautions Precautions: Fall Precaution Comments: HOH Restrictions Weight Bearing Restrictions: No  Orientation Level: Oriented X4 Year: 2023 Month: January Day of Week: Incorrect Memory: Impaired Memory Impairment: Decreased recall of new information;Decreased short term memory;Retrieval deficit Immediate Memory Recall:  Sock;Blue;Bed Memory Recall Sock: Without Cue Memory Recall Blue: Without Cue Memory Recall Bed: Without Cue Awareness Impairment: Anticipatory impairment   Therapy/Group: Individual Therapy  Ezekiel Slocumb 05/12/2021, 12:57 PM

## 2021-05-12 NOTE — Progress Notes (Signed)
Physical Therapy Session Note  Patient Details  Name: Jenny Kelly MRN: [id number] Date of Birth: [birth year]  [name]: 05/12/2021 PT Individual Time: 0800-0900 PT Individual Time Calculation (min): 60 min   Short Term Goals: Week 1:  PT Short Term Goal 1 (Week 1): STGs = LTGs  Skilled Therapeutic Interventions/Progress Updates:    Pt met lying in bed w/ LPN present for morning meds. Pt agreed to therapy and supine<>sitting EOB w/ S and HOB raised. Pt requested to go to bathroom and ambulated from bed to bathroom CGA. Pt continent of bowel and bladder, requesting assistance w/ wiping. Pt encouraged to attempt on own and only able to complete partial wiping through legs in standing w/ forward trunk. MinA for remaining peri-care and ambulated back to EOB w/ CGA for rest. Sit<>stand to w/c w/ S and wheeled to ADL apartment. Pt required frequent rest breaks due to quickly fatiguing w/ activity. Pt completed bed mobility in ADL apartment and sit<>stand from recliner w/ S. Pt ambulated w/ RW and CGA to 71M rehab gym for stairs. Stairs completed x8 steps CGA w/ pt very fatigued requesting rest break. SPO2 98% during seated rest break. Wheeled to ortho gym for car transfer, TUG, and 5xSTS. Car transfer w/ CGA and setup assist. TUG-30s and 5xSTS-18s w/ use of BUE on LE due to fatigue. Pt wheeled back to room, transferred to recliner w/ S, posey belt on, call bell in hand, all needs met.   Therapy Documentation Precautions:  Precautions Precautions: Fall Precaution Comments: very HOH Restrictions Weight Bearing Restrictions: No General:    Therapy/Group: Individual Therapy  Lucile Shutters, SPT  05/12/2021, 10:00 AM

## 2021-05-12 NOTE — Discharge Summary (Addendum)
Physical Therapy Discharge Summary  Patient Details  Name: Jenny Kelly MRN: [id number] Date of Birth: [birth year]  [name]: 05/14/2021 PT Individual Time: 1962-2297; 9892-1194 PT Individual Time Calculation (min): 70 min , 60 min   Patient has met 8 of 8 long term goals due to improved activity tolerance, improved balance, improved postural control, increased strength, ability to compensate for deficits, and functional use of  left lower extremity.  Patient to discharge at an ambulatory level Supervision.   Patient's care partner is independent to provide the necessary physical and cognitive assistance at discharge.  Reasons goals not met: na  Recommendation:  Patient will benefit from ongoing skilled PT services in outpatient setting to continue to advance safe functional mobility, address ongoing impairments in dynamic balance, CV endurance, and minimize fall risk.  Equipment: No equipment provided Pt has RW and rollator at home  Reasons for discharge: treatment goals met and discharge from hospital  Patient/family agrees with progress made and goals achieved: Yes  PT Discharge Tx 1 today: pt was asleep in bed but easily awakened by her husband.  He donned her shoes in supine.  Pt was reluctant to participate due to fatigue, but did comply, given multiple seated rest breaks, and finishing session 5 min early.  Family ed with husband for simulated car transfer to sedan-ht seat, gait on level tile x 150' with RW.  Normal TUG = 21 seconds, with RW.  Dr. Adam Phenix saw pt during session; due to pt's lower abdominal pain and mid-back pain below R ribs, she will order IV fluids for kidney function and chest Xray. At end of session, husband assisting pt with toilet transfer, before she returned to bed.    Tx 2 today:  Pt on toilet with RN attending her.  Contienent of bladder.  Peri care with distant supervison.  In standing, pt managed clothing with supervision.  Hand washing at sink in  standing with supervision.  Pt declined attempting stairs, due to bil knee pain when she does stairs.  5 x STS = 20 seconds.  Need to continue using RW and watch for SOB and fatigue discussed with pt.  Dual task activity in standing on compliant mat, folding a towel with bil hands. Pt demonstrated L inattention, needing extra time to fold 1 towel; pt fatigued and SOB; needed seated rest.  O2 sats 80% on room air, rising to  88% in 1 min, up to 93% in 3 minutes.  She stood on floor x 2 minutes and folded 3 towels. Seated, pt performed Therapeutic exercise performed with bil LE to increase strength for functional mobility: 10 x 1 each R/L long arc quad knee extensions with 3 ankle pumps  at end range, q rep; she reported no knee pain.  Gait with RW x 100' on level tile, supervision; pt had to sit due to fatigue.   Pt informed RN of pt's desaturation during standing with bil UE activity.  At end of session, pt transferred to bed from wc, supervision.  She doffed shoes sitting EOB.  Supine> sit independently.  Needs left at hand and bed alarm set.   Precautions/Restrictions- falls   Vital Signs AM session Therapy Vitals Pulse Rate: 72 Oxygen Therapy SpO2: 96 % (after gait x 150') O2 Device: Room Air Pain AM session: Pain Assessment Pain Score: 7  Pain Location: R lower Abdomen, and below R ribs , mid back Pain Orientation: Right Pain Intervention(s): Medication (See eMAR) MD aware- ordering chest Xray and IV  fluids  PM session: 5/10 ; premedicated Pain Interference Pain Interference Pain Effect on Sleep: 2. Occasionally Pain Interference with Therapy Activities: 1. Rarely or not at all Pain Interference with Day-to-Day Activities: 3. Frequently Vision/Perception   Wears glasses; no change from baseline    Cognition:  A and O x 4   Sensation Sensation Light Touch: Appears Intact Hot/Cold: Not tested Proprioception: Not tested Stereognosis: Not tested Coordination Gross Motor  Movements are Fluid and Coordinated: No Fine Motor Movements are Fluid and Coordinated: Not tested Coordination and Movement Description: L hemiplegic gait, L dysmetria, mild tremor w/ fatigue; all improved since admission Heel Shin Test: NT Motor  Motor Motor: Hemiplegia Motor - Skilled Clinical Observations: L hemiplegia Motor - Discharge Observations: L hemiparesis  Mobility Bed Mobility Bed Mobility: Rolling Right;Rolling Left;Left Sidelying to Sit;Sit to Supine;Supine to Sit Rolling Right: Independent Rolling Left: Independent Supine to Sit: Independent (through R side lying) Sit to Supine: Independent Transfers Transfers: Sit to Stand;Stand to Sit;Stand Pivot Transfers Sit to Stand: Supervision/Verbal cueing Stand to Sit: Supervision/Verbal cueing Stand Pivot Transfers: Supervision/Verbal cueing Stand Pivot Transfer Details: Verbal cues for precautions/safety;Verbal cues for safe use of DME/AE Transfer (Assistive device): Rolling walker Locomotion  Gait Ambulation: Yes Gait Assistance: Set up assist;Supervision/Verbal cueing Assistive device: Rolling walker Gait Assistance Details: Verbal cues for precautions/safety;Verbal cues for safe use of DME/AE Gait Gait: Yes Gait Pattern: Decreased dorsiflexion - left;Step-through pattern;Decreased trunk rotation;Trunk flexed Stairs / Additional Locomotion Stairs: Yes Stairs Assistance: Contact Guard/Touching assist Stair Management Technique: Two rails Number of Stairs: 8 Height of Stairs: 3 Ramp: Supervision/Verbal cueing Pick up small object from the floor assist level: Contact Guard/Touching assist Pick up small object from the floor assistive device: RW Wheelchair Mobility Wheelchair Mobility: No  Trunk/Postural Assessment  Cervical Assessment Cervical Assessment: Exceptions to [facility] (forward head) Thoracic Assessment Thoracic Assessment: Exceptions to [facility] (kyphosis and rounded shoulders) Lumbar Assessment Lumbar  Assessment: Exceptions to [facility] (posterior pelvic tilt) Postural Control Postural Control: Deficits on evaluation (delayed)  Balance Balance Balance Assessed: Yes Standardized Balance Assessment Standardized Balance Assessment: Timed Up and Go Test Timed Up and Go Test TUG: Normal TUG Normal TUG (seconds): 21 (with RW) Static Sitting Balance Static Sitting - Balance Support: Feet supported Static Sitting - Level of Assistance: 7: Independent Dynamic Sitting Balance Dynamic Sitting - Balance Support: Feet supported Dynamic Sitting - Level of Assistance: 7: Independent Static Standing Balance Static Standing - Balance Support: During functional activity Static Standing - Level of Assistance: 5: Stand by assistance Dynamic Standing Balance Dynamic Standing - Balance Support: During functional activity Dynamic Standing - Level of Assistance: Other (comment) (CGA) Dynamic Standing - Balance Activities: Reaching across midline;Reaching for objects;Lateral lean/weight shifting;[place];Forward lean/weight shifting Extremity Assessment      RLE Assessment RLE Assessment: Within Functional Limits General Strength Comments: grossly 4/5 LLE Assessment LLE Assessment: Within Functional Limits General Strength Comments: grossly 4/5    Fajr Fife 05/14/2021, 12:32 PM

## 2021-05-12 NOTE — Progress Notes (Signed)
PROGRESS NOTE   Subjective/Complaints: Continues to have lower abdominal pain, right sided flank pain, dysuria, hematuria- last dose of antibiotics is tonight. Discussed with medicine whether any other workup warranted- plan for outpatient follow-up with urology  ROS: +abdominal pain, +right sided lower back pain, +dysuria  Objective:   CT ABDOMEN PELVIS W CONTRAST  Result Date: 05/11/2021 CLINICAL DATA:  UTI.  Right-sided back pain. EXAM: CT ABDOMEN AND PELVIS WITH CONTRAST TECHNIQUE: Multidetector CT imaging of the abdomen and pelvis was performed using the standard protocol following bolus administration of intravenous contrast. RADIATION DOSE REDUCTION: This exam was performed according to the departmental dose-optimization program which includes automated exposure control, adjustment of the mA and/or kV according to patient size and/or use of iterative reconstruction technique. CONTRAST:  119m OMNIPAQUE IOHEXOL 300 MG/ML  SOLN COMPARISON:  CT abdomen and pelvis 05/04/2021. CT abdomen and pelvis 03/22/2020. FINDINGS: Lower chest: Pleural effusions have resolved. There is some chronic appearing reticular opacities in both lung bases. Hepatobiliary: No focal liver abnormality is seen. There are calcified granulomas in the spleen. Status post cholecystectomy. No biliary dilatation. Pancreas: Calcifications are again seen throughout the pancreas compatible with chronic pancreatitis. There is stable pancreatic ductal dilatation throughout body tail with diffuse pancreatic atrophy. Spleen: There are calcified granulomas in the spleen. The spleen is otherwise within normal limits. Adrenals/Urinary Tract: Left-sided ureteral stent is in place. There is no hydronephrosis. 7 mm calculus in the proximal left ureter is unchanged in position. Additional punctate nonobstructing left renal calculi are unchanged. The bladder, right kidney and adrenal glands  are within normal limits. Stomach/Bowel: Stomach is within normal limits. Appendix appears normal. No evidence of bowel wall thickening, distention, or inflammatory changes. The appendix is not seen. Vascular/Lymphatic: Aortic atherosclerosis. No enlarged abdominal or pelvic lymph nodes. Reproductive: Status post hysterectomy. No adnexal masses. Other: No abdominal wall hernia or abnormality. No abdominopelvic ascites. Musculoskeletal: No acute or significant osseous findings. IMPRESSION: 1. Left ureteral stent is unchanged in position.  No hydronephrosis. 2. Stable proximal left ureteral and left renal calculi. 3. No other acute process identified. 4.  Aortic Atherosclerosis (ICD10-I70.0). 5. Findings compatible with chronic pancreatitis. There is pancreatic ductal dilatation in the body and tail similar to the prior study, but new from 2001. Please correlate clinically. Electronically Signed   By: ARonney AstersM.D.   On: 05/11/2021 15:53   No results for input(s): WBC, HGB, HCT, PLT in the last 72 hours.  No results for input(s): NA, K, CL, CO2, GLUCOSE, BUN, CREATININE, CALCIUM in the last 72 hours.   Intake/Output Summary (Last 24 hours) at 05/12/2021 1255 Last data filed at 05/12/2021 0702 Gross per 24 hour  Intake 340 ml  Output --  Net 340 ml        Physical Exam: Vital Signs Blood pressure (!) 102/53, pulse 74, temperature 98.5 F (36.9 C), resp. rate 14, height _0  (1.575 m), weight 66.6 kg, SpO2 93 %. Gen: no distress, normal appearing HEENT: oral mucosa pink and moist, NCAT Cardio: Reg rate Chest: normal effort, normal rate of breathing Abd: soft, non-distended Ext: no edema Psych: pleasant, normal affect Skin: intact Neurological:     Mental  Status: She is alert.     Cranial Nerves: No dysarthria or facial asymmetry.     Sensory: Sensation is intact.     Motor: Weakness present.     Coordination: Coordination abnormal.     Gait: Gait abnormal.     Comments: Oriented to  person time situation but not date   Motor strength is 5/5 in the right deltoid bicep tricep grip hip flexor knee extensor ankle dorsiflexor 4/5 in the left deltoid bicep tricep hip flexor knee extensor ankle dorsiflexor Left neglect is evident but can be cued to look to the left  Psychiatric:        Mood and Affect: Mood normal.  GU: Right sided flank tenderness to palpation   Assessment/Plan: 1. Functional deficits which require 3+ hours per day of interdisciplinary therapy in a comprehensive inpatient rehab setting. Physiatrist is providing close team supervision and 24 hour management of active medical problems listed below. Physiatrist and rehab team continue to assess barriers to discharge/monitor patient progress toward functional and medical goals  Care Tool:  Bathing    Body parts bathed by patient: Right arm, Left arm, Chest, Abdomen, Front perineal area, Right upper leg, Left upper leg, Face, Right lower leg, Left lower leg   Body parts bathed by helper: Buttocks     Bathing assist Assist Level: Minimal Assistance - Patient > 75%     Upper Body Dressing/Undressing Upper body dressing   What is the patient wearing?: Pull over shirt    Upper body assist Assist Level: Minimal Assistance - Patient > 75%    Lower Body Dressing/Undressing Lower body dressing      What is the patient wearing?: Underwear/pull up, Pants     Lower body assist Assist for lower body dressing: Minimal Assistance - Patient > 75%     Toileting Toileting    Toileting assist Assist for toileting: Minimal Assistance - Patient > 75%     Transfers Chair/bed transfer  Transfers assist     Chair/bed transfer assist level: Contact Guard/Touching assist     Locomotion Ambulation   Ambulation assist      Assist level: Contact Guard/Touching assist Assistive device: No Device Max distance: 90   Walk 10 feet activity   Assist     Assist level: Contact Guard/Touching  assist Assistive device: No Device   Walk 50 feet activity   Assist    Assist level: Contact Guard/Touching assist Assistive device: No Device    Walk 150 feet activity   Assist Walk 150 feet activity did not occur: Safety/medical concerns         Walk 10 feet on uneven surface  activity   Assist     Assist level: Minimal Assistance - Patient > 75% Assistive device: Other (comment) (no device)   Wheelchair     Assist Is the patient using a wheelchair?: No             Wheelchair 50 feet with 2 turns activity    Assist            Wheelchair 150 feet activity     Assist          Blood pressure (!) 102/53, pulse 74, temperature 98.5 F (36.9 C), resp. rate 14, height _0  (1.575 m), weight 66.6 kg, SpO2 93 %.    Medical Problem List and Plan: 1. 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  2.  Antithrombotics: -DVT/anticoagulation:  Pharmaceutical: Xarelto             -antiplatelet therapy: Aspirin 81 mg/day 3. Chronic headaches: Tylenol, continue pregabalin 50 mg twice daily, Topamax 200 mg twice daily. Add fioricet prn.  4. Mood: Monitor, consider neuropsych if depressed mood             -antipsychotic agents: None used at the current time   5. Neuropsych: This patient is capable of making decisions on her own behalf. 6. Skin/Wound Care: Monitor skin, turn every 2 hours 7. Fluids/Electrolytes/Nutrition: Routine I's and O's, check be met in a.m. 8.  UTI E. coli Pyelonephritis with stents for left hydronephrosis related to ureteral stone, was septic and treated with IV antibiotics, restarted keflex as developed new pain with urination and UA was positive, Keflex started 1/18, UC with no growth, antibiotics and probiotics to stop today  9.  Loose  stool: d/c colace. Decrease magnesium to 250 mg HS 10.  Cognitive deficit versus pre-existing continued Aricept 10 mg nightly 11.  COPD continue guaifenesin as needed as well as Proventil as needed and Breo elliptica 200/25 MCG  1 puff daily, Singulair 10 mg daily, umeclidinium bromide 62.5 MCG 1 puff daily at 1530 12.  Pulmonary fibrosis post COVID, continue 1 to 2 L of oxygen, did not use oxygen at home prior to hospitalization #13.  Hyperlipidemia continue pravastatin 40 mg/day 14.  Dysphagia stroke continue D2 diet follow-up with speech therapy 15.  CODE STATUS is partial, no CPR, no defibrillation 16. Hypotension: medications reviewed and on noting that should significantly lower BP.  17. Right sided flank pain: given UTI and prior hydronephrosis, ordered CT abdomen/pelvis- discussed with patient that results are stable      LOS: 5 days A FACE TO FACE EVALUATION WAS PERFORMED  Jenny Kelly 05/12/2021, 12:55 PM

## 2021-05-13 NOTE — Progress Notes (Signed)
Occupational Therapy Session Note  Patient Details  Name: Jenny Kelly MRN: [id number] Date of Birth: 1945/08/06  [name]: 05/13/2021 OT Individual Time: 6720-9470 OT Individual Time Calculation (min): 31 min    Short Term Goals: Week 1:  OT Short Term Goal 1 (Week 1): STG = LTG 2/2 ELOS  Skilled Therapeutic Interventions/Progress Updates:    Pt received asleep in bed with husband present, no c/o pain, agreeable to therapy with encouragement. Session focus on self-care retraining, activity tolerance, LUE NMR, transfer retraining in prep for improved ADL/IADL/func mobility performance + decreased caregiver burden. Came to sitting EOB with use of bed rail and increased time. Ambulatory toilet transfer with light CGA and RW. Continent void of bladder, light min A for clothing management as pt to be wearing brief + underwear. Washed hands at sink with close S.   Seated EOB, completed UB dressing with set-up A, LB dressing with min A for error correction (pt had placed both BLE into one pant leg with delayed awareness.) Pt then returned to supine with ind.   Issued soft tan theraputty and pt returned demonstration of the follow therex with L hand: key pinch, pincer grasp, putty squeezes, and pinch and pull. Discussed additional [facility] activities to practice with at home, as well.  Pt left semi-reclined in bed with bed alarm engaged, call bell in reach, and all immediate needs met.    Therapy Documentation Precautions:  Precautions Precautions: Fall Precaution Comments: HOH Restrictions Weight Bearing Restrictions: No  Pain:  No c/o throughout ADL: See Care Tool for more details.     Therapy/Group: Individual Therapy  Volanda Napoleon MS, OTR/L  05/13/2021, 6:49 AM

## 2021-05-13 NOTE — Progress Notes (Signed)
PROGRESS NOTE   Subjective/Complaints: Discussed results of abdominal CT with patient and husband. Recommended calling urology to schedule hospital follow-up on Monday given continued abdominal pain  ROS: +abdominal pain, +right sided lower back pain, +dysuria, +hematuria  Objective:   CT ABDOMEN PELVIS W CONTRAST  Result Date: 05/11/2021 CLINICAL DATA:  UTI.  Right-sided back pain. EXAM: CT ABDOMEN AND PELVIS WITH CONTRAST TECHNIQUE: Multidetector CT imaging of the abdomen and pelvis was performed using the standard protocol following bolus administration of intravenous contrast. RADIATION DOSE REDUCTION: This exam was performed according to the departmental dose-optimization program which includes automated exposure control, adjustment of the mA and/or kV according to patient size and/or use of iterative reconstruction technique. CONTRAST:  136mL OMNIPAQUE IOHEXOL 300 MG/ML  SOLN COMPARISON:  CT abdomen and pelvis 05/04/2021. CT abdomen and pelvis 03/22/2020. FINDINGS: Lower chest: Pleural effusions have resolved. There is some chronic appearing reticular opacities in both lung bases. Hepatobiliary: No focal liver abnormality is seen. There are calcified granulomas in the spleen. Status post cholecystectomy. No biliary dilatation. Pancreas: Calcifications are again seen throughout the pancreas compatible with chronic pancreatitis. There is stable pancreatic ductal dilatation throughout body tail with diffuse pancreatic atrophy. Spleen: There are calcified granulomas in the spleen. The spleen is otherwise within normal limits. Adrenals/Urinary Tract: Left-sided ureteral stent is in place. There is no hydronephrosis. 7 mm calculus in the proximal left ureter is unchanged in position. Additional punctate nonobstructing left renal calculi are unchanged. The bladder, right kidney and adrenal glands are within normal limits. Stomach/Bowel: Stomach is  within normal limits. Appendix appears normal. No evidence of bowel wall thickening, distention, or inflammatory changes. The appendix is not seen. Vascular/Lymphatic: Aortic atherosclerosis. No enlarged abdominal or pelvic lymph nodes. Reproductive: Status post hysterectomy. No adnexal masses. Other: No abdominal wall hernia or abnormality. No abdominopelvic ascites. Musculoskeletal: No acute or significant osseous findings. IMPRESSION: 1. Left ureteral stent is unchanged in position.  No hydronephrosis. 2. Stable proximal left ureteral and left renal calculi. 3. No other acute process identified. 4.  Aortic Atherosclerosis (ICD10-I70.0). 5. Findings compatible with chronic pancreatitis. There is pancreatic ductal dilatation in the body and tail similar to the prior study, but new from 2001. Please correlate clinically. Electronically Signed   By: Ronney Asters M.D.   On: 05/11/2021 15:53   Recent Labs    05/12/21 1545  WBC 9.8  HGB 10.3*  HCT 34.5*  PLT 295    No results for input(s): NA, K, CL, CO2, GLUCOSE, BUN, CREATININE, CALCIUM in the last 72 hours.   Intake/Output Summary (Last 24 hours) at 05/13/2021 1402 Last data filed at 05/12/2021 1814 Gross per 24 hour  Intake 120 ml  Output --  Net 120 ml        Physical Exam: Vital Signs Blood pressure 107/64, pulse 66, temperature 99.7 F (37.6 C), temperature source Oral, resp. rate 18, height $RemoveBe'5\' 2"'FQpfpqvCA$  (1.575 m), weight 66.6 kg, SpO2 97 %. Gen: no distress, normal appearing HEENT: oral mucosa pink and moist, NCAT Cardio: Reg rate Chest: normal effort, normal rate of breathing Abd: soft, non-distended Ext: no edema Psych: pleasant, normal affect Skin: intact Neurological:  Mental Status: She is alert.     Cranial Nerves: No dysarthria or facial asymmetry.     Sensory: Sensation is intact.     Motor: Weakness present.     Coordination: Coordination abnormal.     Gait: Gait abnormal.     Comments: Oriented to person time  situation but not date   Motor strength is 5/5 in the right deltoid bicep tricep grip hip flexor knee extensor ankle dorsiflexor 4/5 in the left deltoid bicep tricep hip flexor knee extensor ankle dorsiflexor Left neglect is evident but can be cued to look to the left  Psychiatric:        Mood and Affect: Mood normal.  GU: Right sided flank tenderness to palpation   Assessment/Plan: 1. Functional deficits which require 3+ hours per day of interdisciplinary therapy in a comprehensive inpatient rehab setting. Physiatrist is providing close team supervision and 24 hour management of active medical problems listed below. Physiatrist and rehab team continue to assess barriers to discharge/monitor patient progress toward functional and medical goals  Care Tool:  Bathing    Body parts bathed by patient: Right arm, Left arm, Chest, Abdomen, Front perineal area, Right upper leg, Left upper leg, Face, Right lower leg, Left lower leg   Body parts bathed by helper: Buttocks     Bathing assist Assist Level: Minimal Assistance - Patient > 75%     Upper Body Dressing/Undressing Upper body dressing   What is the patient wearing?: Pull over shirt    Upper body assist Assist Level: Minimal Assistance - Patient > 75%    Lower Body Dressing/Undressing Lower body dressing      What is the patient wearing?: Underwear/pull up, Pants     Lower body assist Assist for lower body dressing: Minimal Assistance - Patient > 75%     Toileting Toileting    Toileting assist Assist for toileting: Minimal Assistance - Patient > 75%     Transfers Chair/bed transfer  Transfers assist     Chair/bed transfer assist level: Contact Guard/Touching assist     Locomotion Ambulation   Ambulation assist      Assist level: Contact Guard/Touching assist Assistive device: No Device Max distance: 90   Walk 10 feet activity   Assist     Assist level: Contact Guard/Touching assist Assistive  device: No Device   Walk 50 feet activity   Assist    Assist level: Contact Guard/Touching assist Assistive device: No Device    Walk 150 feet activity   Assist Walk 150 feet activity did not occur: Safety/medical concerns         Walk 10 feet on uneven surface  activity   Assist     Assist level: Minimal Assistance - Patient > 75% Assistive device: Other (comment) (no device)   Wheelchair     Assist Is the patient using a wheelchair?: No             Wheelchair 50 feet with 2 turns activity    Assist            Wheelchair 150 feet activity     Assist          Blood pressure 107/64, pulse 66, temperature 99.7 F (37.6 C), temperature source Oral, resp. rate 18, height $RemoveBe'5\' 2"'fySEhUKjw$  (1.575 m), weight 66.6 kg, SpO2 97 %.    Medical Problem List and Plan: 1. 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  2.  Antithrombotics: -DVT/anticoagulation:  Pharmaceutical: Xarelto             -antiplatelet therapy: Aspirin 81 mg/day 3. Chronic headaches: Tylenol, continue pregabalin 50 mg twice daily, Topamax 200 mg twice daily. Add fioricet prn.  4. Mood: Monitor, consider neuropsych if depressed mood             -antipsychotic agents: None used at the current time   5. Neuropsych: This patient is capable of making decisions on her own behalf. 6. Skin/Wound Care: Monitor skin, turn every 2 hours 7. Fluids/Electrolytes/Nutrition: Routine I's and O's, check be met in a.m. 8.  UTI E. coli Pyelonephritis with stents for left hydronephrosis related to ureteral stone, was septic and treated with IV antibiotics, restarted keflex as developed new pain with urination and UA was positive, Keflex started 1/18, UC with no growth, antibiotics and probiotics to stop today  9.  Loose  stool: d/c colace. Decrease magnesium to 250 mg HS 10.  Cognitive deficit versus pre-existing continued Aricept 10 mg nightly 11.  COPD continue guaifenesin as needed as well as Proventil as needed and Breo elliptica 200/25 MCG  1 puff daily, Singulair 10 mg daily, umeclidinium bromide 62.5 MCG 1 puff daily at 1530 12.  Pulmonary fibrosis post COVID, continue 1 to 2 L of oxygen, did not use oxygen at home prior to hospitalization #13.  Hyperlipidemia continue pravastatin 40 mg/day 14.  Dysphagia stroke continue D2 diet follow-up with speech therapy 15.  CODE STATUS is partial, no CPR, no defibrillation 16. Hypotension: improved, continue to monitor, medications reviewed and on noting that should significantly lower BP.  17. Right sided flank pain: given UTI and prior hydronephrosis, ordered CT abdomen/pelvis- discussed with patient that results are stable. Improved. Recommended calling urology on Monday to set up outpatient follow-up.  18. Hematuria: consulted medicine, plan for outpatient follow-up with urology. Hgb repeated and improving.     LOS: 6 days A FACE TO FACE EVALUATION WAS PERFORMED  Martha Clan P Samie Barclift 05/13/2021, 2:02 PM

## 2021-05-14 ENCOUNTER — Inpatient Hospital Stay (HOSPITAL_COMMUNITY): Payer: Medicare HMO

## 2021-05-14 LAB — CBC WITH DIFFERENTIAL/PLATELET
Abs Immature Granulocytes: 0.08 10*3/uL — ABNORMAL HIGH (ref 0.00–0.07)
Basophils Absolute: 0.1 10*3/uL (ref 0.0–0.1)
Basophils Relative: 1 %
Eosinophils Absolute: 0.5 10*3/uL (ref 0.0–0.5)
Eosinophils Relative: 5 %
HCT: 32.2 % — ABNORMAL LOW (ref 36.0–46.0)
Hemoglobin: 9.8 g/dL — ABNORMAL LOW (ref 12.0–15.0)
Immature Granulocytes: 1 %
Lymphocytes Relative: 28 %
Lymphs Abs: 2.6 10*3/uL (ref 0.7–4.0)
MCH: 27.5 pg (ref 26.0–34.0)
MCHC: 30.4 g/dL (ref 30.0–36.0)
MCV: 90.2 fL (ref 80.0–100.0)
Monocytes Absolute: 0.6 10*3/uL (ref 0.1–1.0)
Monocytes Relative: 7 %
Neutro Abs: 5.4 10*3/uL (ref 1.7–7.7)
Neutrophils Relative %: 58 %
Platelets: 275 10*3/uL (ref 150–400)
RBC: 3.57 MIL/uL — ABNORMAL LOW (ref 3.87–5.11)
RDW: 19.8 % — ABNORMAL HIGH (ref 11.5–15.5)
WBC: 9.2 10*3/uL (ref 4.0–10.5)
nRBC: 0 % (ref 0.0–0.2)

## 2021-05-14 LAB — PROCALCITONIN: Procalcitonin: 0.14 ng/mL

## 2021-05-14 MED ORDER — SODIUM CHLORIDE 0.9 % IV SOLN: INTRAVENOUS | Status: AC

## 2021-05-14 NOTE — Progress Notes (Signed)
PROGRESS NOTE   Subjective/Complaints: Complains of shortness of breath and right sided rib pain, fatigue- will get CXR Asks about kidney function: discussed that it has been improving  ROS: +abdominal pain, +right sided lower back pain, +dysuria, +hematuria, +shortness of breath  Objective:   No results found. Recent Labs    05/12/21 1545  WBC 9.8  HGB 10.3*  HCT 34.5*  PLT 295    No results for input(s): NA, K, CL, CO2, GLUCOSE, BUN, CREATININE, CALCIUM in the last 72 hours.   Intake/Output Summary (Last 24 hours) at 05/14/2021 1434 Last data filed at 05/14/2021 1345 Gross per 24 hour  Intake 390 ml  Output --  Net 390 ml        Physical Exam: Vital Signs Blood pressure 96/67, pulse 72, temperature 98.7 F (37.1 C), temperature source Oral, resp. rate 18, height $RemoveBe'5\' 2"'NGszrtihr$  (1.575 m), weight 66.6 kg, SpO2 96 %. Gen: no distress, normal appearing HEENT: oral mucosa pink and moist, NCAT Cardio: Reg rate Chest: normal effort, normal rate of breathing Abd: soft, non-distended Ext: no edema Psych: pleasant, normal affect Skin: intact Neurological:     Mental Status: She is alert.     Cranial Nerves: No dysarthria or facial asymmetry.     Sensory: Sensation is intact.     Motor: Weakness present.     Coordination: Coordination abnormal.     Gait: Gait abnormal.     Comments: Oriented to person time situation but not date   Motor strength is 5/5 in the right deltoid bicep tricep grip hip flexor knee extensor ankle dorsiflexor 4/5 in the left deltoid bicep tricep hip flexor knee extensor ankle dorsiflexor Left neglect is evident but can be cued to look to the left  Psychiatric:        Mood and Affect: Mood normal.  GU: Right sided flank tenderness to palpation   Assessment/Plan: 1. Functional deficits which require 3+ hours per day of interdisciplinary therapy in a comprehensive inpatient rehab  setting. Physiatrist is providing close team supervision and 24 hour management of active medical problems listed below. Physiatrist and rehab team continue to assess barriers to discharge/monitor patient progress toward functional and medical goals  Care Tool:  Bathing    Body parts bathed by patient: Right arm, Left arm, Right lower leg, Left lower leg, Chest, Abdomen, Front perineal area, Buttocks, Right upper leg, Left upper leg, Face   Body parts bathed by helper: Buttocks     Bathing assist Assist Level: Set up assist     Upper Body Dressing/Undressing Upper body dressing   What is the patient wearing?: Pull over shirt (x2)    Upper body assist Assist Level: Set up assist    Lower Body Dressing/Undressing Lower body dressing      What is the patient wearing?: Underwear/pull up, Pants     Lower body assist Assist for lower body dressing: Set up assist     Toileting Toileting    Toileting assist Assist for toileting: Supervision/Verbal cueing     Transfers Chair/bed transfer  Transfers assist     Chair/bed transfer assist level: Supervision/Verbal cueing     Locomotion Ambulation  Ambulation assist      Assist level: Supervision/Verbal cueing Assistive device: Walker-rolling Max distance: 150   Walk 10 feet activity   Assist     Assist level: Supervision/Verbal cueing Assistive device: Walker-rolling   Walk 50 feet activity   Assist    Assist level: Supervision/Verbal cueing Assistive device: Walker-rolling    Walk 150 feet activity   Assist Walk 150 feet activity did not occur: Safety/medical concerns  Assist level: Supervision/Verbal cueing Assistive device: Walker-rolling    Walk 10 feet on uneven surface  activity   Assist     Assist level: Contact Guard/Touching assist Assistive device: Walker-rolling   Wheelchair     Assist Is the patient using a wheelchair?: Yes (transport to therapy room due to  fatigue) Type of Wheelchair: Manual Wheelchair activity did not occur: Refused (fatigue)         Wheelchair 50 feet with 2 turns activity    Assist            Wheelchair 150 feet activity     Assist          Blood pressure 96/67, pulse 72, temperature 98.7 F (37.1 C), temperature source Oral, resp. rate 18, height $RemoveBe'5\' 2"'oOzZQIlQE$  (1.575 m), weight 66.6 kg, SpO2 96 %.    Medical Problem List and Plan: 1. 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  2.  Antithrombotics: -DVT/anticoagulation:  Pharmaceutical: Xarelto             -antiplatelet therapy: Aspirin 81 mg/day 3. Chronic headaches: Tylenol, continue pregabalin 50 mg twice daily, Topamax 200 mg twice daily. Add fioricet prn.  4. Mood: Monitor, consider neuropsych if depressed mood             -antipsychotic agents: None used at the current time   5. Neuropsych: This patient is capable of making decisions on her own behalf. 6. Skin/Wound Care: Monitor skin, turn every 2 hours 7. Fluids/Electrolytes/Nutrition: Routine I's and O's, check be met in a.m. 8.  UTI E. coli Pyelonephritis with stents for left hydronephrosis related to ureteral stone, was septic and treated with IV antibiotics, restarted keflex as developed new pain with urination and UA was positive, Keflex started 1/18, UC with no growth, antibiotics and probiotics completed. Discussed kidney function improving, IVF ordered until tomorrow AM. Repeat creatinine tomorrow.  9.  Loose stool: d/c colace. D/c magnesium 10.  Cognitive deficit versus pre-existing continued Aricept 10 mg nightly 11.  COPD continue guaifenesin as needed as well as Proventil as needed and Breo elliptica 200/25 MCG  1 puff daily, Singulair 10 mg daily, umeclidinium bromide 62.5 MCG 1 puff daily at  1530 12.  Pulmonary fibrosis post COVID, continue 1 to 2 L of oxygen, did not use oxygen at home prior to hospitalization #13.  Hyperlipidemia continue pravastatin 40 mg/day 14.  Dysphagia stroke continue D2 diet follow-up with speech therapy 15.  CODE STATUS is partial, no CPR, no defibrillation 16. Hypotension: improved, continue to monitor, medications reviewed and on noting that should significantly lower BP.  17. Right sided flank pain: given UTI and prior hydronephrosis, ordered CT abdomen/pelvis- discussed with patient that results are stable. Improved. Recommended calling urology on Monday to set up  outpatient follow-up.  18. Hematuria: consulted medicine, plan for outpatient follow-up with urology. Hgb repeated and improving. 19. Shortness of breath: CXR ordered.      LOS: 7 days A FACE TO FACE EVALUATION WAS PERFORMED  Izora Ribas 05/14/2021, 2:34 PM

## 2021-05-14 NOTE — Progress Notes (Signed)
Occupational Therapy Session Note  Patient Details  Name: Jenny Kelly MRN: [id number] Date of Birth: 26-Mar-1946  [name]: 05/14/2021 OT Individual Time: 0915-1010 OT Individual Time Calculation (min): 55 min    Short Term Goals: Week 1:  OT Short Term Goal 1 (Week 1): STG = LTG 2/2 ELOS   Skilled Therapeutic Interventions/Progress Updates:    1:1 Pt was in bed sleeping when arrived. Pt's husband present for session.  Grad day: Self care retraining. Pt came to EOB without rail I. Pt transferred into w/c with supervision with RW. Pt chose to bathe at the sink today with clothing items laid out on the bedside table on her left. Pt bathed with supervision. Pt did require frequent rest breaks due to fatigue. Pt dressed self with supervision as well. Pt's left hearing aid was not working so she was having difficulty with hearing throughout session. Pt ambulated to the bathroom with close supervision without RW (short distance and performed toileting with close supervision. Husband reports feeling comfortable with her performance with ADLs and reports she has had SOB and fatigue with simple tasks since she had COVID. Pt ambulated from sink to bed with close supervision without AD and got into bed I.   O2 initially was at 86% after laying down in bed but with a few min returned to 97%   Therapy Documentation Precautions:  Precautions Precautions: Fall Precaution Comments: HOH Restrictions Weight Bearing Restrictions: No General:   Vital Signs: Therapy Vitals Pulse Rate: 72 Oxygen Therapy SpO2: 96 % (after gait x 150') O2 Device: Room Air Pain: C/o pain groin area- applied hand heat pack and laid down at end of session to rest. Offered rest breaks through out sessions ADL: ADL Eating: Set up Where Assessed-Eating: Bed level Grooming: Supervision/safety Where Assessed-Grooming: Sitting at sink Upper Body Bathing: Supervision/safety Where Assessed-Upper Body Bathing: Sitting  at sink (unable to shower secondary to PICC) Lower Body Bathing: Supervision/safety Where Assessed-Lower Body Bathing: Sitting at sink, Standing at sink Upper Body Dressing: Setup Where Assessed-Upper Body Dressing: Sitting at sink Lower Body Dressing: Supervision/safety Where Assessed-Lower Body Dressing: Sitting at sink, Standing at sink Toileting: Supervision/safety Where Assessed-Toileting: Teacher, adult education: Close supervision Toilet Transfer Method: Ambulating, Stand pivot Toilet Transfer Equipment: Grab bars Tub/Shower Transfer: Close supervison Web designer Method: Ship broker: Shower seat with back ADL Comments: Cues for attention to L    Therapy/Group: Individual Therapy  Roney Mans [facility] 05/14/2021, 12:10 PM

## 2021-05-15 LAB — BASIC METABOLIC PANEL
Anion gap: 5 (ref 5–15)
BUN: 26 mg/dL — ABNORMAL HIGH (ref 8–23)
CO2: 20 mmol/L — ABNORMAL LOW (ref 22–32)
Calcium: 8 mg/dL — ABNORMAL LOW (ref 8.9–10.3)
Chloride: 114 mmol/L — ABNORMAL HIGH (ref 98–111)
Creatinine, Ser: 1.52 mg/dL — ABNORMAL HIGH (ref 0.44–1.00)
GFR, Estimated: 36 mL/min — ABNORMAL LOW (ref >60)
Glucose, Bld: 120 mg/dL — ABNORMAL HIGH (ref 70–99)
Potassium: 3.6 mmol/L (ref 3.5–5.1)
Sodium: 139 mmol/L (ref 135–145)

## 2021-05-15 LAB — VITAMIN D 25 HYDROXY (VIT D DEFICIENCY, FRACTURES): Vit D, 25-Hydroxy: 66.57 ng/mL (ref 30–100)

## 2021-05-15 MED ORDER — SODIUM CHLORIDE 0.9 % IV SOLN: INTRAVENOUS | Status: DC

## 2021-05-15 MED ORDER — PANCRELIPASE (LIP-PROT-AMYL) 12000-38000 UNITS PO CPEP
12000.0000 [IU] | ORAL_CAPSULE | Freq: Three times a day (TID) | ORAL | Status: DC
Start: 2021-05-15 — End: 2021-05-16
  Administered 2021-05-15 – 2021-05-16 (×3): 12000 [IU] via ORAL
  Filled 2021-05-15 (×5): qty 1

## 2021-05-15 NOTE — Progress Notes (Addendum)
MD cancelled labs for today - treating pt with replacements and fluid - has placed an order for labs in am.

## 2021-05-15 NOTE — Progress Notes (Signed)
PROGRESS NOTE   Subjective/Complaints: Discussed elevated creatinine level today- continued fluids and recheck at noon as patient would still really like to go home today. Recommended calling Dr. Jackson Latino office to set up outpatient appointment  ROS: +abdominal pain- improved, +right sided lower back pain, +dysuria, +hematuria, +shortness of breath  Objective:   DG Chest 2 View  Result Date: 05/14/2021 CLINICAL DATA:  Right-sided rib pain. EXAM: CHEST - 2 VIEW COMPARISON:  05/02/2021 FINDINGS: The lungs are clear without focal pneumonia, edema, pneumothorax or pleural effusion. Interstitial markings are diffusely coarsened with chronic features. The cardio pericardial silhouette is enlarged. Right PICC line tip overlies the mid SVC level. Bones are diffusely demineralized. IMPRESSION: No active cardiopulmonary disease. Similar appearance diffuse interstitial opacity, likely chronic. Electronically Signed   By: Misty Stanley M.D.   On: 05/14/2021 16:19   Recent Labs    05/12/21 1545 05/14/21 2040  WBC 9.8 9.2  HGB 10.3* 9.8*  HCT 34.5* 32.2*  PLT 295 275    Recent Labs    05/15/21 0337  NA 139  K 3.6  CL 114*  CO2 20*  GLUCOSE 120*  BUN 26*  CREATININE 1.52*  CALCIUM 8.0*     Intake/Output Summary (Last 24 hours) at 05/15/2021 1016 Last data filed at 05/15/2021 0809 Gross per 24 hour  Intake 1489.54 ml  Output --  Net 1489.54 ml        Physical Exam: Vital Signs Blood pressure (!) 106/53, pulse 67, temperature 97.9 F (36.6 C), temperature source Oral, resp. rate 16, height 5' 2" (1.575 m), weight 68.2 kg, SpO2 96 %. Gen: no distress, normal appearing HEENT: oral mucosa pink and moist, NCAT Cardio: Reg rate Chest: normal effort, normal rate of breathing Abd: soft, non-distended Ext: no edema Psych: pleasant, normal affect Skin: intact Neurological:     Mental Status: She is alert.     Cranial Nerves: No  dysarthria or facial asymmetry.     Sensory: Sensation is intact.     Motor: Weakness present.     Coordination: Coordination abnormal.     Gait: Gait abnormal.     Comments: Oriented to person time situation but not date   Motor strength is 5/5 in the right deltoid bicep tricep grip hip flexor knee extensor ankle dorsiflexor 4/5 in the left deltoid bicep tricep hip flexor knee extensor ankle dorsiflexor Left neglect is evident but can be cued to look to the left  Psychiatric:        Mood and Affect: Mood normal.  GU: Right sided flank tenderness to palpation   Assessment/Plan: 1. Functional deficits which require 3+ hours per day of interdisciplinary therapy in a comprehensive inpatient rehab setting. Physiatrist is providing close team supervision and 24 hour management of active medical problems listed below. Physiatrist and rehab team continue to assess barriers to discharge/monitor patient progress toward functional and medical goals  Care Tool:  Bathing    Body parts bathed by patient: Right arm, Left arm, Right lower leg, Left lower leg, Chest, Abdomen, Front perineal area, Buttocks, Right upper leg, Left upper leg, Face   Body parts bathed by helper: Buttocks     Bathing  Assist Level: Set up assist °  °  °Upper Body Dressing/Undressing °Upper body dressing   °What is the patient wearing?: Pull over shirt (x2) °   °Upper body assist Assist Level: Set up assist °   °Lower Body Dressing/Undressing °Lower body dressing ° ° °   °What is the patient wearing?: Underwear/pull up, Pants ° °  ° °Lower body assist Assist for lower body dressing: Set up assist °   ° °Toileting °Toileting    °Toileting assist Assist for toileting: Supervision/Verbal cueing °  °  °Transfers °Chair/bed transfer ° °Transfers assist °   ° °Chair/bed transfer assist level: Supervision/Verbal cueing °  °  °Locomotion °Ambulation ° ° °Ambulation assist ° °   ° °Assist level: Supervision/Verbal  cueing °Assistive device: Walker-rolling °Max distance: 150  ° °Walk 10 feet activity ° ° °Assist °   ° °Assist level: Supervision/Verbal cueing °Assistive device: Walker-rolling  ° °Walk 50 feet activity ° ° °Assist   ° °Assist level: Supervision/Verbal cueing °Assistive device: Walker-rolling  ° ° °Walk 150 feet activity ° ° °Assist Walk 150 feet activity did not occur: Safety/medical concerns ° °Assist level: Supervision/Verbal cueing °Assistive device: Walker-rolling °  ° °Walk 10 feet on uneven surface  °activity ° ° °Assist   ° ° °Assist level: Contact Guard/Touching assist °Assistive device: Walker-rolling  ° °Wheelchair ° ° ° ° °Assist Is the patient using a wheelchair?: Yes (transport to therapy room due to fatigue) °Type of Wheelchair: Manual °Wheelchair activity did not occur: Refused (fatigue) ° °  °   ° ° °Wheelchair 50 feet with 2 turns activity ° ° ° °Assist ° °  °  ° ° °   ° °Wheelchair 150 feet activity  ° ° ° °Assist °   ° ° °   ° °Blood pressure (!) 106/53, pulse 67, temperature 97.9 °F (36.6 °C), temperature source Oral, resp. rate 16, height 5' 2" (1.575 m), weight 68.2 kg, SpO2 96 %. ° ° ° Medical Problem List and Plan: °1. 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  ° HFU scheduled with me 3/7 ° Continue multivitamin.  °2.  Antithrombotics: °-DVT/anticoagulation:  Pharmaceutical: Xarelto °            -antiplatelet therapy: Aspirin 81 mg/day °3. Chronic headaches: Tylenol, continue pregabalin 50 mg twice daily, Topamax 200 mg twice daily. Add fioricet prn.  °4. Mood: Monitor, consider neuropsych if depressed mood °            -antipsychotic agents: None used at the current time   °5. Neuropsych: This patient is capable of making decisions on her own behalf. °6. Skin/Wound Care: Monitor skin, turn every 2 hours °7.  Fluids/Electrolytes/Nutrition: Routine I's and O's, check be met in a.m. °8.  UTI E. coli Pyelonephritis with stents for left hydronephrosis related to ureteral stone, was septic and treated with IV antibiotics, restarted keflex as developed new pain with urination and UA was positive, Keflex started 1/18, UC with no growth, antibiotics and probiotics completed. Discussed kidney function improving, IVF ordered until tomorrow AM. Repeat creatinine tomorrow.  °9.  Loose stool: d/c colace. D/c magnesium °10.  Cognitive deficit versus pre-existing continued Aricept 10 mg nightly °11.  COPD continue   continue guaifenesin as needed as well as Proventil as needed and Breo elliptica 200/25 MCG  1 puff daily, Singulair 10 mg daily, umeclidinium bromide 62.5 MCG 1 puff daily at 1530 12.  Pulmonary fibrosis post COVID, continue 1 to 2 L of oxygen, did not use oxygen at home prior to hospitalization #13.  Hyperlipidemia continue pravastatin 40 mg/day 14.  Dysphagia stroke continue D2 diet follow-up with speech therapy 15.  CODE STATUS is partial, no CPR, no defibrillation 16. Hypotension: improved, continue to monitor, medications reviewed and on noting that should significantly lower BP.  17. Right sided flank pain: given UTI and prior hydronephrosis, ordered CT abdomen/pelvis- discussed with patient that results are stable. Improved. Recommended calling urology on Monday to set up outpatient follow-up. Repeat creatinine elevated today- give fluids and recheck at noon.  18. Hematuria: consulted medicine, plan for outpatient follow-up with urology. Hgb repeated and stable, discussed with patient and husband. 19. Shortness of breath: CXR ordered and is stable. Continue IS   >30 minutes spent in discharge of patient including review of medications and follow-up appointments, physical examination, and in answering all patient's questions     LOS: 8 days A FACE TO FACE EVALUATION WAS [place] 05/15/2021,  10:16 AM

## 2021-05-15 NOTE — Progress Notes (Signed)
I discussed patient with Dr. Lovena Neighbours. He recommended repeating culture to see if it was positive for infection.  He did indicate that suprapubic pain, pyuria, bacteruria  were  common with stents and antibiotics not indicated if she is not spiking fevers and no elevation in WBC noted. He plans seeing her in the office and setting her up for stone extraction in the future.

## 2021-05-15 NOTE — Progress Notes (Incomplete)
Patient refusesd SCD educated related to purpose of treatment and states she understand, bu prefers not to wears them,Continue IVF @ 50 cc/hr site unremarkable   2200 Staff assisted patient to BR with walker and tolerated well,some SOB noted patient denies respiratory discomfort. Monitor and assisted,,occasional productive cough noted clear sputum

## 2021-05-15 NOTE — Progress Notes (Signed)
Followed up with patient and husband re: elevation in BUN/SCr today and need for IVF for hydration. Discussed labs and recent CT results. Pain is better--she indicates flank and lower abdominal pain (suprapubic area) still GU related. 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  He does endorse having ongoing diarrhea since Covid and history of pancreatitis. No RUE, abdominal pain or dyspepsia. 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 Reached out to Dr. Lovena Neighbours for input on patient's ongoing symptoms. UA/UCS from 01/18 showed moderate clumped WBC few bacteria but negative for nitrites w/negative culture.

## 2021-05-15 NOTE — Progress Notes (Signed)
Patient ID: Jenny Kelly, female   DOB: Aug 29, 1945, 76 y.o.   MRN: [id number]  According to MD/PA pt is not discharging today due to medical issues.

## 2021-05-16 ENCOUNTER — Telehealth: Payer: Self-pay

## 2021-05-16 ENCOUNTER — Other Ambulatory Visit: Payer: Self-pay | Admitting: Physical Medicine and Rehabilitation

## 2021-05-16 DIAGNOSIS — K8689 Other specified diseases of pancreas: Secondary | ICD-10-CM

## 2021-05-16 LAB — BASIC METABOLIC PANEL
Anion gap: 6 (ref 5–15)
BUN: 19 mg/dL (ref 8–23)
CO2: 21 mmol/L — ABNORMAL LOW (ref 22–32)
Calcium: 8.2 mg/dL — ABNORMAL LOW (ref 8.9–10.3)
Chloride: 113 mmol/L — ABNORMAL HIGH (ref 98–111)
Creatinine, Ser: 1.48 mg/dL — ABNORMAL HIGH (ref 0.44–1.00)
GFR, Estimated: 37 mL/min — ABNORMAL LOW (ref >60)
Glucose, Bld: 104 mg/dL — ABNORMAL HIGH (ref 70–99)
Potassium: 3.6 mmol/L (ref 3.5–5.1)
Sodium: 140 mmol/L (ref 135–145)

## 2021-05-16 MED ORDER — TOPIRAMATE 200 MG PO TABS: 200.0000 mg | ORAL_TABLET | Freq: Two times a day (BID) | ORAL | 0 refills | Status: DC

## 2021-05-16 MED ORDER — ASPIRIN 81 MG PO TBEC
81.0000 mg | DELAYED_RELEASE_TABLET | Freq: Every day | ORAL | 0 refills | Status: AC
Start: 2021-05-16 — End: ?

## 2021-05-16 MED ORDER — ERGOCALCIFEROL 1.25 MG (50000 UT) PO CAPS: 50000.0000 [IU] | ORAL_CAPSULE | ORAL | 0 refills | Status: DC

## 2021-05-16 MED ORDER — OMEPRAZOLE 40 MG PO CPDR: 40.0000 mg | DELAYED_RELEASE_CAPSULE | Freq: Every day | ORAL | 0 refills | Status: DC

## 2021-05-16 MED ORDER — MONTELUKAST SODIUM 10 MG PO TABS: 10.0000 mg | ORAL_TABLET | Freq: Every day | ORAL | 0 refills | Status: DC

## 2021-05-16 MED ORDER — ADULT MULTIVITAMIN W/MINERALS CH
1.0000 | ORAL_TABLET | Freq: Every day | ORAL | Status: DC
Start: 2021-05-17 — End: 2022-06-18

## 2021-05-16 MED ORDER — DONEPEZIL HCL 10 MG PO TABS: 10.0000 mg | ORAL_TABLET | Freq: Every day | ORAL | 0 refills | Status: DC

## 2021-05-16 MED ORDER — BUTALBITAL-APAP-CAFFEINE 50-325-40 MG PO TABS: 1.0000 | ORAL_TABLET | Freq: Every day | ORAL | 0 refills | Status: DC | PRN

## 2021-05-16 MED ORDER — RIVAROXABAN 15 MG PO TABS
15.0000 mg | ORAL_TABLET | Freq: Every day | ORAL | 0 refills | Status: DC
Start: 2021-05-17 — End: 2021-06-14

## 2021-05-16 MED ORDER — PANCRELIPASE (LIP-PROT-AMYL) 12000-38000 UNITS PO CPEP: 12000.0000 [IU] | ORAL_CAPSULE | Freq: Three times a day (TID) | ORAL | 0 refills | Status: DC

## 2021-05-16 MED ORDER — PRAVASTATIN SODIUM 40 MG PO TABS
40.0000 mg | ORAL_TABLET | Freq: Every day | ORAL | 0 refills | Status: DC
Start: 2021-05-16 — End: 2021-09-15

## 2021-05-16 MED ORDER — PREGABALIN 50 MG PO CAPS
50.0000 mg | ORAL_CAPSULE | Freq: Two times a day (BID) | ORAL | 0 refills | Status: DC
Start: 2021-05-16 — End: 2021-06-30

## 2021-05-16 MED ORDER — BUDESONIDE 0.5 MG/2ML IN SUSP: 0.5000 mg | Freq: Two times a day (BID) | RESPIRATORY_TRACT | 0 refills | Status: DC

## 2021-05-16 MED ORDER — FAMOTIDINE 20 MG PO TABS: ORAL_TABLET | ORAL | 0 refills | Status: DC

## 2021-05-16 MED ORDER — UMECLIDINIUM-VILANTEROL 62.5-25 MCG/ACT IN AEPB: 1.0000 | INHALATION_SPRAY | Freq: Every day | RESPIRATORY_TRACT | 1 refills | Status: DC

## 2021-05-16 NOTE — Telephone Encounter (Signed)
PA for Anoro sent to [facility]

## 2021-05-16 NOTE — Telephone Encounter (Signed)
PA for Anoro submitted to [facility]

## 2021-05-16 NOTE — Progress Notes (Signed)
Inpatient Rehabilitation Discharge Medication Review by a Pharmacist  A complete drug regimen review was completed for this patient to identify any potential clinically significant medication issues.  High Risk Drug Classes Is patient taking? Indication by Medication  Antipsychotic No   Anticoagulant Yes Xarelto for Afib  Antibiotic No Abx completed  Opioid No   Antiplatelet Yes ASA - CAD  Hypoglycemics/insulin No   Vasoactive Medication No   Chemotherapy No   Other Yes Astelin, Flonase, Atrovent NS, Anoro, Singulair, Pulmicort nebs for COPD Aricept for dementia Protonix and Pepcid for Gerd Topamax, Lyrica for HA Pravastatin for HLD Synthroid for low thyroid     Type of Medication Issue Identified Description of Issue Recommendation(s)  Drug Interaction(s) (clinically significant)     Duplicate Therapy     Allergy     No Medication Administration End Date     Incorrect Dose     Additional Drug Therapy Needed     Significant med changes from prior encounter (inform family/care partners about these prior to discharge).    Other       Clinically significant medication issues were identified that warrant physician communication and completion of prescribed/recommended actions by midnight of the next day:  No  Pharmacist comments:   Time spent performing this drug regimen review (minutes):  20 minutes  Ulyses Southward, PharmD, [place], AAHIVP, CPP Infectious Disease Pharmacist 05/16/2021 9:26 AM

## 2021-05-16 NOTE — Progress Notes (Signed)
PROGRESS NOTE   Subjective/Complaints: Stable for d/c today Discussed Cr improved slightly Discussed following up with PCP in 5 days for repeat Creatinine check  ROS: +abdominal pain- improved, +right sided lower back pain, +dysuria, +hematuria, +shortness of breath, +cough  Objective:   DG Chest 2 View  Result Date: 05/14/2021 CLINICAL DATA:  Right-sided rib pain. EXAM: CHEST - 2 VIEW COMPARISON:  05/02/2021 FINDINGS: The lungs are clear without focal pneumonia, edema, pneumothorax or pleural effusion. Interstitial markings are diffusely coarsened with chronic features. The cardio pericardial silhouette is enlarged. Right PICC line tip overlies the mid SVC level. Bones are diffusely demineralized. IMPRESSION: No active cardiopulmonary disease. Similar appearance diffuse interstitial opacity, likely chronic. Electronically Signed   By: Misty Stanley M.D.   On: 05/14/2021 16:19   Recent Labs    05/14/21 2040  WBC 9.2  HGB 9.8*  HCT 32.2*  PLT 275    Recent Labs    05/15/21 0337 05/16/21 0333  NA 139 140  K 3.6 3.6  CL 114* 113*  CO2 20* 21*  GLUCOSE 120* 104*  BUN 26* 19  CREATININE 1.52* 1.48*  CALCIUM 8.0* 8.2*     Intake/Output Summary (Last 24 hours) at 05/16/2021 0908 Last data filed at 05/16/2021 0700 Gross per 24 hour  Intake 505 ml  Output --  Net 505 ml        Physical Exam: Vital Signs Blood pressure 114/63, pulse 68, temperature (!) 97.5 F (36.4 C), resp. rate 16, height $RemoveBe'5\' 2"'[id number]$  (1.575 m), weight 68.2 kg, SpO2 95 %. Gen: no distress, normal appearing HEENT: oral mucosa pink and moist, NCAT Cardio: Reg rate Chest: normal effort, normal rate of breathing Abd: soft, non-distended Ext: no edema Psych: pleasant, normal affect Skin: intact Neurological:     Mental Status: She is alert.     Cranial Nerves: No dysarthria or facial asymmetry.     Sensory: Sensation is intact.     Motor: Weakness  present.     Coordination: Coordination abnormal.     Gait: Gait abnormal.     Comments: Oriented to person time situation but not date   Motor strength is 5/5 in the right deltoid bicep tricep grip hip flexor knee extensor ankle dorsiflexor 4/5 in the left deltoid bicep tricep hip flexor knee extensor ankle dorsiflexor Left neglect is evident but can be cued to look to the left  Psychiatric:        Mood and Affect: Mood normal.  GU: Right sided flank tenderness to palpation   Assessment/Plan: 1. Functional deficits which require 3+ hours per day of interdisciplinary therapy in a comprehensive inpatient rehab setting. Physiatrist is providing close team supervision and 24 hour management of active medical problems listed below. Physiatrist and rehab team continue to assess barriers to discharge/monitor patient progress toward functional and medical goals  Care Tool:  Bathing    Body parts bathed by patient: Right arm, Left arm, Right lower leg, Left lower leg, Chest, Abdomen, Front perineal area, Buttocks, Right upper leg, Left upper leg, Face   Body parts bathed by helper: Buttocks     Bathing assist Assist Level: Set up assist  Upper Body Dressing/Undressing Upper body dressing   What is the patient wearing?: Pull over shirt (x2)    Upper body assist Assist Level: Set up assist    Lower Body Dressing/Undressing Lower body dressing      What is the patient wearing?: Underwear/pull up, Pants     Lower body assist Assist for lower body dressing: Set up assist     Toileting Toileting    Toileting assist Assist for toileting: Supervision/Verbal cueing     Transfers Chair/bed transfer  Transfers assist     Chair/bed transfer assist level: Supervision/Verbal cueing     Locomotion Ambulation   Ambulation assist      Assist level: Supervision/Verbal cueing Assistive device: Walker-rolling Max distance: 150   Walk 10 feet activity   Assist      Assist level: Supervision/Verbal cueing Assistive device: Walker-rolling   Walk 50 feet activity   Assist    Assist level: Supervision/Verbal cueing Assistive device: Walker-rolling    Walk 150 feet activity   Assist Walk 150 feet activity did not occur: Safety/medical concerns  Assist level: Supervision/Verbal cueing Assistive device: Walker-rolling    Walk 10 feet on uneven surface  activity   Assist     Assist level: Contact Guard/Touching assist Assistive device: Walker-rolling   Wheelchair     Assist Is the patient using a wheelchair?: Yes (transport to therapy room due to fatigue) Type of Wheelchair: Manual Wheelchair activity did not occur: Refused (fatigue)         Wheelchair 50 feet with 2 turns activity    Assist            Wheelchair 150 feet activity     Assist          Blood pressure 114/63, pulse 68, temperature (!) 97.5 F (36.4 C), resp. rate 16, height $RemoveBe'5\' 2"'[id number]$  (1.575 m), weight 68.2 kg, SpO2 95 %.    Medical Problem List and Plan: 1. 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   HFU scheduled with me 3/7  Continue multivitamin.  2.  Antithrombotics: -DVT/anticoagulation:  Pharmaceutical: Xarelto             -antiplatelet therapy: Aspirin 81 mg/day 3. Chronic headaches: Tylenol, continue pregabalin 50 mg twice daily, Topamax 200 mg twice daily. Add fioricet prn.  4. Mood: Monitor, consider neuropsych if depressed mood             -antipsychotic agents: None used at the current time   5. Neuropsych: This patient is capable of making decisions on her own behalf. 6. Skin/Wound Care: Monitor skin, turn every 2 hours 7. Fluids/Electrolytes/Nutrition: Routine I's and O's, check be met in a.m. 8.  UTI E. coli Pyelonephritis with stents for left hydronephrosis related to ureteral stone,  was septic and treated with IV antibiotics, restarted keflex as developed new pain with urination and UA was positive, Keflex started 1/18, UC with no growth, antibiotics and probiotics completed. Repeat culture ordered given white blood cell clumping and discussed with Dr. Winter 9.  Loose stool: d/c colace. D/c magnesium 10.  Cognitive deficit versus pre-existing continued Aricept 10 mg nightly 11.  COPD continue guaifenesin as needed as well as Proventil as needed and Breo elliptica 200/25 MCG  1 puff daily,  Singulair 10 mg daily, umeclidinium bromide 62.5 MCG 1 puff daily at 1530 12.  Pulmonary fibrosis post COVID, continue 1 to 2 L of oxygen, did not use oxygen at home prior to hospitalization #13.  Hyperlipidemia continue pravastatin 40 mg/day 14.  Dysphagia stroke continue D2 diet follow-up with speech therapy 15.  CODE STATUS is partial, no CPR, no defibrillation 16. Hypotension: improved, continue to monitor, medications reviewed and on noting that should significantly lower BP.  17. Right sided flank pain: given UTI and prior hydronephrosis, ordered CT abdomen/pelvis- discussed with patient that results are stable. Improved. Recommended calling urology on Monday to set up outpatient follow-up. Repeat creatinine reviewed and improved today.  18. Hematuria: consulted medicine, plan for outpatient follow-up with urology. Hgb repeated and stable, discussed with patient and husband. Advised husband to call urology today to set up follow-up.  19. Shortness of breath: CXR ordered and is stable. Continue IS   >30 minutes spent in discharge of patient including review of medications and follow-up appointments, physical examination, and in answering all patient's questions     LOS: 9 days A FACE TO FACE EVALUATION WAS Jenny Kelly 05/16/2021, 9:08 AM

## 2021-05-16 NOTE — Discharge Summary (Signed)
Physician Discharge Summary  Patient ID: CHAYANNE KREFT MRN: [id number] DOB/AGE: 1946/02/05 76 y.o.  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 EXAM: CHEST - 2 VIEW COMPARISON:  05/02/2021 FINDINGS: The lungs are clear without focal pneumonia, edema, pneumothorax or pleural effusion. Interstitial markings are diffusely coarsened with chronic features. The cardio pericardial silhouette is enlarged. Right PICC line tip overlies the mid SVC level. Bones are diffusely demineralized. IMPRESSION: No active cardiopulmonary disease. Similar appearance diffuse interstitial opacity, likely chronic. Electronically Signed   By: Misty Stanley M.D.   On: 05/14/2021 16:19   CT ABDOMEN PELVIS W CONTRAST  Result Date: 05/11/2021 CLINICAL DATA:  UTI.  Right-sided back pain. EXAM: CT ABDOMEN AND PELVIS WITH CONTRAST TECHNIQUE: Multidetector CT imaging of the abdomen and pelvis was performed using the standard protocol following bolus administration of intravenous contrast. RADIATION DOSE REDUCTION: This exam was performed according to the departmental dose-optimization program which includes automated exposure control, adjustment of the mA and/or kV according to patient size and/or use of iterative reconstruction technique. CONTRAST:  149mL OMNIPAQUE IOHEXOL 300 MG/ML  SOLN COMPARISON:  CT abdomen and pelvis 05/04/2021. CT abdomen and pelvis 03/22/2020. FINDINGS: Lower chest: Pleural effusions have resolved. There is some  chronic appearing reticular opacities in both lung bases. Hepatobiliary: No focal liver abnormality is seen. There are calcified granulomas in the spleen. Status post cholecystectomy. No biliary dilatation. Pancreas: Calcifications are again seen throughout the pancreas compatible with chronic pancreatitis. There is stable pancreatic ductal dilatation throughout body tail with diffuse pancreatic atrophy. Spleen: There are calcified granulomas in the spleen. The spleen is otherwise within normal limits. Adrenals/Urinary Tract: Left-sided ureteral stent is in place. There is no hydronephrosis. 7 mm calculus in the proximal left ureter is unchanged in position. Additional punctate nonobstructing left renal calculi are unchanged. The bladder, right kidney and adrenal glands are within normal limits. Stomach/Bowel: Stomach is within normal limits. Appendix appears normal. No evidence of bowel wall thickening, distention, or inflammatory changes. The appendix is not seen. Vascular/Lymphatic: Aortic atherosclerosis. No enlarged abdominal or pelvic lymph nodes. Reproductive: Status post hysterectomy. No adnexal masses. Other: No abdominal wall hernia or abnormality. No abdominopelvic ascites. Musculoskeletal: No acute or significant osseous findings. IMPRESSION: 1. Left ureteral stent is unchanged in position.  No hydronephrosis. 2. Stable proximal left ureteral and left renal calculi. 3. No other acute process identified. 4.  Aortic Atherosclerosis (ICD10-I70.0). 5. Findings compatible with chronic pancreatitis. There is pancreatic ductal dilatation in the body and tail similar to the prior study, but new from 2001. Please correlate clinically. Electronically Signed   By: Ronney Asters M.D.   On: 05/11/2021 15:53    Labs:  Basic Metabolic Panel: Recent Labs  Lab 05/15/21 0337 05/16/21 0333  NA 139 140  K 3.6 3.6  CL 114* 113*  CO2 20* 21*  GLUCOSE 120* 104*  BUN 26* 19  CREATININE 1.52* 1.48*  CALCIUM 8.0*  8.2*    CBC: Recent Labs  Lab 05/12/21 1545 05/14/21 2040  WBC 9.8  9.2  NEUTROABS 5.7 5.4  HGB 10.3* 9.8*  HCT 34.5* 32.2*  MCV 90.3 90.2  PLT 295 275    CBG: No results for input(s): GLUCAP in the last 168 hours.  Brief HPI:   Jenny Kelly is a 75 y.o. female with history of A. Fib-on Xarelto, dysuria times few days who was admitted via [facility] in 05/01/2021 with generalized weakness and left hemiplegia.  CTA head showed acute M1 M2 occlusion as well as left  7 mm upper ureteral stone with positive UA and she was transferred to [facility] for management.  She was treated with multiple fluid boluses for concerns of UTI with sepsis and required BiPAP due to respiratory distress with dyspnea.  CT perfusion showed 13 mm core infarct and she was not felt to be a candidate for any intervention. MRI brain showed moderate L-MCA infarct  and Dr. Erlinda Hong felt that stroke was due to high grade M1 and proximal M2 stenosis/occlusion in setting of septic shock and hypotension.   She did require dopamine briefly which was d/c due to few beasts V tach. She underwent cysto with emergent stent placement by Dr. Lovena Neighbours with recommendations for 14 day  course culture specific antibiotic for treatment. Hospital course significant for asymptomatic bradycardia and amiodarone was discontinued by cardiology. 2 D echo showed normal EF without significant valvular disease. Neurology recommended keeping SBP in 120-150 range due to intracranial stenosis. Patient noted to have functional decline and CIR was recommended for follow up therapy.    Hospital Course: JOSI HUMISTON was admitted to rehab 05/07/2021 for inpatient therapies to consist of PT and OT at least three hours five days a week. Past admission physiatrist, therapy team and rehab RN have worked together to provide customized collaborative inpatient rehab. Her blood pressures were monitored on TID basis and has controlled without  medications. She  reported worsening of chornic headaches and Fioricet was added prn in addition to magnesium. Headaches had resolved by admission. Her respiratory status has improved with use of Pulmicort nebs bid and Anoro. She was advised to continue current regimen and follow up with PCP for any further adjustments. Husband reported issues with diarrhea since covid therefore creon was added to see if this would help manage symptoms.   She was maintained on keflex through 05/12/21. Follow up labs showed H/H to be realatively stable and leucocytosis has resolved. She was making gains but on 01/18, she reported flank and abdominal pain. UA showed pyuria and she was treated with additional dose of Keflex X 3 days which was d/c as cultures were negative. CT abdomen/pelvis ordered due to ongoing discomfort and was negative for hydronephrosis and showed stent to be stable and unchanged.  Pancreatic ductal dilatation noted which was new since 2001 but patient without N/V or abdominal pain and was advised to follow up with GI for further input.   Repeat BMET did show worsening of renal status which was felt to be due to poor fluid intake. Dr. Lovena Neighbours was contacted for input and felt that symptoms/results consistent with stent and no further antibiotics needed unless patient febrile or had leucocytosis. UCS repeated and is pending at discharge. Her LOS was extended by a day and she was hydrated with IVF X 24 hours with improvement in BUN/SCr . She has been advised to increase fluid intake after discharge. She has made steady gains during her stay and supervision is recommended for safety. She will continue to receive follow up HHPT and  HHOT by [facility] after discharge.      Rehab course: During patient's stay in rehab weekly team conferences were held to monitor patient's progress, set goals and discuss barriers to discharge. At admission, patient required min assist with basic ADL tasks and with mobility. Her diet was  downgraded to dysphagia 2 due to prolonged mastication and multiple missing teeth with recommendations to upgrade as able. Husband reported that her cognition was at baseline and no follow up ST needed during her stay.  She  has had improvement in activity tolerance, balance, postural control as well as ability to compensate for deficits.  She is able to complete ADL tasks with supervision. She requires supervision for transfers and to ambulate 150' with  RW. Family education has been completed with husband.   Discharge disposition: 01-Home or Self Care  Diet: Dysphagia 2, limit carbs/sweets.  Special Instructions: Recommend repeating BMET in 7-10 days to monitor renal status/potassium levels. 2. Follow up with GI for input on findings of pancreatic ductal dilation.    Discharge Instructions     Ambulatory referral to Neurology   Complete by: As directed    An appointment is requested in approximately: 3-4 weeks      Allergies as of 05/16/2021       Reactions   Ciprofloxacin Hives   Ask patient   Prochlorperazine Edisylate Other (See Comments)   Cant swallow Muscle cramping   Prednisone    Reduced Iso-alpha Acids Complex    Statins Other (See Comments)   Muscle weakness   Sulfa Antibiotics Swelling   Azithromycin Rash   Latex Rash        Medication List     STOP taking these medications    cephALEXin 500 MG capsule Commonly known as: KEFLEX   MUCINEX PO   Trelegy Ellipta 100-62.5-25 MCG/ACT Aepb Generic drug: Fluticasone-Umeclidin-Vilant       TAKE these medications    albuterol 108 (90 Base) MCG/ACT inhaler Commonly known as: Proventil HFA Inhale two puffs every 4-6 hours if needed for coughing, wheezing, or shortness of breath.   aspirin 81 MG EC tablet Take 1 tablet (81 mg total) by mouth daily. Swallow whole. Notes to patient: Purchase over the counter   azelastine 0.1 % nasal spray Commonly known as: ASTELIN Use one spray in each nostril twice  daily   B-12 PO Take by mouth.   budesonide 0.5 MG/2ML nebulizer solution Commonly known as: PULMICORT Take 2 mLs (0.5 mg total) by nebulization in the morning and at bedtime.   butalbital-acetaminophen-caffeine 50-325-40 MG tablet--Rx 5 pills. Commonly known as: FIORICET Take 1 tablet by mouth daily as needed for migraine.   donepezil 10 MG tablet Commonly known as: ARICEPT Take 1 tablet (10 mg total) by mouth at bedtime.   ergocalciferol 1.25 MG (50000 UT) capsule Commonly known as: VITAMIN D2 Take 1 capsule (50,000 Units total) by mouth once a week. Take weekly on Wednesdays What changed:  when to take this additional instructions   famotidine 20 MG tablet Commonly known as: PEPCID Take one tablet once daily What changed: medication strength   fluticasone 50 MCG/ACT nasal spray Commonly known as: FLONASE SPRAY 1 SPRAY INTO BOTH NOSTRILS DAILY.   ipratropium 0.03 % nasal spray Commonly known as: ATROVENT SPRAY 2 SPRAYS INTO BOTH NOSTRILS EVERY 12 HOURS.   levothyroxine 88 MCG tablet Commonly known as: SYNTHROID Take 88 mcg by mouth daily before breakfast.   lipase/protease/amylase 12000-38000 units Cpep capsule Commonly known as: CREON  Take 1 capsule (12,000 Units total) by mouth 3 (three) times daily before meals.   montelukast 10 MG tablet Commonly known as: SINGULAIR Take 1 tablet (10 mg total) by mouth at bedtime.   multivitamin with minerals Tabs tablet Take 1 tablet by mouth daily. Start taking on: May 17, 2021   omeprazole 40 MG capsule Commonly known as: PRILOSEC Take 1 capsule (40 mg total) by mouth daily.   pravastatin 40 MG tablet Commonly known as: PRAVACHOL Take 1 tablet (40 mg total) by mouth daily at 6 PM.   pregabalin 50 MG capsule Commonly known as: LYRICA Take 1 capsule (50 mg total) by mouth 2 (two) times daily.   Rivaroxaban 15 MG Tabs tablet Commonly known as: XARELTO Take 1 tablet (15 mg total) by mouth daily. Start  taking on: May 17, 2021 What changed:  medication strength how much to take when to take this   topiramate 200 MG tablet Commonly known as: TOPAMAX Take 1 tablet (200 mg total) by mouth 2 (two) times daily.   umeclidinium-vilanterol 62.5-25 MCG/ACT Aepb Commonly known as: ANORO ELLIPTA Inhale 1 puff into the lungs daily. Start taking on: May 17, 2021   URISTAT PO Take 1 tablet by mouth as needed (UTI).        Follow-up Information     Raulkar, Clide Deutscher, MD Follow up.   Specialty: Physical Medicine and Rehabilitation Why: 06/27/21 please arrive at 2:40pm for 3pm follow-up, thank you! Contact information: [id number] [place] [address] [phone]         Ceasar Mons, MD Follow up on 06/01/2021.   Specialty: Urology Why: Be there at 8:30 for 8:45 am appt Contact information: [address] [place] [address] [phone]         [facility] Follow up.   Why: office will call you with follow up appointment Contact information: [address]     [place] [phone]        Townsend Roger, MD Follow up.   Specialty: Internal Medicine Contact information: [address] [address] [phone]         Misenheimer, Christia Reading, MD. Call.   Specialty: Unknown Physician Specialty Why: to follow up on dilated pancreatic duct. Contact information: [place] [address] [phone]                 Signed: Bary Leriche 05/16/2021, 7:11 PM

## 2021-05-16 NOTE — Progress Notes (Signed)
Pt discharged home with husband. All belongings given to husband. No complications noted.   Thanks, Orie Fisherman, LPN

## 2021-05-17 ENCOUNTER — Telehealth: Payer: Self-pay | Admitting: Physical Medicine and Rehabilitation

## 2021-05-17 LAB — URINE CULTURE

## 2021-05-17 NOTE — H&P (Signed)
Physical Medicine and Rehabilitation Admission H&P         Chief Complaint  Patient presents with   Shortness of Breath      Code Stroke  : HPI: 76 year old female with history of atrial fibrillation on Xarelto who presented to [facility] on 05/01/2021 with acute left hemiplegia.  CT angiogram showed acute M1 M2 occlusion on the right side.  UA showed evidence of urinary tract infection.  She had hypotension requiring fluid boluses felt to be related to urosepsis.  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  Patient initially required pressor support in the ICU setting.  Had a 25 beat run of V. tach and dopamine drip was discontinued.  Left ureteral stone was discovered and underwent cystoscopy with left ureteral stent placement on 1/9 no neuro interventional procedures were recommended.  Other past medical history includes diabetes CKD 3 coronary artery disease status post CABG The patient was evaluated by neurology, stroke team, due to intracranial stenosis recommendations were to keep blood pressure goal from 1 86-7 50 systolic. The patient was evaluated by Dr. Alfonse Spruce from cardiology.  Echocardiogram showed normal EF with no significant valvular disease or source of embolism, grade 2 diastolic dysfunction was present. The patient was seen by electrophysiology, Dr. Caryl Comes for evaluation of symptomatic bradycardia.  Amiodarone was discontinued Evaluated by speech therapy placed on a D2 diet. Review of Systems  Constitutional:  Negative for chills and fever.  HENT:  Negative for congestion, sinus pain and sore throat.   Eyes:  Negative for pain, discharge and redness.  Respiratory:  Positive for cough and wheezing. Negative for shortness of breath.   Cardiovascular:  Negative for chest pain and leg swelling.  Gastrointestinal:  Negative for diarrhea, nausea and vomiting.  Genitourinary:  Negative for dysuria and flank pain.   Musculoskeletal:  Positive for joint pain. Negative for back pain and neck pain.  Skin:  Negative for itching and rash.  Neurological:  Positive for focal weakness and headaches. Negative for speech change.  Endo/Heme/Allergies:  Does not bruise/bleed easily.  Psychiatric/Behavioral:  Positive for memory loss. Negative for depression and hallucinations.       Past Medical History:  Diagnosis Date   Allergy     Anemia     Anxiety     Arthritis     Asthma     Atrial fibrillation ([place])     Blood transfusion without reported diagnosis     Cataract     CHF (congestive heart failure) ([place])     Chronic kidney disease     Clotting disorder (HCC)     COPD (chronic obstructive pulmonary disease) ([place])     Depression     Diabetes mellitus without complication (HCC)     GERD (gastroesophageal reflux disease)     Heart murmur     Hyperlipidemia     Myocardial infarction ([place])     Neuromuscular disorder ([place])      tremors   Seizures ([place])     Sleep apnea     Stroke ([place])     Thyroid disease           Past Surgical History:  Procedure Laterality Date   ABDOMINAL HYSTERECTOMY       APPENDECTOMY       bladder tack       CHOLECYSTECTOMY       CORONARY ARTERY BYPASS GRAFT  CYSTOSCOPY W/ URETERAL STENT PLACEMENT Left 05/01/2021    Procedure: CYSTOSCOPY WITH RETROGRADE PYELOGRAM/URETERAL STENT PLACEMENT;  Surgeon: Ceasar Mons, MD;  Location: [place];  Service: Urology;  Laterality: Left;   ESOPHAGOGASTRODUODENOSCOPY ENDOSCOPY       EYE SURGERY       FRACTURE SURGERY Right      3rd finger   JOINT REPLACEMENT Bilateral      knees   TONSILLECTOMY AND ADENOIDECTOMY       TUBAL LIGATION        Family History  Family history unknown: Yes    Social History:  reports that she quit smoking about 20 years ago. Her smoking use included cigarettes. She has a 15.00 pack-year smoking history. She has never used smokeless tobacco. She reports that she does not currently use  alcohol. She reports that she does not use drugs. Allergies:       Allergies  Allergen Reactions   Ciprofloxacin Hives      Ask patient   Prochlorperazine Edisylate Other (See Comments)      Cant swallow Muscle cramping   Prednisone     Reduced Iso-Alpha Acids Complex     Statins Other (See Comments)      Muscle weakness     Sulfa Antibiotics Swelling   Azithromycin Rash   Latex Rash          Medications Prior to Admission  Medication Sig Dispense Refill   amiodarone (PACERONE) 100 MG tablet Take 50 mg by mouth at bedtime.       Cyanocobalamin (B-12 PO) Take by mouth.       donepezil (ARICEPT) 10 MG tablet Take 10 mg by mouth at bedtime.       ergocalciferol (VITAMIN D2) 1.25 MG (50000 UT) capsule Take 50,000 Units by mouth See admin instructions. Takes twice a week on Wednesday and Saturday       guaiFENesin (MUCINEX PO) Take 1 tablet by mouth as needed (cough, congestion).       levothyroxine (SYNTHROID) 88 MCG tablet Take 88 mcg by mouth daily before breakfast.       montelukast (SINGULAIR) 10 MG tablet Take 10 mg by mouth at bedtime.       omeprazole (PRILOSEC) 40 MG capsule Take 40 mg by mouth daily.       Phenazopyridine HCl (URISTAT PO) Take 1 tablet by mouth as needed (UTI).       pregabalin (LYRICA) 50 MG capsule Take 50 mg by mouth 2 (two) times daily.       rivaroxaban (XARELTO) 20 MG TABS tablet Take 20 mg by mouth every evening.       topiramate (TOPAMAX) 200 MG tablet Take 200 mg by mouth 2 (two) times daily.       albuterol (PROVENTIL HFA) 108 (90 Base) MCG/ACT inhaler Inhale two puffs every 4-6 hours if needed for coughing, wheezing, or shortness of breath. (Patient not taking: Reported on 05/01/2021) 1 each 1   azelastine (ASTELIN) 0.1 % nasal spray Use one spray in each nostril twice daily (Patient not taking: Reported on 05/01/2021) 30 mL 5   budesonide (PULMICORT) 0.5 MG/2ML nebulizer solution Take 2 mLs (0.5 mg total) by nebulization in the morning and at bedtime.  (Patient not taking: Reported on 05/01/2021) 120 mL 5   famotidine (PEPCID) 40 MG tablet Take one tablet once daily (Patient not taking: Reported on 05/01/2021) 30 tablet 5   fluticasone (FLONASE) 50 MCG/ACT nasal spray SPRAY 1 SPRAY INTO BOTH NOSTRILS  DAILY. (Patient not taking: Reported on 05/01/2021) 48 mL 2   Fluticasone-Umeclidin-Vilant (TRELEGY ELLIPTA) 100-62.5-25 MCG/INH AEPB Inhale 1 puff into the lungs daily. (Patient not taking: Reported on 05/01/2021) 28 each 6   ipratropium (ATROVENT) 0.03 % nasal spray SPRAY 2 SPRAYS INTO BOTH NOSTRILS EVERY 12 HOURS. (Patient not taking: Reported on 05/01/2021) 90 mL 3      Drug Regimen Review  Drug regimen was reviewed and remains appropriate with no significant issues identified   Home: Home Living Family/patient expects to be discharged to:: Inpatient rehab Living Arrangements: Spouse/significant other Available Help at Discharge: Family, Available 24 hours/day Type of Home: House Home Access: Level entry Home Layout: One level Bathroom Shower/Tub: Tub/shower unit, Door [employer] Toilet: Standard Bathroom Accessibility: Yes Home Equipment: Conservation officer, nature (2 wheels), BSC/3in1, Civil engineer, contracting  Lives With: Spouse   Functional History: Prior Function Prior Level of Function : Needs assist  Cognitive Assist : ADLs (cognitive) ADLs (Cognitive):  (IADL medicine and financial management by husband) Physical Assist : ADLs (physical) ADLs (physical): Dressing, Bathing, IADLs Mobility Comments: ambulating independently with no AD ADLs Comments: assisting with bathing and dressing   Functional Status:  Mobility: Bed Mobility Overal bed mobility: Needs Assistance Bed Mobility: Supine to Sit, Rolling Rolling: Supervision Supine to sit: Min assist, HOB elevated (used bed rails) General bed mobility comments: min A for trunk elevation. Transfers Overall transfer level: Needs assistance Equipment used: 1 person hand held assist Transfers: Sit to/from  Stand, Bed to chair/wheelchair/BSC Sit to Stand: Mod assist Bed to/from chair/wheelchair/BSC transfer type:: Stand pivot Stand pivot transfers: Min assist Step pivot transfers: Mod assist, +2 safety/equipment General transfer comment: Light mod A to stand and min A to pivot to recliner chair. Face to face with HHA for transfers   ADL: ADL Overall ADL's : Needs assistance/impaired Eating/Feeding: Set up, Sitting Eating/Feeding Details (indicate cue type and reason): Pt found in bed with husband feeding her.  After pt moved to chair, pt able to finish feeding herself. Grooming: Wash/dry hands, Wash/dry face, Brushing hair, Set up, Sitting Grooming Details (indicate cue type and reason): sitting EOB for appx 4 min to do simple grooming Upper Body Bathing: Moderate assistance, Sitting Upper Body Bathing Details (indicate cue type and reason): husband assists her at baseline Lower Body Bathing: Moderate assistance, Sitting/lateral leans Lower Body Bathing Details (indicate cue type and reason): Pt is able to perform figure 4 with BLE Upper Body Dressing : Minimal assistance, Sitting Lower Body Dressing: Minimal assistance, Sitting/lateral leans Lower Body Dressing Details (indicate cue type and reason): will likely need more assist for cloting that requires sit<>stand Toilet Transfer: Moderate assistance, Buyer, retail Details (indicate cue type and reason): Pt required assist x1 today and transfers to strong side better than weak side. Pt require cues to make sure chair was behind her and cues to reach back for the chair. Toileting- Clothing Manipulation and Hygiene: Moderate assistance, Sit to/from stand Toileting - Clothing Manipulation Details (indicate cue type and reason): assist with cleaning self due to poor standing balance. Pt held to walker while second person cleaned peri area due to inability to let go of walker to clean self. Tub/ Shower Transfer: Moderate assistance,  Ambulation, Shower seat Functional mobility during ADLs: Moderate assistance, Standard walker General ADL Comments: L sided weakness, balance, cognition, HOH impacting ADL performance   Cognition: Cognition Overall Cognitive Status: Impaired/Different from baseline Orientation Level: Oriented X4 Cognition Arousal/Alertness: Awake/alert Behavior During Therapy: WFL for tasks assessed/performed Overall Cognitive Status: Impaired/Different  from baseline Area of Impairment: Memory, Safety/judgement, Awareness, Problem solving Memory: Decreased short-term memory Safety/Judgement: Decreased awareness of safety, Decreased awareness of deficits Awareness: Emergent Problem Solving: Requires verbal cues, Decreased initiation General Comments: Husband is very helpful and likes to spoil the pt by doing for her. Had a nice converation with him about letting her do some for herself and then assisting when pt gets fatigued.  Cues required for sequencing for transfers.   Physical Exam: Blood pressure (!) 116/54, pulse 70, temperature 98.5 F (36.9 C), temperature source Oral, resp. rate 19, height _0  (1.549 m), weight 67 kg, SpO2 93 %. Physical Exam Vitals and nursing note reviewed.  Constitutional:      Appearance: She is obese.  HENT:     Head: Normocephalic and atraumatic.     Comments: Scab on left side of nose from oxygen mask used during transport Eyes:     Extraocular Movements: Extraocular movements intact.     Pupils: Pupils are equal, round, and reactive to light.  Cardiovascular:     Rate and Rhythm: Normal rate and regular rhythm.     Heart sounds: No murmur heard. Pulmonary:     Effort: Pulmonary effort is normal.     Breath sounds: Examination of the right-upper field reveals wheezing. Examination of the left-upper field reveals wheezing. Wheezing present. No rhonchi.  Chest:     Comments: Healed midline sternotomy Skin:    General: Skin is warm and dry.  Neurological:      Mental Status: She is alert.     Cranial Nerves: No dysarthria or facial asymmetry.     Sensory: Sensation is intact.     Motor: Weakness present.     Coordination: Coordination abnormal.     Gait: Gait abnormal.     Comments: Oriented to person time situation but not date   Motor strength is 5/5 in the right deltoid bicep tricep grip hip flexor knee extensor ankle dorsiflexor 4/5 in the left deltoid bicep tricep hip flexor knee extensor ankle dorsiflexor Left neglect is evident but can be cued to look to the left  Psychiatric:        Mood and Affect: Mood normal.      Lab Results Last 48 Hours        Results for orders placed or performed during the hospital encounter of 05/01/21 (from the past 48 hour(s))  CBC with Differential/Platelet     Status: Abnormal    Collection Time: 05/06/21  3:00 AM  Result Value Ref Range    WBC 10.2 4.0 - 10.5 K/uL    RBC 3.32 (L) 3.87 - 5.11 MIL/uL    Hemoglobin 8.9 (L) 12.0 - 15.0 g/dL    HCT 29.9 (L) 36.0 - 46.0 %    MCV 90.1 80.0 - 100.0 fL    MCH 26.8 26.0 - 34.0 pg    MCHC 29.8 (L) 30.0 - 36.0 g/dL    RDW 20.2 (H) 11.5 - 15.5 %    Platelets 144 (L) 150 - 400 K/uL    nRBC 0.0 0.0 - 0.2 %    Neutrophils Relative % 69 %    Neutro Abs 7.0 1.7 - 7.7 K/uL    Lymphocytes Relative 22 %    Lymphs Abs 2.2 0.7 - 4.0 K/uL    Monocytes Relative 4 %    Monocytes Absolute 0.4 0.1 - 1.0 K/uL    Eosinophils Relative 5 %    Eosinophils Absolute 0.5 0.0 - 0.5 K/uL  Basophils Relative 0 %    Basophils Absolute 0.0 0.0 - 0.1 K/uL    WBC Morphology See Note        Comment: Mild Left Shift. 1 to 5% Metas and Myelos, Occ Pro Noted.    nRBC 0 0 /100 WBC    Abs Immature Granulocytes 0.00 0.00 - 0.07 K/uL      Comment: Performed at [facility], [place] [address], [place], [place] [address]  Basic metabolic panel     Status: Abnormal    Collection Time: 05/06/21  3:00 AM  Result Value Ref Range    Sodium 139 135 - 145 mmol/L    Potassium 3.8 3.5 -  5.1 mmol/L    Chloride 113 (H) 98 - 111 mmol/L    CO2 22 22 - 32 mmol/L    Glucose, Bld 96 70 - 99 mg/dL      Comment: Glucose reference range applies only to samples taken after fasting for at least 8 hours.    BUN 8 8 - 23 mg/dL    Creatinine, Ser 1.11 (H) 0.44 - 1.00 mg/dL    Calcium 7.9 (L) 8.9 - 10.3 mg/dL    GFR, Estimated 52 (L) >60 mL/min      Comment: (NOTE) Calculated using the CKD-EPI Creatinine Equation (2021)      Anion gap 4 (L) 5 - 15      Comment: Performed at [facility] [address], [place], [place] [address]      Imaging Results (Last 48 hours)  No results found.           Medical Problem List and Plan: 1. Functional deficits secondary to right MCA infarct             -patient may  shower             -ELOS/Goals: 10 to 12 days supervision to modified independent goals 2.  Antithrombotics: -DVT/anticoagulation:  Pharmaceutical: Xarelto             -antiplatelet therapy: Aspirin 81 mg/day 3. Pain Management: Tylenol, pregabalin 50 mg twice daily for chronic headaches Topamax 200 mg twice daily for chronic headaches 4. Mood: Monitor, consider neuropsych if depressed mood             -antipsychotic agents: None used at the current time   5. Neuropsych: This patient is capable of making decisions on her own behalf. 6. Skin/Wound Care: Monitor skin, turn every 2 hours 7. Fluids/Electrolytes/Nutrition: Routine I's and O's, check be met in a.m. 8.  UTI E. coli Pyelonephritis with stents for left hydronephrosis related to ureteral stone, was septic and treated with IV antibiotics, continue cephalexin 500 mg 3 times daily until 05/12/2021   9.  Constipation Colace 100 mg p.o. twice daily as needed 10.  Cognitive deficit versus pre-existing continued Aricept 10 mg nightly 11.  COPD continue guaifenesin as needed as well as Proventil as needed and Breo elliptica 200/25 MCG  1 puff daily, Singulair 10 mg daily, umeclidinium bromide 62.5 MCG 1 puff daily at  1530 12.  Pulmonary fibrosis post COVID, continue 1 to 2 L of oxygen, did not use oxygen at home prior to hospitalization #13.  Hyperlipidemia continue pravastatin 40 mg/day 14.  Dysphagia stroke continue D2 diet follow-up with speech therapy 15.  CODE STATUS is partial, no CPR, no defibrillation       Charlett Blake, MD 05/07/2021

## 2021-05-17 NOTE — Telephone Encounter (Signed)
[employer] denied Anoro. They stated the preferred medications are Stiolto Respimat, Bevespi Aerosphere HFA, and Striverdi Respimat. She must try and fail all of these.

## 2021-05-17 NOTE — Telephone Encounter (Signed)
Contacted husband re: negative UCS results.

## 2021-05-19 ENCOUNTER — Telehealth: Payer: Self-pay

## 2021-05-19 NOTE — Telephone Encounter (Signed)
Jenny Kelly, OT called and requested verbal orders for OT once a week for 8 weeks. Verbal orders given

## 2021-05-19 NOTE — Telephone Encounter (Signed)
Denial for Anoro faxed to PCP Dr. Michel Santee [facility]

## 2021-05-22 DIAGNOSIS — I4891 Unspecified atrial fibrillation: Secondary | ICD-10-CM | POA: Diagnosis not present

## 2021-05-22 DIAGNOSIS — I679 Cerebrovascular disease, unspecified: Secondary | ICD-10-CM | POA: Diagnosis not present

## 2021-05-26 ENCOUNTER — Telehealth: Payer: Self-pay

## 2021-05-26 NOTE — Telephone Encounter (Signed)
Elna Breslow  Physical Therapist , from [facility] called to notify us the patient is at a level 8 in pain . Reports that  husband gave her some pain medication .

## 2021-06-01 DIAGNOSIS — Z466 Encounter for fitting and adjustment of urinary device: Secondary | ICD-10-CM | POA: Diagnosis not present

## 2021-06-01 DIAGNOSIS — K861 Other chronic pancreatitis: Secondary | ICD-10-CM | POA: Diagnosis not present

## 2021-06-01 DIAGNOSIS — N201 Calculus of ureter: Secondary | ICD-10-CM | POA: Diagnosis not present

## 2021-06-01 DIAGNOSIS — N200 Calculus of kidney: Secondary | ICD-10-CM | POA: Diagnosis not present

## 2021-06-07 ENCOUNTER — Ambulatory Visit: Payer: Medicare HMO | Admitting: Adult Health

## 2021-06-07 ENCOUNTER — Encounter: Payer: Self-pay | Admitting: Adult Health

## 2021-06-07 ENCOUNTER — Telehealth: Payer: Self-pay | Admitting: *Deleted

## 2021-06-07 VITALS — BP 147/71 | HR 83 | Ht 62.0 in | Wt 144.0 lb

## 2021-06-07 DIAGNOSIS — Z09 Encounter for follow-up examination after completed treatment for conditions other than malignant neoplasm: Secondary | ICD-10-CM

## 2021-06-07 DIAGNOSIS — I63311 Cerebral infarction due to thrombosis of right middle cerebral artery: Secondary | ICD-10-CM | POA: Diagnosis not present

## 2021-06-07 DIAGNOSIS — I480 Paroxysmal atrial fibrillation: Secondary | ICD-10-CM

## 2021-06-07 NOTE — Progress Notes (Signed)
I agree with the above plan 

## 2021-06-07 NOTE — Patient Instructions (Signed)
Continue working with home health therapies - if you would like to particpate with speech therapy (for swallowing and memory) please let me know. You wil likely need to transition to outpatient therapies once completed  Please re-establish care with cardiology - they will further assist with Xarelto and holding recommendations for urology procedure   Continue aspirin 81 mg daily and Xarelto (rivaroxaban) daily  and pravastatin  for secondary stroke prevention, history of A fib and coronary artery disease   Continue to follow up with PCP regarding cholesterol and blood pressure management  Maintain strict control of hypertension with blood pressure goal below 130/90 and cholesterol with LDL cholesterol (bad cholesterol) goal below 70 mg/dL.   Signs of a Stroke? Follow the BEFAST method:  Balance Watch for a sudden loss of balance, trouble with coordination or vertigo Eyes Is there a sudden loss of vision in one or both eyes? Or double vision?  Face: Ask the person to smile. Does one side of the face droop or is it numb?  Arms: Ask the person to raise both arms. Does one arm drift downward? Is there weakness or numbness of a leg? Speech: Ask the person to repeat a simple phrase. Does the speech sound slurred/strange? Is the person confused ? Time: If you observe any of these signs, call 911.    Followup in the future with me in 4 months or call earlier if needed       Thank you for coming to see [place] at [facility]. I hope we have been able to provide you high quality care today.  You may receive a patient satisfaction survey over the next few weeks. We would appreciate your feedback and comments so that we may continue to improve ourselves and the health of our patients.

## 2021-06-07 NOTE — Progress Notes (Signed)
[facility] [address] [place]. [place] [address] [phone]       [place] Jenny Kelly Date of Birth:  03-27-1946 Medical Record Number:  [id number]   Reason for Referral:  hospital stroke follow up    SUBJECTIVE:   CHIEF COMPLAINT:  Chief Complaint  Patient presents with   Follow-up    RM 3 with spouse james  Pt is well, spouse states she has weakness on L side and some confusion. Pt cant describe the "weird feeling" she is having in her head.     HPI:   Mrs. Jenny Kelly is a 76 y.o. female with pertinent history of a.fib (xarelto), Anemia, CHF, Chronic kidney disease, COPD, Diabetes mellitus without complication, Hyperlipidemia, Myocardial infarction, Neuromuscular disorder, Seizures, Sleep apnea, hx of Stroke and Thyroid disease who presented to [facility] on 05/01/2021 with nonsensical speech, left facial weakness, lethargy and weakness. Eval by telenurology - not a candidate for TNK due to anticoagulation. CTA head/neck showed acute right M1 occlusion and severe stenosis of right M2 distally and transferred to [facility] for CTP and possible thrombectomy.  Personally reviewed hospitalization pertinent progress notes, lab work and imaging.  Neuro and IR decided against mechanical thrombectomy given small penumbra.  Evaluated by Dr. Erlinda Hong for right MCA infarcts (involving the insula, anterior temporal lobe and inferior frontal lobe) likely due to right M1 proximal M2 high-grade stenosis/occlusion in setting of sepsis, septic shock and hypotension.  EF 60 to 65%.  LDL 98.  A1c 5.9.  On Xarelto PTA for A-fib continuation in addition to aspirin 81 mg daily. Noted results of bradycardia and hypotension eventually stabilized with BP goal 120-1 50 given stenosis.  History of statin intolerance therefore recommended low intensity pravastatin 40 mg daily.  Multiple other stroke risk factors include advanced age, former tobacco use, hemorrhagic  stroke 2009, CAD, MR s/p stent c/b pulmonary emboli 2002 and triple bypass cardiac surgery 2015, migraine headaches, OSA on CPAP and CHF.  Hospital course significant for for ureteral stone with UTI s/p cystoscopy with left ureteral stent placement and left retrograde pyelogram, respiratory distress requiring BiPAP and nebulizers, hypotension and bradycardia and pancreatic ductal dilation.  Discharged to [facility] on 1/15 per therapy recommendations.    Today, 06/07/2021, patient being seen for initial hospital follow-up accompanied by her husband, [name].  She was discharged home from [facility] on 1/24 with recommendations of HH PT/OT. Husband assists with history. Reports continued left-sided weakness, left arm pain, dysphagia and slight decline of cognition from baseline.  Currently working with [facility] PT/OT - does not improvement of weakness and gait. Use of rollator walker for longer distance but does not need for short distance.  No recent falls.  Does complain of left shoulder pain.  Maintains a softer diet as difficulty swallowing larger chewier foods.  Occasional difficulty with thin liquids.  Not currently working with SLP (declined SLP at [place] d/c). Husband does assist with ADLs but has been gradually doing more things on her own (suspect husband assisting more than needed). Does have confusion at times - remains on Aricept which she was on PTA. Denies new stroke/TIA symptoms. Per husband, on topiramate for history of seizures and pregabalin for headaches managed by PCP. Reports compliance on Xarelto, aspirin and pravastatin without side effects.  Blood pressure today 147/71.  She has since had follow-up with PCP and scheduled for PMR follow-up 3/7. She has since seen neurologist at [facility] urology - per husband, they plan  on replacing current ureteral stent, wishing to do this ASAP, and questions holding Xarelto and aspirin.  Previously followed by cardiology clinic in [place] but has not had any recent follow-up.   No further concerns at this time.     PERTINENT IMAGING  Per hospitalization 05/01/2021 CTA head & neck acute right M1 occlusion and severe stenosis of the right M2 more distally CT perfusion 13 mL infarct core, involving the right MCA territory in the right posterior temporal, posterior frontal, and anterior parietal lobes. MRI  Acute infarcts involving the insula, anterior temporal lobe, and inferior frontal lobe, consistent with known recent right MCA occlusion. Additional areas of restricted diffusion in the caudate and lentiform nucleus appear slightly less hyperintense on diffusion-weighted imaging, possibly slightly less acute infarcts.  2D Echo EF 60-65% LDL 98 HgbA1c 5.9    ROS:   14 system review of systems performed and negative with exception of those listed in HPI  PMH:  Past Medical History:  Diagnosis Date   Allergy    Anemia    Anxiety    Arthritis    Asthma    Atrial fibrillation ([place])    Blood transfusion without reported diagnosis    Cataract    CHF (congestive heart failure) (HCC)    Chronic kidney disease    Clotting disorder (HCC)    COPD (chronic obstructive pulmonary disease) ([place])    Depression    Diabetes mellitus without complication (HCC)    GERD (gastroesophageal reflux disease)    Heart murmur    Hyperlipidemia    Myocardial infarction ([place])    Neuromuscular disorder (HCC)    tremors   Seizures (HCC)    Sleep apnea    Stroke ([place])    Thyroid disease     PSH:  Past Surgical History:  Procedure Laterality Date   ABDOMINAL HYSTERECTOMY     APPENDECTOMY     bladder tack     CHOLECYSTECTOMY     CORONARY ARTERY BYPASS GRAFT     CYSTOSCOPY W/ URETERAL STENT PLACEMENT Left 05/01/2021   Procedure: CYSTOSCOPY WITH RETROGRADE PYELOGRAM/URETERAL STENT PLACEMENT;  Surgeon: Ceasar Mons, MD;  Location: [place];  Service: Urology;  Laterality: Left;   ESOPHAGOGASTRODUODENOSCOPY ENDOSCOPY     EYE SURGERY     FRACTURE SURGERY Right     3rd finger   JOINT REPLACEMENT Bilateral    knees   TONSILLECTOMY AND ADENOIDECTOMY     TUBAL LIGATION      Social History:  Social History   Socioeconomic History   Marital status: Married    Spouse name: Ulice Dash   Number of children: 3   Years of education: Not on file   Highest education level: Not on file  Occupational History   Not on file  Tobacco Use   Smoking status: Former    Packs/day: 0.50    Years: 30.00    Pack years: 15.00    Types: Cigarettes    Quit date: 06/26/2000    Years since quitting: 20.9   Smokeless tobacco: Never  Vaping Use   Vaping Use: Former  Substance and Sexual Activity   Alcohol use: Not Currently   Drug use: Never   Sexual activity: Not on file  Other Topics Concern   Not on file  Social History Narrative   Not on file   Social Determinants of Health   Financial Resource Strain: Not on file  Food Insecurity: Not on file  Transportation Needs: Not on file  Physical Activity:  Not on file  Stress: Not on file  Social Connections: Not on file  Intimate Partner Violence: Not on file    Family History:  Family History  Family history unknown: Yes    Medications:   Current Outpatient Medications on File Prior to Visit  Medication Sig Dispense Refill   albuterol (PROVENTIL HFA) 108 (90 Base) MCG/ACT inhaler Inhale two puffs every 4-6 hours if needed for coughing, wheezing, or shortness of breath. 1 each 1   aspirin 81 MG EC tablet Take 1 tablet (81 mg total) by mouth daily. Swallow whole. 30 tablet 0   azelastine (ASTELIN) 0.1 % nasal spray Use one spray in each nostril twice daily 30 mL 5   budesonide (PULMICORT) 0.5 MG/2ML nebulizer solution Take 2 mLs (0.5 mg total) by nebulization in the morning and at bedtime. 120 mL 0   butalbital-acetaminophen-caffeine (FIORICET) 50-325-40 MG tablet Take 1 tablet by mouth daily as needed for migraine. 5 tablet 0   Cyanocobalamin (B-12 PO) Take by mouth.     donepezil (ARICEPT) 10 MG tablet Take  1 tablet (10 mg total) by mouth at bedtime. 30 tablet 0   famotidine (PEPCID) 20 MG tablet Take one tablet once daily 30 tablet 0   fluticasone (FLONASE) 50 MCG/ACT nasal spray SPRAY 1 SPRAY INTO BOTH NOSTRILS DAILY. 48 mL 2   ipratropium (ATROVENT) 0.03 % nasal spray SPRAY 2 SPRAYS INTO BOTH NOSTRILS EVERY 12 HOURS. 90 mL 3   levothyroxine (SYNTHROID) 88 MCG tablet Take 88 mcg by mouth daily before breakfast.     lipase/protease/amylase (CREON) 12000-38000 units CPEP capsule Take 1 capsule (12,000 Units total) by mouth 3 (three) times daily before meals. 270 capsule 0   montelukast (SINGULAIR) 10 MG tablet Take 1 tablet (10 mg total) by mouth at bedtime. 30 tablet 0   Multiple Vitamin (MULTIVITAMIN WITH MINERALS) TABS tablet Take 1 tablet by mouth daily.     omeprazole (PRILOSEC) 40 MG capsule Take 1 capsule (40 mg total) by mouth daily. 30 capsule 0   pravastatin (PRAVACHOL) 40 MG tablet Take 1 tablet (40 mg total) by mouth daily at 6 PM. 30 tablet 0   pregabalin (LYRICA) 50 MG capsule Take 1 capsule (50 mg total) by mouth 2 (two) times daily. 60 capsule 0   Rivaroxaban (XARELTO) 15 MG TABS tablet Take 1 tablet (15 mg total) by mouth daily. 30 tablet 0   topiramate (TOPAMAX) 200 MG tablet Take 1 tablet (200 mg total) by mouth 2 (two) times daily. 60 tablet 0   umeclidinium-vilanterol (ANORO ELLIPTA) 62.5-25 MCG/ACT AEPB Inhale 1 puff into the lungs daily. 14 each 1   ergocalciferol (VITAMIN D2) 1.25 MG (50000 UT) capsule Take 1 capsule (50,000 Units total) by mouth once a week. Take weekly on Wednesdays (Patient not taking: Reported on 06/07/2021) 5 capsule 0   Phenazopyridine HCl (URISTAT PO) Take 1 tablet by mouth as needed (UTI). (Patient not taking: Reported on 06/07/2021)     No current facility-administered medications on file prior to visit.    Allergies:   Allergies  Allergen Reactions   Ciprofloxacin Hives    Ask patient   Prochlorperazine Edisylate Other (See Comments)    Cant  swallow Muscle cramping   Prednisone    Reduced Iso-Alpha Acids Complex    Statins Other (See Comments)    Muscle weakness    Sulfa Antibiotics Swelling   Azithromycin Rash   Latex Rash      OBJECTIVE:  Physical Exam  Vitals:  06/07/21 0910  BP: (!) 147/71  Pulse: 83  Weight: 144 lb (65.3 kg)  Height: 5\' 2"  (1.575 m)   Body mass index is 26.34 kg/m. No results found.  General: well developed, well nourished, very pleasant elderly Caucasian female, seated, in no evident distress Head: head normocephalic and atraumatic.   Neck: supple with no carotid or supraclavicular bruits Cardiovascular: regular rate and rhythm, no murmurs Musculoskeletal: no deformity Skin:  no rash/petichiae Vascular:  Normal pulses all extremities   Neurologic Exam Mental Status: Awake and fully alert.  Mild dysarthria although poor denture noted. Delayed responses but able to follow commands and answer questions appropriately.  Unable to appreciate aphasia.  Disoriented to place and year (able to state month). Recent and remote memory impaired. Attention span, concentration and fund of knowledge impaired with husband providing majority of history. Mood and affect appropriate.  Cranial Nerves: Fundoscopic exam reveals sharp disc margins. Pupils equal, briskly reactive to light. Extraocular movements full without nystagmus. Visual fields full to confrontation. Hearing intact. Facial sensation intact.  Left nasolabial fold flattening.  Tongue, palate moves normally and symmetrically.  Motor:  LUE: 3/5 deltoid (although limited eval due to shoulder pain), 4/5 elbow extension, 3/5 elbow flexion, 3+/5 grip strength. Decreased shoulder ROM d/t pain LLE: 4-/5 HF, 4/5 KE, 4/5 KF, 4+5-5/5 ADF and APF RUE: 4+/5 RLE: 4+/5 Amt of weakness may be exaggerated based on exam and noted poor effort  Sensory.:  Reports decreased light touch sensation LUE otherwise intact Coordination: Rapid alternating movements  normal in all extremities except decreased left hand. Finger-to-nose and heel-to-shin performed on right side. Gait and Station: Arises from chair without difficulty. Stance is slightly hunched. Gait demonstrates normal stride length and balance with use of RW. Tandem walk and heel toe not attempted due to RW use.  Reflexes: 1+ and symmetric. Toes downgoing.      NIHSS  9 Modified Rankin  3-4      ASSESSMENT: KYIARA TRANK is a 76 y.o. year old female with right MCA infarcts (involving insula, anterior temporal lobe and inferior frontal lobe) on 05/01/2021 likely due to right M1 and proximal stenosis/occlusion in setting of sepsis, septic shock and hypotension in setting of UTI s/p left ureteral stent for ureteral stone. Vascular risk factors include A-fib on Xarelto, HLD, former tobacco use, history of hemorrhagic stroke 2009 without residual deficit, CAD 2002 with MI and triple bypass 2015, migraines (managed by PCP) and CHF.      PLAN:  R MCA strokes :  Residual deficit: left hemiparesis with LUE sensory impairment and shoulder pain, worsening baseline cognition, occasional dysphagia and gait impairment. Continue HH PT/OT. Discussed benefit with SLP but would like to hold off. Discussed importance of maintaining appropriate diet to decrease risk of aspiration pneumonia  Continue aspirin 81 mg daily and Xarelto (rivaroxaban) daily  and pravastatin 40 mg daily for secondary stroke prevention.   Discussed secondary stroke prevention measures and importance of close PCP follow up for aggressive stroke risk factor management. I have gone over the pathophysiology of stroke, warning signs and symptoms, risk factors and their management in some detail with instructions to go to the closest emergency room for symptoms of concern. Atrial fibrillation: On Xarelto chronically.  Advised to schedule follow-up visit with prior cardiologist as no recent visit.  HLD: LDL goal <70. Recent LDL 98.   Continue pravastatin 40 mg daily Surgical clearance: Briefly discussed typical protocol for holding blood thinners post stroke. If procedure is emergent where  benefit of procedure would outweigh risk of recurrent stroke, can proceed although did discuss increase risk of stroke within 3-6 month post stroke which husband verbalized understanding. Discussed possible use of bridging anticoagulation with Lovenox but will defer to cardiology to make that decision. Awaiting paperwork from [facility] for completion.     Follow up in 4 months or call earlier if needed   CC:  [place] provider: Dr. Leonie Man PCP: [name], Corene Cornea, MD    I spent 59 minutes of face-to-face and non-face-to-face time with patient and husband.  This included previsit chart review including review of recent hospitalization, lab review, study review, electronic health record documentation, patient and husband's education regarding recent stroke including etiology, secondary stroke prevention measures and importance of managing stroke risk factors, residual deficits and typical recovery time and answered all other questions as noted above to patient and husband's satisfaction  Frann Rider, AGNP-BC  [facility] [address] [place], [place] [address]  Phone [phone] Fax [phone] Note: This document was prepared with digital dictation and possible smart phrase technology. Any transcriptional errors that result from this process are unintentional.

## 2021-06-07 NOTE — Telephone Encounter (Signed)
Received fax from [facility] re: surgical clearance for cystoscopy, left stent exchange, ureteroscopy, laser lithotripsy.  Surgery date pending clearance. Placed on NP's desk.

## 2021-06-08 ENCOUNTER — Telehealth: Payer: Self-pay | Admitting: Cardiology

## 2021-06-08 ENCOUNTER — Other Ambulatory Visit: Payer: Self-pay | Admitting: Physical Medicine and Rehabilitation

## 2021-06-08 ENCOUNTER — Inpatient Hospital Stay: Payer: Medicare HMO | Admitting: Adult Health

## 2021-06-08 ENCOUNTER — Ambulatory Visit: Payer: Medicare HMO | Admitting: Allergy and Immunology

## 2021-06-08 DIAGNOSIS — I10 Essential (primary) hypertension: Secondary | ICD-10-CM | POA: Diagnosis not present

## 2021-06-08 DIAGNOSIS — I679 Cerebrovascular disease, unspecified: Secondary | ICD-10-CM | POA: Diagnosis not present

## 2021-06-08 NOTE — Telephone Encounter (Signed)
Surgical clearance form and NP's note faxed to [facility]. Received confirmation.

## 2021-06-08 NOTE — Telephone Encounter (Signed)
At recent visit, did offer this but husband preferred to discuss with PCP and planned to obtain referral from their office. No problem with placing referral. Thank you!

## 2021-06-08 NOTE — Telephone Encounter (Signed)
° °  Pre-operative Risk Assessment    Patient Name: Jenny Kelly  DOB: 20-Dec-1945 MRN: [id number]      Request for Surgical Clearance    Procedure:   cystoscopy  left stent exchange, ureteroscopy laser lithotripsy  Date of Surgery:  Clearance TBD                                 Surgeon:  Dr. Rhoderick Moody Surgeon's Group or Practice Name:  [facility] Phone number:  [phone] Fax number:  [phone]   Type of Clearance Requested:   - Medical  - Pharmacy:  Hold Aspirin and Rivaroxaban (Xarelto) Aspirin needs to be held 5 days prior and Xarelto needs to be held 3 days prior to surgery.  Her neurologist recommends a  lovenox bridge.   Type of Anesthesia:  General    Additional requests/questions:    Emmaline Kluver   06/08/2021, 4:31 PM

## 2021-06-08 NOTE — Addendum Note (Signed)
Addended by: Minna Antis on: 06/08/2021 03:16 PM   Modules accepted: Orders

## 2021-06-08 NOTE — Telephone Encounter (Signed)
Please send with clearance request  Prolonged discussion at hospital follow-up visit on 06/07/2021 with patient and husband.  Discussed risk versus benefit of undergoing procedure with recent stroke.  Typically recommend waiting at least 3 to 6 months post stroke (based on type of stroke and risk factors) for any type of elective procedure. In patients particular case, if this procedure needs to be done emergently where risk of not having procedure would be higher than risk of stroke, she is cleared for procedure from stroke standpoint. This was fully discussed with patient and husband who verbalized understanding of risk of stroke while off therapy.  Okay to hold aspirin as requested for 5 days prior and Xarelto as requested for 3 days prior but do recommend Lovenox bridge to lower risk of recurrent stroke with A-fib history.  She was encouraged to schedule a follow-up visit with cardiology to also further discuss.

## 2021-06-08 NOTE — Telephone Encounter (Signed)
Received call from [facility] who stated patient needs referral sent to [facility] [place]. She stated patient hasn't seen them recently enough to go for follow up for surgical clearance, needs new referral. Junious Dresser asked for it to be placed with first available MD. I advised will take care of it. Junious Dresser verbalized understanding, appreciation.

## 2021-06-09 DIAGNOSIS — G4730 Sleep apnea, unspecified: Secondary | ICD-10-CM | POA: Insufficient documentation

## 2021-06-09 DIAGNOSIS — D649 Anemia, unspecified: Secondary | ICD-10-CM | POA: Insufficient documentation

## 2021-06-09 DIAGNOSIS — M1990 Unspecified osteoarthritis, unspecified site: Secondary | ICD-10-CM | POA: Insufficient documentation

## 2021-06-09 DIAGNOSIS — I219 Acute myocardial infarction, unspecified: Secondary | ICD-10-CM | POA: Insufficient documentation

## 2021-06-09 DIAGNOSIS — I509 Heart failure, unspecified: Secondary | ICD-10-CM | POA: Insufficient documentation

## 2021-06-09 DIAGNOSIS — D689 Coagulation defect, unspecified: Secondary | ICD-10-CM | POA: Insufficient documentation

## 2021-06-09 DIAGNOSIS — R569 Unspecified convulsions: Secondary | ICD-10-CM | POA: Insufficient documentation

## 2021-06-09 DIAGNOSIS — J45909 Unspecified asthma, uncomplicated: Secondary | ICD-10-CM | POA: Insufficient documentation

## 2021-06-09 DIAGNOSIS — G709 Myoneural disorder, unspecified: Secondary | ICD-10-CM | POA: Insufficient documentation

## 2021-06-09 DIAGNOSIS — R011 Cardiac murmur, unspecified: Secondary | ICD-10-CM | POA: Insufficient documentation

## 2021-06-09 DIAGNOSIS — F32A Depression, unspecified: Secondary | ICD-10-CM | POA: Insufficient documentation

## 2021-06-09 DIAGNOSIS — F419 Anxiety disorder, unspecified: Secondary | ICD-10-CM | POA: Insufficient documentation

## 2021-06-09 DIAGNOSIS — Z5189 Encounter for other specified aftercare: Secondary | ICD-10-CM | POA: Insufficient documentation

## 2021-06-09 DIAGNOSIS — H269 Unspecified cataract: Secondary | ICD-10-CM | POA: Insufficient documentation

## 2021-06-09 DIAGNOSIS — T7840XA Allergy, unspecified, initial encounter: Secondary | ICD-10-CM | POA: Insufficient documentation

## 2021-06-09 DIAGNOSIS — K219 Gastro-esophageal reflux disease without esophagitis: Secondary | ICD-10-CM | POA: Insufficient documentation

## 2021-06-09 DIAGNOSIS — J449 Chronic obstructive pulmonary disease, unspecified: Secondary | ICD-10-CM | POA: Insufficient documentation

## 2021-06-09 HISTORY — DX: Allergy, unspecified, initial encounter: T78.40XA

## 2021-06-09 NOTE — Telephone Encounter (Signed)
Pt has appt with Dr. Bing Matter 06/12/21. Will add pre op clearance needed to appt notes. Will send notes to MD for upcoming appt. Will send FYI to requesting office the pt has appt 06/12/21.

## 2021-06-09 NOTE — Telephone Encounter (Signed)
Patient has an upcoming appointment with Dr. Joycelyn Rua on 06/12/2021 following her admission. She will need to be risk stratified at that time. Primary cardiologist will also need to reach out to the pharmacy team to get recommendations on holding of Xarelto, particularly with neurology recommending Lovenox bridge. Please add preoperative clearance to the appointment notes and ensure patient and cardiologist know to address these issues at that time.

## 2021-06-12 ENCOUNTER — Encounter: Payer: Self-pay | Admitting: Cardiology

## 2021-06-12 ENCOUNTER — Other Ambulatory Visit: Payer: Self-pay

## 2021-06-12 ENCOUNTER — Ambulatory Visit: Payer: Medicare HMO | Admitting: Cardiology

## 2021-06-12 VITALS — BP 100/60 | HR 87 | Ht 62.0 in | Wt 144.0 lb

## 2021-06-12 DIAGNOSIS — J8410 Pulmonary fibrosis, unspecified: Secondary | ICD-10-CM | POA: Diagnosis not present

## 2021-06-12 DIAGNOSIS — I480 Paroxysmal atrial fibrillation: Secondary | ICD-10-CM

## 2021-06-12 DIAGNOSIS — I2510 Atherosclerotic heart disease of native coronary artery without angina pectoris: Secondary | ICD-10-CM

## 2021-06-12 DIAGNOSIS — I63511 Cerebral infarction due to unspecified occlusion or stenosis of right middle cerebral artery: Secondary | ICD-10-CM

## 2021-06-12 NOTE — Addendum Note (Signed)
Addended by: Edwyna Shell I on: 06/12/2021 09:42 AM   Modules accepted: Orders

## 2021-06-12 NOTE — Patient Instructions (Addendum)
Medication Instructions:  °Your physician recommends that you continue on your current medications as directed. Please refer to the Current Medication list given to you today. ° °*If you need a refill on your cardiac medications before your next appointment, please call your pharmacy* ° ° °Lab Work: °Your physician recommends that you return for lab work in: Labs today: BMP °If you have labs (blood work) drawn today and your tests are completely normal, you will receive your results only by: °MyChart Message (if you have MyChart) OR °A paper copy in the mail °If you have any lab test that is abnormal or we need to change your treatment, we will call you to review the results. ° ° °Testing/Procedures: °None ° ° °Follow-Up: °At [facility], you and your health needs are our priority.  As part of our continuing mission to provide you with exceptional heart care, we have created designated Provider Care Teams.  These Care Teams include your primary Cardiologist (physician) and Advanced Practice Providers (APPs -  Physician Assistants and Nurse Practitioners) who all work together to provide you with the care you need, when you need it. ° °We recommend signing up for the patient portal called "MyChart".  Sign up information is provided on this After Visit Summary.  MyChart is used to connect with patients for Virtual Visits (Telemedicine).  Patients are able to view lab/test results, encounter notes, upcoming appointments, etc.  Non-urgent messages can be sent to your provider as well.   °To learn more about what you can do with MyChart, go to [email or web address].com.   ° °Your next appointment:   °3 month(s) ° °The format for your next appointment:   °In Person ° °Provider:   °Robert Krasowski, MD  ° ° °Other Instructions °None ° °

## 2021-06-12 NOTE — Progress Notes (Signed)
Cardiology Office Note:    Date:  06/12/2021   ID:  VERNADINE ASKER, DOB April 09, 1946, MRN [id number]  PCP:  Townsend Roger, MD  Cardiologist:  Jenne Campus, MD    Referring MD: Frann Rider, NP   Chief Complaint  Patient presents with   Pre-op Exam    Removal of stent, break up kidney stone and put stent back in    History of Present Illness:    BRALEY DIMURO is a 76 y.o. female past medical history significant for coronary artery disease, she did have coronary artery bypass grafting x3 in 2015.  Also history of kidney disease, history of hyperlipidemia, history of PE, paroxysmal atrial fibrillation, diabetes, seizures, thyroid disorder, COPD.  At the beginning of January she presented to hospital because of stroke.  Interesting at that time she was taking Xarelto already.  The therapy has been augmented with addition of aspirin.  However she was also find to have urinary tract infection was diagnosed to have kidney stone.  Stent has been implanted my understanding is that surgery is contemplated.  Surgery should include stent removal, crushing of the stone as well as putting a stent in.  And the difficulties that we facing is the fact that anticoagulation need to be stopped for the procedure.  Overall she says she is doing well.  Still complaining of weakness fatigue in the left side of her body.  Denies have any palpitations.  Recently seen neurology team that recommended bridging her with Lovenox.  She does not have any cardiac complaints this time.  There is no palpitations no chest pain tightness squeezing pressure mid chest.  When she will have episode of atrial fibrillation she was clearly symptomatic.  Past Medical History:  Diagnosis Date   Allergy    Anemia    Anxiety    Arthritis    Asthma    Atrial fibrillation ([place])    Blood transfusion without reported diagnosis    Cataract    CHF (congestive heart failure) (HCC)    Chronic kidney disease    Clotting disorder (HCC)     COPD (chronic obstructive pulmonary disease) ([place])    Depression    Diabetes mellitus without complication (HCC)    GERD (gastroesophageal reflux disease)    Heart murmur    Hyperlipidemia    Myocardial infarction ([place])    Neuromuscular disorder (HCC)    tremors   Seizures ([place])    Sleep apnea    Stroke ([place])    Thyroid disease     Past Surgical History:  Procedure Laterality Date   ABDOMINAL HYSTERECTOMY     APPENDECTOMY     bladder tack     CHOLECYSTECTOMY     CORONARY ARTERY BYPASS GRAFT     CYSTOSCOPY W/ URETERAL STENT PLACEMENT Left 05/01/2021   Procedure: CYSTOSCOPY WITH RETROGRADE PYELOGRAM/URETERAL STENT PLACEMENT;  Surgeon: Ceasar Mons, MD;  Location: [place];  Service: Urology;  Laterality: Left;   ESOPHAGOGASTRODUODENOSCOPY ENDOSCOPY     EYE SURGERY     FRACTURE SURGERY Right    3rd finger   JOINT REPLACEMENT Bilateral    knees   TONSILLECTOMY AND ADENOIDECTOMY     TUBAL LIGATION      Current Medications: Current Meds  Medication Sig   albuterol (PROVENTIL HFA) 108 (90 Base) MCG/ACT inhaler Inhale two puffs every 4-6 hours if needed for coughing, wheezing, or shortness of breath.   ANORO ELLIPTA 62.5-25 MCG/ACT AEPB TAKE 1 PUFF BY MOUTH EVERY DAY  aspirin 81 MG EC tablet Take 1 tablet (81 mg total) by mouth daily. Swallow whole.   azelastine (ASTELIN) 0.1 % nasal spray Use one spray in each nostril twice daily   budesonide (PULMICORT) 0.5 MG/2ML nebulizer solution Take 2 mLs (0.5 mg total) by nebulization in the morning and at bedtime.   butalbital-acetaminophen-caffeine (FIORICET) 50-325-40 MG tablet Take 1 tablet by mouth daily as needed for migraine.   Cyanocobalamin (B-12 PO) Take by mouth.   donepezil (ARICEPT) 10 MG tablet Take 1 tablet (10 mg total) by mouth at bedtime.   famotidine (PEPCID) 20 MG tablet Take one tablet once daily   fluticasone (FLONASE) 50 MCG/ACT nasal spray SPRAY 1 SPRAY INTO BOTH NOSTRILS DAILY.   ipratropium  (ATROVENT) 0.03 % nasal spray SPRAY 2 SPRAYS INTO BOTH NOSTRILS EVERY 12 HOURS.   levothyroxine (SYNTHROID) 88 MCG tablet Take 88 mcg by mouth daily before breakfast.   lipase/protease/amylase (CREON) 12000-38000 units CPEP capsule Take 1 capsule (12,000 Units total) by mouth 3 (three) times daily before meals.   montelukast (SINGULAIR) 10 MG tablet Take 1 tablet (10 mg total) by mouth at bedtime.   Multiple Vitamin (MULTIVITAMIN WITH MINERALS) TABS tablet Take 1 tablet by mouth daily.   omeprazole (PRILOSEC) 40 MG capsule Take 1 capsule (40 mg total) by mouth daily.   Phenazopyridine HCl (URISTAT PO) Take 1 tablet by mouth as needed (UTI).   pravastatin (PRAVACHOL) 40 MG tablet Take 1 tablet (40 mg total) by mouth daily at 6 PM.   pregabalin (LYRICA) 50 MG capsule Take 1 capsule (50 mg total) by mouth 2 (two) times daily.   Rivaroxaban (XARELTO) 15 MG TABS tablet Take 1 tablet (15 mg total) by mouth daily.   topiramate (TOPAMAX) 200 MG tablet Take 1 tablet (200 mg total) by mouth 2 (two) times daily.     Allergies:   Ciprofloxacin, Prochlorperazine edisylate, Prednisone, Reduced iso-alpha acids complex, Statins, Sulfa antibiotics, Azithromycin, and Latex   Social History   Socioeconomic History   Marital status: Married    Spouse name: Ulice Dash   Number of children: 3   Years of education: Not on file   Highest education level: Not on file  Occupational History   Not on file  Tobacco Use   Smoking status: Former    Packs/day: 0.50    Years: 30.00    Pack years: 15.00    Types: Cigarettes    Quit date: 06/26/2000    Years since quitting: 20.9   Smokeless tobacco: Never  Vaping Use   Vaping Use: Former  Substance and Sexual Activity   Alcohol use: Not Currently   Drug use: Never   Sexual activity: Not on file  Other Topics Concern   Not on file  Social History Narrative   Not on file   Social Determinants of Health   Financial Resource Strain: Not on file  Food Insecurity: Not  on file  Transportation Needs: Not on file  Physical Activity: Not on file  Stress: Not on file  Social Connections: Not on file     Family History: The patient's family history includes Breast cancer in her mother; Heart attack in her mother; Heart disease in her mother and sister. ROS:   Please see the history of present illness.    All 14 point review of systems negative except as described per history of present illness  EKGs/Labs/Other Studies Reviewed:      Recent Labs: 05/01/2021: B Natriuretic Peptide 493.7 05/02/2021: ALT 9; TSH  0.784 05/04/2021: Magnesium 2.3 05/14/2021: Hemoglobin 9.8; Platelets 275 05/16/2021: BUN 19; Creatinine, Ser 1.48; Potassium 3.6; Sodium 140  Recent Lipid Panel    Component Value Date/Time   CHOL 148 05/03/2021 0538   TRIG 132 05/03/2021 0538   HDL 24 (L) 05/03/2021 0538   CHOLHDL 6.2 05/03/2021 0538   VLDL 26 05/03/2021 0538   LDLCALC 98 05/03/2021 0538    Physical Exam:    VS:  BP 100/60 (BP Location: Left Arm, Patient Position: Sitting, Cuff Size: Normal)    Pulse 87    Ht 5\' 2"  (1.575 m)    Wt 144 lb (65.3 kg)    SpO2 91%    BMI 26.34 kg/m     Wt Readings from Last 3 Encounters:  06/12/21 144 lb (65.3 kg)  06/07/21 144 lb (65.3 kg)  05/14/21 150 lb 5.7 oz (68.2 kg)     GEN:  Well nourished, well developed in no acute distress HEENT: Normal NECK: No JVD; No carotid bruits LYMPHATICS: No lymphadenopathy CARDIAC: RRR, no murmurs, no rubs, no gallops RESPIRATORY: Crackles both bases ABDOMEN: Soft, non-tender, non-distended MUSCULOSKELETAL:  No edema; No deformity  SKIN: Warm and dry LOWER EXTREMITIES: no swelling NEUROLOGIC:  Alert and oriented x 3 PSYCHIATRIC:  Normal affect   ASSESSMENT:    1. PAF (paroxysmal atrial fibrillation) ([place])   2. Coronary artery disease involving native coronary artery of native heart without angina pectoris   3. Right middle cerebral artery stroke (HCC)   4. Pulmonary fibrosis (HCC)    PLAN:     In order of problems listed above:  Paroxysmal atrial fibrillation, maintained sinus rhythm, he she is anticoagulated in spite of anticoagulation she end up having stroke.  Therefore aspirin has been added.  Now her she is in sinus rhythm which I will continue present management. Coronary disease status post coronary bypass grafting 2015.  Stable from that point review. Recent stroke she is being on Xarelto as well as aspirin which I will continue.  She is maintaining sinus rhythm.  Neurology recommended bridging for procedure with Lovenox.  We will talk to urologist Dr. Lovena Neighbours to find out exactly what is the urgency with the surgery.  If we can postpone surgery for additional 2 months that will be beneficial for patient if not stopping aspirin few days before then stopping Xarelto for 48 hours could be reasonable.  The role of bridging for somebody with this, clinical scenario is unclear however with a very high risk in her situation I would recommend bridging for that. Pulmonary fibrosis.  Noted.  Continue present management.  Followed by pulmonary.   Medication Adjustments/Labs and Tests Ordered: Current medicines are reviewed at length with the patient today.  Concerns regarding medicines are outlined above.  No orders of the defined types were placed in this encounter.  Medication changes: No orders of the defined types were placed in this encounter.   Signed, Park Liter, MD, [facility] 06/12/2021 9:29 AM    [place]

## 2021-06-12 NOTE — Addendum Note (Signed)
Addended by: Roosvelt Harps R on: 06/12/2021 01:03 PM   Modules accepted: Orders

## 2021-06-13 LAB — BASIC METABOLIC PANEL
BUN/Creatinine Ratio: 15 (ref 12–28)
BUN: 26 mg/dL (ref 8–27)
CO2: 20 mmol/L (ref 20–29)
Calcium: 8.9 mg/dL (ref 8.7–10.3)
Chloride: 110 mmol/L — ABNORMAL HIGH (ref 96–106)
Creatinine, Ser: 1.71 mg/dL — ABNORMAL HIGH (ref 0.57–1.00)
Glucose: 114 mg/dL — ABNORMAL HIGH (ref 70–99)
Potassium: 4 mmol/L (ref 3.5–5.2)
Sodium: 145 mmol/L — ABNORMAL HIGH (ref 134–144)
eGFR: 31 mL/min/{1.73_m2} — ABNORMAL LOW (ref >59)

## 2021-06-14 ENCOUNTER — Telehealth: Payer: Self-pay

## 2021-06-14 DIAGNOSIS — I480 Paroxysmal atrial fibrillation: Secondary | ICD-10-CM

## 2021-06-14 MED ORDER — RIVAROXABAN 15 MG PO TABS: 15.0000 mg | ORAL_TABLET | Freq: Every day | ORAL | 0 refills | Status: DC

## 2021-06-14 NOTE — Telephone Encounter (Signed)
Spoke with [name], patient spouse, notified of results. He states the patient has 1 pill left of Xarelto. Message sent to Anticoagulant team with high priority.

## 2021-06-14 NOTE — Telephone Encounter (Signed)
-----   Message from Georgeanna Lea, MD sent at 06/13/2021 10:51 AM EST ----- Kidney function is slightly worse than before.  Continue present management.  Tell her that I still did not hear anything from her urologist thank

## 2021-06-14 NOTE — Telephone Encounter (Signed)
Refill sent to pharmacy.   

## 2021-06-15 DIAGNOSIS — I4891 Unspecified atrial fibrillation: Secondary | ICD-10-CM | POA: Diagnosis not present

## 2021-06-15 DIAGNOSIS — I679 Cerebrovascular disease, unspecified: Secondary | ICD-10-CM | POA: Diagnosis not present

## 2021-06-16 NOTE — Telephone Encounter (Signed)
Spoke with Fayrene Fearing, notified of sent script

## 2021-06-19 ENCOUNTER — Other Ambulatory Visit: Payer: Self-pay | Admitting: Pulmonary Disease

## 2021-06-19 DIAGNOSIS — J439 Emphysema, unspecified: Secondary | ICD-10-CM

## 2021-06-21 ENCOUNTER — Ambulatory Visit: Payer: Medicare HMO | Admitting: Cardiology

## 2021-06-27 ENCOUNTER — Other Ambulatory Visit: Payer: Self-pay

## 2021-06-27 ENCOUNTER — Encounter: Payer: Self-pay | Admitting: Physical Medicine and Rehabilitation

## 2021-06-27 ENCOUNTER — Encounter: Payer: Medicare HMO | Attending: Physical Medicine and Rehabilitation | Admitting: Physical Medicine and Rehabilitation

## 2021-06-27 VITALS — BP 126/84 | HR 79 | Ht 62.0 in | Wt 145.0 lb

## 2021-06-27 DIAGNOSIS — I63311 Cerebral infarction due to thrombosis of right middle cerebral artery: Secondary | ICD-10-CM | POA: Diagnosis not present

## 2021-06-27 DIAGNOSIS — M25512 Pain in left shoulder: Secondary | ICD-10-CM

## 2021-06-27 NOTE — Progress Notes (Signed)
? ?Subjective:  ? ? Patient ID: Jenny Kelly, female    DOB: [birth year], 76 y.o.   MRN: [id number] ? ?HPI ?Jenny Kelly is a 76 year old woman who presents for hospital follow-up for CVA. ? ?1) Kidney stone: ?-she has pain every time she urinates ?-throbbing  ?-she received something for spasms Dr. Dr. Liliane Shi ?-he was concerned that if they use the laser if would cause a lot of bleeding.  ?-he has already treated her for another UTI.  ? ?2) Pain: ?-she takes aspirin, topiramate, and lyrica.  ? ?3) Migraines: ?-pretty well controlled ? ?Pain Inventory ?Average Pain 4 ?Pain Right Now 5 ?My pain is sharp and throbbing ? ?LOCATION OF PAIN  left shoulder, groin ? ?BOWEL ?Number of stools per week: 7 ? ? ? ?BLADDER ?Normal ?Bladder incontinence Yes  ?Frequent urination No  ?Leakage with coughing No  ?Difficulty starting stream No  ?Incomplete bladder emptying No  ? ? ?Mobility ?walk with assistance ?use a walker ?ability to climb steps?  no ?do you drive?  no ? ?Function ?retired ? ?Neuro/Psych ?trouble walking ?confusion ?depression ? ?Prior Studies ?CT/MRI ? ?Physicians involved in your care ?Neurologist Ihor Austin, [place] ?Cardiology Dr. Kirtland Bouchard ? ? ?Family History  ?Problem Relation Age of Onset  ? Heart disease Mother   ? Heart attack Mother   ? Breast cancer Mother   ? Heart disease Sister   ? ?Social History  ? ?Socioeconomic History  ? Marital status: Married  ?  Spouse name: Vonna Kotyk  ? Number of children: 3  ? Years of education: Not on file  ? Highest education level: Not on file  ?Occupational History  ? Not on file  ?Tobacco Use  ? Smoking status: Former  ?  Packs/day: 0.50  ?  Years: 30.00  ?  Pack years: 15.00  ?  Types: Cigarettes  ?  Quit date: 06/26/2000  ?  Years since quitting: 21.0  ? Smokeless tobacco: Never  ?Vaping Use  ? Vaping Use: Former  ?Substance and Sexual Activity  ? Alcohol use: Not Currently  ? Drug use: Never  ? Sexual activity: Not on file  ?Other Topics Concern  ? Not on file  ?Social History  Narrative  ? Not on file  ? ?Social Determinants of Health  ? ?Financial Resource Strain: Not on file  ?Food Insecurity: Not on file  ?Transportation Needs: Not on file  ?Physical Activity: Not on file  ?Stress: Not on file  ?Social Connections: Not on file  ? ?Past Surgical History:  ?Procedure Laterality Date  ? ABDOMINAL HYSTERECTOMY    ? APPENDECTOMY    ? bladder tack    ? CHOLECYSTECTOMY    ? CORONARY ARTERY BYPASS GRAFT    ? CYSTOSCOPY W/ URETERAL STENT PLACEMENT Left 05/01/2021  ? Procedure: CYSTOSCOPY WITH RETROGRADE PYELOGRAM/URETERAL STENT PLACEMENT;  Surgeon: Rene Paci, MD;  Location: [facility] OR;  Service: Urology;  Laterality: Left;  ? ESOPHAGOGASTRODUODENOSCOPY ENDOSCOPY    ? EYE SURGERY    ? FRACTURE SURGERY Right   ? 3rd finger  ? JOINT REPLACEMENT Bilateral   ? knees  ? TONSILLECTOMY AND ADENOIDECTOMY    ? TUBAL LIGATION    ? ?Past Medical History:  ?Diagnosis Date  ? Allergy   ? Anemia   ? Anxiety   ? Arthritis   ? Asthma   ? Atrial fibrillation (HCC)   ? Blood transfusion without reported diagnosis   ? Cataract   ? CHF (congestive heart failure) (  HCC)   ? Chronic kidney disease   ? Clotting disorder (HCC)   ? COPD (chronic obstructive pulmonary disease) (HCC)   ? Depression   ? Diabetes mellitus without complication (HCC)   ? GERD (gastroesophageal reflux disease)   ? Heart murmur   ? Hyperlipidemia   ? Myocardial infarction [facility])   ? Neuromuscular disorder (HCC)   ? tremors  ? Seizures (HCC)   ? Sleep apnea   ? Stroke [facility])   ? Thyroid disease   ? ?BP 126/84   Pulse 79   Ht 5\' 2"  (1.575 m)   Wt 145 lb (65.8 kg)   SpO2 93%   BMI 26.52 kg/m?  ? ?Opioid Risk Score:   ?Fall Risk Score:  `1 ? ?Depression screen PHQ 2/9 ? ?Depression screen [facility] 2/9 06/27/2021 12/11/2018 05/08/2018 05/08/2018 03/25/2018  ?Decreased Interest 1 1 - 0 0  ?Down, Depressed, Hopeless 0 1 3 0 0  ?PHQ - 2 Score 1 2 3  0 0  ?Altered sleeping 2 0 3 - -  ?Tired, decreased energy 2 3 2  - -  ?Change in appetite 0 0 0 - -   ?Feeling bad or failure about yourself  0 0 0 - -  ?Trouble concentrating 0 0 1 - -  ?Moving slowly or fidgety/restless 0 0 1 - -  ?Suicidal thoughts 0 0 0 - -  ?PHQ-9 Score 5 5 10  - -  ?Difficult doing work/chores - Not difficult at all Somewhat difficult - -  ?  ? ?Review of Systems  ?Constitutional: Negative.   ?HENT: Negative.    ?Eyes: Negative.   ?Respiratory: Negative.    ?Cardiovascular: Negative.   ?Gastrointestinal: Negative.   ?Endocrine: Negative.   ?Genitourinary:  Positive for dysuria, pelvic pain and urgency.  ?Musculoskeletal:  Positive for gait problem.  ?Skin: Negative.   ?Allergic/Immunologic: Negative.   ?Psychiatric/Behavioral:  Positive for confusion and dysphoric mood.   ? ?   ?Objective:  ? Physical Exam ? ?Gen: no distress, normal appearing ?HEENT: oral mucosa pink and moist, NCAT ?Cardio: Reg rate ?Chest: normal effort, normal rate of breathing ?Abd: soft, non-distended ?Ext: no edema ?Psych: pleasant, normal affect ?Skin: intact ?Neuro: Alert and oriented ?Musculoskeletal: 4/5 strength throughout ?   ?Assessment & Plan:  ? ?1) CVA ?-continue therapies ?-would benefit from handicap placard to increase mobility in the community ?-reviewed all medications and provided necessary refills. ?-discussed vagal nerve stimulation if strength fails to improve in 6 months.  ? ?2) Left shoulder pain ?-continue lidocaine patches.  ?-discussed voltaren gel ?-Provided with a pain relief journal and discussed that it contains foods and lifestyle tips to naturally help to improve pain. Discussed that these lifestyle strategies are also very good for health unlike some medications which can have negative side effects. Discussed that the act of keeping a journal can be therapeutic and helpful to realize patterns what helps to trigger and alleviate pain.   ? ?3) Kidney stone ?-continue urostat ?-provided list of foods that can help with kidney stones.  ? ?4) Decreased energy: ?-discussed modafinil, would  recommend clearing with cardiology before using it.  ? ? ?

## 2021-06-27 NOTE — Patient Instructions (Addendum)
Modafinil

## 2021-06-30 ENCOUNTER — Other Ambulatory Visit: Payer: Self-pay | Admitting: Physical Medicine and Rehabilitation

## 2021-06-30 DIAGNOSIS — N390 Urinary tract infection, site not specified: Secondary | ICD-10-CM | POA: Diagnosis not present

## 2021-06-30 DIAGNOSIS — N201 Calculus of ureter: Secondary | ICD-10-CM | POA: Diagnosis not present

## 2021-07-01 ENCOUNTER — Other Ambulatory Visit: Payer: Self-pay

## 2021-07-01 ENCOUNTER — Encounter (HOSPITAL_COMMUNITY): Payer: Self-pay | Admitting: *Deleted

## 2021-07-01 ENCOUNTER — Emergency Department (HOSPITAL_COMMUNITY): Payer: Medicare HMO

## 2021-07-01 ENCOUNTER — Inpatient Hospital Stay (HOSPITAL_COMMUNITY)
Admission: EM | Admit: 2021-07-01 | Discharge: 2021-07-07 | DRG: 871 | Disposition: A | Discharge status: Home Health | Payer: Medicare HMO | Attending: Internal Medicine | Admitting: Internal Medicine

## 2021-07-01 DIAGNOSIS — Z951 Presence of aortocoronary bypass graft: Secondary | ICD-10-CM

## 2021-07-01 DIAGNOSIS — Z881 Allergy status to other antibiotic agents status: Secondary | ICD-10-CM

## 2021-07-01 DIAGNOSIS — Z955 Presence of coronary angioplasty implant and graft: Secondary | ICD-10-CM

## 2021-07-01 DIAGNOSIS — N12 Tubulo-interstitial nephritis, not specified as acute or chronic: Principal | ICD-10-CM

## 2021-07-01 DIAGNOSIS — Z7982 Long term (current) use of aspirin: Secondary | ICD-10-CM

## 2021-07-01 DIAGNOSIS — R6521 Severe sepsis with septic shock: Secondary | ICD-10-CM | POA: Diagnosis not present

## 2021-07-01 DIAGNOSIS — R6520 Severe sepsis without septic shock: Secondary | ICD-10-CM | POA: Diagnosis not present

## 2021-07-01 DIAGNOSIS — E1122 Type 2 diabetes mellitus with diabetic chronic kidney disease: Secondary | ICD-10-CM | POA: Diagnosis present

## 2021-07-01 DIAGNOSIS — I5032 Chronic diastolic (congestive) heart failure: Secondary | ICD-10-CM | POA: Diagnosis present

## 2021-07-01 DIAGNOSIS — Z79890 Hormone replacement therapy (postmenopausal): Secondary | ICD-10-CM

## 2021-07-01 DIAGNOSIS — N390 Urinary tract infection, site not specified: Secondary | ICD-10-CM | POA: Diagnosis not present

## 2021-07-01 DIAGNOSIS — Z87891 Personal history of nicotine dependence: Secondary | ICD-10-CM

## 2021-07-01 DIAGNOSIS — Z20822 Contact with and (suspected) exposure to covid-19: Secondary | ICD-10-CM | POA: Diagnosis present

## 2021-07-01 DIAGNOSIS — F05 Delirium due to known physiological condition: Secondary | ICD-10-CM | POA: Diagnosis not present

## 2021-07-01 DIAGNOSIS — Z7951 Long term (current) use of inhaled steroids: Secondary | ICD-10-CM

## 2021-07-01 DIAGNOSIS — R918 Other nonspecific abnormal finding of lung field: Secondary | ICD-10-CM | POA: Diagnosis not present

## 2021-07-01 DIAGNOSIS — A419 Sepsis, unspecified organism: Principal | ICD-10-CM | POA: Diagnosis present

## 2021-07-01 DIAGNOSIS — J849 Interstitial pulmonary disease, unspecified: Secondary | ICD-10-CM | POA: Diagnosis not present

## 2021-07-01 DIAGNOSIS — E785 Hyperlipidemia, unspecified: Secondary | ICD-10-CM | POA: Diagnosis present

## 2021-07-01 DIAGNOSIS — M79603 Pain in arm, unspecified: Secondary | ICD-10-CM | POA: Diagnosis not present

## 2021-07-01 DIAGNOSIS — Z888 Allergy status to other drugs, medicaments and biological substances status: Secondary | ICD-10-CM

## 2021-07-01 DIAGNOSIS — I69334 Monoplegia of upper limb following cerebral infarction affecting left non-dominant side: Secondary | ICD-10-CM | POA: Diagnosis not present

## 2021-07-01 DIAGNOSIS — M1990 Unspecified osteoarthritis, unspecified site: Secondary | ICD-10-CM | POA: Diagnosis present

## 2021-07-01 DIAGNOSIS — K219 Gastro-esophageal reflux disease without esophagitis: Secondary | ICD-10-CM | POA: Diagnosis present

## 2021-07-01 DIAGNOSIS — F0390 Unspecified dementia without behavioral disturbance: Secondary | ICD-10-CM | POA: Diagnosis present

## 2021-07-01 DIAGNOSIS — R569 Unspecified convulsions: Secondary | ICD-10-CM

## 2021-07-01 DIAGNOSIS — Z91040 Latex allergy status: Secondary | ICD-10-CM

## 2021-07-01 DIAGNOSIS — Z882 Allergy status to sulfonamides status: Secondary | ICD-10-CM

## 2021-07-01 DIAGNOSIS — G40909 Epilepsy, unspecified, not intractable, without status epilepticus: Secondary | ICD-10-CM | POA: Diagnosis present

## 2021-07-01 DIAGNOSIS — J9611 Chronic respiratory failure with hypoxia: Secondary | ICD-10-CM | POA: Diagnosis present

## 2021-07-01 DIAGNOSIS — G4730 Sleep apnea, unspecified: Secondary | ICD-10-CM | POA: Diagnosis present

## 2021-07-01 DIAGNOSIS — R509 Fever, unspecified: Secondary | ICD-10-CM | POA: Diagnosis not present

## 2021-07-01 DIAGNOSIS — K8689 Other specified diseases of pancreas: Secondary | ICD-10-CM | POA: Diagnosis not present

## 2021-07-01 DIAGNOSIS — F32A Depression, unspecified: Secondary | ICD-10-CM | POA: Diagnosis present

## 2021-07-01 DIAGNOSIS — I480 Paroxysmal atrial fibrillation: Secondary | ICD-10-CM | POA: Diagnosis present

## 2021-07-01 DIAGNOSIS — N200 Calculus of kidney: Secondary | ICD-10-CM | POA: Diagnosis not present

## 2021-07-01 DIAGNOSIS — N202 Calculus of kidney with calculus of ureter: Secondary | ICD-10-CM | POA: Diagnosis not present

## 2021-07-01 DIAGNOSIS — I700 Atherosclerosis of aorta: Secondary | ICD-10-CM | POA: Diagnosis not present

## 2021-07-01 DIAGNOSIS — Z66 Do not resuscitate: Secondary | ICD-10-CM | POA: Diagnosis present

## 2021-07-01 DIAGNOSIS — K861 Other chronic pancreatitis: Secondary | ICD-10-CM | POA: Diagnosis present

## 2021-07-01 DIAGNOSIS — R9431 Abnormal electrocardiogram [ECG] [EKG]: Secondary | ICD-10-CM | POA: Diagnosis present

## 2021-07-01 DIAGNOSIS — R4182 Altered mental status, unspecified: Secondary | ICD-10-CM | POA: Diagnosis not present

## 2021-07-01 DIAGNOSIS — R0689 Other abnormalities of breathing: Secondary | ICD-10-CM | POA: Diagnosis not present

## 2021-07-01 DIAGNOSIS — Z1611 Resistance to penicillins: Secondary | ICD-10-CM | POA: Diagnosis present

## 2021-07-01 DIAGNOSIS — N183 Chronic kidney disease, stage 3 unspecified: Secondary | ICD-10-CM | POA: Diagnosis present

## 2021-07-01 DIAGNOSIS — I959 Hypotension, unspecified: Secondary | ICD-10-CM | POA: Diagnosis not present

## 2021-07-01 DIAGNOSIS — N1832 Chronic kidney disease, stage 3b: Secondary | ICD-10-CM | POA: Diagnosis present

## 2021-07-01 DIAGNOSIS — R1084 Generalized abdominal pain: Secondary | ICD-10-CM | POA: Diagnosis not present

## 2021-07-01 DIAGNOSIS — R102 Pelvic and perineal pain: Secondary | ICD-10-CM | POA: Diagnosis present

## 2021-07-01 DIAGNOSIS — Z803 Family history of malignant neoplasm of breast: Secondary | ICD-10-CM

## 2021-07-01 DIAGNOSIS — Z8249 Family history of ischemic heart disease and other diseases of the circulatory system: Secondary | ICD-10-CM

## 2021-07-01 DIAGNOSIS — Z960 Presence of urogenital implants: Secondary | ICD-10-CM

## 2021-07-01 DIAGNOSIS — I252 Old myocardial infarction: Secondary | ICD-10-CM

## 2021-07-01 DIAGNOSIS — F419 Anxiety disorder, unspecified: Secondary | ICD-10-CM | POA: Diagnosis present

## 2021-07-01 DIAGNOSIS — N201 Calculus of ureter: Secondary | ICD-10-CM | POA: Diagnosis not present

## 2021-07-01 DIAGNOSIS — N3289 Other specified disorders of bladder: Secondary | ICD-10-CM | POA: Diagnosis not present

## 2021-07-01 DIAGNOSIS — Z7901 Long term (current) use of anticoagulants: Secondary | ICD-10-CM

## 2021-07-01 DIAGNOSIS — Z79899 Other long term (current) drug therapy: Secondary | ICD-10-CM

## 2021-07-01 DIAGNOSIS — B9620 Unspecified Escherichia coli [E. coli] as the cause of diseases classified elsewhere: Secondary | ICD-10-CM | POA: Diagnosis present

## 2021-07-01 DIAGNOSIS — I10 Essential (primary) hypertension: Secondary | ICD-10-CM | POA: Diagnosis not present

## 2021-07-01 DIAGNOSIS — I130 Hypertensive heart and chronic kidney disease with heart failure and stage 1 through stage 4 chronic kidney disease, or unspecified chronic kidney disease: Secondary | ICD-10-CM | POA: Diagnosis not present

## 2021-07-01 DIAGNOSIS — E039 Hypothyroidism, unspecified: Secondary | ICD-10-CM | POA: Diagnosis present

## 2021-07-01 DIAGNOSIS — N1831 Chronic kidney disease, stage 3a: Secondary | ICD-10-CM | POA: Diagnosis present

## 2021-07-01 DIAGNOSIS — R413 Other amnesia: Secondary | ICD-10-CM | POA: Diagnosis present

## 2021-07-01 DIAGNOSIS — E876 Hypokalemia: Secondary | ICD-10-CM | POA: Diagnosis present

## 2021-07-01 DIAGNOSIS — N179 Acute kidney failure, unspecified: Secondary | ICD-10-CM | POA: Diagnosis not present

## 2021-07-01 DIAGNOSIS — D7389 Other diseases of spleen: Secondary | ICD-10-CM | POA: Diagnosis not present

## 2021-07-01 DIAGNOSIS — J439 Emphysema, unspecified: Secondary | ICD-10-CM | POA: Diagnosis present

## 2021-07-01 DIAGNOSIS — J449 Chronic obstructive pulmonary disease, unspecified: Secondary | ICD-10-CM | POA: Diagnosis present

## 2021-07-01 DIAGNOSIS — I2510 Atherosclerotic heart disease of native coronary artery without angina pectoris: Secondary | ICD-10-CM | POA: Diagnosis present

## 2021-07-01 DIAGNOSIS — E7800 Pure hypercholesterolemia, unspecified: Secondary | ICD-10-CM | POA: Diagnosis present

## 2021-07-01 DIAGNOSIS — I63511 Cerebral infarction due to unspecified occlusion or stenosis of right middle cerebral artery: Secondary | ICD-10-CM | POA: Diagnosis present

## 2021-07-01 DIAGNOSIS — J8410 Pulmonary fibrosis, unspecified: Secondary | ICD-10-CM | POA: Diagnosis present

## 2021-07-01 LAB — URINALYSIS, ROUTINE W REFLEX MICROSCOPIC
Bilirubin Urine: NEGATIVE
Glucose, UA: NEGATIVE mg/dL
Ketones, ur: NEGATIVE mg/dL
Nitrite: POSITIVE — AB
Protein, ur: NEGATIVE mg/dL
RBC / HPF: >50 RBC/hpf — ABNORMAL HIGH (ref 0–5)
Specific Gravity, Urine: 1.009 (ref 1.005–1.030)
pH: 5 (ref 5.0–8.0)

## 2021-07-01 LAB — COMPREHENSIVE METABOLIC PANEL
ALT: 6 U/L (ref 0–44)
AST: 14 U/L — ABNORMAL LOW (ref 15–41)
Albumin: 2.5 g/dL — ABNORMAL LOW (ref 3.5–5.0)
Alkaline Phosphatase: 71 U/L (ref 38–126)
Anion gap: 7 (ref 5–15)
BUN: 22 mg/dL (ref 8–23)
CO2: 21 mmol/L — ABNORMAL LOW (ref 22–32)
Calcium: 8.3 mg/dL — ABNORMAL LOW (ref 8.9–10.3)
Chloride: 115 mmol/L — ABNORMAL HIGH (ref 98–111)
Creatinine, Ser: 1.49 mg/dL — ABNORMAL HIGH (ref 0.44–1.00)
GFR, Estimated: 36 mL/min — ABNORMAL LOW (ref >60)
Glucose, Bld: 139 mg/dL — ABNORMAL HIGH (ref 70–99)
Potassium: 4.2 mmol/L (ref 3.5–5.1)
Sodium: 143 mmol/L (ref 135–145)
Total Bilirubin: 0.1 mg/dL — ABNORMAL LOW (ref 0.3–1.2)
Total Protein: 6.5 g/dL (ref 6.5–8.1)

## 2021-07-01 LAB — CBC
HCT: 37.8 % (ref 36.0–46.0)
Hemoglobin: 11.3 g/dL — ABNORMAL LOW (ref 12.0–15.0)
MCH: 27.8 pg (ref 26.0–34.0)
MCHC: 29.9 g/dL — ABNORMAL LOW (ref 30.0–36.0)
MCV: 92.9 fL (ref 80.0–100.0)
Platelets: 196 10*3/uL (ref 150–400)
RBC: 4.07 MIL/uL (ref 3.87–5.11)
RDW: 16.5 % — ABNORMAL HIGH (ref 11.5–15.5)
WBC: 13.6 10*3/uL — ABNORMAL HIGH (ref 4.0–10.5)
nRBC: 0 % (ref 0.0–0.2)

## 2021-07-01 LAB — RESP PANEL BY RT-PCR (FLU A&B, COVID) ARPGX2
Influenza A by PCR: NEGATIVE
Influenza B by PCR: NEGATIVE
SARS Coronavirus 2 by RT PCR: NEGATIVE

## 2021-07-01 LAB — PROTIME-INR
INR: 1.4 — ABNORMAL HIGH (ref 0.8–1.2)
Prothrombin Time: 17.3 seconds — ABNORMAL HIGH (ref 11.4–15.2)

## 2021-07-01 LAB — HEPARIN LEVEL (UNFRACTIONATED)
Heparin Unfractionated: 0.29 IU/mL — ABNORMAL LOW (ref 0.30–0.70)
Heparin Unfractionated: 0.19 IU/mL — ABNORMAL LOW (ref 0.30–0.70)

## 2021-07-01 LAB — APTT
aPTT: 37 seconds — ABNORMAL HIGH (ref 24–36)
aPTT: 70 seconds — ABNORMAL HIGH (ref 24–36)

## 2021-07-01 LAB — PROCALCITONIN: Procalcitonin: 0.16 ng/mL

## 2021-07-01 LAB — LACTIC ACID, PLASMA: Lactic Acid, Venous: 1.1 mmol/L (ref 0.5–1.9)

## 2021-07-01 MED ORDER — FAMOTIDINE 20 MG PO TABS
20.0000 mg | ORAL_TABLET | Freq: Every day | ORAL | Status: DC
  Administered 2021-07-01 – 2021-07-07 (×7): 20 mg via ORAL
  Filled 2021-07-01 (×7): qty 1

## 2021-07-01 MED ORDER — FENTANYL CITRATE PF 50 MCG/ML IJ SOSY
50.0000 ug | PREFILLED_SYRINGE | Freq: Once | INTRAMUSCULAR | Status: AC
Start: 2021-07-01 — End: 2021-07-01
  Administered 2021-07-01: 50 ug via INTRAVENOUS
  Filled 2021-07-01: qty 1

## 2021-07-01 MED ORDER — OXYCODONE HCL 5 MG PO TABS
5.0000 mg | ORAL_TABLET | ORAL | Status: DC | PRN
  Administered 2021-07-01 – 2021-07-03 (×6): 5 mg via ORAL
  Filled 2021-07-01 (×6): qty 1

## 2021-07-01 MED ORDER — FLUTICASONE PROPIONATE 50 MCG/ACT NA SUSP: 1.0000 | Freq: Every day | NASAL | Status: DC

## 2021-07-01 MED ORDER — TOPIRAMATE 100 MG PO TABS
200.0000 mg | ORAL_TABLET | Freq: Two times a day (BID) | ORAL | Status: DC
  Administered 2021-07-01 – 2021-07-07 (×13): 200 mg via ORAL
  Filled 2021-07-01 (×4): qty 2
  Filled 2021-07-01: qty 8
  Filled 2021-07-01 (×8): qty 2

## 2021-07-01 MED ORDER — SODIUM CHLORIDE 0.9 % IV BOLUS (SEPSIS)
1000.0000 mL | Freq: Once | INTRAVENOUS | Status: AC
  Administered 2021-07-01: 1000 mL via INTRAVENOUS

## 2021-07-01 MED ORDER — UMECLIDINIUM-VILANTEROL 62.5-25 MCG/ACT IN AEPB: 1.0000 | INHALATION_SPRAY | Freq: Every day | RESPIRATORY_TRACT | Status: DC

## 2021-07-01 MED ORDER — ACETAMINOPHEN 325 MG PO TABS
650.0000 mg | ORAL_TABLET | Freq: Four times a day (QID) | ORAL | Status: DC | PRN
  Administered 2021-07-04 – 2021-07-07 (×4): 650 mg via ORAL
  Filled 2021-07-01 (×4): qty 2

## 2021-07-01 MED ORDER — CEFEPIME HCL 2 G IJ SOLR
2.0000 g | INTRAMUSCULAR | Status: DC
Start: 2021-07-02 — End: 2021-07-02
  Administered 2021-07-02: 2 g via INTRAVENOUS
  Filled 2021-07-01: qty 2

## 2021-07-01 MED ORDER — BUDESONIDE 0.5 MG/2ML IN SUSP
0.5000 mg | Freq: Two times a day (BID) | RESPIRATORY_TRACT | Status: DC
  Administered 2021-07-01 – 2021-07-06 (×12): 0.5 mg via RESPIRATORY_TRACT
  Filled 2021-07-01 (×14): qty 2

## 2021-07-01 MED ORDER — SODIUM CHLORIDE 0.9% FLUSH
3.0000 mL | Freq: Two times a day (BID) | INTRAVENOUS | Status: DC
  Administered 2021-07-01 – 2021-07-07 (×8): 3 mL via INTRAVENOUS

## 2021-07-01 MED ORDER — ORAL CARE MOUTH RINSE
15.0000 mL | Freq: Two times a day (BID) | OROMUCOSAL | Status: DC
  Administered 2021-07-01 – 2021-07-06 (×9): 15 mL via OROMUCOSAL

## 2021-07-01 MED ORDER — ALBUTEROL SULFATE HFA 108 (90 BASE) MCG/ACT IN AERS: 2.0000 | INHALATION_SPRAY | RESPIRATORY_TRACT | Status: DC | PRN

## 2021-07-01 MED ORDER — AZELASTINE HCL 0.1 % NA SOLN: 1.0000 | Freq: Two times a day (BID) | NASAL | Status: DC

## 2021-07-01 MED ORDER — PREGABALIN 50 MG PO CAPS
50.0000 mg | ORAL_CAPSULE | Freq: Two times a day (BID) | ORAL | Status: DC
  Administered 2021-07-01 – 2021-07-03 (×5): 50 mg via ORAL
  Filled 2021-07-01 (×4): qty 1
  Filled 2021-07-01: qty 2

## 2021-07-01 MED ORDER — LACTATED RINGERS IV SOLN: INTRAVENOUS | Status: DC

## 2021-07-01 MED ORDER — SODIUM CHLORIDE 0.9 % IV SOLN
1000.0000 mL | INTRAVENOUS | Status: DC
  Administered 2021-07-01: 1000 mL via INTRAVENOUS

## 2021-07-01 MED ORDER — HEPARIN (PORCINE) 25000 UT/250ML-% IV SOLN
1200.0000 [IU]/h | INTRAVENOUS | Status: AC
  Administered 2021-07-01: 13:00:00 1000 [IU]/h via INTRAVENOUS
  Filled 2021-07-01 (×2): qty 250

## 2021-07-01 MED ORDER — SODIUM CHLORIDE 0.9 % IV SOLN
2.0000 g | Freq: Once | INTRAVENOUS | Status: AC
  Administered 2021-07-01: 2 g via INTRAVENOUS
  Filled 2021-07-01: qty 2

## 2021-07-01 MED ORDER — ONDANSETRON HCL 4 MG/2ML IJ SOLN: 4.0000 mg | Freq: Four times a day (QID) | INTRAMUSCULAR | Status: DC | PRN

## 2021-07-01 MED ORDER — DONEPEZIL HCL 10 MG PO TABS: 10.0000 mg | ORAL_TABLET | Freq: Every day | ORAL | Status: DC

## 2021-07-01 MED ORDER — LEVOTHYROXINE SODIUM 88 MCG PO TABS
88.0000 ug | ORAL_TABLET | Freq: Every day | ORAL | Status: DC
  Administered 2021-07-02 – 2021-07-07 (×6): 88 ug via ORAL
  Filled 2021-07-01 (×6): qty 1

## 2021-07-01 MED ORDER — ONDANSETRON HCL 4 MG PO TABS: 4.0000 mg | ORAL_TABLET | Freq: Four times a day (QID) | ORAL | Status: DC | PRN

## 2021-07-01 MED ORDER — MONTELUKAST SODIUM 10 MG PO TABS
10.0000 mg | ORAL_TABLET | Freq: Every day | ORAL | Status: DC
  Administered 2021-07-01 – 2021-07-06 (×6): 10 mg via ORAL
  Filled 2021-07-01 (×7): qty 1

## 2021-07-01 MED ORDER — PRAVASTATIN SODIUM 40 MG PO TABS
40.0000 mg | ORAL_TABLET | Freq: Every day | ORAL | Status: DC
  Administered 2021-07-01 – 2021-07-06 (×6): 40 mg via ORAL
  Filled 2021-07-01 (×6): qty 1

## 2021-07-01 MED ORDER — ACETAMINOPHEN 650 MG RE SUPP: 650.0000 mg | Freq: Four times a day (QID) | RECTAL | Status: DC | PRN

## 2021-07-01 MED ORDER — MORPHINE SULFATE (PF) 2 MG/ML IV SOLN
2.0000 mg | INTRAVENOUS | Status: DC | PRN
  Administered 2021-07-02 – 2021-07-03 (×3): 2 mg via INTRAVENOUS
  Filled 2021-07-01 (×4): qty 1

## 2021-07-01 MED ORDER — PANTOPRAZOLE SODIUM 40 MG PO TBEC
40.0000 mg | DELAYED_RELEASE_TABLET | Freq: Every day | ORAL | Status: DC
  Administered 2021-07-01 – 2021-07-07 (×7): 40 mg via ORAL
  Filled 2021-07-01 (×7): qty 1

## 2021-07-01 MED ORDER — IPRATROPIUM BROMIDE 0.03 % NA SOLN: 2.0000 | Freq: Two times a day (BID) | NASAL | Status: DC

## 2021-07-01 MED ORDER — ASPIRIN EC 81 MG PO TBEC
81.0000 mg | DELAYED_RELEASE_TABLET | Freq: Every day | ORAL | Status: DC
  Administered 2021-07-01 – 2021-07-07 (×7): 81 mg via ORAL
  Filled 2021-07-01 (×7): qty 1

## 2021-07-01 NOTE — Assessment & Plan Note (Signed)
-  Mild cognitive impairment ?-Hold Aricept due to prolonged QTc ?

## 2021-07-01 NOTE — Progress Notes (Signed)
ANTICOAGULATION CONSULT NOTE ? ?Pharmacy Consult for Heparin ?Indication: atrial fibrillation ? ?Allergies  ?Allergen Reactions  ? Ciprofloxacin Hives  ?  Ask patient  ? Prochlorperazine Edisylate Other (See Comments)  ?  Cant swallow ?Muscle cramping  ? Prednisone Other (See Comments)  ?  unknown  ? Reduced Iso-Alpha Acids Complex Other (See Comments)  ?  unknown  ? Statins Other (See Comments)  ?  Muscle weakness ?  ? Sulfa Antibiotics Swelling  ? Azithromycin Rash  ? Latex Rash  ? ? ?Patient Measurements: ?Height: 5\' 2"  (157.5 cm) ?Weight: 68.2 kg (150 lb 5.7 oz) ?IBW/kg (Calculated) : 50.1 ?Heparin Dosing Weight: 65 kg ? ?Vital Signs: ?Temp: 98.2 ?F (36.8 ?C) (03/11 2110) ?Temp Source: Oral (03/11 2110) ?BP: 122/52 (03/11 2110) ?Pulse Rate: 80 (03/11 2110) ? ?Labs: ?Recent Labs  ?  07/01/21 ?0335 07/01/21 ?1228 07/01/21 ?2035  ?HGB 11.3*  --   --   ?HCT 37.8  --   --   ?PLT 196  --   --   ?APTT 37*  --  70*  ?LABPROT 17.3*  --   --   ?INR 1.4*  --   --   ?HEPARINUNFRC  --  0.29* 0.19*  ?CREATININE 1.49*  --   --   ? ? ? ?Estimated Creatinine Clearance: 29.5 mL/min (A) (by C-G formula based on SCr of 1.49 mg/dL (H)). ? ? ?Medical History: ?Past Medical History:  ?Diagnosis Date  ? Anemia   ? Anxiety   ? Arthritis   ? Asthma   ? Atrial fibrillation ([place])   ? Blood transfusion without reported diagnosis   ? Cataract   ? CHF (congestive heart failure) ([place])   ? Chronic kidney disease   ? Clotting disorder ([place])   ? COPD (chronic obstructive pulmonary disease) ([place])   ? Depression   ? Diabetes mellitus without complication ([place])   ? GERD (gastroesophageal reflux disease)   ? Heart murmur   ? Hyperlipidemia   ? Myocardial infarction [facility])   ? Neuromuscular disorder ([place])   ? tremors  ? Seizures ([place])   ? Sleep apnea   ? Stroke [facility])   ? Thyroid disease   ? ? ?Medications:  ?Medications Prior to Admission  ?Medication Sig Dispense Refill Last Dose  ? aspirin 81 MG EC tablet Take 1 tablet (81 mg total) by mouth daily.  Swallow whole. 30 tablet 0 06/30/2021  ? budesonide (PULMICORT) 0.5 MG/2ML nebulizer solution Take 2 mLs (0.5 mg total) by nebulization in the morning and at bedtime. 120 mL 0 06/30/2021  ? donepezil (ARICEPT) 10 MG tablet Take 1 tablet (10 mg total) by mouth at bedtime. 30 tablet 0 06/30/2021  ? HYDROcodone-acetaminophen (NORCO/VICODIN) 5-325 MG tablet Take 1 tablet by mouth every 6 (six) hours as needed for moderate pain.   06/30/2021  ? levofloxacin (LEVAQUIN) 500 MG tablet Take 500 mg by mouth daily.   06/30/2021  ? levothyroxine (SYNTHROID) 88 MCG tablet Take 88 mcg by mouth daily before breakfast.   06/30/2021  ? montelukast (SINGULAIR) 10 MG tablet Take 1 tablet (10 mg total) by mouth at bedtime. 30 tablet 0 06/30/2021  ? Multiple Vitamin (MULTIVITAMIN WITH MINERALS) TABS tablet Take 1 tablet by mouth daily.   06/30/2021  ? omeprazole (PRILOSEC) 40 MG capsule Take 1 capsule (40 mg total) by mouth daily. 30 capsule 0 06/30/2021  ? Phenazopyridine HCl (URISTAT PO) Take 1 tablet by mouth as needed (UTI).   Past Week  ? pravastatin (PRAVACHOL) 40  MG tablet Take 1 tablet (40 mg total) by mouth daily at 6 PM. 30 tablet 0 06/30/2021  ? pregabalin (LYRICA) 50 MG capsule TAKE 1 CAPSULE BY MOUTH 2 TIMES DAILY. (Patient taking differently: Take 50 mg by mouth 2 (two) times daily.) 60 capsule 0 06/30/2021  ? Rivaroxaban (XARELTO) 15 MG TABS tablet Take 1 tablet (15 mg total) by mouth daily. 90 tablet 0 06/30/2021 at 0900  ? topiramate (TOPAMAX) 200 MG tablet Take 1 tablet (200 mg total) by mouth 2 (two) times daily. 60 tablet 0 06/30/2021  ? albuterol (PROVENTIL HFA) 108 (90 Base) MCG/ACT inhaler Inhale two puffs every 4-6 hours if needed for coughing, wheezing, or shortness of breath. (Patient not taking: Reported on 07/01/2021) 1 each 1 Not Taking  ? ANORO ELLIPTA 62.5-25 MCG/ACT AEPB TAKE 1 PUFF BY MOUTH EVERY DAY (Patient not taking: Reported on 07/01/2021) 60 each 1 Not Taking  ? azelastine (ASTELIN) 0.1 % nasal spray Use one  spray in each nostril twice daily (Patient not taking: Reported on 07/01/2021) 30 mL 5 Not Taking  ? butalbital-acetaminophen-caffeine (FIORICET) 50-325-40 MG tablet Take 1 tablet by mouth daily as needed for migraine. (Patient not taking: Reported on 07/01/2021) 5 tablet 0 Completed Course  ? famotidine (PEPCID) 20 MG tablet Take one tablet once daily (Patient not taking: Reported on 07/01/2021) 30 tablet 0 Not Taking  ? fluticasone (FLONASE) 50 MCG/ACT nasal spray SPRAY 1 SPRAY INTO BOTH NOSTRILS DAILY. (Patient not taking: Reported on 07/01/2021) 48 mL 2 Not Taking  ? ipratropium (ATROVENT) 0.03 % nasal spray SPRAY 2 SPRAYS INTO BOTH NOSTRILS EVERY 12 HOURS. (Patient not taking: Reported on 07/01/2021) 90 mL 3 Not Taking  ? lipase/protease/amylase (CREON) 12000-38000 units CPEP capsule Take 1 capsule (12,000 Units total) by mouth 3 (three) times daily before meals. (Patient not taking: Reported on 07/01/2021) 270 capsule 0 Not Taking  ? ? ?Scheduled:  ? aspirin EC  81 mg Oral Daily  ? budesonide  0.5 mg Nebulization BID  ? famotidine  20 mg Oral Daily  ? [START ON 07/02/2021] levothyroxine  88 mcg Oral QAC breakfast  ? mouth rinse  15 mL Mouth Rinse BID  ? montelukast  10 mg Oral QHS  ? pantoprazole  40 mg Oral Daily  ? pravastatin  40 mg Oral q1800  ? pregabalin  50 mg Oral BID  ? sodium chloride flush  3 mL Intravenous Q12H  ? topiramate  200 mg Oral BID  ? ?Infusions:  ? [START ON 07/02/2021] ceFEPime (MAXIPIME) IV    ? heparin 1,000 Units/hr (07/01/21 1323)  ? lactated ringers 100 mL/hr at 07/01/21 1320  ? ?PRN: acetaminophen **OR** acetaminophen, morphine injection, oxyCODONE ? ?Assessment: ?83 yof with a history of afib on Xarelto; HF; COPD; DM; HLD; OSA; CVA; dementia; hypothyroidism; and seizures. Admission 1/9-15 for AIS & septic shock from pyelo/L renal calculus s/p L ureteral stenting. Patient is presenting with fever. Heparin per pharmacy consult placed for atrial fibrillation. ? ?Patient is on xarelto prior  to arrival. Decision made to hold pending urology plan for stent. Last dose 3/10 0900.  ? ?Initial heparin level subtherapeutic, aPTT is within goal range - appears Xarelto is no longer significantly influencing anti-Xa levels so will dose off of this. ? ?Goal of Therapy:  ?Heparin level 0.3-0.7 units/ml ?aPTT 66-102 seconds ?Monitor platelets by anticoagulation protocol: Yes ?  ?Plan:  ?-Increase heparin to 1200 units/h ?-Recheck heparin level in 8h - will check aPTT as well to double check  correlation ? ?Arrie Senate, PharmD, BCPS, BCCP ?Clinical Pharmacist ?[phone] ?Please check AMION for all [facility] numbers ?07/01/2021 ? ?

## 2021-07-01 NOTE — Assessment & Plan Note (Signed)
-  On 2L [place] O2 ?-Continue Pulmicort, Singulair ?-Hold Anoro, Albuterol due to prolonged QTc ?

## 2021-07-01 NOTE — Assessment & Plan Note (Addendum)
-  SIRS criteria in this patient includes: Leukocytosis, tachycardia, tachypnea  -Patient has no current evidence of acute organ failure -Suspected source is UTI; she has a known nonobstructing stone with recent stent placement -1/9 E coli UTI, sensitive to Cefepime -Blood and urine cultures pending -Treat with IV Cefepime for presumed complicated urinary source x 7 days

## 2021-07-01 NOTE — H&P (Signed)
History and Physical    Patient: Jenny Kelly [id number] DOB: 08-Sep-1945 DOA: 07/01/2021 DOS: the patient was seen and examined on 07/01/2021 PCP: Townsend Roger, MD  Patient coming from: Home - lives with husband; NOK:  [place], [place], [phone]   Chief Complaint: vaginal pain, fever  HPI: Jenny Kelly is a 76 y.o. female with medical history significant of afib; chronic diastolic CHF (grade 2 on echo on 05/02/21); COPD; DM; HLD; OSA; CVA; dementia; hypothyroidism; and seizures presenting with fever.   She was seen at [facility] yesterday for UTI and discharged with PO antibiotics.  She was last admitted here from 1/9-15 (and then to [facility]) for acute ischemic stroke in conjunction with septic shock from pyelo/L renal calculus s/p L ureteral stenting.  She reports that she has had pain in her "punanny" (vagina) since the stent was placed.  She had worsening pain the past few days and so went to [facility] overnight yesterday.  She was not febrile and an evaluation showed UTI so she was given oral abx and pain meds and discharged.  The pain was intolerable overnight and she developed fever and so they came here.  Her pain is still severe in her vagina.  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  The patient and her husband prefer definitive treatment of the stone if possible during this hospitalization, since she is having such severe ongoing discomfort and now has recurrent sepsis.    ER Course:  Urosepsis last month, infected stone with ureteral stenting.  CVA during that episode, L-sided deficits.  Stone still present and now with sepsis, T102.   Urine culture pending.  Stent is working, so no plan for intervention.  Dr. Abner Greenspan is consulting.  Would need cardiology clearance were intervention needed.     Review of Systems: As mentioned in the history of present illness. All other systems reviewed and are negative. Past Medical History:   Diagnosis Date   Anemia    Anxiety    Arthritis    Asthma    Atrial fibrillation ([place])    Blood transfusion without reported diagnosis    Cataract    CHF (congestive heart failure) (HCC)    Chronic kidney disease    Clotting disorder (HCC)    COPD (chronic obstructive pulmonary disease) (HCC)    Depression    Diabetes mellitus without complication (HCC)    GERD (gastroesophageal reflux disease)    Heart murmur    Hyperlipidemia    Myocardial infarction ([place])    Neuromuscular disorder (HCC)    tremors   Seizures ([place])    Sleep apnea    Stroke ([place])    Thyroid disease    Past Surgical History:  Procedure Laterality Date   ABDOMINAL HYSTERECTOMY     APPENDECTOMY     bladder tack     CHOLECYSTECTOMY     CORONARY ARTERY BYPASS GRAFT     CYSTOSCOPY W/ URETERAL STENT PLACEMENT Left 05/01/2021   Procedure: CYSTOSCOPY WITH RETROGRADE PYELOGRAM/URETERAL STENT PLACEMENT;  Surgeon: Ceasar Mons, MD;  Location: [place];  Service: Urology;  Laterality: Left;   ESOPHAGOGASTRODUODENOSCOPY ENDOSCOPY     EYE SURGERY     FRACTURE SURGERY Right    3rd finger   JOINT REPLACEMENT Bilateral    knees   TONSILLECTOMY AND ADENOIDECTOMY     TUBAL LIGATION     Social History:  reports that she quit smoking about 21  years ago. Her smoking use included cigarettes. She has a 15.00 pack-year smoking history. She has never used smokeless tobacco. She reports that she does not currently use alcohol. She reports that she does not use drugs.  Allergies  Allergen Reactions   Ciprofloxacin Hives    Ask patient   Prochlorperazine Edisylate Other (See Comments)    Cant swallow Muscle cramping   Prednisone Other (See Comments)    unknown   Reduced Iso-Alpha Acids Complex Other (See Comments)    unknown   Statins Other (See Comments)    Muscle weakness    Sulfa Antibiotics Swelling   Azithromycin Rash   Latex Rash    Family History  Problem Relation Age of Onset   Heart disease  Mother    Heart attack Mother    Breast cancer Mother    Heart disease Sister     Prior to Admission medications   Medication Sig Start Date End Date Taking? Authorizing Provider  pregabalin (LYRICA) 50 MG capsule TAKE 1 CAPSULE BY MOUTH 2 TIMES DAILY. 06/30/21   Raulkar, Clide Deutscher, MD  albuterol (PROVENTIL HFA) 108 (90 Base) MCG/ACT inhaler Inhale two puffs every 4-6 hours if needed for coughing, wheezing, or shortness of breath. 12/21/20   Kozlow, Donnamarie Poag, MD  ANORO ELLIPTA 62.5-25 MCG/ACT AEPB TAKE 1 PUFF BY MOUTH EVERY DAY 06/09/21   Raulkar, Clide Deutscher, MD  aspirin 81 MG EC tablet Take 1 tablet (81 mg total) by mouth daily. Swallow whole. 05/16/21   Love, Ivan Anchors, PA-C  azelastine (ASTELIN) 0.1 % nasal spray Use one spray in each nostril twice daily 12/21/20   Kozlow, Donnamarie Poag, MD  budesonide (PULMICORT) 0.5 MG/2ML nebulizer solution Take 2 mLs (0.5 mg total) by nebulization in the morning and at bedtime. 05/16/21   Love, Ivan Anchors, PA-C  butalbital-acetaminophen-caffeine (FIORICET) [phone] MG tablet Take 1 tablet by mouth daily as needed for migraine. 05/16/21   Love, Ivan Anchors, PA-C  Cyanocobalamin (B-12 PO) Take by mouth.    [provider]  donepezil (ARICEPT) 10 MG tablet Take 1 tablet (10 mg total) by mouth at bedtime. 05/16/21   Love, Ivan Anchors, PA-C  famotidine (PEPCID) 20 MG tablet Take one tablet once daily 05/16/21   Love, Ivan Anchors, PA-C  fluticasone (FLONASE) 50 MCG/ACT nasal spray SPRAY 1 SPRAY INTO BOTH NOSTRILS DAILY. 08/02/20   Freddi Starr, MD  ipratropium (ATROVENT) 0.03 % nasal spray SPRAY 2 SPRAYS INTO BOTH NOSTRILS EVERY 12 HOURS. 04/10/21   Freddi Starr, MD  levothyroxine (SYNTHROID) 88 MCG tablet Take 88 mcg by mouth daily before breakfast.    [provider]  lipase/protease/amylase (CREON) 12000-38000 units CPEP capsule Take 1 capsule (12,000 Units total) by mouth 3 (three) times daily before meals. 05/16/21   Love, Ivan Anchors, PA-C  montelukast  (SINGULAIR) 10 MG tablet Take 1 tablet (10 mg total) by mouth at bedtime. 05/16/21   Love, Ivan Anchors, PA-C  Multiple Vitamin (MULTIVITAMIN WITH MINERALS) TABS tablet Take 1 tablet by mouth daily. 05/17/21   Love, Ivan Anchors, PA-C  omeprazole (PRILOSEC) 40 MG capsule Take 1 capsule (40 mg total) by mouth daily. 05/16/21   Love, Ivan Anchors, PA-C  Phenazopyridine HCl (URISTAT PO) Take 1 tablet by mouth as needed (UTI).    [provider]  pravastatin (PRAVACHOL) 40 MG tablet Take 1 tablet (40 mg total) by mouth daily at 6 PM. 05/16/21   Love, Ivan Anchors, PA-C  Rivaroxaban (XARELTO) 15 MG TABS tablet  Take 1 tablet (15 mg total) by mouth daily. 06/14/21   Park Liter, MD  topiramate (TOPAMAX) 200 MG tablet Take 1 tablet (200 mg total) by mouth 2 (two) times daily. 05/16/21   Bary Leriche, PA-C    Physical Exam: Vitals:   07/01/21 1445 07/01/21 1500 07/01/21 1506 07/01/21 1530  BP:  129/60  (!) 124/59  Pulse:  89  90  Resp:  20  20  Temp:   99.8 F (37.7 C)   TempSrc:   Oral   SpO2: 97% 94%  92%   General:  Appears calm and comfortable and is in NAD Eyes:  PERRL, EOMI, normal lids, iris ENT:  grossly normal hearing, lips & tongue, mmm; poor dentition Neck:  no LAD, masses or thyromegaly Cardiovascular:  RRR, no m/r/g. No LE edema.  Respiratory:   CTA bilaterally with no wheezes/rales/rhonchi.  Normal respiratory effort. Abdomen:  soft, NT, ND Back:   mild R CVAT Skin:  no rash or induration seen on limited exam Musculoskeletal:  grossly normal tone BUE/BLE, good ROM, no bony abnormality Psychiatric:  blunted mood and affect, speech fluent and appropriate, AOx2-3 Neurologic:  CN 2-12 grossly intact, LUE hemiparesis   Radiological Exams on Admission: Independently reviewed - see discussion in A/P where applicable  DG Chest Port 1 View  Result Date: 07/01/2021 CLINICAL DATA:  Questionable sepsis. EXAM: PORTABLE CHEST 1 VIEW COMPARISON:  05/14/2021 FINDINGS: Postoperative changes  in the mediastinum. Heart size and pulmonary vascularity are normal. Diffuse interstitial changes throughout the lungs, similar to prior study. This is likely chronic fibrosis. No focal consolidation. No pleural effusions. No pneumothorax. Mediastinal contours appear intact. Calcification of the aorta. Degenerative changes in the spine and shoulders. Surgical clips in the right upper quadrant. IMPRESSION: Chronic interstitial pattern to the lungs without change. No evidence of active pulmonary disease. Electronically Signed   By: Lucienne Capers M.D.   On: 07/01/2021 04:02   CT Renal Stone Study  Result Date: 07/01/2021 CLINICAL DATA:  76 year old female with history of urinary tract infection. Possible nephrolithiasis. EXAM: CT ABDOMEN AND PELVIS WITHOUT CONTRAST TECHNIQUE: Multidetector CT imaging of the abdomen and pelvis was performed following the standard protocol without IV contrast. RADIATION DOSE REDUCTION: This exam was performed according to the departmental dose-optimization program which includes automated exposure control, adjustment of the mA and/or kV according to patient size and/or use of iterative reconstruction technique. COMPARISON:  CT of the abdomen and pelvis 06/01/2021. FINDINGS: Lower chest: Extensive areas of cylindrical bronchiectasis, thickening of the peribronchovascular interstitium and regional architectural distortion and volume loss are again noted in the lung bases, most severe in the lower lobes, likely progressive post infectious or inflammatory scarring, although the possibility of active infection or sequela of recent aspiration is not excluded. Atherosclerotic calcifications in the thoracic aorta as well as the left main, left anterior descending, left circumflex and right coronary arteries. Hepatobiliary: No definite suspicious cystic or solid hepatic lesions are confidently identified on today's noncontrast CT examination. A few scattered tiny calcified granulomas are  noted throughout the liver. Status post cholecystectomy. Pancreas: Numerous coarse calcifications are noted throughout the pancreas, along with diffuse pancreatic atrophy, which is likely sequela of chronic pancreatitis. No discrete pancreatic mass confidently identified on today's noncontrast CT examination. No peripancreatic fluid collections or inflammatory changes. Spleen: Numerous calcified granulomas throughout the spleen. Small splenule medial to the spleen. Adrenals/Urinary Tract: In the lower pole collecting system of the left kidney there is a 9 mm nonobstructive  calculus. Left-sided double-J ureteral stent appears appropriately located with the proximal loop reformed in the left renal pelvis and distal loop reformed in the urinary bladder. No additional calculi are confidently identified within the right renal collecting system, along the course of either ureter, or within the lumen of the urinary bladder. There is mild fullness in the left renal pelvis, but no frank hydroureteronephrosis. Unenhanced appearance of the right kidney, bilateral adrenal glands and urinary bladder is unremarkable. Stomach/Bowel: Unenhanced appearance of the stomach is normal. There is no pathologic dilatation of small bowel or colon. The appendix is not confidently identified and may be surgically absent. Regardless, there are no inflammatory changes noted adjacent to the cecum to suggest the presence of an acute appendicitis at this time. Vascular/Lymphatic: Aortic atherosclerosis. 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 No lymphadenopathy noted in the abdomen or pelvis. Reproductive: Status post hysterectomy. Ovaries are not confidently identified may be surgically absent or atrophic. Other: No significant volume of ascites.  No pneumoperitoneum. Musculoskeletal: There are no aggressive appearing lytic or blastic lesions noted in the visualized portions of the  skeleton. IMPRESSION: 1. 9 mm nonobstructive calculus in the lower pole collecting system of the left kidney. Left-sided double-J ureteral stent remains appropriately located. There is some very mild fullness in the left renal pelvis, without frank left hydroureteronephrosis to suggest substantial stent malfunction. 2. The appearance of the lung bases suggests substantial chronic post infectious or inflammatory scarring, although active multilobar bronchopneumonia or aspiration pneumonitis is not excluded. Further clinical evaluation is recommended. 3. Aortic atherosclerosis, in addition to left main and three-vessel coronary artery disease. 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 4. Additional incidental findings, as above. Electronically Signed   By: Vinnie Langton M.D.   On: 07/01/2021 05:20    EKG: Independently reviewed. NSR with rate 95; prolonged QTc 612; low voltage with nonspecific ST changes   Labs on Admission: I have personally reviewed the available labs and imaging studies at the time of the admission.  Pertinent labs:    Glucose 139 BUN 22/Creatinine 1.49/GFR 36 - stable Albumin 2.5 Lactate 1.1 WBC 13.6 Hgb 11.3 INR 1.4 COVID/flu negative UA: large Hgb, large LE, + nitrite, few bacteria, >50 RBC Blood and urine cultures pending    Assessment and Plan: * Sepsis secondary to UTI ([place]) -SIRS criteria in this patient includes: Leukocytosis, tachycardia, tachypnea  -Patient has no current evidence of acute organ failure -While awaiting blood cultures, this may be a preseptic condition. -Sepsis protocol initiated -Suspected source is UTI; she has a known nonobstructing stone with recent stent placement -1/9 E coli UTI, sensitive to Cefepime -Blood and urine cultures pending -Will admit due to: complicated UTI -Treat with IV Cefepime for presumed complicated urinary source -Will  order procalcitonin level.  Antibiotics would not be indicated for PCT <0.1 and probably should not be used for < 0.25.  >0.5 indicates infection and >>0.5 indicates more serious disease.  As the procalcitonin level normalizes, it will be reasonable to consider de-escalation of antibiotic coverage.  S/P ureteral stent placement -Ureteral stent during last hospitalization -At that time, she had pyelonephritis and had septic shock requiring pressors in the ICU -She reports persistent pain in her "punanny" (vagina) since stent placement -She is hoping that the stent/stone can be extracted during or shortly after this hospitalization -Urology is consulting  DNR (do  not resuscitate) -I have discussed code status with the patient and her husband and they are in agreement that the patient would not desire resuscitation and would prefer to die a natural death should that situation arise. -She will need a gold out of facility DNR form at the time of discharge  Prolonged QT interval -Will attempt to avoid QT-prolonging medications such as PPI, nausea meds, SSRIs -Repeat EKG in AM  COPD (chronic obstructive pulmonary disease) ([place]) -On 2L [place] O2 -Continue Pulmicort, Singulair -Hold Anoro, Albuterol due to prolonged QTc  Seizures (HCC) -Continue Topamax, Lyrica  Sleep apnea -Continue CPAP  Pulmonary fibrosis (HCC) -Noted on imaging today -On 2L [place] O2 -Continue Pulmicort, Singulair -Hold Anoro, Albuterol due to prolonged QTc  Right middle cerebral artery stroke ([place]) -Acute CVA during last hospitalization -She has residual primarily LUE hemiparesis -Continue ASA  Memory deficit -Mild cognitive impairment -Hold Aricept due to prolonged QTc  PAF (paroxysmal atrial fibrillation) ([place]) -She does not appear to be taking rate-controlling medications -Hold Xarelto given possible need for intervention; will use heparin drip for now  Coronary artery disease involving native coronary artery of  native heart -s/p stent -Continue ASA  Hyperlipidemia -Continue Pravachol  Hypothyroidism -Continue Synthroid  CKD (chronic kidney disease), stage III (HCC) -Stage 3b CKD -Appears to be stable for now -Recheck BMP in AM    Advance Care Planning:   Code Status: DNR   Consults: Urology  DVT Prophylaxis: Heparin drip  Family Communication: Husband was present throughout evaluation  Severity of Illness: The appropriate patient status for this patient is INPATIENT. Inpatient status is judged to be reasonable and necessary in order to provide the required intensity of service to ensure the patient's safety. The patient's presenting symptoms, physical exam findings, and initial radiographic and laboratory data in the context of their chronic comorbidities is felt to place them at high risk for further clinical deterioration. Furthermore, it is not anticipated that the patient will be medically stable for discharge from the hospital within 2 midnights of admission.   * I certify that at the point of admission it is my clinical judgment that the patient will require inpatient hospital care spanning beyond 2 midnights from the point of admission due to high intensity of service, high risk for further deterioration and high frequency of surveillance required.*  Author: Karmen Bongo, MD 07/01/2021 4:10 PM  For on call review [email or web address].

## 2021-07-01 NOTE — Assessment & Plan Note (Addendum)
-  Ureteral stent during last hospitalization -At that time, she had pyelonephritis and had septic shock requiring pressors in the ICU -She reports persistent pain in her vagina since stent placement -Urology consulted -pain: valium did not help per patient -will try ditropan- husband thinks helped

## 2021-07-01 NOTE — Assessment & Plan Note (Signed)
Continue Synthroid °

## 2021-07-01 NOTE — Assessment & Plan Note (Addendum)
-  Continue Topamax -resume lyrica (for headaches)

## 2021-07-01 NOTE — Progress Notes (Signed)
ANTICOAGULATION CONSULT NOTE - Initial Consult ? ?Pharmacy Consult for Heparin ?Indication: atrial fibrillation ? ?Allergies  ?Allergen Reactions  ? Ciprofloxacin Hives  ?  Ask patient  ? Prochlorperazine Edisylate Other (See Comments)  ?  Cant swallow ?Muscle cramping  ? Prednisone Other (See Comments)  ?  unknown  ? Reduced Iso-Alpha Acids Complex Other (See Comments)  ?  unknown  ? Statins Other (See Comments)  ?  Muscle weakness ?  ? Sulfa Antibiotics Swelling  ? Azithromycin Rash  ? Latex Rash  ? ? ?Patient Measurements: ?  ?Heparin Dosing Weight: 65 kg ? ?Vital Signs: ?Temp: 100.1 ?F (37.8 ?C) (03/11 9702) ?Temp Source: Temporal (03/11 6378) ?BP: 106/50 (03/11 1000) ?Pulse Rate: 88 (03/11 1000) ? ?Labs: ?Recent Labs  ?  07/01/21 ?0335  ?HGB 11.3*  ?HCT 37.8  ?PLT 196  ?APTT 37*  ?LABPROT 17.3*  ?INR 1.4*  ?CREATININE 1.49*  ? ? ?Estimated Creatinine Clearance: 29 mL/min (A) (by C-G formula based on SCr of 1.49 mg/dL (H)). ? ? ?Medical History: ?Past Medical History:  ?Diagnosis Date  ? Anemia   ? Anxiety   ? Arthritis   ? Asthma   ? Atrial fibrillation (HCC)   ? Blood transfusion without reported diagnosis   ? Cataract   ? CHF (congestive heart failure) (HCC)   ? Chronic kidney disease   ? Clotting disorder (HCC)   ? COPD (chronic obstructive pulmonary disease) (HCC)   ? Depression   ? Diabetes mellitus without complication (HCC)   ? GERD (gastroesophageal reflux disease)   ? Heart murmur   ? Hyperlipidemia   ? Myocardial infarction [facility])   ? Neuromuscular disorder (HCC)   ? tremors  ? Seizures (HCC)   ? Sleep apnea   ? Stroke [facility])   ? Thyroid disease   ? ? ?Medications:  ?(Not in a hospital admission) ? ?Scheduled:  ? aspirin EC  81 mg Oral Daily  ? budesonide  0.5 mg Nebulization BID  ? famotidine  20 mg Oral Daily  ? [START ON 07/02/2021] levothyroxine  88 mcg Oral QAC breakfast  ? montelukast  10 mg Oral QHS  ? pantoprazole  40 mg Oral Daily  ? pravastatin  40 mg Oral q1800  ? pregabalin  50 mg Oral BID  ?  sodium chloride flush  3 mL Intravenous Q12H  ? topiramate  200 mg Oral BID  ? ?Infusions:  ? [START ON 07/02/2021] ceFEPime (MAXIPIME) IV    ? lactated ringers    ? ?PRN: acetaminophen **OR** acetaminophen, morphine injection, oxyCODONE ? ?Assessment: ?41 yof with a history of afib on Xarelto; HF; COPD; DM; HLD; OSA; CVA; dementia; hypothyroidism; and seizures. Admission 1/9-15 for AIS & septic shock from pyelo/L renal calculus s/p L ureteral stenting. Patient is presenting with fever. Heparin per pharmacy consult placed for atrial fibrillation. ? ?Patient is on xarelto prior to arrival. Decision made to hold pending urology plan for stent. Last dose 3/10 0900. Will require aPTT monitoring due to likely falsely high anti-Xa level secondary to DOAC use. ? ?Hgb 11.3; plt 196 ? ?Goal of Therapy:  ?Heparin level 0.3-0.7 units/ml ?aPTT 66-102 seconds ?Monitor platelets by anticoagulation protocol: Yes ?  ?Plan:  ?No initial heparin bolus ?Start heparin infusion at 1000 units/hr ?Check aPTT & anti-Xa level in 8 hours and daily while on heparin ?Continue to monitor via aPTT until levels are correlated ?Continue to monitor H&H and platelets ? ?Delmar Landau, PharmD, BCPS ?07/01/2021 10:41 AM ?ED Clinical Pharmacist -  [phone] ? ?  ?

## 2021-07-01 NOTE — Assessment & Plan Note (Addendum)
-  Stage 3b CKD ?-Appears to be stable for now ? ?

## 2021-07-01 NOTE — Assessment & Plan Note (Addendum)
-  Will attempt to avoid QT-prolonging medications such as PPI, nausea meds, SSRIs ? ?

## 2021-07-01 NOTE — Progress Notes (Signed)
RT NOTE: ? ?Pt refuses CPAP. Pt states she does not wear at home. Education was provided. Pt continued to refuse.  ?

## 2021-07-01 NOTE — Assessment & Plan Note (Addendum)
-  She does not appear to be taking rate-controlling medications ?-resume xarelto as no procedure is planned at this point ?

## 2021-07-01 NOTE — Assessment & Plan Note (Addendum)
-  admitting MD discussed code status with the patient and her husband and they are in agreement that the patient would not desire resuscitation and would prefer to die a natural death should that situation arise. ? ?

## 2021-07-01 NOTE — Assessment & Plan Note (Addendum)
-  Acute CVA during last hospitalization -She has residual primarily LUE hemiparesis -Continue ASA -PT/OT -? Symptom recrudesce check head CT

## 2021-07-01 NOTE — Progress Notes (Signed)
Pharmacy Antibiotic Note ? ?TKEYA STENCIL is a 76 y.o. female admitted on 07/01/2021 with UTI.  Pharmacy has been consulted for Cefepime dosing. WBC elevated. Noted renal dysfunction.  ? ?Plan: ?Cefepime 2g IV q24h ?Trend WBC, temp, renal function  ?F/U infectious work-up ? ? ? ?Temp (24hrs), Avg:100.1 ?F (37.8 ?C), Min:100.1 ?F (37.8 ?C), Max:100.1 ?F (37.8 ?C) ? ?Recent Labs  ?Lab 07/01/21 ?4098 07/01/21 ?1191  ?WBC 13.6*  --   ?CREATININE 1.49*  --   ?LATICACIDVEN  --  1.1  ?  ?Estimated Creatinine Clearance: 29 mL/min (A) (by C-G formula based on SCr of 1.49 mg/dL (H)).   ? ?Allergies  ?Allergen Reactions  ? Ciprofloxacin Hives  ?  Ask patient  ? Prochlorperazine Edisylate Other (See Comments)  ?  Cant swallow ?Muscle cramping  ? Prednisone   ? Reduced Iso-Alpha Acids Complex   ? Statins Other (See Comments)  ?  Muscle weakness ?  ? Sulfa Antibiotics Swelling  ? Azithromycin Rash  ? Latex Rash  ? ?Abran Duke, PharmD, BCPS ?Clinical Pharmacist ?Phone: [phone] ? ? ?

## 2021-07-01 NOTE — ED Provider Notes (Addendum)
[facility] EMERGENCY DEPARTMENT Provider Note  CSN: [id number] Arrival date & time: 07/01/21 [id number]  Chief Complaint(s) Abdominal Pain  HPI Jenny Kelly is a 76 y.o. female with a past medical history listed below including A-fib on Eliquis, hypertension, hyperlipidemia, diabetes, diastolic heart failure, recent stroke.   She presents for fever of 102.  Has known urinary tract infection related to an infected stone that required ureteral stent placement last month for urosepsis.  Patient's urinary tract infection had cleared but began having discomfort vulvar discomfort several days ago.  She went to [facility] yesterday and was noted to have a urinary tract infection.  She was started on moxifloxacin yesterday.  The history is provided by the patient.   Past Medical History Past Medical History:  Diagnosis Date   Anemia    Anxiety    Arthritis    Asthma    Atrial fibrillation ([place])    Blood transfusion without reported diagnosis    Cataract    CHF (congestive heart failure) ([place])    Chronic kidney disease    Clotting disorder ([place])    COPD (chronic obstructive pulmonary disease) ([place])    Depression    Diabetes mellitus without complication (HCC)    GERD (gastroesophageal reflux disease)    Heart murmur    Hyperlipidemia    Myocardial infarction ([place])    Neuromuscular disorder ([place])    tremors   Seizures ([place])    Sleep apnea    Stroke [facility])    Thyroid disease    Patient Active Problem List   Diagnosis Date Noted   Sepsis secondary to UTI ([place]) 07/01/2021   Sleep apnea 06/09/2021   Seizures ([place]) 06/09/2021   Neuromuscular disorder ([place]) 06/09/2021   Myocardial infarction ([place]) 06/09/2021   Heart murmur 06/09/2021   GERD (gastroesophageal reflux disease) 06/09/2021   Depression 06/09/2021   COPD (chronic obstructive pulmonary disease) ([place]) 06/09/2021   Clotting disorder ([place]) 06/09/2021   CHF (congestive  heart failure) ([place]) 06/09/2021   Cataract 06/09/2021   Blood transfusion without reported diagnosis 06/09/2021   Asthma 06/09/2021   Arthritis 06/09/2021   Anxiety 06/09/2021   Anemia 06/09/2021   Allergy 06/09/2021   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/16/2021   E coli bacteremia 05/08/2021   Pulmonary fibrosis ([place]) 05/08/2021   S/P ureteral stent placement 05/08/2021   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) 05/07/2021   Bradycardia    Wide-complex tachycardia    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   Sepsis with acute hypoxic respiratory failure without septic shock (HCC)    Hyperkalemia    AKI (acute kidney injury) ([place])    Polypharmacy 12/26/2017   Post-concussion headache 12/26/2017   Tremor 02/21/2016   Memory deficit 01/10/2016   Risk for falls 11/22/2015   CKD (chronic kidney disease), stage III ([place]) 06/21/2015   Hypothyroidism 06/21/2015   Hyperlipidemia 06/21/2015   PAF (paroxysmal atrial fibrillation) ([place]) 06/21/2015   Coronary artery disease involving native coronary artery of native heart 03/10/2014   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 Authorizing Provider  pregabalin (LYRICA) 50 MG capsule TAKE 1 CAPSULE BY MOUTH 2 TIMES DAILY. 06/30/21   Raulkar, Clide Deutscher, MD  albuterol (PROVENTIL HFA) 108 (90 Base) MCG/ACT inhaler Inhale two puffs every 4-6 hours if needed for coughing, wheezing, or shortness of breath. 12/21/20   Kozlow, Donnamarie Poag,  MD  ANORO ELLIPTA 62.5-25 MCG/ACT AEPB TAKE 1 PUFF BY MOUTH EVERY DAY 06/09/21   Raulkar, Clide Deutscher, MD  aspirin 81 MG EC tablet Take 1 tablet (81 mg total) by mouth daily. Swallow whole. 05/16/21   Love, Ivan Anchors, PA-C  azelastine (ASTELIN) 0.1 % nasal spray Use one spray in each nostril twice daily 12/21/20   Kozlow, Donnamarie Poag, MD  budesonide (PULMICORT)  0.5 MG/2ML nebulizer solution Take 2 mLs (0.5 mg total) by nebulization in the morning and at bedtime. 05/16/21   Love, Ivan Anchors, PA-C  butalbital-acetaminophen-caffeine (FIORICET) [phone] MG tablet Take 1 tablet by mouth daily as needed for migraine. 05/16/21   Love, Ivan Anchors, PA-C  Cyanocobalamin (B-12 PO) Take by mouth.    [provider]  donepezil (ARICEPT) 10 MG tablet Take 1 tablet (10 mg total) by mouth at bedtime. 05/16/21   Love, Ivan Anchors, PA-C  famotidine (PEPCID) 20 MG tablet Take one tablet once daily 05/16/21   Love, Ivan Anchors, PA-C  fluticasone (FLONASE) 50 MCG/ACT nasal spray SPRAY 1 SPRAY INTO BOTH NOSTRILS DAILY. 08/02/20   Freddi Starr, MD  ipratropium (ATROVENT) 0.03 % nasal spray SPRAY 2 SPRAYS INTO BOTH NOSTRILS EVERY 12 HOURS. 04/10/21   Freddi Starr, MD  levothyroxine (SYNTHROID) 88 MCG tablet Take 88 mcg by mouth daily before breakfast.    [provider]  lipase/protease/amylase (CREON) 12000-38000 units CPEP capsule Take 1 capsule (12,000 Units total) by mouth 3 (three) times daily before meals. 05/16/21   Love, Ivan Anchors, PA-C  montelukast (SINGULAIR) 10 MG tablet Take 1 tablet (10 mg total) by mouth at bedtime. 05/16/21   Love, Ivan Anchors, PA-C  Multiple Vitamin (MULTIVITAMIN WITH MINERALS) TABS tablet Take 1 tablet by mouth daily. 05/17/21   Love, Ivan Anchors, PA-C  omeprazole (PRILOSEC) 40 MG capsule Take 1 capsule (40 mg total) by mouth daily. 05/16/21   Love, Ivan Anchors, PA-C  Phenazopyridine HCl (URISTAT PO) Take 1 tablet by mouth as needed (UTI).    [provider]  pravastatin (PRAVACHOL) 40 MG tablet Take 1 tablet (40 mg total) by mouth daily at 6 PM. 05/16/21   Love, Ivan Anchors, PA-C  Rivaroxaban (XARELTO) 15 MG TABS tablet Take 1 tablet (15 mg total) by mouth daily. 06/14/21   Park Liter, MD  topiramate (TOPAMAX) 200 MG tablet Take 1 tablet (200 mg total) by mouth 2 (two) times daily. 05/16/21   Love, Ivan Anchors, PA-C                                                                                                                                     Allergies Ciprofloxacin, Prochlorperazine edisylate, Prednisone, Reduced iso-alpha acids complex, Statins, Sulfa antibiotics, Azithromycin, and Latex  Review of Systems Review of Systems As noted in HPI  Physical Exam Vital Signs  I have reviewed the triage vital signs BP (!) 121/52  Pulse 88    Temp 100.1 F (37.8 C) (Temporal)    Resp (!) 22    SpO2 99%   Physical Exam Vitals reviewed. Exam conducted with a chaperone present.  Constitutional:      General: She is not in acute distress.    Appearance: She is well-developed. She is not diaphoretic.  HENT:     Head: Normocephalic and atraumatic.     Nose: Nose normal.  Eyes:     General: No scleral icterus.       Right eye: No discharge.        Left eye: No discharge.     Conjunctiva/sclera: Conjunctivae normal.     Pupils: Pupils are equal, round, and reactive to light.  Cardiovascular:     Rate and Rhythm: Normal rate and regular rhythm.     Heart sounds: No murmur heard.   No friction rub. No gallop.  Pulmonary:     Effort: Pulmonary effort is normal. No respiratory distress.     Breath sounds: Normal breath sounds. No stridor. No rales.  Abdominal:     General: There is no distension.     Palpations: Abdomen is soft.     Tenderness: There is no abdominal tenderness.  Genitourinary:    Pubic Area: No rash.      Labia:        Right: No tenderness or lesion.        Left: No tenderness or lesion.   Musculoskeletal:        General: No tenderness.     Cervical back: Normal range of motion and neck supple.  Lymphadenopathy:     Lower Body: No right inguinal adenopathy. No left inguinal adenopathy.  Skin:    General: Skin is warm and dry.     Findings: No erythema or rash.  Neurological:     Mental Status: She is alert and oriented to person, place, and time.    ED Results and Treatments Labs (all labs  ordered are listed, but only abnormal results are displayed) Labs Reviewed  COMPREHENSIVE METABOLIC PANEL - Abnormal; Notable for the following components:      Result Value   Chloride 115 (*)    CO2 21 (*)    Glucose, Bld 139 (*)    Creatinine, Ser 1.49 (*)    Calcium 8.3 (*)    Albumin 2.5 (*)    AST 14 (*)    Total Bilirubin 0.1 (*)    GFR, Estimated 36 (*)    All other components within normal limits  CBC - Abnormal; Notable for the following components:   WBC 13.6 (*)    Hemoglobin 11.3 (*)    MCHC 29.9 (*)    RDW 16.5 (*)    All other components within normal limits  URINALYSIS, ROUTINE W REFLEX MICROSCOPIC - Abnormal; Notable for the following components:   APPearance HAZY (*)    Hgb urine dipstick LARGE (*)    Nitrite POSITIVE (*)    Leukocytes,Ua LARGE (*)    RBC / HPF >50 (*)    Bacteria, UA FEW (*)    All other components within normal limits  PROTIME-INR - Abnormal; Notable for the following components:   Prothrombin Time 17.3 (*)    INR 1.4 (*)    All other components within normal limits  APTT - Abnormal; Notable for the following components:   aPTT 37 (*)    All other components within normal limits  RESP PANEL BY RT-PCR (FLU  A&B, COVID) ARPGX2  CULTURE, BLOOD (ROUTINE X 2)  CULTURE, BLOOD (ROUTINE X 2)  URINE CULTURE  LACTIC ACID, PLASMA                                                                                                                         EKG  EKG Interpretation  Date/Time:  Saturday July 01 2021 04:48:13 EST Ventricular Rate:  95 PR Interval:    QRS Duration: 79 QT Interval:  486 QTC Calculation: 612 R Axis:   64 Text Interpretation: Sinus rhythm Low voltage, extremity and precordial leads Prolonged QT interval Confirmed by Addison Lank [phone]) on 07/01/2021 5:12:06 AM       Radiology DG Chest Port 1 View  Result Date: 07/01/2021 CLINICAL DATA:  Questionable sepsis. EXAM: PORTABLE CHEST 1 VIEW COMPARISON:  05/14/2021  FINDINGS: Postoperative changes in the mediastinum. Heart size and pulmonary vascularity are normal. Diffuse interstitial changes throughout the lungs, similar to prior study. This is likely chronic fibrosis. No focal consolidation. No pleural effusions. No pneumothorax. Mediastinal contours appear intact. Calcification of the aorta. Degenerative changes in the spine and shoulders. Surgical clips in the right upper quadrant. IMPRESSION: Chronic interstitial pattern to the lungs without change. No evidence of active pulmonary disease. Electronically Signed   By: Lucienne Capers M.D.   On: 07/01/2021 04:02   CT Renal Stone Study  Result Date: 07/01/2021 CLINICAL DATA:  76 year old female with history of urinary tract infection. Possible nephrolithiasis. EXAM: CT ABDOMEN AND PELVIS WITHOUT CONTRAST TECHNIQUE: Multidetector CT imaging of the abdomen and pelvis was performed following the standard protocol without IV contrast. RADIATION DOSE REDUCTION: This exam was performed according to the departmental dose-optimization program which includes automated exposure control, adjustment of the mA and/or kV according to patient size and/or use of iterative reconstruction technique. COMPARISON:  CT of the abdomen and pelvis 06/01/2021. FINDINGS: Lower chest: Extensive areas of cylindrical bronchiectasis, thickening of the peribronchovascular interstitium and regional architectural distortion and volume loss are again noted in the lung bases, most severe in the lower lobes, likely progressive post infectious or inflammatory scarring, although the possibility of active infection or sequela of recent aspiration is not excluded. Atherosclerotic calcifications in the thoracic aorta as well as the left main, left anterior descending, left circumflex and right coronary arteries. Hepatobiliary: No definite suspicious cystic or solid hepatic lesions are confidently identified on today's noncontrast CT examination. A few scattered  tiny calcified granulomas are noted throughout the liver. Status post cholecystectomy. Pancreas: Numerous coarse calcifications are noted throughout the pancreas, along with diffuse pancreatic atrophy, which is likely sequela of chronic pancreatitis. No discrete pancreatic mass confidently identified on today's noncontrast CT examination. No peripancreatic fluid collections or inflammatory changes. Spleen: Numerous calcified granulomas throughout the spleen. Small splenule medial to the spleen. Adrenals/Urinary Tract: In the lower pole collecting system of the left kidney there is a 9 mm nonobstructive calculus. Left-sided double-J ureteral stent appears appropriately located with the proximal loop reformed in  the left renal pelvis and distal loop reformed in the urinary bladder. No additional calculi are confidently identified within the right renal collecting system, along the course of either ureter, or within the lumen of the urinary bladder. There is mild fullness in the left renal pelvis, but no frank hydroureteronephrosis. Unenhanced appearance of the right kidney, bilateral adrenal glands and urinary bladder is unremarkable. Stomach/Bowel: Unenhanced appearance of the stomach is normal. There is no pathologic dilatation of small bowel or colon. The appendix is not confidently identified and may be surgically absent. Regardless, there are no inflammatory changes noted adjacent to the cecum to suggest the presence of an acute appendicitis at this time. Vascular/Lymphatic: Aortic atherosclerosis. 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 No lymphadenopathy noted in the abdomen or pelvis. Reproductive: Status post hysterectomy. Ovaries are not confidently identified may be surgically absent or atrophic. Other: No significant volume of ascites.  No pneumoperitoneum. Musculoskeletal: There are no aggressive appearing lytic or blastic lesions noted in the  visualized portions of the skeleton. IMPRESSION: 1. 9 mm nonobstructive calculus in the lower pole collecting system of the left kidney. Left-sided double-J ureteral stent remains appropriately located. There is some very mild fullness in the left renal pelvis, without frank left hydroureteronephrosis to suggest substantial stent malfunction. 2. The appearance of the lung bases suggests substantial chronic post infectious or inflammatory scarring, although active multilobar bronchopneumonia or aspiration pneumonitis is not excluded. Further clinical evaluation is recommended. 3. Aortic atherosclerosis, in addition to left main and three-vessel coronary artery disease. 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 4. Additional incidental findings, as above. Electronically Signed   By: Vinnie Langton M.D.   On: 07/01/2021 05:20    Pertinent labs & imaging results that were available during my care of the patient were reviewed by me and considered in my medical decision making (see MDM for details).  Medications Ordered in ED Medications  sodium chloride 0.9 % bolus 1,000 mL (0 mLs Intravenous Stopped 07/01/21 0637)    Followed by  0.9 %  sodium chloride infusion (1,000 mLs Intravenous New Bag/Given 07/01/21 0418)  ceFEPIme (MAXIPIME) 2 g in sodium chloride 0.9 % 100 mL IVPB (has no administration in time range)  fentaNYL (SUBLIMAZE) injection 50 mcg (50 mcg Intravenous Given 07/01/21 0419)  ceFEPIme (MAXIPIME) 2 g in sodium chloride 0.9 % 100 mL IVPB (0 g Intravenous Stopped 07/01/21 0609)                                                                                                                                     Procedures .Critical Care Performed by: Fatima Blank, MD Authorized by: Fatima Blank, MD   Critical care provider statement:    Critical care time (minutes):  60   Critical  care time was exclusive of:  Separately billable procedures and treating other patients   Critical care was necessary to treat or prevent imminent or life-threatening deterioration of the following conditions:  Sepsis   Critical care was time spent personally by me on the following activities:  Development of treatment plan with patient or surrogate, discussions with consultants, evaluation of patient's response to treatment, examination of patient, obtaining history from patient or surrogate, review of old charts, re-evaluation of patient's condition, pulse oximetry, ordering and review of radiographic studies, ordering and review of laboratory studies and ordering and performing treatments and interventions   Care discussed with: admitting provider    (including critical care time)  Medical Decision Making / ED Course    Complexity of Problem:  Co-morbidities/SDOH that complicate the patient evaluation/care: Noted in HPI  Additional history obtained: From recent hospitalization described in HPI  Patient's presenting problem/concern and DDX listed below: Fever Likely source is urinary given known infection. We will get labs to assess for sepsis.     Complexity of Data:   Cardiac Monitoring: The patient was maintained on a cardiac monitor.   I personally viewed and interpreted the cardiac monitored which showed an underlying rhythm of normal sinus rhythm with rates in the 80s.  No dysrhythmias or blocks  Laboratory Tests ordered listed below with my independent interpretation: CBC with leukocytosis.  Stable hemoglobin Metabolic panel without significant electrolyte derangements. Mild hyperglycemia without evidence of DKA. Stable renal insufficiency. UA consistent with urinary tract infection. Lactic acid normal   Imaging Studies ordered listed below with my independent interpretation: Chest x-ray with chronic interstitial scarring.  CT stone study without any acute  changes. Radiology confirmed and noted good position of the ureteral stent.     ED Course:    Hospitalization Considered:  Yes  Assessment, Intervention, and Reassessment: Fever Mild sepsis related to urinary tract infection. Patient started on IV antibiotics. Urine culture sent. Discussed case with Dr. Abner Greenspan from urology who recommended continued IV antibiotics.  He did not think that any acute intervention was required at this time given the good positioning of the stent but he will follow along during admission. We will admit to medicine for further management. Dr. Heinz Knuckles agreed to admit patient. Appreciate her assistance   Final Clinical Impression(s) / ED Diagnoses Final diagnoses:  Pyelonephritis  Sepsis without acute organ dysfunction, due to unspecified organism [facility]           This chart was dictated using voice recognition software.  Despite best efforts to proofread,  errors can occur which can change the documentation meaning.    Fatima Blank, MD 07/01/21 [phone]

## 2021-07-01 NOTE — Assessment & Plan Note (Signed)
-  Continue Pravachol ?

## 2021-07-01 NOTE — Consult Note (Signed)
Urology Consult   Physician requesting consult: Addison Lank, MD  Reason for consult: Sepsis, UTI, ureteral stone with stent in place.  History of Present Illness: Jenny Kelly is a 76 year old female with history of urosepsis secondary to E. coli bacteremia, kidney stones and is status post left ureteral stent due to obstructing 7 mm proximal ureteral stone on 04/27/2021.  She does have significant past medical history for heart failure, emphysema, diabetes type 2, A-fib and a right MCA stroke.  She followed-up in the office with Dr. Lovena Neighbours on 06/01/2021 and CT A/P demonstrated stable well-positioned left nephroureteral stent as well as a persistent 4 mm proximal left ureteral stone fragment unchanged since prior position.  Dr. Lovena Neighbours was planning on definitive treatment with ureteroscopy with laser lithotripsy however she has not been cleared for surgery.  He reached out to Dr. Agustin Cree and they would like to postpone her surgery 2 months due to recent stroke, cardiac stents and the need for antiplatelet therapy.  She presented the ED on 07/01/2021 febrile at 102.  She also complained of vulvar discomfort over the past several days. Denies significant abdominal or flank pain. Urine culture is pending.   Past Medical History:  Diagnosis Date   Anemia    Anxiety    Arthritis    Asthma    Atrial fibrillation ([place])    Blood transfusion without reported diagnosis    Cataract    CHF (congestive heart failure) (HCC)    Chronic kidney disease    Clotting disorder (HCC)    COPD (chronic obstructive pulmonary disease) (HCC)    Depression    Diabetes mellitus without complication (HCC)    GERD (gastroesophageal reflux disease)    Heart murmur    Hyperlipidemia    Myocardial infarction ([place])    Neuromuscular disorder (HCC)    tremors   Seizures ([place])    Sleep apnea    Stroke ([place])    Thyroid disease     Past Surgical History:  Procedure Laterality Date   ABDOMINAL HYSTERECTOMY      APPENDECTOMY     bladder tack     CHOLECYSTECTOMY     CORONARY ARTERY BYPASS GRAFT     CYSTOSCOPY W/ URETERAL STENT PLACEMENT Left 05/01/2021   Procedure: CYSTOSCOPY WITH RETROGRADE PYELOGRAM/URETERAL STENT PLACEMENT;  Surgeon: Ceasar Mons, MD;  Location: [place];  Service: Urology;  Laterality: Left;   ESOPHAGOGASTRODUODENOSCOPY ENDOSCOPY     EYE SURGERY     FRACTURE SURGERY Right    3rd finger   JOINT REPLACEMENT Bilateral    knees   TONSILLECTOMY AND ADENOIDECTOMY     TUBAL LIGATION       Current Hospital Medications:  Home meds:  No current facility-administered medications on file prior to encounter.   Current Outpatient Medications on File Prior to Encounter  Medication Sig Dispense Refill   aspirin 81 MG EC tablet Take 1 tablet (81 mg total) by mouth daily. Swallow whole. 30 tablet 0   budesonide (PULMICORT) 0.5 MG/2ML nebulizer solution Take 2 mLs (0.5 mg total) by nebulization in the morning and at bedtime. 120 mL 0   donepezil (ARICEPT) 10 MG tablet Take 1 tablet (10 mg total) by mouth at bedtime. 30 tablet 0   HYDROcodone-acetaminophen (NORCO/VICODIN) 5-325 MG tablet Take 1 tablet by mouth every 6 (six) hours as needed for moderate pain.     levofloxacin (LEVAQUIN) 500 MG tablet Take 500 mg by mouth daily.     levothyroxine (SYNTHROID) 88 MCG tablet Take  88 mcg by mouth daily before breakfast.     montelukast (SINGULAIR) 10 MG tablet Take 1 tablet (10 mg total) by mouth at bedtime. 30 tablet 0   Multiple Vitamin (MULTIVITAMIN WITH MINERALS) TABS tablet Take 1 tablet by mouth daily.     omeprazole (PRILOSEC) 40 MG capsule Take 1 capsule (40 mg total) by mouth daily. 30 capsule 0   Phenazopyridine HCl (URISTAT PO) Take 1 tablet by mouth as needed (UTI).     pravastatin (PRAVACHOL) 40 MG tablet Take 1 tablet (40 mg total) by mouth daily at 6 PM. 30 tablet 0   pregabalin (LYRICA) 50 MG capsule TAKE 1 CAPSULE BY MOUTH 2 TIMES DAILY. (Patient taking differently: Take  50 mg by mouth 2 (two) times daily.) 60 capsule 0   Rivaroxaban (XARELTO) 15 MG TABS tablet Take 1 tablet (15 mg total) by mouth daily. 90 tablet 0   topiramate (TOPAMAX) 200 MG tablet Take 1 tablet (200 mg total) by mouth 2 (two) times daily. 60 tablet 0   albuterol (PROVENTIL HFA) 108 (90 Base) MCG/ACT inhaler Inhale two puffs every 4-6 hours if needed for coughing, wheezing, or shortness of breath. (Patient not taking: Reported on 07/01/2021) 1 each 1   ANORO ELLIPTA 62.5-25 MCG/ACT AEPB TAKE 1 PUFF BY MOUTH EVERY DAY (Patient not taking: Reported on 07/01/2021) 60 each 1   azelastine (ASTELIN) 0.1 % nasal spray Use one spray in each nostril twice daily (Patient not taking: Reported on 07/01/2021) 30 mL 5   butalbital-acetaminophen-caffeine (FIORICET) 50-325-40 MG tablet Take 1 tablet by mouth daily as needed for migraine. (Patient not taking: Reported on 07/01/2021) 5 tablet 0   famotidine (PEPCID) 20 MG tablet Take one tablet once daily (Patient not taking: Reported on 07/01/2021) 30 tablet 0   fluticasone (FLONASE) 50 MCG/ACT nasal spray SPRAY 1 SPRAY INTO BOTH NOSTRILS DAILY. (Patient not taking: Reported on 07/01/2021) 48 mL 2   ipratropium (ATROVENT) 0.03 % nasal spray SPRAY 2 SPRAYS INTO BOTH NOSTRILS EVERY 12 HOURS. (Patient not taking: Reported on 07/01/2021) 90 mL 3   lipase/protease/amylase (CREON) 12000-38000 units CPEP capsule Take 1 capsule (12,000 Units total) by mouth 3 (three) times daily before meals. (Patient not taking: Reported on 07/01/2021) 270 capsule 0     Scheduled Meds:  aspirin EC  81 mg Oral Daily   budesonide  0.5 mg Nebulization BID   famotidine  20 mg Oral Daily   [START ON 07/02/2021] levothyroxine  88 mcg Oral QAC breakfast   montelukast  10 mg Oral QHS   pantoprazole  40 mg Oral Daily   pravastatin  40 mg Oral q1800   pregabalin  50 mg Oral BID   sodium chloride flush  3 mL Intravenous Q12H   topiramate  200 mg Oral BID   Continuous Infusions:  [START ON  07/02/2021] ceFEPime (MAXIPIME) IV     lactated ringers     PRN Meds:.acetaminophen **OR** acetaminophen, morphine injection, oxyCODONE  Allergies:  Allergies  Allergen Reactions   Ciprofloxacin Hives    Ask patient   Prochlorperazine Edisylate Other (See Comments)    Cant swallow Muscle cramping   Prednisone Other (See Comments)    unknown   Reduced Iso-Alpha Acids Complex Other (See Comments)    unknown   Statins Other (See Comments)    Muscle weakness    Sulfa Antibiotics Swelling   Azithromycin Rash   Latex Rash    Family History  Problem Relation Age of Onset   Heart disease  Mother    Heart attack Mother    Breast cancer Mother    Heart disease Sister     Social History:  reports that she quit smoking about 21 years ago. Her smoking use included cigarettes. She has a 15.00 pack-year smoking history. She has never used smokeless tobacco. She reports that she does not currently use alcohol. She reports that she does not use drugs.  ROS: A complete review of systems was performed.  All systems are negative except for pertinent findings as noted.  Physical Exam:  Vital signs in last 24 hours: Temp:  [100.1 F (37.8 C)] 100.1 F (37.8 C) (03/11 0333) Pulse Rate:  [86-104] 88 (03/11 1000) Resp:  [10-25] 24 (03/11 1000) BP: (99-139)/(47-72) 106/50 (03/11 1000) SpO2:  [92 %-100 %] 99 % (03/11 1000) Constitutional:  Alert and oriented, No acute distress Cardiovascular: Regular rate and rhythm Respiratory: Normal respiratory effort, Lungs clear bilaterally GI: Abdomen is soft, nontender, nondistended, no abdominal masses GU: No CVA tenderness Neurologic: Grossly intact, no focal deficits Psychiatric: Normal mood and affect  Laboratory Data:  Recent Labs    07/01/21 0335  WBC 13.6*  HGB 11.3*  HCT 37.8  PLT 196    Recent Labs    07/01/21 0335  NA 143  K 4.2  CL 115*  GLUCOSE 139*  BUN 22  CALCIUM 8.3*  CREATININE 1.49*     Results for orders  placed or performed during the hospital encounter of 07/01/21 (from the past 24 hour(s))  Comprehensive metabolic panel     Status: Abnormal   Collection Time: 07/01/21  3:35 AM  Result Value Ref Range   Sodium 143 135 - 145 mmol/L   Potassium 4.2 3.5 - 5.1 mmol/L   Chloride 115 (H) 98 - 111 mmol/L   CO2 21 (L) 22 - 32 mmol/L   Glucose, Bld 139 (H) 70 - 99 mg/dL   BUN 22 8 - 23 mg/dL   Creatinine, Ser 1.49 (H) 0.44 - 1.00 mg/dL   Calcium 8.3 (L) 8.9 - 10.3 mg/dL   Total Protein 6.5 6.5 - 8.1 g/dL   Albumin 2.5 (L) 3.5 - 5.0 g/dL   AST 14 (L) 15 - 41 U/L   ALT 6 0 - 44 U/L   Alkaline Phosphatase 71 38 - 126 U/L   Total Bilirubin 0.1 (L) 0.3 - 1.2 mg/dL   GFR, Estimated 36 (L) >60 mL/min   Anion gap 7 5 - 15  CBC     Status: Abnormal   Collection Time: 07/01/21  3:35 AM  Result Value Ref Range   WBC 13.6 (H) 4.0 - 10.5 K/uL   RBC 4.07 3.87 - 5.11 MIL/uL   Hemoglobin 11.3 (L) 12.0 - 15.0 g/dL   HCT 37.8 36.0 - 46.0 %   MCV 92.9 80.0 - 100.0 fL   MCH 27.8 26.0 - 34.0 pg   MCHC 29.9 (L) 30.0 - 36.0 g/dL   RDW 16.5 (H) 11.5 - 15.5 %   Platelets 196 150 - 400 K/uL   nRBC 0.0 0.0 - 0.2 %  Urinalysis, Routine w reflex microscopic     Status: Abnormal   Collection Time: 07/01/21  3:35 AM  Result Value Ref Range   Color, Urine YELLOW YELLOW   APPearance HAZY (A) CLEAR   Specific Gravity, Urine 1.009 1.005 - 1.030   pH 5.0 5.0 - 8.0   Glucose, UA NEGATIVE NEGATIVE mg/dL   Hgb urine dipstick LARGE (A) NEGATIVE   Bilirubin Urine  NEGATIVE NEGATIVE   Ketones, ur NEGATIVE NEGATIVE mg/dL   Protein, ur NEGATIVE NEGATIVE mg/dL   Nitrite POSITIVE (A) NEGATIVE   Leukocytes,Ua LARGE (A) NEGATIVE   RBC / HPF >50 (H) 0 - 5 RBC/hpf   WBC, UA 21-50 0 - 5 WBC/hpf   Bacteria, UA FEW (A) NONE SEEN   Squamous Epithelial / LPF 0-5 0 - 5   Mucus PRESENT   Protime-INR     Status: Abnormal   Collection Time: 07/01/21  3:35 AM  Result Value Ref Range   Prothrombin Time 17.3 (H) 11.4 - 15.2 seconds    INR 1.4 (H) 0.8 - 1.2  APTT     Status: Abnormal   Collection Time: 07/01/21  3:35 AM  Result Value Ref Range   aPTT 37 (H) 24 - 36 seconds  Lactic acid, plasma     Status: None   Collection Time: 07/01/21  3:47 AM  Result Value Ref Range   Lactic Acid, Venous 1.1 0.5 - 1.9 mmol/L  Resp Panel by RT-PCR (Flu A&B, Covid) Nasopharyngeal Swab     Status: None   Collection Time: 07/01/21  6:29 AM   Specimen: Nasopharyngeal Swab; Nasopharyngeal(NP) swabs in vial transport medium  Result Value Ref Range   SARS Coronavirus 2 by RT PCR NEGATIVE NEGATIVE   Influenza A by PCR NEGATIVE NEGATIVE   Influenza B by PCR NEGATIVE NEGATIVE   Recent Results (from the past 240 hour(s))  Resp Panel by RT-PCR (Flu A&B, Covid) Nasopharyngeal Swab     Status: None   Collection Time: 07/01/21  6:29 AM   Specimen: Nasopharyngeal Swab; Nasopharyngeal(NP) swabs in vial transport medium  Result Value Ref Range Status   SARS Coronavirus 2 by RT PCR NEGATIVE NEGATIVE Final    Comment: (NOTE) SARS-CoV-2 target nucleic acids are NOT DETECTED.  The SARS-CoV-2 RNA is generally detectable in upper respiratory specimens during the acute phase of infection. The lowest concentration of SARS-CoV-2 viral copies this assay can detect is 138 copies/mL. A negative result does not preclude SARS-Cov-2 infection and should not be used as the sole basis for treatment or other patient management decisions. A negative result may occur with  improper specimen collection/handling, submission of specimen other than nasopharyngeal swab, presence of viral mutation(s) within the areas targeted by this assay, and inadequate number of viral copies(<138 copies/mL). A negative result must be combined with clinical observations, patient history, and epidemiological information. The expected result is Negative.  Fact Sheet for Patients:  EntrepreneurPulse.com.au  Fact Sheet for Healthcare Providers:   IncredibleEmployment.be  This test is no t yet approved or cleared by the [place] FDA and  has been authorized for detection and/or diagnosis of SARS-CoV-2 by FDA under an Emergency Use Authorization (EUA). This EUA will remain  in effect (meaning this test can be used) for the duration of the COVID-19 declaration under Section 564(b)(1) of the Act, 21 U.S.C.section 360bbb-3(b)(1), unless the authorization is terminated  or revoked sooner.       Influenza A by PCR NEGATIVE NEGATIVE Final   Influenza B by PCR NEGATIVE NEGATIVE Final    Comment: (NOTE) The Xpert Xpress SARS-CoV-2/FLU/RSV plus assay is intended as an aid in the diagnosis of influenza from Nasopharyngeal swab specimens and should not be used as a sole basis for treatment. Nasal washings and aspirates are unacceptable for Xpert Xpress SARS-CoV-2/FLU/RSV testing.  Fact Sheet for Patients: EntrepreneurPulse.com.au  Fact Sheet for Healthcare Providers: IncredibleEmployment.be  This test is not yet approved or  cleared by the [place] and has been authorized for detection and/or diagnosis of SARS-CoV-2 by FDA under an Emergency Use Authorization (EUA). This EUA will remain in effect (meaning this test can be used) for the duration of the COVID-19 declaration under Section 564(b)(1) of the Act, 21 U.S.C. section 360bbb-3(b)(1), unless the authorization is terminated or revoked.  Performed at [facility], [place] [address], [place], [place] [address]     Renal Function: Recent Labs    07/01/21 0335  CREATININE 1.49*   Estimated Creatinine Clearance: 29 mL/min (A) (by C-G formula based on SCr of 1.49 mg/dL (H)).  Radiologic Imaging: DG Chest Port 1 View  Result Date: 07/01/2021 CLINICAL DATA:  Questionable sepsis. EXAM: PORTABLE CHEST 1 VIEW COMPARISON:  05/14/2021 FINDINGS: Postoperative changes in the mediastinum. Heart size and  pulmonary vascularity are normal. Diffuse interstitial changes throughout the lungs, similar to prior study. This is likely chronic fibrosis. No focal consolidation. No pleural effusions. No pneumothorax. Mediastinal contours appear intact. Calcification of the aorta. Degenerative changes in the spine and shoulders. Surgical clips in the right upper quadrant. IMPRESSION: Chronic interstitial pattern to the lungs without change. No evidence of active pulmonary disease. Electronically Signed   By: Lucienne Capers M.D.   On: 07/01/2021 04:02   CT Renal Stone Study  Result Date: 07/01/2021 CLINICAL DATA:  76 year old female with history of urinary tract infection. Possible nephrolithiasis. EXAM: CT ABDOMEN AND PELVIS WITHOUT CONTRAST TECHNIQUE: Multidetector CT imaging of the abdomen and pelvis was performed following the standard protocol without IV contrast. RADIATION DOSE REDUCTION: This exam was performed according to the departmental dose-optimization program which includes automated exposure control, adjustment of the mA and/or kV according to patient size and/or use of iterative reconstruction technique. COMPARISON:  CT of the abdomen and pelvis 06/01/2021. FINDINGS: Lower chest: Extensive areas of cylindrical bronchiectasis, thickening of the peribronchovascular interstitium and regional architectural distortion and volume loss are again noted in the lung bases, most severe in the lower lobes, likely progressive post infectious or inflammatory scarring, although the possibility of active infection or sequela of recent aspiration is not excluded. Atherosclerotic calcifications in the thoracic aorta as well as the left main, left anterior descending, left circumflex and right coronary arteries. Hepatobiliary: No definite suspicious cystic or solid hepatic lesions are confidently identified on today's noncontrast CT examination. A few scattered tiny calcified granulomas are noted throughout the liver. Status  post cholecystectomy. Pancreas: Numerous coarse calcifications are noted throughout the pancreas, along with diffuse pancreatic atrophy, which is likely sequela of chronic pancreatitis. No discrete pancreatic mass confidently identified on today's noncontrast CT examination. No peripancreatic fluid collections or inflammatory changes. Spleen: Numerous calcified granulomas throughout the spleen. Small splenule medial to the spleen. Adrenals/Urinary Tract: In the lower pole collecting system of the left kidney there is a 9 mm nonobstructive calculus. Left-sided double-J ureteral stent appears appropriately located with the proximal loop reformed in the left renal pelvis and distal loop reformed in the urinary bladder. No additional calculi are confidently identified within the right renal collecting system, along the course of either ureter, or within the lumen of the urinary bladder. There is mild fullness in the left renal pelvis, but no frank hydroureteronephrosis. Unenhanced appearance of the right kidney, bilateral adrenal glands and urinary bladder is unremarkable. Stomach/Bowel: Unenhanced appearance of the stomach is normal. There is no pathologic dilatation of small bowel or colon. The appendix is not confidently identified and may be surgically absent. Regardless, there are  no inflammatory changes noted adjacent to the cecum to suggest the presence of an acute appendicitis at this time. Vascular/Lymphatic: Aortic atherosclerosis. 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 No lymphadenopathy noted in the abdomen or pelvis. Reproductive: Status post hysterectomy. Ovaries are not confidently identified may be surgically absent or atrophic. Other: No significant volume of ascites.  No pneumoperitoneum. Musculoskeletal: There are no aggressive appearing lytic or blastic lesions noted in the visualized portions of the skeleton. IMPRESSION: 1. 9 mm  nonobstructive calculus in the lower pole collecting system of the left kidney. Left-sided double-J ureteral stent remains appropriately located. There is some very mild fullness in the left renal pelvis, without frank left hydroureteronephrosis to suggest substantial stent malfunction. 2. The appearance of the lung bases suggests substantial chronic post infectious or inflammatory scarring, although active multilobar bronchopneumonia or aspiration pneumonitis is not excluded. Further clinical evaluation is recommended. 3. Aortic atherosclerosis, in addition to left main and three-vessel coronary artery disease. 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 4. Additional incidental findings, as above. Electronically Signed   By: Vinnie Langton M.D.   On: 07/01/2021 05:20    I independently reviewed the above imaging studies.  Impression/Recommendation: #1.  Sepsis due to urinary source: Urine culture pending.  Initially febrile at 102 on presentation #2.  Proximal right ureteral stone: S/p right ureteral stent in 04/2021 #3.  Significant comorbidities including right MCA stroke, A-fib and CAD  -I reviewed her record and Dr. Lovena Neighbours is planning on definitive treatment of her ureteral stone after she is reportedly cleared for the surgery.  According to the notes, he reached out to Dr. Agustin Cree and recommended postponing her surgery approximately 49-month given her recent stroke, cardiac stents and need for antiplatelet surgery. -Stent remains in preposition on CT A/P 07/01/2021 -Follow-up urine culture.  Continue antibiotics. -I will notify Dr. Lovena Neighbours of her admission.  She will follow-up with him as scheduled. -Following peripherally. Please call with questions.  Matt R. Leonna Schlee MD 07/01/2021, 11:20 AM  [facility]  Pager: [phone]   CC: Addison Lank, MD

## 2021-07-01 NOTE — Assessment & Plan Note (Signed)
-  Continue CPAP

## 2021-07-01 NOTE — ED Notes (Signed)
Lab contacted to run PT/PTT and microbiology contacted to run blood cultures now that order is placed  ?

## 2021-07-01 NOTE — Assessment & Plan Note (Signed)
-  s/p stent ?-Continue ASA ?

## 2021-07-01 NOTE — Assessment & Plan Note (Addendum)
-  Noted on imaging today ?-Continue Pulmicort, Singulair ?-Hold Anoro, Albuterol due to prolonged QTc ?

## 2021-07-01 NOTE — ED Triage Notes (Signed)
Pt from home with [facility] for abd pain. Was seen at [facility] yesterday for UTI and discharged; pt started PO antibiotic at home 06/30/2021. Hx of UTI and kidney stones with stent placement. EMS gave NS enroute for map of 57. Pt reports abd pain x 2 weeks with fever onset x 1 day ?

## 2021-07-01 NOTE — TOC Initial Note (Signed)
Transition of Care (TOC) - Initial/Assessment Note  ? ? ?Patient Details  ?Name: Jenny Kelly ?MRN: [id number] ?Date of Birth: 08-30-1945 ? ?Transition of Care (TOC) CM/SW Contact:    ?Verdell Carmine, RN ?Phone Number: ?07/01/2021, 11:45 AM ? ?Clinical Narrative:                 ? ?Transition of Care Department [facility]) has reviewed patient and no TOC needs have been identified at this time. We will continue to monitor patient advancement through interdisciplinary progression rounds. If new patient transition needs arise, please place a TOC consult. ?  ?  ?  ?  ? ? ?Patient Goals and CMS Choice ?  ?  ?  ? ?Expected Discharge Plan and Services ?  ?  ?  ?  ?  ?                ?  ?  ?  ?  ?  ?  ?  ?  ?  ?  ? ?Prior Living Arrangements/Services ?  ?  ?  ?       ?  ?  ?  ?  ? ?Activities of Daily Living ?  ?  ? ?Permission Sought/Granted ?  ?  ?   ?   ?   ?   ? ?Emotional Assessment ?  ?  ?  ?  ?  ?  ? ?Admission diagnosis:  Sepsis secondary to UTI ([place]) [A41.9, N39.0] ?Patient Active Problem List  ? Diagnosis Date Noted  ? Sepsis secondary to UTI ([place]) 07/01/2021  ? Prolonged QT interval 07/01/2021  ? Sleep apnea 06/09/2021  ? Seizures ([employer]) 06/09/2021  ? Neuromuscular disorder ([place]) 06/09/2021  ? Myocardial infarction ([place]) 06/09/2021  ? Heart murmur 06/09/2021  ? GERD (gastroesophageal reflux disease) 06/09/2021  ? Depression 06/09/2021  ? COPD (chronic obstructive pulmonary disease) ([place]) 06/09/2021  ? Clotting disorder ([place]) 06/09/2021  ? CHF (congestive heart failure) ([place]) 06/09/2021  ? Cataract 06/09/2021  ? Blood transfusion without reported diagnosis 06/09/2021  ? Asthma 06/09/2021  ? Arthritis 06/09/2021  ? Anxiety 06/09/2021  ? Anemia 06/09/2021  ? Allergy 06/09/2021  ? 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 E coli bacteremia 05/08/2021  ? Pulmonary fibrosis ([place]) 05/08/2021  ? S/P ureteral stent placement 05/08/2021  ? Cerebral infarction due to thrombosis of right middle cerebral artery ([place]) 05/07/2021  ?  Bradycardia   ? Wide-complex tachycardia   ? Right middle cerebral artery stroke ([place]) 05/01/2021  ? Sepsis with acute hypoxic respiratory failure without septic shock ([place])   ? Hyperkalemia   ? AKI (acute kidney injury) ([place])   ? Polypharmacy 12/26/2017  ? Post-concussion headache 12/26/2017  ? Tremor 02/21/2016  ? Memory deficit 01/10/2016  ? Risk for falls 11/22/2015  ? CKD (chronic kidney disease), stage III ([place]) 06/21/2015  ? Hypothyroidism 06/21/2015  ? Hyperlipidemia 06/21/2015  ? PAF (paroxysmal atrial fibrillation) ([place]) 06/21/2015  ? Coronary artery disease involving native coronary artery of native heart 03/10/2014  ? 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 ?PCP:  Townsend Roger, MD ?Pharmacy:   ?[id number] - [place], [place] ?[address] ?[place] [address] ?Phone: [phone] Fax: [phone] ? ? ? ? ?Social Determinants of Health (SDOH) Interventions ?  ? ?Readmission Risk Interventions ?No flowsheet data found. ? ? ?

## 2021-07-01 NOTE — Sepsis Progress Note (Signed)
Following for sepsis monitoring ?

## 2021-07-02 DIAGNOSIS — N390 Urinary tract infection, site not specified: Secondary | ICD-10-CM | POA: Diagnosis not present

## 2021-07-02 DIAGNOSIS — A419 Sepsis, unspecified organism: Secondary | ICD-10-CM | POA: Diagnosis not present

## 2021-07-02 LAB — BASIC METABOLIC PANEL
Anion gap: 6 (ref 5–15)
BUN: 19 mg/dL (ref 8–23)
CO2: 22 mmol/L (ref 22–32)
Calcium: 7.7 mg/dL — ABNORMAL LOW (ref 8.9–10.3)
Chloride: 112 mmol/L — ABNORMAL HIGH (ref 98–111)
Creatinine, Ser: 1.35 mg/dL — ABNORMAL HIGH (ref 0.44–1.00)
GFR, Estimated: 41 mL/min — ABNORMAL LOW (ref >60)
Glucose, Bld: 117 mg/dL — ABNORMAL HIGH (ref 70–99)
Potassium: 3.5 mmol/L (ref 3.5–5.1)
Sodium: 140 mmol/L (ref 135–145)

## 2021-07-02 LAB — CBC
HCT: 32.3 % — ABNORMAL LOW (ref 36.0–46.0)
Hemoglobin: 9.8 g/dL — ABNORMAL LOW (ref 12.0–15.0)
MCH: 27.5 pg (ref 26.0–34.0)
MCHC: 30.3 g/dL (ref 30.0–36.0)
MCV: 90.7 fL (ref 80.0–100.0)
Platelets: 149 10*3/uL — ABNORMAL LOW (ref 150–400)
RBC: 3.56 MIL/uL — ABNORMAL LOW (ref 3.87–5.11)
RDW: 16.5 % — ABNORMAL HIGH (ref 11.5–15.5)
WBC: 11.7 10*3/uL — ABNORMAL HIGH (ref 4.0–10.5)
nRBC: 0 % (ref 0.0–0.2)

## 2021-07-02 LAB — HEPARIN LEVEL (UNFRACTIONATED): Heparin Unfractionated: 0.29 IU/mL — ABNORMAL LOW (ref 0.30–0.70)

## 2021-07-02 LAB — APTT: aPTT: 132 seconds — ABNORMAL HIGH (ref 24–36)

## 2021-07-02 MED ORDER — RIVAROXABAN 15 MG PO TABS
15.0000 mg | ORAL_TABLET | Freq: Every day | ORAL | Status: DC
  Administered 2021-07-02 – 2021-07-06 (×5): 15 mg via ORAL
  Filled 2021-07-02 (×5): qty 1

## 2021-07-02 MED ORDER — SODIUM CHLORIDE 0.9 % IV SOLN
2.0000 g | Freq: Two times a day (BID) | INTRAVENOUS | Status: DC
  Administered 2021-07-02 – 2021-07-05 (×6): 2 g via INTRAVENOUS
  Filled 2021-07-02 (×6): qty 2

## 2021-07-02 MED ORDER — DIAZEPAM 2 MG PO TABS
2.0000 mg | ORAL_TABLET | Freq: Once | ORAL | Status: AC
  Administered 2021-07-02: 2 mg via ORAL
  Filled 2021-07-02: qty 1

## 2021-07-02 NOTE — Hospital Course (Addendum)
SESHA SZEWCZYK is a 76 y.o. female with medical history significant of afib; chronic diastolic CHF (grade 2 on echo on 05/02/21); COPD; DM; HLD; OSA; CVA; dementia; hypothyroidism; and seizures presenting with fever.   She was seen at [facility] yesterday for UTI and discharged with PO antibiotics.  She was last admitted here from 1/9-15 (and then to [facility]) for acute ischemic stroke in conjunction with septic shock from pyelo/L renal calculus s/p L ureteral stenting.  She reports that she has had pain in her "punanny" (vagina) since the stent was placed.  She had worsening pain the past few days and so went to [facility] overnight yesterday.  She was not febrile and an evaluation showed UTI so she was given oral abx and pain meds and discharged.  The pain was intolerable overnight and she developed fever and so they came here.  Her pain is still severe in her vagina.  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  Pain is currently her biggest concern- was not responsive to valium.

## 2021-07-02 NOTE — Evaluation (Signed)
Occupational Therapy Evaluation Patient Details Name: Jenny Kelly MRN: [id number] DOB: 06-17-1945 [name]: 07/02/2021   History of Present Illness This 76 y.o. female admitted with vaginal pain and fever.  Dx:  sepsis secondary to UTI.  PMH includes:  recent ureteral stent placment, COPD, seizures, sleep apnea, pulmonary fibrosis, recent Rt MCA CVA, h/o mild cognitive impairment, PAF, CAD, CKD stage III   Clinical Impression   Pt admitted with above. She demonstrates the below listed deficits and will benefit from continued OT to maximize safety and independence with BADLs.  Pt presents to OT with generalized weakness, Lt UE weakness and residual deficit from prior CVA, cognitive impairment, visual/perceptual deficit, impaired balance, and pain.  Pt currently requires up to mod A for ADLs, and min A for functional mobility.  She lives with spouse who assists her as needed.  Per notes from [facility], at time of discharge from there in 1/23, she was supervision with ADLs and ambulation.  Recommend HHOT.  Will follow.        Recommendations for follow up therapy are one component of a multi-disciplinary discharge planning process, led by the attending physician.  Recommendations may be updated based on patient status, additional functional criteria and insurance authorization.   Follow Up Recommendations  Home health OT    Assistance Recommended at Discharge Frequent or constant Supervision/Assistance  Patient can return home with the following A little help with walking and/or transfers;A lot of help with bathing/dressing/bathroom;Assistance with cooking/housework;Direct supervision/assist for medications management;Help with stairs or ramp for entrance;Assist for transportation;Direct supervision/assist for financial management    Functional Status Assessment  Patient has had a recent decline in their functional status and demonstrates the ability to make significant improvements in function  in a reasonable and predictable amount of time.  Equipment Recommendations  None recommended by OT    Recommendations for Other Services       Precautions / Restrictions Precautions Precautions: Fall      Mobility Bed Mobility Overal bed mobility: Needs Assistance Bed Mobility: Supine to Sit     Supine to sit: Mod assist     General bed mobility comments: assist to lift trunk    Transfers Overall transfer level: Needs assistance Equipment used: 1 person hand held assist Transfers: Sit to/from Stand, Bed to chair/wheelchair/BSC Sit to Stand: Min assist Stand pivot transfers: Min assist   Step pivot transfers: Min assist     General transfer comment: pt requires HHA and min A to steady      Balance Overall balance assessment: Needs assistance Sitting-balance support: Feet supported Sitting balance-Leahy Scale: Fair     Standing balance support: Single extremity supported Standing balance-Leahy Scale: Poor                             ADL either performed or assessed with clinical judgement   ADL Overall ADL's : Needs assistance/impaired Eating/Feeding: Set up;Sitting   Grooming: Wash/dry hands;Wash/dry face;Oral care;Minimal assistance;Sitting   Upper Body Bathing: Moderate assistance;Sitting   Lower Body Bathing: Moderate assistance;Sit to/from stand   Upper Body Dressing : Moderate assistance;Sitting   Lower Body Dressing: Maximal assistance;Sit to/from stand   Toilet Transfer: Minimal assistance;Ambulation;Comfort height toilet;Grab bars   Toileting- Clothing Manipulation and Hygiene: Maximal assistance;Sit to/from stand       Functional mobility during ADLs: Moderate assistance       Vision   Additional Comments: Per review of notes from [facility], pt had  Lt inattention which was present during eval today     Perception     Praxis      Pertinent Vitals/Pain Pain Assessment Pain Assessment: Faces Faces Pain Scale: Hurts even  more Pain Location: vaginal area Pain Descriptors / Indicators: Grimacing, Guarding, Moaning Pain Intervention(s): Monitored during session     Hand Dominance Right   Extremity/Trunk Assessment Upper Extremity Assessment Upper Extremity Assessment: LUE deficits/detail LUE Deficits / Details: h/o stroke with residual weakness and neuropathic pain.  PROM of shoulder ~60*. Elbow distally WFL PROM LUE Coordination: decreased gross motor;decreased fine motor   Lower Extremity Assessment Lower Extremity Assessment: Defer to PT evaluation   Cervical / Trunk Assessment Cervical / Trunk Assessment: Kyphotic   Communication Communication Communication: HOH   Cognition Arousal/Alertness: Awake/alert Behavior During Therapy: Flat affect Overall Cognitive Status: No family/caregiver present to determine baseline cognitive functioning                                       General Comments       Exercises     Shoulder Instructions      Home Living Family/patient expects to be discharged to:: Private residence Living Arrangements: Spouse/significant other Available Help at Discharge: Family;Available 24 hours/day Type of Home: House Home Access: Level entry     Home Layout: One level     Bathroom Shower/Tub: Tub/shower unit;Door   [employer] Toilet: Standard Bathroom Accessibility: Yes   Home Equipment: Conservation officer, nature (2 wheels);BSC/3in1;Shower seat          Prior Functioning/Environment Prior Level of Function : Needs assist             Mobility Comments: per review of notes from [facility], at time of discharge, pt was ambulating with supervision without AD ADLs Comments: Per review of [facility] notes at time of discharge, pt was able to complete ADLs with supervision.  Pt, however, reports that spouse assists her        OT Problem List: Decreased strength;Decreased range of motion;Decreased activity tolerance;Impaired balance (sitting and/or  standing);Impaired vision/perception;Decreased coordination;Decreased cognition;Decreased safety awareness;Decreased knowledge of use of DME or AE;Impaired UE functional use;Impaired tone;Pain;Cardiopulmonary status limiting activity      OT Treatment/Interventions: Self-care/ADL training;Therapeutic exercise;Neuromuscular education;DME and/or AE instruction;Manual therapy;Therapeutic activities;Cognitive remediation/compensation;Visual/perceptual remediation/compensation;Patient/family education;Balance training    OT Goals(Current goals can be found in the care plan section) Acute Rehab OT Goals Patient Stated Goal: did not state OT Goal Formulation: With patient Time For Goal Achievement: 07/16/21 Potential to Achieve Goals: Good ADL Goals Pt Will Perform Grooming: with supervision;standing Pt Will Perform Upper Body Bathing: with set-up;sitting Pt Will Perform Lower Body Bathing: with min guard assist;sit to/from stand Pt Will Perform Upper Body Dressing: with min assist;sitting Pt Will Perform Lower Body Dressing: sit to/from stand;with min assist Pt Will Transfer to Toilet: with supervision;ambulating;regular height toilet;grab bars;bedside commode Pt Will Perform Toileting - Clothing Manipulation and hygiene: with min guard assist;sit to/from stand  OT Frequency: Min 2X/week    Co-evaluation              AM-PAC OT "6 Clicks" Daily Activity     Outcome Measure Help from another person eating meals?: A Little Help from another person taking care of personal grooming?: A Little Help from another person toileting, which includes using toliet, bedpan, or urinal?: A Lot Help from another person bathing (including washing, rinsing, drying)?: A Lot Help from  another person to put on and taking off regular upper body clothing?: A Lot Help from another person to put on and taking off regular lower body clothing?: A Lot 6 Click Score: 14   End of Session Equipment Utilized During  Treatment: Oxygen Nurse Communication: Mobility status  Activity Tolerance: Patient limited by fatigue Patient left: in chair;with call bell/phone within reach;with chair alarm set  OT Visit Diagnosis: Unsteadiness on feet (R26.81);Cognitive communication deficit (R41.841);Pain Pain - Right/Left: Left Pain - part of body: Arm (vagina)                Time: [id number] OT Time Calculation (min): 22 min Charges:  OT General Charges $OT Visit: 1 Visit OT Evaluation $OT Eval Moderate Complexity: 1 Mod  Nilsa Nutting., OTR/L Acute Rehabilitation Services Pager [phone] Office [phone]   [name] 07/02/2021, 4:24 PM

## 2021-07-02 NOTE — Progress Notes (Signed)
ANTICOAGULATION CONSULT NOTE ? ?Pharmacy Consult for Heparin ?Indication: atrial fibrillation ? ?Allergies  ?Allergen Reactions  ? Ciprofloxacin Hives  ?  Ask patient  ? Prochlorperazine Edisylate Other (See Comments)  ?  Cant swallow ?Muscle cramping  ? Prednisone Other (See Comments)  ?  unknown  ? Reduced Iso-Alpha Acids Complex Other (See Comments)  ?  unknown  ? Statins Other (See Comments)  ?  Muscle weakness ?  ? Sulfa Antibiotics Swelling  ? Azithromycin Rash  ? Latex Rash  ? ? ?Patient Measurements: ?Height: 5\' 2"  (157.5 cm) ?Weight: 68.2 kg (150 lb 5.7 oz) ?IBW/kg (Calculated) : 50.1 ?Heparin Dosing Weight: 65 kg ? ?Vital Signs: ?Temp: 100.2 ?F (37.9 ?C) (03/12 0519) ?Temp Source: Oral (03/12 0519) ?BP: 141/72 (03/12 0519) ?Pulse Rate: 100 (03/12 0519) ? ?Labs: ?Recent Labs  ?  07/01/21 ?0335 07/01/21 ?1228 07/01/21 ?2035 07/02/21 ?0111 07/02/21 ?1209  ?HGB 11.3*  --   --  9.8*  --   ?HCT 37.8  --   --  32.3*  --   ?PLT 196  --   --  149*  --   ?APTT 37*  --  70*  --  132*  ?LABPROT 17.3*  --   --   --   --   ?INR 1.4*  --   --   --   --   ?HEPARINUNFRC  --  0.29* 0.19*  --  0.29*  ?CREATININE 1.49*  --   --  1.35*  --   ? ? ? ?Estimated Creatinine Clearance: 32.6 mL/min (A) (by C-G formula based on SCr of 1.35 mg/dL (H)). ? ? ?Medical History: ?Past Medical History:  ?Diagnosis Date  ? Anemia   ? Anxiety   ? Arthritis   ? Asthma   ? Atrial fibrillation ([place])   ? Blood transfusion without reported diagnosis   ? Cataract   ? CHF (congestive heart failure) ([place])   ? Chronic kidney disease   ? Clotting disorder ([place])   ? COPD (chronic obstructive pulmonary disease) ([place])   ? Depression   ? Diabetes mellitus without complication ([place])   ? GERD (gastroesophageal reflux disease)   ? Heart murmur   ? Hyperlipidemia   ? Myocardial infarction [facility])   ? Neuromuscular disorder ([place])   ? tremors  ? Seizures ([place])   ? Sleep apnea   ? Stroke [facility])   ? Thyroid disease   ? ? ?Medications:  ?Medications Prior to Admission   ?Medication Sig Dispense Refill Last Dose  ? aspirin 81 MG EC tablet Take 1 tablet (81 mg total) by mouth daily. Swallow whole. 30 tablet 0 06/30/2021  ? budesonide (PULMICORT) 0.5 MG/2ML nebulizer solution Take 2 mLs (0.5 mg total) by nebulization in the morning and at bedtime. 120 mL 0 06/30/2021  ? donepezil (ARICEPT) 10 MG tablet Take 1 tablet (10 mg total) by mouth at bedtime. 30 tablet 0 06/30/2021  ? HYDROcodone-acetaminophen (NORCO/VICODIN) 5-325 MG tablet Take 1 tablet by mouth every 6 (six) hours as needed for moderate pain.   06/30/2021  ? levofloxacin (LEVAQUIN) 500 MG tablet Take 500 mg by mouth daily.   06/30/2021  ? levothyroxine (SYNTHROID) 88 MCG tablet Take 88 mcg by mouth daily before breakfast.   06/30/2021  ? montelukast (SINGULAIR) 10 MG tablet Take 1 tablet (10 mg total) by mouth at bedtime. 30 tablet 0 06/30/2021  ? Multiple Vitamin (MULTIVITAMIN WITH MINERALS) TABS tablet Take 1 tablet by mouth daily.   06/30/2021  ? omeprazole (  PRILOSEC) 40 MG capsule Take 1 capsule (40 mg total) by mouth daily. 30 capsule 0 06/30/2021  ? Phenazopyridine HCl (URISTAT PO) Take 1 tablet by mouth as needed (UTI).   Past Week  ? pravastatin (PRAVACHOL) 40 MG tablet Take 1 tablet (40 mg total) by mouth daily at 6 PM. 30 tablet 0 06/30/2021  ? pregabalin (LYRICA) 50 MG capsule TAKE 1 CAPSULE BY MOUTH 2 TIMES DAILY. (Patient taking differently: Take 50 mg by mouth 2 (two) times daily.) 60 capsule 0 06/30/2021  ? Rivaroxaban (XARELTO) 15 MG TABS tablet Take 1 tablet (15 mg total) by mouth daily. 90 tablet 0 06/30/2021 at 0900  ? topiramate (TOPAMAX) 200 MG tablet Take 1 tablet (200 mg total) by mouth 2 (two) times daily. 60 tablet 0 06/30/2021  ? albuterol (PROVENTIL HFA) 108 (90 Base) MCG/ACT inhaler Inhale two puffs every 4-6 hours if needed for coughing, wheezing, or shortness of breath. (Patient not taking: Reported on 07/01/2021) 1 each 1 Not Taking  ? ANORO ELLIPTA 62.5-25 MCG/ACT AEPB TAKE 1 PUFF BY MOUTH EVERY DAY  (Patient not taking: Reported on 07/01/2021) 60 each 1 Not Taking  ? azelastine (ASTELIN) 0.1 % nasal spray Use one spray in each nostril twice daily (Patient not taking: Reported on 07/01/2021) 30 mL 5 Not Taking  ? butalbital-acetaminophen-caffeine (FIORICET) 50-325-40 MG tablet Take 1 tablet by mouth daily as needed for migraine. (Patient not taking: Reported on 07/01/2021) 5 tablet 0 Completed Course  ? famotidine (PEPCID) 20 MG tablet Take one tablet once daily (Patient not taking: Reported on 07/01/2021) 30 tablet 0 Not Taking  ? fluticasone (FLONASE) 50 MCG/ACT nasal spray SPRAY 1 SPRAY INTO BOTH NOSTRILS DAILY. (Patient not taking: Reported on 07/01/2021) 48 mL 2 Not Taking  ? ipratropium (ATROVENT) 0.03 % nasal spray SPRAY 2 SPRAYS INTO BOTH NOSTRILS EVERY 12 HOURS. (Patient not taking: Reported on 07/01/2021) 90 mL 3 Not Taking  ? lipase/protease/amylase (CREON) 12000-38000 units CPEP capsule Take 1 capsule (12,000 Units total) by mouth 3 (three) times daily before meals. (Patient not taking: Reported on 07/01/2021) 270 capsule 0 Not Taking  ? ? ?Scheduled:  ? aspirin EC  81 mg Oral Daily  ? budesonide  0.5 mg Nebulization BID  ? diazepam  2 mg Oral Once  ? famotidine  20 mg Oral Daily  ? levothyroxine  88 mcg Oral QAC breakfast  ? mouth rinse  15 mL Mouth Rinse BID  ? montelukast  10 mg Oral QHS  ? pantoprazole  40 mg Oral Daily  ? pravastatin  40 mg Oral q1800  ? pregabalin  50 mg Oral BID  ? sodium chloride flush  3 mL Intravenous Q12H  ? topiramate  200 mg Oral BID  ? ?Infusions:  ? ceFEPime (MAXIPIME) IV    ? heparin 1,200 Units/hr (07/01/21 2216)  ? lactated ringers 100 mL/hr at 07/01/21 2354  ? ?PRN: acetaminophen **OR** acetaminophen, morphine injection, oxyCODONE ? ?Assessment: ?49 yof with a history of afib on Xarelto; HF; COPD; DM; HLD; OSA; CVA; dementia; hypothyroidism; and seizures. Admission 1/9-15 for AIS & septic shock from pyelo/L renal calculus s/p L ureteral stenting. Patient is presenting  with fever.  ? ?Patient is on xarelto prior to arrival. Decision made to hold pending urology plan for stent. Last dose 3/10 0900.  ? ?Per Dr. Eliseo Squires, as no urology procedure is currently planned, it is OK to transition from heparin gtt to [place]. Hgb 11.3>9.8, PLT 196>149. No signs of bleeding noted per RN.  ? ?  Goal of Therapy:  ?Monitor platelets by anticoagulation protocol: Yes ?  ?Plan:  ?- STOP heparin gtt ?- START Xarelto 15 mg PO with supper (CrCl 15-50 mL/min)- administer first dose of xarelto at same time heparin gtt is stopped (advised RN it may be administered with milk or snack for first dose to improve absorption since not lining up with supper) ?- Monitor CBC daily  ? ?Adria Dill, PharmD ?PGY-1 Acute Care Resident  ?07/02/2021 2:11 PM  ? ? ?

## 2021-07-02 NOTE — Progress Notes (Signed)
Pharmacy Antibiotic Note ? ?Jenny Kelly is a 76 y.o. female admitted on 07/01/2021 with UTI. Admission 1/9-15 for AIS & septic shock 2/2 E. Coli bacteremia from pyelo/L renal calculus s/p L ureteral stenting. Started on levofloxacin as an outpatient 3/10. Pharmacy has been consulted for Cefepime dosing. Per consult, patient should have 7d DOT.  ? ?Scr 1.49>>1.35 (3/12). WBC 11.7. Tm 100.2.  ? ?Plan: ?Cefepime 2g IV q24h>>2g IV Q12H  ?Trend WBC, temp, renal function  ?F/U infectious work-up ? ?Temp (24hrs), Avg:99.3 ?F (37.4 ?C), Min:98.2 ?F (36.8 ?C), Max:100.2 ?F (37.9 ?C) ? ?Recent Labs  ?Lab 07/01/21 ?2536 07/01/21 ?6440 07/02/21 ?0111  ?WBC 13.6*  --  11.7*  ?CREATININE 1.49*  --  1.35*  ?LATICACIDVEN  --  1.1  --   ? ?  ?Estimated Creatinine Clearance: 32.6 mL/min (A) (by C-G formula based on SCr of 1.35 mg/dL (H)).   ? ?Allergies  ?Allergen Reactions  ? Ciprofloxacin Hives  ?  Ask patient  ? Prochlorperazine Edisylate Other (See Comments)  ?  Cant swallow ?Muscle cramping  ? Prednisone Other (See Comments)  ?  unknown  ? Reduced Iso-Alpha Acids Complex Other (See Comments)  ?  unknown  ? Statins Other (See Comments)  ?  Muscle weakness ?  ? Sulfa Antibiotics Swelling  ? Azithromycin Rash  ? Latex Rash  ? ? ?Microbiology results: ?3/11 Ucx: 30, 000 GNRs  ? ?Antimicrobials this admission: ?Cefepime 3/11>> ? ?Jani Gravel, PharmD ?PGY-1 Acute Care Resident  ?07/02/2021 7:50 AM  ? ? ? ?

## 2021-07-02 NOTE — Progress Notes (Signed)
PROGRESS NOTE    Jenny Kelly  [id number] DOB: 1945-09-03 DOA: 07/01/2021 PCP: Townsend Roger, MD    Brief Narrative:  Jenny Kelly is a 76 y.o. female with medical history significant of afib; chronic diastolic CHF (grade 2 on echo on 05/02/21); COPD; DM; HLD; OSA; CVA; dementia; hypothyroidism; and seizures presenting with fever.   She was seen at [facility] yesterday for UTI and discharged with PO antibiotics.  She was last admitted here from 1/9-15 (and then to [facility]) for acute ischemic stroke in conjunction with septic shock from pyelo/L renal calculus s/p L ureteral stenting.  She reports that she has had pain in her "punanny" (vagina) since the stent was placed.  She had worsening pain the past few days and so went to [facility] overnight yesterday.  She was not febrile and an evaluation showed UTI so she was given oral abx and pain meds and discharged.  The pain was intolerable overnight and she developed fever and so they came here.  Her pain is still severe in her vagina.  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  Pain is currently her biggest concern.    Assessment and Plan: * Sepsis secondary to UTI ([place]) -SIRS criteria in this patient includes: Leukocytosis, tachycardia, tachypnea  -Patient has no current evidence of acute organ failure -Suspected source is UTI; she has a known nonobstructing stone with recent stent placement -1/9 E coli UTI, sensitive to Cefepime -Blood and urine cultures pending -Treat with IV Cefepime for presumed complicated urinary source   S/P ureteral stent placement -Ureteral stent during last hospitalization -At that time, she had pyelonephritis and had septic shock requiring pressors in the ICU -She reports persistent pain in her vagina since stent placement -Urology is consulting -pain: will try tab of valium in her vagina x 1 to see if it works  DNR (do not resuscitate) -admitting MD discussed  code status with the patient and her husband and they are in agreement that the patient would not desire resuscitation and would prefer to die a natural death should that situation arise.   Prolonged QT interval -Will attempt to avoid QT-prolonging medications such as PPI, nausea meds, SSRIs   COPD (chronic obstructive pulmonary disease) (HCC) -On 2L [place] O2 -Continue Pulmicort, Singulair -Hold Anoro, Albuterol due to prolonged QTc  Seizures (HCC) -Continue Topamax, Lyrica  Sleep apnea -Continue CPAP  Pulmonary fibrosis (HCC) -Noted on imaging today -Continue Pulmicort, Singulair -Hold Anoro, Albuterol due to prolonged QTc  Right middle cerebral artery stroke (HCC) -Acute CVA during last hospitalization -She has residual primarily LUE hemiparesis -Continue ASA -PT/OT  Memory deficit -Mild cognitive impairment -Hold Aricept due to prolonged QTc  PAF (paroxysmal atrial fibrillation) (HCC) -She does not appear to be taking rate-controlling medications -resume xarelto as no procedure is planned at this point  Coronary artery disease involving native coronary artery of native heart -s/p stent -Continue ASA  Hyperlipidemia -Continue Pravachol  Hypothyroidism -Continue Synthroid  CKD (chronic kidney disease), stage III (HCC) -Stage 3b CKD -Appears to be stable for now          DVT prophylaxis:     Code Status: DNR Family Communication: at bedside  Disposition Plan:  Level of care: Telemetry Medical Status is: Inpatient Remains inpatient appropriate because: needs IV abx    Consultants:  urology   Subjective: No SOB, no CP Pain in her vagina  Objective: Vitals:   07/01/21  2110 07/02/21 0105 07/02/21 0519 07/02/21 0838  BP: (!) 122/52 (!) 118/49 (!) 141/72   Pulse: 80 79 100   Resp: 18 18 18    Temp: 98.2 F (36.8 C) 99.1 F (37.3 C) 100.2 F (37.9 C)   TempSrc: Oral Oral Oral   SpO2: 100% 99% 98% 94%  Weight:      Height:         Intake/Output Summary (Last 24 hours) at 07/02/2021 1328 Last data filed at 07/02/2021 1200 Gross per 24 hour  Intake 2407.97 ml  Output 900 ml  Net 1507.97 ml   Filed Weights   07/01/21 1737  Weight: 68.2 kg    Examination:   General: Appearance:     Overweight female in no acute distress     Lungs:     respirations unlabored  Heart:    Tachycardic.    MS:   All extremities are intact.    Neurologic:   Awake, alert       Data Reviewed: I have personally reviewed following labs and imaging studies  CBC: Recent Labs  Lab 07/01/21 0335 07/02/21 0111  WBC 13.6* 11.7*  HGB 11.3* 9.8*  HCT 37.8 32.3*  MCV 92.9 90.7  PLT 196 123456*   Basic Metabolic Panel: Recent Labs  Lab 07/01/21 0335 07/02/21 0111  NA 143 140  K 4.2 3.5  CL 115* 112*  CO2 21* 22  GLUCOSE 139* 117*  BUN 22 19  CREATININE 1.49* 1.35*  CALCIUM 8.3* 7.7*   GFR: Estimated Creatinine Clearance: 32.6 mL/min (A) (by C-G formula based on SCr of 1.35 mg/dL (H)). Liver Function Tests: Recent Labs  Lab 07/01/21 0335  AST 14*  ALT 6  ALKPHOS 71  BILITOT 0.1*  PROT 6.5  ALBUMIN 2.5*   No results for input(s): LIPASE, AMYLASE in the last 168 hours. No results for input(s): AMMONIA in the last 168 hours. Coagulation Profile: Recent Labs  Lab 07/01/21 0335  INR 1.4*   Cardiac Enzymes: No results for input(s): CKTOTAL, CKMB, CKMBINDEX, TROPONINI in the last 168 hours. BNP (last 3 results) No results for input(s): PROBNP in the last 8760 hours. HbA1C: No results for input(s): HGBA1C in the last 72 hours. CBG: No results for input(s): GLUCAP in the last 168 hours. Lipid Profile: No results for input(s): CHOL, HDL, LDLCALC, TRIG, CHOLHDL, LDLDIRECT in the last 72 hours. Thyroid Function Tests: No results for input(s): TSH, T4TOTAL, FREET4, T3FREE, THYROIDAB in the last 72 hours. Anemia Panel: No results for input(s): VITAMINB12, FOLATE, FERRITIN, TIBC, IRON, RETICCTPCT in the last 72  hours. Sepsis Labs: Recent Labs  Lab 07/01/21 0347 07/01/21 1228  PROCALCITON  --  0.16  LATICACIDVEN 1.1  --     Recent Results (from the past 240 hour(s))  Blood Culture (routine x 2)     Status: None (Preliminary result)   Collection Time: 07/01/21  3:28 AM   Specimen: BLOOD  Result Value Ref Range Status   Specimen Description BLOOD RIGHT ANTECUBITAL  Final   Special Requests   Final    BOTTLES DRAWN AEROBIC AND ANAEROBIC Blood Culture adequate volume   Culture   Final    NO GROWTH 1 DAY Performed at [facility], [place] [address] [place], [place] [address]    Report Status PENDING  Incomplete  Urine Culture     Status: Abnormal (Preliminary result)   Collection Time: 07/01/21  3:47 AM   Specimen: In/Out Cath Urine  Result Value Ref Range Status  Specimen Description IN/OUT CATH URINE  Final   Special Requests NONE  Final   Culture (A)  Final    30,000 COLONIES/mL ESCHERICHIA COLI SUSCEPTIBILITIES TO FOLLOW Performed at [facility], [address], [place], [place] [address]    Report Status PENDING  Incomplete  Blood Culture (routine x 2)     Status: None (Preliminary result)   Collection Time: 07/01/21  4:58 AM   Specimen: BLOOD RIGHT ARM  Result Value Ref Range Status   Specimen Description BLOOD RIGHT ARM  Final   Special Requests   Final    BOTTLES DRAWN AEROBIC AND ANAEROBIC Blood Culture adequate volume   Culture   Final    NO GROWTH 1 DAY Performed at [facility], [place] [address] [place], [place] [address]    Report Status PENDING  Incomplete  Resp Panel by RT-PCR (Flu A&B, Covid) Nasopharyngeal Swab     Status: None   Collection Time: 07/01/21  6:29 AM   Specimen: Nasopharyngeal Swab; Nasopharyngeal(NP) swabs in vial transport medium  Result Value Ref Range Status   SARS Coronavirus 2 by RT PCR NEGATIVE NEGATIVE Final    Comment: (NOTE) SARS-CoV-2 target nucleic acids are NOT DETECTED.  The SARS-CoV-2 RNA is generally detectable  in upper respiratory specimens during the acute phase of infection. The lowest concentration of SARS-CoV-2 viral copies this assay can detect is 138 copies/mL. A negative result does not preclude SARS-Cov-2 infection and should not be used as the sole basis for treatment or other patient management decisions. A negative result may occur with  improper specimen collection/handling, submission of specimen other than nasopharyngeal swab, presence of viral mutation(s) within the areas targeted by this assay, and inadequate number of viral copies(<138 copies/mL). A negative result must be combined with clinical observations, patient history, and epidemiological information. The expected result is Negative.  Fact Sheet for Patients:  EntrepreneurPulse.com.au  Fact Sheet for Healthcare Providers:  IncredibleEmployment.be  This test is no t yet approved or cleared by the [place] FDA and  has been authorized for detection and/or diagnosis of SARS-CoV-2 by FDA under an Emergency Use Authorization (EUA). This EUA will remain  in effect (meaning this test can be used) for the duration of the COVID-19 declaration under Section 564(b)(1) of the Act, 21 U.S.C.section 360bbb-3(b)(1), unless the authorization is terminated  or revoked sooner.       Influenza A by PCR NEGATIVE NEGATIVE Final   Influenza B by PCR NEGATIVE NEGATIVE Final    Comment: (NOTE) The Xpert Xpress SARS-CoV-2/FLU/RSV plus assay is intended as an aid in the diagnosis of influenza from Nasopharyngeal swab specimens and should not be used as a sole basis for treatment. Nasal washings and aspirates are unacceptable for Xpert Xpress SARS-CoV-2/FLU/RSV testing.  Fact Sheet for Patients: EntrepreneurPulse.com.au  Fact Sheet for Healthcare Providers: IncredibleEmployment.be  This test is not yet approved or cleared by the [place] FDA and has  been authorized for detection and/or diagnosis of SARS-CoV-2 by FDA under an Emergency Use Authorization (EUA). This EUA will remain in effect (meaning this test can be used) for the duration of the COVID-19 declaration under Section 564(b)(1) of the Act, 21 U.S.C. section 360bbb-3(b)(1), unless the authorization is terminated or revoked.  Performed at [facility], [place] [address], [place], [place] [address]          Radiology Studies: DG Chest Port 1 View  Result Date: 07/01/2021 CLINICAL DATA:  Questionable sepsis. EXAM: PORTABLE  CHEST 1 VIEW COMPARISON:  05/14/2021 FINDINGS: Postoperative changes in the mediastinum. Heart size and pulmonary vascularity are normal. Diffuse interstitial changes throughout the lungs, similar to prior study. This is likely chronic fibrosis. No focal consolidation. No pleural effusions. No pneumothorax. Mediastinal contours appear intact. Calcification of the aorta. Degenerative changes in the spine and shoulders. Surgical clips in the right upper quadrant. IMPRESSION: Chronic interstitial pattern to the lungs without change. No evidence of active pulmonary disease. Electronically Signed   By: Lucienne Capers M.D.   On: 07/01/2021 04:02   CT Renal Stone Study  Result Date: 07/01/2021 CLINICAL DATA:  76 year old female with history of urinary tract infection. Possible nephrolithiasis. EXAM: CT ABDOMEN AND PELVIS WITHOUT CONTRAST TECHNIQUE: Multidetector CT imaging of the abdomen and pelvis was performed following the standard protocol without IV contrast. RADIATION DOSE REDUCTION: This exam was performed according to the departmental dose-optimization program which includes automated exposure control, adjustment of the mA and/or kV according to patient size and/or use of iterative reconstruction technique. COMPARISON:  CT of the abdomen and pelvis 06/01/2021. FINDINGS: Lower chest: Extensive areas of cylindrical bronchiectasis, thickening of the  peribronchovascular interstitium and regional architectural distortion and volume loss are again noted in the lung bases, most severe in the lower lobes, likely progressive post infectious or inflammatory scarring, although the possibility of active infection or sequela of recent aspiration is not excluded. Atherosclerotic calcifications in the thoracic aorta as well as the left main, left anterior descending, left circumflex and right coronary arteries. Hepatobiliary: No definite suspicious cystic or solid hepatic lesions are confidently identified on today's noncontrast CT examination. A few scattered tiny calcified granulomas are noted throughout the liver. Status post cholecystectomy. Pancreas: Numerous coarse calcifications are noted throughout the pancreas, along with diffuse pancreatic atrophy, which is likely sequela of chronic pancreatitis. No discrete pancreatic mass confidently identified on today's noncontrast CT examination. No peripancreatic fluid collections or inflammatory changes. Spleen: Numerous calcified granulomas throughout the spleen. Small splenule medial to the spleen. Adrenals/Urinary Tract: In the lower pole collecting system of the left kidney there is a 9 mm nonobstructive calculus. Left-sided double-J ureteral stent appears appropriately located with the proximal loop reformed in the left renal pelvis and distal loop reformed in the urinary bladder. No additional calculi are confidently identified within the right renal collecting system, along the course of either ureter, or within the lumen of the urinary bladder. There is mild fullness in the left renal pelvis, but no frank hydroureteronephrosis. Unenhanced appearance of the right kidney, bilateral adrenal glands and urinary bladder is unremarkable. Stomach/Bowel: Unenhanced appearance of the stomach is normal. There is no pathologic dilatation of small bowel or colon. The appendix is not confidently identified and may be surgically  absent. Regardless, there are no inflammatory changes noted adjacent to the cecum to suggest the presence of an acute appendicitis at this time. Vascular/Lymphatic: Aortic atherosclerosis. 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 No lymphadenopathy noted in the abdomen or pelvis. Reproductive: Status post hysterectomy. Ovaries are not confidently identified may be surgically absent or atrophic. Other: No significant volume of ascites.  No pneumoperitoneum. Musculoskeletal: There are no aggressive appearing lytic or blastic lesions noted in the visualized portions of the skeleton. IMPRESSION: 1. 9 mm nonobstructive calculus in the lower pole collecting system of the left kidney. Left-sided double-J ureteral stent remains appropriately located. There is some very mild fullness in the left renal pelvis, without frank left hydroureteronephrosis to suggest  substantial stent malfunction. 2. The appearance of the lung bases suggests substantial chronic post infectious or inflammatory scarring, although active multilobar bronchopneumonia or aspiration pneumonitis is not excluded. Further clinical evaluation is recommended. 3. Aortic atherosclerosis, in addition to left main and three-vessel coronary artery disease. 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 4. Additional incidental findings, as above. Electronically Signed   By: Vinnie Langton M.D.   On: 07/01/2021 05:20        Scheduled Meds:  aspirin EC  81 mg Oral Daily   budesonide  0.5 mg Nebulization BID   famotidine  20 mg Oral Daily   levothyroxine  88 mcg Oral QAC breakfast   mouth rinse  15 mL Mouth Rinse BID   montelukast  10 mg Oral QHS   pantoprazole  40 mg Oral Daily   pravastatin  40 mg Oral q1800   pregabalin  50 mg Oral BID   sodium chloride flush  3 mL Intravenous Q12H   topiramate  200 mg  Oral BID   Continuous Infusions:  ceFEPime (MAXIPIME) IV     heparin 1,200 Units/hr (07/01/21 2216)   lactated ringers 100 mL/hr at 07/01/21 2354     LOS: 1 day    Time spent: 55 minutes spent on chart review, discussion with nursing staff, consultants, updating family and interview/physical exam; more than 50% of that time was spent in counseling and/or coordination of care.    Geradine Girt, DO [facility] Available via Epic secure chat 7am-7pm After these hours, please refer to coverage provider listed on [email or web address] 07/02/2021, 1:28 PM

## 2021-07-02 NOTE — Plan of Care (Signed)

## 2021-07-03 DIAGNOSIS — N390 Urinary tract infection, site not specified: Secondary | ICD-10-CM | POA: Diagnosis not present

## 2021-07-03 DIAGNOSIS — A419 Sepsis, unspecified organism: Secondary | ICD-10-CM | POA: Diagnosis not present

## 2021-07-03 LAB — URINE CULTURE: Culture: 30000 — AB

## 2021-07-03 LAB — CBC
HCT: 40 % (ref 36.0–46.0)
Hemoglobin: 11.8 g/dL — ABNORMAL LOW (ref 12.0–15.0)
MCH: 27 pg (ref 26.0–34.0)
MCHC: 29.5 g/dL — ABNORMAL LOW (ref 30.0–36.0)
MCV: 91.5 fL (ref 80.0–100.0)
Platelets: 116 10*3/uL — ABNORMAL LOW (ref 150–400)
RBC: 4.37 MIL/uL (ref 3.87–5.11)
RDW: 16.6 % — ABNORMAL HIGH (ref 11.5–15.5)
WBC: 9.5 10*3/uL (ref 4.0–10.5)
nRBC: 0 % (ref 0.0–0.2)

## 2021-07-03 MED ORDER — OXYCODONE HCL 5 MG PO TABS
2.5000 mg | ORAL_TABLET | ORAL | Status: DC | PRN
  Administered 2021-07-03 – 2021-07-04 (×3): 5 mg via ORAL
  Filled 2021-07-03 (×3): qty 1

## 2021-07-03 MED ORDER — OXYBUTYNIN CHLORIDE 5 MG PO TABS
2.5000 mg | ORAL_TABLET | Freq: Three times a day (TID) | ORAL | Status: DC
  Administered 2021-07-03 – 2021-07-07 (×12): 2.5 mg via ORAL
  Filled 2021-07-03 (×12): qty 1

## 2021-07-03 NOTE — Progress Notes (Signed)
PT Cancellation Note ? ?Patient Details ?Name: Jenny Kelly ?MRN: [id number] ?DOB: [birth year] ? ? ?Cancelled Treatment:    Reason Eval/Treat Not Completed: Other (comment). Pt currently eating lunch. Will return shortly. ? ? ?Shary Decamp [facility] ?07/03/2021, 12:08 PM ?Suanne Marker PT ?Acute Rehabilitation Services ?Pager [phone] ?Office [phone] ? ?

## 2021-07-03 NOTE — Progress Notes (Signed)
PROGRESS NOTE    Jenny Kelly  [id number] DOB: 1945/11/21 DOA: 07/01/2021 PCP: Townsend Roger, MD    Brief Narrative:  Jenny Kelly is a 76 y.o. female with medical history significant of afib; chronic diastolic CHF (grade 2 on echo on 05/02/21); COPD; DM; HLD; OSA; CVA; dementia; hypothyroidism; and seizures presenting with fever.   She was seen at [facility] yesterday for UTI and discharged with PO antibiotics.  She was last admitted here from 1/9-15 (and then to CIR) for acute ischemic stroke in conjunction with septic shock from pyelo/L renal calculus s/p L ureteral stenting.  She reports that she has had pain in her "punanny" (vagina) since the stent was placed.  She had worsening pain the past few days and so went to [facility] overnight yesterday.  She was not febrile and an evaluation showed UTI so she was given oral abx and pain meds and discharged.  The pain was intolerable overnight and she developed fever and so they came here.  Her pain is still severe in her vagina.  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  Pain is currently her biggest concern- was not responsive to valium.      Assessment and Plan: * Sepsis secondary to UTI ([place]) -SIRS criteria in this patient includes: Leukocytosis, tachycardia, tachypnea  -Patient has no current evidence of acute organ failure -Suspected source is UTI; she has a known nonobstructing stone with recent stent placement -1/9 E coli UTI, sensitive to Cefepime -Blood and urine cultures pending -Treat with IV Cefepime for presumed complicated urinary source   S/P ureteral stent placement -Ureteral stent during last hospitalization -At that time, she had pyelonephritis and had septic shock requiring pressors in the ICU -She reports persistent pain in her vagina since stent placement -Urology is consulting -pain: valium did not help per patient, check PVR  DNR (do not resuscitate) -admitting  MD discussed code status with the patient and her husband and they are in agreement that the patient would not desire resuscitation and would prefer to die a natural death should that situation arise.   Prolonged QT interval -Will attempt to avoid QT-prolonging medications such as PPI, nausea meds, SSRIs   COPD (chronic obstructive pulmonary disease) (HCC) -On 2L [place] O2 -Continue Pulmicort, Singulair -Hold Anoro, Albuterol due to prolonged QTc  Seizures (HCC) -Continue Topamax, Lyrica  Sleep apnea -Continue CPAP  Pulmonary fibrosis (HCC) -Noted on imaging today -Continue Pulmicort, Singulair -Hold Anoro, Albuterol due to prolonged QTc  Right middle cerebral artery stroke (HCC) -Acute CVA during last hospitalization -She has residual primarily LUE hemiparesis -Continue ASA -PT/OT  Memory deficit -Mild cognitive impairment -Hold Aricept due to prolonged QTc  PAF (paroxysmal atrial fibrillation) (HCC) -She does not appear to be taking rate-controlling medications -resume xarelto as no procedure is planned at this point  Coronary artery disease involving native coronary artery of native heart -s/p stent -Continue ASA  Hyperlipidemia -Continue Pravachol  Hypothyroidism -Continue Synthroid  CKD (chronic kidney disease), stage III (HCC) -Stage 3b CKD -Appears to be stable for now          DVT prophylaxis:  Rivaroxaban (XARELTO) tablet 15 mg    Code Status: DNR Family Communication: at bedside  Disposition Plan:  Level of care: Med-Surg Status is: Inpatient Remains inpatient appropriate because: needs IV abx    Consultants:  urology   Subjective: Valium did not help with stent pain  Objective: Vitals:  07/02/21 2008 07/02/21 2205 07/03/21 0827 07/03/21 0852  BP: (!) 98/48   (!) 108/52  Pulse: 69   70  Resp: 18   18  Temp: 98.4 F (36.9 C)   98 F (36.7 C)  TempSrc:      SpO2: 96% 100% 92% 99%  Weight:      Height:         Intake/Output Summary (Last 24 hours) at 07/03/2021 1210 Last data filed at 07/03/2021 1018 Gross per 24 hour  Intake 1117.62 ml  Output 800 ml  Net 317.62 ml   Filed Weights   07/01/21 1737  Weight: 68.2 kg    Examination:   General: Appearance:     Overweight female in no acute distress     Lungs:     respirations unlabored  Heart:    Normal heart rate.    MS:   All extremities are intact.    Neurologic:   Awake, alert       Data Reviewed: I have personally reviewed following labs and imaging studies  CBC: Recent Labs  Lab 07/01/21 0335 07/02/21 0111 07/03/21 0646  WBC 13.6* 11.7* 9.5  HGB 11.3* 9.8* 11.8*  HCT 37.8 32.3* 40.0  MCV 92.9 90.7 91.5  PLT 196 149* [id number]*   Basic Metabolic Panel: Recent Labs  Lab 07/01/21 0335 07/02/21 0111  NA 143 140  K 4.2 3.5  CL 115* 112*  CO2 21* 22  GLUCOSE 139* 117*  BUN 22 19  CREATININE 1.49* 1.35*  CALCIUM 8.3* 7.7*   GFR: Estimated Creatinine Clearance: 32.6 mL/min (A) (by C-G formula based on SCr of 1.35 mg/dL (H)). Liver Function Tests: Recent Labs  Lab 07/01/21 0335  AST 14*  ALT 6  ALKPHOS 71  BILITOT 0.1*  PROT 6.5  ALBUMIN 2.5*   No results for input(s): LIPASE, AMYLASE in the last 168 hours. No results for input(s): AMMONIA in the last 168 hours. Coagulation Profile: Recent Labs  Lab 07/01/21 0335  INR 1.4*   Cardiac Enzymes: No results for input(s): CKTOTAL, CKMB, CKMBINDEX, TROPONINI in the last 168 hours. BNP (last 3 results) No results for input(s): PROBNP in the last 8760 hours. HbA1C: No results for input(s): HGBA1C in the last 72 hours. CBG: No results for input(s): GLUCAP in the last 168 hours. Lipid Profile: No results for input(s): CHOL, HDL, LDLCALC, TRIG, CHOLHDL, LDLDIRECT in the last 72 hours. Thyroid Function Tests: No results for input(s): TSH, T4TOTAL, FREET4, T3FREE, THYROIDAB in the last 72 hours. Anemia Panel: No results for input(s): VITAMINB12, FOLATE,  FERRITIN, TIBC, IRON, RETICCTPCT in the last 72 hours. Sepsis Labs: Recent Labs  Lab 07/01/21 0347 07/01/21 1228  PROCALCITON  --  0.16  LATICACIDVEN 1.1  --     Recent Results (from the past 240 hour(s))  Blood Culture (routine x 2)     Status: None (Preliminary result)   Collection Time: 07/01/21  3:28 AM   Specimen: BLOOD  Result Value Ref Range Status   Specimen Description BLOOD RIGHT ANTECUBITAL  Final   Special Requests   Final    BOTTLES DRAWN AEROBIC AND ANAEROBIC Blood Culture adequate volume   Culture   Final    NO GROWTH 2 DAYS Performed at [facility], [address] [place], [place] [address]    Report Status PENDING  Incomplete  Urine Culture     Status: Abnormal   Collection Time: 07/01/21  3:47 AM   Specimen: In/Out Cath Urine  Result  Value Ref Range Status   Specimen Description IN/OUT CATH URINE  Final   Special Requests   Final    NONE Performed at [facility], [place] [address] [place], [place] [address]    Culture 30,000 COLONIES/mL ESCHERICHIA COLI (A)  Final   Report Status 07/03/2021 FINAL  Final   Organism ID, Bacteria ESCHERICHIA COLI (A)  Final      Susceptibility   Escherichia coli - MIC*    AMPICILLIN >=32 RESISTANT Resistant     CEFAZOLIN <=4 SENSITIVE Sensitive     CEFEPIME <=0.12 SENSITIVE Sensitive     CEFTRIAXONE <=0.25 SENSITIVE Sensitive     CIPROFLOXACIN 0.5 INTERMEDIATE Intermediate     GENTAMICIN <=1 SENSITIVE Sensitive     IMIPENEM <=0.25 SENSITIVE Sensitive     NITROFURANTOIN <=16 SENSITIVE Sensitive     TRIMETH/SULFA <=20 SENSITIVE Sensitive     AMPICILLIN/SULBACTAM 16 INTERMEDIATE Intermediate     PIP/TAZO <=4 SENSITIVE Sensitive     * 30,000 COLONIES/mL ESCHERICHIA COLI  Blood Culture (routine x 2)     Status: None (Preliminary result)   Collection Time: 07/01/21  4:58 AM   Specimen: BLOOD RIGHT ARM  Result Value Ref Range Status   Specimen Description BLOOD RIGHT ARM  Final   Special Requests   Final     BOTTLES DRAWN AEROBIC AND ANAEROBIC Blood Culture adequate volume   Culture   Final    NO GROWTH 2 DAYS Performed at [facility], [address], [place], [place] [address]    Report Status PENDING  Incomplete  Resp Panel by RT-PCR (Flu A&B, Covid) Nasopharyngeal Swab     Status: None   Collection Time: 07/01/21  6:29 AM   Specimen: Nasopharyngeal Swab; Nasopharyngeal(NP) swabs in vial transport medium  Result Value Ref Range Status   SARS Coronavirus 2 by RT PCR NEGATIVE NEGATIVE Final    Comment: (NOTE) SARS-CoV-2 target nucleic acids are NOT DETECTED.  The SARS-CoV-2 RNA is generally detectable in upper respiratory specimens during the acute phase of infection. The lowest concentration of SARS-CoV-2 viral copies this assay can detect is 138 copies/mL. A negative result does not preclude SARS-Cov-2 infection and should not be used as the sole basis for treatment or other patient management decisions. A negative result may occur with  improper specimen collection/handling, submission of specimen other than nasopharyngeal swab, presence of viral mutation(s) within the areas targeted by this assay, and inadequate number of viral copies(<138 copies/mL). A negative result must be combined with clinical observations, patient history, and epidemiological information. The expected result is Negative.  Fact Sheet for Patients:  EntrepreneurPulse.com.au  Fact Sheet for Healthcare Providers:  IncredibleEmployment.be  This test is no t yet approved or cleared by the [place] FDA and  has been authorized for detection and/or diagnosis of SARS-CoV-2 by FDA under an Emergency Use Authorization (EUA). This EUA will remain  in effect (meaning this test can be used) for the duration of the COVID-19 declaration under Section 564(b)(1) of the Act, 21 U.S.C.section 360bbb-3(b)(1), unless the authorization is terminated  or revoked sooner.        Influenza A by PCR NEGATIVE NEGATIVE Final   Influenza B by PCR NEGATIVE NEGATIVE Final    Comment: (NOTE) The Xpert Xpress SARS-CoV-2/FLU/RSV plus assay is intended as an aid in the diagnosis of influenza from Nasopharyngeal swab specimens and should not be used as a sole basis for treatment. Nasal washings and aspirates are unacceptable for Xpert Xpress SARS-CoV-2/FLU/RSV  testing.  Fact Sheet for Patients: EntrepreneurPulse.com.au  Fact Sheet for Healthcare Providers: IncredibleEmployment.be  This test is not yet approved or cleared by the [place] FDA and has been authorized for detection and/or diagnosis of SARS-CoV-2 by FDA under an Emergency Use Authorization (EUA). This EUA will remain in effect (meaning this test can be used) for the duration of the COVID-19 declaration under Section 564(b)(1) of the Act, 21 U.S.C. section 360bbb-3(b)(1), unless the authorization is terminated or revoked.  Performed at [facility], [place] [address], [place], [place] [address]          Radiology Studies: No results found.      Scheduled Meds:  aspirin EC  81 mg Oral Daily   budesonide  0.5 mg Nebulization BID   famotidine  20 mg Oral Daily   levothyroxine  88 mcg Oral QAC breakfast   mouth rinse  15 mL Mouth Rinse BID   montelukast  10 mg Oral QHS   pantoprazole  40 mg Oral Daily   pravastatin  40 mg Oral q1800   pregabalin  50 mg Oral BID   rivaroxaban  15 mg Oral Q supper   sodium chloride flush  3 mL Intravenous Q12H   topiramate  200 mg Oral BID   Continuous Infusions:  ceFEPime (MAXIPIME) IV 2 g (07/03/21 0513)     LOS: 2 days    Time spent: 55 minutes spent on chart review, discussion with nursing staff, consultants, updating family and interview/physical exam; more than 50% of that time was spent in counseling and/or coordination of care.    Geradine Girt, DO [facility] Available via Epic secure chat  7am-7pm After these hours, please refer to coverage provider listed on [email or web address] 07/03/2021, 12:10 PM

## 2021-07-03 NOTE — Progress Notes (Signed)
Patient refused CPAP for tonight. Patient stated she does not wear one at home and does not wish to wear one tonight. RT will monitor as needed. ?

## 2021-07-03 NOTE — TOC Initial Note (Signed)
Transition of Care (TOC) - Initial/Assessment Note  ? ? ?Patient Details  ?Name: Jenny Kelly ?MRN: [id number] ?Date of Birth: April 23, 1946 ? ?Transition of Care (TOC) CM/SW Contact:    ?Jenny Kelly ?Phone Number: ?07/03/2021, 4:06 PM ? ?Clinical Narrative:                 ? ?CM spoke with patient and husband, Jenny Kelly at bedside about needs for post hospital transition. Jenny Kelly spoke most of the time as patient is Scientist, forensic. Admitted for Sepsis 2/2 UTI. Has three supportive children. Has a cane, walker, w/c, bsc, shower seat at home. PCP is Jenny Kelly [facility] [address]  ?Patient had used [facility] care disciplines before and Jenny Kelly requested to continue with their services. New referral called in to [facility] with acceptance voiced. Information on AVS.  ?CM will continue to follow with needs. ? ?Expected Discharge Plan: [place] ?Barriers to Discharge: Continued Medical Work up ? ? ?Patient Goals and CMS Choice ?Patient states their goals for this hospitalization and ongoing recovery are:: To return home with husband ?CMS Medicare.gov Compare Post Acute Care list provided to:: Patient ?Choice offered to / list presented to : Patient, Spouse ? ?Expected Discharge Plan and Services ?Expected Discharge Plan: [place] ?  ?Discharge Planning Services: CM Consult ?Post Acute Care Choice: Home Health ?Living arrangements for the past 2 months: [place] ?                ?DME Arranged: N/A ?DME Agency: NA ?  ?  ?  ?HH Arranged: PT, OT ?Assumption Agency: [place] ?Date HH Agency Contacted: 07/03/21 ?Time [place]: [id number] spoke with at [place]: Jenny Kelly ? ?Prior Living Arrangements/Services ?Living arrangements for the past 2 months: [place] ?Lives with:: Spouse ?Patient language and need for interpreter reviewed:: Yes ?Do you feel safe going back to the place where you live?: Yes      ?Need  for Family Participation in Patient Care: Yes (Comment) ?Care giver support system in place?: Yes (comment) ?Current home services: DME (Cane, walker, w/c, bsc, shower seat.) ?Criminal Activity/Legal Involvement Pertinent to Current Situation/Hospitalization: No - Comment as needed ? ?Activities of Daily Living ?Home Assistive Devices/Equipment: Cane (specify quad or straight), Eyeglasses, Hearing aid, Oxygen, Walker (specify type) (rollator) ?ADL Screening (condition at time of admission) ?Patient's cognitive ability adequate to safely complete daily activities?: Yes ?Is the patient deaf or have difficulty hearing?: Yes ?Does the patient have difficulty seeing, even when wearing glasses/contacts?: No ?Does the patient have difficulty concentrating, remembering, or making decisions?: No ?Patient able to express need for assistance with ADLs?: Yes ?Does the patient have difficulty dressing or bathing?: No ?Independently performs ADLs?: No ?Communication: Independent ?Dressing (OT): Needs assistance ?Is this a change from baseline?: Change from baseline, expected to last <3days ?Grooming: Needs assistance ?Is this a change from baseline?: Change from baseline, expected to last <3 days ?Feeding: Independent ?Bathing: Needs assistance ?Is this a change from baseline?: Change from baseline, expected to last <3 days ?Toileting: Needs assistance ?Is this a change from baseline?: Change from baseline, expected to last <3 days ?In/Out Bed: Needs assistance ?Is this a change from baseline?: Change from baseline, expected to last <3 days ?Walks in Home: Independent with device (comment) (rollator) ?Does the patient have difficulty walking or climbing stairs?: Yes ?Weakness of Legs: None ?Weakness of Arms/Hands: Left ? ?Permission  Sought/Granted ?Permission sought to share information with : Case Manager, Customer service manager, Family Supports ?Permission granted to share information with : Yes, Verbal Permission  Granted ? Share Information with NAME: Jenny Kelly ? Permission granted to share info w AGENCY: [facility] ?   ?   ? ?Emotional Assessment ?Appearance:: Appears stated age ?Attitude/Demeanor/Rapport: Engaged, Gracious ?Affect (typically observed): Accepting, Appropriate, Calm, Hopeful ?Orientation: : Oriented to Self, Oriented to Place, Oriented to Situation ?Alcohol / Substance Use: Not Applicable ?Psych Involvement: No (comment) ? ?Admission diagnosis:  Pyelonephritis [N12] ?Sepsis secondary to UTI ([place]) [A41.9, N39.0] ?Sepsis without acute organ dysfunction, due to unspecified organism [facility]) [A41.9] ?Patient Active Problem List  ? Diagnosis Date Noted  ? Sepsis secondary to UTI ([place]) 07/01/2021  ? Prolonged QT interval 07/01/2021  ? DNR (do not resuscitate) 07/01/2021  ? Sleep apnea 06/09/2021  ? Seizures ([place]) 06/09/2021  ? Neuromuscular disorder ([place]) 06/09/2021  ? Myocardial infarction ([place]) 06/09/2021  ? Heart murmur 06/09/2021  ? GERD (gastroesophageal reflux disease) 06/09/2021  ? Depression 06/09/2021  ? COPD (chronic obstructive pulmonary disease) ([place]) 06/09/2021  ? Clotting disorder ([place]) 06/09/2021  ? CHF (congestive heart failure) ([place]) 06/09/2021  ? Cataract 06/09/2021  ? Blood transfusion without reported diagnosis 06/09/2021  ? Asthma 06/09/2021  ? Arthritis 06/09/2021  ? Anxiety 06/09/2021  ? Anemia 06/09/2021  ? Allergy 06/09/2021  ? 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 E coli bacteremia 05/08/2021  ? Pulmonary fibrosis ([place]) 05/08/2021  ? S/P ureteral stent placement 05/08/2021  ? Cerebral infarction due to thrombosis of right middle cerebral artery ([place]) 05/07/2021  ? Bradycardia   ? Wide-complex tachycardia   ? Right middle cerebral artery stroke ([place]) 05/01/2021  ? Sepsis with acute hypoxic respiratory failure without septic shock ([place])   ? Hyperkalemia   ? AKI (acute kidney injury) ([place])   ? Polypharmacy 12/26/2017  ? Post-concussion headache 12/26/2017  ? Tremor 02/21/2016  ? Memory deficit  01/10/2016  ? Risk for falls 11/22/2015  ? CKD (chronic kidney disease), stage III ([place]) 06/21/2015  ? Hypothyroidism 06/21/2015  ? Hyperlipidemia 06/21/2015  ? PAF (paroxysmal atrial fibrillation) ([place]) 06/21/2015  ? Coronary artery disease involving native coronary artery of native heart 03/10/2014  ? 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 ?PCP:  Jenny Kelly ?Pharmacy:   ?[id number] [place], [place] [address] ?[place] [address] ?Phone: [phone] Fax: [phone] ? ? ? ? ?Social Determinants of Health (SDOH) Interventions ?  ? ?Readmission Risk Interventions ?No flowsheet data found. ? ? ?

## 2021-07-03 NOTE — Progress Notes (Signed)
PT Cancellation Note ? ?Patient Details ?Name: Jenny Kelly ?MRN: [id number] ?DOB: 09-Sep-1945 ? ? ?Cancelled Treatment:    Reason Eval/Treat Not Completed: Fatigue/lethargy limiting ability to participate. Have attempted 2 more times since this AM and pt lethargic after getting morphine earlier. Will continue attempts. ? ? ?Angelina Ok [facility] ?07/03/2021, 2:55 PM ?Skip Mayer PT ?Acute Rehabilitation Services ?Pager [phone] ?Office [phone] ? ?

## 2021-07-03 NOTE — Progress Notes (Signed)
Pt refusing CPAP for the night.  

## 2021-07-03 NOTE — Evaluation (Signed)
Physical Therapy Evaluation ?Patient Details ?Name: Jenny Kelly ?MRN: [id number] ?DOB: 1946/03/31 ?[name]: 07/03/2021 ? ?History of Present Illness ? This 76 y.o. female admitted 3/11 with vaginal pain and fever.  Dx:  sepsis secondary to UTI.  PMH includes:  recent ureteral stent placment, COPD, seizures, sleep apnea, pulmonary fibrosis, recent Rt MCA CVA, h/o mild cognitive impairment, PAF, CAD, CKD stage III  ?Clinical Impression ? Saw pt for eval and attempted to increase her alertness by bringing her to EOB. Was hoping this would wake her up enough to mobilize. Unfortunately pt didn't assist at all and woke up only briefly on EOB and was unable to do anything from there. Will defer dc recommendations until we can see her when she is more alert. Hopeful for return home with husband since she was able to get up with OT yesterday.    ?   ? ?Recommendations for follow up therapy are one component of a multi-disciplinary discharge planning process, led by the attending physician.  Recommendations may be updated based on patient status, additional functional criteria and insurance authorization. ? ?Follow Up Recommendations Other (comment) (TBD after more awake) ? ?  ?Assistance Recommended at Discharge Frequent or constant Supervision/Assistance  ?Patient can return home with the following ?   ? ?  ?Equipment Recommendations    ?Recommendations for Other Services ?    ?  ?Functional Status Assessment Patient has had a recent decline in their functional status and demonstrates the ability to make significant improvements in function in a reasonable and predictable amount of time.  ? ?  ?Precautions / Restrictions Precautions ?Precautions: Fall  ? ?  ? ?Mobility ? Bed Mobility ?Overal bed mobility: Needs Assistance ?Bed Mobility: Rolling, Sidelying to Sit, Sit to Supine ?Rolling: Total assist ?Sidelying to sit: Total assist ?  ?Sit to supine: Total assist ?  ?General bed mobility comments: Assist for all aspects  due to lethargy ?  ? ?Transfers ?  ?  ?  ?  ?  ?  ?  ?  ?  ?General transfer comment: Unable to attempt due to lethargy ?  ? ?Ambulation/Gait ?  ?  ?  ?  ?  ?  ?  ?  ? ?Stairs ?  ?  ?  ?  ?  ? ?Wheelchair Mobility ?  ? ?Modified Rankin (Stroke Patients Only) ?  ? ?  ? ?Balance Overall balance assessment: Needs assistance ?Sitting-balance support: Feet supported ?Sitting balance-Leahy Scale: Poor ?Sitting balance - Comments: lethargic ?  ?  ?  ?  ?  ?  ?  ?  ?  ?  ?  ?  ?  ?  ?  ?   ? ? ? ?Pertinent Vitals/Pain Pain Assessment ?Pain Assessment: Faces ?Faces Pain Scale: Hurts little more ?Pain Location: back ?Pain Descriptors / Indicators: Grimacing, Guarding, Moaning ?Pain Intervention(s): Monitored during session, Repositioned  ? ? ?Home Living Family/patient expects to be discharged to:: Private residence ?Living Arrangements: Spouse/significant other ?Available Help at Discharge: Family;Available 24 hours/day ?Type of Home: House ?Home Access: Level entry ?  ?  ?  ?Home Layout: One level ?Home Equipment: Rolling Walker (2 wheels);BSC/3in1;Shower seat ?   ?  ?Prior Function Prior Level of Function : Needs assist ?  ?  ?  ?  ?  ?  ?Mobility Comments: per review of notes from AIR, at time of discharge, pt was ambulating with supervision with rolling walker ?ADLs Comments: Per review of AIR notes at time of  discharge, pt was able to complete ADLs with supervision.  Pt, however, reports that spouse assists her ?  ? ? ?Hand Dominance  ? Dominant Hand: Right ? ?  ?Extremity/Trunk Assessment  ? Upper Extremity Assessment ?Upper Extremity Assessment: Defer to OT evaluation ?  ? ?Lower Extremity Assessment ?Lower Extremity Assessment: Difficult to assess due to impaired cognition (Lethargy) ?  ? ?Cervical / Trunk Assessment ?Cervical / Trunk Assessment: Kyphotic  ?Communication  ? Communication: HOH  ?Cognition Arousal/Alertness: Lethargic ?Behavior During Therapy: Flat affect ?Overall Cognitive Status: Difficult to  assess ?  ?  ?  ?  ?  ?  ?  ?  ?  ?  ?  ?  ?  ?  ?  ?  ?General Comments: Pt did arouse for very brief periods but didn't answer any orientation questions. Didn't follow commands ?  ?  ? ?  ?General Comments   ? ?  ?Exercises    ? ?Assessment/Plan  ?  ?PT Assessment    ?PT Problem List   ? ?   ?  ?PT Treatment Interventions     ? ?PT Goals (Current goals can be found in the Care Plan section)  ?Acute Rehab PT Goals ?PT Goal Formulation: Patient unable to participate in goal setting ?Time For Goal Achievement: 07/17/21 ?Potential to Achieve Goals: Good ? ?  ?Frequency   ?  ? ? ?Co-evaluation   ?  ?  ?  ?  ? ? ?  ?AM-PAC PT "6 Clicks" Mobility  ?Outcome Measure   ?  ?  ?  ?  ?  ?  ? ?  ?End of Session   ?  ?  ?  ?  ?  ? ?Time: [id number] ?PT Time Calculation (min) (ACUTE ONLY): 15 min ? ? ?Charges:   PT Evaluation ?$PT Eval Moderate Complexity: 1 Mod ?  ?  ?   ? ? ?[facility] PT ?Acute Rehabilitation Services ?Pager [phone] ?Office [phone] ? ? ?Shary Decamp [facility] ?07/03/2021, 4:06 PM ? ?

## 2021-07-04 ENCOUNTER — Inpatient Hospital Stay (HOSPITAL_COMMUNITY): Payer: Medicare HMO

## 2021-07-04 DIAGNOSIS — N390 Urinary tract infection, site not specified: Secondary | ICD-10-CM | POA: Diagnosis not present

## 2021-07-04 DIAGNOSIS — A419 Sepsis, unspecified organism: Secondary | ICD-10-CM | POA: Diagnosis not present

## 2021-07-04 LAB — CBC
HCT: 32.6 % — ABNORMAL LOW (ref 36.0–46.0)
Hemoglobin: 9.7 g/dL — ABNORMAL LOW (ref 12.0–15.0)
MCH: 26.9 pg (ref 26.0–34.0)
MCHC: 29.8 g/dL — ABNORMAL LOW (ref 30.0–36.0)
MCV: 90.6 fL (ref 80.0–100.0)
Platelets: 177 10*3/uL (ref 150–400)
RBC: 3.6 MIL/uL — ABNORMAL LOW (ref 3.87–5.11)
RDW: 16.5 % — ABNORMAL HIGH (ref 11.5–15.5)
WBC: 10.9 10*3/uL — ABNORMAL HIGH (ref 4.0–10.5)
nRBC: 0 % (ref 0.0–0.2)

## 2021-07-04 LAB — BASIC METABOLIC PANEL
Anion gap: 4 — ABNORMAL LOW (ref 5–15)
BUN: 14 mg/dL (ref 8–23)
CO2: 24 mmol/L (ref 22–32)
Calcium: 8.5 mg/dL — ABNORMAL LOW (ref 8.9–10.3)
Chloride: 112 mmol/L — ABNORMAL HIGH (ref 98–111)
Creatinine, Ser: 1.26 mg/dL — ABNORMAL HIGH (ref 0.44–1.00)
GFR, Estimated: 45 mL/min — ABNORMAL LOW (ref >60)
Glucose, Bld: 104 mg/dL — ABNORMAL HIGH (ref 70–99)
Potassium: 3.8 mmol/L (ref 3.5–5.1)
Sodium: 140 mmol/L (ref 135–145)

## 2021-07-04 MED ORDER — PREGABALIN 50 MG PO CAPS
50.0000 mg | ORAL_CAPSULE | Freq: Two times a day (BID) | ORAL | Status: DC
  Administered 2021-07-04 – 2021-07-07 (×7): 50 mg via ORAL
  Filled 2021-07-04 (×7): qty 1

## 2021-07-04 NOTE — Care Management Important Message (Signed)
Important Message ? ?Patient Details  ?Name: Jenny Kelly ?MRN: [id number] ?Date of Birth: Jan 26, 1946 ? ? ?Medicare Important Message Given:  Yes ? ? ? ? ?[name] ?07/04/2021, 2:26 PM ?

## 2021-07-04 NOTE — Plan of Care (Signed)

## 2021-07-04 NOTE — Progress Notes (Signed)
Physical Therapy Treatment ?Patient Details ?Name: Jenny Kelly ?MRN: [id number] ?DOB: 1946/04/08 ?[name]: 07/04/2021 ? ? ?History of Present Illness This 76 y.o. female admitted 3/11 with vaginal pain and fever.  Dx:  sepsis secondary to UTI.  PMH includes:  recent ureteral stent placment, COPD, seizures, sleep apnea, pulmonary fibrosis, recent Rt MCA CVA, h/o mild cognitive impairment, PAF, CAD, CKD stage III ? ?  ?PT Comments  ? ? Pt more awake today than during eval but still not back to baseline per spouse. Was able to assist pt up to chair but unable to amb more than a few feet due to multiple episodes of pericare due purewick not workiing, pt with incontinence on getting up, and pt using bsc. Expect mobility will continue to improve as cognition clears.    ?Recommendations for follow up therapy are one component of a multi-disciplinary discharge planning process, led by the attending physician.  Recommendations may be updated based on patient status, additional functional criteria and insurance authorization. ? ?Follow Up Recommendations ? Home health PT ?  ?  ?Assistance Recommended at Discharge Frequent or constant Supervision/Assistance  ?Patient can return home with the following A little help with walking and/or transfers;Help with stairs or ramp for entrance;A little help with bathing/dressing/bathroom ?  ?Equipment Recommendations ? None recommended by PT  ?  ?Recommendations for Other Services   ? ? ?  ?Precautions / Restrictions Precautions ?Precautions: Fall  ?  ? ?Mobility ? Bed Mobility ?Overal bed mobility: Needs Assistance ?Bed Mobility: Rolling, Sidelying to Sit ?Rolling: Mod assist ?Sidelying to sit: Mod assist ?  ?  ?  ?General bed mobility comments: Assist to initiate all movement, elevate trunk into sitting, and bring hips to EOB. ?  ? ?Transfers ?Overall transfer level: Needs assistance ?Equipment used: Rolling walker (2 wheels) ?Transfers: Sit to/from Stand, Bed to  chair/wheelchair/BSC ?Sit to Stand: Min assist, +2 safety/equipment ?  ?Step pivot transfers: Min assist ?  ?  ?  ?General transfer comment: Assist to initiate standing and to place lt hand on walker. Bed to bsc with walker with min assist for balance and cues to keep stepping ?  ? ?Ambulation/Gait ?Ambulation/Gait assistance: Min assist, +2 safety/equipment ?Gait Distance (Feet): 3 Feet ?Assistive device: Rolling walker (2 wheels) ?Gait Pattern/deviations: Step-to pattern, Decreased step length - right, Decreased step length - left ?Gait velocity: decr ?Gait velocity interpretation: <1.31 ft/sec, indicative of household ambulator ?  ?General Gait Details: Assist for balance and support. Verbal cues to attend to task. ? ? ?Stairs ?  ?  ?  ?  ?  ? ? ?Wheelchair Mobility ?  ? ?Modified Rankin (Stroke Patients Only) ?  ? ? ?  ?Balance Overall balance assessment: Needs assistance ?Sitting-balance support: Feet supported, No upper extremity supported ?Sitting balance-Leahy Scale: Fair ?  ?  ?Standing balance support: Single extremity supported, Bilateral upper extremity supported ?Standing balance-Leahy Scale: Poor ?Standing balance comment: walker and min guard for static standing. Repeated standing for pericare after multiple episodes of urination ?  ?  ?  ?  ?  ?  ?  ?  ?  ?  ?  ?  ? ?  ?Cognition Arousal/Alertness: Awake/alert ?Behavior During Therapy: Flat affect ?Overall Cognitive Status: Impaired/Different from baseline ?Area of Impairment: Orientation, Attention, Memory, Safety/judgement, Following commands, Awareness, Problem solving ?  ?  ?  ?  ?  ?  ?  ?  ?Orientation Level: Disoriented to, Place, Time, Situation ?Current Attention Level: Sustained ?Memory: Decreased  short-term memory ?Following Commands: Follows one step commands inconsistently, Follows one step commands with increased time ?Safety/Judgement: Decreased awareness of safety, Decreased awareness of deficits ?Awareness: Intellectual ?Problem  Solving: Decreased initiation, Requires verbal cues, Requires tactile cues ?General Comments: Pt very perseverative on being wet from urine (purewick not functioning) ?  ?  ? ?  ?Exercises   ? ?  ?General Comments   ?  ?  ? ?Pertinent Vitals/Pain Pain Assessment ?Pain Assessment: Faces ?Faces Pain Scale: Hurts little more ?Pain Location: LUE ?Pain Descriptors / Indicators: Grimacing, Guarding, Moaning ?Pain Intervention(s): Monitored during session, Repositioned  ? ? ?Home Living   ?  ?  ?  ?  ?  ?  ?  ?  ?  ?   ?  ?Prior Function    ?  ?  ?   ? ?PT Goals (current goals can now be found in the care plan section) Progress towards PT goals: Progressing toward goals ? ?  ?Frequency ? ? ? Min 3X/week ? ? ? ?  ?PT Plan Discharge plan needs to be updated  ? ? ?Co-evaluation   ?  ?  ?  ?  ? ?  ?AM-PAC PT "6 Clicks" Mobility   ?Outcome Measure ? Help needed turning from your back to your side while in a flat bed without using bedrails?: A Lot ?Help needed moving from lying on your back to sitting on the side of a flat bed without using bedrails?: A Lot ?Help needed moving to and from a bed to a chair (including a wheelchair)?: A Little ?Help needed standing up from a chair using your arms (e.g., wheelchair or bedside chair)?: A Little ?Help needed to walk in hospital room?: Total ?Help needed climbing 3-5 steps with a railing? : Total ?6 Click Score: 12 ? ?  ?End of Session Equipment Utilized During Treatment: Gait belt ?Activity Tolerance: Other (comment) (limited due to urinary incontinence) ?Patient left: in chair;with call bell/phone within reach;with chair alarm set;with family/visitor present ?Nurse Communication: Other (comment) (tech - purewick not working) ?PT Visit Diagnosis: Unsteadiness on feet (R26.81);Other abnormalities of gait and mobility (R26.89) ?  ? ? ?Time: [id number] ?PT Time Calculation (min) (ACUTE ONLY): 28 min ? ?Charges:  $Therapeutic Activity: 23-37 mins          ?          ? ?[facility]  PT ?Acute Rehabilitation Services ?Pager [phone] ?Office [phone] ? ? ? ?Shary Decamp [facility] ?07/04/2021, 10:39 AM ? ?

## 2021-07-04 NOTE — Progress Notes (Signed)
Pt refusing bipap. PT on RA. No resp distress noted. Will continue to monitor.  ?

## 2021-07-04 NOTE — Progress Notes (Signed)
PHARMACY - PHYSICIAN COMMUNICATION ?CRITICAL VALUE ALERT - BLOOD CULTURE IDENTIFICATION (BCID) ? ?Jenny Kelly is an 76 y.o. female who presented to [facility] on 07/01/2021 with a chief complaint of fevers/UTI ? ?Assessment:  ?1/2 blood cultures growing Gram positive rods, likely contaminant ? ?Name of physician (or Provider) Contacted:  ?Dr. Cyd Silence ? ?Current antibiotics:  ?Cefepime ? ?Changes to prescribed antibiotics recommended:  ?No changes at this time ? ? ? ?Jenny Kelly, [name] ?07/04/2021  2:17 AM ? ?

## 2021-07-04 NOTE — Progress Notes (Signed)
?PROGRESS NOTE ? ? ? ?Jenny Kelly  [id number] DOB: 1945/07/18 DOA: 07/01/2021 ?PCP: Townsend Roger, MD  ? ? ?Brief Narrative:  ?Jenny Kelly is a 76 y.o. female with medical history significant of afib; chronic diastolic CHF (grade 2 on echo on 05/02/21); COPD; DM; HLD; OSA; CVA; dementia; hypothyroidism; and seizures presenting with fever.   She was seen at [facility] yesterday for UTI and discharged with PO antibiotics.  She was last admitted here from 1/9-15 (and then to CIR) for acute ischemic stroke in conjunction with septic shock from pyelo/L renal calculus s/p L ureteral stenting.  She reports that she has had pain in her "punanny" (vagina) since the stent was placed.  She had worsening pain the past few days and so went to [facility] overnight yesterday.  She was not febrile and an evaluation showed UTI so she was given oral abx and pain meds and discharged.  The pain was intolerable overnight and she developed fever and so they came here.  Her pain is still severe in her vagina.  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  Today has been repeating words.  ? ? ?Assessment and Plan: ?* Sepsis secondary to UTI [facility]) ?-SIRS criteria in this patient includes: Leukocytosis, tachycardia, tachypnea  ?-Patient has no current evidence of acute organ failure ?-Suspected source is UTI; she has a known nonobstructing stone with recent stent placement ?-1/9 E coli UTI, sensitive to Cefepime ?-Blood and urine cultures pending ?-Treat with IV Cefepime for presumed complicated urinary source x 7 days ? ? ?S/P ureteral stent placement ?-Ureteral stent during last hospitalization ?-At that time, she had pyelonephritis and had septic shock requiring pressors in the ICU ?-She reports persistent pain in her vagina since stent placement ?-Urology consulted ?-pain: valium did not help per patient ?-will try ditropan- husband thinks helped ? ?DNR (do not resuscitate) ?-admitting  MD discussed code status with the patient and her husband and they are in agreement that the patient would not desire resuscitation and would prefer to die a natural death should that situation arise. ? ? ?Prolonged QT interval ?-Will attempt to avoid QT-prolonging medications such as PPI, nausea meds, SSRIs ? ? ?COPD (chronic obstructive pulmonary disease) (HCC) ?-On 2L [place] O2 ?-Continue Pulmicort, Singulair ?-Hold Anoro, Albuterol due to prolonged QTc ? ?Seizures ([place]) ?-Continue Topamax ?-resume lyrica (for headaches) ? ?Sleep apnea ?-refused CPAP ? ?Pulmonary fibrosis ([place]) ?-Noted on imaging today ?-Continue Pulmicort, Singulair ?-Hold Anoro, Albuterol due to prolonged QTc ? ?Right middle cerebral artery stroke [facility]) ?-Acute CVA during last hospitalization ?-She has residual primarily LUE hemiparesis ?-Continue ASA ?-PT/OT ?-? Symptom recrudesce check head CT ? ?Memory deficit ?-Mild cognitive impairment ?-Hold Aricept due to prolonged QTc ? ?PAF (paroxysmal atrial fibrillation) ([place]) ?-She does not appear to be taking rate-controlling medications ?-resume xarelto as no procedure is planned at this point ? ?Coronary artery disease involving native coronary artery of native heart ?-s/p stent ?-Continue ASA ? ?Hyperlipidemia ?-Continue Pravachol ? ?Hypothyroidism ?-Continue Synthroid ? ?CKD (chronic kidney disease), stage III ([place]) ?-Stage 3b CKD ?-Appears to be stable for now ? ? ? ? ? ? ? ? ? ?DVT prophylaxis:  ?Rivaroxaban (XARELTO) tablet 15 mg  ?  Code Status: DNR ?Family Communication: at bedside ? ?Disposition Plan:  ?Level of care: Med-Surg ?Status is: Inpatient ?Remains inpatient appropriate because: needs IV abx ?  ? ?Consultants:  ?urology ? ? ?Subjective: ?Valium did not  help with stent pain ? ?Objective: ?Vitals:  ? 07/03/21 2019 07/03/21 2058 07/04/21 0528 07/04/21 0920  ?BP:  (!) 109/56 (!) 115/49 (!) 121/53  ?Pulse:  75 68 71  ?Resp:  16 16 17   ?Temp:  98.2 ?F (36.8 ?C) 97.9 ?F (36.6 ?C) 98.1 ?F  (36.7 ?C)  ?TempSrc:      ?SpO2: 92% 93% 92% 90%  ?Weight:      ?Height:      ? ? ?Intake/Output Summary (Last 24 hours) at 07/04/2021 1347 ?Last data filed at 07/04/2021 0800 ?Gross per 24 hour  ?Intake 720 ml  ?Output 0 ml  ?Net 720 ml  ? ?Filed Weights  ? 07/01/21 1737  ?Weight: 68.2 kg  ? ? ?Examination: ? ? ? ?General: Appearance:     ?Overweight female repeating words, up in chair  ?   ?Lungs:     respirations unlabored  ?Heart:    Normal heart rate.  ?MS:   All extremities are intact.  ?  ?Neurologic:   Awake, alert, weakness on leftside  ?  ?  ? ? ? ?Data Reviewed: I have personally reviewed following labs and imaging studies ? ?CBC: ?Recent Labs  ?Lab 07/01/21 ?0335 07/02/21 ?0111 07/03/21 ?[id number] 07/04/21 ?0654  ?WBC 13.6* 11.7* 9.5 10.9*  ?HGB 11.3* 9.8* 11.8* 9.7*  ?HCT 37.8 32.3* 40.0 32.6*  ?MCV 92.9 90.7 91.5 90.6  ?PLT 196 149* 116* 177  ? ?Basic Metabolic Panel: ?Recent Labs  ?Lab 07/01/21 ?0335 07/02/21 ?0111 07/04/21 ?0654  ?NA 143 140 140  ?K 4.2 3.5 3.8  ?CL 115* 112* 112*  ?CO2 21* 22 24  ?GLUCOSE 139* 117* 104*  ?BUN 22 19 14   ?CREATININE 1.49* 1.35* 1.26*  ?CALCIUM 8.3* 7.7* 8.5*  ? ?GFR: ?Estimated Creatinine Clearance: 34.9 mL/min (A) (by C-G formula based on SCr of 1.26 mg/dL (H)). ?Liver Function Tests: ?Recent Labs  ?Lab 07/01/21 ?0335  ?AST 14*  ?ALT 6  ?ALKPHOS 71  ?BILITOT 0.1*  ?PROT 6.5  ?ALBUMIN 2.5*  ? ?No results for input(s): LIPASE, AMYLASE in the last 168 hours. ?No results for input(s): AMMONIA in the last 168 hours. ?Coagulation Profile: ?Recent Labs  ?Lab 07/01/21 ?0335  ?INR 1.4*  ? ?Cardiac Enzymes: ?No results for input(s): CKTOTAL, CKMB, CKMBINDEX, TROPONINI in the last 168 hours. ?BNP (last 3 results) ?No results for input(s): PROBNP in the last 8760 hours. ?HbA1C: ?No results for input(s): HGBA1C in the last 72 hours. ?CBG: ?No results for input(s): GLUCAP in the last 168 hours. ?Lipid Profile: ?No results for input(s): CHOL, HDL, LDLCALC, TRIG, CHOLHDL, LDLDIRECT in the  last 72 hours. ?Thyroid Function Tests: ?No results for input(s): TSH, T4TOTAL, FREET4, T3FREE, THYROIDAB in the last 72 hours. ?Anemia Panel: ?No results for input(s): VITAMINB12, FOLATE, FERRITIN, TIBC, IRON, RETICCTPCT in the last 72 hours. ?Sepsis Labs: ?Recent Labs  ?Lab 07/01/21 ?[id number] 07/01/21 ?1228  ?PROCALCITON  --  0.16  ?LATICACIDVEN 1.1  --   ? ? ?Recent Results (from the past 240 hour(s))  ?Blood Culture (routine x 2)     Status: None (Preliminary result)  ? Collection Time: 07/01/21  3:28 AM  ? Specimen: BLOOD  ?Result Value Ref Range Status  ? Specimen Description BLOOD RIGHT ANTECUBITAL  Final  ? Special Requests   Final  ?  BOTTLES DRAWN AEROBIC AND ANAEROBIC Blood Culture adequate volume  ? Culture  Setup Time   Final  ?  GRAM POSITIVE RODS ?AEROBIC BOTTLE ONLY ?CRITICAL RESULT CALLED TO, READ BACK  BY AND VERIFIED WITH: ?PHARMD GREG ABBOTT 07/04/21@2 :17 BY TW ?  ? Culture   Final  ?  NO GROWTH 3 DAYS ?Performed at [facility], [place] [address] [place], [place] [address] ?  ? Report Status PENDING  Incomplete  ?Urine Culture     Status: Abnormal  ? Collection Time: 07/01/21  3:47 AM  ? Specimen: In/Out Cath Urine  ?Result Value Ref Range Status  ? Specimen Description IN/OUT CATH URINE  Final  ? Special Requests   Final  ?  NONE ?Performed at [facility], [place] [address] [place],  [address] ?  ? Culture 30,000 COLONIES/mL ESCHERICHIA COLI (A)  Final  ? Report Status 07/03/2021 FINAL  Final  ? Organism ID, Bacteria ESCHERICHIA COLI (A)  Final  ?    Susceptibility  ? Escherichia coli - MIC*  ?  AMPICILLIN >=32 RESISTANT Resistant   ?  CEFAZOLIN <=4 SENSITIVE Sensitive   ?  CEFEPIME <=0.12 SENSITIVE Sensitive   ?  CEFTRIAXONE <=0.25 SENSITIVE Sensitive   ?  CIPROFLOXACIN 0.5 INTERMEDIATE Intermediate   ?  GENTAMICIN <=1 SENSITIVE Sensitive   ?  IMIPENEM <=0.25 SENSITIVE Sensitive   ?  NITROFURANTOIN <=16 SENSITIVE Sensitive   ?  TRIMETH/SULFA <=20 SENSITIVE Sensitive   ?   AMPICILLIN/SULBACTAM 16 INTERMEDIATE Intermediate   ?  PIP/TAZO <=4 SENSITIVE Sensitive   ?  * 30,000 COLONIES/mL ESCHERICHIA COLI  ?Blood Culture (routine x 2)     Status: None (Preliminary result)  ? Collection Ti

## 2021-07-04 NOTE — Progress Notes (Signed)
Occupational Therapy Treatment ?Patient Details ?Name: Jenny Kelly ?MRN: [id number] ?DOB: 22-Apr-1946 ?[name]: 07/04/2021 ? ? ?History of present illness This 76 y.o. female admitted 3/11 with vaginal pain and fever.  Dx:  sepsis secondary to UTI.  PMH includes:  recent ureteral stent placment, COPD, seizures, sleep apnea, pulmonary fibrosis, recent Rt MCA CVA, h/o mild cognitive impairment, PAF, CAD, CKD stage III ?  ?OT comments ? Pt with increased confusion this session, not oriented to place, time, or situation.  She needs increased time and mod to max demonstrational cueing for initiation of all tasks secondary to slow processing.  Transfers to the [facility] and bed were min assist stand pivot with pt moaning and repeating phrases throughout session such as "I can't".  Pt's spouse present throughout session and reports change in cognition since morphine was given yesterday.  Will continue to follow in acute care.    ? ?Recommendations for follow up therapy are one component of a multi-disciplinary discharge planning process, led by the attending physician.  Recommendations may be updated based on patient status, additional functional criteria and insurance authorization. ?   ?Follow Up Recommendations ? Home health OT  ?  ?Assistance Recommended at Discharge Frequent or constant Supervision/Assistance  ?Patient can return home with the following ? A little help with walking and/or transfers;A lot of help with bathing/dressing/bathroom;Assistance with cooking/housework;Direct supervision/assist for medications management;Help with stairs or ramp for entrance;Assist for transportation;Direct supervision/assist for financial management ?  ?Equipment Recommendations ? None recommended by OT  ?  ?Recommendations for Other Services   ? ?  ?Precautions / Restrictions Precautions ?Precautions: Fall ?Precaution Comments: hx of left hemiparesis and CVA ?Restrictions ?Weight Bearing Restrictions: No  ? ? ?  ? ?Mobility Bed  Mobility ?Overal bed mobility: Needs Assistance ?Bed Mobility: Sit to Supine ?  ?  ?  ?Sit to supine: Min assist ?  ?  ?  ? ?Transfers ?  ?  ?  ?  ?  ?  ?  ?  ?  ?  ?  ?  ?Balance Overall balance assessment: Needs assistance ?Sitting-balance support: Feet supported, No upper extremity supported ?Sitting balance-Leahy Scale: Fair ?  ?  ?Standing balance support: Single extremity supported, Bilateral upper extremity supported ?Standing balance-Leahy Scale: Poor ?Standing balance comment: Therapist provided support in front of pt while completing transfers and to the side for standing with toileting tasks. ?  ?  ?  ?  ?  ?  ?  ?  ?  ?  ?  ?   ? ?ADL either performed or assessed with clinical judgement  ? ?ADL Overall ADL's : Needs assistance/impaired ?Eating/Feeding: Moderate assistance ?  ?  ?  ?  ?  ?  ?  ?  ?  ?  ?  ?Toilet Transfer: Minimal assistance;BSC/3in1 ?  ?Toileting- Clothing Manipulation and Hygiene: Moderate assistance;Sit to/from stand ?  ?  ?  ?  ?General ADL Comments: Pt's spouse reports increased confusion this session with decreased initiation and completion of tasks.  She would moan and make repeated statements during session with decreased ability to follow one step commands consistently with increased time.  Increased swelling noted in the right hand with increased shoulder pain also noted.  Educated spouse on positioning hand above elbow when in the bed as well as the chair.  Also encouraged him to have her attempt AROM exercises throughout the day as well in pain free ROM.  Encouraged AAROM as needed if she cannot complete  movements, but not past where she reports pain. ?  ? ? ?   ?   ?   ? ?Cognition Arousal/Alertness: Awake/alert ?Behavior During Therapy: Flat affect ?Overall Cognitive Status: Impaired/Different from baseline ?  ?  ?  ?  ?  ?  ?  ?  ?  ?Orientation Level: Disoriented to, Place, Time, Situation ?  ?Memory: Decreased short-term memory ?Following Commands: Follows one step  commands inconsistently ?Safety/Judgement: Decreased awareness of safety, Decreased awareness of deficits ?Awareness: Intellectual ?Problem Solving: Decreased initiation, Requires verbal cues, Requires tactile cues, Difficulty sequencing ?General Comments: Pt with multiple episodes of repeating over and over "I don't know" when asked where she lived as well as orientation questions.  She was able to identify her spouse as well as state her name, but otherwise was not oriented and did not know why she was in the hospital.  Pt's spouse reports that since she recieved morphine yesterday, she has been confused way more than baseline.  Decreased ability to follow one step commands throughout session without max tactile cueing for initiation. ?  ?  ?   ?   ?   ?   ? ? ?Pertinent Vitals/ Pain       Pain Assessment ?Pain Assessment: Faces ?Faces Pain Scale: Hurts little more ?Pain Location: LUE ?Pain Descriptors / Indicators: Grimacing, Guarding, Moaning ?Pain Intervention(s): Limited activity within patient's tolerance, Monitored during session, Repositioned ? ?   ?   ? ?Frequency ? Min 2X/week  ? ? ? ? ?  ?Progress Toward Goals ? ?OT Goals(current goals can now be found in the care plan section) ? Progress towards OT goals: Progressing toward goals ? ?Acute Rehab OT Goals ?Potential to Achieve Goals: Good  ?Plan Discharge plan remains appropriate   ? ?AM-PAC OT "6 Clicks" Daily Activity     ?Outcome Measure ? ? Help from another person eating meals?: A Little ?Help from another person taking care of personal grooming?: A Little ?Help from another person toileting, which includes using toliet, bedpan, or urinal?: A Lot ?Help from another person bathing (including washing, rinsing, drying)?: A Lot ?Help from another person to put on and taking off regular upper body clothing?: A Little ?Help from another person to put on and taking off regular lower body clothing?: A Lot ?6 Click Score: 15 ? ?  ?End of Session   ? ?OT Visit  Diagnosis: Unsteadiness on feet (R26.81);Cognitive communication deficit (R41.841);Pain;Muscle weakness (generalized) (M62.81);Hemiplegia and hemiparesis ?Hemiplegia - Right/Left: Left ?Hemiplegia - dominant/non-dominant: Non-Dominant ?Hemiplegia - caused by: Cerebral infarction ?Pain - Right/Left: Left ?Pain - part of body: Arm ?  ?Activity Tolerance Other (comment) (treatment limited by cognition as well as pain) ?  ?Patient Left   ?  ?Nurse Communication Mobility status ?  ? ?   ? ?Time: [id number] ?OT Time Calculation (min): 34 min ? ?Charges: OT General Charges ?$OT Visit: 1 Visit ?OT Treatments ?$Self Care/Home Management : 23-37 mins ? ? ?Alandis Bluemel OTR/L ?07/04/2021, 1:36 PM ?

## 2021-07-05 DIAGNOSIS — N390 Urinary tract infection, site not specified: Secondary | ICD-10-CM | POA: Diagnosis not present

## 2021-07-05 DIAGNOSIS — A419 Sepsis, unspecified organism: Secondary | ICD-10-CM | POA: Diagnosis not present

## 2021-07-05 LAB — BASIC METABOLIC PANEL
Anion gap: 8 (ref 5–15)
BUN: 15 mg/dL (ref 8–23)
CO2: 22 mmol/L (ref 22–32)
Calcium: 8.2 mg/dL — ABNORMAL LOW (ref 8.9–10.3)
Chloride: 111 mmol/L (ref 98–111)
Creatinine, Ser: 1.2 mg/dL — ABNORMAL HIGH (ref 0.44–1.00)
GFR, Estimated: 47 mL/min — ABNORMAL LOW (ref >60)
Glucose, Bld: 92 mg/dL (ref 70–99)
Potassium: 3.4 mmol/L — ABNORMAL LOW (ref 3.5–5.1)
Sodium: 141 mmol/L (ref 135–145)

## 2021-07-05 LAB — CBC
HCT: 31.8 % — ABNORMAL LOW (ref 36.0–46.0)
Hemoglobin: 9.7 g/dL — ABNORMAL LOW (ref 12.0–15.0)
MCH: 27.2 pg (ref 26.0–34.0)
MCHC: 30.5 g/dL (ref 30.0–36.0)
MCV: 89.1 fL (ref 80.0–100.0)
Platelets: 163 10*3/uL (ref 150–400)
RBC: 3.57 MIL/uL — ABNORMAL LOW (ref 3.87–5.11)
RDW: 16.4 % — ABNORMAL HIGH (ref 11.5–15.5)
WBC: 9.8 10*3/uL (ref 4.0–10.5)
nRBC: 0 % (ref 0.0–0.2)

## 2021-07-05 MED ORDER — SODIUM CHLORIDE 0.9 % IV SOLN
1.0000 g | INTRAVENOUS | Status: AC
  Administered 2021-07-05 – 2021-07-07 (×3): 1 g via INTRAVENOUS
  Filled 2021-07-05 (×3): qty 10

## 2021-07-05 NOTE — Progress Notes (Signed)
Physical Therapy Treatment ?Patient Details ?Name: Jenny Kelly ?MRN: [id number] ?DOB: [birth year] ?[name]: 07/05/2021 ? ? ?History of Present Illness This 75 y.o. female admitted 3/11 with vaginal pain and fever.  Dx:  sepsis secondary to UTI.  PMH includes:  recent ureteral stent placment, COPD, seizures, sleep apnea, pulmonary fibrosis, recent Rt MCA CVA, h/o mild cognitive impairment, PAF, CAD, CKD stage III ? ?  ?PT Comments  ? ? Continuing work on functional mobility and activity tolerance;  Session focused on getting up, OOB, and to initiate gait training; Mod assist for bed moiblity, min/mod assist for transfers (including to [facility], where pt successfully voided), and light mod assist for ambulation in the room; Noteworthy L shoulder pain today, which limited pt's ability to position her hand effectively on RW -- had to support pt's LUE and assist with steering RW with short distance amb today;  ? ?Agree with dc home with [facility] follow up services; In considering options for discharge, I value going back to familiar environment, caregivers, and routines for patients with dementia; Will need to know how much assist her family can provide; at this time she is not back to her baseline functional level  ?Recommendations for follow up therapy are one component of a multi-disciplinary discharge planning process, led by the attending physician.  Recommendations may be updated based on patient status, additional functional criteria and insurance authorization. ? ?Follow Up Recommendations ? Home health PT ?  ?  ?Assistance Recommended at Discharge Frequent or constant Supervision/Assistance  ?Patient can return home with the following A little help with walking and/or transfers;Help with stairs or ramp for entrance;A little help with bathing/dressing/bathroom ?  ?Equipment Recommendations ? None recommended by PT  ?  ?Recommendations for Other Services   ? ? ?  ?Precautions / Restrictions Precautions ?Precautions:  Fall ?Precaution Comments: hx of left hemiparesis and CVA ?Restrictions ?Weight Bearing Restrictions: No  ?  ? ?Mobility ? Bed Mobility ?Overal bed mobility: Needs Assistance ?Bed Mobility: Rolling, Sidelying to Sit ?Rolling: Min assist ?Sidelying to sit: Mod assist, +2 for physical assistance ?  ?  ?  ?General bed mobility comments: Assist to initiate all movement, elevate trunk into sitting, and bring hips to EOB. (Assisted in ability to roll to sidelying and to sitting) ?  ? ?Transfers ?Overall transfer level: Needs assistance ?Equipment used: Rolling walker (2 wheels) ?Transfers: Sit to/from Stand, Bed to chair/wheelchair/BSC ?Sit to Stand: +2 physical assistance, Min assist (Pt needed tactile cues to initiate action of coming to EOB and bringing feet to floor.) ?Stand pivot transfers: Min assist ?Step pivot transfers: Min assist ?  ?  ?  ?General transfer comment: Assist to initiate standing and to place lt hand on walker. Bed to bsc with walker with min assist for balance and cues to keep stepping ?  ? ?Ambulation/Gait ?Ambulation/Gait assistance: Min assist, +2 safety/equipment ?  ?Assistive device: Rolling walker (2 wheels) ?Gait Pattern/deviations: Decreased step length - right, Decreased step length - left ?  ?  ?  ?General Gait Details: Assist for balance and support. Verbal cues to attend to task and to pivot and follow through with task. ? ? ?Stairs ?  ?  ?  ?  ?  ? ? ?Wheelchair Mobility ?  ? ?Modified Rankin (Stroke Patients Only) ?  ? ? ?  ?Balance Overall balance assessment: Mild deficits observed, not formally tested ?Sitting-balance support: Feet unsupported, No upper extremity supported ?Sitting balance-Leahy Scale: Fair ?  ?  ?Standing balance  support: Single extremity supported, Bilateral upper extremity supported (Supported LUE with PT's arm while standing and using walker) ?Standing balance-Leahy Scale: Poor ?Standing balance comment: PT provided support from behind and side when standing and  pivtoing during walking tasks. ?  ?  ?  ?  ?  ?  ?  ?  ?  ?  ?  ?  ? ?  ?Cognition Arousal/Alertness: Awake/alert (Woken up upon entering session, tired and a bit drowsy) ?Behavior During Therapy: [facility] for tasks assessed/performed ?Overall Cognitive Status: Impaired/Different from baseline ?Area of Impairment: Orientation, Attention, Memory, Safety/judgement, Following commands, Awareness (Pt repeated task phrases frequently) ?  ?  ?  ?  ?  ?  ?  ?  ?Orientation Level: Disoriented to, Place ?Current Attention Level: Sustained ?Memory: Decreased short-term memory ?Following Commands: Follows one step commands inconsistently ?Safety/Judgement: Decreased awareness of safety, Decreased awareness of deficits ?Awareness: Intellectual ?Problem Solving: Decreased initiation, Requires verbal cues, Requires tactile cues, Difficulty sequencing ?General Comments: Pt was able to hear directions for tasks, but often repeated herself; unsure if this is due to comprehension or cognitive deficit ?  ?  ? ?  ?Exercises   ? ?  ?General Comments General comments (skin integrity, edema, etc.): Pain in LUE that limited ability to use walker efficiently. ?  ?  ? ?Pertinent Vitals/Pain Pain Assessment ?Pain Assessment: Faces ?Faces Pain Scale: Hurts little more ?Breathing: normal ?Negative Vocalization: occasional moan/groan, low speech, negative/disapproving quality ?Facial Expression: smiling or inexpressive ?Body Language: tense, distressed pacing, fidgeting ?Consolability: distracted or reassured by voice/touch ?PAINAD Score: 3 ?Pain Location: LUE ?Pain Descriptors / Indicators: Discomfort, Guarding, Moaning, Grimacing ?Pain Intervention(s): Limited activity within patient's tolerance  ? ? ?Home Living   ?  ?  ?  ?  ?  ?  ?  ?  ?  ?   ?  ?Prior Function    ?  ?  ?   ? ?PT Goals (current goals can now be found in the care plan section) Acute Rehab PT Goals ?PT Goal Formulation: Patient unable to participate in goal setting ?Time For Goal  Achievement: 07/17/21 ?Potential to Achieve Goals: Good ?Progress towards PT goals: Progressing toward goals ? ?  ?Frequency ? ? ? Min 3X/week ? ? ? ?  ?PT Plan Current plan remains appropriate  ? ? ?Co-evaluation   ?  ?  ?  ?  ? ?  ?AM-PAC PT "6 Clicks" Mobility   ?Outcome Measure ? Help needed turning from your back to your side while in a flat bed without using bedrails?: A Lot ?Help needed moving from lying on your back to sitting on the side of a flat bed without using bedrails?: A Lot ?Help needed moving to and from a bed to a chair (including a wheelchair)?: A Little ?Help needed standing up from a chair using your arms (e.g., wheelchair or bedside chair)?: A Little ?Help needed to walk in hospital room?: A Little ?Help needed climbing 3-5 steps with a railing? : Total ?6 Click Score: 14 ? ?  ?End of Session Equipment Utilized During Treatment: Gait belt ?Activity Tolerance: Patient tolerated treatment well ?Patient left: in chair;with call bell/phone within reach;with chair alarm set ?  ?PT Visit Diagnosis: Unsteadiness on feet (R26.81);Other abnormalities of gait and mobility (R26.89) ?  ? ? ?Time: 1535-1601 ?PT Time Calculation (min) (ACUTE ONLY): 26 min ? ?Charges:  $Gait Training: 8-22 mins ?$Therapeutic Activity: 8-22 mins          ?          ? ?  Roney Marion, PT  ?Acute Rehabilitation Services ?Pager [phone] ?Office [phone] ? ? ? ?Colletta [place] ?07/05/2021, 7:21 PM ? ?

## 2021-07-05 NOTE — Progress Notes (Signed)
? ?Jenny Kelly  [id number] DOB: 03-23-1946 DOA: 07/01/2021 ?PCP: Townsend Roger, MD   ? ?Brief Narrative:  ?76 year old with a history of atrial fibrillation, chronic diastolic CHF, COPD, DM 2, HLD, OSA, dementia, CVA, hypothyroidism, and seizure disorder who presented to the ED with a fever.  She had been seen at an outside hospital the day prior, diagnosed with UTI, and discharged with oral antibiotics.  She was admitted to [facility] January 9-15/2023 for acute ischemic stroke along with septic shock from pyelonephritis requiring left ureteral stenting, and required admission to CIR afterwards.  She complained of perineal pain which she blamed on the urinary stent. ? ?Consultants:  ?Urology ? ?Code Status: NO CODE BLUE ? ?DVT prophylaxis: ?Xarelto ? ?Interim Hx: ?Afebrile.  Vital signs stable.  Saturation 100% room air.  Alert and conversant but confused and not able to provide a reliable history on her own.  No evidence of acute distress.  No family present at time of exam. ? ?Assessment & Plan: ? ?Sepsis due to complicated E. coli UTI ?Met sepsis criteria due to leukocytosis, tachycardia, and tachypnea in the setting of a bacterial infection -known obstructing stone status post recent stent -continue IV antibiotic now with transition to Keflex as her organism is only intermediately sensitive to Cipro ? ?Obstructing proximal right ureteral urinary stone status post stent placement January 2023 ?At time of stent placement patient was in septic shock with pyelonephritis -complains of persistent pain in the perineum since stent placement -urology consulted -Dr. Lovena Neighbours planning definitive treatment when medically cleared as outpatient -procedure being delayed 2 months given recent stroke ? ?Prolonged QTc ?Careful consideration of medical therapies ? ?COPD ?Requires 2 L nasal cannula O2 chronically -continue usual inhaled medications ? ?Seizure disorder ?Continue usual Topamax ? 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 ?Dementia ?Holding Aricept in setting of prolonged QTc ? ?PAF ?Does not require rate controlling medications at baseline -continue Xarelto ? ?CAD status post stenting ?Continue ASA ? ?HLD ?Continue Pravachol ? ?Hypothyroidism ?Continue Synthroid ? ?CKD stage IIIb ?Renal function stable presently ? ?Recent Labs  ?Lab 07/01/21 ?0335 07/02/21 ?0111 07/04/21 ?0654 07/05/21 ?0306  ?CREATININE 1.49* 1.35* 1.26* 1.20*  ? ? ?Family Communication: No family present at time of exam ?Disposition: From home - Home health OT and PT recommended ? ?Objective: ?Blood pressure 127/61, pulse 82, temperature 98.3 ?F (36.8 ?C), temperature source Oral, resp. rate 18, height _0  (1.575 m), weight 68.2 kg, SpO2 100 %. ? ?Intake/Output Summary (Last 24 hours) at 07/05/2021 0927 ?Last data filed at 07/05/2021 0800 ?Gross per 24 hour  ?Intake 920 ml  ?Output 900 ml  ?Net 20 ml  ? ?Filed Weights  ? 07/01/21 1737  ?Weight: 68.2 kg  ? ? ?Examination: ?General: No acute respiratory distress ?Lungs: Clear to auscultation bilaterally without wheezes or crackles ?Cardiovascular: Regular rate and rhythm without murmur gallop or rub normal S1 and S2 ?Abdomen: Nontender, nondistended, soft, bowel sounds positive, no rebound, no ascites, no appreciable mass ?Extremities: No significant cyanosis, clubbing, or edema bilateral lower extremities ? ?CBC: ?Recent Labs  ?Lab 07/03/21 ?0646 07/04/21 ?0654 07/05/21 ?0306  ?WBC 9.5 10.9* 9.8  ?HGB 11.8* 9.7* 9.7*  ?HCT 40.0 32.6* 31.8*  ?MCV 91.5 90.6 89.1  ?PLT 116* 177 163  ? ?Basic Metabolic Panel: ?Recent Labs  ?Lab 07/02/21 ?0111 07/04/21 ?4888  07/05/21 ?0306  ?NA 140 140 141  ?K 3.5 3.8 3.4*  ?CL 112* 112* 111  ?CO2 _0 ?GLUCOSE 117* 104* 92  ?BUN _1 ?CREATININE 1.35* 1.26* 1.20*  ?CALCIUM 7.7* 8.5*  8.2*  ? ?GFR: ?Estimated Creatinine Clearance: 36.6 mL/min (A) (by C-G formula based on SCr of 1.2 mg/dL (H)). ? ?Liver Function Tests: ?Recent Labs  ?Lab 07/01/21 ?0335  ?AST 14*  ?ALT 6  ?ALKPHOS 71  ?BILITOT 0.1*  ?PROT 6.5  ?ALBUMIN 2.5*  ? ? ?Coagulation Profile: ?Recent Labs  ?Lab 07/01/21 ?0335  ?INR 1.4*  ? ? ?HbA1C: ?Hgb A1c MFr Bld  ?Date/Time Value Ref Range Status  ?05/03/2021 05:38 AM 5.9 (H) 4.8 - 5.6 % Final  ?  Comment:  ?  (NOTE) ?Pre diabetes:          5.7%-6.4% ? ?Diabetes:              >6.4% ? ?Glycemic control for   <7.0% ?adults with diabetes ?  ? ? ?Scheduled Meds: ? aspirin EC  81 mg Oral Daily  ? budesonide  0.5 mg Nebulization BID  ? famotidine  20 mg Oral Daily  ? levothyroxine  88 mcg Oral QAC breakfast  ? mouth rinse  15 mL Mouth Rinse BID  ? montelukast  10 mg Oral QHS  ? oxybutynin  2.5 mg Oral TID  ? pantoprazole  40 mg Oral Daily  ? pravastatin  40 mg Oral q1800  ? pregabalin  50 mg Oral BID  ? rivaroxaban  15 mg Oral Q supper  ? sodium chloride flush  3 mL Intravenous Q12H  ? topiramate  200 mg Oral BID  ? ?Continuous Infusions: ? ceFEPime (MAXIPIME) IV 2 g (07/05/21 0527)  ? ? ? LOS: 4 days  ? ?Cherene Altes, MD ?[facility] ?Office  [phone] ?Pager - Text Page per Shea Evans ? ?If 7PM-7AM, please contact night-coverage per Amion ?07/05/2021, 9:27 AM ? ? ? ? ?

## 2021-07-05 NOTE — Progress Notes (Incomplete)
Physical Therapy Treatment ?Patient Details ?Name: Jenny Kelly ?MRN: [id number] ?DOB: April 03, 1946 ?[name]: 07/05/2021 ? ? ?History of Present Illness This 76 y.o. female admitted 3/11 with vaginal pain and fever.  Dx:  sepsis secondary to UTI.  PMH includes:  recent ureteral stent placment, COPD, seizures, sleep apnea, pulmonary fibrosis, recent Rt MCA CVA, h/o mild cognitive impairment, PAF, CAD, CKD stage III ? ?  ?PT Comments  ? ? Pt presented to PT with HPI above. Pt was woken up upon entering the room. Pt was excited to begin session and ready to get out of bed to walk. Pt stated that she has pain in her LUE. Pt was able to respond to instructions and verbal cues to get out of bed. Pt avoided use of LUE when performing tasks. Pt was able to roll to sidelying but assistance was needed when going from sidelying<>sit and from sit<>stand. Pt repeated directions that were given to complete tasks. Pt needed tactile cues when going from BSC<>standing. Pt was able to use walker to ambulate in room, but difficult to utilize due to pain in LUE. Pt needed verbal cues in order to complete pivot turns. Pt was left in recliner and would benefit from skilled PT in order to maximize independence and safety with mobility and ADLs in preparation for dc to next location.  ?  ?Recommendations for follow up therapy are one component of a multi-disciplinary discharge planning process, led by the attending physician.  Recommendations may be updated based on patient status, additional functional criteria and insurance authorization. ? ?Follow Up Recommendations ?   ?  ?  ?Assistance Recommended at Discharge    ?Patient can return home with the following   ?  ?Equipment Recommendations ?    ?  ?Recommendations for Other Services   ? ? ?  ?Precautions / Restrictions Precautions ?Precautions: Fall ?Precaution Comments: hx of left hemiparesis and CVA ?Restrictions ?Weight Bearing Restrictions: No  ?  ? ?Mobility ? Bed  Mobility ?Overal bed mobility: Needs Assistance ?Bed Mobility: Rolling, Sidelying to Sit ?Rolling: Min assist ?Sidelying to sit: Mod assist, +2 for physical assistance ?  ?  ?  ?General bed mobility comments:  (Assisted in ability to roll to sidelying and to sitting) ?  ? ?Transfers ?Overall transfer level: Needs assistance ?Equipment used: Rolling walker (2 wheels) ?Transfers: Sit to/from Stand, Bed to chair/wheelchair/BSC ?Sit to Stand: +2 physical assistance, Min assist (Pt needed tactile cues to initiate action of coming to EOB and bringing feet to floor.) ?Stand pivot transfers: Min assist ?Step pivot transfers: Min assist ?  ?  ?  ?General transfer comment: Assist to initiate standing and to place lt hand on walker. Bed to bsc with walker with min assist for balance and cues to keep stepping ?  ? ?Ambulation/Gait ?Ambulation/Gait assistance: Min assist, +2 safety/equipment ?Gait Distance (Feet): 10 Feet ?Assistive device: Rolling walker (2 wheels) ?Gait Pattern/deviations: Decreased step length - right, Decreased step length - left ?  ?  ?  ?General Gait Details: Assist for balance and support. Verbal cues to attend to task and to pivot and follow through with task. ? ? ?Stairs ?  ?  ?  ?  ?  ? ? ?Wheelchair Mobility ?  ? ?Modified Rankin (Stroke Patients Only) ?  ? ? ?  ?Balance Overall balance assessment: Mild deficits observed, not formally tested ?Sitting-balance support: Feet unsupported, No upper extremity supported ?Sitting balance-Leahy Scale: Fair ?  ?  ?Standing balance support: Single extremity  supported, Bilateral upper extremity supported (Supported LUE with PT's arm while standing and using walker) ?Standing balance-Leahy Scale: Poor ?Standing balance comment: PT provided support from behind and side when standing and pivtoing during walking tasks. ?  ?  ?  ?  ?  ?  ?  ?  ?  ?  ?  ?  ? ?  ?Cognition Arousal/Alertness: Awake/alert (Woken up upon entering session, tired and a bit drowsy) ?Behavior  During Therapy: [facility] for tasks assessed/performed ?Overall Cognitive Status: Impaired/Different from baseline ?Area of Impairment: Orientation, Attention, Memory, Safety/judgement, Following commands, Awareness (Pt repeated task phrases frequently) ?  ?  ?  ?  ?  ?  ?  ?  ?Orientation Level: Disoriented to, Place ?Current Attention Level: Sustained ?Memory: Decreased short-term memory ?Following Commands: Follows one step commands inconsistently ?Safety/Judgement: Decreased awareness of safety, Decreased awareness of deficits ?Awareness: Intellectual ?Problem Solving: Decreased initiation, Requires verbal cues, Requires tactile cues, Difficulty sequencing ?General Comments: Pt was able to hear directions for tasks, but often repeated herself; unsure if this is due to comprehension or cognitive deficit ?  ?  ? ?  ?Exercises   ? ?  ?General Comments General comments (skin integrity, edema, etc.): Pain in LUE that limited ability to use walker efficiently. ?  ?  ? ?Pertinent Vitals/Pain Pain Assessment ?Pain Assessment: Faces ?Faces Pain Scale: Hurts little more ?Breathing: normal ?Negative Vocalization: occasional moan/groan, low speech, negative/disapproving quality ?Facial Expression: smiling or inexpressive ?Body Language: tense, distressed pacing, fidgeting ?Consolability: distracted or reassured by voice/touch ?PAINAD Score: 3 ?Pain Location: LUE ?Pain Descriptors / Indicators: Discomfort, Guarding, Moaning, Grimacing ?Pain Intervention(s): Limited activity within patient's tolerance (Avoided weight bearing on LUE)  ? ? ?Home Living   ?  ?  ?  ?  ?  ?  ?  ?  ?  ?   ?  ?Prior Function    ?  ?  ?   ? ?PT Goals (current goals can now be found in the care plan section) Progress towards PT goals: Progressing toward goals ? ?  ?Frequency ? ? ?   ? ? ? ?  ?PT Plan    ? ? ?Co-evaluation   ?  ?  ?  ?  ? ?  ?AM-PAC PT "6 Clicks" Mobility   ?Outcome Measure ? Help needed turning from your back to your side while in a flat  bed without using bedrails?: A Lot ?Help needed moving from lying on your back to sitting on the side of a flat bed without using bedrails?: A Lot ?Help needed moving to and from a bed to a chair (including a wheelchair)?: A Lot ?Help needed standing up from a chair using your arms (e.g., wheelchair or bedside chair)?: A Little ?Help needed to walk in hospital room?: A Lot ?Help needed climbing 3-5 steps with a railing? : Total ?6 Click Score: 12 ? ?  ?End of Session Equipment Utilized During Treatment: Gait belt ?  ?Patient left: in chair;with call bell/phone within reach;with chair alarm set ?  ?  ?  ? ? ?Time: 1535-1601 ?PT Time Calculation (min) (ACUTE ONLY): 26 min ? ?Charges:  $Gait Training: 8-22 mins ?$Therapeutic Activity: 8-22 mins          ?          ? ?Liddy Deam, SPT ?Acute Rehab Services ?Office [phone]  ? ? ?Auri Jahnke ?07/05/2021, 4:57 PM ? ?

## 2021-07-05 NOTE — Discharge Instructions (Addendum)

## 2021-07-05 NOTE — TOC Progression Note (Signed)
Transition of Care (TOC) - Progression Note  ? ? ?Patient Details  ?Name: Jenny Kelly ?MRN: [id number] ?Date of Birth: 08-12-1945 ? ?Transition of Care (TOC) CM/SW Contact  ?Tom-Johnson, Hershal Coria, RN ?Phone Number: ?07/05/2021, 11:54 AM ? ?Clinical Narrative:    ? ?Patient continues IV abt treatment for 7 days. Complaining of persistent pain in her vagina since stent placement last hospitalization. Urology following. CM will continue to follow for needs. ? ?Expected Discharge Plan: Home w Home Health Services ?Barriers to Discharge: Continued Medical Work up ? ?Expected Discharge Plan and Services ?Expected Discharge Plan: Home w Home Health Services ?  ?Discharge Planning Services: CM Consult ?Post Acute Care Choice: Home Health ?Living arrangements for the past 2 months: Single Family Home ?                ?DME Arranged: N/A ?DME Agency: NA ?  ?  ?  ?HH Arranged: PT, OT ?HH Agency: [facility] ?Date HH Agency Contacted: 07/03/21 ?Time HH Agency Contacted: 1605 ?Representative spoke with at [facility] Agency: Kandee Keen ? ? ?Social Determinants of Health (SDOH) Interventions ?  ? ?Readmission Risk Interventions ?No flowsheet data found. ? ?

## 2021-07-05 NOTE — Progress Notes (Signed)
OT Cancellation Note ? ?Patient Details ?Name: Jenny Kelly ?MRN: [id number] ?DOB: 02/21/1946 ? ? ?Cancelled Treatment:    Reason Eval/Treat Not Completed: Patient at procedure or test/ unavailable.  Pt working with PT.  Will reattempt. ? ?[name], OTR/L ?Acute Rehabilitation Services ?Pager [phone] ?Office [phone] ? ? ?[name] ?07/05/2021, 3:51 PM ?

## 2021-07-06 DIAGNOSIS — N390 Urinary tract infection, site not specified: Secondary | ICD-10-CM | POA: Diagnosis not present

## 2021-07-06 DIAGNOSIS — A419 Sepsis, unspecified organism: Secondary | ICD-10-CM | POA: Diagnosis not present

## 2021-07-06 LAB — CULTURE, BLOOD (ROUTINE X 2)
Culture: NO GROWTH
Special Requests: ADEQUATE
Special Requests: ADEQUATE

## 2021-07-06 LAB — CBC
HCT: 30.2 % — ABNORMAL LOW (ref 36.0–46.0)
Hemoglobin: 9.5 g/dL — ABNORMAL LOW (ref 12.0–15.0)
MCH: 27.4 pg (ref 26.0–34.0)
MCHC: 31.5 g/dL (ref 30.0–36.0)
MCV: 87 fL (ref 80.0–100.0)
Platelets: 187 10*3/uL (ref 150–400)
RBC: 3.47 MIL/uL — ABNORMAL LOW (ref 3.87–5.11)
RDW: 16.2 % — ABNORMAL HIGH (ref 11.5–15.5)
WBC: 10.4 10*3/uL (ref 4.0–10.5)
nRBC: 0 % (ref 0.0–0.2)

## 2021-07-06 LAB — BASIC METABOLIC PANEL WITH GFR
Anion gap: 7 (ref 5–15)
BUN: 16 mg/dL (ref 8–23)
CO2: 23 mmol/L (ref 22–32)
Calcium: 8.1 mg/dL — ABNORMAL LOW (ref 8.9–10.3)
Chloride: 110 mmol/L (ref 98–111)
Creatinine, Ser: 1.5 mg/dL — ABNORMAL HIGH (ref 0.44–1.00)
GFR, Estimated: 36 mL/min — ABNORMAL LOW
Glucose, Bld: 119 mg/dL — ABNORMAL HIGH (ref 70–99)
Potassium: 3.3 mmol/L — ABNORMAL LOW (ref 3.5–5.1)
Sodium: 140 mmol/L (ref 135–145)

## 2021-07-06 LAB — RPR: RPR Ser Ql: NONREACTIVE

## 2021-07-06 LAB — VITAMIN B12: Vitamin B-12: 1543 pg/mL — ABNORMAL HIGH (ref 180–914)

## 2021-07-06 LAB — FOLATE: Folate: 18.5 ng/mL (ref >5.9)

## 2021-07-06 MED ORDER — DONEPEZIL HCL 10 MG PO TABS
10.0000 mg | ORAL_TABLET | Freq: Every day | ORAL | Status: DC
Start: 2021-07-06 — End: 2021-07-07
  Administered 2021-07-06: 10 mg via ORAL
  Filled 2021-07-06: qty 1

## 2021-07-06 MED ORDER — OXYCODONE HCL 5 MG PO TABS: 2.5000 mg | ORAL_TABLET | Freq: Four times a day (QID) | ORAL | Status: DC | PRN

## 2021-07-06 MED ORDER — SODIUM CHLORIDE 0.9 % IV SOLN
INTRAVENOUS | Status: DC
Start: 2021-07-06 — End: 2021-07-07

## 2021-07-06 MED ORDER — QUETIAPINE FUMARATE 25 MG PO TABS
12.5000 mg | ORAL_TABLET | Freq: Every day | ORAL | Status: DC
Start: 2021-07-06 — End: 2021-07-07
  Administered 2021-07-06: 12.5 mg via ORAL
  Filled 2021-07-06: qty 1

## 2021-07-06 NOTE — Progress Notes (Signed)
Occupational Therapy Treatment ?Patient Details ?Name: Jenny Kelly ?MRN: [id number] ?DOB: 09-07-1945 ?[name]: 07/06/2021 ? ? ?History of present illness This 76 y.o. female admitted 3/11 with vaginal pain and fever.  Dx:  sepsis secondary to UTI.  PMH includes:  recent ureteral stent placment, COPD, seizures, sleep apnea, pulmonary fibrosis, recent Rt MCA CVA, h/o mild cognitive impairment, PAF, CAD, CKD stage III ?  ?OT comments ? Pt demonstrates continued improvement with functional mobility and ability to perform ADLs.  She requires min guard assist - min A for functional mobility and mod A for ADLs.   ? ?Recommendations for follow up therapy are one component of a multi-disciplinary discharge planning process, led by the attending physician.  Recommendations may be updated based on patient status, additional functional criteria and insurance authorization. ?   ?Follow Up Recommendations ? Home health OT  ?  ?Assistance Recommended at Discharge Frequent or constant Supervision/Assistance  ?Patient can return home with the following ? A little help with walking and/or transfers;A lot of help with bathing/dressing/bathroom;Assistance with cooking/housework;Direct supervision/assist for medications management;Help with stairs or ramp for entrance;Assist for transportation;Direct supervision/assist for financial management ?  ?Equipment Recommendations ? None recommended by OT  ?  ?Recommendations for Other Services   ? ?  ?Precautions / Restrictions Precautions ?Precautions: Fall ?Precaution Comments: hx of left hemiparesis and CVA ?Restrictions ?Weight Bearing Restrictions: No  ? ? ?  ? ?Mobility Bed Mobility ?Overal bed mobility: Needs Assistance ?Bed Mobility: Sidelying to Sit, Sit to Supine, Rolling ?Rolling: Supervision ?Sidelying to sit: Min guard ?  ?Sit to supine: Min guard ?  ?  ?  ? ?Transfers ?Overall transfer level: Needs assistance ?Equipment used: Rolling walker (2 wheels), 1 person hand held  assist ?Transfers: Sit to/from Stand ?Sit to Stand: Min guard ?  ?  ?Step pivot transfers: Min guard ?  ?  ?General transfer comment: pt stood with min guard assist and was able to ambulate in room with min guard assist until she fatigued and experienced dizziness.  She then required min A for balance ?  ?  ?Balance Overall balance assessment: Needs assistance ?Sitting-balance support: Feet unsupported, No upper extremity supported ?Sitting balance-Leahy Scale: Good ?  ?  ?Standing balance support: Single extremity supported, Bilateral upper extremity supported, During functional activity ?Standing balance-Leahy Scale: Poor ?Standing balance comment: min guard assist ?  ?  ?  ?  ?  ?  ?  ?  ?  ?  ?  ?   ? ?ADL either performed or assessed with clinical judgement  ? ?ADL Overall ADL's : Needs assistance/impaired ?  ?  ?  ?  ?  ?  ?  ?  ?  ?  ?Lower Body Dressing: Moderate assistance;Sit to/from stand ?Lower Body Dressing Details (indicate cue type and reason): Pt able to don Lt sock with min A, max A for Rt sock.  Mod A to pull pants over hips (simulated) ?Toilet Transfer: Min guard;Cueing for sequencing;BSC/3in1;Rolling walker (2 wheels) ?  ?Toileting- Clothing Manipulation and Hygiene: Moderate assistance;Sit to/from stand ?  ?  ?  ?  ?  ?  ? ?Extremity/Trunk Assessment Upper Extremity Assessment ?Upper Extremity Assessment: Defer to OT evaluation ?LUE Deficits / Details: h/o stroke with residual weakness and neuropathic pain.  PROM of shoulder ~60*. Elbow distally WFL PROM ?LUE Coordination: decreased gross motor;decreased fine motor ?  ?Lower Extremity Assessment ?Lower Extremity Assessment: Difficult to assess due to impaired cognition ?  ?  ?  ? ?  Vision   ?  ?  ?Perception   ?  ?Praxis   ?  ? ?Cognition Arousal/Alertness: Awake/alert ?Behavior During Therapy: [facility] for tasks assessed/performed ?Overall Cognitive Status: No family/caregiver present to determine baseline cognitive functioning ?  ?  ?  ?  ?  ?  ?  ?   ?  ?  ?  ?  ?  ?  ?  ?  ?General Comments: pt interactive and appropriate.  She follows commands consistently.  She requires min cues for safety and awareness ?  ?  ?   ?Exercises   ? ?  ?Shoulder Instructions   ? ? ?  ?General Comments    ? ? ?Pertinent Vitals/ Pain       Pain Assessment ?Pain Assessment: Faces ?Faces Pain Scale: Hurts little more ?Pain Location: L UE ?Pain Descriptors / Indicators: Discomfort, Guarding, Moaning, Grimacing ?Pain Intervention(s): Monitored during session ? ?Home Living   ?  ?  ?  ?  ?  ?  ?  ?  ?  ?  ?  ?  ?  ?  ?  ?  ?  ?  ? ?  ?Prior Functioning/Environment    ?  ?  ?  ?   ? ?Frequency ? Min 2X/week  ? ? ? ? ?  ?Progress Toward Goals ? ?OT Goals(current goals can now be found in the care plan section) ? Progress towards OT goals: Progressing toward goals ? ?   ?Plan Discharge plan remains appropriate   ? ?Co-evaluation ? ? ?   ?  ?  ?  ?  ? ?  ?AM-PAC OT "6 Clicks" Daily Activity     ?Outcome Measure ? ? Help from another person eating meals?: A Little ?Help from another person taking care of personal grooming?: A Little ?Help from another person toileting, which includes using toliet, bedpan, or urinal?: A Lot ?Help from another person bathing (including washing, rinsing, drying)?: A Lot ?Help from another person to put on and taking off regular upper body clothing?: A Little ?Help from another person to put on and taking off regular lower body clothing?: A Lot ?6 Click Score: 15 ? ?  ?End of Session   ? ?OT Visit Diagnosis: Unsteadiness on feet (R26.81);Cognitive communication deficit (R41.841);Pain;Muscle weakness (generalized) (M62.81);Hemiplegia and hemiparesis ?Hemiplegia - Right/Left: Left ?Hemiplegia - dominant/non-dominant: Non-Dominant ?Hemiplegia - caused by: Cerebral infarction ?Pain - Right/Left: Left ?Pain - part of body: Arm ?  ?Activity Tolerance Patient tolerated treatment well ?  ?Patient Left in bed;with call bell/phone within reach;with bed alarm set ?  ?Nurse  Communication Mobility status ?  ? ?   ? ?Time: [id number] ?OT Time Calculation (min): 18 min ? ?Charges: OT General Charges ?$OT Visit: 1 Visit ?OT Treatments ?$Therapeutic Activity: 8-22 mins ? ?Adisyn Ruscitti C., OTR/L ?Acute Rehabilitation Services ?Pager [phone] ?Office [phone] ? ? ?[name] ?07/06/2021, 4:05 PM ?

## 2021-07-06 NOTE — Progress Notes (Signed)
? ?Jenny Kelly  MRN:[id number] DOB: 05/25/1945 DOA: 07/01/2021 ?PCP: Van Eyk, Jason, MD   ? ?Brief Narrative:  ?76-year-old with a history of atrial fibrillation, chronic diastolic CHF, COPD, DM 2, HLD, OSA, dementia, CVA, hypothyroidism, and seizure disorder who presented to the ED with a fever.  She had been seen at an outside hospital the day prior, diagnosed with UTI, and discharged with oral antibiotics.  She was admitted to [facility] January 9-15/2023 for acute ischemic stroke along with septic shock from pyelonephritis requiring left ureteral stenting, and required admission to CIR afterwards.  She complained of perineal pain which she blamed on the urinary stent. ? ?Consultants:  ?Urology ? ?Code Status: NO CODE BLUE ? ?DVT prophylaxis: ?Xarelto ? ?Interim Hx: ?No acute events reported overnight. Afeb.  The patient appears to be resting comfortably and herself has no new complaints.  Her husband is at bedside and reports that the patient has suffered an acute change in her mental status which she first observed 07/03/2021 while inpatient.  It has not worsened but has not improved since that time.  He describes this as significant confusion beyond her baseline dementia that she experiences at home. ? ?Assessment & Plan: ? ?Sepsis due to complicated E. coli UTI ?Met sepsis criteria due to leukocytosis, tachycardia, and tachypnea in the setting of a bacterial infection -known obstructing stone status post recent stent -continue IV antibiotic for now - transition to Keflex at time of d/c for extended tx course as her organism is only intermediately sensitive to Cipro ? ?Obstructing proximal right ureteral urinary stone status post stent placement January 2023 ?At time of stent placement patient was in septic shock with pyelonephritis -complains of persistent pain in the perineum since stent placement -  Urology consulted -Dr. Winter planning definitive treatment when medically cleared as outpatient -procedure  being delayed 2 months given recent stroke ? ?Acute delirium on chronic dementia  ?Onset 07/03/21 during admission - likely sundowning in setting of dementia exacerbated by holding of aricept due to concern for prolonged QTc - CT head 07/04/21 w/o acute findings - resume aricept - check B12 and folic acid and ammonia - low dose seroquel tonight - delay planned d/c  ? ?Prolonged QTc ?Careful consideration of medical therapies ? ?Hypokalemia ?Cont to supplement -felt to be due to poor intake ? ?Seizure disorder ?Continue usual Topamax ? ?COPD - ?Pulmonary fibrosis - chronic hypoxic respiratory failure ?Appearance of the lung bases on CT abdomen suggests substantial chronic postinfectious or inflammatory scarring - requires 2 L nasal cannula O2 chronically -continue usual inhaled medications - consider outpt Pulm f/u  ? ?Evidence of chronic pancreatitis on CT abdom  ?Asymptomatic  ? 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 ?PAF ?Does not require rate controlling medications at baseline -continue Xarelto ? ?CAD status post stenting ?Continue ASA ? ?HLD ?Continue Pravachol ? ?Hypothyroidism ?Continue Synthroid ? ?CKD stage IIIb ?Renal function stable presently ? ?Recent Labs  ?Lab 07/01/21 ?0335 07/02/21 ?0111 07/04/21 ?0654 07/05/21 ?0306 07/06/21 ?0300  ?CREATININE 1.49* 1.35* 1.26* 1.20* 1.50*  ? ? ? ?Family Communication: Spoke with husband at bedside at length ?Disposition: From home - Home health OT and PT recommended -plan for discharge 3/17 if delirium stable/improved ? ?Objective: ?Blood pressure 123/63, pulse 89, temperature 99 ?F (37.2 ?C), temperature source Oral, resp. rate 18, height 5' 2" (1.575 m), weight 68.2 kg, SpO2 95 %. ? ?  Intake/Output Summary (Last 24 hours) at 07/06/2021 0737 ?Last data filed at 07/06/2021 0600 ?Gross per 24 hour  ?Intake 820 ml  ?Output 800 ml  ?Net 20 ml   ? ? ?Filed Weights  ? 07/01/21 1737  ?Weight: 68.2 kg  ? ? ?Examination: ?General: No acute respiratory distress ?Lungs: Mild bibasilar crackles with no wheezing ?Cardiovascular: Regular rate without murmur or rub ?Abdomen: NT/ND, soft, BS positive ?Extremities: No significant cyanosis, clubbing, or edema bilateral lower extremities ? ?CBC: ?Recent Labs  ?Lab 07/04/21 ?0654 07/05/21 ?0306 07/06/21 ?0300  ?WBC 10.9* 9.8 10.4  ?HGB 9.7* 9.7* 9.5*  ?HCT 32.6* 31.8* 30.2*  ?MCV 90.6 89.1 87.0  ?PLT 177 163 187  ? ? ?Basic Metabolic Panel: ?Recent Labs  ?Lab 07/04/21 ?0654 07/05/21 ?0306 07/06/21 ?0300  ?NA 140 141 140  ?K 3.8 3.4* 3.3*  ?CL 112* 111 110  ?CO2 24 22 23  ?GLUCOSE 104* 92 119*  ?BUN 14 15 16  ?CREATININE 1.26* 1.20* 1.50*  ?CALCIUM 8.5* 8.2* 8.1*  ? ? ?GFR: ?Estimated Creatinine Clearance: 29.3 mL/min (A) (by C-G formula based on SCr of 1.5 mg/dL (H)). ? ?Liver Function Tests: ?Recent Labs  ?Lab 07/01/21 ?0335  ?AST 14*  ?ALT 6  ?ALKPHOS 71  ?BILITOT 0.1*  ?PROT 6.5  ?ALBUMIN 2.5*  ? ? ? ?Coagulation Profile: ?Recent Labs  ?Lab 07/01/21 ?0335  ?INR 1.4*  ? ? ? ?HbA1C: ?Hgb A1c MFr Bld  ?Date/Time Value Ref Range Status  ?05/03/2021 05:38 AM 5.9 (H) 4.8 - 5.6 % Final  ?  Comment:  ?  (NOTE) ?Pre diabetes:          5.7%-6.4% ? ?Diabetes:              >6.4% ? ?Glycemic control for   <7.0% ?adults with diabetes ?  ? ? ?Scheduled Meds: ? aspirin EC  81 mg Oral Daily  ? budesonide  0.5 mg Nebulization BID  ? famotidine  20 mg Oral Daily  ? levothyroxine  88 mcg Oral QAC breakfast  ? mouth rinse  15 mL Mouth Rinse BID  ? montelukast  10 mg Oral QHS  ? oxybutynin  2.5 mg Oral TID  ? pantoprazole  40 mg Oral Daily  ? pravastatin  40 mg Oral q1800  ? pregabalin  50 mg Oral BID  ? rivaroxaban  15 mg Oral Q supper  ? sodium chloride flush  3 mL Intravenous Q12H  ? topiramate  200 mg Oral BID  ? ?Continuous Infusions: ? cefTRIAXone (ROCEPHIN)  IV 1 g (07/05/21 1041)  ? ? ? LOS: 5 days  ? ? T. , MD ?[facility] ?Office  [phone] ?Pager - Text Page per Amion ? ?If 7PM-7AM, please contact night-coverage per Amion ?07/06/2021, 7:37 AM ? ? ? ? ?

## 2021-07-06 NOTE — Progress Notes (Signed)
ANTICOAGULATION CONSULT NOTE ? ?Pharmacy Consult for Rivaroxaban ?Indication: atrial fibrillation ? ?Allergies  ?Allergen Reactions  ? Ciprofloxacin Hives  ?  Ask patient  ? Prochlorperazine Edisylate Other (See Comments)  ?  Cant swallow ?Muscle cramping  ? Prednisone Other (See Comments)  ?  unknown  ? Reduced Iso-Alpha Acids Complex Other (See Comments)  ?  unknown  ? Statins Other (See Comments)  ?  Muscle weakness ?  ? Sulfa Antibiotics Swelling  ? Azithromycin Rash  ? Latex Rash  ? ? ?Patient Measurements: ?Height: 5\' 2"  (157.5 cm) ?Weight: 68.2 kg (150 lb 5.7 oz) ?IBW/kg (Calculated) : 50.1 ?Heparin Dosing Weight: 65 kg ? ?Vital Signs: ?Temp: 99 ?F (37.2 ?C) (03/16 0535) ?Temp Source: Oral (03/16 0535) ?BP: 123/63 (03/16 0535) ?Pulse Rate: 89 (03/16 0535) ? ?Labs: ?Recent Labs  ?  07/04/21 ?0654 07/05/21 ?0306 07/06/21 ?0300  ?HGB 9.7* 9.7* 9.5*  ?HCT 32.6* 31.8* 30.2*  ?PLT 177 163 187  ?CREATININE 1.26* 1.20* 1.50*  ? ? ? ?Estimated Creatinine Clearance: 29.3 mL/min (A) (by C-G formula based on SCr of 1.5 mg/dL (H)). ? ? ?Medical History: ?Past Medical History:  ?Diagnosis Date  ? Anemia   ? Anxiety   ? Arthritis   ? Asthma   ? Atrial fibrillation ([place])   ? Blood transfusion without reported diagnosis   ? Cataract   ? CHF (congestive heart failure) ([place])   ? Chronic kidney disease   ? Clotting disorder ([place])   ? COPD (chronic obstructive pulmonary disease) ([place])   ? Depression   ? Diabetes mellitus without complication ([place])   ? GERD (gastroesophageal reflux disease)   ? Heart murmur   ? Hyperlipidemia   ? Myocardial infarction [facility])   ? Neuromuscular disorder ([place])   ? tremors  ? Seizures ([place])   ? Sleep apnea   ? Stroke [facility])   ? Thyroid disease   ? ? ?Medications:  ?Medications Prior to Admission  ?Medication Sig Dispense Refill Last Dose  ? aspirin 81 MG EC tablet Take 1 tablet (81 mg total) by mouth daily. Swallow whole. 30 tablet 0 06/30/2021  ? budesonide (PULMICORT) 0.5 MG/2ML nebulizer solution Take  2 mLs (0.5 mg total) by nebulization in the morning and at bedtime. 120 mL 0 06/30/2021  ? donepezil (ARICEPT) 10 MG tablet Take 1 tablet (10 mg total) by mouth at bedtime. 30 tablet 0 06/30/2021  ? HYDROcodone-acetaminophen (NORCO/VICODIN) 5-325 MG tablet Take 1 tablet by mouth every 6 (six) hours as needed for moderate pain.   06/30/2021  ? levofloxacin (LEVAQUIN) 500 MG tablet Take 500 mg by mouth daily.   06/30/2021  ? levothyroxine (SYNTHROID) 88 MCG tablet Take 88 mcg by mouth daily before breakfast.   06/30/2021  ? montelukast (SINGULAIR) 10 MG tablet Take 1 tablet (10 mg total) by mouth at bedtime. 30 tablet 0 06/30/2021  ? Multiple Vitamin (MULTIVITAMIN WITH MINERALS) TABS tablet Take 1 tablet by mouth daily.   06/30/2021  ? omeprazole (PRILOSEC) 40 MG capsule Take 1 capsule (40 mg total) by mouth daily. 30 capsule 0 06/30/2021  ? Phenazopyridine HCl (URISTAT PO) Take 1 tablet by mouth as needed (UTI).   Past Week  ? pravastatin (PRAVACHOL) 40 MG tablet Take 1 tablet (40 mg total) by mouth daily at 6 PM. 30 tablet 0 06/30/2021  ? pregabalin (LYRICA) 50 MG capsule TAKE 1 CAPSULE BY MOUTH 2 TIMES DAILY. (Patient taking differently: Take 50 mg by mouth 2 (two) times daily.) 60 capsule 0  06/30/2021  ? Rivaroxaban (XARELTO) 15 MG TABS tablet Take 1 tablet (15 mg total) by mouth daily. 90 tablet 0 06/30/2021 at 0900  ? topiramate (TOPAMAX) 200 MG tablet Take 1 tablet (200 mg total) by mouth 2 (two) times daily. 60 tablet 0 06/30/2021  ? albuterol (PROVENTIL HFA) 108 (90 Base) MCG/ACT inhaler Inhale two puffs every 4-6 hours if needed for coughing, wheezing, or shortness of breath. (Patient not taking: Reported on 07/01/2021) 1 each 1 Not Taking  ? ANORO ELLIPTA 62.5-25 MCG/ACT AEPB TAKE 1 PUFF BY MOUTH EVERY DAY (Patient not taking: Reported on 07/01/2021) 60 each 1 Not Taking  ? azelastine (ASTELIN) 0.1 % nasal spray Use one spray in each nostril twice daily (Patient not taking: Reported on 07/01/2021) 30 mL 5 Not Taking  ?  butalbital-acetaminophen-caffeine (FIORICET) 50-325-40 MG tablet Take 1 tablet by mouth daily as needed for migraine. (Patient not taking: Reported on 07/01/2021) 5 tablet 0 Completed Course  ? famotidine (PEPCID) 20 MG tablet Take one tablet once daily (Patient not taking: Reported on 07/01/2021) 30 tablet 0 Not Taking  ? fluticasone (FLONASE) 50 MCG/ACT nasal spray SPRAY 1 SPRAY INTO BOTH NOSTRILS DAILY. (Patient not taking: Reported on 07/01/2021) 48 mL 2 Not Taking  ? ipratropium (ATROVENT) 0.03 % nasal spray SPRAY 2 SPRAYS INTO BOTH NOSTRILS EVERY 12 HOURS. (Patient not taking: Reported on 07/01/2021) 90 mL 3 Not Taking  ? lipase/protease/amylase (CREON) 12000-38000 units CPEP capsule Take 1 capsule (12,000 Units total) by mouth 3 (three) times daily before meals. (Patient not taking: Reported on 07/01/2021) 270 capsule 0 Not Taking  ? ? ?Scheduled:  ? aspirin EC  81 mg Oral Daily  ? budesonide  0.5 mg Nebulization BID  ? famotidine  20 mg Oral Daily  ? levothyroxine  88 mcg Oral QAC breakfast  ? mouth rinse  15 mL Mouth Rinse BID  ? montelukast  10 mg Oral QHS  ? oxybutynin  2.5 mg Oral TID  ? pantoprazole  40 mg Oral Daily  ? pravastatin  40 mg Oral q1800  ? pregabalin  50 mg Oral BID  ? rivaroxaban  15 mg Oral Q supper  ? sodium chloride flush  3 mL Intravenous Q12H  ? topiramate  200 mg Oral BID  ? ?Infusions:  ? cefTRIAXone (ROCEPHIN)  IV 1 g (07/05/21 1041)  ? ?PRN: acetaminophen **OR** [DISCONTINUED] acetaminophen, oxyCODONE ? ?Assessment: ?57 yof with a history of afib on Xarelto; HF; COPD; DM; HLD; OSA; CVA; dementia; hypothyroidism; and seizures. Admission 1/9-15 for AIS & septic shock from pyelo/L renal calculus s/p L ureteral stenting. Patient is presenting with fever.  ? ?Patient is on xarelto prior to arrival. Decision made to hold pending urology plan for stent. Last dose 3/10 0900.  ? ?Per Dr. Eliseo Squires, as no urology procedure is  planned, it is OK to transition from heparin gtt to [place].  ? ?Goal of  Therapy:  ?Monitor platelets by anticoagulation protocol: Yes ?  ?Plan:  ? ?- Continue Xarelto 15 mg PO with supper (CrCl 15-50 mL/min) ?- Pharmacy will sign off ? ?Quinetta Shilling A. Levada Dy, PharmD, BCPS, FNKF ?Clinical Pharmacist ?[place] ?Please utilize Amion for appropriate phone number to reach the unit pharmacist ([place]) ? ? ? ?

## 2021-07-06 NOTE — Progress Notes (Signed)
Physical Therapy Treatment ?Patient Details ?Name: Jenny Kelly ?MRN: [id number] ?DOB: December 02, 1945 ?[name]: 07/06/2021 ? ? ?History of Present Illness This 76 y.o. female admitted 3/11 with vaginal pain and fever.  Dx:  sepsis secondary to UTI.  PMH includes:  recent ureteral stent placment, COPD, seizures, sleep apnea, pulmonary fibrosis, recent Rt MCA CVA, h/o mild cognitive impairment, PAF, CAD, CKD stage III ? ?  ?PT Comments  ? ? Patient requiring less physical assistance this date but continues to require assistance for processing and sequencing. Patient requiring minA for bed mobility and x3 sit to stand and step pivot transfers this date. Patient will likely need chair following to progress ambulation due to weakness and difficulty following commands. Patient continues to complain of L UE pain with slight movement. D/c plan remains appropriate as long as husband is able to provide necessary assistance.  ?   ?Recommendations for follow up therapy are one component of a multi-disciplinary discharge planning process, led by the attending physician.  Recommendations may be updated based on patient status, additional functional criteria and insurance authorization. ? ?Follow Up Recommendations ? Home health PT ?  ?  ?Assistance Recommended at Discharge Frequent or constant Supervision/Assistance  ?Patient can return home with the following A little help with walking and/or transfers;Help with stairs or ramp for entrance;A little help with bathing/dressing/bathroom ?  ?Equipment Recommendations ? None recommended by PT  ?  ?Recommendations for Other Services   ? ? ?  ?Precautions / Restrictions Precautions ?Precautions: Fall ?Precaution Comments: hx of left hemiparesis and CVA ?Restrictions ?Weight Bearing Restrictions: No  ?  ? ?Mobility ? Bed Mobility ?Overal bed mobility: Needs Assistance ?Bed Mobility: Supine to Sit ?  ?  ?Supine to sit: Min assist ?  ?  ?General bed mobility comments: repetitive commands  for patient to follow. MinA for trunk elevation ?  ? ?Transfers ?Overall transfer level: Needs assistance ?Equipment used: Rolling Orie Cuttino (2 wheels), 1 person hand held assist ?Transfers: Sit to/from Stand, Bed to chair/wheelchair/BSC ?Sit to Stand: Min assist ?  ?Step pivot transfers: Min assist ?  ?  ?  ?General transfer comment: Assist to initiate standing and hand placement on RW. Transferred to recliner with minA for balance and RW management. Patient then requesting to urinate. With increased time to process commands, patient able to transfer to [facility] with minA. Repetitive cueing to "sit" once in front of recliner or BSC. Returned to chair at end of session ?  ? ?Ambulation/Gait ?  ?  ?  ?  ?  ?  ?  ?General Gait Details: deferred due to lack of +2 for safety ? ? ?Stairs ?  ?  ?  ?  ?  ? ? ?Wheelchair Mobility ?  ? ?Modified Rankin (Stroke Patients Only) ?  ? ? ?  ?Balance Overall balance assessment: Needs assistance ?Sitting-balance support: Feet unsupported, No upper extremity supported ?Sitting balance-Leahy Scale: Fair ?  ?  ?Standing balance support: Single extremity supported, Bilateral upper extremity supported, During functional activity ?Standing balance-Leahy Scale: Poor ?Standing balance comment: minA for transfers ?  ?  ?  ?  ?  ?  ?  ?  ?  ?  ?  ?  ? ?  ?Cognition Arousal/Alertness: Awake/alert ?Behavior During Therapy: [facility] for tasks assessed/performed ?Overall Cognitive Status: Impaired/Different from baseline ?Area of Impairment: Orientation, Attention, Memory, Safety/judgement, Following commands, Awareness ?  ?  ?  ?  ?  ?  ?  ?  ?Orientation Level: Disoriented  to, Place ?Current Attention Level: Sustained ?Memory: Decreased short-term memory ?Following Commands: Follows one step commands inconsistently ?Safety/Judgement: Decreased awareness of safety, Decreased awareness of deficits ?Awareness: Intellectual ?Problem Solving: Decreased initiation, Requires verbal cues, Requires tactile cues,  Difficulty sequencing ?General Comments: patient perseverating on answers to questions and continuously repeated self. Patient difficulty following commands but does respond better to direct 1 step commands ?  ?  ? ?  ?Exercises   ? ?  ?General Comments   ?  ?  ? ?Pertinent Vitals/Pain Pain Assessment ?Pain Assessment: Faces ?Faces Pain Scale: Hurts little more ?Pain Location: L UE ?Pain Descriptors / Indicators: Discomfort, Guarding, Moaning, Grimacing ?Pain Intervention(s): Monitored during session  ? ? ?Home Living   ?  ?  ?  ?  ?  ?  ?  ?  ?  ?   ?  ?Prior Function    ?  ?  ?   ? ?PT Goals (current goals can now be found in the care plan section) Acute Rehab PT Goals ?Patient Stated Goal: did not state ?PT Goal Formulation: Patient unable to participate in goal setting ?Time For Goal Achievement: 07/17/21 ?Potential to Achieve Goals: Fair ?Progress towards PT goals: Progressing toward goals ? ?  ?Frequency ? ? ? Min 3X/week ? ? ? ?  ?PT Plan Current plan remains appropriate  ? ? ?Co-evaluation   ?  ?  ?  ?  ? ?  ?AM-PAC PT "6 Clicks" Mobility   ?Outcome Measure ? Help needed turning from your back to your side while in a flat bed without using bedrails?: A Little ?Help needed moving from lying on your back to sitting on the side of a flat bed without using bedrails?: A Little ?Help needed moving to and from a bed to a chair (including a wheelchair)?: A Little ?Help needed standing up from a chair using your arms (e.g., wheelchair or bedside chair)?: A Little ?Help needed to walk in hospital room?: A Lot ?Help needed climbing 3-5 steps with a railing? : Total ?6 Click Score: 15 ? ?  ?End of Session Equipment Utilized During Treatment: Gait belt ?Activity Tolerance: Patient tolerated treatment well ?Patient left: in chair;with call bell/phone within reach;with chair alarm set ?Nurse Communication: Mobility status ?PT Visit Diagnosis: Unsteadiness on feet (R26.81);Other abnormalities of gait and mobility (R26.89) ?   ? ? ?Time: [id number] ?PT Time Calculation (min) (ACUTE ONLY): 25 min ? ?Charges:  $Therapeutic Activity: 23-37 mins          ?          ? ?Alizae Bechtel A. Gilford Rile, PT, DPT ?Acute Rehabilitation Services ?Pager [phone] ?Office [phone] ? ? ? ?Niamya Vittitow A Keari Miu ?07/06/2021, 2:44 PM ? ?

## 2021-07-06 NOTE — Plan of Care (Signed)
  Problem: Safety: Goal: Ability to remain free from injury will improve Outcome: Progressing   

## 2021-07-06 NOTE — Plan of Care (Signed)

## 2021-07-07 LAB — BASIC METABOLIC PANEL
Anion gap: 6 (ref 5–15)
BUN: 15 mg/dL (ref 8–23)
CO2: 20 mmol/L — ABNORMAL LOW (ref 22–32)
Calcium: 8 mg/dL — ABNORMAL LOW (ref 8.9–10.3)
Chloride: 114 mmol/L — ABNORMAL HIGH (ref 98–111)
Creatinine, Ser: 1.2 mg/dL — ABNORMAL HIGH (ref 0.44–1.00)
GFR, Estimated: 47 mL/min — ABNORMAL LOW (ref >60)
Glucose, Bld: 85 mg/dL (ref 70–99)
Potassium: 3.4 mmol/L — ABNORMAL LOW (ref 3.5–5.1)
Sodium: 140 mmol/L (ref 135–145)

## 2021-07-07 LAB — AMMONIA: Ammonia: 31 umol/L (ref 9–35)

## 2021-07-07 MED ORDER — BISACODYL 10 MG RE SUPP
10.0000 mg | Freq: Once | RECTAL | Status: AC
  Administered 2021-07-07: 10 mg via RECTAL
  Filled 2021-07-07: qty 1

## 2021-07-07 MED ORDER — ACETAMINOPHEN 325 MG PO TABS: 650.0000 mg | ORAL_TABLET | Freq: Four times a day (QID) | ORAL | Status: DC | PRN

## 2021-07-07 MED ORDER — OXYBUTYNIN CHLORIDE 5 MG PO TABS: 2.5000 mg | ORAL_TABLET | Freq: Three times a day (TID) | ORAL | 0 refills | Status: DC

## 2021-07-07 MED ORDER — CEPHALEXIN 500 MG PO CAPS: 500.0000 mg | ORAL_CAPSULE | Freq: Two times a day (BID) | ORAL | 0 refills | Status: AC

## 2021-07-07 NOTE — Discharge Summary (Signed)
?DISCHARGE SUMMARY ? ?Jenny Kelly ? ?MR#: [id number] ? ?DOB:March 17, 1946  ?Date of Admission: 07/01/2021 ?Date of Discharge: 07/07/2021 ? ?Attending Physician:Jenny Sakata Hennie Duos, MD ? ?Patient's PCP:Jenny Wendie Chess, MD ? ?Consults: Urology  ? ?Disposition: D/C home  ? ?Follow-up Appts: ? Follow-up Information   ? ? Care, [facility] Follow up.   ?Specialty: Home Health Services ?Why: Someone will call to schedule first home visit. ?Contact information: ?[address] ?[address] ?[place] [address] ?[phone] ? ? ?  ?  ? ? Jenny Kelly, Jenny Cornea, MD. Schedule an appointment as soon as possible for a visit in 7 day(s).   ?Specialty: Internal Medicine ?Contact information: ?Jenny Kelly ?[address] ?[place] [address] ?[phone] ? ? ?  ?  ? ? Jenny Mons, MD. Call.   ?Specialty: Urology ?Why: Call to arrange a follow up appointment if you do not already have a scheduled appointment. ?Contact information: ?[address] ?[address] ?[place] [address] ?[phone] ? ? ?  ?  ? ?  ?  ? ?  ? ? ?Discharge Diagnoses: ?Sepsis due to complicated E. 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  ?Initial presentation: ?76 year old with a history of atrial fibrillation, chronic diastolic CHF, COPD, DM 2, HLD, OSA, dementia, CVA, hypothyroidism, and seizure disorder who presented to the ED with a fever.  She had been seen at an outside hospital the day prior, diagnosed with UTI, and discharged with oral antibiotics.  She was admitted to [facility] January 9-15/2023 for acute ischemic stroke along with septic shock from pyelonephritis requiring left ureteral  stenting, and required admission to CIR afterwards.  She complained of perineal pain which she blamed on the urinary stent. ? ?Hospital Course: ? ?Sepsis due to complicated E. coli UTI ?Met sepsis criteria due to leukocytosis, tachycardia, and tachypnea in the setting of a bacterial infection - known obstructing stone status post recent stent -continued IV antibiotic during hospital admission - transitioned to Keflex at time of d/c for extended tx course as her organism is only intermediately sensitive to Cipro ?  ?Obstructing proximal right ureteral urinary stone status post stent placement January 2023 ?At time of stent placement patient was in septic shock with pyelonephritis -complains of persistent pain in the perineum since stent placement -  Urology consulted - Dr. Lovena Neighbours planning definitive treatment when medically cleared as outpatient -procedure being delayed 2 months given recent stroke - urinary sx improved w/ use of ditropan  ?  ?Acute delirium on chronic dementia  ?Onset 07/03/21 during admission - likely sundowning in setting of dementia exacerbated by holding of aricept due to concern for prolonged QTc - CT head 07/04/21 w/o acute findings - resumed aricept - E52 and folic acid and ammonia not abnormal - low dose seroquel utilized 3/16 night - delayed planned d/c to assure stable - long discussion w/ husband explaining that d/c home will be best intervention to improve her mental status  ?  ?Prolonged QTc ?Careful consideration given to all medical therapies ?  ?Hypokalemia ?Supplemented during this admission -felt to be due to poor intake ?  ?Seizure disorder ?Continue usual Topamax ?  ?COPD - ?Pulmonary fibrosis - chronic hypoxic  respiratory failure ?Appearance of the lung bases on CT abdomen suggests substantial chronic postinfectious or inflammatory scarring - requires 2 L nasal cannula O2 chronically -continue usual inhaled medications - consider outpt Pulm f/u if patient remains medically stable  otherwise  ?  ?Evidence of chronic pancreatitis on CT abdom  ?Asymptomatic  ?  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  ?PAF ?Does not require rate controlling medications at baseline -continue Xarelto ?  ?CAD status post stenting ?Continue ASA - asymptomatic during this admssion  ?  ?HLD ?Continue Pravachol ?  ?Hypothyroidism ?Continue Synthroid ?  ?CKD stage IIIb ?Renal function stable at time of d/c  ?  ?  ?Allergies as of 07/07/2021   ? ?   Reactions  ? Ciprofloxacin Hives  ? Ask patient  ? Prochlorperazine Edisylate Other (See Comments)  ? Cant swallow ?Muscle cramping  ? Prednisone Other (See Comments)  ? unknown  ? Reduced Iso-alpha Acids Complex Other (See Comments)  ? unknown  ? Statins Other (See Comments)  ? Muscle weakness  ? Sulfa Antibiotics Swelling  ? Azithromycin Rash  ? Latex Rash  ? ?  ? ?  ?Medication List  ?  ? ?STOP taking these medications   ? ?albuterol 108 (90 Base) MCG/ACT inhaler ?Commonly known as: Proventil HFA ?  ?Anoro Ellipta 62.5-25 MCG/ACT Aepb ?Generic drug: umeclidinium-vilanterol ?  ?azelastine 0.1 % nasal spray ?Commonly known as: ASTELIN ?  ?butalbital-acetaminophen-caffeine 50-325-40 MG tablet ?Commonly known as: FIORICET ?  ?famotidine 20 MG tablet ?Commonly known as: PEPCID ?  ?fluticasone 50 MCG/ACT nasal spray ?Commonly known as: FLONASE ?  ?ipratropium 0.03 % nasal spray ?Commonly known as: ATROVENT ?  ?levofloxacin 500 MG tablet ?Commonly known as: LEVAQUIN ?  ?lipase/protease/amylase 12000-38000 units Cpep capsule ?Commonly known as: CREON ?  ?URISTAT PO ?  ? ?  ? ?TAKE these medications   ? ?acetaminophen 325 MG tablet ?Commonly known as: TYLENOL ?Take 2 tablets (650 mg total) by mouth every 6 (six) hours as needed for mild pain (or Fever >/= 101). ?  ?aspirin 81 MG EC tablet ?Take 1 tablet (81 mg total) by mouth daily. Swallow whole. ?  ?budesonide  0.5 MG/2ML nebulizer solution ?Commonly known as: PULMICORT ?Take 2 mLs (0.5 mg total) by nebulization in the morning and at bedtime. ?  ?cephALEXin 500 MG capsule ?Commonly known as: KEFLEX ?Take 1 capsule (500 mg total) by mouth 2 (two) times daily for 7 days. ?  ?donepezil 10 MG tablet ?Commonly known as: ARICEPT ?Take 1 tablet (10 mg total) by mouth at bedtime. ?  ?HYDROcodone-acetaminophen 5-325 MG tablet ?Commonly known as: NORCO/VICODIN ?Take 1 tablet by mouth every 6 (six) hours as needed for moderate pain. ?  ?levothyroxine 88 MCG tablet ?Commonly known as: SYNTHROID ?Take 88 mcg by mouth daily before breakfast. ?  ?montelukast 10 MG tablet ?Commonly known as: SINGULAIR ?Take 1 tablet (10 mg total) by mouth at bedtime. ?  ?multivitamin with minerals Tabs tablet ?Take 1 tablet by mouth daily. ?  ?omeprazole 40 MG capsule ?Commonly known as: PRILOSEC ?Take 1 capsule (40 mg total) by mouth daily. ?  ?oxybutynin 5 MG tablet ?Commonly known as: DITROPAN ?Take 0.5 tablets (2.5 mg total) by mouth 3 (three) times daily. ?  ?pravastatin 40 MG tablet ?Commonly known as: PRAVACHOL ?Take 1 tablet (40 mg total) by mouth daily at 6 PM. ?  ?pregabalin 50  MG capsule ?Commonly known as: LYRICA ?TAKE 1 CAPSULE BY MOUTH 2 TIMES DAILY. ?  ?Rivaroxaban 15 MG Tabs tablet ?Commonly known as: XARELTO ?Take 1 tablet (15 mg total) by mouth daily. ?  ?topiramate 200 MG tablet ?Commonly known as: TOPAMAX ?Take 1 tablet (200 mg total) by mouth 2 (two) times daily. ?  ? ?  ? ? ?Day of Discharge ?BP (!) 135/57 (BP Location: Right Arm)   Pulse 69   Temp 98.4 ?F (36.9 ?C) (Oral)   Resp 16   Ht $R'5\' 2"'CY$  (1.575 m)   Wt 68.2 kg   SpO2 94%   BMI 27.50 kg/m?  ? ?Physical Exam: ?General: No acute respiratory distress ?Lungs: Clear to auscultation bilaterally without wheezes or crackles ?Cardiovascular: Regular rate and rhythm without murmur  ?Abdomen: Nontender, nondistended, soft, bowel sounds positive, no rebound ?Extremities: No  significant cyanosis, clubbing, or edema bilateral lower extremities ? ?Basic Metabolic Panel: ?Recent Labs  ?Lab 07/02/21 ?0111 07/04/21 ?0654 07/05/21 ?0306 07/06/21 ?0300 07/07/21 ?0427  ?NA 140 140 141 140 140

## 2021-07-07 NOTE — TOC Transition Note (Signed)
Transition of Care (TOC) - CM/SW Discharge Note ? ? ?Patient Details  ?Name: Jenny Kelly ?MRN: [id number] ?Date of Birth: [birth year] ? ?Transition of Care (TOC) CM/SW Contact:  ?Tom-Johnson, Hershal Coria, RN ?Phone Number: ?07/07/2021, 11:48 AM ? ? ?Clinical Narrative:    ? ?Patient is scheduled for discharge today. Home health information on AVS. Denies any other needs. Husband to transport at discharge. No further TOC needs noted. ? ?Final next level of care: Home w Home Health Services ?Barriers to Discharge: Barriers Resolved ? ? ?Patient Goals and CMS Choice ?Patient states their goals for this hospitalization and ongoing recovery are:: To return home with husband ?CMS Medicare.gov Compare Post Acute Care list provided to:: Patient ?Choice offered to / list presented to : Patient, Spouse ? ?Discharge Placement ?  ?           ?  ?Patient to be transferred to facility by: Husband ?  ?  ? ?Discharge Plan and Services ?  ?Discharge Planning Services: CM Consult ?Post Acute Care Choice: Home Health          ?DME Arranged: N/A ?DME Agency: NA ?  ?  ?  ?HH Arranged: PT, OT ?HH Agency: [facility] ?Date HH Agency Contacted: 07/03/21 ?Time HH Agency Contacted: 1605 ?Representative spoke with at [facility] Agency: Kandee Keen ? ?Social Determinants of Health (SDOH) Interventions ?  ? ? ?Readmission Risk Interventions ?No flowsheet data found. ? ? ? ? ?

## 2021-07-10 ENCOUNTER — Other Ambulatory Visit: Payer: Self-pay | Admitting: Physical Medicine and Rehabilitation

## 2021-07-11 ENCOUNTER — Telehealth: Payer: Self-pay

## 2021-07-11 NOTE — Telephone Encounter (Signed)
?  Jenny Kelly Occupational Therapist called seeking a verbal order for OT (once a week for 8 weeks). Patient does not have another visit scheduled here at [facility].  Recent office not reviewed. Per office note on 06/27/2021 : Return if symptoms worsen or fail to improve. He will call [name] for continued orders. If there is an issue getting the order he will call back. [phone]).  ?    ? ?

## 2021-07-16 DIAGNOSIS — I69354 Hemiplegia and hemiparesis following cerebral infarction affecting left non-dominant side: Secondary | ICD-10-CM | POA: Diagnosis not present

## 2021-07-16 DIAGNOSIS — I509 Heart failure, unspecified: Secondary | ICD-10-CM | POA: Diagnosis not present

## 2021-07-16 DIAGNOSIS — J8410 Pulmonary fibrosis, unspecified: Secondary | ICD-10-CM | POA: Diagnosis not present

## 2021-07-16 DIAGNOSIS — I69391 Dysphagia following cerebral infarction: Secondary | ICD-10-CM | POA: Diagnosis not present

## 2021-07-22 ENCOUNTER — Other Ambulatory Visit: Payer: Self-pay | Admitting: Physical Medicine and Rehabilitation

## 2021-07-27 DIAGNOSIS — N201 Calculus of ureter: Secondary | ICD-10-CM | POA: Diagnosis not present

## 2021-07-27 DIAGNOSIS — R102 Pelvic and perineal pain: Secondary | ICD-10-CM | POA: Diagnosis not present

## 2021-08-02 ENCOUNTER — Other Ambulatory Visit: Payer: Self-pay | Admitting: Urology

## 2021-08-02 ENCOUNTER — Encounter (HOSPITAL_COMMUNITY): Payer: Self-pay | Admitting: Urology

## 2021-08-02 ENCOUNTER — Other Ambulatory Visit: Payer: Self-pay

## 2021-08-02 NOTE — Progress Notes (Addendum)
COVID Vaccine Completed:  Yes x2 ?Date COVID Vaccine completed: ?Has received booster: ?COVID vaccine manufacturer:     Moderna    ? ?Date of COVID positive in last 90 days:  No ? ?PCP - Townsend Roger, MD ?Cardiologist - Jenne Campus, MD ?Neurology - Frann Rider, NP ? ?Chest x-ray - 07-01-21 Epic ?EKG - 07-03-21 Epic ?Stress Test - chemical stress test, greater than 2 years ?ECHO - 05-02-21 Epic ?Cardiac Cath - greater than 2 years ?Pacemaker/ICD device last checked: ?Spinal Cord Stimulator: ? ?Bowel Prep - N/A ? ?Sleep Study - Yes, +sleep apnea ?CPAP - No ? ?Fasting Blood Sugar - 112 to 120 ?Checks Blood Sugar - occasionally  ? ?Blood Thinner Instructions:  Xarelto 15.  Patient to stay on ?Aspirin Instructions:  ASA 81 Patient to stay on ?Last Dose: ? ?Activity level:   Can go up a flight of stairs with assistance and perform activities of daily living without stopping and without symptoms of chest pain with assistance from spouse.  Patient does have periodic shortness of breath with exertion due to pulmonary fibrosis. ?   ?Anesthesia review:  CAD, Afib, hx of MI and stroke (left-sided weakness),  CHF, pulmonary fibrosis, COPD, CKD,  CABG in 2015.  Seizures - no recent seizure ? ?Patient denies shortness of breath, fever, cough and chest pain at PAT appointment (completed with husband over the phone) ? ?Patient verbalized understanding of instructions that were given to them at the PAT appointment. Patient was also instructed that they will need to review over the PAT instructions again at home before surgery.  ?

## 2021-08-03 NOTE — Progress Notes (Signed)
Anesthesia Chart Review: ? ? Case: [id number] Date/Time: 08/09/21 0900  ? Procedure: CYSTOSCOPY/URETEROSCOPY/HOLMIUM LASER/STENT EXCHANGE (Left)  ? Anesthesia type: General  ? Pre-op diagnosis: LEFT URETERAL STONE  ? Location: [facility] PROCEDURE ROOM / [facility]  ? Surgeons: Ceasar Mons, MD  ? ?  ? ? ?DISCUSSION: ?Pt is 76 years old with hx CAD (s/p prior stent/PCI; s/p CABG 2015), PAF, CVA (R MCA on 05/01/21 while taking xarelto), pulmonary fibrosis, chronic hypoxic respiratory failure, COPD, PE (2002), CKD ? ?Hospitalized 3/11-17/23 for sepsis due to complicated UTI, acute delirium on chronic dementia, prolonged QTc ? ?Hospitalized 1/9-15/23 for acute ischemic CVA and septic shock from pyelonephritis. Complicated by wide complex tachycardia, acute respiratory failure, AKI. Discharged to inpatient rehab.  ? ?Xarelto and ASA to be continued perioperatively ? ? ?VS: Ht 5\' 2"  (1.575 m)   Wt 64 kg   BMI 25.79 kg/m?  ? ?PROVIDERS: ?- PCP is Townsend Roger, MD ?- Cardiologist is Jenne Campus, MD. Last office visit 06/12/21.  He is aware of upcoming procedure and prefers pt remain on xarelto and ASA ?- Neurology care by Frann Rider, NP. Last office visit 06/07/21 ?- Pulmonologist is Freda Jackson, MD. Last office visit 12/13/20 ? ? ?LABS: Will be obtained day of surgery  ? ? ?IMAGES: ?CT renal stone study 07/01/21:  ?1. 9 mm nonobstructive calculus in the lower pole collecting system of the left kidney. Left-sided double-J ureteral stent remains appropriately located. There is some very mild fullness in the left renal pelvis, without frank left hydroureteronephrosis to suggest substantial stent malfunction. ?2. The appearance of the lung bases suggests substantial chronic post infectious or inflammatory scarring, although active multilobar bronchopneumonia or aspiration pneumonitis is not excluded. Further clinical evaluation is recommended. ?3. Aortic atherosclerosis, in addition to left main and three-vessel  coronary artery disease. 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 ? ?1 view CXR 07/01/21:  ?- Chronic interstitial pattern to the lungs without change. No evidence of active pulmonary disease ? ? ?EKG 07/02/21: Sinus rhythm with sinus arrhythmia with occasional PACs and PVCs. Low voltage QRS ? ? ?CV: ?Echo 05/02/21:  ?1. Left ventricular ejection fraction, by estimation, is 60 to 65%. The left ventricle has normal function. The left ventricle has no regional wall motion abnormalities. Left ventricular diastolic parameters are consistent with Grade II diastolic dysfunction (pseudonormalization). Elevated left atrial pressure.  ?2. Right ventricular systolic function is normal. The right ventricular size is normal. There is normal pulmonary artery systolic pressure.  ?3. The mitral valve is normal in structure. No evidence of mitral valve regurgitation. No evidence of mitral stenosis.  ?4. The aortic valve is normal in structure. Aortic valve regurgitation is trivial. No aortic stenosis is present.  ?5. The inferior vena cava is normal in size with <50% respiratory variability, suggesting right atrial pressure of 8 mmHg. ? ? ?Past Medical History:  ?Diagnosis Date  ? Anemia   ? Anxiety   ? Arthritis   ? Asthma   ? Atrial fibrillation ([place])   ? Blood transfusion without reported diagnosis   ? Cataract   ? CHF (congestive heart failure) ([place])   ? Chronic kidney disease   ? Clotting disorder ([place])   ? COPD (chronic obstructive pulmonary disease) ([place])   ? Dementia ([place])   ? Depression   ? Diabetes mellitus without complication ([place])   ? Dyspnea   ? GERD (gastroesophageal reflux  disease)   ? Headache   ? Heart murmur   ? History of kidney stones   ? Hyperlipidemia   ? Hypothyroidism   ? Myocardial infarction [facility])   ? Neuromuscular disorder ([place])   ? tremors  ? Pneumonia due to COVID-19 virus   ? Seizures ([place])   ? Sleep apnea   ?  Stroke [facility])   ? Thyroid disease   ? ? ?Past Surgical History:  ?Procedure Laterality Date  ? ABDOMINAL HYSTERECTOMY    ? APPENDECTOMY    ? bladder tack    ? CHOLECYSTECTOMY    ? CORONARY ARTERY BYPASS GRAFT    ? CYSTOSCOPY W/ URETERAL STENT PLACEMENT Left 05/01/2021  ? Procedure: CYSTOSCOPY WITH RETROGRADE PYELOGRAM/URETERAL STENT PLACEMENT;  Surgeon: Ceasar Mons, MD;  Location: [place];  Service: Urology;  Laterality: Left;  ? ESOPHAGOGASTRODUODENOSCOPY ENDOSCOPY    ? EYE SURGERY    ? FRACTURE SURGERY Right   ? 3rd finger  ? JOINT REPLACEMENT Bilateral   ? knees  ? TONSILLECTOMY AND ADENOIDECTOMY    ? TUBAL LIGATION    ? ? ?MEDICATIONS: ?No current facility-administered medications for this encounter.  ? ? acetaminophen (TYLENOL) 325 MG tablet  ? amoxicillin-clavulanate (AUGMENTIN) 500-125 MG tablet  ? aspirin 81 MG EC tablet  ? budesonide (PULMICORT) 0.5 MG/2ML nebulizer solution  ? cetirizine (ZYRTEC) 10 MG tablet  ? donepezil (ARICEPT) 10 MG tablet  ? HYDROcodone-acetaminophen (NORCO/VICODIN) 5-325 MG tablet  ? levothyroxine (SYNTHROID) 88 MCG tablet  ? montelukast (SINGULAIR) 10 MG tablet  ? Multiple Vitamin (MULTIVITAMIN WITH MINERALS) TABS tablet  ? omeprazole (PRILOSEC) 40 MG capsule  ? oxybutynin (DITROPAN) 5 MG tablet  ? pravastatin (PRAVACHOL) 40 MG tablet  ? pregabalin (LYRICA) 50 MG capsule  ? Probiotic Product (PROBIOTIC PO)  ? Rivaroxaban (XARELTO) 15 MG TABS tablet  ? topiramate (TOPAMAX) 200 MG tablet  ? cephALEXin (KEFLEX) 500 MG capsule  ? PERCOCET 5-325 MG tablet  ? ? ?If labs acceptable day of surgery, I anticipate pt can proceed with surgery as scheduled. .  ? ?Willeen Cass, PhD, FNP-BC ?[facility]/Anesthesiology ?Phone: [phone] ?08/03/2021 3:00 PM ? ? ? ? ? ? ?

## 2021-08-03 NOTE — Anesthesia Preprocedure Evaluation (Addendum)
Anesthesia Evaluation  ?Patient identified by MRN, date of birth, ID band ?Patient awake ? ? ? ?Reviewed: ?Allergy & Precautions, NPO status , Patient's Chart, lab work & pertinent test results ? ?Airway ?Mallampati: III ? ?TM Distance: >3 FB ?Neck ROM: Full ? ? ? Dental ? ?(+) Edentulous Upper, Poor Dentition ?Bottom teeth broken and decayed, patient states none loose:   ?Pulmonary ?asthma , sleep apnea , COPD, former smoker,  ?Pulmonary fibrosis ?  ?Pulmonary exam normal ?breath sounds clear to auscultation ? ? ? ? ? ? Cardiovascular ?+ CAD, + Past MI and +CHF  ?Normal cardiovascular exam+ dysrhythmias Atrial Fibrillation  ?Rhythm:Regular Rate:Normal ? ?EKG 07/02/21: Sinus rhythm with sinus arrhythmia with occasional PACs and PVCs. Low voltage QRS ?? ?? ?CV: ?Echo 05/02/21:  ?1. Left ventricular ejection fraction, by estimation, is 60 to 65%. The left ventricle has normal function. The left ventricle has no regional wall motion abnormalities. Left ventricular diastolic parameters are consistent with Grade II diastolic dysfunction (pseudonormalization). Elevated left atrial pressure.  ?2. Right ventricular systolic function is normal. The right ventricular size is normal. There is normal pulmonary artery systolic pressure.  ?3. The mitral valve is normal in structure. No evidence of mitral valve regurgitation. No evidence of mitral stenosis.  ?4. The aortic valve is normal in structure. Aortic valve regurgitation is trivial. No aortic stenosis is present.  ?5. The inferior vena cava is normal in size with <50% respiratory variability, suggesting right atrial pressure of 8 mmHg. ?  ?Neuro/Psych ? Headaches, Seizures -,  PSYCHIATRIC DISORDERS Anxiety Depression Dementia  Neuromuscular disease CVA   ? GI/Hepatic ?GERD  ,  ?Endo/Other  ?diabetesHypothyroidism  ? Renal/GU ?Renal disease  ? ?  ?Musculoskeletal ? ?(+) Arthritis , Osteoarthritis,   ? Abdominal ?  ?Peds ?negative pediatric  ROS ?(+)  Hematology ? ?(+) Blood dyscrasia, anemia ,   ?Anesthesia Other Findings ? ? Reproductive/Obstetrics ? ?  ? ? ? ? ? ? ? ? ? ? ? ? ? ?  ?  ? ? ? ? ?Anesthesia Physical ?Anesthesia Plan ? ?ASA: 4 ? ?Anesthesia Plan: General  ? ?Post-op Pain Management:   ? ?Induction: Intravenous ? ?PONV Risk Score and Plan: 3 ? ?Airway Management Planned: LMA ? ?Additional Equipment: None ? ?Intra-op Plan:  ? ?Post-operative Plan: Extubation in OR ? ?Informed Consent: I have reviewed the patients History and Physical, chart, labs and discussed the procedure including the risks, benefits and alternatives for the proposed anesthesia with the patient or authorized representative who has indicated his/her understanding and acceptance.  ? ? ? ?Dental advisory given ? ?Plan Discussed with: CRNA, Anesthesiologist and Surgeon ? ?Anesthesia Plan Comments: ( Last dose of xarelto and ASA 08/08/21)  ? ? ? ?Anesthesia Quick Evaluation ? ?

## 2021-08-06 DIAGNOSIS — N390 Urinary tract infection, site not specified: Secondary | ICD-10-CM | POA: Diagnosis not present

## 2021-08-06 DIAGNOSIS — N200 Calculus of kidney: Secondary | ICD-10-CM | POA: Diagnosis not present

## 2021-08-08 ENCOUNTER — Encounter (HOSPITAL_COMMUNITY): Payer: Self-pay | Admitting: Urology

## 2021-08-09 ENCOUNTER — Encounter (HOSPITAL_COMMUNITY): Payer: Self-pay | Admitting: Urology

## 2021-08-09 ENCOUNTER — Ambulatory Visit (HOSPITAL_COMMUNITY): Payer: Medicare HMO

## 2021-08-09 ENCOUNTER — Encounter (HOSPITAL_COMMUNITY)
Admission: RE | Disposition: A | Discharge status: Home / Self Care | Payer: Self-pay | Source: Home / Self Care | Attending: Urology

## 2021-08-09 ENCOUNTER — Ambulatory Visit (HOSPITAL_COMMUNITY): Payer: Medicare HMO | Admitting: Emergency Medicine

## 2021-08-09 ENCOUNTER — Ambulatory Visit (HOSPITAL_COMMUNITY)
Admission: RE | Admit: 2021-08-09 | Discharge: 2021-08-09 | Disposition: A | Discharge status: Home / Self Care | Payer: Medicare HMO | Attending: Urology | Admitting: Urology

## 2021-08-09 ENCOUNTER — Ambulatory Visit (HOSPITAL_BASED_OUTPATIENT_CLINIC_OR_DEPARTMENT_OTHER): Payer: Medicare HMO | Admitting: Emergency Medicine

## 2021-08-09 DIAGNOSIS — J439 Emphysema, unspecified: Secondary | ICD-10-CM | POA: Insufficient documentation

## 2021-08-09 DIAGNOSIS — F0390 Unspecified dementia without behavioral disturbance: Secondary | ICD-10-CM | POA: Diagnosis not present

## 2021-08-09 DIAGNOSIS — N202 Calculus of kidney with calculus of ureter: Secondary | ICD-10-CM | POA: Insufficient documentation

## 2021-08-09 DIAGNOSIS — I2510 Atherosclerotic heart disease of native coronary artery without angina pectoris: Secondary | ICD-10-CM

## 2021-08-09 DIAGNOSIS — Z87891 Personal history of nicotine dependence: Secondary | ICD-10-CM

## 2021-08-09 DIAGNOSIS — I4891 Unspecified atrial fibrillation: Secondary | ICD-10-CM | POA: Insufficient documentation

## 2021-08-09 DIAGNOSIS — R569 Unspecified convulsions: Secondary | ICD-10-CM | POA: Diagnosis not present

## 2021-08-09 DIAGNOSIS — N200 Calculus of kidney: Secondary | ICD-10-CM | POA: Diagnosis not present

## 2021-08-09 DIAGNOSIS — I509 Heart failure, unspecified: Secondary | ICD-10-CM | POA: Diagnosis not present

## 2021-08-09 DIAGNOSIS — E039 Hypothyroidism, unspecified: Secondary | ICD-10-CM | POA: Diagnosis not present

## 2021-08-09 DIAGNOSIS — Z8673 Personal history of transient ischemic attack (TIA), and cerebral infarction without residual deficits: Secondary | ICD-10-CM | POA: Insufficient documentation

## 2021-08-09 DIAGNOSIS — K219 Gastro-esophageal reflux disease without esophagitis: Secondary | ICD-10-CM | POA: Insufficient documentation

## 2021-08-09 DIAGNOSIS — I110 Hypertensive heart disease with heart failure: Secondary | ICD-10-CM | POA: Diagnosis not present

## 2021-08-09 DIAGNOSIS — D649 Anemia, unspecified: Secondary | ICD-10-CM

## 2021-08-09 DIAGNOSIS — G4730 Sleep apnea, unspecified: Secondary | ICD-10-CM | POA: Diagnosis not present

## 2021-08-09 DIAGNOSIS — I252 Old myocardial infarction: Secondary | ICD-10-CM | POA: Insufficient documentation

## 2021-08-09 DIAGNOSIS — D638 Anemia in other chronic diseases classified elsewhere: Secondary | ICD-10-CM | POA: Diagnosis not present

## 2021-08-09 DIAGNOSIS — N201 Calculus of ureter: Secondary | ICD-10-CM | POA: Diagnosis not present

## 2021-08-09 DIAGNOSIS — E119 Type 2 diabetes mellitus without complications: Secondary | ICD-10-CM | POA: Insufficient documentation

## 2021-08-09 HISTORY — DX: COVID-19: U07.1

## 2021-08-09 HISTORY — DX: Pneumonia due to coronavirus disease 2019: J12.82

## 2021-08-09 HISTORY — DX: Dyspnea, unspecified: R06.00

## 2021-08-09 HISTORY — DX: Personal history of urinary calculi: Z87.442

## 2021-08-09 HISTORY — DX: Hypothyroidism, unspecified: E03.9

## 2021-08-09 HISTORY — DX: Headache, unspecified: R51.9

## 2021-08-09 HISTORY — DX: Unspecified dementia, unspecified severity, without behavioral disturbance, psychotic disturbance, mood disturbance, and anxiety: F03.90

## 2021-08-09 HISTORY — PX: CYSTOSCOPY/URETEROSCOPY/HOLMIUM LASER/STENT PLACEMENT: SHX6546

## 2021-08-09 LAB — CBC
HCT: 44.4 % (ref 36.0–46.0)
Hemoglobin: 13.2 g/dL (ref 12.0–15.0)
MCH: 27.4 pg (ref 26.0–34.0)
MCHC: 29.7 g/dL — ABNORMAL LOW (ref 30.0–36.0)
MCV: 92.3 fL (ref 80.0–100.0)
Platelets: 282 10*3/uL (ref 150–400)
RBC: 4.81 MIL/uL (ref 3.87–5.11)
RDW: 16.5 % — ABNORMAL HIGH (ref 11.5–15.5)
WBC: 11.8 10*3/uL — ABNORMAL HIGH (ref 4.0–10.5)
nRBC: 0 % (ref 0.0–0.2)

## 2021-08-09 LAB — BASIC METABOLIC PANEL
Anion gap: 9 (ref 5–15)
BUN: 14 mg/dL (ref 8–23)
CO2: 21 mmol/L — ABNORMAL LOW (ref 22–32)
Calcium: 8.9 mg/dL (ref 8.9–10.3)
Chloride: 111 mmol/L (ref 98–111)
Creatinine, Ser: 1.43 mg/dL — ABNORMAL HIGH (ref 0.44–1.00)
GFR, Estimated: 38 mL/min — ABNORMAL LOW (ref >60)
Glucose, Bld: 102 mg/dL — ABNORMAL HIGH (ref 70–99)
Potassium: 3.4 mmol/L — ABNORMAL LOW (ref 3.5–5.1)
Sodium: 141 mmol/L (ref 135–145)

## 2021-08-09 LAB — HEMOGLOBIN A1C
Hgb A1c MFr Bld: 6 % — ABNORMAL HIGH (ref 4.8–5.6)
Mean Plasma Glucose: 125.5 mg/dL

## 2021-08-09 LAB — GLUCOSE, CAPILLARY: Glucose-Capillary: 119 mg/dL — ABNORMAL HIGH (ref 70–99)

## 2021-08-09 SURGERY — CYSTOSCOPY/URETEROSCOPY/HOLMIUM LASER/STENT PLACEMENT
Anesthesia: General | Site: Urethra | Laterality: Left

## 2021-08-09 MED ORDER — SODIUM CHLORIDE 0.9 % IR SOLN
Status: DC | PRN
  Administered 2021-08-09: 1000 mL
  Administered 2021-08-09: 3000 mL via INTRAVESICAL

## 2021-08-09 MED ORDER — OXYCODONE HCL 5 MG/5ML PO SOLN: 5.0000 mg | Freq: Once | ORAL | Status: DC | PRN

## 2021-08-09 MED ORDER — LIDOCAINE HCL (CARDIAC) PF 100 MG/5ML IV SOSY
PREFILLED_SYRINGE | INTRAVENOUS | Status: DC | PRN
  Administered 2021-08-09: 60 mg via INTRAVENOUS

## 2021-08-09 MED ORDER — CEPHALEXIN 500 MG PO CAPS: 500.0000 mg | ORAL_CAPSULE | Freq: Two times a day (BID) | ORAL | 0 refills | Status: AC

## 2021-08-09 MED ORDER — ONDANSETRON HCL 4 MG/2ML IJ SOLN
INTRAMUSCULAR | Status: DC | PRN
  Administered 2021-08-09: 4 mg via INTRAVENOUS

## 2021-08-09 MED ORDER — FENTANYL CITRATE PF 50 MCG/ML IJ SOSY: 25.0000 ug | PREFILLED_SYRINGE | INTRAMUSCULAR | Status: DC | PRN

## 2021-08-09 MED ORDER — FENTANYL CITRATE (PF) 100 MCG/2ML IJ SOLN
INTRAMUSCULAR | Status: DC | PRN
  Administered 2021-08-09 (×5): 12.5 ug via INTRAVENOUS
  Administered 2021-08-09: 25 ug via INTRAVENOUS
  Administered 2021-08-09: 12.5 ug via INTRAVENOUS

## 2021-08-09 MED ORDER — CHLORHEXIDINE GLUCONATE 0.12 % MT SOLN
15.0000 mL | Freq: Once | OROMUCOSAL | Status: AC
  Administered 2021-08-09: 15 mL via OROMUCOSAL

## 2021-08-09 MED ORDER — ORAL CARE MOUTH RINSE: 15.0000 mL | Freq: Once | OROMUCOSAL | Status: AC

## 2021-08-09 MED ORDER — PROPOFOL 10 MG/ML IV BOLUS
INTRAVENOUS | Status: DC | PRN
  Administered 2021-08-09: 20 mg via INTRAVENOUS
  Administered 2021-08-09: 150 mg via INTRAVENOUS

## 2021-08-09 MED ORDER — LACTATED RINGERS IV SOLN: INTRAVENOUS | Status: DC

## 2021-08-09 MED ORDER — OXYCODONE HCL 5 MG PO TABS: 5.0000 mg | ORAL_TABLET | Freq: Once | ORAL | Status: DC | PRN

## 2021-08-09 MED ORDER — LIDOCAINE HCL (PF) 2 % IJ SOLN
INTRAMUSCULAR | Status: AC
  Filled 2021-08-09: qty 5

## 2021-08-09 MED ORDER — FENTANYL CITRATE (PF) 100 MCG/2ML IJ SOLN
INTRAMUSCULAR | Status: AC
  Filled 2021-08-09: qty 2

## 2021-08-09 MED ORDER — EPHEDRINE 5 MG/ML INJ
INTRAVENOUS | Status: AC
  Filled 2021-08-09: qty 5

## 2021-08-09 MED ORDER — PHENYLEPHRINE HCL (PRESSORS) 10 MG/ML IV SOLN
INTRAVENOUS | Status: DC | PRN
  Administered 2021-08-09: 80 ug via INTRAVENOUS

## 2021-08-09 MED ORDER — CEFAZOLIN SODIUM-DEXTROSE 2-4 GM/100ML-% IV SOLN
2.0000 g | Freq: Once | INTRAVENOUS | Status: AC
  Administered 2021-08-09: 2 g via INTRAVENOUS
  Filled 2021-08-09: qty 100

## 2021-08-09 MED ORDER — PROPOFOL 10 MG/ML IV BOLUS
INTRAVENOUS | Status: AC
  Filled 2021-08-09: qty 20

## 2021-08-09 MED ORDER — AMISULPRIDE (ANTIEMETIC) 5 MG/2ML IV SOLN: 10.0000 mg | Freq: Once | INTRAVENOUS | Status: DC | PRN

## 2021-08-09 MED ORDER — EPHEDRINE SULFATE (PRESSORS) 50 MG/ML IJ SOLN
INTRAMUSCULAR | Status: DC | PRN
  Administered 2021-08-09: 10 mg via INTRAVENOUS

## 2021-08-09 MED ORDER — PHENYLEPHRINE 80 MCG/ML (10ML) SYRINGE FOR IV PUSH (FOR BLOOD PRESSURE SUPPORT)
PREFILLED_SYRINGE | INTRAVENOUS | Status: AC
  Filled 2021-08-09: qty 10

## 2021-08-09 MED ORDER — ONDANSETRON HCL 4 MG/2ML IJ SOLN
INTRAMUSCULAR | Status: AC
  Filled 2021-08-09: qty 2

## 2021-08-09 SURGICAL SUPPLY — 25 items
BAG URO CATCHER STRL LF (MISCELLANEOUS) ×2 IMPLANT
BASKET ZERO TIP NITINOL 2.4FR (BASKET) IMPLANT
BSKT STON RTRVL ZERO TP 2.4FR (BASKET)
CATH URETL OPEN 5X70 (CATHETERS) ×2 IMPLANT
CLOTH BEACON ORANGE TIMEOUT ST (SAFETY) ×2 IMPLANT
EXTRACTOR STONE NITINOL NGAGE (UROLOGICAL SUPPLIES) ×2 IMPLANT
FIBER LASER MOSES 200 DFL (Laser) ×1 IMPLANT
GLOVE BIOGEL PI IND STRL 7.0 (GLOVE) IMPLANT
GLOVE BIOGEL PI INDICATOR 7.0 (GLOVE) ×1
GLOVE SURG LX 7.5 STRW (GLOVE) ×1
GLOVE SURG LX STRL 7.5 STRW (GLOVE) ×1 IMPLANT
GOWN STRL REUS W/ TWL XL LVL3 (GOWN DISPOSABLE) ×1 IMPLANT
GOWN STRL REUS W/TWL XL LVL3 (GOWN DISPOSABLE) ×4
GUIDEWIRE STR DUAL SENSOR (WIRE) ×1 IMPLANT
GUIDEWIRE ZIPWRE .038 STRAIGHT (WIRE) ×1 IMPLANT
KIT TURNOVER KIT A (KITS) ×2 IMPLANT
LASER FIB FLEXIVA PULSE ID 365 (Laser) IMPLANT
MANIFOLD NEPTUNE II (INSTRUMENTS) ×2 IMPLANT
PACK CYSTO (CUSTOM PROCEDURE TRAY) ×2 IMPLANT
SHEATH NAVIGATOR HD 11/13X36 (SHEATH) ×1 IMPLANT
STENT URET 6FRX26 CONTOUR (STENTS) IMPLANT
TRACTIP FLEXIVA PULS ID 200XHI (Laser) IMPLANT
TRACTIP FLEXIVA PULSE ID 200 (Laser)
TUBING CONNECTING 10 (TUBING) ×2 IMPLANT
TUBING UROLOGY SET (TUBING) ×2 IMPLANT

## 2021-08-09 NOTE — Anesthesia Postprocedure Evaluation (Signed)
Anesthesia Post Note ? ?Patient: Jenny Kelly ? ?Procedure(s) Performed: CYSTOSCOPY/URETEROSCOPY/HOLMIUM LASER/STENT EXCHANGE (Left: Urethra) ? ?  ? ?Patient location during evaluation: PACU ?Anesthesia Type: General ?Level of consciousness: awake ?Pain management: pain level controlled ?Vital Signs Assessment: post-procedure vital signs reviewed and stable ?Respiratory status: spontaneous breathing and respiratory function stable ?Cardiovascular status: stable ?Postop Assessment: no apparent nausea or vomiting ?Anesthetic complications: no ? ? ?No notable events documented. ? ?Last Vitals:  ?Vitals:  ? 08/09/21 1115 08/09/21 1130  ?BP: (!) 159/66 (!) 160/68  ?Pulse: 64 (!) 52  ?Resp: 13 16  ?Temp:    ?SpO2: 96% 92%  ?  ?Last Pain:  ?Vitals:  ? 08/09/21 1130  ?TempSrc:   ?PainSc: 0-No pain  ? ? ?  ?  ?  ?  ?  ?  ? ?Mellody Dance ? ? ? ? ?

## 2021-08-09 NOTE — Anesthesia Procedure Notes (Addendum)
Procedure Name: LMA Insertion ?Date/Time: 08/09/2021 9:37 AM ?Performed by: Jessica Priest, CRNA ?Pre-anesthesia Checklist: Patient identified, Emergency Drugs available, Suction available, Patient being monitored and Timeout performed ?Patient Re-evaluated:Patient Re-evaluated prior to induction ?Oxygen Delivery Method: Circle system utilized ?Preoxygenation: Pre-oxygenation with 100% oxygen ?Induction Type: IV induction ?Ventilation: Mask ventilation without difficulty ?LMA: LMA inserted ?LMA Size: 3.0 ?Number of attempts: 1 ?Airway Equipment and Method: Bite block ?Placement Confirmation: positive ETCO2, breath sounds checked- equal and bilateral and CO2 detector ?Tube secured with: Tape ?Dental Injury: Teeth and Oropharynx as per pre-operative assessment  ?Comments: All teeth poor decayed chipped missing poor gum line w/ few teeth ? ? ? ? ?

## 2021-08-09 NOTE — H&P (Signed)
PRE-OP H&P ? ?Office Visit Report     07/27/2021  ? ?-------------------------------------------------------------------------------- ?  ?Jenny Kelly  ?MRN: [id number]  ?DOB: 05-09-1945, 76 year old Female  ?SSN:   ? PRIMARY CARE:  Townsend Roger, MD  ?REFERRING:  Glena Norfolk. Lovena Neighbours, MD  ?PROVIDER:  Ellison Hughs, M.D.  ?LOCATION:  [facility] [phone]  ?  ? ?-------------------------------------------------------------------------------- ?  ?CC/HPI: Kidney stone  ? ?[name] is a 76 year old female with a history of urosepsis secondary to E. coli bacteremia, kidney stones, status post left ureteral stent due to an obstructing 7 mm proximal stone on 05/01/2021. She has a past medical history significant for heart failure, emphysema, DM 2, A-fib and right MCA stroke.  ? ?06/01/2021: The patient is here today to discuss definitive treatment of her left-sided ureteral stone. Recovering well from her recent CVA. Her biggest complaint today is dysuria that lasts 20 minutes after each void. She denies flank pain, fever/chills or nausea/vomiting.  ? ?Neurologist: Rosalin Hawking, MD  ? ?07/27/2021: The patient presents back today after being hospitalized due to urosepsis from E. coli UTI. CT from 07/01/2021 showed that her left ureteral stent was in good position. She continues to have significant vulvar pain and dysuria, which is significantly impacting her quality of life. She states that she does not care about the cerebrovascular or cardiovascular risks with surgery that she just wants to proceed. She denies interval fever/chills or flank pain since her hospitalization. Dr. Agustin Cree with cardiology recommended that she wait approximately until late April before proceeding with surgery to address her stone burden.  ? ?  ?ALLERGIES: Ciprofloxacin ?Morphine Hcl ?Prednisone ?Prochlorperazine Edisylate ?statins ?  ? ?MEDICATIONS: Omeprazole  ?Oxybutynin Chloride 5 mg tablet 1 tablet PO Q 8 H PRN Take as  needed for bladder spasms  ?Aspirin Ec 81 mg tablet, delayed release  ?Iron  ?Mucinex  ?Multivitamin  ?Pregabalin  ?Vitamin B12  ?Vitamin D2  ?Xarelto  ?  ? ?GU PSH: No GU PSH   ?   ?PSH Notes: cardiac bypass, hysterectomy, laparoscopic cholecystectomy  ? ?NON-GU PSH: Knee replacement, Bilateral ? ?  ? ?GU PMH: Ureteral calculus - 06/01/2021 ?  ?   ?PMH Notes: bleeding disorder, blood clot, depression, diabetes, heart attack, heart disease, kidney failure, seizures, pulmonary fibrosis   ? ?NON-GU PMH: Anxiety ?Arrhythmia ?Arthritis ?Asthma ?Atrial Fibrillation ?GERD ?Hypercholesterolemia ?Hypertension ?Sleep Apnea ?Stroke/TIA ?  ? ?FAMILY HISTORY: 3 Son's - Son  ? ?SOCIAL HISTORY: Marital Status: Married ?Preferred Language: [place]; Ethnicity: Not Hispanic Or Latino; Race: White ?Current Smoking Status: Patient does not smoke anymore.  ? ?Tobacco Use Assessment Completed: Used Tobacco in last 30 days? ?Has never drank.  ?Drinks 1 caffeinated drink per day. ?  ? ?REVIEW OF SYSTEMS:    ?GU Review Female:   Patient denies frequent urination, hard to postpone urination, burning /pain with urination, get up at night to urinate, leakage of urine, stream starts and stops, trouble starting your stream, have to strain to urinate, and being pregnant.  ?Gastrointestinal (Upper):   Patient denies nausea, vomiting, and indigestion/ heartburn.  ?Gastrointestinal (Lower):   Patient denies diarrhea and constipation.  ?Constitutional:   Patient denies fever, night sweats, weight loss, and fatigue.  ?Skin:   Patient denies skin rash/ lesion and itching.  ?Eyes:   Patient denies blurred vision and double vision.  ?Ears/ Nose/ Throat:   Patient denies sore throat and sinus problems.  ?Hematologic/Lymphatic:   Patient denies swollen glands and easy bruising.  ?  Cardiovascular:   Patient denies chest pains and leg swelling.  ?Respiratory:   Patient denies cough and shortness of breath.  ?Endocrine:   Patient denies excessive thirst.   ?Musculoskeletal:   Patient denies back pain and joint pain.  ?Neurological:   Patient denies headaches and dizziness.  ?Psychologic:   Patient denies depression and anxiety.  ? ?VITAL SIGNS:    ?  07/27/2021 10:46 AM  ?Weight 141 lb / 63.96 kg  ?Height 62 in / 157.48 cm  ?BP 122/77 mmHg  ?Pulse 86 /min  ?Temperature 97.3 F / 36.2 C  ?BMI 25.8 kg/m?  ? ?MULTI-SYSTEM PHYSICAL EXAMINATION:    ?Constitutional: Well-nourished. No physical deformities. Normally developed. Good grooming.  ?Respiratory: No labored breathing, no use of accessory muscles.   ?Neurologic / Psychiatric: Oriented to time, oriented to place, oriented to person. No depression, no anxiety, no agitation.  ? ?  ?Complexity of Data:  ?Records Review:   Previous Hospital Records, Previous Patient Records  ?X-Ray Review: [facility] Protocol: Reviewed Films. Reviewed Report. Discussed With Patient.  ?  ?Notes:                     CLINICAL DATA: 76 year old female with history of urinary tract  ?infection. Possible nephrolithiasis.  ?   ?EXAM:  ?CT ABDOMEN AND PELVIS WITHOUT CONTRAST  ?   ?TECHNIQUE:  ?Multidetector CT imaging of the abdomen and pelvis was performed  ?following the standard protocol without IV contrast.  ?   ?RADIATION DOSE REDUCTION: This exam was performed according to the  ?departmental dose-optimization program which includes automated  ?exposure control, adjustment of the mA and/or kV according to  ?patient size and/or use of iterative reconstruction technique.  ?   ?COMPARISON: CT of the abdomen and pelvis 06/01/2021.  ?   ?FINDINGS:  ?Lower chest: Extensive areas of cylindrical bronchiectasis,  ?thickening of the peribronchovascular interstitium and regional  ?architectural distortion and volume loss are again noted in the lung  ?bases, most severe in the lower lobes, likely progressive post  ?infectious or inflammatory scarring, although the possibility of  ?active infection or sequela of recent aspiration is not excluded.   ?Atherosclerotic calcifications in the thoracic aorta as well as the  ?left main, left anterior descending, left circumflex and right  ?coronary arteries.  ?   ?Hepatobiliary: No definite suspicious cystic or solid hepatic  ?lesions are confidently identified on today's noncontrast CT  ?examination. A few scattered tiny calcified granulomas are noted  ?throughout the liver. Status post cholecystectomy.  ?   ?Pancreas: Numerous coarse calcifications are noted throughout the  ?pancreas, along with diffuse pancreatic atrophy, which is likely  ?sequela of chronic pancreatitis. No discrete pancreatic mass  ?confidently identified on today's noncontrast CT examination. No  ?peripancreatic fluid collections or inflammatory changes.  ?   ?Spleen: Numerous calcified granulomas throughout the spleen. Small  ?splenule medial to the spleen.  ?   ?Adrenals/Urinary Tract: In the lower pole collecting system of the  ?left kidney there is a 9 mm nonobstructive calculus. Left-sided  ?double-J ureteral stent appears appropriately located with the  ?proximal loop reformed in the left renal pelvis and distal loop  ?reformed in the urinary bladder. No additional calculi are  ?confidently identified within the right renal collecting system,  ?along the course of either ureter, or within the lumen of the  ?urinary bladder. There is mild fullness in the left renal pelvis,  ?but no frank hydroureteronephrosis. Unenhanced appearance of the  ?  right kidney, bilateral adrenal glands and urinary bladder is  ?unremarkable.  ?   ?Stomach/Bowel: Unenhanced appearance of the stomach is normal. There  ?is no pathologic dilatation of small bowel or colon. The appendix is  ?not confidently identified and may be surgically absent. Regardless,  ?there are no inflammatory changes noted adjacent to the cecum to  ?suggest the presence of an acute appendicitis at this time.  ?   ?Vascular/Lymphatic: Aortic atherosclerosis. 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 No lymphadenopathy noted in the abdomen or pelvis.  ?   ?Reproductive: Status post hysterectomy. Ovaries are not confidently  ?identified may b

## 2021-08-09 NOTE — Transfer of Care (Signed)
Immediate Anesthesia Transfer of Care Note ? ?Patient: Jenny Kelly ? ?Procedure(s) Performed: Procedure(s) (LRB): ?CYSTOSCOPY/URETEROSCOPY/HOLMIUM LASER/STENT EXCHANGE (Left) ? ?Patient Location: PACU ? ?Anesthesia Type: General ? ?Level of Consciousness: awake, sedated, patient cooperative and responds to stimulation ? ?Airway & Oxygen Therapy: Patient Spontanous Breathing and Patient connected to face mask oxygen ? ?Post-op Assessment: Report given to PACU RN, Post -op Vital signs reviewed and stable and Patient moving all extremities ? ?Post vital signs: Reviewed and stable ? ?Complications: No apparent anesthesia complications ?

## 2021-08-09 NOTE — Op Note (Addendum)
Operative Note ? ?Preoperative diagnosis:  ?1.  7 mm proximal left ureteral stone along with multiple left renal stones ?Postoperative diagnosis: ?1.  Multiple left renal stones ? ?Procedure(s): ?1.  Cystoscopy with left ureteroscopy, holmium laser lithotripsy, basket stone extraction left ureteral stent exchange ? ?Surgeon: Rhoderick Moody, MD ? ?Assistants:  None ? ?Anesthesia:  General ? ?Complications:  None ? ?EBL: 20 ? ? ?Specimens: ?1.  Left renal stones ?2.  Previously placed right ureteral stent was removed intact, inspected and discarded ? ?Drains/Catheters: ?1.  Left 6 French, 24 cm JJ stent with tether ? ?Intraoperative findings:   ?Multiple left renal stones ? ?Indication:  Jenny Kelly is a 76 y.o. female with a history of an obstructing 7 mm proximal left ureteral stone that required urgent stent placement on 05/01/21.  She was also diagnosed with a CVA at that time.  She is here today for definitive stone treatment.  He has been consented, voices understanding to proceed. ? ?Description of procedure: ? ?After informed consent was obtained, the patient was brought to the operating room and general LMA anesthesia was administered. The patient was then placed in the dorsolithotomy position and prepped and draped in the usual sterile fashion. A timeout was performed. A 23 French rigid cystoscope was then inserted into the urethral meatus and advanced into the bladder under direct vision. A complete bladder survey revealed no intravesical pathology. ? ?Her previously placed left ureteral stent was then grasped at its distal curl and removed intact.  A Glidewire was then advanced up the left ureter to the left renal pelvis, fluoroscopic guidance.  An additional sensor wire was then up the left ureter and a similar fashion.  An 11/13 French ureteral access sheath was then advanced over the sensor wire and into good position within the proximal ureter. ? ?The wall of hybrid digital ureteroscope was then  advanced through the access sheath and 5.  A 200 ?m holmium laser was then used to fracture pieces.   Engage basket was used to extract all stone fragments from the renal pelvis and all associated calyces. ? ?Ureteroscope and ureteral access sheath were then direct vision, identifying no residual stone burden or evidence of ureteral trauma.  A new 6 [place], 24 cm JJ stent with a tether was then advanced over the Glidewire into position within the left collecting system, confirming placement via fluoroscopy.  The patient's bladder was drained.  He tolerated the procedure well and was transferred to the postanesthesia in stable condition. ? ?Plan: Discharge home.  The patient been instructed to remove her ureteral stent at 7 AM on 08/11/2021.  Keep scheduled follow-up appointment on 08/17/21 for a checkup. ? ?

## 2021-08-10 ENCOUNTER — Encounter (HOSPITAL_COMMUNITY): Payer: Self-pay | Admitting: Urology

## 2021-08-16 DIAGNOSIS — R8279 Other abnormal findings on microbiological examination of urine: Secondary | ICD-10-CM | POA: Diagnosis not present

## 2021-08-16 DIAGNOSIS — M7522 Bicipital tendinitis, left shoulder: Secondary | ICD-10-CM | POA: Diagnosis not present

## 2021-08-16 DIAGNOSIS — N200 Calculus of kidney: Secondary | ICD-10-CM | POA: Diagnosis not present

## 2021-08-17 DIAGNOSIS — R8271 Bacteriuria: Secondary | ICD-10-CM | POA: Diagnosis not present

## 2021-08-17 DIAGNOSIS — G8194 Hemiplegia, unspecified affecting left nondominant side: Secondary | ICD-10-CM | POA: Diagnosis not present

## 2021-08-17 DIAGNOSIS — M19012 Primary osteoarthritis, left shoulder: Secondary | ICD-10-CM | POA: Diagnosis not present

## 2021-08-17 DIAGNOSIS — N201 Calculus of ureter: Secondary | ICD-10-CM | POA: Diagnosis not present

## 2021-08-25 ENCOUNTER — Other Ambulatory Visit: Payer: Self-pay | Admitting: Physical Medicine and Rehabilitation

## 2021-09-08 DIAGNOSIS — D649 Anemia, unspecified: Secondary | ICD-10-CM | POA: Diagnosis not present

## 2021-09-08 DIAGNOSIS — Z6824 Body mass index (BMI) 24.0-24.9, adult: Secondary | ICD-10-CM | POA: Diagnosis not present

## 2021-09-08 DIAGNOSIS — I25810 Atherosclerosis of coronary artery bypass graft(s) without angina pectoris: Secondary | ICD-10-CM | POA: Diagnosis not present

## 2021-09-08 DIAGNOSIS — Z0000 Encounter for general adult medical examination without abnormal findings: Secondary | ICD-10-CM | POA: Diagnosis not present

## 2021-09-08 DIAGNOSIS — I5032 Chronic diastolic (congestive) heart failure: Secondary | ICD-10-CM | POA: Diagnosis not present

## 2021-09-08 DIAGNOSIS — D519 Vitamin B12 deficiency anemia, unspecified: Secondary | ICD-10-CM | POA: Diagnosis not present

## 2021-09-08 DIAGNOSIS — E1159 Type 2 diabetes mellitus with other circulatory complications: Secondary | ICD-10-CM | POA: Diagnosis not present

## 2021-09-08 DIAGNOSIS — E7800 Pure hypercholesterolemia, unspecified: Secondary | ICD-10-CM | POA: Diagnosis not present

## 2021-09-08 DIAGNOSIS — Z1331 Encounter for screening for depression: Secondary | ICD-10-CM | POA: Diagnosis not present

## 2021-09-08 DIAGNOSIS — I10 Essential (primary) hypertension: Secondary | ICD-10-CM | POA: Diagnosis not present

## 2021-09-08 DIAGNOSIS — E559 Vitamin D deficiency, unspecified: Secondary | ICD-10-CM | POA: Diagnosis not present

## 2021-09-08 DIAGNOSIS — N1831 Chronic kidney disease, stage 3a: Secondary | ICD-10-CM | POA: Diagnosis not present

## 2021-09-08 DIAGNOSIS — E1121 Type 2 diabetes mellitus with diabetic nephropathy: Secondary | ICD-10-CM | POA: Diagnosis not present

## 2021-09-08 DIAGNOSIS — E039 Hypothyroidism, unspecified: Secondary | ICD-10-CM | POA: Diagnosis not present

## 2021-09-13 DIAGNOSIS — J988 Other specified respiratory disorders: Secondary | ICD-10-CM | POA: Diagnosis not present

## 2021-09-13 DIAGNOSIS — J8410 Pulmonary fibrosis, unspecified: Secondary | ICD-10-CM | POA: Diagnosis not present

## 2021-09-13 DIAGNOSIS — N390 Urinary tract infection, site not specified: Secondary | ICD-10-CM | POA: Diagnosis not present

## 2021-09-13 DIAGNOSIS — R059 Cough, unspecified: Secondary | ICD-10-CM | POA: Diagnosis not present

## 2021-09-14 DIAGNOSIS — J1282 Pneumonia due to coronavirus disease 2019: Secondary | ICD-10-CM | POA: Insufficient documentation

## 2021-09-14 DIAGNOSIS — R0600 Dyspnea, unspecified: Secondary | ICD-10-CM | POA: Insufficient documentation

## 2021-09-14 DIAGNOSIS — F03918 Unspecified dementia, unspecified severity, with other behavioral disturbance: Secondary | ICD-10-CM | POA: Insufficient documentation

## 2021-09-14 DIAGNOSIS — F0390 Unspecified dementia without behavioral disturbance: Secondary | ICD-10-CM | POA: Insufficient documentation

## 2021-09-14 DIAGNOSIS — Z87442 Personal history of urinary calculi: Secondary | ICD-10-CM | POA: Insufficient documentation

## 2021-09-15 ENCOUNTER — Ambulatory Visit (INDEPENDENT_AMBULATORY_CARE_PROVIDER_SITE_OTHER): Payer: Medicare HMO | Admitting: Cardiology

## 2021-09-15 ENCOUNTER — Encounter: Payer: Self-pay | Admitting: Cardiology

## 2021-09-15 VITALS — BP 100/70 | HR 97 | Ht 62.0 in | Wt 135.0 lb

## 2021-09-15 DIAGNOSIS — I2510 Atherosclerotic heart disease of native coronary artery without angina pectoris: Secondary | ICD-10-CM

## 2021-09-15 DIAGNOSIS — Z951 Presence of aortocoronary bypass graft: Secondary | ICD-10-CM | POA: Diagnosis not present

## 2021-09-15 DIAGNOSIS — I480 Paroxysmal atrial fibrillation: Secondary | ICD-10-CM | POA: Diagnosis not present

## 2021-09-15 DIAGNOSIS — E785 Hyperlipidemia, unspecified: Secondary | ICD-10-CM

## 2021-09-15 DIAGNOSIS — J8410 Pulmonary fibrosis, unspecified: Secondary | ICD-10-CM

## 2021-09-15 MED ORDER — PRAVASTATIN SODIUM 80 MG PO TABS: 80.0000 mg | ORAL_TABLET | Freq: Every evening | ORAL | 0 refills | Status: DC

## 2021-09-15 MED ORDER — RIVAROXABAN 15 MG PO TABS: 15.0000 mg | ORAL_TABLET | Freq: Every day | ORAL | 3 refills | Status: DC

## 2021-09-15 NOTE — Addendum Note (Signed)
Addended by: Roosvelt Harps R on: 09/15/2021 10:54 AM   Modules accepted: Orders

## 2021-09-15 NOTE — Progress Notes (Signed)
Cardiology Office Note:    Date:  09/15/2021   ID:  Jenny Kelly, DOB August 01, 1945, MRN [id number]  PCP:  Crist Fat, MD  Cardiologist:  Gypsy Balsam, MD    Referring MD: Leonia Reader, Barbara Cower, MD   Chief Complaint  Patient presents with   Follow-up  Doing better  History of Present Illness:    Jenny Kelly is a 76 y.o. female with past medical history significant for coronary artery disease, she did have coronary bypass grafting x3 in 2015, history of kidney disease hyperlipidemia PE paroxysmal atrial fibrillation diabetes seizures thyroid disorder COPD.  In January she presented to the hospital because of stroke.  She was still taking Xarelto in spite of that she end up having stroke it was addressed by adding aspirin to her medical regimen.  She was also find to have urinary tract infection and kidney stone and she did have urinary stent placed.  Plan was to perform lithotripsy with stent replacement however surgery had to be postponed because of inability to interrupt her anticoagulation secondary to fresh stroke.  Finally the end of April she had a procedure done seems to be recovering quite nicely however what happened now is the fact that she is having some infectious process going on she got elevation of white blood cell count.  She was put on antibiotic, her urinalysis was sent for cultures we do not have it back yet.  She denies have any cardiac complaints.  There is no chest pain tightness squeezing pressure burning chest.  She says she is doing fine  Past Medical History:  Diagnosis Date   Anemia    Anxiety    Arthritis    Asthma    Atrial fibrillation (HCC)    Blood transfusion without reported diagnosis    Cataract    CHF (congestive heart failure) (HCC)    Chronic kidney disease    Clotting disorder (HCC)    COPD (chronic obstructive pulmonary disease) (HCC)    Dementia (HCC)    Depression    Diabetes mellitus without complication (HCC)    Dyspnea    GERD  (gastroesophageal reflux disease)    Headache    Heart murmur    History of kidney stones    Hyperlipidemia    Hypothyroidism    Myocardial infarction (HCC)    Neuromuscular disorder (HCC)    tremors   Pneumonia due to COVID-19 virus    Seizures (HCC)    Sleep apnea    Stroke (HCC)    Thyroid disease     Past Surgical History:  Procedure Laterality Date   ABDOMINAL HYSTERECTOMY     APPENDECTOMY     bladder tack     CHOLECYSTECTOMY     CORONARY ARTERY BYPASS GRAFT     CYSTOSCOPY W/ URETERAL STENT PLACEMENT Left 05/01/2021   Procedure: CYSTOSCOPY WITH RETROGRADE PYELOGRAM/URETERAL STENT PLACEMENT;  Surgeon: Rene Paci, MD;  Location: [facility] OR;  Service: Urology;  Laterality: Left;   CYSTOSCOPY/URETEROSCOPY/HOLMIUM LASER/STENT PLACEMENT Left 08/09/2021   Procedure: CYSTOSCOPY/URETEROSCOPY/HOLMIUM LASER/STENT EXCHANGE;  Surgeon: Rene Paci, MD;  Location: [facility] ORS;  Service: Urology;  Laterality: Left;   ESOPHAGOGASTRODUODENOSCOPY ENDOSCOPY     EYE SURGERY     FRACTURE SURGERY Right    3rd finger   JOINT REPLACEMENT Bilateral    knees   TONSILLECTOMY AND ADENOIDECTOMY     TUBAL LIGATION      Current Medications: Current Meds  Medication Sig   acetaminophen (TYLENOL) 500 MG  tablet Take 500 mg by mouth every 6 (six) hours as needed for mild pain or moderate pain.   amoxicillin-clavulanate (AUGMENTIN) 500-125 MG tablet Take 1 tablet by mouth 2 (two) times daily.   aspirin 81 MG EC tablet Take 1 tablet (81 mg total) by mouth daily. Swallow whole.   budesonide (PULMICORT) 0.5 MG/2ML nebulizer solution Take 2 mLs (0.5 mg total) by nebulization in the Kelly and at bedtime.   cetirizine (ZYRTEC) 10 MG tablet Take 10 mg by mouth daily.   donepezil (ARICEPT) 10 MG tablet Take 1 tablet (10 mg total) by mouth at bedtime.   HYDROcodone-acetaminophen (NORCO/VICODIN) 5-325 MG tablet Take 1 tablet by mouth every 6 (six) hours as needed for moderate pain.    levothyroxine (SYNTHROID) 88 MCG tablet Take 88 mcg by mouth daily before breakfast.   montelukast (SINGULAIR) 10 MG tablet Take 1 tablet (10 mg total) by mouth at bedtime.   Multiple Vitamin (MULTIVITAMIN WITH MINERALS) TABS tablet Take 1 tablet by mouth daily.   omeprazole (PRILOSEC) 40 MG capsule Take 1 capsule (40 mg total) by mouth daily. (Patient taking differently: Take 40 mg by mouth 2 (two) times daily.)   oxybutynin (DITROPAN) 5 MG tablet Take 0.5 tablets (2.5 mg total) by mouth 3 (three) times daily.   PERCOCET 5-325 MG tablet Take 1 tablet by mouth every 4 (four) hours as needed for severe pain.   pravastatin (PRAVACHOL) 40 MG tablet Take 1 tablet (40 mg total) by mouth daily at 6 PM.   pregabalin (LYRICA) 50 MG capsule TAKE 1 CAPSULE BY MOUTH 2 TIMES DAILY.   Probiotic Product (PROBIOTIC PO) Take 15 Billion Cells by mouth daily.   Rivaroxaban (XARELTO) 15 MG TABS tablet Take 1 tablet (15 mg total) by mouth daily.   topiramate (TOPAMAX) 200 MG tablet Take 1 tablet (200 mg total) by mouth 2 (two) times daily.     Allergies:   Ciprofloxacin, Prochlorperazine edisylate, Morphine, Prednisone, Reduced iso-alpha acids complex, Statins, Sulfa antibiotics, Azithromycin, and Latex   Social History   Socioeconomic History   Marital status: Married    Spouse name: Vonna Kotyk   Number of children: 3   Years of education: Not on file   Highest education level: Not on file  Occupational History   Not on file  Tobacco Use   Smoking status: Former    Packs/day: 0.50    Years: 30.00    Pack years: 15.00    Types: Cigarettes    Quit date: 06/26/2000    Years since quitting: 21.2   Smokeless tobacco: Never  Vaping Use   Vaping Use: Former  Substance and Sexual Activity   Alcohol use: Not Currently   Drug use: Never   Sexual activity: Not on file  Other Topics Concern   Not on file  Social History Narrative   Not on file   Social Determinants of Health   Financial Resource Strain: Not  on file  Food Insecurity: Not on file  Transportation Needs: Not on file  Physical Activity: Not on file  Stress: Not on file  Social Connections: Not on file     Family History: The patient's family history includes Breast cancer in her mother; Heart attack in her mother; Heart disease in her mother and sister. ROS:   Please see the history of present illness.    All 14 point review of systems negative except as described per history of present illness  EKGs/Labs/Other Studies Reviewed:      Recent  Labs: 05/01/2021: B Natriuretic Peptide 493.7 05/02/2021: TSH 0.784 05/04/2021: Magnesium 2.3 07/01/2021: ALT 6 08/09/2021: BUN 14; Creatinine, Ser 1.43; Hemoglobin 13.2; Platelets 282; Potassium 3.4; Sodium 141  Recent Lipid Panel    Component Value Date/Time   CHOL 148 05/03/2021 0538   TRIG 132 05/03/2021 0538   HDL 24 (L) 05/03/2021 0538   CHOLHDL 6.2 05/03/2021 0538   VLDL 26 05/03/2021 0538   LDLCALC 98 05/03/2021 0538    Physical Exam:    VS:  BP 100/70 (BP Location: Left Arm, Patient Position: Sitting, Cuff Size: Normal)   Pulse 97   Ht 5\' 2"  (1.575 m)   Wt 135 lb (61.2 kg)   SpO2 94%   BMI 24.69 kg/m     Wt Readings from Last 3 Encounters:  09/15/21 135 lb (61.2 kg)  08/09/21 141 lb (64 kg)  07/01/21 150 lb 5.7 oz (68.2 kg)     GEN:  Well nourished, well developed in no acute distress HEENT: Normal NECK: No JVD; No carotid bruits LYMPHATICS: No lymphadenopathy CARDIAC: RRR, no murmurs, no rubs, no gallops RESPIRATORY: Bilateral fine crackles ABDOMEN: Soft, non-tender, non-distended MUSCULOSKELETAL:  No edema; No deformity  SKIN: Warm and dry LOWER EXTREMITIES: no swelling NEUROLOGIC:  Alert and oriented x 3 PSYCHIATRIC:  Normal affect   ASSESSMENT:    1. PAF (paroxysmal atrial fibrillation) (HCC)   2. Coronary artery disease involving native coronary artery of native heart without angina pectoris   3. Pulmonary fibrosis (HCC)   4. S/P CABG (coronary  artery bypass graft)    PLAN:    In order of problems listed above:  Paroxysmal atrial fibrillation maintained sinus rhythm continue anticoagulation dose of Xarelto is 50 mg which is appropriate to her GFR she is also on aspirin. Coronary disease stable denies having any symptoms that would suggest reactivation of the problem Pulmonary fibrosis noted clearly auscultated on the physical exam.   Dyslipidemia I did review K PN which show me LDL 116 HDL 37.  She is on pravastatin I will increase the dose from 40 to 80 mg daily fasting lipid profile will be repeated    Medication Adjustments/Labs and Tests Ordered: Current medicines are reviewed at length with the patient today.  Concerns regarding medicines are outlined above.  No orders of the defined types were placed in this encounter.  Medication changes: No orders of the defined types were placed in this encounter.   Signed, 08/31/21, MD, [facility] 09/15/2021 10:38 AM     [facility]

## 2021-09-15 NOTE — Patient Instructions (Signed)
Medication Instructions:  Your physician has recommended you make the following change in your medication:  Increase Pravastatin to 80 mg once daily  *If you need a refill on your cardiac medications before your next appointment, please call your pharmacy*   Lab Work: Your physician recommends that you return for lab work in: 6 weeks to have fasting lipid panel, AST and ASL Lab opens at [place] an appointment. Best time to come is between 8am and 12noon and between 1:30 and 4:30. If you have been asked to fast for your blood work please have nothing to eat or drink after midnight. You may have water.   If you have labs (blood work) drawn today and your tests are completely normal, you will receive your results only by: [place] (if you have MyChart) OR A paper copy in the mail If you have any lab test that is abnormal or we need to change your treatment, we will call you to review the results.   Testing/Procedures: NONE   Follow-Up: At [facility], you and your health needs are our priority.  As part of our continuing mission to provide you with exceptional heart care, we have created designated Provider Care Teams.  These Care Teams include your primary Cardiologist (physician) and Advanced Practice Providers (APPs -  Physician Assistants and Nurse Practitioners) who all work together to provide you with the care you need, when you need it.  We recommend signing up for the patient portal called "MyChart".  Sign up information is provided on this After Visit Summary.  MyChart is used to connect with patients for Virtual Visits (Telemedicine).  Patients are able to view lab/test results, encounter notes, upcoming appointments, etc.  Non-urgent messages can be sent to your provider as well.   To learn more about what you can do with MyChart, go to [email or web address].    Your next appointment:   5 month(s)  The format for your next appointment:   In  Person  Provider:   Jenne Campus, MD    Other Instructions   Important Information About Sugar

## 2021-09-22 ENCOUNTER — Other Ambulatory Visit: Payer: Self-pay | Admitting: Cardiology

## 2021-09-22 DIAGNOSIS — I480 Paroxysmal atrial fibrillation: Secondary | ICD-10-CM

## 2021-09-23 ENCOUNTER — Other Ambulatory Visit: Payer: Self-pay | Admitting: Physical Medicine and Rehabilitation

## 2021-09-25 DIAGNOSIS — N201 Calculus of ureter: Secondary | ICD-10-CM | POA: Diagnosis not present

## 2021-09-25 DIAGNOSIS — R8279 Other abnormal findings on microbiological examination of urine: Secondary | ICD-10-CM | POA: Diagnosis not present

## 2021-09-27 ENCOUNTER — Encounter: Payer: Self-pay | Admitting: Internal Medicine

## 2021-10-05 ENCOUNTER — Encounter: Payer: Self-pay | Admitting: Adult Health

## 2021-10-05 ENCOUNTER — Ambulatory Visit (INDEPENDENT_AMBULATORY_CARE_PROVIDER_SITE_OTHER): Payer: Medicare HMO | Admitting: Adult Health

## 2021-10-05 VITALS — BP 130/71 | HR 97 | Ht 62.0 in | Wt 129.0 lb

## 2021-10-05 DIAGNOSIS — G8194 Hemiplegia, unspecified affecting left nondominant side: Secondary | ICD-10-CM | POA: Diagnosis not present

## 2021-10-05 DIAGNOSIS — I480 Paroxysmal atrial fibrillation: Secondary | ICD-10-CM | POA: Diagnosis not present

## 2021-10-05 DIAGNOSIS — I63311 Cerebral infarction due to thrombosis of right middle cerebral artery: Secondary | ICD-10-CM

## 2021-10-05 NOTE — Progress Notes (Signed)
[facility] [address] [place]. [place] [address] [phone]       STROKE FOLLOW UP NOTE  [name] Date of Birth:  [birth year] Medical Record Number:  [id number]   Reason for Referral: stroke follow up    SUBJECTIVE:   CHIEF COMPLAINT:  Chief Complaint  Patient presents with   Follow-up    Rm 3 with spouse Jenny Kelly  Pt is well and stable, no new concerns     HPI:   Update 10/05/2021 JM: Patient returns for stroke follow-up visit after prior visit 4 months ago.  She is accompanied by her husband.  Overall stable without new stroke/TIA symptoms.  Reports residual left sided weakness and gait impairment. She has since completed HH therapies. Did receive injection in left shoulder, helped pain some. Continues to do exercises at home. Continues to use RW, no recent falls. Does have chronic knee pain. At times, can have difficulty swallowing which is not new, does have hx of esophageal stricture.  Has very supportive husband who helps assist with ADLs.  Cognition overall stable, remains on Aricept per PCP. She does have a history of seizures, she has remained on topiramate managed by PCP.  Compliant on Xarelto, aspirin and pravastatin, denies side effects.  Blood pressure today 130/71.  Routinely follows with PCP and cardiology.    Completed lithotripsy with stent replacement 4/19.  She has been struggling with recurrent UTIs and pneumonia since procedure. Continues to follow with urology   No further concerns at this time     History provided for reference purposes only Initial visit 06/07/2021 JM: Patient being seen for initial hospital follow-up accompanied by her husband, Jenny Kelly.  She was discharged home from [facility] on 1/24 with recommendations of HH PT/OT. Husband assists with history. Reports continued left-sided weakness, left arm pain, dysphagia and slight decline of cognition from baseline.  Currently working with [facility] PT/OT - does note improvement of  weakness and gait. Use of rollator walker for longer distance but does not need for short distance.  No recent falls.  Does complain of left shoulder pain.  Maintains a softer diet as difficulty swallowing larger chewier foods.  Occasional difficulty with thin liquids.  Not currently working with SLP (declined SLP at [facility] d/c). Husband does assist with ADLs but has been gradually doing more things on her own (suspect husband assisting more than needed). Does have confusion at times - remains on Aricept which she was on PTA. Denies new stroke/TIA symptoms. Per husband, on topiramate for history of seizures and pregabalin for headaches managed by PCP. Reports compliance on Xarelto, aspirin and pravastatin without side effects.  Blood pressure today 147/71.  She has since had follow-up with PCP and scheduled for PMR follow-up 3/7. She has since seen neurologist at [facility] urology - per husband, they plan on replacing current ureteral stent, wishing to do this ASAP, and questions holding Xarelto and aspirin.  Previously followed by cardiology clinic in [place] but has not had any recent follow-up.  No further concerns at this time.  Stroke admission 05/01/2021 Jenny Kelly is a 76 y.o. female with pertinent history of a.fib (xarelto), Anemia, CHF, Chronic kidney disease, COPD, Diabetes mellitus without complication, Hyperlipidemia, Myocardial infarction, Neuromuscular disorder, Seizures, Sleep apnea, hx of Stroke and Thyroid disease who presented to [facility] on 05/01/2021 with nonsensical speech, left facial weakness, lethargy and weakness. Eval by telenurology - not a candidate for TNK due to anticoagulation. CTA head/neck showed acute right  M1 occlusion and severe stenosis of right M2 distally and transferred to [facility] for CTP and possible thrombectomy.  Personally reviewed hospitalization pertinent progress notes, lab work and imaging.  Neuro and IR decided against mechanical thrombectomy given small  penumbra.  Evaluated by Dr. Roda Shutters for right MCA infarcts (involving the insula, anterior temporal lobe and inferior frontal lobe) likely due to right M1 proximal M2 high-grade stenosis/occlusion in setting of sepsis, septic shock and hypotension.  EF 60 to 65%.  LDL 98.  A1c 5.9.  On Xarelto PTA for A-fib continuation in addition to aspirin 81 mg daily. Noted results of bradycardia and hypotension eventually stabilized with BP goal 120-1 50 given stenosis.  History of statin intolerance therefore recommended low intensity pravastatin 40 mg daily.  Multiple other stroke risk factors include advanced age, former tobacco use, hemorrhagic stroke 2009, CAD, MR s/p stent c/b pulmonary emboli 2002 and triple bypass cardiac surgery 2015, migraine headaches, OSA on CPAP and CHF.  Hospital course significant for for ureteral stone with UTI s/p cystoscopy with left ureteral stent placement and left retrograde pyelogram, respiratory distress requiring BiPAP and nebulizers, hypotension and bradycardia and pancreatic ductal dilation.  Discharged to [facility] on 1/15 per therapy recommendations.     PERTINENT IMAGING  Per hospitalization 05/01/2021 CTA head & neck acute right M1 occlusion and severe stenosis of the right M2 more distally CT perfusion 13 mL infarct core, involving the right MCA territory in the right posterior temporal, posterior frontal, and anterior parietal lobes. MRI  Acute infarcts involving the insula, anterior temporal lobe, and inferior frontal lobe, consistent with known recent right MCA occlusion. Additional areas of restricted diffusion in the caudate and lentiform nucleus appear slightly less hyperintense on diffusion-weighted imaging, possibly slightly less acute infarcts.  2D Echo EF 60-65% LDL 98 HgbA1c 5.9    ROS:   14 system review of systems performed and negative with exception of those listed in HPI  PMH:  Past Medical History:  Diagnosis Date   Anemia    Anxiety    Arthritis     Asthma    Atrial fibrillation (HCC)    Blood transfusion without reported diagnosis    Cataract    CHF (congestive heart failure) (HCC)    Chronic kidney disease    Clotting disorder (HCC)    COPD (chronic obstructive pulmonary disease) (HCC)    Dementia (HCC)    Depression    Diabetes mellitus without complication (HCC)    Dyspnea    GERD (gastroesophageal reflux disease)    Headache    Heart murmur    History of kidney stones    Hyperlipidemia    Hypothyroidism    Myocardial infarction (HCC)    Neuromuscular disorder (HCC)    tremors   Pneumonia due to COVID-19 virus    Seizures (HCC)    Sleep apnea    Stroke (HCC)    Thyroid disease     PSH:  Past Surgical History:  Procedure Laterality Date   ABDOMINAL HYSTERECTOMY     APPENDECTOMY     bladder tack     CHOLECYSTECTOMY     CORONARY ARTERY BYPASS GRAFT     CYSTOSCOPY W/ URETERAL STENT PLACEMENT Left 05/01/2021   Procedure: CYSTOSCOPY WITH RETROGRADE PYELOGRAM/URETERAL STENT PLACEMENT;  Surgeon: Rene Paci, MD;  Location: [facility] OR;  Service: Urology;  Laterality: Left;   CYSTOSCOPY/URETEROSCOPY/HOLMIUM LASER/STENT PLACEMENT Left 08/09/2021   Procedure: CYSTOSCOPY/URETEROSCOPY/HOLMIUM LASER/STENT EXCHANGE;  Surgeon: Rene Paci, MD;  Location: [facility] ORS;  Service: Urology;  Laterality: Left;   ESOPHAGOGASTRODUODENOSCOPY ENDOSCOPY     EYE SURGERY     FRACTURE SURGERY Right    3rd finger   JOINT REPLACEMENT Bilateral    knees   TONSILLECTOMY AND ADENOIDECTOMY     TUBAL LIGATION      Social History:  Social History   Socioeconomic History   Marital status: Married    Spouse name: Ulice Dash   Number of children: 3   Years of education: Not on file   Highest education level: Not on file  Occupational History   Not on file  Tobacco Use   Smoking status: Former    Packs/day: 0.50    Years: 30.00    Total pack years: 15.00    Types: Cigarettes    Quit date: 06/26/2000    Years since quitting:  21.2   Smokeless tobacco: Never  Vaping Use   Vaping Use: Former  Substance and Sexual Activity   Alcohol use: Not Currently   Drug use: Never   Sexual activity: Not on file  Other Topics Concern   Not on file  Social History Narrative   Not on file   Social Determinants of Health   Financial Resource Strain: Not on file  Food Insecurity: Not on file  Transportation Needs: Not on file  Physical Activity: Not on file  Stress: Not on file  Social Connections: Not on file  Intimate Partner Violence: Not on file    Family History:  Family History  Problem Relation Age of Onset   Heart disease Mother    Heart attack Mother    Breast cancer Mother    Heart disease Sister     Medications:   Current Outpatient Medications on File Prior to Visit  Medication Sig Dispense Refill   acetaminophen (TYLENOL) 500 MG tablet Take 500 mg by mouth every 6 (six) hours as needed for mild pain or moderate pain.     aspirin 81 MG EC tablet Take 1 tablet (81 mg total) by mouth daily. Swallow whole. 30 tablet 0   budesonide (PULMICORT) 0.5 MG/2ML nebulizer solution Take 2 mLs (0.5 mg total) by nebulization in the morning and at bedtime. 120 mL 0   cetirizine (ZYRTEC) 10 MG tablet Take 10 mg by mouth daily.     donepezil (ARICEPT) 10 MG tablet Take 1 tablet (10 mg total) by mouth at bedtime. 30 tablet 0   HYDROcodone-acetaminophen (NORCO/VICODIN) 5-325 MG tablet Take 1 tablet by mouth every 6 (six) hours as needed for moderate pain.     levothyroxine (SYNTHROID) 88 MCG tablet Take 88 mcg by mouth daily before breakfast.     montelukast (SINGULAIR) 10 MG tablet Take 1 tablet (10 mg total) by mouth at bedtime. 30 tablet 0   Multiple Vitamin (MULTIVITAMIN WITH MINERALS) TABS tablet Take 1 tablet by mouth daily.     omeprazole (PRILOSEC) 40 MG capsule Take 1 capsule (40 mg total) by mouth daily. (Patient taking differently: Take 40 mg by mouth 2 (two) times daily.) 30 capsule 0   oxybutynin (DITROPAN)  5 MG tablet Take 0.5 tablets (2.5 mg total) by mouth 3 (three) times daily. 45 tablet 0   PERCOCET 5-325 MG tablet Take 1 tablet by mouth every 4 (four) hours as needed for severe pain.     pravastatin (PRAVACHOL) 80 MG tablet Take 1 tablet (80 mg total) by mouth every evening. 90 tablet 0   pregabalin (LYRICA) 50 MG capsule TAKE 1 CAPSULE BY MOUTH 2  TIMES DAILY. 60 capsule 0   Probiotic Product (PROBIOTIC PO) Take 15 Billion Cells by mouth daily.     Rivaroxaban (XARELTO) 15 MG TABS tablet Take 1 tablet (15 mg total) by mouth daily. 90 tablet 3   topiramate (TOPAMAX) 200 MG tablet Take 1 tablet (200 mg total) by mouth 2 (two) times daily. 60 tablet 0   No current facility-administered medications on file prior to visit.    Allergies:   Allergies  Allergen Reactions   Ciprofloxacin Hives    Ask patient   Prochlorperazine Edisylate Other (See Comments)    Cant swallow Muscle cramping   Morphine Other (See Comments)    Confusion   Prednisone Other (See Comments)    unknown   Reduced Iso-Alpha Acids Complex Other (See Comments)    unknown   Statins Other (See Comments)    Muscle weakness    Sulfa Antibiotics Swelling   Azithromycin Rash   Latex Rash      OBJECTIVE:  Physical Exam  Vitals:   10/05/21 1343  BP: 130/71  Pulse: 97  Weight: 129 lb (58.5 kg)  Height: 5\' 2"  (1.575 m)    Body mass index is 23.59 kg/m. No results found.  General: well developed, well nourished, very pleasant elderly Caucasian female, seated, in no evident distress Head: head normocephalic and atraumatic.   Neck: supple with no carotid or supraclavicular bruits Cardiovascular: regular rate and rhythm, no murmurs Musculoskeletal: no deformity Skin:  no rash/petichiae Vascular:  Normal pulses all extremities   Neurologic Exam Mental Status: Awake and fully alert.  Mild dysarthria although poor denture noted. Delayed responses but able to follow commands and answer questions appropriately.   Unable to appreciate aphasia.  Disoriented to place and year (able to state month). Recent and remote memory impaired. Attention span, concentration and fund of knowledge impaired with husband providing majority of history. Mood and affect appropriate.  Cranial Nerves:  Pupils equal, briskly reactive to light. Extraocular movements full without nystagmus. Visual fields full to confrontation. HOH bilaterally. Facial sensation intact.  Left nasolabial fold flattening.  Tongue, palate moves normally and symmetrically.  Motor:  LUE: 3/5 deltoid (although limited eval due to shoulder pain), 4/5 elbow extension, 3/5 elbow flexion, 3+/5 grip strength. Decreased shoulder ROM d/t pain LLE: 4-/5 although exam limited due to knee pain RUE: 4+/5 RLE: 4+/5 Sensory.:  Reports decreased light touch sensation LUE and LLE compared to right side  Coordination: Rapid alternating movements normal in all extremities except decreased left hand. Finger-to-nose and heel-to-shin performed on right side. Gait and Station: Arises from chair with mild difficulty. Stance is slightly hunched. Gait demonstrates normal stride length with mild favoring of left leg and bent knee with use of rollator walker. Tandem walk and heel toe not attempted  Reflexes: 1+ and symmetric. Toes downgoing.               ASSESSMENT: Jenny Kelly is a 76 y.o. year old female with right MCA infarcts (involving insula, anterior temporal lobe and inferior frontal lobe) on 05/01/2021 likely due to right M1 and proximal stenosis/occlusion in setting of sepsis, septic shock and hypotension in setting of UTI s/p left ureteral stent for ureteral stone. Vascular risk factors include A-fib on Xarelto, HLD, former tobacco use, history of hemorrhagic stroke 2009 without residual deficit, CAD 2002 with MI and triple bypass 2015, migraines (managed by PCP) and CHF.     PLAN:  R MCA strokes :  Residual deficit: left hemiparesis with  sensory  impairment and gait impairment.  Continue exercises at home.  Continued use of RW at all times for fall prevention.   Continue aspirin 81 mg daily and Xarelto (rivaroxaban) daily  and pravastatin 40 mg daily for secondary stroke prevention.   Discussed secondary stroke prevention measures and importance of close PCP follow up for aggressive stroke risk factor management including HLD with LDL goal<70.  I have gone over the pathophysiology of stroke, warning signs and symptoms, risk factors and their management in some detail with instructions to go to the closest emergency room for symptoms of concern. Atrial fibrillation: On Xarelto chronically.  Continue to follow with cardiology for monitoring and management    Doing well from stroke standpoint without further recommendations and risk factors are managed by PCP. She may follow up PRN, as usual for our patients who are strictly being followed for stroke. If any new neurological issues should arise, request PCP place referral for evaluation by one of our neurologists. Thank you.     CC:  PCP: Townsend Roger, MD     I spent 34 minutes of face-to-face and non-face-to-face time with patient and husband.  This included previsit chart review, lab review, study review, electronic health record documentation, patient and husband's education regarding prior stroke and residual deficits, secondary stroke prevention measures and importance of managing stroke risk factors and answered all other questions as noted above to patient and husband's satisfaction  Frann Rider, AGNP-BC  [facility] [address] [place], [place] [address]  Phone [phone] Fax [phone] Note: This document was prepared with digital dictation and possible smart phrase technology. Any transcriptional errors that result from this process are unintentional.

## 2021-10-05 NOTE — Patient Instructions (Signed)
As you have been doing well from a stroke standpoint, you can follow-up on an as-needed basis    Recommendations:  Continue aspirin 81 mg daily and Xarelto (rivaroxaban) daily  and pravastatin for secondary stroke prevention and for history of atrial fibrillation  Continue to follow up with PCP regarding cholesterol and blood pressure management  Maintain strict control of hypertension with blood pressure goal below 130/90 and cholesterol with LDL cholesterol (bad cholesterol) goal below 70 mg/dL.   Signs of a Stroke? Follow the BEFAST method:  Balance Watch for a sudden loss of balance, trouble with coordination or vertigo Eyes Is there a sudden loss of vision in one or both eyes? Or double vision?  Face: Ask the person to smile. Does one side of the face droop or is it numb?  Arms: Ask the person to raise both arms. Does one arm drift downward? Is there weakness or numbness of a leg? Speech: Ask the person to repeat a simple phrase. Does the speech sound slurred/strange? Is the person confused ? Time: If you observe any of these signs, call [phone].         Thank you for coming to see [place] at [facility]. I hope we have been able to provide you high quality care today.  You may receive a patient satisfaction survey over the next few weeks. We would appreciate your feedback and comments so that we may continue to improve ourselves and the health of our patients.

## 2021-11-01 ENCOUNTER — Emergency Department (HOSPITAL_COMMUNITY): Payer: Medicare HMO

## 2021-11-01 ENCOUNTER — Inpatient Hospital Stay (HOSPITAL_COMMUNITY)
Admission: EM | Admit: 2021-11-01 | Discharge: 2021-11-04 | DRG: 871 | Disposition: A | Discharge status: Home / Self Care | Payer: Medicare HMO | Attending: Internal Medicine | Admitting: Internal Medicine

## 2021-11-01 ENCOUNTER — Encounter (HOSPITAL_COMMUNITY): Payer: Self-pay | Admitting: Emergency Medicine

## 2021-11-01 ENCOUNTER — Other Ambulatory Visit: Payer: Self-pay

## 2021-11-01 DIAGNOSIS — I480 Paroxysmal atrial fibrillation: Secondary | ICD-10-CM | POA: Diagnosis present

## 2021-11-01 DIAGNOSIS — K219 Gastro-esophageal reflux disease without esophagitis: Secondary | ICD-10-CM | POA: Diagnosis present

## 2021-11-01 DIAGNOSIS — Z66 Do not resuscitate: Secondary | ICD-10-CM | POA: Diagnosis present

## 2021-11-01 DIAGNOSIS — M19072 Primary osteoarthritis, left ankle and foot: Secondary | ICD-10-CM | POA: Diagnosis not present

## 2021-11-01 DIAGNOSIS — M109 Gout, unspecified: Secondary | ICD-10-CM | POA: Diagnosis present

## 2021-11-01 DIAGNOSIS — I5032 Chronic diastolic (congestive) heart failure: Secondary | ICD-10-CM | POA: Diagnosis present

## 2021-11-01 DIAGNOSIS — G9341 Metabolic encephalopathy: Secondary | ICD-10-CM | POA: Diagnosis present

## 2021-11-01 DIAGNOSIS — E872 Acidosis, unspecified: Secondary | ICD-10-CM | POA: Diagnosis present

## 2021-11-01 DIAGNOSIS — Z20822 Contact with and (suspected) exposure to covid-19: Secondary | ICD-10-CM | POA: Diagnosis present

## 2021-11-01 DIAGNOSIS — Z8616 Personal history of COVID-19: Secondary | ICD-10-CM | POA: Diagnosis not present

## 2021-11-01 DIAGNOSIS — G40909 Epilepsy, unspecified, not intractable, without status epilepticus: Secondary | ICD-10-CM | POA: Diagnosis present

## 2021-11-01 DIAGNOSIS — J470 Bronchiectasis with acute lower respiratory infection: Secondary | ICD-10-CM | POA: Diagnosis not present

## 2021-11-01 DIAGNOSIS — J8410 Pulmonary fibrosis, unspecified: Secondary | ICD-10-CM | POA: Diagnosis present

## 2021-11-01 DIAGNOSIS — G4730 Sleep apnea, unspecified: Secondary | ICD-10-CM | POA: Diagnosis present

## 2021-11-01 DIAGNOSIS — R569 Unspecified convulsions: Secondary | ICD-10-CM

## 2021-11-01 DIAGNOSIS — Z8249 Family history of ischemic heart disease and other diseases of the circulatory system: Secondary | ICD-10-CM

## 2021-11-01 DIAGNOSIS — Z951 Presence of aortocoronary bypass graft: Secondary | ICD-10-CM

## 2021-11-01 DIAGNOSIS — Z882 Allergy status to sulfonamides status: Secondary | ICD-10-CM

## 2021-11-01 DIAGNOSIS — A419 Sepsis, unspecified organism: Secondary | ICD-10-CM | POA: Diagnosis not present

## 2021-11-01 DIAGNOSIS — E785 Hyperlipidemia, unspecified: Secondary | ICD-10-CM | POA: Diagnosis not present

## 2021-11-01 DIAGNOSIS — R0602 Shortness of breath: Secondary | ICD-10-CM | POA: Diagnosis not present

## 2021-11-01 DIAGNOSIS — E7800 Pure hypercholesterolemia, unspecified: Secondary | ICD-10-CM | POA: Diagnosis present

## 2021-11-01 DIAGNOSIS — J180 Bronchopneumonia, unspecified organism: Secondary | ICD-10-CM | POA: Diagnosis present

## 2021-11-01 DIAGNOSIS — Z91040 Latex allergy status: Secondary | ICD-10-CM

## 2021-11-01 DIAGNOSIS — R9431 Abnormal electrocardiogram [ECG] [EKG]: Secondary | ICD-10-CM | POA: Diagnosis not present

## 2021-11-01 DIAGNOSIS — M79675 Pain in left toe(s): Secondary | ICD-10-CM

## 2021-11-01 DIAGNOSIS — R059 Cough, unspecified: Secondary | ICD-10-CM | POA: Diagnosis not present

## 2021-11-01 DIAGNOSIS — J189 Pneumonia, unspecified organism: Secondary | ICD-10-CM | POA: Diagnosis not present

## 2021-11-01 DIAGNOSIS — R0902 Hypoxemia: Secondary | ICD-10-CM | POA: Diagnosis not present

## 2021-11-01 DIAGNOSIS — Z79899 Other long term (current) drug therapy: Secondary | ICD-10-CM

## 2021-11-01 DIAGNOSIS — N179 Acute kidney failure, unspecified: Secondary | ICD-10-CM | POA: Diagnosis present

## 2021-11-01 DIAGNOSIS — E878 Other disorders of electrolyte and fluid balance, not elsewhere classified: Secondary | ICD-10-CM | POA: Diagnosis present

## 2021-11-01 DIAGNOSIS — I69391 Dysphagia following cerebral infarction: Secondary | ICD-10-CM

## 2021-11-01 DIAGNOSIS — F32A Depression, unspecified: Secondary | ICD-10-CM | POA: Diagnosis present

## 2021-11-01 DIAGNOSIS — K5641 Fecal impaction: Secondary | ICD-10-CM | POA: Diagnosis not present

## 2021-11-01 DIAGNOSIS — N1832 Chronic kidney disease, stage 3b: Secondary | ICD-10-CM | POA: Diagnosis present

## 2021-11-01 DIAGNOSIS — R1319 Other dysphagia: Secondary | ICD-10-CM | POA: Diagnosis present

## 2021-11-01 DIAGNOSIS — E1122 Type 2 diabetes mellitus with diabetic chronic kidney disease: Secondary | ICD-10-CM | POA: Diagnosis present

## 2021-11-01 DIAGNOSIS — R109 Unspecified abdominal pain: Secondary | ICD-10-CM | POA: Diagnosis not present

## 2021-11-01 DIAGNOSIS — Z87891 Personal history of nicotine dependence: Secondary | ICD-10-CM

## 2021-11-01 DIAGNOSIS — Z7982 Long term (current) use of aspirin: Secondary | ICD-10-CM

## 2021-11-01 DIAGNOSIS — J9611 Chronic respiratory failure with hypoxia: Secondary | ICD-10-CM | POA: Diagnosis not present

## 2021-11-01 DIAGNOSIS — Z888 Allergy status to other drugs, medicaments and biological substances status: Secondary | ICD-10-CM

## 2021-11-01 DIAGNOSIS — I69351 Hemiplegia and hemiparesis following cerebral infarction affecting right dominant side: Secondary | ICD-10-CM

## 2021-11-01 DIAGNOSIS — I5030 Unspecified diastolic (congestive) heart failure: Secondary | ICD-10-CM | POA: Diagnosis present

## 2021-11-01 DIAGNOSIS — R6520 Severe sepsis without septic shock: Secondary | ICD-10-CM | POA: Diagnosis present

## 2021-11-01 DIAGNOSIS — F419 Anxiety disorder, unspecified: Secondary | ICD-10-CM | POA: Diagnosis present

## 2021-11-01 DIAGNOSIS — F03918 Unspecified dementia, unspecified severity, with other behavioral disturbance: Secondary | ICD-10-CM | POA: Diagnosis not present

## 2021-11-01 DIAGNOSIS — E039 Hypothyroidism, unspecified: Secondary | ICD-10-CM | POA: Diagnosis present

## 2021-11-01 DIAGNOSIS — Z8673 Personal history of transient ischemic attack (TIA), and cerebral infarction without residual deficits: Secondary | ICD-10-CM

## 2021-11-01 DIAGNOSIS — I6930 Unspecified sequelae of cerebral infarction: Secondary | ICD-10-CM

## 2021-11-01 DIAGNOSIS — Z7951 Long term (current) use of inhaled steroids: Secondary | ICD-10-CM

## 2021-11-01 DIAGNOSIS — I252 Old myocardial infarction: Secondary | ICD-10-CM

## 2021-11-01 DIAGNOSIS — Z7901 Long term (current) use of anticoagulants: Secondary | ICD-10-CM

## 2021-11-01 HISTORY — DX: Unspecified sequelae of cerebral infarction: I69.30

## 2021-11-01 HISTORY — DX: Pain in left toe(s): M79.675

## 2021-11-01 LAB — COMPREHENSIVE METABOLIC PANEL
ALT: 13 U/L (ref 0–44)
AST: 19 U/L (ref 15–41)
Albumin: 3 g/dL — ABNORMAL LOW (ref 3.5–5.0)
Alkaline Phosphatase: 102 U/L (ref 38–126)
Anion gap: 13 (ref 5–15)
BUN: 17 mg/dL (ref 8–23)
CO2: 19 mmol/L — ABNORMAL LOW (ref 22–32)
Calcium: 9.1 mg/dL (ref 8.9–10.3)
Chloride: 110 mmol/L (ref 98–111)
Creatinine, Ser: 1.47 mg/dL — ABNORMAL HIGH (ref 0.44–1.00)
GFR, Estimated: 37 mL/min — ABNORMAL LOW (ref >60)
Glucose, Bld: 153 mg/dL — ABNORMAL HIGH (ref 70–99)
Potassium: 3.8 mmol/L (ref 3.5–5.1)
Sodium: 142 mmol/L (ref 135–145)
Total Bilirubin: 0.6 mg/dL (ref 0.3–1.2)
Total Protein: 7.8 g/dL (ref 6.5–8.1)

## 2021-11-01 LAB — CBC WITH DIFFERENTIAL/PLATELET
Abs Immature Granulocytes: 0.15 10*3/uL — ABNORMAL HIGH (ref 0.00–0.07)
Basophils Absolute: 0.1 10*3/uL (ref 0.0–0.1)
Basophils Relative: 0 %
Eosinophils Absolute: 0.1 10*3/uL (ref 0.0–0.5)
Eosinophils Relative: 1 %
HCT: 44.7 % (ref 36.0–46.0)
Hemoglobin: 13.7 g/dL (ref 12.0–15.0)
Immature Granulocytes: 1 %
Lymphocytes Relative: 11 %
Lymphs Abs: 2.4 10*3/uL (ref 0.7–4.0)
MCH: 27.6 pg (ref 26.0–34.0)
MCHC: 30.6 g/dL (ref 30.0–36.0)
MCV: 90.1 fL (ref 80.0–100.0)
Monocytes Absolute: 1.5 10*3/uL — ABNORMAL HIGH (ref 0.1–1.0)
Monocytes Relative: 7 %
Neutro Abs: 18 10*3/uL — ABNORMAL HIGH (ref 1.7–7.7)
Neutrophils Relative %: 80 %
Platelets: 314 10*3/uL (ref 150–400)
RBC: 4.96 MIL/uL (ref 3.87–5.11)
RDW: 17.3 % — ABNORMAL HIGH (ref 11.5–15.5)
WBC: 22.2 10*3/uL — ABNORMAL HIGH (ref 4.0–10.5)
nRBC: 0 % (ref 0.0–0.2)

## 2021-11-01 LAB — URINALYSIS, ROUTINE W REFLEX MICROSCOPIC
Bilirubin Urine: NEGATIVE
Glucose, UA: NEGATIVE mg/dL
Hgb urine dipstick: NEGATIVE
Ketones, ur: NEGATIVE mg/dL
Leukocytes,Ua: NEGATIVE
Nitrite: NEGATIVE
Protein, ur: NEGATIVE mg/dL
Specific Gravity, Urine: 1.013 (ref 1.005–1.030)
pH: 5 (ref 5.0–8.0)

## 2021-11-01 LAB — MRSA NEXT GEN BY PCR, NASAL: MRSA by PCR Next Gen: NOT DETECTED

## 2021-11-01 LAB — RESP PANEL BY RT-PCR (FLU A&B, COVID) ARPGX2
Influenza A by PCR: NEGATIVE
Influenza B by PCR: NEGATIVE
SARS Coronavirus 2 by RT PCR: NEGATIVE

## 2021-11-01 LAB — APTT: aPTT: 36 seconds (ref 24–36)

## 2021-11-01 LAB — PROTIME-INR
INR: 1.4 — ABNORMAL HIGH (ref 0.8–1.2)
Prothrombin Time: 16.8 seconds — ABNORMAL HIGH (ref 11.4–15.2)

## 2021-11-01 LAB — PROCALCITONIN: Procalcitonin: 0.13 ng/mL

## 2021-11-01 LAB — URIC ACID: Uric Acid, Serum: 7.7 mg/dL — ABNORMAL HIGH (ref 2.5–7.1)

## 2021-11-01 LAB — LACTIC ACID, PLASMA
Lactic Acid, Venous: 2.1 mmol/L (ref 0.5–1.9)
Lactic Acid, Venous: 1.7 mmol/L (ref 0.5–1.9)

## 2021-11-01 LAB — STREP PNEUMONIAE URINARY ANTIGEN: Strep Pneumo Urinary Antigen: NEGATIVE

## 2021-11-01 MED ORDER — ALBUTEROL SULFATE (2.5 MG/3ML) 0.083% IN NEBU: 2.5000 mg | INHALATION_SOLUTION | Freq: Four times a day (QID) | RESPIRATORY_TRACT | Status: DC | PRN

## 2021-11-01 MED ORDER — ONDANSETRON HCL 4 MG PO TABS: 4.0000 mg | ORAL_TABLET | Freq: Four times a day (QID) | ORAL | Status: DC | PRN

## 2021-11-01 MED ORDER — TOPIRAMATE 25 MG PO TABS
200.0000 mg | ORAL_TABLET | Freq: Two times a day (BID) | ORAL | Status: DC
  Administered 2021-11-01 – 2021-11-04 (×7): 200 mg via ORAL
  Filled 2021-11-01 (×7): qty 8

## 2021-11-01 MED ORDER — ASPIRIN 81 MG PO TBEC
81.0000 mg | DELAYED_RELEASE_TABLET | Freq: Every day | ORAL | Status: DC
  Administered 2021-11-01 – 2021-11-04 (×4): 81 mg via ORAL
  Filled 2021-11-01 (×4): qty 1

## 2021-11-01 MED ORDER — METRONIDAZOLE 500 MG/100ML IV SOLN
500.0000 mg | Freq: Once | INTRAVENOUS | Status: AC
  Administered 2021-11-01: 500 mg via INTRAVENOUS
  Filled 2021-11-01: qty 100

## 2021-11-01 MED ORDER — LACTATED RINGERS IV BOLUS (SEPSIS)
1000.0000 mL | Freq: Once | INTRAVENOUS | Status: AC
  Administered 2021-11-01: 1000 mL via INTRAVENOUS

## 2021-11-01 MED ORDER — LACTATED RINGERS IV SOLN: INTRAVENOUS | Status: DC

## 2021-11-01 MED ORDER — BUDESONIDE 0.5 MG/2ML IN SUSP
0.5000 mg | Freq: Two times a day (BID) | RESPIRATORY_TRACT | Status: DC
Start: 2021-11-01 — End: 2021-11-04
  Administered 2021-11-01 – 2021-11-04 (×6): 0.5 mg via RESPIRATORY_TRACT
  Filled 2021-11-01 (×6): qty 2

## 2021-11-01 MED ORDER — MAGNESIUM HYDROXIDE 400 MG/5ML PO SUSP
300.0000 mL | Freq: Once | ORAL | Status: DC
  Filled 2021-11-01: qty 90

## 2021-11-01 MED ORDER — VANCOMYCIN HCL 1250 MG/250ML IV SOLN
1250.0000 mg | Freq: Once | INTRAVENOUS | Status: AC
Start: 2021-11-01 — End: 2021-11-01
  Administered 2021-11-01: 1250 mg via INTRAVENOUS
  Filled 2021-11-01: qty 250

## 2021-11-01 MED ORDER — RISAQUAD PO CAPS
1.0000 | ORAL_CAPSULE | Freq: Every day | ORAL | Status: DC
  Administered 2021-11-01 – 2021-11-04 (×4): 1 via ORAL
  Filled 2021-11-01 (×5): qty 1

## 2021-11-01 MED ORDER — SODIUM CHLORIDE 0.9 % IV SOLN
2.0000 g | INTRAVENOUS | Status: DC
  Administered 2021-11-01 – 2021-11-03 (×3): 2 g via INTRAVENOUS
  Filled 2021-11-01 (×3): qty 20

## 2021-11-01 MED ORDER — RIVAROXABAN 15 MG PO TABS
15.0000 mg | ORAL_TABLET | Freq: Every day | ORAL | Status: DC
  Administered 2021-11-01 – 2021-11-04 (×4): 15 mg via ORAL
  Filled 2021-11-01 (×4): qty 1

## 2021-11-01 MED ORDER — LEVOTHYROXINE SODIUM 88 MCG PO TABS
88.0000 ug | ORAL_TABLET | Freq: Every day | ORAL | Status: DC
  Administered 2021-11-02 – 2021-11-04 (×3): 88 ug via ORAL
  Filled 2021-11-01 (×3): qty 1

## 2021-11-01 MED ORDER — PROBIOTIC 250 MG PO CAPS: ORAL_CAPSULE | Freq: Every day | ORAL | Status: DC

## 2021-11-01 MED ORDER — SODIUM CHLORIDE 0.9 % IV SOLN
100.0000 mg | Freq: Two times a day (BID) | INTRAVENOUS | Status: DC
  Administered 2021-11-01 – 2021-11-04 (×7): 100 mg via INTRAVENOUS
  Filled 2021-11-01 (×8): qty 100

## 2021-11-01 MED ORDER — PREGABALIN 25 MG PO CAPS
50.0000 mg | ORAL_CAPSULE | Freq: Two times a day (BID) | ORAL | Status: DC
  Administered 2021-11-01 – 2021-11-04 (×7): 50 mg via ORAL
  Filled 2021-11-01 (×7): qty 2

## 2021-11-01 MED ORDER — ADULT MULTIVITAMIN W/MINERALS CH
1.0000 | ORAL_TABLET | Freq: Every day | ORAL | Status: DC
  Administered 2021-11-01 – 2021-11-04 (×4): 1 via ORAL
  Filled 2021-11-01 (×4): qty 1

## 2021-11-01 MED ORDER — SODIUM CHLORIDE 0.9% FLUSH
3.0000 mL | Freq: Two times a day (BID) | INTRAVENOUS | Status: DC
  Administered 2021-11-01 – 2021-11-04 (×7): 3 mL via INTRAVENOUS

## 2021-11-01 MED ORDER — VANCOMYCIN HCL IN DEXTROSE 1-5 GM/200ML-% IV SOLN: 1000.0000 mg | Freq: Once | INTRAVENOUS | Status: DC

## 2021-11-01 MED ORDER — SENNOSIDES-DOCUSATE SODIUM 8.6-50 MG PO TABS
1.0000 | ORAL_TABLET | Freq: Every day | ORAL | Status: DC
  Administered 2021-11-01 – 2021-11-02 (×2): 1 via ORAL
  Filled 2021-11-01 (×2): qty 1

## 2021-11-01 MED ORDER — ONDANSETRON HCL 4 MG/2ML IJ SOLN: 4.0000 mg | Freq: Four times a day (QID) | INTRAMUSCULAR | Status: DC | PRN

## 2021-11-01 MED ORDER — GUAIFENESIN ER 600 MG PO TB12
600.0000 mg | ORAL_TABLET | Freq: Two times a day (BID) | ORAL | Status: DC
  Administered 2021-11-01 – 2021-11-04 (×7): 600 mg via ORAL
  Filled 2021-11-01 (×7): qty 1

## 2021-11-01 MED ORDER — SODIUM CHLORIDE 0.9 % IV SOLN
2.0000 g | Freq: Once | INTRAVENOUS | Status: AC
  Administered 2021-11-01: 2 g via INTRAVENOUS
  Filled 2021-11-01: qty 12.5

## 2021-11-01 MED ORDER — ACETAMINOPHEN 325 MG PO TABS
650.0000 mg | ORAL_TABLET | Freq: Four times a day (QID) | ORAL | Status: DC | PRN
  Administered 2021-11-02 (×2): 650 mg via ORAL
  Filled 2021-11-01 (×2): qty 2

## 2021-11-01 MED ORDER — MONTELUKAST SODIUM 10 MG PO TABS
10.0000 mg | ORAL_TABLET | Freq: Every day | ORAL | Status: DC
Start: 2021-11-01 — End: 2021-11-04
  Administered 2021-11-01 – 2021-11-03 (×3): 10 mg via ORAL
  Filled 2021-11-01 (×3): qty 1

## 2021-11-01 MED ORDER — ACETAMINOPHEN 650 MG RE SUPP: 650.0000 mg | Freq: Four times a day (QID) | RECTAL | Status: DC | PRN

## 2021-11-01 MED ORDER — DONEPEZIL HCL 10 MG PO TABS
10.0000 mg | ORAL_TABLET | Freq: Every day | ORAL | Status: DC
  Administered 2021-11-01 – 2021-11-03 (×3): 10 mg via ORAL
  Filled 2021-11-01 (×3): qty 1

## 2021-11-01 MED ORDER — LORATADINE 10 MG PO TABS
10.0000 mg | ORAL_TABLET | Freq: Every day | ORAL | Status: DC
  Administered 2021-11-01 – 2021-11-04 (×4): 10 mg via ORAL
  Filled 2021-11-01 (×4): qty 1

## 2021-11-01 MED ORDER — PANTOPRAZOLE SODIUM 40 MG PO TBEC
40.0000 mg | DELAYED_RELEASE_TABLET | Freq: Two times a day (BID) | ORAL | Status: DC
  Administered 2021-11-01 – 2021-11-04 (×7): 40 mg via ORAL
  Filled 2021-11-01 (×7): qty 1

## 2021-11-01 NOTE — H&P (Signed)
History and Physical    Patient: Jenny Kelly SNK:[id number] DOB: Aug 06, 1945 DOA: 11/01/2021 DOS: the patient was seen and examined on 11/01/2021 PCP: Crist Fat, MD  Patient coming from: Home  via EMS  Chief Complaint:  Chief Complaint  Patient presents with   Urinary Symptoms   HPI: CHE BELOW is a 76 y.o. 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  History is obtained mostly from the patient's husband as the patient is not sure why she is here.  He reports that she was in her normal self and slept most of the day which was unusual.  She did not want to eat anything yesterday evening and he noticed that she had urinated on herself in the chair.  She had felt warm to him and he had checked her temperature, but noted that it was within normal limits.  Earlier that day she complained of pain in her left great toe which was red and swollen.  He tried soaking it in Epsom salt and put triple antibiotic ointment as he thought it was possibly secondary to it ingrown toenail without relief.  She does not have a history of gout.  The patient has had a productive cough, but it has been clear.  Unsure of when her last bowel movement was as he does not always go with her to the restroom.  At home her husband notes that he does check her oxygen levels, and is normally fine even on room air for which she does not wear oxygen all the time.  Upon further questioning he does make note that the patient well intermittently seems to get choked up with drinking liquids.  During her prior hospitalization he thinks in January she had been evaluated by speech and placed on a modified diet at that time, but it was not  continued thereafter.  Records note she was admitted into the hospital in January after having acute stroke with concern for sepsis secondary to pyelonephritis with left renal calculus requiring stent placement by urology.  She was admitted back to the hospital in March due to sepsis secondary to pyelonephritis, and then last admitted 7 the hospital in April for cystoscopically with laser and exchange of stent with urology.  Due to her seeming to be unwell and prior history of urinary tract infections he called EMS.  With EMS patient was 60% on room air and was placed on 2 L of nasal cannula oxygen.    On admission into the emergency department patient was seen to be afebrile, tachycardic, and tachypneic meeting SIRS criteria.  O2 saturations maintained on 2 L of nasal cannula oxygen.  Labs significant for WBC 22.2 with left shift and lactic acid 2.1->1.7.  X-rays of the left great toe showed no acute abnormality and chest noted chronic changes.  CT scan of the abdomen pelvis: Noting right greater than left lobe bronchopneumonia superimposed on acute ectasis, persistent left nephrolithiasis without obstructive uropathy with left stent removed, and retained stool in the distal large bowel concerning for fecal impaction.  COVID-19 and influenza screening was negative. Blood and urine cultures have been obtained.    Patient has been given 2 L of lactated Ringer's,  vancomycin, metronidazole, and cefepime.  Review of Systems: As mentioned in  the history of present illness. All other systems reviewed and are negative.  Note limited due to the patient having dementia. Past Medical History:  Diagnosis Date   Anemia    Anxiety    Arthritis    Asthma    Atrial fibrillation ([place])    Blood transfusion without reported diagnosis    Cataract    CHF (congestive heart failure) ([place])    Chronic kidney disease    Clotting disorder (HCC)    COPD (chronic obstructive pulmonary disease) (HCC)    Dementia (HCC)     Depression    Diabetes mellitus without complication (HCC)    Dyspnea    GERD (gastroesophageal reflux disease)    Headache    Heart murmur    History of kidney stones    Hyperlipidemia    Hypothyroidism    Myocardial infarction ([place])    Neuromuscular disorder ([place])    tremors   Pneumonia due to COVID-19 virus    Seizures (HCC)    Sleep apnea    Stroke ([place])    Thyroid disease    Past Surgical History:  Procedure Laterality Date   ABDOMINAL HYSTERECTOMY     APPENDECTOMY     bladder tack     CHOLECYSTECTOMY     CORONARY ARTERY BYPASS GRAFT     CYSTOSCOPY W/ URETERAL STENT PLACEMENT Left 05/01/2021   Procedure: CYSTOSCOPY WITH RETROGRADE PYELOGRAM/URETERAL STENT PLACEMENT;  Surgeon: Ceasar Mons, MD;  Location: [facility];  Service: Urology;  Laterality: Left;   CYSTOSCOPY/URETEROSCOPY/HOLMIUM LASER/STENT PLACEMENT Left 08/09/2021   Procedure: CYSTOSCOPY/URETEROSCOPY/HOLMIUM LASER/STENT EXCHANGE;  Surgeon: Ceasar Mons, MD;  Location: [facility];  Service: Urology;  Laterality: Left;   ESOPHAGOGASTRODUODENOSCOPY ENDOSCOPY     EYE SURGERY     FRACTURE SURGERY Right    3rd finger   JOINT REPLACEMENT Bilateral    knees   TONSILLECTOMY AND ADENOIDECTOMY     TUBAL LIGATION     Social History:  reports that she quit smoking about 21 years ago. Her smoking use included cigarettes. She has a 15.00 pack-year smoking history. She has never used smokeless tobacco. She reports that she does not currently use alcohol. She reports that she does not use drugs.  Allergies  Allergen Reactions   Ciprofloxacin Hives    Ask patient   Prochlorperazine Edisylate Other (See Comments)    Cant swallow Muscle cramping   Morphine Other (See Comments)    Confusion   Prednisone Other (See Comments)    unknown   Reduced Iso-Alpha Acids Complex Other (See Comments)    unknown   Statins Other (See Comments)    Muscle weakness    Sulfa Antibiotics Swelling   Azithromycin Rash    Latex Rash    Family History  Problem Relation Age of Onset   Heart disease Mother    Heart attack Mother    Breast cancer Mother    Heart disease Sister     Prior to Admission medications   Medication Sig Start Date End Date Taking? Authorizing Provider  acetaminophen (TYLENOL) 500 MG tablet Take 500 mg by mouth every 6 (six) hours as needed for mild pain or moderate pain.    [provider]  aspirin 81 MG EC tablet Take 1 tablet (81 mg total) by mouth daily. Swallow whole. 05/16/21   Love, Ivan Anchors, PA-C  budesonide (PULMICORT) 0.5 MG/2ML nebulizer solution Take 2 mLs (0.5 mg total) by nebulization in the morning and at bedtime. 05/16/21   Reesa Chew  S, PA-C  cetirizine (ZYRTEC) 10 MG tablet Take 10 mg by mouth daily.    [provider]  donepezil (ARICEPT) 10 MG tablet Take 1 tablet (10 mg total) by mouth at bedtime. 05/16/21   Love, Ivan Anchors, PA-C  HYDROcodone-acetaminophen (NORCO/VICODIN) 5-325 MG tablet Take 1 tablet by mouth every 6 (six) hours as needed for moderate pain. 06/30/21   [provider]  levothyroxine (SYNTHROID) 88 MCG tablet Take 88 mcg by mouth daily before breakfast.    [provider]  montelukast (SINGULAIR) 10 MG tablet Take 1 tablet (10 mg total) by mouth at bedtime. 05/16/21   Love, Ivan Anchors, PA-C  Multiple Vitamin (MULTIVITAMIN WITH MINERALS) TABS tablet Take 1 tablet by mouth daily. 05/17/21   Love, Ivan Anchors, PA-C  omeprazole (PRILOSEC) 40 MG capsule Take 1 capsule (40 mg total) by mouth daily. Patient taking differently: Take 40 mg by mouth 2 (two) times daily. 05/16/21   Love, Ivan Anchors, PA-C  oxybutynin (DITROPAN) 5 MG tablet Take 0.5 tablets (2.5 mg total) by mouth 3 (three) times daily. 07/07/21   Cherene Altes, MD  PERCOCET 5-325 MG tablet Take 1 tablet by mouth every 4 (four) hours as needed for severe pain. 07/27/21   [provider]  pravastatin (PRAVACHOL) 80 MG tablet Take 1 tablet (80 mg total) by mouth  every evening. 09/15/21 12/14/21  Park Liter, MD  pregabalin (LYRICA) 50 MG capsule TAKE 1 CAPSULE BY MOUTH 2 TIMES DAILY. 06/30/21   Raulkar, Clide Deutscher, MD  Probiotic Product (PROBIOTIC PO) Take 15 Billion Cells by mouth daily.    [provider]  Rivaroxaban (XARELTO) 15 MG TABS tablet Take 1 tablet (15 mg total) by mouth daily. 09/15/21   Park Liter, MD  topiramate (TOPAMAX) 200 MG tablet Take 1 tablet (200 mg total) by mouth 2 (two) times daily. 05/16/21   Bary Leriche, PA-C    Physical Exam: Vitals:   11/01/21 0530 11/01/21 0600 11/01/21 0630 11/01/21 0700  BP: (!) 124/44 (!) 112/58 114/66 112/68  Pulse: 85 80 77 76  Resp: 19 17 18 18   Temp:      TempSrc:      SpO2: 100% 99% 100% 100%  Weight:      Height:        Constitutional: Elderly female who appears chronically ill in no acute distress Eyes: PERRL, lids and conjunctivae normal ENMT: Mucous membranes are moist.  Poor dentition. Neck: normal, supple, no JVD appreciated Respiratory: Normal respiratory effort with positive inspiratory crackles appreciated more so in the lower lung fields.  No significant wheezes appreciated.  O2 saturations currently maintained on 2 L nasal cannula oxygen. Cardiovascular: Regular rate and rhythm, no murmurs / rubs / gallops. No extremity edema. Abdomen: no tenderness.  Bowel sounds positive.  Musculoskeletal: no clubbing / cyanosis.  Redness and swelling of the left great toe, but were reported to be improved since antibiotics have been started.  Skin: no rashes, lesions, ulcers. No induration Neurologic: CN 2-12 grossly intact.  Left hemiparesis. Psychiatric:  Alert and oriented x person.  Normal mood  Data Reviewed:  EKG reveals sinus rhythm at 80 bpm with QTc 412.  Reviewed labs and imaging and pertinent records as noted above in HPI  Assessment and Plan: Sepsis secondary to pneumonia Patient presents after being noted to be more lethargic with episode of  urinary incontinence.  Found to be tachycardic and tachypnea with leukocytosis with left shift and initial lactic acid 2.1.  CT imaging noted concern for with infiltrates worse on the right and left superimposed on bronchiectasis.  Urinalysis did not noting concern for infection  Blood and urine cultures were sent.  Patient admitted started initially on vancomycin, cefepime, and metronidazole. -Admit to a telemetry bed -Continuous pulse oximetry with oxygen to maintain O2 saturations greater than 92% -Aspiration precautions with elevation of head of bed  -Incentive spirometry and flutter valve every 4 hours -Follow-up blood and urine cultures -Check sputum culture, urine Legionella, urine strep -Check procalcitonin -Changed antibiotics to Rocephin and doxycycline(has some coverage against microbacterium -Mucinex -Recheck CBC tomorrow morning  Acute metabolic encephalopathy dementia Patient has history of dementia with prior concerns for sundowning during previous admissions.  Documented to have been treated with Seroquel, but noted that that delayed discharged -Delirium precautions -Continue donepezil  Left toe pain Acute.  Patient was noted to have redness and swelling of the left great toe recently.  X-rays of the left toe noted degenerative changes without any acute abnormality.  No known history of gout. -Add-on uric acid level -Antibiotics as noted above -Consider adding on colchicine in the acute setting if uric acid levels noted to be elevated  Possible fecal impaction/constipation CT scan noted retained stool in the distal large bowel concerning for fecal impaction. -Monitor intake and output -Senokot-S nightly.  Continue to monitor and adjust regimen when medically appropriate -smog enema  History of CVA with residual deficit dysphagia Patient had right middle lobe CVA in January 2023 resulting in left upper extremity hemiparesis. -Continue aspirin -Speech therapy to  evaluate due to suspected continue aspiration  COPD without acute exacerbation chronic respiratory failure with hypoxia pulmonary fibrosis In route with EMS patient's O2 saturations were noted to be around 60% on room air with improvement on home 2 L.  Husband stated that patient did not always wear the oxygen at home due to O2 saturations appear within normal limits on room air.  On physical exam patient with inspiratory crackles most notably in the lower lobes.  He reports that she has pulmonary fibrosis secondary to previously having COVID. -Incentive spirometry -Continue budesonide nebs twice daily -Continue Singulair and cetirizine  Paroxysmal atrial fibrillation on chronic anticoagulation Patient appears rate controlled at this time. -Continue Xarelto  Heart failure with preserved EF Last EF noted to be 60-65% with grade 2 diastolic dysfunction back in 04/2021. Patient does not look over loaded. -Strict I&Os and daily weights  Chronic kidney disease stage IIIb Creatinine 1.47 which appears similar to previous from last hospitalization prior to discharge. -Continue to monitor kidney function  Seizure disorder -Continue Topamax  Hyperlipidemia Patient does not appear to be on pravastatin due to muscle weakness, and is on omega-3 fatty acids twice daily. -Resume omega 3 fatty acids as outpatient  Hypothyroidism Previous TSH 0.784 in 04/2021.  Home medication regimen includes -Check TSH -Continue levothyroxine and adjust dose if needed  GERD Home medications include omeprazole 40 mg twice daily. -Pharmacy substitution of Protonix twice daily  DVT prophylaxis: Xarelto Advance Care Planning:   Code Status: DNR    Consults: None  Family Communication: Husband updated at bedside  Severity of Illness: The appropriate patient status for this patient is INPATIENT. Inpatient status is judged to be reasonable and necessary in order to provide the required intensity of service to  ensure the patient's safety. The patient's presenting symptoms, physical exam findings, and initial radiographic and laboratory data in the context of their chronic comorbidities is felt to place them at high  risk for further clinical deterioration. Furthermore, it is not anticipated that the patient will be medically stable for discharge from the hospital within 2 midnights of admission.   * I certify that at the point of admission it is my clinical judgment that the patient will require inpatient hospital care spanning beyond 2 midnights from the point of admission due to high intensity of service, high risk for further deterioration and high frequency of surveillance required.*  Author: Clydie Braun, MD 11/01/2021 7:50 AM  For on call review [email or web address].

## 2021-11-01 NOTE — ED Triage Notes (Signed)
Patient here from home with urinary problems.  Patient has had foul odor to her urine.  Patient has dementia and husband was bringing her to ED due to this.  Patient wears oxygen at 2L, sometimes does not wear it.  She was found at 60% RA at home.  No shortness of breath at this time.  She has a runny nose and left toe hurts.

## 2021-11-01 NOTE — Sepsis Progress Note (Signed)
Notified bedside nurse of need to draw repeat lactic acid. 

## 2021-11-01 NOTE — Sepsis Progress Note (Signed)
Monitoring for the code sepsis protocol. °

## 2021-11-01 NOTE — ED Notes (Signed)
Pt destated in triage. Pt was put on 2 L of O2.

## 2021-11-01 NOTE — Evaluation (Addendum)
Clinical/Bedside Swallow Evaluation Patient Details  Name: Jenny Kelly MRN: [id number] Date of Birth: November 24, 1945  [name]: 11/01/2021 Time: SLP Start Time (ACUTE ONLY): 1225 SLP Stop Time (ACUTE ONLY): 1255 SLP Time Calculation (min) (ACUTE ONLY): 30 min  Past Medical History:  Past Medical History:  Diagnosis Date   Anemia    Anxiety    Arthritis    Asthma    Atrial fibrillation (HCC)    Blood transfusion without reported diagnosis    Cataract    CHF (congestive heart failure) (HCC)    Chronic kidney disease    Clotting disorder (HCC)    COPD (chronic obstructive pulmonary disease) (HCC)    Dementia (HCC)    Depression    Diabetes mellitus without complication (HCC)    Dyspnea    GERD (gastroesophageal reflux disease)    Headache    Heart murmur    History of kidney stones    Hyperlipidemia    Hypothyroidism    Myocardial infarction (HCC)    Neuromuscular disorder (HCC)    tremors   Pneumonia due to COVID-19 virus    Seizures (HCC)    Sleep apnea    Stroke (HCC)    Thyroid disease    Past Surgical History:  Past Surgical History:  Procedure Laterality Date   ABDOMINAL HYSTERECTOMY     APPENDECTOMY     bladder tack     CHOLECYSTECTOMY     CORONARY ARTERY BYPASS GRAFT     CYSTOSCOPY W/ URETERAL STENT PLACEMENT Left 05/01/2021   Procedure: CYSTOSCOPY WITH RETROGRADE PYELOGRAM/URETERAL STENT PLACEMENT;  Surgeon: Rene Paci, MD;  Location: [facility];  Service: Urology;  Laterality: Left;   CYSTOSCOPY/URETEROSCOPY/HOLMIUM LASER/STENT PLACEMENT Left 08/09/2021   Procedure: CYSTOSCOPY/URETEROSCOPY/HOLMIUM LASER/STENT EXCHANGE;  Surgeon: Rene Paci, MD;  Location: [facility];  Service: Urology;  Laterality: Left;   ESOPHAGOGASTRODUODENOSCOPY ENDOSCOPY     EYE SURGERY     FRACTURE SURGERY Right    3rd finger   JOINT REPLACEMENT Bilateral    knees   TONSILLECTOMY AND ADENOIDECTOMY     TUBAL LIGATION     HPI:  76 y.o. female.  With past  medical history of diabetes, COPD, chronic kidney disease, congestive heart failure most recent EF 60 to 64%, atrial fibrillation anticoagulated on Xarelto, recent stroke in March who presents to the emergency department with urinary symptoms.    Level 5 caveat, minor dementia.  Husband, Jenny Kelly, provides most of the interview.  He states that patient has been doing well until this evening when she did not want to eat dinner.  He states that after not eating dinner the patient became less responsive and he could not wake her up or get her out of her chair.  He states that she was also incontinent in her chair.  States she had foul urine.  Additionally over the past 1 to 2 days the patient has been complaining about her left great toe hurting and that it has been red and swollen.  He denies any recent new productive cough or fevers, any complaints of abdominal pain, nausea, vomiting or diarrhea.  He states that she has a history of pulmonary fibrosis and does not wear oxygen consistently at home.  Per EMS she was found to have O2 sat of 60% on room air; CT abdomen on 11/01/21 indicated Right greater than left lower lobe Bronchopneumonia/Pneumonia,  superimposed on some chronic Bronchiectasis; BSE ordered.    Assessment / Plan / Recommendation  Clinical Impression  Pt  seen for clinical swallow evaluation with mild oral phase dysphagia noted d/t impaired mastication/slight L weakness with prolonged oral phase/decreased oral manipulation with solid consistencies observed.  Pt given min verbal cues for smaller bites/sips during assessment.  OME normal with slight L weakness/ labial asymmetry at rest.  Missing upper dentition and is not able to wear dentures.  Pt demonstrated a strong congested cough, but pt/husband stated this "is normal since Covid."  Pt placed on Dysphagia 2/thin liquid diet during hospitalization in January for CVA.  Pt stated globus sensation intermittently during meals, but she uses repetitive  swallows or a liquid wash to assist with this occurrence.  Discussed respiratory/swallowing reciprocity and implications related to swallowing with pt/husband.  Recommend continuing Dysphagia 2/thin liquid diet with esophageal/respiratory precautions and general swallowing precautions followed during meals.  Swallowing precaution sign placed in room and discussed with pt/family.  ST will f/u for safety/diet tolerance during acute stay.   SLP Visit Diagnosis: Dysphagia, oropharyngeal phase (R13.12)    Aspiration Risk  Mild aspiration risk    Diet Recommendation   Dysphagia 2/thin liquids  Medication Administration: Whole meds with puree    Other  Recommendations Oral Care Recommendations: Oral care BID;Patient independent with oral care    Recommendations for follow up therapy are one component of a multi-disciplinary discharge planning process, led by the attending physician.  Recommendations may be updated based on patient status, additional functional criteria and insurance authorization.  Follow up Recommendations Follow physician's recommendations for discharge plan and follow up therapies      Assistance Recommended at Discharge Intermittent Supervision/Assistance  Functional Status Assessment Patient has had a recent decline in their functional status and demonstrates the ability to make significant improvements in function in a reasonable and predictable amount of time.  Frequency and Duration min 2x/week  1 week       Prognosis Prognosis for Safe Diet Advancement: Good      Swallow Study   General Date of Onset: 11/01/21 HPI: 76 y.o. female.  With past medical history of diabetes, COPD, chronic kidney disease, congestive heart failure most recent EF 60 to 64%, atrial fibrillation anticoagulated on Xarelto, recent stroke in March who presents to the emergency department with urinary symptoms.    Level 5 caveat, minor dementia.  Husband, Jenny Kelly, provides most of the interview.  He  states that patient has been doing well until this evening when she did not want to eat dinner.  He states that after not eating dinner the patient became less responsive and he could not wake her up or get her out of her chair.  He states that she was also incontinent in her chair.  States she had foul urine.  Additionally over the past 1 to 2 days the patient has been complaining about her left great toe hurting and that it has been red and swollen.  He denies any recent new productive cough or fevers, any complaints of abdominal pain, nausea, vomiting or diarrhea.  He states that she has a history of pulmonary fibrosis and does not wear oxygen consistently at home.  Per EMS she was found to have O2 sat of 60% on room air; CT abdomen on 11/01/21 indicated Right greater than left lower lobe Bronchopneumonia/Pneumonia,  superimposed on some chronic Bronchiectasis; BSE ordered. Type of Study: Bedside Swallow Evaluation Diet Prior to this Study: Dysphagia 2 (chopped);Thin liquids Temperature Spikes Noted: No Respiratory Status: Nasal cannula History of Recent Intubation: No Behavior/Cognition: Alert;Cooperative Oral Cavity Assessment: Within Functional  Limits Oral Care Completed by SLP: Recent completion by staff Oral Cavity - Dentition: Missing dentition Vision: Functional for self-feeding Self-Feeding Abilities: Able to feed self;Needs assist Patient Positioning: Upright in bed Baseline Vocal Quality: Hoarse Volitional Cough: Strong;Congested Volitional Swallow: Able to elicit    Oral/Motor/Sensory Function Overall Oral Motor/Sensory Function: Mild impairment (L slight asymmetry at rest, but [facility] for speech/swl) Facial Symmetry: Abnormal symmetry left (slight)   Ice Chips Ice chips: Within functional limits Presentation: Spoon   Thin Liquid Thin Liquid: Impaired Presentation: Cup;Straw Oral Phase Impairments: Impaired mastication Oral Phase Functional Implications: Prolonged oral  transit Pharyngeal  Phase Impairments: Suspected delayed Swallow;Cough - Delayed (with larger amounts delayed cough noted)    Nectar Thick Nectar Thick Liquid: Not tested   Honey Thick Honey Thick Liquid: Not tested   Puree Puree: Within functional limits Presentation: Spoon   Solid     Solid: Impaired Presentation: Self Fed Oral Phase Impairments: Reduced lingual movement/coordination;Impaired mastication Oral Phase Functional Implications: Prolonged oral transit;Impaired mastication      Tressie Stalker, M.S., CCC-SLP 11/01/2021,12:58 PM

## 2021-11-01 NOTE — ED Provider Triage Note (Signed)
Emergency Medicine Provider Triage Evaluation Note  DOT SPLINTER , a 76 y.o. female  was evaluated in triage.  Pt complains of UTI symptoms.  Husband reports urine had foul odor, too weak to get her dressed to bring to the ED himself so EMS called.  Hx of UTI's in the past, finished abx 1 month ago for same.  Patient has hx of dementia, denies pain.  Uses 2L PRN O2, sats only got up to 90% with 4L with EMS.  Has had recent cough.  Review of Systems  Positive: UTI symptoms, cough Negative: fever  Physical Exam  BP 133/82 (BP Location: Right Arm)   Pulse (!) 128   Temp 98.2 F (36.8 C) (Oral)   Resp 16   SpO2 95%   Gen:   Awake, appears unwell Resp:  Normal effort, rhonchi noted, 2L O2 in use, sats dropping into the 80's, increased to 3L with improvement MSK:   Moves extremities without difficulty  Other:    Medical Decision Making  Medically screening exam initiated at 1:28 AM.  Appropriate orders placed.  ZIMAL WEISENSEL was informed that the remainder of the evaluation will be completed by another provider, this initial triage assessment does not replace that evaluation, and the importance of remaining in the ED until their evaluation is complete.  UTI symptoms, cough, generalized weakness.  Patient appears unwell, sats in the 80's on her baseline 2L.  She is tachycardic in triage.  Will initiate sepsis protocol-- labs, cultures, CXR, UA w/culture.     Garlon Hatchet, PA-C 11/01/21 0136

## 2021-11-01 NOTE — ED Notes (Signed)
Jenny Kelly is more alert after eating her dinner. Per her husband she ate 75% of her meal.

## 2021-11-01 NOTE — ED Provider Notes (Signed)
[facility] EMERGENCY DEPARTMENT Provider Note   CSN: [id number] Arrival date & time: 11/01/21  0123     History  Chief Complaint  Patient presents with   Urinary Symptoms    Jenny Kelly is a 76 y.o. female.  With past medical history of diabetes, COPD, chronic kidney disease, congestive heart failure most recent EF 60 to 64%, atrial fibrillation anticoagulated on Xarelto, recent stroke in March who presents to the emergency department with urinary symptoms.  Level 5 caveat, minor dementia.  Husband, Fayrene Fearing, provides most of the interview.  He states that patient has been doing well until this evening when she did not want to eat dinner.  He states that after not eating dinner the patient became less responsive and he could not wake her up or get her out of her chair.  He states that she was also incontinent in her chair.  States she had foul urine.  Additionally over the past 1 to 2 days the patient has been complaining about her left great toe hurting and that it has been red and swollen.  He denies any recent new productive cough or fevers, any complaints of abdominal pain, nausea, vomiting or diarrhea.  He states that she has a history of pulmonary fibrosis and does not wear oxygen consistently at home.  Per EMS she was found to have O2 sat of 60% on room air.  The patient does provide brief history and states that she does have chronic cough but it is nonproductive.  She does not feel short of breath or have chest pain.  She denies any abdominal pain or nausea.  She denies dysuria but does complain about her left great toe.  Notably, the patient was admitted from 07/01/21 - 07/07/2021 for acute stroke and urosepsis.  She had an infected, obstructing stone that was stented by urology.  She was initiated on IV antibiotics and transition to Keflex.  She had a cystoscopy and left ureteroscopy with lithotripsy.  They exchanged her left ureteral stent and was discharged  home.  HPI     Home Medications Prior to Admission medications   Medication Sig Start Date End Date Taking? Authorizing Provider  acetaminophen (TYLENOL) 500 MG tablet Take 500 mg by mouth every 6 (six) hours as needed for mild pain or moderate pain.    [provider]  aspirin 81 MG EC tablet Take 1 tablet (81 mg total) by mouth daily. Swallow whole. 05/16/21   Love, Evlyn Kanner, PA-C  budesonide (PULMICORT) 0.5 MG/2ML nebulizer solution Take 2 mLs (0.5 mg total) by nebulization in the morning and at bedtime. 05/16/21   Love, Evlyn Kanner, PA-C  cetirizine (ZYRTEC) 10 MG tablet Take 10 mg by mouth daily.    [provider]  donepezil (ARICEPT) 10 MG tablet Take 1 tablet (10 mg total) by mouth at bedtime. 05/16/21   Love, Evlyn Kanner, PA-C  HYDROcodone-acetaminophen (NORCO/VICODIN) 5-325 MG tablet Take 1 tablet by mouth every 6 (six) hours as needed for moderate pain. 06/30/21   [provider]  levothyroxine (SYNTHROID) 88 MCG tablet Take 88 mcg by mouth daily before breakfast.    [provider]  montelukast (SINGULAIR) 10 MG tablet Take 1 tablet (10 mg total) by mouth at bedtime. 05/16/21   Love, Evlyn Kanner, PA-C  Multiple Vitamin (MULTIVITAMIN WITH MINERALS) TABS tablet Take 1 tablet by mouth daily. 05/17/21   Love, Evlyn Kanner, PA-C  omeprazole (PRILOSEC) 40 MG capsule Take 1 capsule (40 mg  total) by mouth daily. Patient taking differently: Take 40 mg by mouth 2 (two) times daily. 05/16/21   Love, Evlyn Kanner, PA-C  oxybutynin (DITROPAN) 5 MG tablet Take 0.5 tablets (2.5 mg total) by mouth 3 (three) times daily. 07/07/21   Lonia Blood, MD  PERCOCET 5-325 MG tablet Take 1 tablet by mouth every 4 (four) hours as needed for severe pain. 07/27/21   [provider]  pravastatin (PRAVACHOL) 80 MG tablet Take 1 tablet (80 mg total) by mouth every evening. 09/15/21 12/14/21  Georgeanna Lea, MD  pregabalin (LYRICA) 50 MG capsule TAKE 1 CAPSULE BY MOUTH 2 TIMES DAILY.  06/30/21   Raulkar, Drema Pry, MD  Probiotic Product (PROBIOTIC PO) Take 15 Billion Cells by mouth daily.    [provider]  Rivaroxaban (XARELTO) 15 MG TABS tablet Take 1 tablet (15 mg total) by mouth daily. 09/15/21   Georgeanna Lea, MD  topiramate (TOPAMAX) 200 MG tablet Take 1 tablet (200 mg total) by mouth 2 (two) times daily. 05/16/21   Love, Evlyn Kanner, PA-C      Allergies    Ciprofloxacin, Prochlorperazine edisylate, Morphine, Prednisone, Reduced iso-alpha acids complex, Statins, Sulfa antibiotics, Azithromycin, and Latex    Review of Systems   Review of Systems  Constitutional:  Positive for appetite change and fatigue.  Musculoskeletal:  Positive for arthralgias.  All other systems reviewed and are negative.   Physical Exam Updated Vital Signs BP 133/82 (BP Location: Right Arm)   Pulse (!) 128   Temp 98.2 F (36.8 C) (Oral)   Resp 16   SpO2 95%  Physical Exam Vitals and nursing note reviewed.  Constitutional:      General: She is in acute distress.     Appearance: Normal appearance. She is ill-appearing.  HENT:     Head: Normocephalic.     Mouth/Throat:     Mouth: Mucous membranes are moist.     Pharynx: Oropharynx is clear.  Eyes:     General: No scleral icterus.    Extraocular Movements: Extraocular movements intact.     Conjunctiva/sclera: Conjunctivae normal.  Cardiovascular:     Rate and Rhythm: Regular rhythm. Tachycardia present.     Pulses: Normal pulses.     Heart sounds: No murmur heard. Pulmonary:     Effort: Pulmonary effort is normal. No accessory muscle usage.     Breath sounds: Rhonchi and rales present.  Abdominal:     General: Abdomen is protuberant. Bowel sounds are normal. There is no distension.     Palpations: Abdomen is soft.     Tenderness: There is no abdominal tenderness.  Musculoskeletal:        General: Swelling and tenderness present.     Cervical back: Neck supple.       Feet:  Feet:     Left foot:     Skin  integrity: Erythema present.     Comments: Erythema and mild swelling noted to the left great toe.  Tenderness to palpation Skin:    General: Skin is warm and dry.     Capillary Refill: Capillary refill takes less than 2 seconds.  Neurological:     General: No focal deficit present.     Mental Status: She is alert. She is disoriented.  Psychiatric:        Mood and Affect: Mood normal.        Behavior: Behavior normal.     ED Results / Procedures / Treatments   Labs (all  labs ordered are listed, but only abnormal results are displayed) Labs Reviewed  CBC WITH DIFFERENTIAL/PLATELET - Abnormal; Notable for the following components:      Result Value   WBC 22.2 (*)    RDW 17.3 (*)    Neutro Abs 18.0 (*)    Monocytes Absolute 1.5 (*)    Abs Immature Granulocytes 0.15 (*)    All other components within normal limits  COMPREHENSIVE METABOLIC PANEL - Abnormal; Notable for the following components:   CO2 19 (*)    Glucose, Bld 153 (*)    Creatinine, Ser 1.47 (*)    Albumin 3.0 (*)    GFR, Estimated 37 (*)    All other components within normal limits  LACTIC ACID, PLASMA - Abnormal; Notable for the following components:   Lactic Acid, Venous 2.1 (*)    All other components within normal limits  PROTIME-INR - Abnormal; Notable for the following components:   Prothrombin Time 16.8 (*)    INR 1.4 (*)    All other components within normal limits  CULTURE, BLOOD (ROUTINE X 2)  CULTURE, BLOOD (ROUTINE X 2)  URINE CULTURE  RESP PANEL BY RT-PCR (FLU A&B, COVID) ARPGX2  URINALYSIS, ROUTINE W REFLEX MICROSCOPIC  APTT  LACTIC ACID, PLASMA   EKG None  Radiology CT ABDOMEN PELVIS WO CONTRAST  Result Date: 11/01/2021 CLINICAL DATA:  76 year old female with flank pain.  UTI. EXAM: CT ABDOMEN AND PELVIS WITHOUT CONTRAST TECHNIQUE: Multidetector CT imaging of the abdomen and pelvis was performed following the standard protocol without IV contrast. RADIATION DOSE REDUCTION: This exam was  performed according to the departmental dose-optimization program which includes automated exposure control, adjustment of the mA and/or kV according to patient size and/or use of iterative reconstruction technique. COMPARISON:  CT Abdomen and Pelvis 07/01/2021 and earlier. FINDINGS: Lower chest: Right lower lobe bronchiectasis and bronchial wall thickening previously demonstrated, now there is confluent right lower lobe peribronchial opacity (series 5, image 1). Contralateral left lung base opacity has also mildly increased. No pericardial or pleural effusion. Prior sternotomy. Hepatobiliary: Surgically absent gallbladder. Negative noncontrast liver. Pancreas: Pancreatic atrophy and dystrophic calcifications of chronic calcific pancreatitis. Spleen: Calcified splenic granulomas are unchanged. Adrenals/Urinary Tract: Negative adrenal glands. Left double-J ureteral stent has been removed since March. Persistent left nephrolithiasis, mostly punctate. But no hydronephrosis or hydroureter today. No convincing right nephrolithiasis. There are right renal vascular calcifications. Bladder is decompressed and unremarkable. Stomach/Bowel: Increased retained stool in the distal large bowel, especially the rectum (series 3, image 75). Upstream nondilated descending colon. Transverse colon mostly decompressed and contains gas. Decompressed right colon. Diminutive or absent appendix. No dilated small bowel. Decompressed stomach and duodenum. No free air or free fluid. Vascular/Lymphatic: Extensive Aortoiliac calcified atherosclerosis. Vascular patency is not evaluated in the absence of IV contrast. No lymphadenopathy identified. Reproductive: Surgically absent uterus.  Diminutive ovaries. Other: No pelvic free fluid. Musculoskeletal: Chronic sternotomy. Osteopenia. Lower lumbar spine degeneration. No acute osseous abnormality identified. IMPRESSION: 1. Right greater than left lower lobe Bronchopneumonia/Pneumonia, superimposed  on some chronic Bronchiectasis. No pleural effusion. 2. No acute or inflammatory process identified in the noncontrast abdomen or pelvis. Left double-J ureteral stent removed since March with persistent left nephrolithiasis but no obstructive uropathy. Increased retained stool in the distal large bowel, consider fecal impaction. Advanced Aortic Atherosclerosis (ICD10-I70.0). Electronically Signed   By: Genevie Ann M.D.   On: 11/01/2021 04:08   DG Toe Great Left  Result Date: 11/01/2021 CLINICAL DATA:  osteo r/o EXAM: LEFT  GREAT TOE COMPARISON:  None Available. FINDINGS: Degenerative changes in the 1st MTP joint and IP joint. No acute bony abnormality. Specifically, no fracture, subluxation, or dislocation. No bone destruction to suggest osteomyelitis. IMPRESSION: No acute bony abnormality. Electronically Signed   By: Rolm Baptise M.D.   On: 11/01/2021 03:27   DG Chest 2 View  Result Date: 11/01/2021 CLINICAL DATA:  Shortness of breath, cough EXAM: CHEST - 2 VIEW COMPARISON:  09/13/2021 FINDINGS: Interstitial prominence within the lungs compatible with chronic lung disease, unchanged since prior study. Heart is normal size. Prior CABG. Aortic atherosclerosis. No effusions or acute bony abnormality. IMPRESSION: Chronic changes.  No active disease. Electronically Signed   By: Rolm Baptise M.D.   On: 11/01/2021 02:20    Procedures .Critical Care  Performed by: Mickie Hillier, PA-C Authorized by: Mickie Hillier, PA-C   Critical care provider statement:    Critical care time (minutes):  40   Critical care time was exclusive of:  Separately billable procedures and treating other patients   Critical care was necessary to treat or prevent imminent or life-threatening deterioration of the following conditions:  Sepsis   Critical care was time spent personally by me on the following activities:  Development of treatment plan with patient or surrogate, discussions with primary provider, discussions with  consultants, evaluation of patient's response to treatment, examination of patient, obtaining history from patient or surrogate, review of old charts, re-evaluation of patient's condition, pulse oximetry, ordering and review of radiographic studies, ordering and review of laboratory studies and ordering and performing treatments and interventions   I assumed direction of critical care for this patient from another provider in my specialty: no     Care discussed with: admitting provider      Medications Ordered in ED Medications  lactated ringers infusion ( Intravenous New Bag/Given 11/01/21 0506)  vancomycin (VANCOREADY) IVPB 1250 mg/250 mL (1,250 mg Intravenous New Bag/Given 11/01/21 0507)  lactated ringers bolus 1,000 mL (0 mLs Intravenous Stopped 11/01/21 0441)    And  lactated ringers bolus 1,000 mL (0 mLs Intravenous Stopped 11/01/21 0441)  ceFEPIme (MAXIPIME) 2 g in sodium chloride 0.9 % 100 mL IVPB (0 g Intravenous Stopped 11/01/21 0504)  metroNIDAZOLE (FLAGYL) IVPB 500 mg (0 mg Intravenous Stopped 11/01/21 0504)    ED Course/ Medical Decision Making/ A&P                           Medical Decision Making Amount and/or Complexity of Data Reviewed Labs: ordered. Radiology: ordered.  Risk Prescription drug management. Decision regarding hospitalization.   This patient presents to the ED for concern of sepsis, this involves an extensive number of treatment options, and is a complaint that carries with it a high risk of complications and morbidity.  The differential diagnosis includes septic shock, pneumonia, urosepsis, intra-abdominal source of infection, etc.  Co morbidities that complicate the patient evaluation Dementia, PAF, HLD, CHF, COPD, pulmonary fibrosis   Additional history obtained:  Additional history obtained from: Husband, [name] at bedside External records from outside source obtained and reviewed including: Discharge summary from March admission, urology op  note  EKG: EKG: normal EKG, normal sinus rhythm.   Cardiac Monitoring: The patient was maintained on a cardiac monitor.  I personally viewed and interpreted the cardiac monitored which showed an underlying rhythm of: Sinus rhythm  Lab Results: I personally ordered, reviewed, and interpreted labs. Pertinent results include: CBC with leukocytosis to 22, left  shift CMP with creatinine 1.47, baseline, no electrolyte derangement, transaminitis Lactic 2.1 UA negative Blood cultures pending COVID and flu pending  Imaging Studies ordered:  I ordered imaging studies which included x-ray and CT.  I independently reviewed & interpreted imaging & am in agreement with radiology impression. Imaging shows: X-ray of the chest unremarkable X-ray of the left great toe negative for osteomyelitis CT abdomen pelvis shows right greater than left lower lobe bronchopneumonia.  Negative abdomen pelvis.  There is nephrolithiasis but punctate and not obstructing  Medications  I ordered medication including cefepime, Flagyl, vancomycin and fluids for sepsis Reevaluation of the patient after medication shows that patient improved -I reviewed the patient's home medications and did not make adjustments. -I did  prescribe new home medications.  Tests Considered: Consider CT chest, deferred at this time given that we have diagnosis of pneumonia.  Critical Interventions: Initiating fluid and IV antibiotics  Consultations: I requested consultation with the hospitalist, Lyda Perone,  and discussed lab and imaging findings as well as pertinent plan - they recommend: Admission  SDH Dementia, husband at bedside  ED Course:  76 year old female who presents emergency department with somnolence, anorexia.  She was also found to have hypoxia with EMS.  Sepsis work-up initiated with broad work-up.  On my physical exam she has coarse lung sounds and is on 2 L nasal cannula.  She is in no respiratory distress.   Abdomen is soft and nontender.  She does have a stage II decubitus ulcer that is not open or infected.  She does have redness to the left great toe that she is complaining about.  Obtain broad labs, chest x-ray, x-ray of the left great toe to rule out osteomyelitis and CT abdomen pelvis to assess the renal system given her recent history of infected obstructed stone.  CBC notable for leukocytosis 22 with a left shift, lactic 2.1 Chest x-ray negative for pneumonia, pleural effusion, pneumothorax.  Obtained x-ray of the great toe which shows no osteomyelitis. CT of the abdomen pelvis shows no evidence of pyelo or obstructive uropathy.  Incidentally it does show a bilateral lower lobe pneumonia. No other pertinent findings.  In and out cath in the emergency department is also negative for UTI. Given the pneumonia diagnosis from CT, obtained COVID and flu testing which is pending.  She is only on 1 L at this time on reassessment.  Blood cultures were drawn prior to initiating broad-spectrum antibiotics which can be narrowed on admission.  She was also given fluid resuscitation.  Will trend lactic.  She is currently afebrile and not in shock. On reevaluation she is mentating and appears comfortable.  We will admit her for IV antibiotics for community-acquired pneumonia.  She will need Pseudomonas coverage given her history of COPD and pulmonary fibrosis.  Consulted and spoke with Augusto Gamble, hospitalist, who agrees to admit the patient.  After consideration of the diagnostic results and the patients response to treatment, I feel that the patent would benefit from admission. The patient has been appropriately medically screened and/or stabilized in the ED.  Final Clinical Impression(s) / ED Diagnoses Final diagnoses:  Community acquired pneumonia of right lower lobe of lung    Rx / DC Orders ED Discharge Orders     None         Cristopher Peru, PA-C 11/01/21 0558    Sabas Sous,  MD 11/01/21 [phone]

## 2021-11-02 DIAGNOSIS — A419 Sepsis, unspecified organism: Secondary | ICD-10-CM | POA: Diagnosis not present

## 2021-11-02 DIAGNOSIS — J189 Pneumonia, unspecified organism: Secondary | ICD-10-CM | POA: Diagnosis not present

## 2021-11-02 LAB — CBC
HCT: 34.1 % — ABNORMAL LOW (ref 36.0–46.0)
Hemoglobin: 10.5 g/dL — ABNORMAL LOW (ref 12.0–15.0)
MCH: 27.9 pg (ref 26.0–34.0)
MCHC: 30.8 g/dL (ref 30.0–36.0)
MCV: 90.7 fL (ref 80.0–100.0)
Platelets: 142 10*3/uL — ABNORMAL LOW (ref 150–400)
RBC: 3.76 MIL/uL — ABNORMAL LOW (ref 3.87–5.11)
RDW: 16.9 % — ABNORMAL HIGH (ref 11.5–15.5)
WBC: 9.8 10*3/uL (ref 4.0–10.5)
nRBC: 0 % (ref 0.0–0.2)

## 2021-11-02 LAB — MAGNESIUM: Magnesium: 1.6 mg/dL — ABNORMAL LOW (ref 1.7–2.4)

## 2021-11-02 LAB — BASIC METABOLIC PANEL
Anion gap: 8 (ref 5–15)
BUN: 15 mg/dL (ref 8–23)
CO2: 19 mmol/L — ABNORMAL LOW (ref 22–32)
Calcium: 8.5 mg/dL — ABNORMAL LOW (ref 8.9–10.3)
Chloride: 115 mmol/L — ABNORMAL HIGH (ref 98–111)
Creatinine, Ser: 1.06 mg/dL — ABNORMAL HIGH (ref 0.44–1.00)
GFR, Estimated: 54 mL/min — ABNORMAL LOW (ref >60)
Glucose, Bld: 100 mg/dL — ABNORMAL HIGH (ref 70–99)
Potassium: 3.5 mmol/L (ref 3.5–5.1)
Sodium: 142 mmol/L (ref 135–145)

## 2021-11-02 LAB — URINE CULTURE: Culture: NO GROWTH

## 2021-11-02 MED ORDER — COLCHICINE 0.6 MG PO TABS
1.2000 mg | ORAL_TABLET | Freq: Once | ORAL | Status: AC
  Administered 2021-11-02: 1.2 mg via ORAL
  Filled 2021-11-02: qty 2

## 2021-11-02 NOTE — Progress Notes (Signed)
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  In the ED had extensive evaluation and felt to be in sepsis with source as pneumonia in R lung, labs showed AKI and leukocytosis, Lactate 2.1.  Admitted  on antibiotics, speech therapy consulted to evaluate for speech question patient aspirating.  Started on a dysphagia diet.  Uric acid level noted to be elevated at 7.7.  Given colchicine 1.2 mg 1 dose Overnight patient has been more alert awake tolerated meals 75%, leukocytosis resolved from 22-9.8, procalcitonin 0.13Lactic acid down to 1.7, creatinine improved to 1.0 from 1.4     Subjective: Seen and examined this morning. Patient is more alert awake oriented, on room air shortness of breath has some cough.   Assessment and Plan: Principal Problem:   Sepsis due to pneumonia [facility]) Active Problems:   Acute metabolic encephalopathy   Great toe pain, left   Fecal impaction (HCC)   History of CVA with residual deficit   Chronic respiratory failure with hypoxia (HCC)   PAF (paroxysmal atrial fibrillation) (HCC)   Hypothyroidism   Hyperlipidemia   Pulmonary fibrosis (HCC)   Seizures (HCC)   Dementia with behavioral disturbance (HCC)   (HFpEF) heart failure with preserved ejection fraction (HCC)   Sepsis due to pneumonia Right greater than left lower lobe bronchopneumonia/pneumonia superimposed on chronic bronchiectasis: Clinically improving, leukocytosis has resolved.  Doing well on room air.  Continue with current Rocephin, doxycycline.  Continue incentive spirometry, PT OT ambulation.  Acute metabolic  encephalopathy/somnolence on admission at this time mentation much improved, with baseline dementia on donepezil.  Great toe pain, left: X-rays shows degenerative changes, no known history of gout, on antibiotics as above.Uric acid slightly up at 7.7.  Possible fecal impaction/constipation: Noted on CT scan due to distill with distal large bowel concerning for fecal impaction monitor intake output continue stool softener Senokot, smog enema prn.  COPD Chronic bronchiectasis Chronic respiratory failure with hypoxia Pulmonary fibrosis: Patient uses oxygen intermittently at home.  At this time no wheezing no exacerbation.  Continue antibiotic for pneumonia continue spirometry budesonide nebs, Singulair cetirizine and pulmonary support.  PAF on chronic anticoagulation continue Xarelto, rate control  Chronic diastolic CHF EF 60 to [id number] G2 DD currently euvolemic monitor intake output Daily weight CKD stage IIIb baseline creatinine 1.47 stable monitor  Seizure disorder on Topamax Hyperlipidemia not on statin due to weakness continue omega-3 fatty acids Hypothyroidism TSH 0.71/23 continue with current levothyroxine GERD continue PPI Mild metabolic acidosis bicarb 19.  Monitor  DVT prophylaxis: xarelto Code Status:   Code Status: DNR Family Communication: plan of care discussed with patient at bedside. Patient status is: Inpatient because of IV antibiotics for pneumonia Level of care: Telemetry Medical   Dispo: The patient is from: Home with her husband            Anticipated disposition: TBD  Mobility Assessment (last 72 hours)     Mobility Assessment   No documentation.          Objective: Vitals last 24 hrs: Vitals:   11/02/21 0800 11/02/21 0900 11/02/21 1000 11/02/21 1100  BP: 125/75 (!) 110/58 (!) 130/54 (!) 128/53  Pulse: 88 85  80 77  Resp: 18 (!) 21 20 20   Temp:      TempSrc:      SpO2: 100% 96% 99% 99%  Weight:      Height:       Weight change:   Physical  Examination: General exam: alert awake,older than stated age, weak appearing. HEENT:Oral mucosa moist, Ear/Nose WNL grossly, dentition normal. Respiratory system: bilaterally diminished BS, no use of accessory muscle Cardiovascular system: S1 & S2 +, No JVD. Gastrointestinal system: Abdomen soft,NT,ND, BS+ Nervous System:Alert, awake, moving extremities and grossly nonfocal Extremities: LE edema NEG distal peripheral pulses palpable.  Skin: No rashes,no icterus. MSK: Normal muscle bulk,tone, power  Medications reviewed:  Scheduled Meds:  acidophilus  1 capsule Oral Daily   aspirin EC  81 mg Oral Daily   budesonide  0.5 mg Nebulization BID   donepezil  10 mg Oral QHS   guaiFENesin  600 mg Oral BID   levothyroxine  88 mcg Oral QAC breakfast   loratadine  10 mg Oral Daily   montelukast  10 mg Oral QHS   multivitamin with minerals  1 tablet Oral Daily   pantoprazole  40 mg Oral BID   pregabalin  50 mg Oral BID   Rivaroxaban  15 mg Oral Daily   senna-docusate  1 tablet Oral QHS   sodium chloride flush  3 mL Intravenous Q12H   sorbitol, milk of mag, mineral oil, glycerin (SMOG) enema  300 mL Rectal Once   topiramate  200 mg Oral BID   Continuous Infusions:  cefTRIAXone (ROCEPHIN)  IV Stopped (11/02/21 1147)   doxycycline (VIBRAMYCIN) IV 100 mg (11/02/21 1146)      Diet Order             DIET DYS 2 Room service appropriate? Yes; Fluid consistency: Thin  Diet effective now                            Intake/Output Summary (Last 24 hours) at 11/02/2021 1308 Last data filed at 11/02/2021 0113 Gross per 24 hour  Intake 600 ml  Output 350 ml  Net 250 ml   Net IO Since Admission: 250 mL [11/02/21 1308]  Wt Readings from Last 3 Encounters:  11/01/21 58.5 kg  10/05/21 58.5 kg  09/15/21 61.2 kg     Unresulted Labs (From admission, onward)     Start     Ordered   11/01/21 0813  TSH  Add-on,   AD        11/01/21 0815   11/01/21 0806  Legionella Pneumophila Serogp 1  Ur Ag  (COPD / Pneumonia / Cellulitis / Lower Extremity Wound)  Once,   R        11/01/21 0815   11/01/21 0805  Expectorated Sputum Assessment w Gram Stain, Rflx to Resp Cult  (COPD / Pneumonia / Cellulitis / Lower Extremity Wound)  Once,   R        11/01/21 0815          Data Reviewed: I have personally reviewed following labs and imaging studies CBC: Recent Labs  Lab 11/01/21 0146 11/02/21 0356  WBC 22.2* 9.8  NEUTROABS 18.0*  --   HGB 13.7 10.5*  HCT 44.7 34.1*  MCV 90.1 90.7  PLT 314 142*   Basic Metabolic Panel: Recent Labs  Lab 11/01/21 0146 11/02/21 0356  NA 142 142  K 3.8 3.5  CL 110 115*  CO2 19* 19*  GLUCOSE 153* 100*  BUN 17 15  CREATININE 1.47* 1.06*  CALCIUM 9.1 8.5*  MG  --  1.6*   GFR: Estimated Creatinine Clearance: 35.7 mL/min (A) (by C-G formula based on SCr of 1.06 mg/dL (H)). Liver Function Tests: Recent Labs  Lab 11/01/21 0146  AST 19  ALT 13  ALKPHOS 102  BILITOT 0.6  PROT 7.8  ALBUMIN 3.0*   No results for input(s): "LIPASE", "AMYLASE" in the last 168 hours. No results for input(s): "AMMONIA" in the last 168 hours. Coagulation Profile: Recent Labs  Lab 11/01/21 0509  INR 1.4*   BNP (last 3 results) No results for input(s): "PROBNP" in the last 8760 hours. HbA1C: No results for input(s): "HGBA1C" in the last 72 hours. CBG: No results for input(s): "GLUCAP" in the last 168 hours. Lipid Profile: No results for input(s): "CHOL", "HDL", "LDLCALC", "TRIG", "CHOLHDL", "LDLDIRECT" in the last 72 hours. Thyroid Function Tests: No results for input(s): "TSH", "T4TOTAL", "FREET4", "T3FREE", "THYROIDAB" in the last 72 hours. Sepsis Labs: Recent Labs  Lab 11/01/21 0146 11/01/21 0630  PROCALCITON 0.13  --   LATICACIDVEN 2.1* 1.7    Recent Results (from the past 240 hour(s))  Blood culture (routine x 2)     Status: None (Preliminary result)   Collection Time: 11/01/21  1:46 AM   Specimen: BLOOD  Result Value Ref Range Status    Specimen Description BLOOD RIGHT ANTECUBITAL  Final   Special Requests   Final    BOTTLES DRAWN AEROBIC AND ANAEROBIC Blood Culture adequate volume   Culture   Final    NO GROWTH 1 DAY Performed at [facility], [place] [address], [place], [place] [address]    Report Status PENDING  Incomplete  Blood culture (routine x 2)     Status: None (Preliminary result)   Collection Time: 11/01/21  4:02 AM   Specimen: BLOOD  Result Value Ref Range Status   Specimen Description BLOOD RIGHT ANTECUBITAL  Final   Special Requests   Final    BOTTLES DRAWN AEROBIC AND ANAEROBIC Blood Culture adequate volume   Culture   Final    NO GROWTH 1 DAY Performed at [facility], [place] [address], [place], [place] [address]    Report Status PENDING  Incomplete  Urine Culture     Status: None   Collection Time: 11/01/21  4:46 AM   Specimen: Urine, Clean Catch  Result Value Ref Range Status   Specimen Description URINE, CLEAN CATCH  Final   Special Requests NONE  Final   Culture   Final    NO GROWTH Performed at [facility], [place] [address], [place], [place] [address]    Report Status 11/02/2021 FINAL  Final  Resp Panel by RT-PCR (Flu A&B, Covid) Anterior Nasal Swab     Status: None   Collection Time: 11/01/21  5:09 AM   Specimen: Anterior Nasal Swab  Result Value Ref Range Status   SARS Coronavirus 2 by RT PCR NEGATIVE NEGATIVE Final    Comment: (NOTE) SARS-CoV-2 target nucleic acids are NOT DETECTED.  The SARS-CoV-2 RNA is generally detectable in upper respiratory specimens during the acute phase of infection. The lowest concentration of SARS-CoV-2 viral copies this assay can detect is 138 copies/mL. A negative result does not preclude SARS-Cov-2 infection and should not be used as the sole basis for treatment or other patient management decisions. A negative result may occur with  improper specimen collection/handling, submission of specimen other than  nasopharyngeal swab, presence  of viral mutation(s) within the areas targeted by this assay, and inadequate number of viral copies(<138 copies/mL). A negative result must be combined with clinical observations, patient history, and epidemiological information. The expected result is Negative.  Fact Sheet for Patients:  BloggerCourse.com  Fact Sheet for Healthcare Providers:  SeriousBroker.it  This test is no t yet approved or cleared by the [place] FDA and  has been authorized for detection and/or diagnosis of SARS-CoV-2 by FDA under an Emergency Use Authorization (EUA). This EUA will remain  in effect (meaning this test can be used) for the duration of the COVID-19 declaration under Section 564(b)(1) of the Act, 21 U.S.C.section 360bbb-3(b)(1), unless the authorization is terminated  or revoked sooner.       Influenza A by PCR NEGATIVE NEGATIVE Final   Influenza B by PCR NEGATIVE NEGATIVE Final    Comment: (NOTE) The Xpert Xpress SARS-CoV-2/FLU/RSV plus assay is intended as an aid in the diagnosis of influenza from Nasopharyngeal swab specimens and should not be used as a sole basis for treatment. Nasal washings and aspirates are unacceptable for Xpert Xpress SARS-CoV-2/FLU/RSV testing.  Fact Sheet for Patients: BloggerCourse.com  Fact Sheet for Healthcare Providers: SeriousBroker.it  This test is not yet approved or cleared by the [place] FDA and has been authorized for detection and/or diagnosis of SARS-CoV-2 by FDA under an Emergency Use Authorization (EUA). This EUA will remain in effect (meaning this test can be used) for the duration of the COVID-19 declaration under Section 564(b)(1) of the Act, 21 U.S.C. section 360bbb-3(b)(1), unless the authorization is terminated or revoked.  Performed at [facility], [address] [place], [place] [address]   MRSA Next Gen by PCR, Nasal      Status: None   Collection Time: 11/01/21  9:00 PM  Result Value Ref Range Status   MRSA by PCR Next Gen NOT DETECTED NOT DETECTED Final    Comment: (NOTE) The GeneXpert MRSA Assay (FDA approved for NASAL specimens only), is one component of a comprehensive MRSA colonization surveillance program. It is not intended to diagnose MRSA infection nor to guide or monitor treatment for MRSA infections. Test performance is not FDA approved in patients less than 40 years old. Performed at [facility], [address], [place], [place] [address]     Antimicrobials: Anti-infectives (From admission, onward)    Start     Dose/Rate Route Frequency Ordered Stop   11/01/21 1200  cefTRIAXone (ROCEPHIN) 2 g in sodium chloride 0.9 % 100 mL IVPB        2 g 200 mL/hr over 30 Minutes Intravenous Every 24 hours 11/01/21 0815 11/06/21 1159   11/01/21 1000  doxycycline (VIBRAMYCIN) 100 mg in sodium chloride 0.9 % 250 mL IVPB        100 mg 125 mL/hr over 120 Minutes Intravenous Every 12 hours 11/01/21 0815     11/01/21 0345  vancomycin (VANCOREADY) IVPB 1250 mg/250 mL        1,250 mg 166.7 mL/hr over 90 Minutes Intravenous  Once 11/01/21 0333 11/01/21 0646   11/01/21 0315  ceFEPIme (MAXIPIME) 2 g in sodium chloride 0.9 % 100 mL IVPB        2 g 200 mL/hr over 30 Minutes Intravenous  Once 11/01/21 0308 11/01/21 0504   11/01/21 0315  metroNIDAZOLE (FLAGYL) IVPB 500 mg        500 mg 100 mL/hr over 60 Minutes Intravenous  Once 11/01/21 0308 11/01/21  [id number]   11/01/21 0315  vancomycin (VANCOCIN) IVPB 1000 mg/200 mL premix  Status:  Discontinued        1,000 mg 200 mL/hr over 60 Minutes Intravenous  Once 11/01/21 0308 11/01/21 0332      Culture/Microbiology    Component Value Date/Time   SDES URINE, CLEAN CATCH 11/01/2021 0446   SPECREQUEST NONE 11/01/2021 0446   CULT  11/01/2021 0446    NO GROWTH Performed at [facility], [place] [address] [place], [place] [address]    REPTSTATUS  11/02/2021 FINAL 11/01/2021 0446    Other culture-see note  Radiology Studies: CT ABDOMEN PELVIS WO CONTRAST  Result Date: 11/01/2021 CLINICAL DATA:  76 year old female with flank pain.  UTI. EXAM: CT ABDOMEN AND PELVIS WITHOUT CONTRAST TECHNIQUE: Multidetector CT imaging of the abdomen and pelvis was performed following the standard protocol without IV contrast. RADIATION DOSE REDUCTION: This exam was performed according to the departmental dose-optimization program which includes automated exposure control, adjustment of the mA and/or kV according to patient size and/or use of iterative reconstruction technique. COMPARISON:  CT Abdomen and Pelvis 07/01/2021 and earlier. FINDINGS: Lower chest: Right lower lobe bronchiectasis and bronchial wall thickening previously demonstrated, now there is confluent right lower lobe peribronchial opacity (series 5, image 1). Contralateral left lung base opacity has also mildly increased. No pericardial or pleural effusion. Prior sternotomy. Hepatobiliary: Surgically absent gallbladder. Negative noncontrast liver. Pancreas: Pancreatic atrophy and dystrophic calcifications of chronic calcific pancreatitis. Spleen: Calcified splenic granulomas are unchanged. Adrenals/Urinary Tract: Negative adrenal glands. Left double-J ureteral stent has been removed since March. Persistent left nephrolithiasis, mostly punctate. But no hydronephrosis or hydroureter today. No convincing right nephrolithiasis. There are right renal vascular calcifications. Bladder is decompressed and unremarkable. Stomach/Bowel: Increased retained stool in the distal large bowel, especially the rectum (series 3, image 75). Upstream nondilated descending colon. Transverse colon mostly decompressed and contains gas. Decompressed right colon. Diminutive or absent appendix. No dilated small bowel. Decompressed stomach and duodenum. No free air or free fluid. Vascular/Lymphatic: Extensive Aortoiliac calcified  atherosclerosis. Vascular patency is not evaluated in the absence of IV contrast. No lymphadenopathy identified. Reproductive: Surgically absent uterus.  Diminutive ovaries. Other: No pelvic free fluid. Musculoskeletal: Chronic sternotomy. Osteopenia. Lower lumbar spine degeneration. No acute osseous abnormality identified. IMPRESSION: 1. Right greater than left lower lobe Bronchopneumonia/Pneumonia, superimposed on some chronic Bronchiectasis. No pleural effusion. 2. No acute or inflammatory process identified in the noncontrast abdomen or pelvis. Left double-J ureteral stent removed since March with persistent left nephrolithiasis but no obstructive uropathy. Increased retained stool in the distal large bowel, consider fecal impaction. Advanced Aortic Atherosclerosis (ICD10-I70.0). Electronically Signed   By: Genevie Ann M.D.   On: 11/01/2021 04:08   DG Toe Great Left  Result Date: 11/01/2021 CLINICAL DATA:  osteo r/o EXAM: LEFT GREAT TOE COMPARISON:  None Available. FINDINGS: Degenerative changes in the 1st MTP joint and IP joint. No acute bony abnormality. Specifically, no fracture, subluxation, or dislocation. No bone destruction to suggest osteomyelitis. IMPRESSION: No acute bony abnormality. Electronically Signed   By: Rolm Baptise M.D.   On: 11/01/2021 03:27   DG Chest 2 View  Result Date: 11/01/2021 CLINICAL DATA:  Shortness of breath, cough EXAM: CHEST - 2 VIEW COMPARISON:  09/13/2021 FINDINGS: Interstitial prominence within the lungs compatible with chronic lung disease, unchanged since prior study. Heart is normal size. Prior CABG. Aortic atherosclerosis. No effusions or acute bony abnormality. IMPRESSION: Chronic changes.  No active disease. Electronically Signed   By:  Rolm Baptise M.D.   On: 11/01/2021 02:20     LOS: 1 day   Antonieta Pert, MD [facility]  11/02/2021, 1:08 PM

## 2021-11-02 NOTE — Progress Notes (Signed)
Speech Language Pathology Treatment: Dysphagia  Patient Details Name: Jenny Kelly MRN: [id number] DOB: 1946-04-19 [name]: 11/02/2021 Time: 4098-1191 SLP Time Calculation (min) (ACUTE ONLY): 17 min  Assessment / Plan / Recommendation Clinical Impression  Pt seen for f/u dysphagia tx with improved overall vocal quality as prior she exhibited a wet, weak, hypophonic voice during evaluation.  Pt denies any difficulty during meals re: dysphagia.  Observed pt with soft/thin liquids using small sips with mod I verbal cues and slow rate with effortful swallow in place.  Pt consumed snack without overt s/s of aspiration and was able to tell SLP precautions required during meal/snack times.  Recommend continuing Dysphagia 2(minced) diet with thin liquids d/t impaired mastication due to limited dentition/pt preference.  ST will s/o in acute setting.  HPI HPI: 76 y.o. female.  With past medical history of diabetes, COPD, chronic kidney disease, congestive heart failure most recent EF 60 to 64%, atrial fibrillation anticoagulated on Xarelto, recent stroke in March who presents to the emergency department with urinary symptoms.    Level 5 caveat, minor dementia.  Husband, [name], provides most of the interview.  He states that patient has been doing well until this evening when she did not want to eat dinner.  He states that after not eating dinner the patient became less responsive and he could not wake her up or get her out of her chair.  He states that she was also incontinent in her chair.  States she had foul urine.  Additionally over the past 1 to 2 days the patient has been complaining about her left great toe hurting and that it has been red and swollen.  He denies any recent new productive cough or fevers, any complaints of abdominal pain, nausea, vomiting or diarrhea.  He states that she has a history of pulmonary fibrosis and does not wear oxygen consistently at home.  Per EMS she was found to have O2  sat of 60% on room air; CT abdomen on 11/01/21 indicated Right greater than left lower lobe Bronchopneumonia/Pneumonia,  superimposed on some chronic Bronchiectasis; BSE completed and pt placed on Dysphagia 2/thin liquids.  ST f/u for dysphagia tx/management.      SLP Plan  Discharge SLP treatment due to goals met.      Recommendations for follow up therapy are one component of a multi-disciplinary discharge planning process, led by the attending physician.  Recommendations may be updated based on patient status, additional functional criteria and insurance authorization.    Recommendations  Diet recommendations: Dysphagia 2 (fine chop);Thin liquid Liquids provided via: Cup;Straw Medication Administration: Whole meds with puree Supervision: Patient able to self feed;Staff to assist with self feeding Compensations: Slow rate;Small sips/bites;Multiple dry swallows after each bite/sip;Effortful swallow Postural Changes and/or Swallow Maneuvers: Seated upright 90 degrees;Upright 30-60 min after meal                Oral Care Recommendations: Oral care BID;Patient independent with oral care Follow Up Recommendations: Follow physician's recommendations for discharge plan and follow up therapies Assistance recommended at discharge: Intermittent Supervision/Assistance SLP Visit Diagnosis: Dysphagia, oropharyngeal phase (R13.12) Plan: Discharge SLP treatment due to (comment)           Elvina Sidle, M.S., CCC-SLP  11/02/2021, 12:14 PM

## 2021-11-02 NOTE — Plan of Care (Signed)

## 2021-11-02 NOTE — Hospital Course (Addendum)
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  In the ED had extensive evaluation and felt to be in sepsis with source as pneumonia in R lung, labs showed AKI and leukocytosis, Lactate 2.1.  Admitted  on antibiotics, speech therapy consulted to evaluate for speech question patient aspirating.  Started on a dysphagia diet.  Uric acid level noted to be elevated at 7.7.  Given colchicine 1.2 mg 1 dose Overnight patient has been more alert awake tolerated meals 75%, leukocytosis resolved from 22-9.8, procalcitonin 0.13Lactic acid down to 1.7, creatinine improved to 1.0 from 1.4

## 2021-11-03 DIAGNOSIS — A419 Sepsis, unspecified organism: Secondary | ICD-10-CM | POA: Diagnosis not present

## 2021-11-03 DIAGNOSIS — J189 Pneumonia, unspecified organism: Secondary | ICD-10-CM | POA: Diagnosis not present

## 2021-11-03 LAB — BASIC METABOLIC PANEL
Anion gap: 8 (ref 5–15)
BUN: 13 mg/dL (ref 8–23)
CO2: 16 mmol/L — ABNORMAL LOW (ref 22–32)
Calcium: 8.5 mg/dL — ABNORMAL LOW (ref 8.9–10.3)
Chloride: 117 mmol/L — ABNORMAL HIGH (ref 98–111)
Creatinine, Ser: 1 mg/dL (ref 0.44–1.00)
GFR, Estimated: 58 mL/min — ABNORMAL LOW (ref >60)
Glucose, Bld: 93 mg/dL (ref 70–99)
Potassium: 3.6 mmol/L (ref 3.5–5.1)
Sodium: 141 mmol/L (ref 135–145)

## 2021-11-03 MED ORDER — SODIUM BICARBONATE 650 MG PO TABS
1300.0000 mg | ORAL_TABLET | Freq: Four times a day (QID) | ORAL | Status: DC
Start: 2021-11-03 — End: 2021-11-04
  Administered 2021-11-03 – 2021-11-04 (×2): 1300 mg via ORAL
  Filled 2021-11-03 (×2): qty 2

## 2021-11-03 MED ORDER — MAGNESIUM OXIDE -MG SUPPLEMENT 400 (240 MG) MG PO TABS
400.0000 mg | ORAL_TABLET | Freq: Two times a day (BID) | ORAL | Status: DC
  Administered 2021-11-03 – 2021-11-04 (×3): 400 mg via ORAL
  Filled 2021-11-03 (×3): qty 1

## 2021-11-03 NOTE — Care Management Important Message (Signed)
Important Message  Patient Details  Name: Jenny Kelly MRN: [id number] Date of Birth: 05/10/1945   Medicare Important Message Given:  Yes     Dorena Bodo 11/03/2021, 3:27 PM

## 2021-11-03 NOTE — Evaluation (Signed)
Physical Therapy Evaluation and D/C Patient Details Name: Jenny Kelly MRN: [id number] DOB: 11-11-1945 [name]: 11/03/2021  History of Present Illness  76 yo F admitted 7/12 presenting w/ increased somnolence.  In the ED  felt to be in sepsis with source as pneumonia in R lung, labs showed AKI and leukocytosis. 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 Pt was able to ambulate on unit with min assist. Husband states that pt is close to baseline and he assists her at all times at home. Pt has no equipment or f/u needs. Will not follow as pt most likely to d/c today.    Recommendations for follow up therapy are one component of a multi-disciplinary discharge planning process, led by the attending physician.  Recommendations may be updated based on patient status, additional functional criteria and insurance authorization.  Follow Up Recommendations No PT follow up      Assistance Recommended at Discharge Intermittent Supervision/Assistance  Patient can return home with the following  A little help with walking and/or transfers;A little help with bathing/dressing/bathroom;Assistance with cooking/housework;Assist for transportation;Help with stairs or ramp for entrance    Equipment Recommendations None recommended by PT  Recommendations for Other Services       Functional Status Assessment Patient has not had a recent decline in their functional status     Precautions / Restrictions Precautions Precautions: Fall Restrictions Weight Bearing Restrictions: No      Mobility  Bed Mobility Overal bed mobility: Needs Assistance Bed Mobility: Supine to Sit     Supine to sit: Min assist     General bed  mobility comments: a little assist to EOB for trunk elevation    Transfers Overall transfer level: Needs assistance Equipment used: Rolling walker (2 wheels) Transfers: Sit to/from Stand Sit to Stand: Min assist, Min guard           General transfer comment: Pt needs steadying assist initially.    Ambulation/Gait Ambulation/Gait assistance: Min assist, Min guard Gait Distance (Feet): 150 Feet Assistive device: Rolling walker (2 wheels) Gait Pattern/deviations: Step-through pattern, Decreased stride length, Trunk flexed   Gait velocity interpretation: 1.31 - 2.62 ft/sec, indicative of limited community ambulator   General Gait Details: Pt was able to ambulate with min guard assist most of walk however as she fatigues needs min assist for safety. Husband states that at baseline when she says she needs to sit, her knees will give out. Had husband follow with chair and pt did need to sit at 150 feet. and needed min assist for steadying while pt moved chair to pt.  Stairs            Wheelchair Mobility    Modified Rankin (Stroke Patients Only)       Balance                                             Pertinent Vitals/Pain Pain Assessment Pain Assessment: Faces Faces Pain Scale: Hurts little more Pain Location: stomach Pain Descriptors / Indicators: Grimacing, Guarding, Discomfort Pain Intervention(s): Limited activity within patient's tolerance, Monitored during session, Repositioned    Home Living Family/patient expects to be discharged  to:: Private residence Living Arrangements: Spouse/significant other Available Help at Discharge: Family;Available 24 hours/day Type of Home: House Home Access: Level entry       Home Layout: One level Home Equipment: Rolling Walker (2 wheels);BSC/3in1;Shower seat;Grab bars - tub/shower;Hand held shower head;Wheelchair Financial trader (4 wheels)      Prior Function Prior Level of Function : Needs  assist             Mobility Comments: Had not been using RW at home but states she takes RW long distance ADLs Comments: spouse assist pt PRN, pt states she cant wipe buttocks well     Hand Dominance   Dominant Hand: Right    Extremity/Trunk Assessment   Upper Extremity Assessment Upper Extremity Assessment: Defer to OT evaluation    Lower Extremity Assessment Lower Extremity Assessment: RLE deficits/detail;LLE deficits/detail RLE Deficits / Details: grossly 3+/5 LLE Deficits / Details: grossly 3+/5    Cervical / Trunk Assessment Cervical / Trunk Assessment: Kyphotic  Communication   Communication: HOH  Cognition Arousal/Alertness: Awake/alert Behavior During Therapy: WFL for tasks assessed/performed Overall Cognitive Status: Within Functional Limits for tasks assessed                                          General Comments      Exercises     Assessment/Plan    PT Assessment Patient does not need any further PT services  PT Problem List         PT Treatment Interventions      PT Goals (Current goals can be found in the Care Plan section)  Acute Rehab PT Goals Patient Stated Goal: to go home PT Goal Formulation: All assessment and education complete, DC therapy    Frequency       Co-evaluation               AM-PAC PT "6 Clicks" Mobility  Outcome Measure Help needed turning from your back to your side while in a flat bed without using bedrails?: None Help needed moving from lying on your back to sitting on the side of a flat bed without using bedrails?: A Little Help needed moving to and from a bed to a chair (including a wheelchair)?: A Little Help needed standing up from a chair using your arms (e.g., wheelchair or bedside chair)?: A Little Help needed to walk in hospital room?: A Little Help needed climbing 3-5 steps with a railing? : A Lot 6 Click Score: 18    End of Session Equipment Utilized During Treatment: Gait  belt Activity Tolerance: Patient tolerated treatment well Patient left: in chair;with call bell/phone within reach;with family/visitor present Nurse Communication: Mobility status PT Visit Diagnosis: Muscle weakness (generalized) (M62.81)    Time: 7846-9629 PT Time Calculation (min) (ACUTE ONLY): 26 min   Charges:   PT Evaluation $PT Eval Low Complexity: 1 Low PT Treatments $Gait Training: 8-22 mins        [facility] M,PT Acute Rehab Services [phone]   Bevelyn Buckles 11/03/2021, 4:05 PM

## 2021-11-03 NOTE — Discharge Summary (Signed)
Physician Discharge Summary  Jenny Kelly EYC:[id number] DOB: 08-28-1945 DOA: 11/01/2021  PCP: Crist Fat, MD  Admit date: 11/01/2021 Discharge date: 11/04/2021 Recommendations for Outpatient Follow-up:  Follow up with PCP in 1 weeks-call for appointment Please obtain BMP/CBC in one week  Discharge Dispo: home Discharge Condition: Stable Code Status:   Code Status: DNR Diet recommendation:  Diet Order             DIET DYS 2 Room service appropriate? 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  In the ED had extensive evaluation and felt to be in sepsis with source as pneumonia in R lung, labs showed AKI and leukocytosis, Lactate 2.1.  Admitted  on antibiotics, speech therapy consulted to evaluate for speech question patient aspirating.  Started on a dysphagia diet.  Uric acid level noted to be elevated at 7.7.  Given colchicine 1.2 mg 1 dose Overnight patient has been more alert awake tolerated meals 75%, leukocytosis resolved from 22-9.8, procalcitonin 0.13Lactic acid down to 1.7, creatinine improved to 1.0 from 1.4  She is clinically improved on RA, ambulating well, cleared by ptot and will dc home on po abx and have her follow up with her pcp I n1 wk   Discharge Diagnoses:  Principal Problem:   Sepsis due to pneumonia [facility]) Active Problems:   Acute metabolic encephalopathy   Great toe pain, left   Fecal impaction (HCC)   History of CVA with residual deficit   Chronic respiratory failure with hypoxia (HCC)   PAF (paroxysmal atrial fibrillation) (HCC)   Hypothyroidism   Hyperlipidemia   Pulmonary fibrosis (HCC)   Seizures  (HCC)   Dementia with behavioral disturbance (HCC)   (HFpEF) heart failure with preserved ejection fraction (HCC)  Assessment and Plan: No notes have been filed under this hospital service. Service: Hospitalist  Sepsis due to pneumonia Right greater than left lower lobe bronchopneumonia/pneumonia superimposed on chronic bronchiectasis: Patient is clinically improved, leukocytosis resolved.  On room air afebrile.  Treated with rocephin, doxycycline  she is on n RA, ambulating well, cleared by ptot and will dc home on po abx and have her follow up with her pcp I n76 wk . Has some cough- sent antitussives rx. Advised to see her PULM Dr Francine Graven as well in 1-2 wks   Acute metabolic encephalopathy/somnolence on admission at this time mentation much improved, and back to baseline   Great toe pain, left: Likely gout, improved with colchicine.  X-rays shows degenerative changes, no known history of gout.Uric acid slightly up at 7.7. consider allopurinol in future in follow up with pcp   Possible fecal impaction/constipation: Noted on CT scan-patient having bowel movement x2 no complaints of constipation, discontinue laxative.    COPD Chronic bronchiectasis Chronic respiratory failure with hypoxia Pulmonary fibrosis: Patient uses oxygen intermittently at home.  Respiratory status stable Continue antibiotics respiratory support-budesonide nebs, Singulair cetirizine. On RA now.   PAF on chronic anticoagulation- Xarelto, rate controlled.   Chronic diastolic CHF EF 60 to 65% G2 DD currently euvolemic monitor intake output Daily weight CKD stage IIIb baseline creatinine 1.47 range.  Current stable monitor  Seizure disorder continue Topamax Hyperlipidemia not on statin due to weakness continue omega-3 fatty acids Hypothyroidism TSH 0.71/23 continue with current levothyroxine GERD continue PPI Mild metabolic acidosis bicarb [id number] but hyperchloremic after IV fluids monitor labs in the morning.   Monitor   Consults: none Subjective: AAOX3.   Discharge Exam: Vitals:   11/04/21 0441 11/04/21 0736  BP: (!) 125/56 (!) 158/55  Pulse: 62 68  Resp: 17 20  Temp: 98 F (36.7 C) 98 F (36.7 C)  SpO2: 97% 96%   General: Pt is alert, awake, not in acute distress Cardiovascular: RRR, S1/S2 +, no rubs, no gallops Respiratory: CTA bilaterally, no wheezing, no rhonchi Abdominal: Soft, NT, ND, bowel sounds + Extremities: no edema, no cyanosis  Discharge Instructions  Discharge Instructions     Discharge instructions   Complete by: As directed    Please call call MD or return to ER for similar or worsening recurring problem that brought you to hospital or if any fever,nausea/vomiting,abdominal pain, uncontrolled pain, chest pain,  shortness of breath or any other alarming symptoms.  Please follow-up your doctor as instructed in a week time and call the office for appointment.  Please avoid alcohol, smoking, or any other illicit substance and maintain healthy habits including taking your regular medications as prescribed.  You were cared for by a hospitalist during your hospital stay. If you have any questions about your discharge medications or the care you received while you were in the hospital after you are discharged, you can call the unit and ask to speak with the hospitalist on call if the hospitalist that took care of you is not available.  Once you are discharged, your primary care physician will handle any further medical issues. Please note that NO REFILLS for any discharge medications will be authorized once you are discharged, as it is imperative that you return to your primary care physician (or establish a relationship with a primary care physician if you do not have one) for your aftercare needs so that they can reassess your need for medications and monitor your lab values   Increase activity slowly   Complete by: As directed       Allergies as of 11/04/2021        Reactions   Ciprofloxacin Hives   Ask patient   Prochlorperazine Edisylate Other (See Comments)   Cant swallow Muscle cramping   Morphine Other (See Comments)   Confusion   Prednisone Other (See Comments)   unknown   Reduced Iso-alpha Acids Complex Other (See Comments)   unknown   Statins Other (See Comments)   Muscle weakness   Sulfa Antibiotics Swelling   Azithromycin Rash   Latex Rash        Medication List     TAKE these medications    acetaminophen 500 MG tablet Commonly known as: TYLENOL Take 500 mg by mouth every 6 (six) hours as needed for mild pain or moderate pain.   aspirin EC 81 MG tablet Take 1 tablet (81 mg total) by mouth daily. Swallow whole.   budesonide 0.5 MG/2ML nebulizer solution Commonly known as: PULMICORT Take 2 mLs (0.5 mg total) by nebulization in the morning and at bedtime.   cefadroxil 500 MG capsule Commonly known as: DURICEF Take 1 capsule (500 mg total) by mouth 2 (two) times daily for 5 days.   cetirizine 10 MG tablet Commonly known as: ZYRTEC Take 10 mg by mouth daily.   donepezil 10 MG tablet Commonly known as: ARICEPT  Take 1 tablet (10 mg total) by mouth at bedtime.   doxycycline 100 MG tablet Commonly known as: ADOXA Take 1 tablet (100 mg total) by mouth 2 (two) times daily for 5 days.   Fish Oil 1200 MG Caps Take 1,200 mg by mouth 2 (two) times daily.   guaiFENesin-dextromethorphan 100-10 MG/5ML syrup Commonly known as: ROBITUSSIN DM Take 10 mLs by mouth every 4 (four) hours as needed for cough.   HYDROcodone-acetaminophen 5-325 MG tablet Commonly known as: NORCO/VICODIN Take 1 tablet by mouth every 6 (six) hours as needed for moderate pain.   levothyroxine 88 MCG tablet Commonly known as: SYNTHROID Take 88 mcg by mouth daily before breakfast.   montelukast 10 MG tablet Commonly known as: SINGULAIR Take 1 tablet (10 mg total) by mouth at bedtime.   multivitamin with minerals Tabs tablet Take 1 tablet by  mouth daily.   omeprazole 40 MG capsule Commonly known as: PRILOSEC Take 1 capsule (40 mg total) by mouth daily. What changed: when to take this   oxybutynin 5 MG tablet Commonly known as: DITROPAN Take 0.5 tablets (2.5 mg total) by mouth 3 (three) times daily.   Percocet 5-325 MG tablet Generic drug: oxyCODONE-acetaminophen Take 1 tablet by mouth every 4 (four) hours as needed for severe pain.   pravastatin 80 MG tablet Commonly known as: PRAVACHOL Take 1 tablet (80 mg total) by mouth every evening.   pregabalin 50 MG capsule Commonly known as: LYRICA TAKE 1 CAPSULE BY MOUTH 2 TIMES DAILY.   PROBIOTIC PO Take 15 Billion Cells by mouth daily.   Rivaroxaban 15 MG Tabs tablet Commonly known as: XARELTO Take 1 tablet (15 mg total) by mouth daily.   topiramate 200 MG tablet Commonly known as: TOPAMAX Take 1 tablet (200 mg total) by mouth 2 (two) times daily.   URINARY HEALTH/CRANBERRY PO Take 1 tablet by mouth 2 (two) times daily.        Follow-up Information     Crist Fat, MD Follow up in 1 week(s).   Specialty: Internal Medicine Contact information: [address] [place] [address] [phone]                Allergies  Allergen Reactions   Ciprofloxacin Hives    Ask patient   Prochlorperazine Edisylate Other (See Comments)    Cant swallow Muscle cramping   Morphine Other (See Comments)    Confusion   Prednisone Other (See Comments)    unknown   Reduced Iso-Alpha Acids Complex Other (See Comments)    unknown   Statins Other (See Comments)    Muscle weakness    Sulfa Antibiotics Swelling   Azithromycin Rash   Latex Rash    The results of significant diagnostics from this hospitalization (including imaging, microbiology, ancillary and laboratory) are listed below for reference.    Microbiology: Recent Results (from the past 240 hour(s))  Blood culture (routine x 2)     Status: None (Preliminary result)   Collection Time:  11/01/21  1:46 AM   Specimen: BLOOD  Result Value Ref Range Status   Specimen Description BLOOD RIGHT ANTECUBITAL  Final   Special Requests   Final    BOTTLES DRAWN AEROBIC AND ANAEROBIC Blood Culture adequate volume   Culture   Final    NO GROWTH 3 DAYS Performed at [facility], [address], [place], [place] [address]    Report Status PENDING  Incomplete  Blood culture (routine x 2)  Status: None (Preliminary result)   Collection Time: 11/01/21  4:02 AM   Specimen: BLOOD  Result Value Ref Range Status   Specimen Description BLOOD RIGHT ANTECUBITAL  Final   Special Requests   Final    BOTTLES DRAWN AEROBIC AND ANAEROBIC Blood Culture adequate volume   Culture   Final    NO GROWTH 3 DAYS Performed at [facility], [address] [place], [place] [address]    Report Status PENDING  Incomplete  Urine Culture     Status: None   Collection Time: 11/01/21  4:46 AM   Specimen: Urine, Clean Catch  Result Value Ref Range Status   Specimen Description URINE, CLEAN CATCH  Final   Special Requests NONE  Final   Culture   Final    NO GROWTH Performed at [facility], [address], [place], [place] [address]    Report Status 11/02/2021 FINAL  Final  Resp Panel by RT-PCR (Flu A&B, Covid) Anterior Nasal Swab     Status: None   Collection Time: 11/01/21  5:09 AM   Specimen: Anterior Nasal Swab  Result Value Ref Range Status   SARS Coronavirus 2 by RT PCR NEGATIVE NEGATIVE Final    Comment: (NOTE) SARS-CoV-2 target nucleic acids are NOT DETECTED.  The SARS-CoV-2 RNA is generally detectable in upper respiratory specimens during the acute phase of infection. The lowest concentration of SARS-CoV-2 viral copies this assay can detect is 138 copies/mL. A negative result does not preclude SARS-Cov-2 infection and should not be used as the sole basis for treatment or other patient management decisions. A negative result may occur with  improper specimen  collection/handling, submission of specimen other than nasopharyngeal swab, presence of viral mutation(s) within the areas targeted by this assay, and inadequate number of viral copies(<138 copies/mL). A negative result must be combined with clinical observations, patient history, and epidemiological information. The expected result is Negative.  Fact Sheet for Patients:  BloggerCourse.com  Fact Sheet for Healthcare Providers:  SeriousBroker.it  This test is no t yet approved or cleared by the [place] FDA and  has been authorized for detection and/or diagnosis of SARS-CoV-2 by FDA under an Emergency Use Authorization (EUA). This EUA will remain  in effect (meaning this test can be used) for the duration of the COVID-19 declaration under Section 564(b)(1) of the Act, 21 U.S.C.section 360bbb-3(b)(1), unless the authorization is terminated  or revoked sooner.       Influenza A by PCR NEGATIVE NEGATIVE Final   Influenza B by PCR NEGATIVE NEGATIVE Final    Comment: (NOTE) The Xpert Xpress SARS-CoV-2/FLU/RSV plus assay is intended as an aid in the diagnosis of influenza from Nasopharyngeal swab specimens and should not be used as a sole basis for treatment. Nasal washings and aspirates are unacceptable for Xpert Xpress SARS-CoV-2/FLU/RSV testing.  Fact Sheet for Patients: BloggerCourse.com  Fact Sheet for Healthcare Providers: SeriousBroker.it  This test is not yet approved or cleared by the [place] FDA and has been authorized for detection and/or diagnosis of SARS-CoV-2 by FDA under an Emergency Use Authorization (EUA). This EUA will remain in effect (meaning this test can be used) for the duration of the COVID-19 declaration under Section 564(b)(1) of the Act, 21 U.S.C. section 360bbb-3(b)(1), unless the authorization is terminated or revoked.  Performed at [facility], [address] [place], [place] [address]   MRSA Next Gen by PCR, Nasal     Status: None  Collection Time: 11/01/21  9:00 PM  Result Value Ref Range Status   MRSA by PCR Next Gen NOT DETECTED NOT DETECTED Final    Comment: (NOTE) The GeneXpert MRSA Assay (FDA approved for NASAL specimens only), is one component of a comprehensive MRSA colonization surveillance program. It is not intended to diagnose MRSA infection nor to guide or monitor treatment for MRSA infections. Test performance is not FDA approved in patients less than 87 years old. Performed at [facility], [place] [address], [place], [place] [address]     Procedures/Studies: CT ABDOMEN PELVIS WO CONTRAST  Result Date: 11/01/2021 CLINICAL DATA:  76 year old female with flank pain.  UTI. EXAM: CT ABDOMEN AND PELVIS WITHOUT CONTRAST TECHNIQUE: Multidetector CT imaging of the abdomen and pelvis was performed following the standard protocol without IV contrast. RADIATION DOSE REDUCTION: This exam was performed according to the departmental dose-optimization program which includes automated exposure control, adjustment of the mA and/or kV according to patient size and/or use of iterative reconstruction technique. COMPARISON:  CT Abdomen and Pelvis 07/01/2021 and earlier. FINDINGS: Lower chest: Right lower lobe bronchiectasis and bronchial wall thickening previously demonstrated, now there is confluent right lower lobe peribronchial opacity (series 5, image 1). Contralateral left lung base opacity has also mildly increased. No pericardial or pleural effusion. Prior sternotomy. Hepatobiliary: Surgically absent gallbladder. Negative noncontrast liver. Pancreas: Pancreatic atrophy and dystrophic calcifications of chronic calcific pancreatitis. Spleen: Calcified splenic granulomas are unchanged. Adrenals/Urinary Tract: Negative adrenal glands. Left double-J ureteral stent has been removed since March. Persistent left  nephrolithiasis, mostly punctate. But no hydronephrosis or hydroureter today. No convincing right nephrolithiasis. There are right renal vascular calcifications. Bladder is decompressed and unremarkable. Stomach/Bowel: Increased retained stool in the distal large bowel, especially the rectum (series 3, image 75). Upstream nondilated descending colon. Transverse colon mostly decompressed and contains gas. Decompressed right colon. Diminutive or absent appendix. No dilated small bowel. Decompressed stomach and duodenum. No free air or free fluid. Vascular/Lymphatic: Extensive Aortoiliac calcified atherosclerosis. Vascular patency is not evaluated in the absence of IV contrast. No lymphadenopathy identified. Reproductive: Surgically absent uterus.  Diminutive ovaries. Other: No pelvic free fluid. Musculoskeletal: Chronic sternotomy. Osteopenia. Lower lumbar spine degeneration. No acute osseous abnormality identified. IMPRESSION: 1. Right greater than left lower lobe Bronchopneumonia/Pneumonia, superimposed on some chronic Bronchiectasis. No pleural effusion. 2. No acute or inflammatory process identified in the noncontrast abdomen or pelvis. Left double-J ureteral stent removed since March with persistent left nephrolithiasis but no obstructive uropathy. Increased retained stool in the distal large bowel, consider fecal impaction. Advanced Aortic Atherosclerosis (ICD10-I70.0). Electronically Signed   By: Genevie Ann M.D.   On: 11/01/2021 04:08   DG Toe Great Left  Result Date: 11/01/2021 CLINICAL DATA:  osteo r/o EXAM: LEFT GREAT TOE COMPARISON:  None Available. FINDINGS: Degenerative changes in the 1st MTP joint and IP joint. No acute bony abnormality. Specifically, no fracture, subluxation, or dislocation. No bone destruction to suggest osteomyelitis. IMPRESSION: No acute bony abnormality. Electronically Signed   By: Rolm Baptise M.D.   On: 11/01/2021 03:27   DG Chest 2 View  Result Date: 11/01/2021 CLINICAL  DATA:  Shortness of breath, cough EXAM: CHEST - 2 VIEW COMPARISON:  09/13/2021 FINDINGS: Interstitial prominence within the lungs compatible with chronic lung disease, unchanged since prior study. Heart is normal size. Prior CABG. Aortic atherosclerosis. No effusions or acute bony abnormality. IMPRESSION: Chronic changes.  No active disease. Electronically Signed   By: Rolm Baptise M.D.   On: 11/01/2021  02:20    Labs: BNP (last 3 results) Recent Labs    05/01/21 1558  BNP [id number]*   Basic Metabolic Panel: Recent Labs  Lab 11/01/21 0146 11/02/21 0356 11/03/21 0212 11/04/21 0155  NA 142 142 141 143  K 3.8 3.5 3.6 3.7  CL 110 115* 117* 118*  CO2 19* 19* 16* 21*  GLUCOSE 153* 100* 93 94  BUN 17 15 13 10   CREATININE 1.47* 1.06* 1.00 1.00  CALCIUM 9.1 8.5* 8.5* 8.4*  MG  --  1.6*  --   --    Liver Function Tests: Recent Labs  Lab 11/01/21 0146  AST 19  ALT 13  ALKPHOS 102  BILITOT 0.6  PROT 7.8  ALBUMIN 3.0*   No results for input(s): "LIPASE", "AMYLASE" in the last 168 hours. No results for input(s): "AMMONIA" in the last 168 hours. CBC: Recent Labs  Lab 11/01/21 0146 11/02/21 0356  WBC 22.2* 9.8  NEUTROABS 18.0*  --   HGB 13.7 10.5*  HCT 44.7 34.1*  MCV 90.1 90.7  PLT 314 142*   Cardiac Enzymes: No results for input(s): "CKTOTAL", "CKMB", "CKMBINDEX", "TROPONINI" in the last 168 hours. BNP: Invalid input(s): "POCBNP" CBG: No results for input(s): "GLUCAP" in the last 168 hours. D-Dimer No results for input(s): "DDIMER" in the last 72 hours. Hgb A1c No results for input(s): "HGBA1C" in the last 72 hours. Lipid Profile No results for input(s): "CHOL", "HDL", "LDLCALC", "TRIG", "CHOLHDL", "LDLDIRECT" in the last 72 hours. Thyroid function studies No results for input(s): "TSH", "T4TOTAL", "T3FREE", "THYROIDAB" in the last 72 hours.  Invalid input(s): "FREET3" Anemia work up No results for input(s): "VITAMINB12", "FOLATE", "FERRITIN", "TIBC", "IRON",  "RETICCTPCT" in the last 72 hours. Urinalysis    Component Value Date/Time   COLORURINE YELLOW 11/01/2021 [place] 11/01/2021 0446   LABSPEC 1.013 11/01/2021 0446   PHURINE 5.0 11/01/2021 0446   GLUCOSEU NEGATIVE 11/01/2021 0446   HGBUR NEGATIVE 11/01/2021 0446   BILIRUBINUR NEGATIVE 11/01/2021 0446   KETONESUR NEGATIVE 11/01/2021 0446   PROTEINUR NEGATIVE 11/01/2021 0446   NITRITE NEGATIVE 11/01/2021 0446   LEUKOCYTESUR NEGATIVE 11/01/2021 0446   Sepsis Labs Recent Labs  Lab 11/01/21 0146 11/02/21 0356  WBC 22.2* 9.8   Microbiology Recent Results (from the past 240 hour(s))  Blood culture (routine x 2)     Status: None (Preliminary result)   Collection Time: 11/01/21  1:46 AM   Specimen: BLOOD  Result Value Ref Range Status   Specimen Description BLOOD RIGHT ANTECUBITAL  Final   Special Requests   Final    BOTTLES DRAWN AEROBIC AND ANAEROBIC Blood Culture adequate volume   Culture   Final    NO GROWTH 3 DAYS Performed at [facility], [place] [address] [place], [place] [address]    Report Status PENDING  Incomplete  Blood culture (routine x 2)     Status: None (Preliminary result)   Collection Time: 11/01/21  4:02 AM   Specimen: BLOOD  Result Value Ref Range Status   Specimen Description BLOOD RIGHT ANTECUBITAL  Final   Special Requests   Final    BOTTLES DRAWN AEROBIC AND ANAEROBIC Blood Culture adequate volume   Culture   Final    NO GROWTH 3 DAYS Performed at [facility], [place] [address] [place], [place] [address]    Report Status PENDING  Incomplete  Urine Culture     Status: None   Collection Time: 11/01/21  4:46 AM  Specimen: Urine, Clean Catch  Result Value Ref Range Status   Specimen Description URINE, CLEAN CATCH  Final   Special Requests NONE  Final   Culture   Final    NO GROWTH Performed at [facility], [address], [place], [place] [address]    Report Status 11/02/2021 FINAL  Final  Resp Panel by RT-PCR  (Flu A&B, Covid) Anterior Nasal Swab     Status: None   Collection Time: 11/01/21  5:09 AM   Specimen: Anterior Nasal Swab  Result Value Ref Range Status   SARS Coronavirus 2 by RT PCR NEGATIVE NEGATIVE Final    Comment: (NOTE) SARS-CoV-2 target nucleic acids are NOT DETECTED.  The SARS-CoV-2 RNA is generally detectable in upper respiratory specimens during the acute phase of infection. The lowest concentration of SARS-CoV-2 viral copies this assay can detect is 138 copies/mL. A negative result does not preclude SARS-Cov-2 infection and should not be used as the sole basis for treatment or other patient management decisions. A negative result may occur with  improper specimen collection/handling, submission of specimen other than nasopharyngeal swab, presence of viral mutation(s) within the areas targeted by this assay, and inadequate number of viral copies(<138 copies/mL). A negative result must be combined with clinical observations, patient history, and epidemiological information. The expected result is Negative.  Fact Sheet for Patients:  EntrepreneurPulse.com.au  Fact Sheet for Healthcare Providers:  IncredibleEmployment.be  This test is no t yet approved or cleared by the [place] FDA and  has been authorized for detection and/or diagnosis of SARS-CoV-2 by FDA under an Emergency Use Authorization (EUA). This EUA will remain  in effect (meaning this test can be used) for the duration of the COVID-19 declaration under Section 564(b)(1) of the Act, 21 U.S.C.section 360bbb-3(b)(1), unless the authorization is terminated  or revoked sooner.       Influenza A by PCR NEGATIVE NEGATIVE Final   Influenza B by PCR NEGATIVE NEGATIVE Final    Comment: (NOTE) The Xpert Xpress SARS-CoV-2/FLU/RSV plus assay is intended as an aid in the diagnosis of influenza from Nasopharyngeal swab specimens and should not be used as a sole basis for  treatment. Nasal washings and aspirates are unacceptable for Xpert Xpress SARS-CoV-2/FLU/RSV testing.  Fact Sheet for Patients: EntrepreneurPulse.com.au  Fact Sheet for Healthcare Providers: IncredibleEmployment.be  This test is not yet approved or cleared by the [place] FDA and has been authorized for detection and/or diagnosis of SARS-CoV-2 by FDA under an Emergency Use Authorization (EUA). This EUA will remain in effect (meaning this test can be used) for the duration of the COVID-19 declaration under Section 564(b)(1) of the Act, 21 U.S.C. section 360bbb-3(b)(1), unless the authorization is terminated or revoked.  Performed at [facility], [place] [address], [place], [place] [address]   MRSA Next Gen by PCR, Nasal     Status: None   Collection Time: 11/01/21  9:00 PM  Result Value Ref Range Status   MRSA by PCR Next Gen NOT DETECTED NOT DETECTED Final    Comment: (NOTE) The GeneXpert MRSA Assay (FDA approved for NASAL specimens only), is one component of a comprehensive MRSA colonization surveillance program. It is not intended to diagnose MRSA infection nor to guide or monitor treatment for MRSA infections. Test performance is not FDA approved in patients less than 89 years old. Performed at [facility], [place] [address] [place], [place] [address]      Time coordinating discharge: 25 minutes  SIGNED: Antonieta Pert, MD  [facility] 11/04/2021, 10:02 AM  If 7PM-7AM, please contact night-coverage [email or web address]

## 2021-11-03 NOTE — Progress Notes (Signed)
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  In the ED had extensive evaluation and felt to be in sepsis with source as pneumonia in R lung, labs showed AKI and leukocytosis, Lactate 2.1.  Admitted  on antibiotics, speech therapy consulted to evaluate for speech question patient aspirating.  Started on a dysphagia diet.  Uric acid level noted to be elevated at 7.7.  Given colchicine 1.2 mg 1 dose Overnight patient has been more alert awake tolerated meals 75%, leukocytosis resolved from 22-9.8, procalcitonin 0.13Lactic acid down to 1.7, creatinine improved to 1.0 from 1.4    Subjective: Seen and examined this morning.  Patient feeling much improved today alert awake at baseline, husband at the bedside, having some cough with yellow/clear sputum. On room air. Assessment and Plan: Principal Problem:   Sepsis due to pneumonia [facility]) Active Problems:   Acute metabolic encephalopathy   Great toe pain, left   Fecal impaction (HCC)   History of CVA with residual deficit   Chronic respiratory failure with hypoxia (HCC)   PAF (paroxysmal atrial fibrillation) (HCC)   Hypothyroidism   Hyperlipidemia   Pulmonary fibrosis (HCC)   Seizures (HCC)   Dementia with behavioral disturbance (HCC)   (HFpEF) heart failure with preserved ejection fraction (HCC)   Sepsis due to pneumonia Right greater than left lower lobe bronchopneumonia/pneumonia superimposed on chronic bronchiectasis: Patient is clinically improved, leukocytosis resolved.  On room air afebrile.  Continue current empiric Rocephin, doxycycline on a 24 hours  then transition to p.o. for discharge home continue ambulation PT OT requested.  Acute metabolic encephalopathy/somnolence on admission at this time mentation much improved, and back to baseline  Great toe pain, left: Likely gout, improved with colchicine.  X-rays shows degenerative changes, no known history of gout.Uric acid slightly up at 7.7.  Possible fecal impaction/constipation: Noted on CT scan-patient having bowel movement x2 no complaints of constipation, discontinue laxative.   COPD Chronic bronchiectasis Chronic respiratory failure with hypoxia Pulmonary fibrosis: Patient uses oxygen intermittently at home.  Respiratory status stable on room air no wheezing.  Continue antibiotics respiratory support-budesonide nebs, Singulair cetirizine  PAF on chronic anticoagulation- Xarelto, rate controlled.  Chronic diastolic CHF EF 60 to 65% G2 DD currently euvolemic monitor intake output Daily weight CKD stage IIIb baseline creatinine 1.47 range.  Current stable monitor  Seizure disorder continue Topamax Hyperlipidemia not on statin due to weakness continue omega-3 fatty acids Hypothyroidism TSH 0.71/23 continue with current levothyroxine GERD continue PPI Mild metabolic acidosis bicarb 19>17 but hyperchloremic after IV fluids monitor labs in the morning.  Monitor  DVT prophylaxis: xarelto Code Status:   Code Status: DNR Family Communication: plan of care discussed with patient at bedside. Patient status is: Inpatient because of IV antibiotics for pneumonia Level of care: Telemetry Medical   Dispo: The patient is from: Home with her husband            Anticipated disposition: Anticipate discharge home in next 24 hours after 1 more day of IV antibiotics   Mobility Assessment (last 72 hours)     Mobility Assessment     Row Name 11/02/21 1938 11/02/21 1430  Does patient have an order for bedrest or is patient medically unstable No - Continue assessment No - Continue  assessment      What is the highest level of mobility based on the progressive mobility assessment? Level 3 (Stands with assist) - Balance while standing  and cannot march in place Level 3 (Stands with assist) - Balance while standing  and cannot march in place      Is the above level different from baseline mobility prior to current illness? Yes - Recommend PT order Yes - Recommend PT order              Objective: Vitals last 24 hrs: Vitals:   11/03/21 0500 11/03/21 0726 11/03/21 0735 11/03/21 1423  BP: (!) 124/57 134/61  118/69  Pulse: 64 67  72  Resp: 19 15  15   Temp: 97.8 F (36.6 C) 97.7 F (36.5 C)  98.1 F (36.7 C)  TempSrc: Oral Oral  Oral  SpO2: 98%  97%   Weight:      Height:       Weight change:   Physical Examination: General exam: AAOX3, THIN,older than stated age, weak appearing. HEENT:Oral mucosa moist, Ear/Nose WNL grossly, dentition normal. Respiratory system: bilaterally CLEAR, no use of accessory muscle Cardiovascular system: S1 & S2 +, No JVD,. Gastrointestinal system: Abdomen soft,NT,ND,BS+ Nervous System:Alert, awake, moving extremities and grossly nonfocal Extremities: LE ankle edema NEG, distal peripheral pulses palpable.  Skin: No rashes,no icterus. MSK: Normal muscle bulk,tone, power   Medications reviewed:  Scheduled Meds:  acidophilus  1 capsule Oral Daily   aspirin EC  81 mg Oral Daily   budesonide  0.5 mg Nebulization BID   donepezil  10 mg Oral QHS   guaiFENesin  600 mg Oral BID   levothyroxine  88 mcg Oral QAC breakfast   loratadine  10 mg Oral Daily   magnesium oxide  400 mg Oral BID   montelukast  10 mg Oral QHS   multivitamin with minerals  1 tablet Oral Daily   pantoprazole  40 mg Oral BID   pregabalin  50 mg Oral BID   Rivaroxaban  15 mg Oral Daily   sodium chloride flush  3 mL Intravenous Q12H   topiramate  200 mg Oral BID   Continuous Infusions:  cefTRIAXone (ROCEPHIN)  IV 2 g (11/03/21 1249)   doxycycline (VIBRAMYCIN)  IV 100 mg (11/03/21 1045)      Diet Order             DIET DYS 2 Room service appropriate? Yes; Fluid consistency: Thin  Diet effective now                           No intake or output data in the 24 hours ending 11/03/21 1432  Net IO Since Admission: 250 mL [11/03/21 1432]  Wt Readings from Last 3 Encounters:  11/01/21 58.5 kg  10/05/21 58.5 kg  09/15/21 61.2 kg     Unresulted Labs (From admission, onward)     Start     Ordered   11/03/21 [id number]  Basic metabolic panel  Daily at 5am,   R      11/02/21 1316   11/01/21 0813  TSH  Add-on,   AD        11/01/21 0815   11/01/21 0806  Legionella Pneumophila Serogp 1 Ur Ag  (COPD / Pneumonia / Cellulitis / Lower Extremity Wound)  Once,   R  11/01/21 0815   11/01/21 0805  Expectorated Sputum Assessment w Gram Stain, Rflx to Resp Cult  (COPD / Pneumonia / Cellulitis / Lower Extremity Wound)  Once,   R        11/01/21 0815          Data Reviewed: I have personally reviewed following labs and imaging studies CBC: Recent Labs  Lab 11/01/21 0146 11/02/21 0356  WBC 22.2* 9.8  NEUTROABS 18.0*  --   HGB 13.7 10.5*  HCT 44.7 34.1*  MCV 90.1 90.7  PLT 314 [id number]*   Basic Metabolic Panel: Recent Labs  Lab 11/01/21 0146 11/02/21 0356 11/03/21 0212  NA 142 142 141  K 3.8 3.5 3.6  CL 110 115* 117*  CO2 19* 19* 16*  GLUCOSE 153* 100* 93  BUN 17 15 13   CREATININE 1.47* 1.06* 1.00  CALCIUM 9.1 8.5* 8.5*  MG  --  1.6*  --    GFR: Estimated Creatinine Clearance: 37.9 mL/min (by C-G formula based on SCr of 1 mg/dL). Liver Function Tests: Recent Labs  Lab 11/01/21 0146  AST 19  ALT 13  ALKPHOS 102  BILITOT 0.6  PROT 7.8  ALBUMIN 3.0*   No results for input(s): "LIPASE", "AMYLASE" in the last 168 hours. No results for input(s): "AMMONIA" in the last 168 hours. Coagulation Profile: Recent Labs  Lab 11/01/21 0509  INR 1.4*   BNP (last 3 results) No results for input(s): "PROBNP" in the last 8760  hours. HbA1C: No results for input(s): "HGBA1C" in the last 72 hours. CBG: No results for input(s): "GLUCAP" in the last 168 hours. Lipid Profile: No results for input(s): "CHOL", "HDL", "LDLCALC", "TRIG", "CHOLHDL", "LDLDIRECT" in the last 72 hours. Thyroid Function Tests: No results for input(s): "TSH", "T4TOTAL", "FREET4", "T3FREE", "THYROIDAB" in the last 72 hours. Sepsis Labs: Recent Labs  Lab 11/01/21 0146 11/01/21 0630  PROCALCITON 0.13  --   LATICACIDVEN 2.1* 1.7    Recent Results (from the past 240 hour(s))  Blood culture (routine x 2)     Status: None (Preliminary result)   Collection Time: 11/01/21  1:46 AM   Specimen: BLOOD  Result Value Ref Range Status   Specimen Description BLOOD RIGHT ANTECUBITAL  Final   Special Requests   Final    BOTTLES DRAWN AEROBIC AND ANAEROBIC Blood Culture adequate volume   Culture   Final    NO GROWTH 2 DAYS Performed at [facility], [place] [address] [place], [place] [address]    Report Status PENDING  Incomplete  Blood culture (routine x 2)     Status: None (Preliminary result)   Collection Time: 11/01/21  4:02 AM   Specimen: BLOOD  Result Value Ref Range Status   Specimen Description BLOOD RIGHT ANTECUBITAL  Final   Special Requests   Final    BOTTLES DRAWN AEROBIC AND ANAEROBIC Blood Culture adequate volume   Culture   Final    NO GROWTH 2 DAYS Performed at [facility], [place] [address] [place], [place] [address]    Report Status PENDING  Incomplete  Urine Culture     Status: None   Collection Time: 11/01/21  4:46 AM   Specimen: Urine, Clean Catch  Result Value Ref Range Status   Specimen Description URINE, CLEAN CATCH  Final   Special Requests NONE  Final   Culture   Final    NO GROWTH Performed at [facility], [place] [address], [place], [place] [address]  Report Status 11/02/2021 FINAL  Final  Resp Panel by RT-PCR (Flu A&B, Covid) Anterior Nasal Swab     Status: None   Collection Time: 11/01/21   5:09 AM   Specimen: Anterior Nasal Swab  Result Value Ref Range Status   SARS Coronavirus 2 by RT PCR NEGATIVE NEGATIVE Final    Comment: (NOTE) SARS-CoV-2 target nucleic acids are NOT DETECTED.  The SARS-CoV-2 RNA is generally detectable in upper respiratory specimens during the acute phase of infection. The lowest concentration of SARS-CoV-2 viral copies this assay can detect is 138 copies/mL. A negative result does not preclude SARS-Cov-2 infection and should not be used as the sole basis for treatment or other patient management decisions. A negative result may occur with  improper specimen collection/handling, submission of specimen other than nasopharyngeal swab, presence of viral mutation(s) within the areas targeted by this assay, and inadequate number of viral copies(<138 copies/mL). A negative result must be combined with clinical observations, patient history, and epidemiological information. The expected result is Negative.  Fact Sheet for Patients:  BloggerCourse.com  Fact Sheet for Healthcare Providers:  SeriousBroker.it  This test is no t yet approved or cleared by the [place] FDA and  has been authorized for detection and/or diagnosis of SARS-CoV-2 by FDA under an Emergency Use Authorization (EUA). This EUA will remain  in effect (meaning this test can be used) for the duration of the COVID-19 declaration under Section 564(b)(1) of the Act, 21 U.S.C.section 360bbb-3(b)(1), unless the authorization is terminated  or revoked sooner.       Influenza A by PCR NEGATIVE NEGATIVE Final   Influenza B by PCR NEGATIVE NEGATIVE Final    Comment: (NOTE) The Xpert Xpress SARS-CoV-2/FLU/RSV plus assay is intended as an aid in the diagnosis of influenza from Nasopharyngeal swab specimens and should not be used as a sole basis for treatment. Nasal washings and aspirates are unacceptable for Xpert Xpress  SARS-CoV-2/FLU/RSV testing.  Fact Sheet for Patients: BloggerCourse.com  Fact Sheet for Healthcare Providers: SeriousBroker.it  This test is not yet approved or cleared by the [place] FDA and has been authorized for detection and/or diagnosis of SARS-CoV-2 by FDA under an Emergency Use Authorization (EUA). This EUA will remain in effect (meaning this test can be used) for the duration of the COVID-19 declaration under Section 564(b)(1) of the Act, 21 U.S.C. section 360bbb-3(b)(1), unless the authorization is terminated or revoked.  Performed at [facility], [address], [place], [place] [address]   MRSA Next Gen by PCR, Nasal     Status: None   Collection Time: 11/01/21  9:00 PM  Result Value Ref Range Status   MRSA by PCR Next Gen NOT DETECTED NOT DETECTED Final    Comment: (NOTE) The GeneXpert MRSA Assay (FDA approved for NASAL specimens only), is one component of a comprehensive MRSA colonization surveillance program. It is not intended to diagnose MRSA infection nor to guide or monitor treatment for MRSA infections. Test performance is not FDA approved in patients less than 76 years old. Performed at [facility], [address], [place], [place] [address]     Antimicrobials: Anti-infectives (From admission, onward)    Start     Dose/Rate Route Frequency Ordered Stop   11/01/21 1200  cefTRIAXone (ROCEPHIN) 2 g in sodium chloride 0.9 % 100 mL IVPB        2 g 200 mL/hr over 30 Minutes Intravenous Every 24 hours 11/01/21 0815 11/06/21 1159   11/01/21  1000  doxycycline (VIBRAMYCIN) 100 mg in sodium chloride 0.9 % 250 mL IVPB        100 mg 125 mL/hr over 120 Minutes Intravenous Every 12 hours 11/01/21 0815     11/01/21 0345  vancomycin (VANCOREADY) IVPB 1250 mg/250 mL        1,250 mg 166.7 mL/hr over 90 Minutes Intravenous  Once 11/01/21 0333 11/01/21 0646   11/01/21 0315  ceFEPIme (MAXIPIME) 2 g in  sodium chloride 0.9 % 100 mL IVPB        2 g 200 mL/hr over 30 Minutes Intravenous  Once 11/01/21 0308 11/01/21 0504   11/01/21 0315  metroNIDAZOLE (FLAGYL) IVPB 500 mg        500 mg 100 mL/hr over 60 Minutes Intravenous  Once 11/01/21 0308 11/01/21 0504   11/01/21 0315  vancomycin (VANCOCIN) IVPB 1000 mg/200 mL premix  Status:  Discontinued        1,000 mg 200 mL/hr over 60 Minutes Intravenous  Once 11/01/21 0308 11/01/21 0332      Culture/Microbiology    Component Value Date/Time   SDES URINE, CLEAN CATCH 11/01/2021 0446   SPECREQUEST NONE 11/01/2021 0446   CULT  11/01/2021 0446    NO GROWTH Performed at [facility], [place] [address], [place],  [address]    REPTSTATUS 11/02/2021 FINAL 11/01/2021 0446    Other culture-see note  Radiology Studies: No results found.   LOS: 2 days   Antonieta Pert, MD [facility]  11/03/2021, 2:32 PM

## 2021-11-04 DIAGNOSIS — J189 Pneumonia, unspecified organism: Secondary | ICD-10-CM | POA: Diagnosis not present

## 2021-11-04 DIAGNOSIS — A419 Sepsis, unspecified organism: Secondary | ICD-10-CM | POA: Diagnosis not present

## 2021-11-04 LAB — BASIC METABOLIC PANEL
Anion gap: 4 — ABNORMAL LOW (ref 5–15)
BUN: 10 mg/dL (ref 8–23)
CO2: 21 mmol/L — ABNORMAL LOW (ref 22–32)
Calcium: 8.4 mg/dL — ABNORMAL LOW (ref 8.9–10.3)
Chloride: 118 mmol/L — ABNORMAL HIGH (ref 98–111)
Creatinine, Ser: 1 mg/dL (ref 0.44–1.00)
GFR, Estimated: 58 mL/min — ABNORMAL LOW (ref >60)
Glucose, Bld: 94 mg/dL (ref 70–99)
Potassium: 3.7 mmol/L (ref 3.5–5.1)
Sodium: 143 mmol/L (ref 135–145)

## 2021-11-04 MED ORDER — DOXYCYCLINE MONOHYDRATE 100 MG PO TABS
100.0000 mg | ORAL_TABLET | Freq: Two times a day (BID) | ORAL | 0 refills | Status: DC
Start: 2021-11-04 — End: 2024-03-09

## 2021-11-04 MED ORDER — BUDESONIDE 0.5 MG/2ML IN SUSP: 0.5000 mg | Freq: Two times a day (BID) | RESPIRATORY_TRACT | 0 refills | Status: DC

## 2021-11-04 MED ORDER — GUAIFENESIN-DM 100-10 MG/5ML PO SYRP: 10.0000 mL | ORAL_SOLUTION | ORAL | 0 refills | Status: DC | PRN

## 2021-11-04 MED ORDER — CEFADROXIL 500 MG PO CAPS: 500.0000 mg | ORAL_CAPSULE | Freq: Two times a day (BID) | ORAL | 0 refills | Status: AC

## 2021-11-04 NOTE — TOC Transition Note (Signed)
Transition of Care [facility]) - CM/SW Discharge Note   Patient Details  Name: DAWSYN ZURN MRN: [id number] Date of Birth: 04/06/1946  Transition of Care [facility] CM/SW Contact:  Bess Kinds, RN Phone Number: [phone] 11/04/2021, 10:47 AM   Clinical Narrative:     Patient to transition home today. Spoke with spouse on his cell phone to discuss post acute transition. He is currently at bedside and will be transporting patient home. He will take her to her hospital follow up appointment with PCP. No questions about medications or medication concerns. Has home oxygen with [employer] at [facility] location for prn needs. No TOC needs identiified at this time.   Final next level of care: Home/Self Care Barriers to Discharge: No Barriers Identified   Patient Goals and CMS Choice        Discharge Placement                       Discharge Plan and Services                                     Social Determinants of Health (SDOH) Interventions     Readmission Risk Interventions     No data to display

## 2021-11-04 NOTE — Evaluation (Signed)
Occupational Therapy Evaluation Patient Details Name: Jenny Kelly MRN: [id number] DOB: 1945-06-17 [name]: 11/04/2021   History of Present Illness 76 yo F admitted 7/12 presenting w/ increased somnolence.  In the ED  felt to be in sepsis with source as pneumonia in R lung, labs showed AKI and leukocytosis. 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  Pt currently needing min guard-mod A for ADLs, mod A for bed mobility, and min guard for transfers using RW. Pt reporting increased fatigue and weakness compared to baseline, however spouse assists with all of pt's needs. Pt presenting with impairments listed below, will follow acutely. Recommend d/c home with family assistance.     Recommendations for follow up therapy are one component of a multi-disciplinary discharge planning process, led by the attending physician.  Recommendations may be updated based on patient status, additional functional criteria and insurance authorization.   Follow Up Recommendations  No OT follow up    Assistance Recommended at Discharge Intermittent Supervision/Assistance  Patient can return home with the following A little help with bathing/dressing/bathroom;A little help with walking and/or transfers;Assistance with cooking/housework;Direct supervision/assist for medications management;Direct supervision/assist for financial management;Assist for transportation;Help with stairs or ramp for entrance    Functional Status Assessment  Patient has had a recent decline in their functional status and demonstrates the ability to make significant improvements in  function in a reasonable and predictable amount of time.  Equipment Recommendations  None recommended by OT;Other (comment) (pt has all needed DME)    Recommendations for Other Services PT consult     Precautions / Restrictions Precautions Precautions: Fall Restrictions Weight Bearing Restrictions: No      Mobility Bed Mobility Overal bed mobility: Needs Assistance Bed Mobility: Supine to Sit, Sit to Supine     Supine to sit: Mod assist Sit to supine: Min guard        Transfers Overall transfer level: Needs assistance Equipment used: Rolling walker (2 wheels) Transfers: Sit to/from Stand Sit to Stand: Min guard                  Balance Overall balance assessment: Needs assistance Sitting-balance support: Feet supported, Bilateral upper extremity supported Sitting balance-Leahy Scale: Fair     Standing balance support: During functional activity, Reliant on assistive device for balance Standing balance-Leahy Scale: Poor Standing balance comment: reliant on external support                           ADL either performed or assessed with clinical judgement   ADL Overall ADL's : Needs assistance/impaired Eating/Feeding: Supervision/ safety;Sitting   Grooming: Supervision/safety;Sitting;Standing   Upper Body Bathing: Minimal assistance   Lower Body Bathing: Moderate assistance;Sitting/lateral leans   Upper Body Dressing : Minimal assistance;Standing;Sitting   Lower Body Dressing: Moderate assistance;Sitting/lateral leans;Sit to/from stand   Toilet Transfer: Nurse, adult (2 wheels)   Toileting- Architect and Hygiene: Min guard;Sitting/lateral lean;Sit to/from stand       Functional mobility during ADLs: Min guard;Rolling walker (2 wheels)       Vision   Vision Assessment?: No apparent visual deficits     Perception  Praxis      Pertinent Vitals/Pain Pain Assessment Pain Assessment: No/denies  pain Pain Intervention(s): Monitored during session     Hand Dominance Right   Extremity/Trunk Assessment Upper Extremity Assessment Upper Extremity Assessment: Generalized weakness;LUE deficits/detail LUE Deficits / Details: unable to flex shoulder past ~80*, decreased fine motor coordination, hx of weakness from prior CVA LUE Sensation: WNL LUE Coordination: decreased fine motor;decreased gross motor   Lower Extremity Assessment Lower Extremity Assessment: Defer to PT evaluation   Cervical / Trunk Assessment Cervical / Trunk Assessment: Kyphotic   Communication Communication Communication: HOH   Cognition Arousal/Alertness: Awake/alert Behavior During Therapy: WFL for tasks assessed/performed Overall Cognitive Status: Within Functional Limits for tasks assessed                                       General Comments  VSS on RA    Exercises     Shoulder Instructions      Home Living Family/patient expects to be discharged to:: Private residence Living Arrangements: Spouse/significant other Available Help at Discharge: Family;Available 24 hours/day Type of Home: House Home Access: Level entry     Home Layout: One level     Bathroom Shower/Tub: Tub/shower unit;Door   Bathroom Toilet: Handicapped height Bathroom Accessibility: Yes   Home Equipment: Agricultural consultant (2 wheels);BSC/3in1;Shower seat;Grab bars - tub/shower;Hand held shower head;Wheelchair Financial trader (4 wheels)          Prior Functioning/Environment Prior Level of Function : Needs assist             Mobility Comments: Had not been using RW at home but states she takes RW long distance ADLs Comments: spouse assist pt PRN, pt states she cant wipe buttocks well; spouse assists with getting in/out of shower,        OT Problem List: Decreased strength;Decreased range of motion;Decreased activity tolerance;Decreased coordination;Impaired balance (sitting and/or  standing);Decreased safety awareness      OT Treatment/Interventions: Self-care/ADL training;Therapeutic exercise;DME and/or AE instruction;Therapeutic activities;Patient/family education;Balance training;Cognitive remediation/compensation;Energy conservation    OT Goals(Current goals can be found in the care plan section) Acute Rehab OT Goals Patient Stated Goal: none stated OT Goal Formulation: With patient Time For Goal Achievement: 11/18/21 Potential to Achieve Goals: Good  OT Frequency: Min 2X/week    Co-evaluation              AM-PAC OT "6 Clicks" Daily Activity     Outcome Measure Help from another person eating meals?: None Help from another person taking care of personal grooming?: None Help from another person toileting, which includes using toliet, bedpan, or urinal?: A Little Help from another person bathing (including washing, rinsing, drying)?: A Lot Help from another person to put on and taking off regular upper body clothing?: A Little Help from another person to put on and taking off regular lower body clothing?: A Lot 6 Click Score: 18   End of Session Equipment Utilized During Treatment: Gait belt;Rolling walker (2 wheels) Nurse Communication: Mobility status  Activity Tolerance: Patient tolerated treatment well Patient left: in bed;with call bell/phone within reach;with bed alarm set;with family/visitor present  OT Visit Diagnosis: Unsteadiness on feet (R26.81);Other abnormalities of gait and mobility (R26.89);Muscle weakness (generalized) (M62.81)                Time: 8182-9937 OT Time Calculation (min): 17 min Charges:  OT General Charges $OT Visit: 1 Visit OT Evaluation $  OT Eval Low Complexity: 1 Low  Lynnda Child, OTD, OTR/L Acute Rehab [phone] - [place] 11/04/2021, 12:40 PM

## 2021-11-06 LAB — LEGIONELLA PNEUMOPHILA SEROGP 1 UR AG: L. pneumophila Serogp 1 Ur Ag: NEGATIVE

## 2021-11-06 LAB — CULTURE, BLOOD (ROUTINE X 2)
Culture: NO GROWTH
Culture: NO GROWTH
Special Requests: ADEQUATE
Special Requests: ADEQUATE

## 2021-11-16 DIAGNOSIS — J9611 Chronic respiratory failure with hypoxia: Secondary | ICD-10-CM | POA: Diagnosis not present

## 2021-11-16 DIAGNOSIS — J189 Pneumonia, unspecified organism: Secondary | ICD-10-CM | POA: Diagnosis not present

## 2021-11-16 DIAGNOSIS — M109 Gout, unspecified: Secondary | ICD-10-CM | POA: Diagnosis not present

## 2021-12-04 DIAGNOSIS — J189 Pneumonia, unspecified organism: Secondary | ICD-10-CM | POA: Diagnosis not present

## 2021-12-04 DIAGNOSIS — M109 Gout, unspecified: Secondary | ICD-10-CM | POA: Diagnosis not present

## 2021-12-04 DIAGNOSIS — J9611 Chronic respiratory failure with hypoxia: Secondary | ICD-10-CM | POA: Diagnosis not present

## 2021-12-12 DIAGNOSIS — E1121 Type 2 diabetes mellitus with diabetic nephropathy: Secondary | ICD-10-CM | POA: Diagnosis not present

## 2021-12-12 DIAGNOSIS — N1832 Chronic kidney disease, stage 3b: Secondary | ICD-10-CM | POA: Diagnosis not present

## 2021-12-12 DIAGNOSIS — Z789 Other specified health status: Secondary | ICD-10-CM | POA: Diagnosis not present

## 2021-12-12 DIAGNOSIS — E7800 Pure hypercholesterolemia, unspecified: Secondary | ICD-10-CM | POA: Diagnosis not present

## 2021-12-22 ENCOUNTER — Other Ambulatory Visit: Payer: Self-pay

## 2021-12-22 ENCOUNTER — Emergency Department (HOSPITAL_COMMUNITY): Payer: Medicare HMO

## 2021-12-22 ENCOUNTER — Inpatient Hospital Stay (HOSPITAL_COMMUNITY)
Admission: EM | Admit: 2021-12-22 | Discharge: 2022-01-04 | DRG: 103 | Disposition: A | Discharge status: Home Health | Payer: Medicare HMO | Attending: Family Medicine | Admitting: Family Medicine

## 2021-12-22 DIAGNOSIS — G43909 Migraine, unspecified, not intractable, without status migrainosus: Secondary | ICD-10-CM | POA: Diagnosis present

## 2021-12-22 DIAGNOSIS — Z8673 Personal history of transient ischemic attack (TIA), and cerebral infarction without residual deficits: Secondary | ICD-10-CM

## 2021-12-22 DIAGNOSIS — R569 Unspecified convulsions: Secondary | ICD-10-CM | POA: Diagnosis not present

## 2021-12-22 DIAGNOSIS — I69351 Hemiplegia and hemiparesis following cerebral infarction affecting right dominant side: Secondary | ICD-10-CM

## 2021-12-22 DIAGNOSIS — E1122 Type 2 diabetes mellitus with diabetic chronic kidney disease: Secondary | ICD-10-CM | POA: Diagnosis present

## 2021-12-22 DIAGNOSIS — N1831 Chronic kidney disease, stage 3a: Secondary | ICD-10-CM | POA: Diagnosis present

## 2021-12-22 DIAGNOSIS — Z66 Do not resuscitate: Secondary | ICD-10-CM | POA: Diagnosis present

## 2021-12-22 DIAGNOSIS — F0390 Unspecified dementia without behavioral disturbance: Secondary | ICD-10-CM | POA: Diagnosis present

## 2021-12-22 DIAGNOSIS — G9600 Cerebrospinal fluid leak, unspecified: Secondary | ICD-10-CM | POA: Diagnosis not present

## 2021-12-22 DIAGNOSIS — I491 Atrial premature depolarization: Secondary | ICD-10-CM | POA: Diagnosis not present

## 2021-12-22 DIAGNOSIS — Z885 Allergy status to narcotic agent status: Secondary | ICD-10-CM

## 2021-12-22 DIAGNOSIS — J8410 Pulmonary fibrosis, unspecified: Secondary | ICD-10-CM | POA: Diagnosis present

## 2021-12-22 DIAGNOSIS — G4733 Obstructive sleep apnea (adult) (pediatric): Secondary | ICD-10-CM | POA: Diagnosis present

## 2021-12-22 DIAGNOSIS — Z881 Allergy status to other antibiotic agents status: Secondary | ICD-10-CM | POA: Diagnosis not present

## 2021-12-22 DIAGNOSIS — M2578 Osteophyte, vertebrae: Secondary | ICD-10-CM | POA: Diagnosis not present

## 2021-12-22 DIAGNOSIS — K219 Gastro-esophageal reflux disease without esophagitis: Secondary | ICD-10-CM | POA: Diagnosis present

## 2021-12-22 DIAGNOSIS — I252 Old myocardial infarction: Secondary | ICD-10-CM

## 2021-12-22 DIAGNOSIS — G4489 Other headache syndrome: Secondary | ICD-10-CM | POA: Diagnosis not present

## 2021-12-22 DIAGNOSIS — R4182 Altered mental status, unspecified: Secondary | ICD-10-CM | POA: Diagnosis not present

## 2021-12-22 DIAGNOSIS — I959 Hypotension, unspecified: Secondary | ICD-10-CM | POA: Diagnosis not present

## 2021-12-22 DIAGNOSIS — J449 Chronic obstructive pulmonary disease, unspecified: Secondary | ICD-10-CM | POA: Diagnosis not present

## 2021-12-22 DIAGNOSIS — M316 Other giant cell arteritis: Secondary | ICD-10-CM | POA: Diagnosis present

## 2021-12-22 DIAGNOSIS — E039 Hypothyroidism, unspecified: Secondary | ICD-10-CM | POA: Diagnosis present

## 2021-12-22 DIAGNOSIS — Z7982 Long term (current) use of aspirin: Secondary | ICD-10-CM

## 2021-12-22 DIAGNOSIS — M47816 Spondylosis without myelopathy or radiculopathy, lumbar region: Secondary | ICD-10-CM | POA: Diagnosis not present

## 2021-12-22 DIAGNOSIS — Z7951 Long term (current) use of inhaled steroids: Secondary | ICD-10-CM

## 2021-12-22 DIAGNOSIS — G9389 Other specified disorders of brain: Secondary | ICD-10-CM | POA: Diagnosis not present

## 2021-12-22 DIAGNOSIS — Z96653 Presence of artificial knee joint, bilateral: Secondary | ICD-10-CM | POA: Diagnosis present

## 2021-12-22 DIAGNOSIS — Z888 Allergy status to other drugs, medicaments and biological substances status: Secondary | ICD-10-CM

## 2021-12-22 DIAGNOSIS — Z882 Allergy status to sulfonamides status: Secondary | ICD-10-CM | POA: Diagnosis not present

## 2021-12-22 DIAGNOSIS — Z8616 Personal history of COVID-19: Secondary | ICD-10-CM | POA: Diagnosis not present

## 2021-12-22 DIAGNOSIS — M4314 Spondylolisthesis, thoracic region: Secondary | ICD-10-CM | POA: Diagnosis not present

## 2021-12-22 DIAGNOSIS — Z87891 Personal history of nicotine dependence: Secondary | ICD-10-CM

## 2021-12-22 DIAGNOSIS — Z8249 Family history of ischemic heart disease and other diseases of the circulatory system: Secondary | ICD-10-CM

## 2021-12-22 DIAGNOSIS — J440 Chronic obstructive pulmonary disease with acute lower respiratory infection: Secondary | ICD-10-CM | POA: Diagnosis not present

## 2021-12-22 DIAGNOSIS — I6930 Unspecified sequelae of cerebral infarction: Secondary | ICD-10-CM

## 2021-12-22 DIAGNOSIS — I5032 Chronic diastolic (congestive) heart failure: Secondary | ICD-10-CM | POA: Diagnosis present

## 2021-12-22 DIAGNOSIS — J9611 Chronic respiratory failure with hypoxia: Secondary | ICD-10-CM | POA: Diagnosis present

## 2021-12-22 DIAGNOSIS — Z79899 Other long term (current) drug therapy: Secondary | ICD-10-CM

## 2021-12-22 DIAGNOSIS — R519 Headache, unspecified: Secondary | ICD-10-CM

## 2021-12-22 DIAGNOSIS — Z91040 Latex allergy status: Secondary | ICD-10-CM

## 2021-12-22 DIAGNOSIS — R2990 Unspecified symptoms and signs involving the nervous system: Secondary | ICD-10-CM | POA: Diagnosis not present

## 2021-12-22 DIAGNOSIS — E785 Hyperlipidemia, unspecified: Secondary | ICD-10-CM | POA: Diagnosis present

## 2021-12-22 DIAGNOSIS — J209 Acute bronchitis, unspecified: Secondary | ICD-10-CM | POA: Diagnosis not present

## 2021-12-22 DIAGNOSIS — G40909 Epilepsy, unspecified, not intractable, without status epilepticus: Secondary | ICD-10-CM | POA: Diagnosis present

## 2021-12-22 DIAGNOSIS — M5021 Other cervical disc displacement,  high cervical region: Secondary | ICD-10-CM | POA: Diagnosis not present

## 2021-12-22 DIAGNOSIS — I5030 Unspecified diastolic (congestive) heart failure: Secondary | ICD-10-CM | POA: Diagnosis present

## 2021-12-22 DIAGNOSIS — M5124 Other intervertebral disc displacement, thoracic region: Secondary | ICD-10-CM | POA: Diagnosis not present

## 2021-12-22 DIAGNOSIS — G96191 Perineural cyst: Secondary | ICD-10-CM | POA: Diagnosis not present

## 2021-12-22 DIAGNOSIS — R51 Headache with orthostatic component, not elsewhere classified: Secondary | ICD-10-CM | POA: Diagnosis not present

## 2021-12-22 DIAGNOSIS — Z20822 Contact with and (suspected) exposure to covid-19: Secondary | ICD-10-CM | POA: Diagnosis present

## 2021-12-22 DIAGNOSIS — W19XXXA Unspecified fall, initial encounter: Secondary | ICD-10-CM | POA: Diagnosis not present

## 2021-12-22 DIAGNOSIS — M5126 Other intervertebral disc displacement, lumbar region: Secondary | ICD-10-CM | POA: Diagnosis not present

## 2021-12-22 DIAGNOSIS — I480 Paroxysmal atrial fibrillation: Secondary | ICD-10-CM | POA: Diagnosis present

## 2021-12-22 DIAGNOSIS — N183 Chronic kidney disease, stage 3 unspecified: Secondary | ICD-10-CM | POA: Diagnosis not present

## 2021-12-22 DIAGNOSIS — Z79890 Hormone replacement therapy (postmenopausal): Secondary | ICD-10-CM

## 2021-12-22 DIAGNOSIS — G971 Other reaction to spinal and lumbar puncture: Secondary | ICD-10-CM | POA: Diagnosis not present

## 2021-12-22 DIAGNOSIS — Z7901 Long term (current) use of anticoagulants: Secondary | ICD-10-CM

## 2021-12-22 DIAGNOSIS — Z951 Presence of aortocoronary bypass graft: Secondary | ICD-10-CM

## 2021-12-22 HISTORY — DX: Tachycardia, unspecified: R00.0

## 2021-12-22 LAB — COMPREHENSIVE METABOLIC PANEL
ALT: 9 U/L (ref 0–44)
AST: 25 U/L (ref 15–41)
Albumin: 2.7 g/dL — ABNORMAL LOW (ref 3.5–5.0)
Alkaline Phosphatase: 81 U/L (ref 38–126)
Anion gap: 14 (ref 5–15)
BUN: 28 mg/dL — ABNORMAL HIGH (ref 8–23)
CO2: 22 mmol/L (ref 22–32)
Calcium: 9.3 mg/dL (ref 8.9–10.3)
Chloride: 107 mmol/L (ref 98–111)
Creatinine, Ser: 1.5 mg/dL — ABNORMAL HIGH (ref 0.44–1.00)
GFR, Estimated: 36 mL/min — ABNORMAL LOW (ref >60)
Glucose, Bld: 127 mg/dL — ABNORMAL HIGH (ref 70–99)
Potassium: 4.2 mmol/L (ref 3.5–5.1)
Sodium: 143 mmol/L (ref 135–145)
Total Bilirubin: 0.5 mg/dL (ref 0.3–1.2)
Total Protein: 6.9 g/dL (ref 6.5–8.1)

## 2021-12-22 LAB — CBC
HCT: 41.2 % (ref 36.0–46.0)
Hemoglobin: 13.1 g/dL (ref 12.0–15.0)
MCH: 28.5 pg (ref 26.0–34.0)
MCHC: 31.8 g/dL (ref 30.0–36.0)
MCV: 89.8 fL (ref 80.0–100.0)
Platelets: 213 10*3/uL (ref 150–400)
RBC: 4.59 MIL/uL (ref 3.87–5.11)
RDW: 16.9 % — ABNORMAL HIGH (ref 11.5–15.5)
WBC: 15.7 10*3/uL — ABNORMAL HIGH (ref 4.0–10.5)
nRBC: 0 % (ref 0.0–0.2)

## 2021-12-22 LAB — DIFFERENTIAL
Abs Immature Granulocytes: 0.06 10*3/uL (ref 0.00–0.07)
Basophils Absolute: 0.1 10*3/uL (ref 0.0–0.1)
Basophils Relative: 0 %
Eosinophils Absolute: 0.2 10*3/uL (ref 0.0–0.5)
Eosinophils Relative: 1 %
Immature Granulocytes: 0 %
Lymphocytes Relative: 17 %
Lymphs Abs: 2.7 10*3/uL (ref 0.7–4.0)
Monocytes Absolute: 1.2 10*3/uL — ABNORMAL HIGH (ref 0.1–1.0)
Monocytes Relative: 8 %
Neutro Abs: 11.5 10*3/uL — ABNORMAL HIGH (ref 1.7–7.7)
Neutrophils Relative %: 74 %

## 2021-12-22 LAB — I-STAT CHEM 8, ED
BUN: 28 mg/dL — ABNORMAL HIGH (ref 8–23)
Calcium, Ion: 1.19 mmol/L (ref 1.15–1.40)
Chloride: 110 mmol/L (ref 98–111)
Creatinine, Ser: 1.5 mg/dL — ABNORMAL HIGH (ref 0.44–1.00)
Glucose, Bld: 124 mg/dL — ABNORMAL HIGH (ref 70–99)
HCT: 41 % (ref 36.0–46.0)
Hemoglobin: 13.9 g/dL (ref 12.0–15.0)
Potassium: 4.1 mmol/L (ref 3.5–5.1)
Sodium: 143 mmol/L (ref 135–145)
TCO2: 22 mmol/L (ref 22–32)

## 2021-12-22 LAB — ETHANOL: Alcohol, Ethyl (B): <10 mg/dL (ref <10)

## 2021-12-22 LAB — RESP PANEL BY RT-PCR (FLU A&B, COVID) ARPGX2
Influenza A by PCR: NEGATIVE
Influenza B by PCR: NEGATIVE
SARS Coronavirus 2 by RT PCR: NEGATIVE

## 2021-12-22 NOTE — ED Provider Notes (Signed)
[facility] EMERGENCY DEPARTMENT Provider Note   CSN: [id number] Arrival date & time: 12/22/21  2054     History  Chief Complaint  Patient presents with   Weakness   Headache    Jenny Kelly is a 76 y.o. female.  Brought in by EMS for bad headache.  History given by EMS, the patient, her husband.  Patient has a history of previous stroke, chronic cough and pulmonary fibrosis.  She is on Xarelto for previous stroke.  Patient states that around 5:00 she had sudden onset of sharp headache which is bitemporal radiating across the frontal part of her head.  Her husband reports that he gave her a Percocet around 5:30 PM.  She states that this significantly helped her headache.  However husband states that around 6 she was gray, diaphoretic and appeared to be drooling out of her mouth.  He immediately called 911 because he was concerned for a stroke.  EMS reports that the patient did not have any objective neurodeficits on their assessment so they did not call a code stroke.  Patient denies fevers chills or chest pain   Weakness Associated symptoms: headaches   Headache Associated symptoms: weakness        Home Medications Prior to Admission medications   Medication Sig Start Date End Date Taking? Authorizing Provider  acetaminophen (TYLENOL) 500 MG tablet Take 500 mg by mouth every 6 (six) hours as needed for mild pain or moderate pain.    [provider]  aspirin 81 MG EC tablet Take 1 tablet (81 mg total) by mouth daily. Swallow whole. 05/16/21   Love, Evlyn Kanner, PA-C  budesonide (PULMICORT) 0.5 MG/2ML nebulizer solution Take 2 mLs (0.5 mg total) by nebulization in the morning and at bedtime. 11/04/21 12/04/21  Lanae Boast, MD  cetirizine (ZYRTEC) 10 MG tablet Take 10 mg by mouth daily.    [provider]  donepezil (ARICEPT) 10 MG tablet Take 1 tablet (10 mg total) by mouth at bedtime. 05/16/21   Love, Evlyn Kanner, PA-C  guaiFENesin-dextromethorphan  (ROBITUSSIN DM) 100-10 MG/5ML syrup Take 10 mLs by mouth every 4 (four) hours as needed for cough. 11/04/21   Lanae Boast, MD  HYDROcodone-acetaminophen (NORCO/VICODIN) 5-325 MG tablet Take 1 tablet by mouth every 6 (six) hours as needed for moderate pain. 06/30/21   [provider]  levothyroxine (SYNTHROID) 88 MCG tablet Take 88 mcg by mouth daily before breakfast.    [provider]  montelukast (SINGULAIR) 10 MG tablet Take 1 tablet (10 mg total) by mouth at bedtime. 05/16/21   Love, Evlyn Kanner, PA-C  Multiple Vitamin (MULTIVITAMIN WITH MINERALS) TABS tablet Take 1 tablet by mouth daily. 05/17/21   Love, Evlyn Kanner, PA-C  Omega-3 Fatty Acids (FISH OIL) 1200 MG CAPS Take 1,200 mg by mouth 2 (two) times daily.    [provider]  omeprazole (PRILOSEC) 40 MG capsule Take 1 capsule (40 mg total) by mouth daily. Patient taking differently: Take 40 mg by mouth 2 (two) times daily. 05/16/21   Love, Evlyn Kanner, PA-C  oxybutynin (DITROPAN) 5 MG tablet Take 0.5 tablets (2.5 mg total) by mouth 3 (three) times daily. Patient not taking: Reported on 11/01/2021 07/07/21   Lonia Blood, MD  PERCOCET 5-325 MG tablet Take 1 tablet by mouth every 4 (four) hours as needed for severe pain. 07/27/21   [provider]  pravastatin (PRAVACHOL) 80 MG tablet Take 1 tablet (80 mg total) by mouth every evening. Patient  not taking: Reported on 11/01/2021 09/15/21 12/14/21  Park Liter, MD  pregabalin (LYRICA) 50 MG capsule TAKE 1 CAPSULE BY MOUTH 2 TIMES DAILY. 06/30/21   Raulkar, Clide Deutscher, MD  Probiotic Product (PROBIOTIC PO) Take 15 Billion Cells by mouth daily.    [provider]  Rivaroxaban (XARELTO) 15 MG TABS tablet Take 1 tablet (15 mg total) by mouth daily. 09/15/21   Park Liter, MD  topiramate (TOPAMAX) 200 MG tablet Take 1 tablet (200 mg total) by mouth 2 (two) times daily. 05/16/21   Love, Ivan Anchors, PA-C  URINARY HEALTH/CRANBERRY PO Take 1 tablet by mouth 2 (two)  times daily.    [provider]      Allergies    Ciprofloxacin, Prochlorperazine edisylate, Morphine, Prednisone, Reduced iso-alpha acids complex, Statins, Sulfa antibiotics, Azithromycin, and Latex    Review of Systems   Review of Systems  Neurological:  Positive for weakness and headaches.    Physical Exam Updated Vital Signs BP 128/60 (BP Location: Right Arm)   Pulse 84   Temp 98 F (36.7 C) (Oral)   Resp 18   SpO2 94%  Physical Exam Vitals and nursing note reviewed.  Constitutional:      General: She is not in acute distress.    Appearance: She is well-developed. She is not diaphoretic.  HENT:     Head: Normocephalic and atraumatic.  Eyes:     General: No scleral icterus.    Conjunctiva/sclera: Conjunctivae normal.     Pupils: Pupils are equal, round, and reactive to light.     Comments: No horizontal, vertical or rotational nystagmus  Neck:     Comments: Full active and passive ROM without pain No midline or paraspinal tenderness No nuchal rigidity or meningeal signs Cardiovascular:     Rate and Rhythm: Normal rate and regular rhythm.  Pulmonary:     Effort: Pulmonary effort is normal. No respiratory distress.     Breath sounds: Normal breath sounds. No wheezing or rales.  Abdominal:     General: Bowel sounds are normal.     Palpations: Abdomen is soft.     Tenderness: There is no abdominal tenderness. There is no guarding or rebound.  Musculoskeletal:        General: Normal range of motion.     Cervical back: Normal range of motion and neck supple.  Lymphadenopathy:     Cervical: No cervical adenopathy.  Skin:    General: Skin is warm and dry.     Findings: No rash.  Neurological:     Mental Status: She is alert and oriented to person, place, and time.     Cranial Nerves: No cranial nerve deficit.     Motor: No abnormal muscle tone.     Coordination: Coordination normal.     Deep Tendon Reflexes: Reflexes are normal and symmetric.      Comments: Mental Status:  Alert, oriented, thought content appropriate. Speech fluent without evidence of aphasia. Able to follow 2 step commands without difficulty.  Cranial Nerves:  II:  Peripheral visual fields grossly normal, pupils equal, round, reactive to light III,IV, VI: ptosis not present, extra-ocular motions intact bilaterally  V,VII: smile symmetric, facial light touch sensation equal VIII: hearing grossly normal bilaterally  IX,X: midline uvula rise  XI: bilateral shoulder shrug equal and strong XII: midline tongue extension  Motor:  5/5 in upper and lower right extremities 4/5 strength in the upper and lower left extremities Sensory: Pinprick and light touch normal  in all extremities.  Deep Tendon Reflexes: 2+ and symmetric  Cerebellar: normal finger-to-nose with bilateral upper extremities Gait: normal gait and balance CV: distal pulses palpable throughout   Psychiatric:        Behavior: Behavior normal.        Thought Content: Thought content normal.        Judgment: Judgment normal.     ED Results / Procedures / Treatments   Labs (all labs ordered are listed, but only abnormal results are displayed) Labs Reviewed  RESP PANEL BY RT-PCR (FLU A&B, COVID) ARPGX2  ETHANOL  CBC  DIFFERENTIAL  COMPREHENSIVE METABOLIC PANEL  RAPID URINE DRUG SCREEN, HOSP PERFORMED  URINALYSIS, ROUTINE W REFLEX MICROSCOPIC  I-STAT CHEM 8, ED    EKG None  Radiology No results found.  Procedures Procedures    Medications Ordered in ED Medications - No data to display  ED Course/ Medical Decision Making/ A&P Clinical Course as of 12/23/21 0021  Sat Dec 23, 2021  0018 Troponin I (High Sensitivity) [AH]  0020 Comprehensive metabolic panel(!) [AH]  0020 CBC(!) [AH]  0020 Resp Panel by RT-PCR (Flu A&B, Covid) Anterior Nasal Swab [AH]  0020 CT HEAD WO CONTRAST [AH]  0020 DG Chest 1 View [AH]  0020 Patient [AH]    Clinical Course User Index [AH] Arthor Captain, PA-C                            Medical Decision Making Patient here with headache and brief episode of drooling.  On my evaluation she has a normal neurologic exam no facial droop.  I reviewed the patient's labs which are fairly unconcerning.  She does have some mildly elevated creatinine which may represent some dehydration.  The remainder of her labs are otherwise reassuring.  I have visualized and interpreted chest x-ray and CT head both of which showed no acute abnormalities.  I have ordered an MRI to rule out stroke as a cause of her symptoms earlier.  Patient signout given to PA Sanders at shift change.  Amount and/or Complexity of Data Reviewed Labs: ordered. Radiology: ordered.          Final Clinical Impression(s) / ED Diagnoses Final diagnoses:  None    Rx / DC Orders ED Discharge Orders     None         Arthor Captain, PA-C 12/23/21 0024    Franne Forts, DO 12/29/21 0010

## 2021-12-22 NOTE — ED Provider Notes (Incomplete)
[facility] EMERGENCY DEPARTMENT Provider Note   CSN: [id number] Arrival date & time: 12/22/21  2054     History {Add pertinent medical, surgical, social history, OB history to HPI:1} Chief Complaint  Patient presents with  . Weakness  . Headache    Jenny Kelly is a 76 y.o. female.  Brought in by EMS for bad headache.  History given by EMS, the patient, her husband.  Patient has a history of previous stroke, chronic cough and pulmonary fibrosis.  She is on Xarelto for previous stroke.  Patient states that around 5:00 she had sudden onset of sharp headache which is bitemporal radiating across the frontal part of her head.  Her husband reports that he gave her a Percocet around 5:30 PM.  She states that this significantly helped her headache.  However husband states that around 6 she was gray, diaphoretic and appeared to be drooling out of her mouth.  He immediately called 911 because he was concerned for a stroke.  EMS reports that the patient did not have any objective neurodeficits on their assessment so they did not call a code stroke.  Patient denies fevers chills or chest pain   Weakness Associated symptoms: headaches   Headache Associated symptoms: weakness        Home Medications Prior to Admission medications   Medication Sig Start Date End Date Taking? Authorizing Provider  acetaminophen (TYLENOL) 500 MG tablet Take 500 mg by mouth every 6 (six) hours as needed for mild pain or moderate pain.    [provider]  aspirin 81 MG EC tablet Take 1 tablet (81 mg total) by mouth daily. Swallow whole. 05/16/21   Love, Evlyn Kanner, PA-C  budesonide (PULMICORT) 0.5 MG/2ML nebulizer solution Take 2 mLs (0.5 mg total) by nebulization in the morning and at bedtime. 11/04/21 12/04/21  Lanae Boast, MD  cetirizine (ZYRTEC) 10 MG tablet Take 10 mg by mouth daily.    [provider]  donepezil (ARICEPT) 10 MG tablet Take 1 tablet (10 mg total) by mouth at  bedtime. 05/16/21   Love, Evlyn Kanner, PA-C  guaiFENesin-dextromethorphan (ROBITUSSIN DM) 100-10 MG/5ML syrup Take 10 mLs by mouth every 4 (four) hours as needed for cough. 11/04/21   Lanae Boast, MD  HYDROcodone-acetaminophen (NORCO/VICODIN) 5-325 MG tablet Take 1 tablet by mouth every 6 (six) hours as needed for moderate pain. 06/30/21   [provider]  levothyroxine (SYNTHROID) 88 MCG tablet Take 88 mcg by mouth daily before breakfast.    [provider]  montelukast (SINGULAIR) 10 MG tablet Take 1 tablet (10 mg total) by mouth at bedtime. 05/16/21   Love, Evlyn Kanner, PA-C  Multiple Vitamin (MULTIVITAMIN WITH MINERALS) TABS tablet Take 1 tablet by mouth daily. 05/17/21   Love, Evlyn Kanner, PA-C  Omega-3 Fatty Acids (FISH OIL) 1200 MG CAPS Take 1,200 mg by mouth 2 (two) times daily.    [provider]  omeprazole (PRILOSEC) 40 MG capsule Take 1 capsule (40 mg total) by mouth daily. Patient taking differently: Take 40 mg by mouth 2 (two) times daily. 05/16/21   Love, Evlyn Kanner, PA-C  oxybutynin (DITROPAN) 5 MG tablet Take 0.5 tablets (2.5 mg total) by mouth 3 (three) times daily. Patient not taking: Reported on 11/01/2021 07/07/21   Lonia Blood, MD  PERCOCET 5-325 MG tablet Take 1 tablet by mouth every 4 (four) hours as needed for severe pain. 07/27/21   [provider]  pravastatin (PRAVACHOL) 80 MG tablet Take 1  tablet (80 mg total) by mouth every evening. Patient not taking: Reported on 11/01/2021 09/15/21 12/14/21  Georgeanna Lea, MD  pregabalin (LYRICA) 50 MG capsule TAKE 1 CAPSULE BY MOUTH 2 TIMES DAILY. 06/30/21   Raulkar, Drema Pry, MD  Probiotic Product (PROBIOTIC PO) Take 15 Billion Cells by mouth daily.    [provider]  Rivaroxaban (XARELTO) 15 MG TABS tablet Take 1 tablet (15 mg total) by mouth daily. 09/15/21   Georgeanna Lea, MD  topiramate (TOPAMAX) 200 MG tablet Take 1 tablet (200 mg total) by mouth 2 (two) times daily. 05/16/21   Love, Evlyn Kanner, PA-C  URINARY HEALTH/CRANBERRY PO Take 1 tablet by mouth 2 (two) times daily.    [provider]      Allergies    Ciprofloxacin, Prochlorperazine edisylate, Morphine, Prednisone, Reduced iso-alpha acids complex, Statins, Sulfa antibiotics, Azithromycin, and Latex    Review of Systems   Review of Systems  Neurological:  Positive for weakness and headaches.    Physical Exam Updated Vital Signs BP 128/60 (BP Location: Right Arm)   Pulse 84   Temp 98 F (36.7 C) (Oral)   Resp 18   SpO2 94%  Physical Exam Vitals and nursing note reviewed.  Constitutional:      General: She is not in acute distress.    Appearance: She is well-developed. She is not diaphoretic.  HENT:     Head: Normocephalic and atraumatic.  Eyes:     General: No scleral icterus.    Conjunctiva/sclera: Conjunctivae normal.     Pupils: Pupils are equal, round, and reactive to light.     Comments: No horizontal, vertical or rotational nystagmus  Neck:     Comments: Full active and passive ROM without pain No midline or paraspinal tenderness No nuchal rigidity or meningeal signs Cardiovascular:     Rate and Rhythm: Normal rate and regular rhythm.  Pulmonary:     Effort: Pulmonary effort is normal. No respiratory distress.     Breath sounds: Normal breath sounds. No wheezing or rales.  Abdominal:     General: Bowel sounds are normal.     Palpations: Abdomen is soft.     Tenderness: There is no abdominal tenderness. There is no guarding or rebound.  Musculoskeletal:        General: Normal range of motion.     Cervical back: Normal range of motion and neck supple.  Lymphadenopathy:     Cervical: No cervical adenopathy.  Skin:    General: Skin is warm and dry.     Findings: No rash.  Neurological:     Mental Status: She is alert and oriented to person, place, and time.     Cranial Nerves: No cranial nerve deficit.     Motor: No abnormal muscle tone.     Coordination: Coordination normal.      Deep Tendon Reflexes: Reflexes are normal and symmetric.     Comments: Mental Status:  Alert, oriented, thought content appropriate. Speech fluent without evidence of aphasia. Able to follow 2 step commands without difficulty.  Cranial Nerves:  II:  Peripheral visual fields grossly normal, pupils equal, round, reactive to light III,IV, VI: ptosis not present, extra-ocular motions intact bilaterally  V,VII: smile symmetric, facial light touch sensation equal VIII: hearing grossly normal bilaterally  IX,X: midline uvula rise  XI: bilateral shoulder shrug equal and strong XII: midline tongue extension  Motor:  5/5 in upper and lower right extremities 4/5 strength in the upper and  lower left extremities Sensory: Pinprick and light touch normal in all extremities.  Deep Tendon Reflexes: 2+ and symmetric  Cerebellar: normal finger-to-nose with bilateral upper extremities Gait: normal gait and balance CV: distal pulses palpable throughout   Psychiatric:        Behavior: Behavior normal.        Thought Content: Thought content normal.        Judgment: Judgment normal.     ED Results / Procedures / Treatments   Labs (all labs ordered are listed, but only abnormal results are displayed) Labs Reviewed  RESP PANEL BY RT-PCR (FLU A&B, COVID) ARPGX2  ETHANOL  CBC  DIFFERENTIAL  COMPREHENSIVE METABOLIC PANEL  RAPID URINE DRUG SCREEN, HOSP PERFORMED  URINALYSIS, ROUTINE W REFLEX MICROSCOPIC  I-STAT CHEM 8, ED    EKG None  Radiology No results found.  Procedures Procedures  {Document cardiac monitor, telemetry assessment procedure when appropriate:1}  Medications Ordered in ED Medications - No data to display  ED Course/ Medical Decision Making/ A&P                           Medical Decision Making Amount and/or Complexity of Data Reviewed Labs: ordered. Radiology: ordered.   ***  {Document critical care time when appropriate:1} {Document review of labs and clinical  decision tools ie heart score, Chads2Vasc2 etc:1}  {Document your independent review of radiology images, and any outside records:1} {Document your discussion with family members, caretakers, and with consultants:1} {Document social determinants of health affecting pt's care:1} {Document your decision making why or why not admission, treatments were needed:1} Final Clinical Impression(s) / ED Diagnoses Final diagnoses:  None    Rx / DC Orders ED Discharge Orders     None

## 2021-12-22 NOTE — ED Triage Notes (Signed)
Pt here via [facility] from home for HA and generalized weakness x2 days.Pt describes pain is across her forehead, 10/10. Pt denies dizziness, no unilateral weakness. Pt has hx of strokes, w/ L arm deficit. 120/60, 74HR, 95% on RA - per husband pt wears 1L O2 as needed, cbg 202

## 2021-12-23 ENCOUNTER — Emergency Department (HOSPITAL_COMMUNITY): Payer: Medicare HMO

## 2021-12-23 ENCOUNTER — Encounter (HOSPITAL_COMMUNITY): Payer: Self-pay | Admitting: Internal Medicine

## 2021-12-23 ENCOUNTER — Observation Stay (HOSPITAL_COMMUNITY): Payer: Medicare HMO

## 2021-12-23 DIAGNOSIS — J449 Chronic obstructive pulmonary disease, unspecified: Secondary | ICD-10-CM | POA: Diagnosis not present

## 2021-12-23 DIAGNOSIS — I6930 Unspecified sequelae of cerebral infarction: Secondary | ICD-10-CM | POA: Diagnosis not present

## 2021-12-23 DIAGNOSIS — G9389 Other specified disorders of brain: Secondary | ICD-10-CM | POA: Diagnosis not present

## 2021-12-23 DIAGNOSIS — J8410 Pulmonary fibrosis, unspecified: Secondary | ICD-10-CM | POA: Diagnosis not present

## 2021-12-23 DIAGNOSIS — M316 Other giant cell arteritis: Secondary | ICD-10-CM

## 2021-12-23 DIAGNOSIS — R519 Headache, unspecified: Secondary | ICD-10-CM | POA: Diagnosis not present

## 2021-12-23 DIAGNOSIS — I480 Paroxysmal atrial fibrillation: Secondary | ICD-10-CM | POA: Diagnosis not present

## 2021-12-23 DIAGNOSIS — I5032 Chronic diastolic (congestive) heart failure: Secondary | ICD-10-CM | POA: Diagnosis not present

## 2021-12-23 DIAGNOSIS — Z66 Do not resuscitate: Secondary | ICD-10-CM | POA: Diagnosis not present

## 2021-12-23 LAB — RAPID URINE DRUG SCREEN, HOSP PERFORMED
Amphetamines: NOT DETECTED
Barbiturates: NOT DETECTED
Benzodiazepines: NOT DETECTED
Cocaine: NOT DETECTED
Opiates: NOT DETECTED
Tetrahydrocannabinol: NOT DETECTED

## 2021-12-23 LAB — TROPONIN I (HIGH SENSITIVITY)
Troponin I (High Sensitivity): 8 ng/L (ref <18)
Troponin I (High Sensitivity): 9 ng/L (ref <18)

## 2021-12-23 LAB — URINALYSIS, ROUTINE W REFLEX MICROSCOPIC
Bilirubin Urine: NEGATIVE
Glucose, UA: NEGATIVE mg/dL
Hgb urine dipstick: NEGATIVE
Ketones, ur: NEGATIVE mg/dL
Nitrite: NEGATIVE
Protein, ur: NEGATIVE mg/dL
Specific Gravity, Urine: 1.015 (ref 1.005–1.030)
pH: 5 (ref 5.0–8.0)

## 2021-12-23 LAB — SEDIMENTATION RATE: Sed Rate: 57 mm/hr — ABNORMAL HIGH (ref 0–22)

## 2021-12-23 LAB — C-REACTIVE PROTEIN: CRP: 21.3 mg/dL — ABNORMAL HIGH (ref <1.0)

## 2021-12-23 MED ORDER — IPRATROPIUM-ALBUTEROL 0.5-2.5 (3) MG/3ML IN SOLN: 3.0000 mL | Freq: Four times a day (QID) | RESPIRATORY_TRACT | Status: DC | PRN

## 2021-12-23 MED ORDER — ORAL CARE MOUTH RINSE
15.0000 mL | OROMUCOSAL | Status: DC | PRN
Start: 2021-12-24 — End: 2022-01-04

## 2021-12-23 MED ORDER — OXYCODONE-ACETAMINOPHEN 5-325 MG PO TABS
1.0000 | ORAL_TABLET | Freq: Once | ORAL | Status: AC
  Administered 2021-12-23: 1 via ORAL
  Filled 2021-12-23: qty 1

## 2021-12-23 MED ORDER — OXYCODONE-ACETAMINOPHEN 5-325 MG PO TABS
1.0000 | ORAL_TABLET | ORAL | Status: DC | PRN
  Administered 2021-12-23 – 2021-12-25 (×6): 1 via ORAL
  Filled 2021-12-23 (×7): qty 1

## 2021-12-23 MED ORDER — METHYLPREDNISOLONE SODIUM SUCC 1000 MG IJ SOLR
1000.0000 mg | Freq: Once | INTRAMUSCULAR | Status: AC
Start: 2021-12-24 — End: 2021-12-24
  Administered 2021-12-24: 1000 mg via INTRAVENOUS
  Filled 2021-12-23: qty 16

## 2021-12-23 MED ORDER — ACETAMINOPHEN 325 MG PO TABS
650.0000 mg | ORAL_TABLET | Freq: Four times a day (QID) | ORAL | Status: DC | PRN
  Administered 2021-12-23 – 2021-12-30 (×4): 650 mg via ORAL
  Filled 2021-12-23 (×4): qty 2

## 2021-12-23 MED ORDER — LEVOTHYROXINE SODIUM 88 MCG PO TABS
88.0000 ug | ORAL_TABLET | Freq: Every day | ORAL | Status: DC
  Administered 2021-12-23 – 2022-01-04 (×12): 88 ug via ORAL
  Filled 2021-12-23 (×13): qty 1

## 2021-12-23 MED ORDER — RIVAROXABAN 15 MG PO TABS
15.0000 mg | ORAL_TABLET | Freq: Every day | ORAL | Status: DC
  Administered 2021-12-23 – 2021-12-24 (×2): 15 mg via ORAL
  Filled 2021-12-23 (×2): qty 1

## 2021-12-23 MED ORDER — MONTELUKAST SODIUM 10 MG PO TABS
10.0000 mg | ORAL_TABLET | Freq: Every day | ORAL | Status: DC
  Administered 2021-12-23 – 2022-01-03 (×11): 10 mg via ORAL
  Filled 2021-12-23 (×12): qty 1

## 2021-12-23 MED ORDER — ONDANSETRON HCL 4 MG/2ML IJ SOLN
4.0000 mg | Freq: Four times a day (QID) | INTRAMUSCULAR | Status: DC | PRN
Start: 2021-12-23 — End: 2021-12-31
  Administered 2021-12-30: 4 mg via INTRAVENOUS
  Filled 2021-12-23: qty 2

## 2021-12-23 MED ORDER — ACETAMINOPHEN 500 MG PO TABS
1000.0000 mg | ORAL_TABLET | Freq: Once | ORAL | Status: AC
  Administered 2021-12-23: 1000 mg via ORAL
  Filled 2021-12-23: qty 2

## 2021-12-23 MED ORDER — PANTOPRAZOLE SODIUM 40 MG PO TBEC
40.0000 mg | DELAYED_RELEASE_TABLET | Freq: Two times a day (BID) | ORAL | Status: DC
  Administered 2021-12-23 – 2022-01-04 (×25): 40 mg via ORAL
  Filled 2021-12-23 (×25): qty 1

## 2021-12-23 MED ORDER — ACETAMINOPHEN 650 MG RE SUPP: 650.0000 mg | Freq: Four times a day (QID) | RECTAL | Status: DC | PRN

## 2021-12-23 MED ORDER — SODIUM CHLORIDE 0.9 % IV SOLN
1000.0000 mg | Freq: Once | INTRAVENOUS | Status: AC
  Administered 2021-12-23: 1000 mg via INTRAVENOUS
  Filled 2021-12-23: qty 16

## 2021-12-23 MED ORDER — ASPIRIN 81 MG PO TBEC
81.0000 mg | DELAYED_RELEASE_TABLET | Freq: Every day | ORAL | Status: DC
  Administered 2021-12-23 – 2022-01-04 (×12): 81 mg via ORAL
  Filled 2021-12-23 (×13): qty 1

## 2021-12-23 MED ORDER — GUAIFENESIN-DM 100-10 MG/5ML PO SYRP: 10.0000 mL | ORAL_SOLUTION | ORAL | Status: DC | PRN

## 2021-12-23 MED ORDER — TOPIRAMATE 100 MG PO TABS
200.0000 mg | ORAL_TABLET | Freq: Two times a day (BID) | ORAL | Status: DC
  Administered 2021-12-23 – 2022-01-04 (×25): 200 mg via ORAL
  Filled 2021-12-23 (×25): qty 2

## 2021-12-23 MED ORDER — METOCLOPRAMIDE HCL 5 MG/ML IJ SOLN
10.0000 mg | Freq: Once | INTRAMUSCULAR | Status: AC
  Administered 2021-12-23: 10 mg via INTRAVENOUS
  Filled 2021-12-23: qty 2

## 2021-12-23 MED ORDER — ONDANSETRON HCL 4 MG PO TABS
4.0000 mg | ORAL_TABLET | Freq: Four times a day (QID) | ORAL | Status: DC | PRN
  Administered 2022-01-01 – 2022-01-04 (×4): 4 mg via ORAL
  Filled 2021-12-23 (×4): qty 1

## 2021-12-23 MED ORDER — DONEPEZIL HCL 10 MG PO TABS
10.0000 mg | ORAL_TABLET | Freq: Every day | ORAL | Status: DC
  Administered 2021-12-23 – 2022-01-03 (×12): 10 mg via ORAL
  Filled 2021-12-23 (×12): qty 1

## 2021-12-23 MED ORDER — PREGABALIN 50 MG PO CAPS
50.0000 mg | ORAL_CAPSULE | Freq: Two times a day (BID) | ORAL | Status: DC
  Administered 2021-12-23 – 2022-01-04 (×25): 50 mg via ORAL
  Filled 2021-12-23 (×25): qty 1

## 2021-12-23 NOTE — Assessment & Plan Note (Signed)
Stable. Prn duonebs.

## 2021-12-23 NOTE — Progress Notes (Signed)
Subjective: Patient admitted this morning, see detailed H&P by Dr Bridgett Larsson 76 year old female with a history of atrial fibrillation on Xarelto, history of CVA with residual right-sided weakness, history of cluster headaches and migraine, CKD stage IIIa, COPD, pulmonary fibrosis, seizure disorder came to ED with bitemporal headache.  This was associated with photophobia.  No vision changes.  No nausea or vomiting.  Patient is on Topamax and Lyrica for history of seizures and headache. In the ED ESR and CRP were elevated.  Neurology was consulted.  MRI brain was negative, CT head was negative.  Vitals:   12/23/21 0827 12/23/21 1128  BP: (!) 109/48 (!) 99/54  Pulse: 77 72  Resp: 14 14  Temp: 98 F (36.7 C) 97.7 F (36.5 C)  SpO2: 95% 99%      A/P Bitemporal headache -Due to elevated ESR and CRP there was concern for temporal arteritis -Ultrasound of the temporal artery obtained which was negative for halo sign -Neurology following -Patient is currently on high-dose IV Solu-Medrol -May need temporal artery biopsy after 3 days of IV Solu-Medrol  History of CVA with residual deficit Chronic.   Chronic respiratory failure with hypoxia (HCC) Stable.   PAF (paroxysmal atrial fibrillation) (HCC) Remains on xarelto   Stage 3a chronic kidney disease (CKD) (HCC) Stable.   (HFpEF) heart failure with preserved ejection fraction (HCC) Stable. Euvolemic.   DNR (do not resuscitate) Stable.   COPD (chronic obstructive pulmonary disease) (HCC) Stable. Prn duonebs.   Pulmonary fibrosis (HCC) Chronic. Stable.    [name] [facility]

## 2021-12-23 NOTE — Consult Note (Signed)
NEUROLOGY CONSULTATION NOTE   Date of service: December 23, 2021 Patient Name: Jenny Kelly MRN:  [id number] DOB:  Sep 14, 1945 Reason for consult: "Elevated ESR and CRP, headache" Requesting Provider: No att. providers found _ _ _   _ __   _ __ _ _  __ __   _ __   __ _  History of Present Illness  Jenny Kelly is a 76 y.o. 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 Reports tenderness on palpation, describes the pain as sharp stabs intermittently. Only had a few stabs on Thursday, many more on Friday and by Friday afternoon was happening almost constantly. Reports that it hurts inbetween these episodes. Denies any vision loss or eye pain. No prior similar headache. No history of cluster headaches. This is very different than her migraines. No fever, weight loss, no muscle aching, no jaw or tongue claudication, does not feel fatigue, no muscle stiffness.  She took some percoet which took the edge off but it still hurt and the pain was back shortly.  Workup in the ED with MRI Brain which is negative. ESR and CRP are elevated. No fever.   ROS   Constitutional Denies weight loss, fever and chills.   HEENT Denies changes in vision and hearing.   Respiratory Denies SOB and cough.   CV Denies palpitations and CP   GI Denies abdominal pain, nausea, vomiting and diarrhea.   GU Denies dysuria and urinary frequency.   MSK Denies myalgia and joint pain.   Skin Denies rash and pruritus.   Neurological + headache but no syncope.   Psychiatric Denies recent changes in mood. Denies anxiety and depression.    Past History   Past Medical History:  Diagnosis Date   Anemia    Anxiety    Arthritis    Asthma    Atrial fibrillation ([place])    Blood transfusion without reported diagnosis     Cataract    CHF (congestive heart failure) ([place])    Chronic kidney disease    Clotting disorder (HCC)    COPD (chronic obstructive pulmonary disease) (HCC)    Dementia ([place])    Depression    Diabetes mellitus without complication (HCC)    Dyspnea    GERD (gastroesophageal reflux disease)    Headache    Heart murmur    History of kidney stones    Hyperlipidemia    Hypothyroidism    Myocardial infarction ([place])    Neuromuscular disorder ([place])    tremors   Pneumonia due to COVID-19 virus    Seizures (HCC)    Sleep apnea    Stroke ([place])    Thyroid disease    Past Surgical History:  Procedure Laterality Date   ABDOMINAL HYSTERECTOMY     APPENDECTOMY     bladder tack     CHOLECYSTECTOMY     CORONARY ARTERY BYPASS GRAFT     CYSTOSCOPY W/ URETERAL STENT PLACEMENT Left 05/01/2021   Procedure: CYSTOSCOPY WITH RETROGRADE PYELOGRAM/URETERAL STENT PLACEMENT;  Surgeon: Ceasar Mons, MD;  Location: [facility];  Service: Urology;  Laterality: Left;   CYSTOSCOPY/URETEROSCOPY/HOLMIUM LASER/STENT PLACEMENT Left 08/09/2021   Procedure: CYSTOSCOPY/URETEROSCOPY/HOLMIUM LASER/STENT EXCHANGE;  Surgeon: Ceasar Mons, MD;  Location: [facility];  Service: Urology;  Laterality: Left;   ESOPHAGOGASTRODUODENOSCOPY  ENDOSCOPY     EYE SURGERY     FRACTURE SURGERY Right    3rd finger   JOINT REPLACEMENT Bilateral    knees   TONSILLECTOMY AND ADENOIDECTOMY     TUBAL LIGATION     Family History  Problem Relation Age of Onset   Heart disease Mother    Heart attack Mother    Breast cancer Mother    Heart disease Sister    Social History   Socioeconomic History   Marital status: Married    Spouse name: Ulice Dash   Number of children: 3   Years of education: Not on file   Highest education level: Not on file  Occupational History   Not on file  Tobacco Use   Smoking status: Former    Packs/day: 0.50    Years: 30.00    Total pack years: 15.00    Types: Cigarettes    Quit date:  06/26/2000    Years since quitting: 21.5   Smokeless tobacco: Never  Vaping Use   Vaping Use: Former  Substance and Sexual Activity   Alcohol use: Not Currently   Drug use: Never   Sexual activity: Not on file  Other Topics Concern   Not on file  Social History Narrative   Not on file   Social Determinants of Health   Financial Resource Strain: Not on file  Food Insecurity: Not on file  Transportation Needs: Not on file  Physical Activity: Not on file  Stress: Not on file  Social Connections: Not on file   Allergies  Allergen Reactions   Ciprofloxacin Hives    Ask patient   Prochlorperazine Edisylate Other (See Comments)    Cant swallow Muscle cramping   Morphine Other (See Comments)    Confusion   Prednisone Other (See Comments)    unknown   Reduced Iso-Alpha Acids Complex Other (See Comments)    unknown   Statins Other (See Comments)    Muscle weakness    Sulfa Antibiotics Swelling   Azithromycin Rash   Latex Rash    Medications  (Not in a hospital admission)    Vitals   Vitals:   12/22/21 2103 12/23/21 0123  BP: 128/60 130/67  Pulse: 84 88  Resp: 18 16  Temp: 98 F (36.7 C) 98.3 F (36.8 C)  TempSrc: Oral Oral  SpO2: 94% 92%     There is no height or weight on file to calculate BMI.  Physical Exam   General: Laying comfortably in bed; in no acute distress.  HENT: Normal oropharynx and mucosa. Normal external appearance of ears and nose.  Neck: Supple, no pain or tenderness  CV: No JVD. No peripheral edema.  Pulmonary: Symmetric Chest rise. Normal respiratory effort.  Abdomen: Soft to touch, non-tender.  Ext: No cyanosis, edema, or deformity  Skin: No rash. Normal palpation of skin.   Musculoskeletal: Normal digits and nails by inspection. No clubbing.   Neurologic Examination  Mental status/Cognition: Alert, oriented to self, place, month and year, good attention.  Speech/language: Fluent, comprehension intact, object naming intact,  repetition intact.  Cranial nerves:   CN II Pupils equal and reactive to light, tunneling of vision noted which per patient is new.   CN III,IV,VI EOM intact, no gaze preference or deviation, no nystagmus    CN V normal sensation in V1, V2, and V3 segments bilaterally    CN VII no asymmetry, no nasolabial fold flattening    CN VIII normal hearing to speech  CN IX & X normal palatal elevation, no uvular deviation    CN XI 5/5 head turn and 5/5 shoulder shrug bilaterally    CN XII midline tongue protrusion    Motor:  Muscle bulk: poor, tone normal, pronator drift has LUE pronator drift. tremor none Mvmt Root Nerve  Muscle Right Left Comments  SA C5/6 Ax Deltoid 5 4   EF C5/6 Mc Biceps 5 4   EE C6/7/8 Rad Triceps 5 4   WF C6/7 Med FCR     WE C7/8 PIN ECU     F Ab C8/T1 U ADM/FDI 5 4   HF L1/2/3 Fem Illopsoas 5 4   KE L2/3/4 Fem Quad 5 4   DF L4/5 D Peron Tib Ant 5 4   PF S1/2 Tibial Grc/Sol 5 4    Sensation:  Light touch Intact BL   Pin prick    Temperature    Vibration   Proprioception    Coordination/Complex Motor:  - Finger to Nose are intact BL - Heel to shin unable to do wit LLE. - Rapid alternating movement are slowed on the left. - Gait: deferred for patient safety. Typically uses a walker at home.  Labs   CBC:  Recent Labs  Lab 12/22/21 2223 12/22/21 2228  WBC 15.7*  --   NEUTROABS 11.5*  --   HGB 13.1 13.9  HCT 41.2 41.0  MCV 89.8  --   PLT 213  --     Basic Metabolic Panel:  Lab Results  Component Value Date   NA 143 12/22/2021   K 4.1 12/22/2021   CO2 22 12/22/2021   GLUCOSE 124 (H) 12/22/2021   BUN 28 (H) 12/22/2021   CREATININE 1.50 (H) 12/22/2021   CALCIUM 9.3 12/22/2021   GFRNONAA 36 (L) 12/22/2021   Lipid Panel:  Lab Results  Component Value Date   [place] 98 05/03/2021   HgbA1c:  Lab Results  Component Value Date   HGBA1C 6.0 (H) 08/09/2021   Urine Drug Screen: No results found for: "LABOPIA", "COCAINSCRNUR", "LABBENZ",  "AMPHETMU", "THCU", "LABBARB"  Alcohol Level     Component Value Date/Time   ETH <10 12/22/2021 2223    CT Head without contrast(Personally reviewed): CTH was negative for a large hypodensity concerning for a large territory infarct or hyperdensity concerning for an ICH  MRI Brain(Personally reviewed): R MCA encephalomalacia consistent with hx of R MCA stroke. No other intracranial abnormalities.  Impression   Jenny Kelly is a 76 y.o. 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 Presentation is certainly not classic for GCA specially with no jaw or tonuge claudication, no fever, weight loss, very mild scalp tenderness with no beading of the temporal artery on physcial exam and no signs of Polymyalgia rheumatica. However, does have elevated ESR and CRP and despite not endorsing vision deficit on history, exam demonstrates tunneling of vision BL which on further questioning, she endorses is new over the last day or 2. She is also a poor historian with husband having to supplement history. Given the vision symptoms and the fact that vision loss can be quite rapid and permanent with GCA, i do think she will benefit from coming in to the hospital and getting further evaluation with Temporal artery ultrasound. At this time,  would favor treating with a single dose of IV solumedrol given visual symptoms and reevaluate after BL temporal artery [place].  Impression: Rule out Giant Cell arteritis Recommendations  - IV solumedrol 1G IV once. Protonix IV once. - ophthalmology consult for dilated eye exam. - BL Temporal artery [place] to evaluate for halo sign. - Can consider temporal artery biopsy if the [place] is non  revealing. ______________________________________________________________________   Thank you for the opportunity to take part in the care of this patient. If you have any further questions, please contact the neurology consultation attending.  Signed,  Brandon Pager Number [phone] _ _ _   _ __   _ __ _ _  __ __   _ __   __ _

## 2021-12-23 NOTE — ED Provider Notes (Signed)
Assumed care from PA Harris at shift change.  See prior notes for full H&P.  Briefly, 70 F here with headache that began around 5PM.  Bitemporal but also across forehead.  Improved after percocet at home but had transient altered episode and husband called EMS.  Had prior stroke with residual left sided deficits-- unchanged today.  Results for orders placed or performed during the hospital encounter of 12/22/21  Resp Panel by RT-PCR (Flu A&B, Covid) Anterior Nasal Swab   Specimen: Anterior Nasal Swab  Result Value Ref Range   SARS Coronavirus 2 by RT PCR NEGATIVE NEGATIVE   Influenza A by PCR NEGATIVE NEGATIVE   Influenza B by PCR NEGATIVE NEGATIVE  Ethanol  Result Value Ref Range   Alcohol, Ethyl (B) <10 <10 mg/dL  CBC  Result Value Ref Range   WBC 15.7 (H) 4.0 - 10.5 K/uL   RBC 4.59 3.87 - 5.11 MIL/uL   Hemoglobin 13.1 12.0 - 15.0 g/dL   HCT 41.2 36.0 - 46.0 %   MCV 89.8 80.0 - 100.0 fL   MCH 28.5 26.0 - 34.0 pg   MCHC 31.8 30.0 - 36.0 g/dL   RDW 16.9 (H) 11.5 - 15.5 %   Platelets 213 150 - 400 K/uL   nRBC 0.0 0.0 - 0.2 %  Differential  Result Value Ref Range   Neutrophils Relative % 74 %   Neutro Abs 11.5 (H) 1.7 - 7.7 K/uL   Lymphocytes Relative 17 %   Lymphs Abs 2.7 0.7 - 4.0 K/uL   Monocytes Relative 8 %   Monocytes Absolute 1.2 (H) 0.1 - 1.0 K/uL   Eosinophils Relative 1 %   Eosinophils Absolute 0.2 0.0 - 0.5 K/uL   Basophils Relative 0 %   Basophils Absolute 0.1 0.0 - 0.1 K/uL   Immature Granulocytes 0 %   Abs Immature Granulocytes 0.06 0.00 - 0.07 K/uL  Comprehensive metabolic panel  Result Value Ref Range   Sodium 143 135 - 145 mmol/L   Potassium 4.2 3.5 - 5.1 mmol/L   Chloride 107 98 - 111 mmol/L   CO2 22 22 - 32 mmol/L   Glucose, Bld 127 (H) 70 - 99 mg/dL   BUN 28 (H) 8 - 23 mg/dL   Creatinine, Ser 1.50 (H) 0.44 - 1.00 mg/dL   Calcium 9.3 8.9 - 10.3 mg/dL   Total Protein 6.9 6.5 - 8.1 g/dL   Albumin 2.7 (L) 3.5 - 5.0 g/dL   AST 25 15 - 41 U/L   ALT 9 0 -  44 U/L   Alkaline Phosphatase 81 38 - 126 U/L   Total Bilirubin 0.5 0.3 - 1.2 mg/dL   GFR, Estimated 36 (L) >60 mL/min   Anion gap 14 5 - 15  Urinalysis, Routine w reflex microscopic Urine, Clean Catch  Result Value Ref Range   Color, Urine YELLOW YELLOW   APPearance CLEAR CLEAR   Specific Gravity, Urine 1.015 1.005 - 1.030   pH 5.0 5.0 - 8.0   Glucose, UA NEGATIVE NEGATIVE mg/dL   Hgb urine dipstick NEGATIVE NEGATIVE   Bilirubin Urine NEGATIVE NEGATIVE   Ketones, ur NEGATIVE NEGATIVE mg/dL   Protein, ur NEGATIVE NEGATIVE mg/dL   Nitrite NEGATIVE NEGATIVE   Leukocytes,Ua SMALL (A) NEGATIVE   RBC / HPF 0-5 0 - 5 RBC/hpf   WBC, UA 0-5 0 - 5 WBC/hpf   Bacteria, UA RARE (A) NONE SEEN   Squamous Epithelial / LPF 0-5 0 - 5   Mucus PRESENT  Hyaline Casts, UA PRESENT   Sedimentation rate  Result Value Ref Range   Sed Rate 57 (H) 0 - 22 mm/hr  C-reactive protein  Result Value Ref Range   CRP 21.3 (H) <1.0 mg/dL  I-stat chem 8, ED  Result Value Ref Range   Sodium 143 135 - 145 mmol/L   Potassium 4.1 3.5 - 5.1 mmol/L   Chloride 110 98 - 111 mmol/L   BUN 28 (H) 8 - 23 mg/dL   Creatinine, Ser 1.50 (H) 0.44 - 1.00 mg/dL   Glucose, Bld 124 (H) 70 - 99 mg/dL   Calcium, Ion 1.19 1.15 - 1.40 mmol/L   TCO2 22 22 - 32 mmol/L   Hemoglobin 13.9 12.0 - 15.0 g/dL   HCT 41.0 36.0 - 46.0 %  Troponin I (High Sensitivity)  Result Value Ref Range   Troponin I (High Sensitivity) 8 <18 ng/L  Troponin I (High Sensitivity)  Result Value Ref Range   Troponin I (High Sensitivity) 9 <18 ng/L   MR BRAIN WO CONTRAST  Result Date: 12/23/2021 CLINICAL DATA:  Initial evaluation for neuro deficit, stroke suspected. EXAM: MRI HEAD WITHOUT CONTRAST TECHNIQUE: Multiplanar, multiecho pulse sequences of the brain and surrounding structures were obtained without intravenous contrast. COMPARISON:  Prior CT from 12/22/2021 and MRI from 05/03/2021. FINDINGS: Brain: Cerebral volume within normal limits for age. Mild  chronic microvascular ischemic disease. Encephalomalacia and gliosis involving the right cerebral hemisphere and right basal ganglia, consistent with chronic right MCA distribution infarct. Associated scattered chronic hemosiderin staining present within these regions. Scattered diffusion signal abnormality within these areas most likely related to underlying susceptibility artifact. No other evidence for acute or subacute ischemia. Gray-white matter differentiation otherwise maintained. No other areas of chronic cortical infarction. No acute intracranial hemorrhage. Single punctate chronic microhemorrhage noted within the left cerebellum. No mass lesion, midline shift or mass effect. Mild ex vacuo dilatation of the right lateral ventricle related to the right cerebral encephalomalacia. No hydrocephalus. No extra-axial fluid collection. Pituitary gland and suprasellar region within normal limits. Vascular: Major intracranial vascular flow voids are maintained. Diminutive vertebrobasilar system noted, suspected to be related to fetal type origin of the PCAs. Skull and upper cervical spine: Craniocervical junction within normal limits. Bone marrow signal intensity normal. No scalp soft tissue abnormality. Sinuses/Orbits: Prior bilateral ocular lens replacement. Scattered mucosal thickening noted throughout the ethmoidal air cells and maxillary sinuses. Small 2 moderate bilateral mastoid effusions noted, similar to prior, and likely chronic. Visualized nasopharynx unremarkable. Other: None. IMPRESSION: 1. No acute intracranial abnormality. 2. 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 Electronically Signed   By: Jeannine Boga M.D.   On: 12/23/2021 04:06   CT HEAD WO CONTRAST  Result Date: 12/22/2021 CLINICAL DATA:  Headache. EXAM: CT HEAD WITHOUT CONTRAST TECHNIQUE: Contiguous axial images were obtained from the base of the skull through the vertex  without intravenous contrast. RADIATION DOSE REDUCTION: This exam was performed according to the departmental dose-optimization program which includes automated exposure control, adjustment of the mA and/or kV according to patient size and/or use of iterative reconstruction technique. COMPARISON:  Head CT dated 07/04/2021. FINDINGS: Brain: Old right MCA territory infarct and encephalomalacia. There is mild age-related atrophy and chronic microvascular ischemic changes. There is no acute intracranial hemorrhage. No mass effect or midline shift no extra-axial fluid collection. Vascular: No hyperdense vessel or unexpected calcification. Skull: Normal. Negative for fracture or focal lesion. Sinuses/Orbits: Diffuse mucoperiosteal thickening of paranasal sinuses and partial opacification  of the ethmoid air cells. Small bilateral maxillary sinus air-fluid level. There is right mastoid effusion. Other: None IMPRESSION: 1. No acute intracranial pathology. 2. Old right MCA territory infarct and encephalomalacia. 3. Paranasal sinus disease and right mastoid effusion. Electronically Signed   By: Anner Crete M.D.   On: 12/22/2021 23:38   DG Chest 1 View  Result Date: 12/22/2021 CLINICAL DATA:  Headaches and foaming at the mouth. EXAM: CHEST  1 VIEW COMPARISON:  11/01/2021. FINDINGS: Heart is enlarged the mediastinal contour stable. Atherosclerotic calcification of the aorta is noted. Coarse interstitial markings are noted bilaterally. Mild airspace disease is noted at the lung bases. No effusion or pneumothorax. Sternotomy wires are present over the midline. No acute osseous abnormality. IMPRESSION: 1. Mild atelectasis or infiltrate at the lung bases. 2. Chronic interstitial changes in the lungs. Electronically Signed   By: Brett Fairy M.D.   On: 12/22/2021 23:11     MRI negative for acute findings.  ESR and CRP elevated.  Patient reassessed--- still having headaches.  She does have some tenderness of both temples  and forehead but denies visual disturbance currently.   Discussed with neurology, Dr. Lorrin Goodell-  given labs abnormalities and tenderness on exam, warrants admission for further eval for temporal arteritis, may benefit from temporal [place] as opposed to invasive bx.  Will discuss with hospitalist.  Discussed with Dr. Bridgett Larsson-- has evaluated patient but does not feel she warrants hospitalization given no vision changes.  Formal neurology evaluation and recommendations to follow.  6:21 AM Dr. Lorrin Goodell has evaluated at bedside and recommended admission for further work-up and testing.  Dr. Bridgett Larsson will admit.   Larene Pickett, PA-C 12/23/21 Sela Hua, DO 12/23/21 [phone]

## 2021-12-23 NOTE — Consult Note (Signed)
. Hospitalist Consultation History and Physical    Jenny Kelly FGB:[id number] DOB: [birth year] DOA: 12/22/2021  DOS: the patient was seen and examined on 12/22/2021  PCP: Townsend Roger, MD   Patient coming from: Home  I have personally briefly reviewed patient's old medical records in [place]  CC: headache HPI: 76 yo WF with hx of afib, on xarelto, hx of CVA with residual right sided weakness, hx of cluster HA and migraines, CKD stage 3a, COPD, pulmonary fibrosis, seizure disorder, presents to ER with bitemporal headache today. Associated with photophobia. No vision changes. No nausea or vomiting. Pt on topamax and lyrica for hx of seizures and headaches. Pt took a left over percocet with improvement in headache. Due to severity of HA, pt presented to ER.  Again, no vision changes.  CT head negative for acute CVA MRI brain negative for acute CVA  Pt with chronic cough. No fevers. No chills.  CRP and ESR were checked and were elevated.  [facility] along with neurology consulted.   ED Course: CT head negative for acute CVA, MRI brain negative for acute CVA  Review of Systems:  Review of Systems  Constitutional: Negative.   HENT: Negative.    Eyes: Negative.   Respiratory:         Chronic cough  Cardiovascular: Negative.   Gastrointestinal: Negative.   Genitourinary: Negative.   Musculoskeletal: Negative.   Skin: Negative.   Neurological:  Positive for headaches.       +photophobia  Endo/Heme/Allergies: Negative.   Psychiatric/Behavioral: Negative.    All other systems reviewed and are negative.   Past Medical History:  Diagnosis Date   Anemia    Anxiety    Arthritis    Asthma    Atrial fibrillation ([place])    Blood transfusion without reported diagnosis    Cataract    CHF (congestive heart failure) ([place])    Chronic kidney disease    Clotting disorder (HCC)    COPD (chronic obstructive pulmonary disease) (HCC)    Dementia (HCC)    Depression     Diabetes mellitus without complication (HCC)    Dyspnea    GERD (gastroesophageal reflux disease)    Headache    Heart murmur    History of kidney stones    Hyperlipidemia    Hypothyroidism    Myocardial infarction ([place])    Neuromuscular disorder ([place])    tremors   Pneumonia due to COVID-19 virus    Seizures (HCC)    Sleep apnea    Stroke ([place])    Thyroid disease    Wide-complex tachycardia     Past Surgical History:  Procedure Laterality Date   ABDOMINAL HYSTERECTOMY     APPENDECTOMY     bladder tack     CHOLECYSTECTOMY     CORONARY ARTERY BYPASS GRAFT     CYSTOSCOPY W/ URETERAL STENT PLACEMENT Left 05/01/2021   Procedure: CYSTOSCOPY WITH RETROGRADE PYELOGRAM/URETERAL STENT PLACEMENT;  Surgeon: Ceasar Mons, MD;  Location: [facility];  Service: Urology;  Laterality: Left;   CYSTOSCOPY/URETEROSCOPY/HOLMIUM LASER/STENT PLACEMENT Left 08/09/2021   Procedure: CYSTOSCOPY/URETEROSCOPY/HOLMIUM LASER/STENT EXCHANGE;  Surgeon: Ceasar Mons, MD;  Location: [facility];  Service: Urology;  Laterality: Left;   ESOPHAGOGASTRODUODENOSCOPY ENDOSCOPY     EYE SURGERY     FRACTURE SURGERY Right    3rd finger   JOINT REPLACEMENT Bilateral    knees   TONSILLECTOMY AND ADENOIDECTOMY     TUBAL LIGATION  reports that she quit smoking about 21 years ago. Her smoking use included cigarettes. She has a 15.00 pack-year smoking history. She has never used smokeless tobacco. She reports that she does not currently use alcohol. She reports that she does not use drugs.  Allergies  Allergen Reactions   Ciprofloxacin Hives    Ask patient   Prochlorperazine Edisylate Other (See Comments)    Cant swallow Muscle cramping   Morphine Other (See Comments)    Confusion   Prednisone Other (See Comments)    unknown   Reduced Iso-Alpha Acids Complex Other (See Comments)    unknown   Statins Other (See Comments)    Muscle weakness    Sulfa Antibiotics Swelling   Azithromycin Rash    Latex Rash    Family History  Problem Relation Age of Onset   Heart disease Mother    Heart attack Mother    Breast cancer Mother    Heart disease Sister     Prior to Admission medications   Medication Sig Start Date End Date Taking? Authorizing Provider  acetaminophen (TYLENOL) 500 MG tablet Take 500 mg by mouth every 6 (six) hours as needed for mild pain or moderate pain.    [provider]  aspirin 81 MG EC tablet Take 1 tablet (81 mg total) by mouth daily. Swallow whole. 05/16/21   Love, Evlyn Kanner, PA-C  budesonide (PULMICORT) 0.5 MG/2ML nebulizer solution Take 2 mLs (0.5 mg total) by nebulization in the morning and at bedtime. 11/04/21 12/04/21  Lanae Boast, MD  cetirizine (ZYRTEC) 10 MG tablet Take 10 mg by mouth daily.    [provider]  donepezil (ARICEPT) 10 MG tablet Take 1 tablet (10 mg total) by mouth at bedtime. 05/16/21   Love, Evlyn Kanner, PA-C  guaiFENesin-dextromethorphan (ROBITUSSIN DM) 100-10 MG/5ML syrup Take 10 mLs by mouth every 4 (four) hours as needed for cough. 11/04/21   Lanae Boast, MD  HYDROcodone-acetaminophen (NORCO/VICODIN) 5-325 MG tablet Take 1 tablet by mouth every 6 (six) hours as needed for moderate pain. 06/30/21   [provider]  levothyroxine (SYNTHROID) 88 MCG tablet Take 88 mcg by mouth daily before breakfast.    [provider]  montelukast (SINGULAIR) 10 MG tablet Take 1 tablet (10 mg total) by mouth at bedtime. 05/16/21   Love, Evlyn Kanner, PA-C  Multiple Vitamin (MULTIVITAMIN WITH MINERALS) TABS tablet Take 1 tablet by mouth daily. 05/17/21   Love, Evlyn Kanner, PA-C  Omega-3 Fatty Acids (FISH OIL) 1200 MG CAPS Take 1,200 mg by mouth 2 (two) times daily.    [provider]  omeprazole (PRILOSEC) 40 MG capsule Take 1 capsule (40 mg total) by mouth daily. Patient taking differently: Take 40 mg by mouth 2 (two) times daily. 05/16/21   Love, Evlyn Kanner, PA-C  oxybutynin (DITROPAN) 5 MG tablet Take 0.5 tablets (2.5 mg total)  by mouth 3 (three) times daily. Patient not taking: Reported on 11/01/2021 07/07/21   Lonia Blood, MD  PERCOCET 5-325 MG tablet Take 1 tablet by mouth every 4 (four) hours as needed for severe pain. 07/27/21   [provider]  pravastatin (PRAVACHOL) 80 MG tablet Take 1 tablet (80 mg total) by mouth every evening. Patient not taking: Reported on 11/01/2021 09/15/21 12/14/21  Georgeanna Lea, MD  pregabalin (LYRICA) 50 MG capsule TAKE 1 CAPSULE BY MOUTH 2 TIMES DAILY. 06/30/21   Raulkar, Drema Pry, MD  Probiotic Product (PROBIOTIC PO) Take 15 Billion Cells by mouth daily.  [provider]  Rivaroxaban (XARELTO) 15 MG TABS tablet Take 1 tablet (15 mg total) by mouth daily. 09/15/21   Park Liter, MD  topiramate (TOPAMAX) 200 MG tablet Take 1 tablet (200 mg total) by mouth 2 (two) times daily. 05/16/21   Love, Ivan Anchors, PA-C  URINARY HEALTH/CRANBERRY PO Take 1 tablet by mouth 2 (two) times daily.    [provider]    Physical Exam: Vitals:   12/22/21 2103 12/23/21 0123  BP: 128/60 130/67  Pulse: 84 88  Resp: 18 16  Temp: 98 F (36.7 C) 98.3 F (36.8 C)  TempSrc: Oral Oral  SpO2: 94% 92%    Physical Exam Vitals and nursing note reviewed.  Constitutional:      General: She is not in acute distress.    Appearance: She is obese. She is not toxic-appearing or diaphoretic.     Comments: Chronically ill appearing  HENT:     Head: Normocephalic and atraumatic.     Nose: Nose normal.     Mouth/Throat:     Comments: Dentition in poor repair Eyes:     Pupils: Pupils are equal, round, and reactive to light.     Comments: +photophobia  Cardiovascular:     Rate and Rhythm: Normal rate and regular rhythm.     Pulses: Normal pulses.  Pulmonary:     Effort: Pulmonary effort is normal.     Comments: Dry rales bilaterally No wheezing Abdominal:     General: Abdomen is protuberant. Bowel sounds are normal. There is no distension.     Palpations: Abdomen  is soft.     Tenderness: There is no abdominal tenderness. There is no guarding or rebound.  Musculoskeletal:     Cervical back: No rigidity.     Right lower leg: No edema.     Left lower leg: No edema.  Skin:    General: Skin is warm and dry.     Capillary Refill: Capillary refill takes less than 2 seconds.  Neurological:     Mental Status: She is alert and oriented to person, place, and time.     Comments: Slight tenderness bilateral temporal areas Bilateral palpable temporal arteries No induration or edema around temporal arteries      Labs on Admission: I have personally reviewed following labs and imaging studies  CBC: Recent Labs  Lab 12/22/21 2223 12/22/21 2228  WBC 15.7*  --   NEUTROABS 11.5*  --   HGB 13.1 13.9  HCT 41.2 41.0  MCV 89.8  --   PLT 213  --    Basic Metabolic Panel: Recent Labs  Lab 12/22/21 2223 12/22/21 2228  NA 143 143  K 4.2 4.1  CL 107 110  CO2 22  --   GLUCOSE 127* 124*  BUN 28* 28*  CREATININE 1.50* 1.50*  CALCIUM 9.3  --    GFR: CrCl cannot be calculated (Unknown ideal weight.). Liver Function Tests: Recent Labs  Lab 12/22/21 2223  AST 25  ALT 9  ALKPHOS 81  BILITOT 0.5  PROT 6.9  ALBUMIN 2.7*   No results for input(s): "LIPASE", "AMYLASE" in the last 168 hours. No results for input(s): "AMMONIA" in the last 168 hours. Coagulation Profile: No results for input(s): "INR", "PROTIME" in the last 168 hours. Cardiac Enzymes: Recent Labs  Lab 12/22/21 2340 12/23/21 0058  TROPONINIHS 8 9   BNP (last 3 results) No results for input(s): "PROBNP" in the last 8760 hours. HbA1C: No results for input(s): "HGBA1C"  in the last 72 hours. CBG: No results for input(s): "GLUCAP" in the last 168 hours. Lipid Profile: No results for input(s): "CHOL", "HDL", "LDLCALC", "TRIG", "CHOLHDL", "LDLDIRECT" in the last 72 hours. Thyroid Function Tests: No results for input(s): "TSH", "T4TOTAL", "FREET4", "T3FREE", "THYROIDAB" in the last  72 hours. Anemia Panel: No results for input(s): "VITAMINB12", "FOLATE", "FERRITIN", "TIBC", "IRON", "RETICCTPCT" in the last 72 hours. Urine analysis:    Component Value Date/Time   COLORURINE YELLOW 12/22/2021 2155   APPEARANCEUR CLEAR 12/22/2021 2155   LABSPEC 1.015 12/22/2021 2155   PHURINE 5.0 12/22/2021 2155   GLUCOSEU NEGATIVE 12/22/2021 2155   HGBUR NEGATIVE 12/22/2021 2155   BILIRUBINUR NEGATIVE 12/22/2021 2155   KETONESUR NEGATIVE 12/22/2021 2155   PROTEINUR NEGATIVE 12/22/2021 2155   NITRITE NEGATIVE 12/22/2021 2155   LEUKOCYTESUR SMALL (A) 12/22/2021 2155    Radiological Exams on Admission: I have personally reviewed images MR BRAIN WO CONTRAST  Result Date: 12/23/2021 CLINICAL DATA:  Initial evaluation for neuro deficit, stroke suspected. EXAM: MRI HEAD WITHOUT CONTRAST TECHNIQUE: Multiplanar, multiecho pulse sequences of the brain and surrounding structures were obtained without intravenous contrast. COMPARISON:  Prior CT from 12/22/2021 and MRI from 05/03/2021. FINDINGS: Brain: Cerebral volume within normal limits for age. Mild chronic microvascular ischemic disease. Encephalomalacia and gliosis involving the right cerebral hemisphere and right basal ganglia, consistent with chronic right MCA distribution infarct. Associated scattered chronic hemosiderin staining present within these regions. Scattered diffusion signal abnormality within these areas most likely related to underlying susceptibility artifact. No other evidence for acute or subacute ischemia. Gray-white matter differentiation otherwise maintained. No other areas of chronic cortical infarction. No acute intracranial hemorrhage. Single punctate chronic microhemorrhage noted within the left cerebellum. No mass lesion, midline shift or mass effect. Mild ex vacuo dilatation of the right lateral ventricle related to the right cerebral encephalomalacia. No hydrocephalus. No extra-axial fluid collection. Pituitary gland and  suprasellar region within normal limits. Vascular: Major intracranial vascular flow voids are maintained. Diminutive vertebrobasilar system noted, suspected to be related to fetal type origin of the PCAs. Skull and upper cervical spine: Craniocervical junction within normal limits. Bone marrow signal intensity normal. No scalp soft tissue abnormality. Sinuses/Orbits: Prior bilateral ocular lens replacement. Scattered mucosal thickening noted throughout the ethmoidal air cells and maxillary sinuses. Small 2 moderate bilateral mastoid effusions noted, similar to prior, and likely chronic. Visualized nasopharynx unremarkable. Other: None. IMPRESSION: 1. No acute intracranial abnormality. 2. 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 Electronically Signed   By: Jeannine Boga M.D.   On: 12/23/2021 04:06   CT HEAD WO CONTRAST  Result Date: 12/22/2021 CLINICAL DATA:  Headache. EXAM: CT HEAD WITHOUT CONTRAST TECHNIQUE: Contiguous axial images were obtained from the base of the skull through the vertex without intravenous contrast. RADIATION DOSE REDUCTION: This exam was performed according to the departmental dose-optimization program which includes automated exposure control, adjustment of the mA and/or kV according to patient size and/or use of iterative reconstruction technique. COMPARISON:  Head CT dated 07/04/2021. FINDINGS: Brain: Old right MCA territory infarct and encephalomalacia. There is mild age-related atrophy and chronic microvascular ischemic changes. There is no acute intracranial hemorrhage. No mass effect or midline shift no extra-axial fluid collection. Vascular: No hyperdense vessel or unexpected calcification. Skull: Normal. Negative for fracture or focal lesion. Sinuses/Orbits: Diffuse mucoperiosteal thickening of paranasal sinuses and partial opacification of the ethmoid air cells. Small bilateral maxillary sinus air-fluid level. There  is right mastoid effusion. Other: None IMPRESSION:  1. No acute intracranial pathology. 2. Old right MCA territory infarct and encephalomalacia. 3. Paranasal sinus disease and right mastoid effusion. Electronically Signed   By: Anner Crete M.D.   On: 12/22/2021 23:38   DG Chest 1 View  Result Date: 12/22/2021 CLINICAL DATA:  Headaches and foaming at the mouth. EXAM: CHEST  1 VIEW COMPARISON:  11/01/2021. FINDINGS: Heart is enlarged the mediastinal contour stable. Atherosclerotic calcification of the aorta is noted. Coarse interstitial markings are noted bilaterally. Mild airspace disease is noted at the lung bases. No effusion or pneumothorax. Sternotomy wires are present over the midline. No acute osseous abnormality. IMPRESSION: 1. Mild atelectasis or infiltrate at the lung bases. 2. Chronic interstitial changes in the lungs. Electronically Signed   By: Brett Fairy M.D.   On: 12/22/2021 23:11    EKG: My personal interpretation of EKG shows: no EKG performed  Assessment/Plan Active Problems:   PAF (paroxysmal atrial fibrillation) (HCC)   Headache    Assessment and Plan: PAF (paroxysmal atrial fibrillation) (HCC) Remains on xarelto  Headache Pt with long hx of cluster and migraine headache. Although pt states current headaches does not feel like her migraines. Headache did improve with percocet. Pt's husband noted that pt was able to fall asleep despite pt stating her headache was 10/10. Pt states her headache has returned and requesting another percocet. Neurology also consulted to evaluate for temporal arteritis. Vascular U/S ordered for temporal artery.  Even if she has possible temporal arteritis on U/S, without any vision loss or vision changes, pt would not need hospitalization for high dose IV solumedrol. If she were in the outpatient setting, she would be prescribed po prednisone.  At this point, pt does not warrant hospital admission.  Temporal artery U/S has been ordered by  neurology. Defer to neurology for plan.    Family Communication: discussed with pt and husband  Disposition Plan: return home  Consults called: EDP has consulted neurology  Admission status:  does not warrant hospitalization ,  sent secure chat to [place], DO [facility] 12/23/2021, 5:37 AM

## 2021-12-23 NOTE — Assessment & Plan Note (Signed)
Stable

## 2021-12-23 NOTE — Assessment & Plan Note (Signed)
Stable. Euvolemic. ?

## 2021-12-23 NOTE — Subjective & Objective (Signed)
CC: headache HPI: 76 yo WF with hx of afib, on xarelto, hx of CVA with residual right sided weakness, hx of cluster HA and migraines, CKD stage 3a, COPD, pulmonary fibrosis, seizure disorder, presents to ER with bitemporal headache today. Associated with photophobia. No vision changes. No nausea or vomiting. Pt on topamax and lyrica for hx of seizures and headaches. Pt took a left over percocet with improvement in headache. Due to severity of HA, pt presented to ER.  Again, no vision changes.  CT head negative for acute CVA MRI brain negative for acute CVA  Pt with chronic cough. No fevers. No chills.  CRP and ESR were checked and were elevated.  [facility] hospitalist along with neurology consulted.

## 2021-12-23 NOTE — H&P (Signed)
History and Physical    Jenny Kelly TLX:[id number] DOB: [birth year] DOA: 12/22/2021  DOS: the patient was seen and examined on 12/22/2021  PCP: Townsend Roger, MD   Patient coming from: Home  I have personally briefly reviewed patient's old medical records in [place]  CC: headache HPI: 76 yo WF with hx of afib, on xarelto, hx of CVA with residual right sided weakness, hx of cluster HA and migraines, CKD stage 3a, COPD, pulmonary fibrosis, seizure disorder, presents to ER with bitemporal headache today. Associated with photophobia. No vision changes. No nausea or vomiting. Pt on topamax and lyrica for hx of seizures and headaches. Pt took a left over percocet with improvement in headache. Due to severity of HA, pt presented to ER.  Again, no vision changes.  CT head negative for acute CVA MRI brain negative for acute CVA  Pt with chronic cough. No fevers. No chills.  CRP and ESR were checked and were elevated.  [facility] along with neurology consulted.   ED Course: MRI brain negative. CT head negative  Review of Systems:  Review of Systems  Constitutional: Negative.   HENT: Negative.    Eyes: Negative.   Respiratory:  Positive for cough.   Cardiovascular: Negative.   Gastrointestinal: Negative.   Genitourinary: Negative.   Musculoskeletal: Negative.   Skin: Negative.   Neurological:  Positive for headaches.  Endo/Heme/Allergies: Negative.   Psychiatric/Behavioral: Negative.    All other systems reviewed and are negative.   Past Medical History:  Diagnosis Date   Anemia    Anxiety    Arthritis    Asthma    Atrial fibrillation ([place])    Blood transfusion without reported diagnosis    Cataract    CHF (congestive heart failure) ([place])    Chronic kidney disease    Clotting disorder (HCC)    COPD (chronic obstructive pulmonary disease) (HCC)    Dementia (HCC)    Depression    Diabetes mellitus without complication (HCC)    Dyspnea    GERD  (gastroesophageal reflux disease)    Headache    Heart murmur    History of kidney stones    Hyperlipidemia    Hypothyroidism    Myocardial infarction ([place])    Neuromuscular disorder ([place])    tremors   Pneumonia due to COVID-19 virus    Seizures (HCC)    Sleep apnea    Stroke ([place])    Thyroid disease    Wide-complex tachycardia     Past Surgical History:  Procedure Laterality Date   ABDOMINAL HYSTERECTOMY     APPENDECTOMY     bladder tack     CHOLECYSTECTOMY     CORONARY ARTERY BYPASS GRAFT     CYSTOSCOPY W/ URETERAL STENT PLACEMENT Left 05/01/2021   Procedure: CYSTOSCOPY WITH RETROGRADE PYELOGRAM/URETERAL STENT PLACEMENT;  Surgeon: Ceasar Mons, MD;  Location: [address];  Service: Urology;  Laterality: Left;   CYSTOSCOPY/URETEROSCOPY/HOLMIUM LASER/STENT PLACEMENT Left 08/09/2021   Procedure: CYSTOSCOPY/URETEROSCOPY/HOLMIUM LASER/STENT EXCHANGE;  Surgeon: Ceasar Mons, MD;  Location: [facility];  Service: Urology;  Laterality: Left;   ESOPHAGOGASTRODUODENOSCOPY ENDOSCOPY     EYE SURGERY     FRACTURE SURGERY Right    3rd finger   JOINT REPLACEMENT Bilateral    knees   TONSILLECTOMY AND ADENOIDECTOMY     TUBAL LIGATION       reports that she quit smoking about 21 years ago. Her smoking use included cigarettes. She has a 15.00 pack-year smoking history.  She has never used smokeless tobacco. She reports that she does not currently use alcohol. She reports that she does not use drugs.  Allergies  Allergen Reactions   Ciprofloxacin Hives    Ask patient   Prochlorperazine Edisylate Other (See Comments)    Cant swallow Muscle cramping   Morphine Other (See Comments)    Confusion   Prednisone Other (See Comments)    unknown   Reduced Iso-Alpha Acids Complex Other (See Comments)    unknown   Statins Other (See Comments)    Muscle weakness    Sulfa Antibiotics Swelling   Azithromycin Rash   Latex Rash    Family History  Problem Relation Age of Onset    Heart disease Mother    Heart attack Mother    Breast cancer Mother    Heart disease Sister     Prior to Admission medications   Medication Sig Start Date End Date Taking? Authorizing Provider  acetaminophen (TYLENOL) 500 MG tablet Take 500 mg by mouth every 6 (six) hours as needed for mild pain or moderate pain.    [provider]  aspirin 81 MG EC tablet Take 1 tablet (81 mg total) by mouth daily. Swallow whole. 05/16/21   Love, Ivan Anchors, PA-C  budesonide (PULMICORT) 0.5 MG/2ML nebulizer solution Take 2 mLs (0.5 mg total) by nebulization in the morning and at bedtime. 11/04/21 12/04/21  Antonieta Pert, MD  cetirizine (ZYRTEC) 10 MG tablet Take 10 mg by mouth daily.    [provider]  donepezil (ARICEPT) 10 MG tablet Take 1 tablet (10 mg total) by mouth at bedtime. 05/16/21   Love, Ivan Anchors, PA-C  guaiFENesin-dextromethorphan (ROBITUSSIN DM) 100-10 MG/5ML syrup Take 10 mLs by mouth every 4 (four) hours as needed for cough. 11/04/21   Antonieta Pert, MD  HYDROcodone-acetaminophen (NORCO/VICODIN) 5-325 MG tablet Take 1 tablet by mouth every 6 (six) hours as needed for moderate pain. 06/30/21   [provider]  levothyroxine (SYNTHROID) 88 MCG tablet Take 88 mcg by mouth daily before breakfast.    [provider]  montelukast (SINGULAIR) 10 MG tablet Take 1 tablet (10 mg total) by mouth at bedtime. 05/16/21   Love, Ivan Anchors, PA-C  Multiple Vitamin (MULTIVITAMIN WITH MINERALS) TABS tablet Take 1 tablet by mouth daily. 05/17/21   Love, Ivan Anchors, PA-C  Omega-3 Fatty Acids (FISH OIL) 1200 MG CAPS Take 1,200 mg by mouth 2 (two) times daily.    [provider]  omeprazole (PRILOSEC) 40 MG capsule Take 1 capsule (40 mg total) by mouth daily. Patient taking differently: Take 40 mg by mouth 2 (two) times daily. 05/16/21   Love, Ivan Anchors, PA-C  oxybutynin (DITROPAN) 5 MG tablet Take 0.5 tablets (2.5 mg total) by mouth 3 (three) times daily. Patient not taking: Reported on  11/01/2021 07/07/21   Cherene Altes, MD  PERCOCET 5-325 MG tablet Take 1 tablet by mouth every 4 (four) hours as needed for severe pain. 07/27/21   [provider]  pravastatin (PRAVACHOL) 80 MG tablet Take 1 tablet (80 mg total) by mouth every evening. Patient not taking: Reported on 11/01/2021 09/15/21 12/14/21  Park Liter, MD  pregabalin (LYRICA) 50 MG capsule TAKE 1 CAPSULE BY MOUTH 2 TIMES DAILY. 06/30/21   Raulkar, Clide Deutscher, MD  Probiotic Product (PROBIOTIC PO) Take 15 Billion Cells by mouth daily.    [provider]  Rivaroxaban (XARELTO) 15 MG TABS tablet Take 1 tablet (15 mg total) by mouth daily. 09/15/21  Park Liter, MD  topiramate (TOPAMAX) 200 MG tablet Take 1 tablet (200 mg total) by mouth 2 (two) times daily. 05/16/21   Love, Ivan Anchors, PA-C  URINARY HEALTH/CRANBERRY PO Take 1 tablet by mouth 2 (two) times daily.    [provider]    Physical Exam: Vitals:   12/22/21 2103 12/23/21 0123 12/23/21 0612  BP: 128/60 130/67 (!) 103/51  Pulse: 84 88 76  Resp: 18 16   Temp: 98 F (36.7 C) 98.3 F (36.8 C) 97.9 F (36.6 C)  TempSrc: Oral Oral Oral  SpO2: 94% 92% 94%    Physical Exam Vitals and nursing note reviewed.  Constitutional:      General: She is not in acute distress.    Appearance: She is normal weight. She is not toxic-appearing or diaphoretic.     Comments: Chronically ill appearing  HENT:     Head: Normocephalic and atraumatic.     Comments: Mild tenderness bilateral temporal arteries    Nose: Nose normal.  Eyes:     Pupils: Pupils are equal, round, and reactive to light.     Comments: +photophobia  Cardiovascular:     Rate and Rhythm: Normal rate and regular rhythm.     Pulses: Normal pulses.  Pulmonary:     Comments: Dry rales bilaterally at bases Abdominal:     General: Abdomen is flat. Bowel sounds are normal. There is no distension.     Palpations: Abdomen is soft.     Tenderness: There is no abdominal  tenderness. There is no guarding.  Musculoskeletal:     Right lower leg: No edema.     Left lower leg: No edema.  Skin:    General: Skin is warm and dry.     Capillary Refill: Capillary refill takes less than 2 seconds.  Neurological:     Mental Status: She is alert and oriented to person, place, and time.      Labs on Admission: I have personally reviewed following labs and imaging studies  CBC: Recent Labs  Lab 12/22/21 2223 12/22/21 2228  WBC 15.7*  --   NEUTROABS 11.5*  --   HGB 13.1 13.9  HCT 41.2 41.0  MCV 89.8  --   PLT 213  --    Basic Metabolic Panel: Recent Labs  Lab 12/22/21 2223 12/22/21 2228  NA 143 143  K 4.2 4.1  CL 107 110  CO2 22  --   GLUCOSE 127* 124*  BUN 28* 28*  CREATININE 1.50* 1.50*  CALCIUM 9.3  --    GFR: CrCl cannot be calculated (Unknown ideal weight.). Liver Function Tests: Recent Labs  Lab 12/22/21 2223  AST 25  ALT 9  ALKPHOS 81  BILITOT 0.5  PROT 6.9  ALBUMIN 2.7*   No results for input(s): "LIPASE", "AMYLASE" in the last 168 hours. No results for input(s): "AMMONIA" in the last 168 hours. Coagulation Profile: No results for input(s): "INR", "PROTIME" in the last 168 hours. Cardiac Enzymes: Recent Labs  Lab 12/22/21 2340 12/23/21 0058  TROPONINIHS 8 9   BNP (last 3 results) No results for input(s): "PROBNP" in the last 8760 hours. HbA1C: No results for input(s): "HGBA1C" in the last 72 hours. CBG: No results for input(s): "GLUCAP" in the last 168 hours. Lipid Profile: No results for input(s): "CHOL", "HDL", "LDLCALC", "TRIG", "CHOLHDL", "LDLDIRECT" in the last 72 hours. Thyroid Function Tests: No results for input(s): "TSH", "T4TOTAL", "FREET4", "T3FREE", "THYROIDAB" in the last 72 hours. Anemia Panel: No  results for input(s): "VITAMINB12", "FOLATE", "FERRITIN", "TIBC", "IRON", "RETICCTPCT" in the last 72 hours. Urine analysis:    Component Value Date/Time   COLORURINE YELLOW 12/22/2021 2155   APPEARANCEUR  CLEAR 12/22/2021 2155   LABSPEC 1.015 12/22/2021 2155   PHURINE 5.0 12/22/2021 2155   GLUCOSEU NEGATIVE 12/22/2021 2155   HGBUR NEGATIVE 12/22/2021 2155   BILIRUBINUR NEGATIVE 12/22/2021 2155   KETONESUR NEGATIVE 12/22/2021 2155   PROTEINUR NEGATIVE 12/22/2021 2155   NITRITE NEGATIVE 12/22/2021 2155   LEUKOCYTESUR SMALL (A) 12/22/2021 2155    Radiological Exams on Admission: I have personally reviewed images MR BRAIN WO CONTRAST  Result Date: 12/23/2021 CLINICAL DATA:  Initial evaluation for neuro deficit, stroke suspected. EXAM: MRI HEAD WITHOUT CONTRAST TECHNIQUE: Multiplanar, multiecho pulse sequences of the brain and surrounding structures were obtained without intravenous contrast. COMPARISON:  Prior CT from 12/22/2021 and MRI from 05/03/2021. FINDINGS: Brain: Cerebral volume within normal limits for age. Mild chronic microvascular ischemic disease. Encephalomalacia and gliosis involving the right cerebral hemisphere and right basal ganglia, consistent with chronic right MCA distribution infarct. Associated scattered chronic hemosiderin staining present within these regions. Scattered diffusion signal abnormality within these areas most likely related to underlying susceptibility artifact. No other evidence for acute or subacute ischemia. Gray-white matter differentiation otherwise maintained. No other areas of chronic cortical infarction. No acute intracranial hemorrhage. Single punctate chronic microhemorrhage noted within the left cerebellum. No mass lesion, midline shift or mass effect. Mild ex vacuo dilatation of the right lateral ventricle related to the right cerebral encephalomalacia. No hydrocephalus. No extra-axial fluid collection. Pituitary gland and suprasellar region within normal limits. Vascular: Major intracranial vascular flow voids are maintained. Diminutive vertebrobasilar system noted, suspected to be related to fetal type origin of the PCAs. Skull and upper cervical spine:  Craniocervical junction within normal limits. Bone marrow signal intensity normal. No scalp soft tissue abnormality. Sinuses/Orbits: Prior bilateral ocular lens replacement. Scattered mucosal thickening noted throughout the ethmoidal air cells and maxillary sinuses. Small 2 moderate bilateral mastoid effusions noted, similar to prior, and likely chronic. Visualized nasopharynx unremarkable. Other: None. IMPRESSION: 1. No acute intracranial abnormality. 2. 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 Electronically Signed   By: Jeannine Boga M.D.   On: 12/23/2021 04:06   CT HEAD WO CONTRAST  Result Date: 12/22/2021 CLINICAL DATA:  Headache. EXAM: CT HEAD WITHOUT CONTRAST TECHNIQUE: Contiguous axial images were obtained from the base of the skull through the vertex without intravenous contrast. RADIATION DOSE REDUCTION: This exam was performed according to the departmental dose-optimization program which includes automated exposure control, adjustment of the mA and/or kV according to patient size and/or use of iterative reconstruction technique. COMPARISON:  Head CT dated 07/04/2021. FINDINGS: Brain: Old right MCA territory infarct and encephalomalacia. There is mild age-related atrophy and chronic microvascular ischemic changes. There is no acute intracranial hemorrhage. No mass effect or midline shift no extra-axial fluid collection. Vascular: No hyperdense vessel or unexpected calcification. Skull: Normal. Negative for fracture or focal lesion. Sinuses/Orbits: Diffuse mucoperiosteal thickening of paranasal sinuses and partial opacification of the ethmoid air cells. Small bilateral maxillary sinus air-fluid level. There is right mastoid effusion. Other: None IMPRESSION: 1. No acute intracranial pathology. 2. Old right MCA territory infarct and encephalomalacia. 3. Paranasal sinus disease and right mastoid effusion. Electronically Signed   By: Anner Crete M.D.   On: 12/22/2021 23:38   DG Chest 1 View  Result Date: 12/22/2021 CLINICAL DATA:  Headaches and foaming at the  mouth. EXAM: CHEST  1 VIEW COMPARISON:  11/01/2021. FINDINGS: Heart is enlarged the mediastinal contour stable. Atherosclerotic calcification of the aorta is noted. Coarse interstitial markings are noted bilaterally. Mild airspace disease is noted at the lung bases. No effusion or pneumothorax. Sternotomy wires are present over the midline. No acute osseous abnormality. IMPRESSION: 1. Mild atelectasis or infiltrate at the lung bases. 2. Chronic interstitial changes in the lungs. Electronically Signed   By: Brett Fairy M.D.   On: 12/22/2021 23:11    EKG: My personal interpretation of EKG shows: no EKG    Assessment/Plan Principal Problem:   Headache Active Problems:   History of CVA with residual deficit   Chronic respiratory failure with hypoxia (HCC)   PAF (paroxysmal atrial fibrillation) (HCC)   Stage 3a chronic kidney disease (CKD) (HCC)   Pulmonary fibrosis (HCC)   COPD (chronic obstructive pulmonary disease) (HCC)   DNR (do not resuscitate)   (HFpEF) heart failure with preserved ejection fraction (HCC)    Assessment and Plan: * Headache Pt with long hx of cluster and migraine headache. Although pt states current headaches does not feel like her migraines. Headache did improve with percocet. Pt's husband noted that pt was able to fall asleep despite pt stating her headache was 10/10. Pt states her headache has returned and requesting another percocet. Neurology also consulted to evaluate for temporal arteritis. Vascular U/S ordered for temporal artery.  Even if she has possible temporal arteritis on U/S, without any vision loss or vision changes, pt would not need hospitalization for high dose IV solumedrol. If she were in the outpatient setting, she would be prescribed po prednisone.  At this point, pt does not warrant hospital admission.  Temporal artery  U/S has been ordered by neurology. Defer to neurology for plan.  **update. Pt seen by neurology. Now they want pt admitted for workup.  History of CVA with residual deficit Chronic.  Chronic respiratory failure with hypoxia (HCC) Stable.  PAF (paroxysmal atrial fibrillation) (HCC) Remains on xarelto  Stage 3a chronic kidney disease (CKD) (HCC) Stable.  (HFpEF) heart failure with preserved ejection fraction (HCC) Stable. Euvolemic.  DNR (do not resuscitate) Stable.  COPD (chronic obstructive pulmonary disease) (HCC) Stable. Prn duonebs.  Pulmonary fibrosis (HCC) Chronic. Stable.    DVT prophylaxis: Xarelto Code Status: DNR/DNI(Do NOT Intubate) Family Communication: discussed with pt and husband  Disposition Plan: return home  Consults called: neurology  Admission status: Observation, Med-Surg   Kristopher Oppenheim, DO [facility] 12/23/2021, 6:13 AM

## 2021-12-23 NOTE — Assessment & Plan Note (Addendum)
Pt with long hx of cluster and migraine headache. Although pt states current headaches does not feel like her migraines. Headache did improve with percocet. Pt's husband noted that pt was able to fall asleep despite pt stating her headache was 10/10. Pt states her headache has returned and requesting another percocet. Neurology also consulted to evaluate for temporal arteritis. Vascular U/S ordered for temporal artery.  Even if she has possible temporal arteritis on U/S, without any vision loss or vision changes, pt would not need hospitalization for high dose IV solumedrol. If she were in the outpatient setting, she would be prescribed po prednisone.  At this point, pt does not warrant hospital admission.  Temporal artery U/S has been ordered by neurology. Defer to neurology for plan.  **update. Pt seen by neurology. Now they want pt admitted for workup.

## 2021-12-23 NOTE — Assessment & Plan Note (Signed)
Remains on xarelto

## 2021-12-23 NOTE — Assessment & Plan Note (Signed)
Chronic. 

## 2021-12-23 NOTE — ED Notes (Signed)
Patient transported to MRI 

## 2021-12-23 NOTE — Progress Notes (Signed)
Temporal artery duplex study completed.   Please see CV Proc for preliminary results.   Sherlynn Tourville, RDMS, RVT  

## 2021-12-23 NOTE — Assessment & Plan Note (Signed)
Chronic Stable 

## 2021-12-24 DIAGNOSIS — J8410 Pulmonary fibrosis, unspecified: Secondary | ICD-10-CM | POA: Diagnosis present

## 2021-12-24 DIAGNOSIS — R569 Unspecified convulsions: Secondary | ICD-10-CM | POA: Diagnosis not present

## 2021-12-24 DIAGNOSIS — M316 Other giant cell arteritis: Secondary | ICD-10-CM | POA: Diagnosis present

## 2021-12-24 DIAGNOSIS — M5124 Other intervertebral disc displacement, thoracic region: Secondary | ICD-10-CM | POA: Diagnosis not present

## 2021-12-24 DIAGNOSIS — M5126 Other intervertebral disc displacement, lumbar region: Secondary | ICD-10-CM | POA: Diagnosis not present

## 2021-12-24 DIAGNOSIS — M47816 Spondylosis without myelopathy or radiculopathy, lumbar region: Secondary | ICD-10-CM | POA: Diagnosis not present

## 2021-12-24 DIAGNOSIS — M4314 Spondylolisthesis, thoracic region: Secondary | ICD-10-CM | POA: Diagnosis not present

## 2021-12-24 DIAGNOSIS — Z8616 Personal history of COVID-19: Secondary | ICD-10-CM | POA: Diagnosis not present

## 2021-12-24 DIAGNOSIS — Z881 Allergy status to other antibiotic agents status: Secondary | ICD-10-CM | POA: Diagnosis not present

## 2021-12-24 DIAGNOSIS — Z888 Allergy status to other drugs, medicaments and biological substances status: Secondary | ICD-10-CM | POA: Diagnosis not present

## 2021-12-24 DIAGNOSIS — J449 Chronic obstructive pulmonary disease, unspecified: Secondary | ICD-10-CM | POA: Diagnosis not present

## 2021-12-24 DIAGNOSIS — I5032 Chronic diastolic (congestive) heart failure: Secondary | ICD-10-CM | POA: Diagnosis present

## 2021-12-24 DIAGNOSIS — N1831 Chronic kidney disease, stage 3a: Secondary | ICD-10-CM | POA: Diagnosis present

## 2021-12-24 DIAGNOSIS — E785 Hyperlipidemia, unspecified: Secondary | ICD-10-CM | POA: Diagnosis present

## 2021-12-24 DIAGNOSIS — I480 Paroxysmal atrial fibrillation: Secondary | ICD-10-CM | POA: Diagnosis present

## 2021-12-24 DIAGNOSIS — N183 Chronic kidney disease, stage 3 unspecified: Secondary | ICD-10-CM | POA: Diagnosis not present

## 2021-12-24 DIAGNOSIS — R2990 Unspecified symptoms and signs involving the nervous system: Secondary | ICD-10-CM | POA: Diagnosis not present

## 2021-12-24 DIAGNOSIS — J440 Chronic obstructive pulmonary disease with acute lower respiratory infection: Secondary | ICD-10-CM | POA: Diagnosis not present

## 2021-12-24 DIAGNOSIS — J9611 Chronic respiratory failure with hypoxia: Secondary | ICD-10-CM | POA: Diagnosis present

## 2021-12-24 DIAGNOSIS — R519 Headache, unspecified: Secondary | ICD-10-CM | POA: Diagnosis present

## 2021-12-24 DIAGNOSIS — I6930 Unspecified sequelae of cerebral infarction: Secondary | ICD-10-CM | POA: Diagnosis not present

## 2021-12-24 DIAGNOSIS — G9389 Other specified disorders of brain: Secondary | ICD-10-CM | POA: Diagnosis not present

## 2021-12-24 DIAGNOSIS — G40909 Epilepsy, unspecified, not intractable, without status epilepticus: Secondary | ICD-10-CM | POA: Diagnosis present

## 2021-12-24 DIAGNOSIS — I69351 Hemiplegia and hemiparesis following cerebral infarction affecting right dominant side: Secondary | ICD-10-CM | POA: Diagnosis not present

## 2021-12-24 DIAGNOSIS — R51 Headache with orthostatic component, not elsewhere classified: Secondary | ICD-10-CM | POA: Diagnosis not present

## 2021-12-24 DIAGNOSIS — G9600 Cerebrospinal fluid leak, unspecified: Secondary | ICD-10-CM | POA: Diagnosis not present

## 2021-12-24 DIAGNOSIS — F0390 Unspecified dementia without behavioral disturbance: Secondary | ICD-10-CM | POA: Diagnosis present

## 2021-12-24 DIAGNOSIS — J209 Acute bronchitis, unspecified: Secondary | ICD-10-CM | POA: Diagnosis not present

## 2021-12-24 DIAGNOSIS — Z66 Do not resuscitate: Secondary | ICD-10-CM | POA: Diagnosis present

## 2021-12-24 DIAGNOSIS — Z885 Allergy status to narcotic agent status: Secondary | ICD-10-CM | POA: Diagnosis not present

## 2021-12-24 DIAGNOSIS — Z87891 Personal history of nicotine dependence: Secondary | ICD-10-CM | POA: Diagnosis not present

## 2021-12-24 DIAGNOSIS — G43909 Migraine, unspecified, not intractable, without status migrainosus: Secondary | ICD-10-CM | POA: Diagnosis present

## 2021-12-24 DIAGNOSIS — E039 Hypothyroidism, unspecified: Secondary | ICD-10-CM | POA: Diagnosis present

## 2021-12-24 DIAGNOSIS — Z91040 Latex allergy status: Secondary | ICD-10-CM | POA: Diagnosis not present

## 2021-12-24 DIAGNOSIS — Z882 Allergy status to sulfonamides status: Secondary | ICD-10-CM | POA: Diagnosis not present

## 2021-12-24 DIAGNOSIS — E1122 Type 2 diabetes mellitus with diabetic chronic kidney disease: Secondary | ICD-10-CM | POA: Diagnosis present

## 2021-12-24 DIAGNOSIS — R4182 Altered mental status, unspecified: Secondary | ICD-10-CM | POA: Diagnosis not present

## 2021-12-24 DIAGNOSIS — Z20822 Contact with and (suspected) exposure to covid-19: Secondary | ICD-10-CM | POA: Diagnosis present

## 2021-12-24 DIAGNOSIS — K219 Gastro-esophageal reflux disease without esophagitis: Secondary | ICD-10-CM | POA: Diagnosis present

## 2021-12-24 MED ORDER — SODIUM CHLORIDE 0.9 % IV SOLN
1000.0000 mg | INTRAVENOUS | Status: AC
  Administered 2021-12-25: 1000 mg via INTRAVENOUS
  Filled 2021-12-24: qty 16

## 2021-12-24 NOTE — Progress Notes (Addendum)
Neurology Progress Note   S:// Patient is sitting up in bed in NAD. Husband is at the bedside. Patient was given reglan yesterday to help with bitemporal pain/headache that minimally helped the pain. She endorses that this pain is completely different than her usual migraine pain. She does endorse some vision problems with blurriness on lateral gaze. Very sensitive to light touch. [place] of bilateral temporal arteries revealed absence of halo sign. Discussed with patient and husband that will continue high dose steroids for 2 more days and then oral steroids, as well as consult vascular surgery for a biopsy.    O:// Current vital signs: BP (!) 111/50 (BP Location: Left Arm)   Pulse 80   Temp (!) 97.5 F (36.4 C) (Oral)   Resp 14   Ht 5\' 2"  (1.575 m)   Wt 61.1 kg   SpO2 91%   BMI 24.64 kg/m  Vital signs in last 24 hours: Temp:  [97.5 F (36.4 C)-98.2 F (36.8 C)] 97.5 F (36.4 C) (09/03 0306) Pulse Rate:  [47-80] 80 (09/03 0306) Resp:  [12-16] 14 (09/03 0035) BP: (96-114)/(49-54) 111/50 (09/03 0024) SpO2:  [91 %-99 %] 91 % (09/03 0306) Weight:  [61.1 kg] 61.1 kg (09/03 0428)  GENERAL: Awake, alert in NAD HEENT: - Normocephalic and atraumatic, dry mm LUNGS - Clear to auscultation bilaterally with no wheezes CV - S1S2 RRR, no m/r/g, equal pulses bilaterally. ABDOMEN - Soft, nontender, nondistended with normoactive BS Ext: warm, well perfused, intact peripheral pulses, no edema  NEURO:  Mental Status: AA&Ox3  Language: speech is clear.  Naming, repetition, fluency, and comprehension intact. Cranial Nerves: PERRL. EOMI, visual fields full, no facial asymmetry, facial sensation intact, hearing intact, tongue/uvula/soft palate midline, normal sternocleidomastoid and trapezius muscle strength. No evidence of tongue atrophy or fibrillations Motor: 5/5 in right arm and leg, left arm and leg 4/5. Rapid alternating movements are slow on left  Tone: is normal and bulk is normal Sensation-  Intact to light touch bilaterally Coordination: FTN intact bilaterally, no ataxia in BLE. Gait- deferred   Medications  Current Facility-Administered Medications:    acetaminophen (TYLENOL) tablet 650 mg, 650 mg, Oral, Q6H PRN, 650 mg at 12/24/21 0331 **OR** acetaminophen (TYLENOL) suppository 650 mg, 650 mg, Rectal, Q6H PRN, 02/23/22, DO   aspirin EC tablet 81 mg, 81 mg, Oral, Daily, Carollee Herter, DO, 81 mg at 12/24/21 0836   donepezil (ARICEPT) tablet 10 mg, 10 mg, Oral, QHS, 02/23/22, DO, 10 mg at 12/23/21 2123   guaiFENesin-dextromethorphan (ROBITUSSIN DM) 100-10 MG/5ML syrup 10 mL, 10 mL, Oral, Q4H PRN, 2124, DO   ipratropium-albuterol (DUONEB) 0.5-2.5 (3) MG/3ML nebulizer solution 3 mL, 3 mL, Nebulization, Q6H PRN, Carollee Herter, DO   levothyroxine (SYNTHROID) tablet 88 mcg, 88 mcg, Oral, QAC breakfast, Carollee Herter, DO, 88 mcg at 12/24/21 0531   methylPREDNISolone sodium succinate (SOLU-MEDROL) 1,000 mg in sodium chloride 0.9 % 50 mL IVPB, 1,000 mg, Intravenous, Once, 02/23/22, MD, Last Rate: 66 mL/hr at 12/24/21 0835, 1,000 mg at 12/24/21 0835   montelukast (SINGULAIR) tablet 10 mg, 10 mg, Oral, QHS, 02/23/22, DO, 10 mg at 12/23/21 2123   ondansetron (ZOFRAN) tablet 4 mg, 4 mg, Oral, Q6H PRN **OR** ondansetron (ZOFRAN) injection 4 mg, 4 mg, Intravenous, Q6H PRN, 2124, DO   Oral care mouth rinse, 15 mL, Mouth Rinse, PRN, Carollee Herter, Sharl Ma, MD   oxyCODONE-acetaminophen (PERCOCET/ROXICET) 5-325 MG per tablet 1 tablet, 1 tablet, Oral, Q4H PRN, Sarina Ill, DO, 1  tablet at 12/24/21 0427   pantoprazole (PROTONIX) EC tablet 40 mg, 40 mg, Oral, BID, Kristopher Oppenheim, DO, 40 mg at 12/24/21 0835   pregabalin (LYRICA) capsule 50 mg, 50 mg, Oral, BID, Kristopher Oppenheim, DO, 50 mg at 12/24/21 [id number]   Rivaroxaban (XARELTO) tablet 15 mg, 15 mg, Oral, Daily, Kristopher Oppenheim, DO, 15 mg at 12/24/21 0836   topiramate (TOPAMAX) tablet 200 mg, 200 mg, Oral, BID, Kristopher Oppenheim, DO, 200 mg at 12/24/21 0835 Labs CBC     Component Value Date/Time   WBC 15.7 (H) 12/22/2021 2223   RBC 4.59 12/22/2021 2223   HGB 13.9 12/22/2021 2228   HCT 41.0 12/22/2021 2228   PLT 213 12/22/2021 2223   MCV 89.8 12/22/2021 2223   MCH 28.5 12/22/2021 2223   MCHC 31.8 12/22/2021 2223   RDW 16.9 (H) 12/22/2021 2223   LYMPHSABS 2.7 12/22/2021 2223   MONOABS 1.2 (H) 12/22/2021 2223   EOSABS 0.2 12/22/2021 2223   BASOSABS 0.1 12/22/2021 2223    CMP     Component Value Date/Time   NA 143 12/22/2021 2228   NA 145 (H) 06/12/2021 0943   K 4.1 12/22/2021 2228   CL 110 12/22/2021 2228   CO2 22 12/22/2021 2223   GLUCOSE 124 (H) 12/22/2021 2228   BUN 28 (H) 12/22/2021 2228   BUN 26 06/12/2021 0943   CREATININE 1.50 (H) 12/22/2021 2228   CALCIUM 9.3 12/22/2021 2223   PROT 6.9 12/22/2021 2223   ALBUMIN 2.7 (L) 12/22/2021 2223   AST 25 12/22/2021 2223   ALT 9 12/22/2021 2223   ALKPHOS 81 12/22/2021 2223   BILITOT 0.5 12/22/2021 2223   GFRNONAA 36 (L) 12/22/2021 2223    glycosylated hemoglobin  Lipid Panel     Component Value Date/Time   CHOL 148 05/03/2021 0538   TRIG 132 05/03/2021 0538   HDL 24 (L) 05/03/2021 0538   CHOLHDL 6.2 05/03/2021 0538   VLDL 26 05/03/2021 0538   LDLCALC 98 05/03/2021 0538     Imaging I have reviewed images in epic and the results pertinent to this consultation are:  CT-scan of the brain 1. No acute intracranial pathology. 2. Old right MCA territory infarct and encephalomalacia. 3. Paranasal sinus disease and right mastoid effusion.     MRI examination of the brain 1. No acute intracranial abnormality. 2. 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  [place] bilateral temporal - Absence of a "halo" sign in the bilateral temporal artery, although not  definitive, makes a diagnosis of temporal arteritis   Assessment:  76 yo WF with hx of afib, on xarelto, hx of CVA with residual right sided weakness, hx of cluster HA and  migraines, CKD stage 3a, COPD, pulmonary fibrosis, seizure disorder, presents to ER with bitemporal headache today.  No vision changes per patient, but there was concern for visual change on presentation from the initial neurologist and so she was started on solumedrol. Pt on topamax and lyrica for hx of seizures and headaches. Pt took a left over percocet with improvement in headache. Due to severity of HA, pt presented to ER.   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 I have reviewed the above note and made appropriate changes.   She has not had history of previous headaches like this. She states that her typical headache is occipital, no issues in amny years with them. She denies photophobia.   The symptoms were rather abrupt in onset, but with bilateral temporal headache, and a CRP of over 20, as well as possible mild visual changes at presentation, I do not think we can discount the possibility of temporal arteritis even with a negative temporal artery ultrasound.  She is getting pulse dose steroids given that there was concern initially for vision change, and I think this is reasonable.  I would favor getting a vascular surgery consult for biopsy, to expedite this, but following third dose of IV Solu-Medrol she could be discharged on prednisone 60 mg.  Ritta Slot, MD [facility] [phone]  If 7pm- 7am, please page neurology on call as listed in AMION.

## 2021-12-24 NOTE — Consult Note (Signed)
Hospital Consult    Reason for Consult:  Temporal artery biopsy Referring Physician:  Neurology/hospitalist MRN #:  [id number]  History of Present Illness: This is a 76 y.o. female with atrial fibrillation on Xarelto, history of CVA with left-sided weakness, history of cluster headaches and migraines, CKD stage III, COPD, pulmonary fibrosis, seizure disorder that vascular surgery has been consulted for temporal artery biopsy.  Patient presented with bitemporal headache.  She states this started last Thursday.  It has been severe on both sides of the temporal region.  These are different than her previous headaches that are usually posterior.  Has been evaluated by neurology.  ESR and CRP were elevated.  Neurology recommended temporal artery biopsy.  She has been started on IV steroids.  Past Medical History:  Diagnosis Date   Anemia    Anxiety    Arthritis    Asthma    Atrial fibrillation ([place])    Blood transfusion without reported diagnosis    Cataract    CHF (congestive heart failure) ([place])    Chronic kidney disease    Clotting disorder (HCC)    COPD (chronic obstructive pulmonary disease) (HCC)    Dementia (HCC)    Depression    Diabetes mellitus without complication (HCC)    Dyspnea    GERD (gastroesophageal reflux disease)    Headache    Heart murmur    History of kidney stones    Hyperlipidemia    Hypothyroidism    Myocardial infarction ([place])    Neuromuscular disorder ([place])    tremors   Pneumonia due to COVID-19 virus    Seizures (HCC)    Sleep apnea    Stroke ([place])    Thyroid disease    Wide-complex tachycardia     Past Surgical History:  Procedure Laterality Date   ABDOMINAL HYSTERECTOMY     APPENDECTOMY     bladder tack     CHOLECYSTECTOMY     CORONARY ARTERY BYPASS GRAFT     CYSTOSCOPY W/ URETERAL STENT PLACEMENT Left 05/01/2021   Procedure: CYSTOSCOPY WITH RETROGRADE PYELOGRAM/URETERAL STENT PLACEMENT;  Surgeon: Ceasar Mons, MD;  Location:  [facility];  Service: Urology;  Laterality: Left;   CYSTOSCOPY/URETEROSCOPY/HOLMIUM LASER/STENT PLACEMENT Left 08/09/2021   Procedure: CYSTOSCOPY/URETEROSCOPY/HOLMIUM LASER/STENT EXCHANGE;  Surgeon: Ceasar Mons, MD;  Location: [facility];  Service: Urology;  Laterality: Left;   ESOPHAGOGASTRODUODENOSCOPY ENDOSCOPY     EYE SURGERY     FRACTURE SURGERY Right    3rd finger   JOINT REPLACEMENT Bilateral    knees   TONSILLECTOMY AND ADENOIDECTOMY     TUBAL LIGATION      Allergies  Allergen Reactions   Ciprofloxacin Hives    Ask patient   Prochlorperazine Edisylate Other (See Comments)    Cant swallow Muscle cramping   Morphine Other (See Comments)    Confusion   Prednisone Other (See Comments)    unknown   Reduced Iso-Alpha Acids Complex Other (See Comments)    unknown   Statins Other (See Comments)    Muscle weakness    Sulfa Antibiotics Swelling   Azithromycin Rash   Latex Rash    Prior to Admission medications   Medication Sig Start Date End Date Taking? Authorizing Provider  acetaminophen (TYLENOL) 500 MG tablet Take 500 mg by mouth every 6 (six) hours as needed for mild pain or moderate pain.   Yes [provider]  aspirin 81 MG EC tablet Take 1 tablet (81 mg total) by mouth daily. Swallow whole. 05/16/21  Yes Love, Evlyn Kanner, PA-C  budesonide (PULMICORT) 0.5 MG/2ML nebulizer solution Take 2 mLs (0.5 mg total) by nebulization in the morning and at bedtime. 11/04/21 12/23/21 Yes Lanae Boast, MD  cetirizine (ZYRTEC) 10 MG tablet Take 10 mg by mouth daily.   Yes [provider]  docusate sodium (COLACE) 100 MG capsule Take 100 mg by mouth daily.   Yes [provider]  donepezil (ARICEPT) 10 MG tablet Take 1 tablet (10 mg total) by mouth at bedtime. 05/16/21  Yes Love, Evlyn Kanner, PA-C  guaiFENesin (MUCINEX) 600 MG 12 hr tablet Take 600 mg by mouth 2 (two) times daily as needed for cough or to loosen phlegm.   Yes [provider]   guaiFENesin-dextromethorphan (ROBITUSSIN DM) 100-10 MG/5ML syrup Take 10 mLs by mouth every 4 (four) hours as needed for cough. 11/04/21  Yes Lanae Boast, MD  levothyroxine (SYNTHROID) 88 MCG tablet Take 88 mcg by mouth daily before breakfast.   Yes [provider]  montelukast (SINGULAIR) 10 MG tablet Take 1 tablet (10 mg total) by mouth at bedtime. 05/16/21  Yes Love, Evlyn Kanner, PA-C  Multiple Vitamin (MULTIVITAMIN WITH MINERALS) TABS tablet Take 1 tablet by mouth daily. 05/17/21  Yes Love, Evlyn Kanner, PA-C  Omega-3 Fatty Acids (FISH OIL) 1200 MG CAPS Take 1,200 mg by mouth 2 (two) times daily.   Yes [provider]  omeprazole (PRILOSEC) 40 MG capsule Take 1 capsule (40 mg total) by mouth daily. Patient taking differently: Take 40 mg by mouth 2 (two) times daily. 05/16/21  Yes Love, Evlyn Kanner, PA-C  PERCOCET 5-325 MG tablet Take 1 tablet by mouth every 4 (four) hours as needed for severe pain. 07/27/21  Yes [provider]  pregabalin (LYRICA) 50 MG capsule TAKE 1 CAPSULE BY MOUTH 2 TIMES DAILY. Patient taking differently: Take 50 mg by mouth 2 (two) times daily. 06/30/21  Yes Raulkar, Drema Pry, MD  Probiotic Product (PROBIOTIC PO) Take 15 Billion Cells by mouth daily.   Yes [provider]  Rivaroxaban (XARELTO) 15 MG TABS tablet Take 1 tablet (15 mg total) by mouth daily. 09/15/21  Yes Georgeanna Lea, MD  topiramate (TOPAMAX) 200 MG tablet Take 1 tablet (200 mg total) by mouth 2 (two) times daily. 05/16/21  Yes Love, Evlyn Kanner, PA-C  URINARY HEALTH/CRANBERRY PO Take 1 tablet by mouth 2 (two) times daily.   Yes [provider]  pravastatin (PRAVACHOL) 80 MG tablet Take 1 tablet (80 mg total) by mouth every evening. Patient not taking: Reported on 11/01/2021 09/15/21 12/14/21  Georgeanna Lea, MD    Social History   Socioeconomic History   Marital status: Married    Spouse name: Vonna Kotyk   Number of children: 3   Years of education: Not on file   Highest  education level: Not on file  Occupational History   Not on file  Tobacco Use   Smoking status: Former    Packs/day: 0.50    Years: 30.00    Total pack years: 15.00    Types: Cigarettes    Quit date: 06/26/2000    Years since quitting: 21.5   Smokeless tobacco: Never  Vaping Use   Vaping Use: Former  Substance and Sexual Activity   Alcohol use: Not Currently   Drug use: Never   Sexual activity: Not on file  Other Topics Concern   Not on file  Social History Narrative   Not on file   Social Determinants of Health  Financial Resource Strain: Not on file  Food Insecurity: Not on file  Transportation Needs: Not on file  Physical Activity: Not on file  Stress: Not on file  Social Connections: Not on file  Intimate Partner Violence: Not on file     Family History  Problem Relation Age of Onset   Heart disease Mother    Heart attack Mother    Breast cancer Mother    Heart disease Sister     ROS: [x]  Positive   [ ]  Negative   [ ]  All sytems reviewed and are negative  Cardiovascular: []  chest pain/pressure []  palpitations []  SOB lying flat []  DOE []  pain in legs while walking []  pain in legs at rest []  pain in legs at night []  non-healing ulcers []  hx of DVT []  swelling in legs  Pulmonary: []  productive cough []  asthma/wheezing []  home O2  Neurologic: []  weakness in []  arms []  legs []  numbness in []  arms []  legs []  hx of CVA []  mini stroke [] difficulty speaking or slurred speech []  temporary loss of vision in one eye []  dizziness  Hematologic: []  hx of cancer []  bleeding problems []  problems with blood clotting easily  Endocrine:   []  diabetes []  thyroid disease  GI []  vomiting blood []  blood in stool  GU: []  CKD/renal failure []  HD--[]  M/W/F or []  T/T/S []  burning with urination []  blood in urine  Psychiatric: []  anxiety []  depression  Musculoskeletal: []  arthritis []  joint pain  Integumentary: []  rashes []   ulcers  Constitutional: []  fever []  chills   Physical Examination  Vitals:   12/24/21 0900 12/24/21 1251  BP: (!) 118/54 (!) 110/51  Pulse: (!) 56 (!) 50  Resp: 15 17  Temp: 97.7 F (36.5 C) 97.9 F (36.6 C)  SpO2: 100% 99%   Body mass index is 24.64 kg/m.  General:  NAD Gait: Not observed HENT: WNL, normocephalic Pulmonary: normal non-labored breathing Cardiac: regular, without  Murmurs, rubs or gallops Abdomen:  soft, NT/ND Vascular Exam/Pulses: Bilateral temporal pulse palpable Musculoskeletal: no muscle wasting or atrophy  Neurologic: A&O X 3; Appropriate Affect ; 5/5 right upper and lower extremity.  4/5 left upper and lower extremity.   CBC    Component Value Date/Time   WBC 15.7 (H) 12/22/2021 2223   RBC 4.59 12/22/2021 2223   HGB 13.9 12/22/2021 2228   HCT 41.0 12/22/2021 2228   PLT 213 12/22/2021 2223   MCV 89.8 12/22/2021 2223   MCH 28.5 12/22/2021 2223   MCHC 31.8 12/22/2021 2223   RDW 16.9 (H) 12/22/2021 2223   LYMPHSABS 2.7 12/22/2021 2223   MONOABS 1.2 (H) 12/22/2021 2223   EOSABS 0.2 12/22/2021 2223   BASOSABS 0.1 12/22/2021 2223    BMET    Component Value Date/Time   NA 143 12/22/2021 2228   NA 145 (H) 06/12/2021 0943   K 4.1 12/22/2021 2228   CL 110 12/22/2021 2228   CO2 22 12/22/2021 2223   GLUCOSE 124 (H) 12/22/2021 2228   BUN 28 (H) 12/22/2021 2228   BUN 26 06/12/2021 0943   CREATININE 1.50 (H) 12/22/2021 2228   CALCIUM 9.3 12/22/2021 2223   GFRNONAA 36 (L) 12/22/2021 2223    COAGS: Lab Results  Component Value Date   INR 1.4 (H) 11/01/2021   INR 1.4 (H) 07/01/2021   INR 1.4 (H) 05/01/2021     Non-Invasive Vascular Imaging:     Temporal artery [place]:   Summary:  Absence of a "halo" sign in the  bilateral temporal artery, although not  definitive, makes a diagnosis of temporal arteritis unlikely.   ASSESSMENT/PLAN: This is a 76 y.o. female with atrial fibrillation on Xarelto, history of CVA with left-sided weakness,  history of cluster headaches and migraines, CKD stage III, COPD, pulmonary fibrosis, seizure disorder that vascular surgery has been consulted for temporal artery biopsy.  Unusual picture with bilateral temporal headache.  I discussed with neurology and they are are okay with a biopsy of either the right or left temporal artery.  I discussed with the patient that we typically do not biopsy both sides and she has elected for the left side to be biopsied.  I discussed the indications to rule out temporal arteritis.  She has already been started on steroids at the direction of neurology.  We will need to hold her Xarelto.  I offered to do this as an outpatient on Friday after talking to neurology.  She would prefer to have it done while she is here and I put her on the schedule for Wednesday with my partner Dr. Stanford Breed for a left temporal artery biopsy.  Risk benefits discussed.  We will follow.  Marty Heck, MD [facility] Office: [place]

## 2021-12-24 NOTE — Progress Notes (Signed)
[facility]  PROGRESS NOTE  Jenny Kelly VQM:[id number] DOB: 06-26-1945 DOA: 12/22/2021 PCP: Townsend Roger, MD   Brief HPI:    76 year old female with a history of atrial fibrillation on Xarelto, history of CVA with residual right-sided weakness, history of cluster headaches and migraine, CKD stage IIIa, COPD, pulmonary fibrosis, seizure disorder came to ED with bitemporal headache.  This was associated with photophobia.  No vision changes.  No nausea or vomiting.  Patient is on Topamax and Lyrica for history of seizures and headache. In the ED ESR and CRP were elevated.  Neurology was consulted.  MRI brain was negative, CT head was negative.   Subjective   Patient seen and examined, still complains of bitemporal headache.  Started on IV Solu-Medrol per neurology recommendation.   Assessment/Plan:   Bitemporal headache -Due to elevated ESR and CRP there was concern for temporal arteritis -Ultrasound of the temporal artery obtained which was negative for halo sign -Neurology following -Patient is currently on high-dose IV Solu-Medrol -May need temporal artery biopsy after 3 days of IV Solu-Medrol -Called and discussed with vascular surgeon Dr. Carlis Abbott, he will see patient in consult   History of CVA with residual deficit Chronic.   Chronic respiratory failure with hypoxia (HCC) Stable.   PAF (paroxysmal atrial fibrillation) (HCC) Remains on xarelto   Stage 3a chronic kidney disease (CKD) (HCC) Stable.   (HFpEF) heart failure with preserved ejection fraction (HCC) Stable. Euvolemic.   DNR (do not resuscitate) Stable.   COPD (chronic obstructive pulmonary disease) (HCC) Stable. Prn duonebs.   Pulmonary fibrosis (HCC) Chronic. Stable.      Medications     aspirin EC  81 mg Oral Daily   donepezil  10 mg Oral QHS   levothyroxine  88 mcg Oral QAC breakfast   montelukast  10 mg Oral QHS   pantoprazole  40 mg Oral BID   pregabalin  50 mg Oral BID    Rivaroxaban  15 mg Oral Daily   topiramate  200 mg Oral BID     Data Reviewed:   CBG:  No results for input(s): "GLUCAP" in the last 168 hours.  SpO2: 99 %    Vitals:   12/24/21 0306 12/24/21 0428 12/24/21 0900 12/24/21 1251  BP:   (!) 118/54 (!) 110/51  Pulse: 80  (!) 56 (!) 50  Resp:   15 17  Temp: (!) 97.5 F (36.4 C)  97.7 F (36.5 C) 97.9 F (36.6 C)  TempSrc: Oral  Oral Oral  SpO2: 91%  100% 99%  Weight:  61.1 kg    Height:          Data Reviewed:  Basic Metabolic Panel: Recent Labs  Lab 12/22/21 2223 12/22/21 2228  NA 143 143  K 4.2 4.1  CL 107 110  CO2 22  --   GLUCOSE 127* 124*  BUN 28* 28*  CREATININE 1.50* 1.50*  CALCIUM 9.3  --     CBC: Recent Labs  Lab 12/22/21 2223 12/22/21 2228  WBC 15.7*  --   NEUTROABS 11.5*  --   HGB 13.1 13.9  HCT 41.2 41.0  MCV 89.8  --   PLT 213  --     LFT Recent Labs  Lab 12/22/21 2223  AST 25  ALT 9  ALKPHOS 81  BILITOT 0.5  PROT 6.9  ALBUMIN 2.7*     Antibiotics: Anti-infectives (From admission, onward)    None        DVT prophylaxis: Xarelto  Code Status: DNR  Family Communication: Discussed with husband at bedside   CONSULTS neurology   Objective    Physical Examination:   General-appears in no acute distress Heart-S1-S2, regular, no murmur auscultated Lungs-clear to auscultation bilaterally, no wheezing or crackles auscultated Abdomen-soft, nontender, no organomegaly Extremities-no edema in the lower extremities Neuro-alert, oriented x3, no focal deficit noted   Status is: Inpatient:             [name]   [facility] If 7PM-7AM, please contact night-coverage at [email or web address], Office  [phone]   12/24/2021, 2:09 PM  LOS: 0 days

## 2021-12-25 ENCOUNTER — Inpatient Hospital Stay (HOSPITAL_COMMUNITY): Payer: Medicare HMO

## 2021-12-25 DIAGNOSIS — R519 Headache, unspecified: Secondary | ICD-10-CM | POA: Diagnosis not present

## 2021-12-25 DIAGNOSIS — I480 Paroxysmal atrial fibrillation: Secondary | ICD-10-CM | POA: Diagnosis not present

## 2021-12-25 DIAGNOSIS — N1831 Chronic kidney disease, stage 3a: Secondary | ICD-10-CM

## 2021-12-25 DIAGNOSIS — I6930 Unspecified sequelae of cerebral infarction: Secondary | ICD-10-CM | POA: Diagnosis not present

## 2021-12-25 DIAGNOSIS — J8410 Pulmonary fibrosis, unspecified: Secondary | ICD-10-CM | POA: Diagnosis not present

## 2021-12-25 LAB — BASIC METABOLIC PANEL
Anion gap: 6 (ref 5–15)
BUN: 32 mg/dL — ABNORMAL HIGH (ref 8–23)
CO2: 25 mmol/L (ref 22–32)
Calcium: 8.9 mg/dL (ref 8.9–10.3)
Chloride: 110 mmol/L (ref 98–111)
Creatinine, Ser: 1.29 mg/dL — ABNORMAL HIGH (ref 0.44–1.00)
GFR, Estimated: 43 mL/min — ABNORMAL LOW (ref >60)
Glucose, Bld: 121 mg/dL — ABNORMAL HIGH (ref 70–99)
Potassium: 3.9 mmol/L (ref 3.5–5.1)
Sodium: 141 mmol/L (ref 135–145)

## 2021-12-25 LAB — CBC
HCT: 36.2 % (ref 36.0–46.0)
Hemoglobin: 11.5 g/dL — ABNORMAL LOW (ref 12.0–15.0)
MCH: 27.9 pg (ref 26.0–34.0)
MCHC: 31.8 g/dL (ref 30.0–36.0)
MCV: 87.9 fL (ref 80.0–100.0)
Platelets: 214 10*3/uL (ref 150–400)
RBC: 4.12 MIL/uL (ref 3.87–5.11)
RDW: 16.5 % — ABNORMAL HIGH (ref 11.5–15.5)
WBC: 13.3 10*3/uL — ABNORMAL HIGH (ref 4.0–10.5)
nRBC: 0 % (ref 0.0–0.2)

## 2021-12-25 LAB — GLUCOSE, CAPILLARY
Glucose-Capillary: 113 mg/dL — ABNORMAL HIGH (ref 70–99)
Glucose-Capillary: 152 mg/dL — ABNORMAL HIGH (ref 70–99)
Glucose-Capillary: 198 mg/dL — ABNORMAL HIGH (ref 70–99)
Glucose-Capillary: 217 mg/dL — ABNORMAL HIGH (ref 70–99)

## 2021-12-25 MED ORDER — SODIUM CHLORIDE 0.9 % IV BOLUS
250.0000 mL | Freq: Once | INTRAVENOUS | Status: AC
  Administered 2021-12-25: 250 mL via INTRAVENOUS

## 2021-12-25 MED ORDER — BUTALBITAL-APAP-CAFFEINE 50-325-40 MG PO TABS
2.0000 | ORAL_TABLET | Freq: Once | ORAL | Status: AC
Start: 2021-12-25 — End: 2021-12-25
  Administered 2021-12-25: 2 via ORAL
  Filled 2021-12-25: qty 2

## 2021-12-25 MED ORDER — HYDROMORPHONE HCL 1 MG/ML IJ SOLN
1.0000 mg | Freq: Once | INTRAMUSCULAR | Status: DC
  Filled 2021-12-25: qty 1

## 2021-12-25 MED ORDER — DOXYCYCLINE HYCLATE 100 MG PO TABS
100.0000 mg | ORAL_TABLET | Freq: Two times a day (BID) | ORAL | Status: DC
  Administered 2021-12-25 – 2021-12-29 (×10): 100 mg via ORAL
  Filled 2021-12-25 (×10): qty 1

## 2021-12-25 MED ORDER — INSULIN ASPART 100 UNIT/ML IJ SOLN
0.0000 [IU] | Freq: Three times a day (TID) | INTRAMUSCULAR | Status: DC
  Administered 2021-12-25 – 2021-12-26 (×3): 3 [IU] via SUBCUTANEOUS
  Administered 2021-12-28 – 2021-12-30 (×3): 2 [IU] via SUBCUTANEOUS

## 2021-12-25 MED ORDER — GUAIFENESIN ER 600 MG PO TB12
600.0000 mg | ORAL_TABLET | Freq: Two times a day (BID) | ORAL | Status: DC
  Administered 2021-12-25 – 2022-01-03 (×18): 600 mg via ORAL
  Filled 2021-12-25 (×19): qty 1

## 2021-12-25 MED ORDER — CAFFEINE-SODIUM BENZOATE 125-125 MG/ML IJ SOLN
250.0000 mg | Freq: Once | INTRAMUSCULAR | Status: DC
  Filled 2021-12-25: qty 1

## 2021-12-25 MED ORDER — OXYCODONE HCL 5 MG PO TABS
10.0000 mg | ORAL_TABLET | ORAL | Status: DC | PRN
  Administered 2021-12-25 – 2021-12-27 (×7): 10 mg via ORAL
  Filled 2021-12-25 (×7): qty 2

## 2021-12-25 NOTE — Progress Notes (Signed)
Neurology Progress Note   S:// Reports no improvement of her headache.  Notably states that it is 8-9/10 laying down but becomes absolutely intolerable on position change sitting up. She is also complaining of worsening sputum production with cough Full review of systems otherwise negative for any new complaints  O:// Current vital signs: BP (!) 126/54 (BP Location: Right Arm)   Pulse 61   Temp 97.9 F (36.6 C) (Oral)   Resp 18   Ht 5\' 2"  (1.575 m)   Wt 60.2 kg   SpO2 94%   BMI 24.27 kg/m  Vital signs in last 24 hours: Temp:  [97.4 F (36.3 C)-97.9 F (36.6 C)] 97.9 F (36.6 C) (09/04 0717) Pulse Rate:  [49-65] 61 (09/04 0717) Resp:  [15-18] 18 (09/04 0717) BP: (110-126)/(51-60) 126/54 (09/04 0717) SpO2:  [94 %-100 %] 94 % (09/04 0717) Weight:  [60.2 kg] 60.2 kg (09/04 0500)  GENERAL: Awake, alert in NAD HEENT: - Normocephalic and atraumatic LUNGS -breathing comfortably with no audible wheezing CV -perfusing extremities well ABDOMEN - nontender, nondistended   NEURO:  Mental Status: AA&Ox3  Language: speech is clear.  Naming, repetition, fluency, and comprehension intact. Cranial Nerves: PERRL. EOMI, visual fields full, no facial asymmetry, hearing intact, tongue/uvula/soft palate midline,  Motor: Pronator drift of the left upper extremity Sensation- Intact to light touch bilaterally Gait- deferred, acutely symptomatic with headache worsening when transitioning to seated from laying down   Medications  Current Facility-Administered Medications:    acetaminophen (TYLENOL) tablet 650 mg, 650 mg, Oral, Q6H PRN, 650 mg at 12/24/21 0331 **OR** acetaminophen (TYLENOL) suppository 650 mg, 650 mg, Rectal, Q6H PRN, 02/23/22, DO   aspirin EC tablet 81 mg, 81 mg, Oral, Daily, Carollee Herter, DO, 81 mg at 12/24/21 0836   donepezil (ARICEPT) tablet 10 mg, 10 mg, Oral, QHS, 02/23/22, DO, 10 mg at 12/24/21 2127   guaiFENesin-dextromethorphan (ROBITUSSIN DM) 100-10 MG/5ML syrup 10  mL, 10 mL, Oral, Q4H PRN, 2128, DO   ipratropium-albuterol (DUONEB) 0.5-2.5 (3) MG/3ML nebulizer solution 3 mL, 3 mL, Nebulization, Q6H PRN, Carollee Herter, DO   levothyroxine (SYNTHROID) tablet 88 mcg, 88 mcg, Oral, QAC breakfast, Carollee Herter, DO, 88 mcg at 12/25/21 0609   methylPREDNISolone sodium succinate (SOLU-MEDROL) 1,000 mg in sodium chloride 0.9 % 50 mL IVPB, 1,000 mg, Intravenous, Q24H, 0610, Denise A, NP   montelukast (SINGULAIR) tablet 10 mg, 10 mg, Oral, QHS, Chen, Eric, DO, 10 mg at 12/24/21 2127   ondansetron (ZOFRAN) tablet 4 mg, 4 mg, Oral, Q6H PRN **OR** ondansetron (ZOFRAN) injection 4 mg, 4 mg, Intravenous, Q6H PRN, 2128, DO   Oral care mouth rinse, 15 mL, Mouth Rinse, PRN, Carollee Herter, Sharl Ma, MD   oxyCODONE-acetaminophen (PERCOCET/ROXICET) 5-325 MG per tablet 1 tablet, 1 tablet, Oral, Q4H PRN, Sarina Ill, DO, 1 tablet at 12/25/21 0609   pantoprazole (PROTONIX) EC tablet 40 mg, 40 mg, Oral, BID, 02/24/22, DO, 40 mg at 12/24/21 2127   pregabalin (LYRICA) capsule 50 mg, 50 mg, Oral, BID, 2128, DO, 50 mg at 12/24/21 2127   topiramate (TOPAMAX) tablet 200 mg, 200 mg, Oral, BID, 2128, DO, 200 mg at 12/24/21 2127 Labs   Basic Metabolic Panel: Recent Labs  Lab 12/22/21 2223 12/22/21 2228 12/25/21 0341  NA 143 143 141  K 4.2 4.1 3.9  CL 107 110 110  CO2 22  --  25  GLUCOSE 127* 124* 121*  BUN 28* 28* 32*  CREATININE 1.50* 1.50* 1.29*  CALCIUM  9.3  --  8.9    CBC: Recent Labs  Lab 12/22/21 2223 12/22/21 2228 12/25/21 0341  WBC 15.7*  --  13.3*  NEUTROABS 11.5*  --   --   HGB 13.1 13.9 11.5*  HCT 41.2 41.0 36.2  MCV 89.8  --  87.9  PLT 213  --  214    Coagulation Studies: No results for input(s): "LABPROT", "INR" in the last 72 hours.  Lab Results  Component Value Date   ESRSEDRATE 57 (H) 12/23/2021   Lab Results  Component Value Date   CRP 21.3 (H) 12/23/2021      Lab Results  Component Value Date   HGBA1C 6.0 (H) 08/09/2021      Lipid Panel     Component Value Date/Time   CHOL 148 05/03/2021 0538   TRIG 132 05/03/2021 0538   HDL 24 (L) 05/03/2021 0538   CHOLHDL 6.2 05/03/2021 0538   VLDL 26 05/03/2021 0538   LDLCALC 98 05/03/2021 0538     Imaging I have reviewed images in epic and the results pertinent to this consultation are:  CT-scan of the brain 1. No acute intracranial pathology. 2. Old right MCA territory infarct and encephalomalacia. 3. Paranasal sinus disease and right mastoid effusion.     MRI examination of the brain 1. No acute intracranial abnormality. 2. 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  [place] bilateral temporal - Absence of a "halo" sign in the bilateral temporal artery, although not  definitive, makes a diagnosis of temporal arteritis   Assessment:  76 yo WF with hx of afib, on xarelto, hx of CVA with residual right sided weakness, hx of cluster HA and migraines, CKD stage 3a, COPD, pulmonary fibrosis, seizure disorder, presents to ER with bitemporal headache today.  No vision changes per patient, but there was concern for visual change on presentation from the initial neurologist and so she was started on solumedrol. Pt on topamax and lyrica for hx of seizures and headaches. Pt took a left over percocet with improvement in headache. Due to severity of HA, pt presented to ER.  Today the headache history she provides is notable for a significant positional component with worsening on sitting up and with Valsalva.  This is concerning for low pressure headache.  Will obtain imaging to assess for Tarlov cysts.  Appreciate primary team's evaluation of her cough and sputum production which might explain her elevated inflammatory markers.  Last dose of steroids is already in progress and will finish this dose for today but hold off on further steroids for now   Impression: Bitemporal headache -- prior concern for giant cell  arteritis but not responsive to steroids Consider CSF leak headache  Recommendations: - High dose steroids 1gm solumedrol IV for a total of 3 doses (day 3/3); consider 60 mg daily steroids starting 9/5 pending further workup (hold off for now) - continue PPI while on steroids  - monitor blood glucose  - MRI T and L-spine to assess for Tarlov cysts or other cause of low pressure headache - 250 mg IV caffeine and cautious 250 mL bolus of normal saline for possible low pressure headache treatment, will monitor response - patient was also given Fioricet by primary team, 2 tablets; please hold off on further Fioricet for now - We will reconsider whether temporal artery biopsy is needed pending MRI results - Neurology will continue to follow  - Discussed with Dr. Darrick Meigs in person  Lesleigh Noe MD-PhD  [facility] [phone] Available 7 AM to 7 PM, outside these hours please contact Neurologist on call listed on AMION

## 2021-12-25 NOTE — Progress Notes (Signed)
[facility]  Subjective  -still having bitemporal headaches.   Objective (!) 121/50 62 97.8 F (36.6 C) (Oral) 14 93%  Intake/Output Summary (Last 24 hours) at 12/25/2021 1123 Last data filed at 12/25/2021 1107 Gross per 24 hour  Intake 360 ml  Output 800 ml  Net -440 ml    Bilateral temporal pulses are palpable  Laboratory Lab Results: Recent Labs    12/22/21 2223 12/22/21 2228 12/25/21 0341  WBC 15.7*  --  13.3*  HGB 13.1 13.9 11.5*  HCT 41.2 41.0 36.2  PLT 213  --  214   BMET Recent Labs    12/22/21 2223 12/22/21 2228 12/25/21 0341  NA 143 143 141  K 4.2 4.1 3.9  CL 107 110 110  CO2 22  --  25  GLUCOSE 127* 124* 121*  BUN 28* 28* 32*  CREATININE 1.50* 1.50* 1.29*  CALCIUM 9.3  --  8.9    COAG Lab Results  Component Value Date   INR 1.4 (H) 11/01/2021   INR 1.4 (H) 07/01/2021   INR 1.4 (H) 05/01/2021   No results found for: "PTT"  Assessment/Planning:  76 year old female with multiple medical problems admitted with bitemporal headache.  Vascular surgery was consulted yesterday for temporal artery biopsy.  Had multiple discussions with hospitalist and neurology as to which side we should biopsy given the unusual presentation.  Ultimately she is posted for left temporal artery biopsy on Wednesday.  Now neurology is recommending MRI and other work-up given no improvement on steroids and will evaluate whether temporal biopsy is needed.  We will keep her on the schedule for Wednesday for left temporal artery biopsy tentatively.  Please continue to hold her DOAC.  Cephus Shelling 12/25/2021 11:23 AM --

## 2021-12-25 NOTE — Progress Notes (Signed)
  Transition of Care [facility]) Screening Note   Patient Details  Name: Jenny Kelly Date of Birth: Aug 31, 1945   Transition of Care [facility]) CM/SW Contact:    Baldemar Lenis, LCSW Phone Number: 12/25/2021, 8:47 AM    Transition of Care Department [facility]) has reviewed patient, medical workup ongoing. We will continue to monitor patient advancement through interdisciplinary progression rounds. If new patient transition needs arise, please place a TOC consult.

## 2021-12-25 NOTE — Progress Notes (Signed)
[facility]  PROGRESS NOTE  Jenny Kelly EXN:[id number] DOB: 1945/04/24 DOA: 12/22/2021 PCP: Jenny Roger, MD   Brief HPI:    76 year old female with a history of atrial fibrillation on Xarelto, history of CVA with residual right-sided weakness, history of cluster headaches and migraine, CKD stage IIIa, COPD, pulmonary fibrosis, seizure disorder came to ED with bitemporal headache.  This was associated with photophobia.  No vision changes.  No nausea or vomiting.  Patient is on Topamax and Lyrica for history of seizures and headache. In the ED ESR and CRP were elevated.  Neurology was consulted.  MRI brain was negative, CT head was negative.   Subjective   Patient still complains of headache especially when she sits up from laying down position.  Xarelto is currently on hold for anticipated temporal artery biopsy on Wednesday per vascular surgery.   Assessment/Plan:   Bitemporal headache -Due to elevated ESR and CRP there was concern for temporal arteritis -Ultrasound of the temporal artery obtained which was negative for halo sign -Neurology following -Patient is received 3 days of IV Solu-Medrol 1 g daily -May need temporal artery biopsy after 3 days of IV Solu-Medrol -Vascular surgery has tentatively plan for temporal artery biopsy on Wednesday -Neurology has ordered MRI of thoracic and lumbar spine for Tarlov cysts or other cause of low pressure headache -Also patient given to milligram of IV caffeine with 250 mill bolus of normal saline for low pressure headache treatment  Acute bronchitis/pulmonary fibrosis -Patient has history of pulmonary fibrosis, currently not on oxygen -Started coughing up green-colored phlegm yesterday -She does have bibasilar crackles -We will start on doxycycline 100 mg p.o. twice daily, flutter valve, Mucinex -Check sputum culture   History of CVA with residual deficit Chronic.   Chronic respiratory failure with hypoxia (HCC) Stable.    PAF (paroxysmal atrial fibrillation) (HCC) Xarelto on hold for anticipated temporal artery biopsy on Wednesday   Stage 3a chronic kidney disease (CKD) (HCC) Stable. Creatinine 1.29 today   (HFpEF) heart failure with preserved ejection fraction (HCC) Stable. Euvolemic.   DNR (do not resuscitate) Stable.   COPD (chronic obstructive pulmonary disease) (HCC) Stable. Prn duonebs.        Medications     aspirin EC  81 mg Oral Daily   caffeine-sodium benzoate ADULT  250 mg Intravenous Once   donepezil  10 mg Oral QHS   doxycycline  100 mg Oral Q12H   guaiFENesin  600 mg Oral BID   insulin aspart  0-15 Units Subcutaneous TID WC   levothyroxine  88 mcg Oral QAC breakfast   montelukast  10 mg Oral QHS   pantoprazole  40 mg Oral BID   pregabalin  50 mg Oral BID   topiramate  200 mg Oral BID     Data Reviewed:   CBG:  Recent Labs  Lab 12/25/21 0901  GLUCAP 113*    SpO2: 94 %    Vitals:   12/24/21 1936 12/25/21 0428 12/25/21 0500 12/25/21 0717  BP: (!) 117/53 (!) 111/55  (!) 126/54  Pulse: (!) 49 65  61  Resp: $Remo'16 16  18  'hMRuy$ Temp: (!) 97.4 F (36.3 C) 97.8 F (36.6 C)  97.9 F (36.6 C)  TempSrc: Oral Oral  Oral  SpO2: 97% 96%  94%  Weight:   60.2 kg   Height:          Data Reviewed:  Basic Metabolic Panel: Recent Labs  Lab 12/22/21 2223 12/22/21 2228 12/25/21 0341  NA 143 143 141  K 4.2 4.1 3.9  CL 107 110 110  CO2 22  --  25  GLUCOSE 127* 124* 121*  BUN 28* 28* 32*  CREATININE 1.50* 1.50* 1.29*  CALCIUM 9.3  --  8.9    CBC: Recent Labs  Lab 12/22/21 2223 12/22/21 2228 12/25/21 0341  WBC 15.7*  --  13.3*  NEUTROABS 11.5*  --   --   HGB 13.1 13.9 11.5*  HCT 41.2 41.0 36.2  MCV 89.8  --  87.9  PLT 213  --  214    LFT Recent Labs  Lab 12/22/21 2223  AST 25  ALT 9  ALKPHOS 81  BILITOT 0.5  PROT 6.9  ALBUMIN 2.7*     Antibiotics: Anti-infectives (From admission, onward)    Start     Dose/Rate Route Frequency Ordered Stop    12/25/21 1015  doxycycline (VIBRA-TABS) tablet 100 mg        100 mg Oral Every 12 hours 12/25/21 0921          DVT prophylaxis: Xarelto  Code Status: DNR  Family Communication: Discussed with husband at bedside   CONSULTS neurology   Objective    Physical Examination:   General-appears in no acute distress Heart-S1-S2, regular, no murmur auscultated Lungs-bibasilar crackles auscultated Abdomen-soft, nontender, no organomegaly Extremities-no edema in the lower extremities Neuro-alert, oriented x3, no focal deficit noted   Status is: Inpatient:             [name]   [facility] If 7PM-7AM, please contact night-coverage at [email or web address], Office  [phone]   12/25/2021, 10:06 AM  LOS: 1 day

## 2021-12-26 ENCOUNTER — Inpatient Hospital Stay (HOSPITAL_COMMUNITY): Payer: Medicare HMO

## 2021-12-26 DIAGNOSIS — I5032 Chronic diastolic (congestive) heart failure: Secondary | ICD-10-CM | POA: Diagnosis not present

## 2021-12-26 DIAGNOSIS — I480 Paroxysmal atrial fibrillation: Secondary | ICD-10-CM | POA: Diagnosis not present

## 2021-12-26 DIAGNOSIS — I6930 Unspecified sequelae of cerebral infarction: Secondary | ICD-10-CM | POA: Diagnosis not present

## 2021-12-26 DIAGNOSIS — R519 Headache, unspecified: Secondary | ICD-10-CM | POA: Diagnosis not present

## 2021-12-26 LAB — GLUCOSE, CAPILLARY
Glucose-Capillary: 89 mg/dL (ref 70–99)
Glucose-Capillary: 185 mg/dL — ABNORMAL HIGH (ref 70–99)
Glucose-Capillary: 71 mg/dL (ref 70–99)
Glucose-Capillary: 81 mg/dL (ref 70–99)

## 2021-12-26 MED ORDER — CAFFEINE 200 MG PO TABS
200.0000 mg | ORAL_TABLET | ORAL | Status: DC | PRN
  Filled 2021-12-26: qty 1

## 2021-12-26 MED ORDER — SODIUM CHLORIDE 0.9 % IV BOLUS: 1000.0000 mL | Freq: Once | INTRAVENOUS | Status: DC

## 2021-12-26 MED ORDER — SODIUM CHLORIDE 0.9 % IV BOLUS
1000.0000 mL | Freq: Once | INTRAVENOUS | Status: AC
  Administered 2021-12-26: 1000 mL via INTRAVENOUS

## 2021-12-26 NOTE — Plan of Care (Signed)

## 2021-12-26 NOTE — Progress Notes (Signed)
Neurology Progress Note   S:// Reports her sputum production is improving but she is having continued severe positional headache To focused on immediate pain relief to discuss etiology or plan going forward Plan discussed with husband via phone and son at bedside in detail  O:// Current vital signs: BP (!) 118/53 (BP Location: Left Arm)   Pulse 70   Temp 98 F (36.7 C) (Oral)   Resp 16   Ht 5\' 2"  (1.575 m)   Wt 60.1 kg   SpO2 93%   BMI 24.23 kg/m  Vital signs in last 24 hours: Temp:  [97.7 F (36.5 C)-98.7 F (37.1 C)] 98 F (36.7 C) (09/05 1504) Pulse Rate:  [50-70] 70 (09/05 1504) Resp:  [14-18] 16 (09/05 1504) BP: (117-141)/(36-67) 118/53 (09/05 1504) SpO2:  [93 %-97 %] 93 % (09/05 1504) Weight:  [60.1 kg] 60.1 kg (09/05 0437)  GENERAL: Awake, alert in NAD HEENT: - Normocephalic and atraumatic.  Worsening of headache with head movement LUNGS -breathing comfortably with no audible wheezing CV -perfusing extremities well ABDOMEN - nontender, nondistended   NEURO:  Mental Status: AA&Ox3  Language: speech is clear.  Naming, repetition, fluency, and comprehension intact. Cranial Nerves: PERRL she does have some photophobia. EOMI, visual fields full, no facial asymmetry, hearing intact, tongue/uvula/soft palate midline,  Motor: Pronator drift of the left upper extremity Sensation- Intact to light touch bilaterally Gait- deferred, acutely symptomatic with headache worsening when transitioning to seated from laying down   Medications  Current Facility-Administered Medications:    acetaminophen (TYLENOL) tablet 650 mg, 650 mg, Oral, Q6H PRN, 650 mg at 12/25/21 1632 **OR** acetaminophen (TYLENOL) suppository 650 mg, 650 mg, Rectal, Q6H PRN, 02/24/22, DO   aspirin EC tablet 81 mg, 81 mg, Oral, Daily, Carollee Herter, DO, 81 mg at 12/26/21 02/25/22   caffeine tablet 200 mg, 200 mg, Oral, Q4H PRN, Thadeus Gandolfi L, MD   donepezil (ARICEPT) tablet 10 mg, 10 mg, Oral, QHS, 5053, DO, 10 mg at 12/25/21 2208   doxycycline (VIBRA-TABS) tablet 100 mg, 100 mg, Oral, Q12H, 2209, Gagan S, MD, 100 mg at 12/26/21 02/25/22   guaiFENesin (MUCINEX) 12 hr tablet 600 mg, 600 mg, Oral, BID, 9767, Gagan S, MD, 600 mg at 12/26/21 02/25/22   HYDROmorphone (DILAUDID) injection 1 mg, 1 mg, Intravenous, Once, 3419, [place], MD   insulin aspart (novoLOG) injection 0-15 Units, 0-15 Units, Subcutaneous, TID WC, Amarii Amy L, MD, 3 Units at 12/26/21 1243   ipratropium-albuterol (DUONEB) 0.5-2.5 (3) MG/3ML nebulizer solution 3 mL, 3 mL, Nebulization, Q6H PRN, 02/25/22, DO   levothyroxine (SYNTHROID) tablet 88 mcg, 88 mcg, Oral, QAC breakfast, Carollee Herter, DO, 88 mcg at 12/26/21 02/25/22   montelukast (SINGULAIR) tablet 10 mg, 10 mg, Oral, QHS, 3790, DO, 10 mg at 12/25/21 2208   ondansetron (ZOFRAN) tablet 4 mg, 4 mg, Oral, Q6H PRN **OR** ondansetron (ZOFRAN) injection 4 mg, 4 mg, Intravenous, Q6H PRN, 2209, DO   Oral care mouth rinse, 15 mL, Mouth Rinse, PRN, Carollee Herter, Sharl Ma, MD   oxyCODONE (Oxy IR/ROXICODONE) immediate release tablet 10 mg, 10 mg, Oral, Q4H PRN, Sarina Ill, Gagan S, MD, 10 mg at 12/26/21 1243   pantoprazole (PROTONIX) EC tablet 40 mg, 40 mg, Oral, BID, 02/25/22, DO, 40 mg at 12/26/21 02/25/22   pregabalin (LYRICA) capsule 50 mg, 50 mg, Oral, BID, 2409, DO, 50 mg at 12/26/21 02/25/22   topiramate (TOPAMAX) tablet 200 mg, 200 mg, Oral, BID, 7353, DO,  200 mg at 12/26/21 0828 Labs   Basic Metabolic Panel: Recent Labs  Lab 12/22/21 2223 12/22/21 2228 12/25/21 0341  NA 143 143 141  K 4.2 4.1 3.9  CL 107 110 110  CO2 22  --  25  GLUCOSE 127* 124* 121*  BUN 28* 28* 32*  CREATININE 1.50* 1.50* 1.29*  CALCIUM 9.3  --  8.9     CBC: Recent Labs  Lab 12/22/21 2223 12/22/21 2228 12/25/21 0341  WBC 15.7*  --  13.3*  NEUTROABS 11.5*  --   --   HGB 13.1 13.9 11.5*  HCT 41.2 41.0 36.2  MCV 89.8  --  87.9  PLT 213  --  214     Coagulation Studies: No results  for input(s): "LABPROT", "INR" in the last 72 hours.  Lab Results  Component Value Date   ESRSEDRATE 57 (H) 12/23/2021   Lab Results  Component Value Date   CRP 21.3 (H) 12/23/2021      Lab Results  Component Value Date   HGBA1C 6.0 (H) 08/09/2021     Lipid Panel     Component Value Date/Time   CHOL 148 05/03/2021 0538   TRIG 132 05/03/2021 0538   HDL 24 (L) 05/03/2021 0538   CHOLHDL 6.2 05/03/2021 0538   VLDL 26 05/03/2021 0538   LDLCALC 98 05/03/2021 0538     Imaging I have reviewed images in epic and the results pertinent to this consultation are:  CT-scan of the brain 1. No acute intracranial pathology. 2. Old right MCA territory infarct and encephalomalacia. 3. Paranasal sinus disease and right mastoid effusion.     MRI examination of the brain 1. No acute intracranial abnormality. 2. 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  [place] bilateral temporal - Absence of a "halo" sign in the bilateral temporal artery, although not  definitive, makes a diagnosis of temporal arteritis   MR THORACIC SPINE IMPRESSION   No acute findings in the thoracic spine.  No significant stenosis.   MR LUMBAR SPINE IMPRESSION   Multilevel degenerative changes of the lumbar spine, worst from L3 through S1, or some was below:   L3-L4: Mild spinal canal and bilateral subarticular stenosis. Moderate left and mild-to-moderate right neural foraminal stenosis.   L4-L5: Severe right neural foraminal stenosis likely impinging the exiting nerve root. Moderate right and subarticular stenosis encroaching the descending L5 nerve root. Mild left subarticular and moderate left neural foraminal stenosis.   L5-S1: Degenerative grade 1 anterolisthesis. Moderate left and mild-to-moderate right neural foraminal stenosis.  There are small perineural cysts on the left at T7-T8 and T8-T9 and on the right at T9-T10. Tiny right-sided  perineural cyst at S2.  Assessment:  76 yo WF with hx of afib, on xarelto, hx of CVA with residual right sided weakness, hx of cluster HA and migraines, CKD stage 3a, COPD, pulmonary fibrosis, seizure disorder, presented to ER with bitemporal headache today.  Initial concern was for possible GCA given her elevated inflammatory markers, and she was treated with 3 days of pulse dose steroids (9/2-9/4) without improvement in her headache.  On observation her headache has a severe significant positional quality highly concerning for CSF leak headache, but no etiology has been identified on MRI T or L-spine  Impression: Bitemporal headache -- prior concern for giant cell arteritis but not responsive to steroids Consider CSF leak headache  Recommendations: -MRI brain with and without contrast -Will attempt CT myelogram tomorrow -Will discuss with [facility]  for possible transfer for expertise of specialists there -1 L normal saline bolus ordered, and discussed with primary team they will titrate fluids given her history of heart failure -Would not pursue temporal artery biopsy at this time -Neurology will continue to follow  -Discussed with Dr. Sharl Ma in person  Brooke Dare MD-PhD [facility] [phone] Available 7 AM to 7 PM, outside these hours please contact Neurologist on call listed on AMION   Greater than 50 minutes were spent in care of this patient today, greater than 50% at bedside and discussion with family and Dr. Sharl Ma

## 2021-12-26 NOTE — Progress Notes (Signed)
[facility]  PROGRESS NOTE  Jenny Kelly NQN:[id number] DOB: [birth year] DOA: 12/22/2021 PCP: Crist Fat, MD   Brief HPI:    76 year old female with a history of atrial fibrillation on Xarelto, history of CVA with residual right-sided weakness, history of cluster headaches and migraine, CKD stage IIIa, COPD, pulmonary fibrosis, seizure disorder came to ED with bitemporal headache.  This was associated with photophobia.  No vision changes.  No nausea or vomiting.  Patient is on Topamax and Lyrica for history of seizures and headache. In the ED ESR and CRP were elevated.  Neurology was consulted.  MRI brain was negative, CT head was negative. Due to elevated CRP and ESR and bitemporal headache, temporal arteritis was considered.  Temporal artery ultrasound did not show Hello sign.  Vascular surgery was consulted for possible temporal artery biopsy.  Xarelto is currently on hold in anticipation for biopsy on Wednesday.  Neurology also considering differential as low pressure headache.   Subjective   Patient seen and examined, still complains of headache.  No improvement with IV Solu-Medrol.  MRI brain was unremarkable.  MRI thoracic and lumbar spine shows significant changes with spinal stenosis and spondylolisthesis.  No acute issues noted on MRI   Assessment/Plan:   Bitemporal headache -Due to elevated ESR and CRP there was concern for temporal arteritis -Ultrasound of the temporal artery obtained which was negative for halo sign -Neurology following -Patient is received 3 days of IV Solu-Medrol 1 g daily -May need temporal artery biopsy after 3 days of IV Solu-Medrol -Vascular surgery has tentatively plan for temporal artery biopsy on Wednesday -Neurology has ordered MRI of thoracic and lumbar spine for Tarlov cysts or other cause of low pressure headache -If no biopsy planned, vascular surgery will be informed before tomorrow morning.  Acute bronchitis/pulmonary  fibrosis -Patient has history of pulmonary fibrosis, currently not on oxygen -Started coughing up green-colored phlegm yesterday -She does have bibasilar crackles -Improved after starting on doxycycline 100 mg p.o. twice daily, flutter valve, Mucinex -Follow sputum culture   History of CVA with residual deficit Chronic.   Chronic respiratory failure with hypoxia (HCC) Stable.   PAF (paroxysmal atrial fibrillation) (HCC) Xarelto on hold for anticipated temporal artery biopsy on Wednesday   Stage 3a chronic kidney disease (CKD) (HCC) Stable. Creatinine 1.29 today   (HFpEF) heart failure with preserved ejection fraction (HCC) Stable. Euvolemic.   DNR (do not resuscitate) Stable.   COPD (chronic obstructive pulmonary disease) (HCC) Stable. Prn duonebs.    Medications     aspirin EC  81 mg Oral Daily   donepezil  10 mg Oral QHS   doxycycline  100 mg Oral Q12H   guaiFENesin  600 mg Oral BID    HYDROmorphone (DILAUDID) injection  1 mg Intravenous Once   insulin aspart  0-15 Units Subcutaneous TID WC   levothyroxine  88 mcg Oral QAC breakfast   montelukast  10 mg Oral QHS   pantoprazole  40 mg Oral BID   pregabalin  50 mg Oral BID   topiramate  200 mg Oral BID     Data Reviewed:   CBG:  Recent Labs  Lab 12/25/21 1133 12/25/21 1631 12/25/21 1929 12/26/21 0621 12/26/21 1108  GLUCAP 152* 198* 217* 89 185*    SpO2: 95 %    Vitals:   12/26/21 0410 12/26/21 0437 12/26/21 0739 12/26/21 1104  BP: (!) 141/61  132/67 122/61  Pulse: 65  64 63  Resp: 18  14 14   Temp:  97.7 F (36.5 C)  98.1 F (36.7 C) 98.3 F (36.8 C)  TempSrc: Oral  Oral Oral  SpO2: 97%  97% 95%  Weight:  60.1 kg    Height:          Data Reviewed:  Basic Metabolic Panel: Recent Labs  Lab 12/22/21 2223 12/22/21 2228 12/25/21 0341  NA 143 143 141  K 4.2 4.1 3.9  CL 107 110 110  CO2 22  --  25  GLUCOSE 127* 124* 121*  BUN 28* 28* 32*  CREATININE 1.50* 1.50* 1.29*  CALCIUM 9.3   --  8.9    CBC: Recent Labs  Lab 12/22/21 2223 12/22/21 2228 12/25/21 0341  WBC 15.7*  --  13.3*  NEUTROABS 11.5*  --   --   HGB 13.1 13.9 11.5*  HCT 41.2 41.0 36.2  MCV 89.8  --  87.9  PLT 213  --  214    LFT Recent Labs  Lab 12/22/21 2223  AST 25  ALT 9  ALKPHOS 81  BILITOT 0.5  PROT 6.9  ALBUMIN 2.7*     Antibiotics: Anti-infectives (From admission, onward)    Start     Dose/Rate Route Frequency Ordered Stop   12/25/21 1015  doxycycline (VIBRA-TABS) tablet 100 mg        100 mg Oral Every 12 hours 12/25/21 0921          DVT prophylaxis: Xarelto  Code Status: DNR  Family Communication: Discussed with husband at bedside   CONSULTS neurology   Objective    Physical Examination:   General-appears in no acute distress Heart-S1-S2, regular, no murmur auscultated Lungs-clear to auscultation bilaterally, no wheezing or crackles auscultated Abdomen-soft, nontender, no organomegaly Extremities-no edema in the lower extremities Neuro-alert, oriented x3, no focal deficit noted  Status is: Inpatient:        [name]   [facility] If 7PM-7AM, please contact night-coverage at [email or web address], Office  [phone]   12/26/2021, 11:43 AM  LOS: 2 days

## 2021-12-26 NOTE — Progress Notes (Addendum)
        Patient with B HA Pending left temporal artery biopsy 12/27/21  Cont. To hold DOAC NPO and consent placed Patient agrees to proceed  Mosetta Pigeon PA-C  I have seen and evaluated the patient. I agree with the PA note as documented above.  Discussed plan for left temporal artery biopsy tomorrow with Dr. Lenell Antu in the OR.  I realize that she is being reevaluated by neurology with MRI etc.  If this needs to be canceled please let vascular surgery know today.  Otherwise n.p.o. after midnight.  Consent ordered in the chart.  I discussed this with the patient this morning.  Cephus Shelling, MD [facility] Office: [phone]

## 2021-12-27 ENCOUNTER — Inpatient Hospital Stay (HOSPITAL_COMMUNITY): Payer: Medicare HMO

## 2021-12-27 ENCOUNTER — Encounter (HOSPITAL_COMMUNITY)
Admission: EM | Disposition: A | Discharge status: Home Health | Payer: Self-pay | Source: Home / Self Care | Attending: Internal Medicine

## 2021-12-27 DIAGNOSIS — R4182 Altered mental status, unspecified: Secondary | ICD-10-CM | POA: Diagnosis not present

## 2021-12-27 DIAGNOSIS — J9611 Chronic respiratory failure with hypoxia: Secondary | ICD-10-CM

## 2021-12-27 DIAGNOSIS — I5032 Chronic diastolic (congestive) heart failure: Secondary | ICD-10-CM | POA: Diagnosis not present

## 2021-12-27 DIAGNOSIS — R519 Headache, unspecified: Secondary | ICD-10-CM | POA: Diagnosis not present

## 2021-12-27 DIAGNOSIS — J449 Chronic obstructive pulmonary disease, unspecified: Secondary | ICD-10-CM | POA: Diagnosis not present

## 2021-12-27 HISTORY — PX: IR FLUORO GUIDED NEEDLE PLC ASPIRATION/INJECTION LOC: IMG2395

## 2021-12-27 LAB — CBC WITH DIFFERENTIAL/PLATELET
Abs Immature Granulocytes: 0.97 10*3/uL — ABNORMAL HIGH (ref 0.00–0.07)
Basophils Absolute: 0.2 10*3/uL — ABNORMAL HIGH (ref 0.0–0.1)
Basophils Relative: 1 %
Eosinophils Absolute: 0.1 10*3/uL (ref 0.0–0.5)
Eosinophils Relative: 1 %
HCT: 39 % (ref 36.0–46.0)
Hemoglobin: 12.6 g/dL (ref 12.0–15.0)
Immature Granulocytes: 5 %
Lymphocytes Relative: 17 %
Lymphs Abs: 3.7 10*3/uL (ref 0.7–4.0)
MCH: 27.8 pg (ref 26.0–34.0)
MCHC: 32.3 g/dL (ref 30.0–36.0)
MCV: 85.9 fL (ref 80.0–100.0)
Monocytes Absolute: 1.5 10*3/uL — ABNORMAL HIGH (ref 0.1–1.0)
Monocytes Relative: 7 %
Neutro Abs: 14.9 10*3/uL — ABNORMAL HIGH (ref 1.7–7.7)
Neutrophils Relative %: 69 %
Platelets: 223 10*3/uL (ref 150–400)
RBC: 4.54 MIL/uL (ref 3.87–5.11)
RDW: 16.8 % — ABNORMAL HIGH (ref 11.5–15.5)
WBC: 21.4 10*3/uL — ABNORMAL HIGH (ref 4.0–10.5)
nRBC: 0.1 % (ref 0.0–0.2)

## 2021-12-27 LAB — GLUCOSE, CAPILLARY
Glucose-Capillary: 79 mg/dL (ref 70–99)
Glucose-Capillary: 105 mg/dL — ABNORMAL HIGH (ref 70–99)
Glucose-Capillary: 127 mg/dL — ABNORMAL HIGH (ref 70–99)
Glucose-Capillary: 100 mg/dL — ABNORMAL HIGH (ref 70–99)

## 2021-12-27 LAB — COMPREHENSIVE METABOLIC PANEL
ALT: 11 U/L (ref 0–44)
AST: 12 U/L — ABNORMAL LOW (ref 15–41)
Albumin: 2.1 g/dL — ABNORMAL LOW (ref 3.5–5.0)
Alkaline Phosphatase: 81 U/L (ref 38–126)
Anion gap: 8 (ref 5–15)
BUN: 26 mg/dL — ABNORMAL HIGH (ref 8–23)
CO2: 21 mmol/L — ABNORMAL LOW (ref 22–32)
Calcium: 8.7 mg/dL — ABNORMAL LOW (ref 8.9–10.3)
Chloride: 114 mmol/L — ABNORMAL HIGH (ref 98–111)
Creatinine, Ser: 1.14 mg/dL — ABNORMAL HIGH (ref 0.44–1.00)
GFR, Estimated: 50 mL/min — ABNORMAL LOW (ref >60)
Glucose, Bld: 94 mg/dL (ref 70–99)
Potassium: 3.5 mmol/L (ref 3.5–5.1)
Sodium: 143 mmol/L (ref 135–145)
Total Bilirubin: 0.6 mg/dL (ref 0.3–1.2)
Total Protein: 6 g/dL — ABNORMAL LOW (ref 6.5–8.1)

## 2021-12-27 LAB — SEDIMENTATION RATE: Sed Rate: 60 mm/hr — ABNORMAL HIGH (ref 0–22)

## 2021-12-27 LAB — CSF CELL COUNT WITH DIFFERENTIAL
RBC Count, CSF: 2475 /mm3 — ABNORMAL HIGH
Tube #: 1
WBC, CSF: 4 /mm3 (ref 0–5)

## 2021-12-27 LAB — GLUCOSE, CSF: Glucose, CSF: 56 mg/dL (ref 40–70)

## 2021-12-27 LAB — SURGICAL PCR SCREEN
MRSA, PCR: NEGATIVE
Staphylococcus aureus: POSITIVE — AB

## 2021-12-27 LAB — PROTEIN, CSF: Total  Protein, CSF: 81 mg/dL — ABNORMAL HIGH (ref 15–45)

## 2021-12-27 LAB — C-REACTIVE PROTEIN: CRP: 22.7 mg/dL — ABNORMAL HIGH (ref <1.0)

## 2021-12-27 SURGERY — BIOPSY TEMPORAL ARTERY
Anesthesia: General | Laterality: Left

## 2021-12-27 MED ORDER — LIDOCAINE HCL (PF) 1 % IJ SOLN
5.0000 mL | Freq: Once | INTRAMUSCULAR | Status: AC
  Administered 2021-12-27: 3 mL

## 2021-12-27 MED ORDER — GADOBUTROL 1 MMOL/ML IV SOLN
6.0000 mL | Freq: Once | INTRAVENOUS | Status: AC | PRN
Start: 2021-12-27 — End: 2021-12-27
  Administered 2021-12-27: 6 mL via INTRAVENOUS

## 2021-12-27 MED ORDER — POLYETHYLENE GLYCOL 3350 17 G PO PACK: 17.0000 g | PACK | Freq: Two times a day (BID) | ORAL | Status: DC

## 2021-12-27 MED ORDER — HYDROMORPHONE HCL 1 MG/ML IJ SOLN
0.5000 mg | INTRAMUSCULAR | Status: AC | PRN
  Administered 2021-12-27 – 2021-12-28 (×3): 0.5 mg via INTRAVENOUS
  Filled 2021-12-27 (×3): qty 0.5

## 2021-12-27 MED ORDER — SODIUM CHLORIDE 0.9 % IV SOLN: INTRAVENOUS | Status: AC

## 2021-12-27 MED ORDER — SODIUM CHLORIDE 0.9 % IV BOLUS
500.0000 mL | Freq: Once | INTRAVENOUS | Status: AC
  Administered 2021-12-27: 500 mL via INTRAVENOUS

## 2021-12-27 MED ORDER — OXYCODONE HCL 5 MG PO TABS
5.0000 mg | ORAL_TABLET | ORAL | Status: DC | PRN
  Administered 2021-12-27 – 2022-01-04 (×17): 5 mg via ORAL
  Filled 2021-12-27 (×18): qty 1

## 2021-12-27 MED ORDER — CAFFEINE 200 MG PO TABS
200.0000 mg | ORAL_TABLET | ORAL | Status: DC
  Administered 2021-12-27 – 2021-12-30 (×18): 200 mg via ORAL
  Filled 2021-12-27 (×21): qty 1

## 2021-12-27 MED ORDER — CHLORHEXIDINE GLUCONATE CLOTH 2 % EX PADS
6.0000 | MEDICATED_PAD | Freq: Every day | CUTANEOUS | Status: AC
  Administered 2021-12-27 – 2021-12-31 (×5): 6 via TOPICAL

## 2021-12-27 MED ORDER — MUPIROCIN 2 % EX OINT
1.0000 | TOPICAL_OINTMENT | Freq: Two times a day (BID) | CUTANEOUS | Status: AC
  Administered 2021-12-27 – 2021-12-31 (×10): 1 via NASAL
  Filled 2021-12-27: qty 22

## 2021-12-27 MED ORDER — IOHEXOL 300 MG/ML  SOLN
10.0000 mL | Freq: Once | INTRAMUSCULAR | Status: AC | PRN
  Administered 2021-12-27: 10 mL via INTRATHECAL

## 2021-12-27 MED ORDER — LIDOCAINE HCL 1 % IJ SOLN
INTRAMUSCULAR | Status: AC
  Filled 2021-12-27: qty 20

## 2021-12-27 MED ORDER — POLYETHYLENE GLYCOL 3350 17 G PO PACK
17.0000 g | PACK | Freq: Every day | ORAL | Status: DC
  Administered 2021-12-28: 17 g via ORAL
  Filled 2021-12-27: qty 1

## 2021-12-27 NOTE — Care Management Important Message (Signed)
Important Message  Patient Details  Name: Jenny Kelly MRN: [id number] Date of Birth: 10/04/1945   Medicare Important Message Given:  Yes     Dorena Bodo 12/27/2021, 2:19 PM

## 2021-12-27 NOTE — Plan of Care (Signed)
Possible target for blood patch identified by Dr. Alfredo Batty who will attempt procedure for headache relief   Greatly appreciate his assistance.   Post procedure - Strict bedrest, SUPINE 24 hours post-procedure, minimize movement - Hydromorphone IV 0.5 mg x 3 doses q2hr PRN ordered for pain control to facilitate strict bedrest - Miralax starting tomorrow scheduled as patient does not seem to have a BM in many days (again goal is to avoid any straining)  Brooke Dare MD-PhD [facility] [phone]   No charge same day note

## 2021-12-27 NOTE — Progress Notes (Addendum)
Neurology Progress Note   S:// Still complaining of severe headache worse w/ valsalva and sitting up Delirious and somnolent from pain meds Has not been receiving any caffeine  Today reports sputum is still present but improved (yesterday reported had resolved)  Reports temporal tenderness resolved Reports C-spine pain from being moved for scans   O:// Current vital signs: BP 121/63 (BP Location: Right Arm)   Pulse (!) 101   Temp 98.6 F (37 C) (Oral)   Resp 16   Ht 5\' 2"  (1.575 m)   Wt 60.1 kg   SpO2 94%   BMI 24.23 kg/m  Vital signs in last 24 hours: Temp:  [97.8 F (36.6 C)-98.7 F (37.1 C)] 98.6 F (37 C) (09/06 0844) Pulse Rate:  [63-101] 101 (09/06 0844) Resp:  [14-20] 16 (09/06 0844) BP: (109-143)/(48-68) 121/63 (09/06 0844) SpO2:  [93 %-100 %] 94 % (09/06 0844) Weight:  [60.1 kg] 60.1 kg (09/06 0430)  GENERAL: Awake, alert in NAD HEENT: - Normocephalic and atraumatic.  Worsening of headache with head movement. No pain on palpation of sinuses or temples. Tenderness to palpation of C-spine LUNGS -breathing comfortably with no audible wheezing CV -perfusing extremities well ABDOMEN - nontender, nondistended   NEURO:  Mental Status: Sleepy, awakens briefly, reports she is at [facility], very perseverative on prior answers, poor attention / concentration Language: speech is slurred.  Language exam limited by pain and attention  Cranial Nerves: PERRL she does have some photophobia. EOMI, visual fields with left field cut, no facial asymmetry, hearing intact, tongue/uvula/soft palate midline,  Motor: Pronator drift of the left upper extremity > right upper extremity Sensation- Reporting reduced sensation to the LLE  Medications  Current Facility-Administered Medications:    acetaminophen (TYLENOL) tablet 650 mg, 650 mg, Oral, Q6H PRN, 650 mg at 12/25/21 1632 **OR** acetaminophen (TYLENOL) suppository 650 mg, 650 mg, Rectal, Q6H PRN, 02/24/22, DO   aspirin EC  tablet 81 mg, 81 mg, Oral, Daily, Carollee Herter, DO, 81 mg at 12/26/21 02/25/22   caffeine tablet 200 mg, 200 mg, Oral, Q4H PRN, Mikelle Myrick L, MD   Chlorhexidine Gluconate Cloth 2 % PADS 6 each, 6 each, Topical, Q0600, 4008, Sharl Ma, MD   donepezil (ARICEPT) tablet 10 mg, 10 mg, Oral, QHS, Chen, Eric, DO, 10 mg at 12/26/21 2245   doxycycline (VIBRA-TABS) tablet 100 mg, 100 mg, Oral, Q12H, 02/25/22, Gagan S, MD, 100 mg at 12/26/21 2245   guaiFENesin (MUCINEX) 12 hr tablet 600 mg, 600 mg, Oral, BID, 02/25/22, Gagan S, MD, 600 mg at 12/26/21 2245   HYDROmorphone (DILAUDID) injection 1 mg, 1 mg, Intravenous, Once, 02/25/22, [place], MD   insulin aspart (novoLOG) injection 0-15 Units, 0-15 Units, Subcutaneous, TID WC, Rumi Kolodziej L, MD, 3 Units at 12/26/21 1243   ipratropium-albuterol (DUONEB) 0.5-2.5 (3) MG/3ML nebulizer solution 3 mL, 3 mL, Nebulization, Q6H PRN, 02/25/22, DO   levothyroxine (SYNTHROID) tablet 88 mcg, 88 mcg, Oral, QAC breakfast, Carollee Herter, DO, 88 mcg at 12/27/21 0517   montelukast (SINGULAIR) tablet 10 mg, 10 mg, Oral, QHS, Chen, Eric, DO, 10 mg at 12/26/21 2245   mupirocin ointment (BACTROBAN) 2 % 1 Application, 1 Application, Nasal, BID, L[place], 02/25/22, MD   ondansetron (ZOFRAN) tablet 4 mg, 4 mg, Oral, Q6H PRN **OR** ondansetron (ZOFRAN) injection 4 mg, 4 mg, Intravenous, Q6H PRN, Sarina Ill, DO   Oral care mouth rinse, 15 mL, Mouth Rinse, PRN, Carollee Herter, Sharl Ma, MD   oxyCODONE (Oxy IR/ROXICODONE) immediate release  tablet 10 mg, 10 mg, Oral, Q4H PRN, Sharl Ma, Gagan S, MD, 10 mg at 12/27/21 0517   pantoprazole (PROTONIX) EC tablet 40 mg, 40 mg, Oral, BID, Carollee Herter, DO, 40 mg at 12/26/21 2245   pregabalin (LYRICA) capsule 50 mg, 50 mg, Oral, BID, Carollee Herter, DO, 50 mg at 12/26/21 2245   topiramate (TOPAMAX) tablet 200 mg, 200 mg, Oral, BID, Carollee Herter, DO, 200 mg at 12/26/21 2245 Labs   Basic Metabolic Panel: Recent Labs  Lab 12/22/21 2223 12/22/21 2228 12/25/21 0341 12/27/21 0824  NA  143 143 141 143  K 4.2 4.1 3.9 3.5  CL 107 110 110 114*  CO2 22  --  25 21*  GLUCOSE 127* 124* 121* 94  BUN 28* 28* 32* 26*  CREATININE 1.50* 1.50* 1.29* 1.14*  CALCIUM 9.3  --  8.9 8.7*    CBC: Recent Labs  Lab 12/22/21 2223 12/22/21 2228 12/25/21 0341 12/27/21 0824  WBC 15.7*  --  13.3* 21.4*  NEUTROABS 11.5*  --   --  14.9*  HGB 13.1 13.9 11.5* 12.6  HCT 41.2 41.0 36.2 39.0  MCV 89.8  --  87.9 85.9  PLT 213  --  214 223    Lab Results  Component Value Date   ESRSEDRATE 57 (H) 12/23/2021   Lab Results  Component Value Date   ESRSEDRATE 60 (H) 12/27/2021    Lab Results  Component Value Date   CRP 21.3 (H) 12/23/2021   Lab Results  Component Value Date   CRP 22.7 (H) 12/27/2021      Imaging I have reviewed images in epic and the results pertinent to this consultation are:  MRI brain w/ and w/o 9/6 personally reviewed, agree with radiology:   1. No acute intracranial process. No evidence of acute or subacute infarct. No abnormal parenchymal or meningeal enhancement. 2. No findings suggestive of spontaneous intracranial hypotension or CSF leak. No dural thickening/enhancement, enlargement of the pituitary gland, or expansion of the venous sinuses. 3. Air-fluid level in the right maxillary sinus, as can be seen with acute sinusitis. Correlate with symptoms.  CT-scan of the brain 1. No acute intracranial pathology. 2. Old right MCA territory infarct and encephalomalacia. 3. Paranasal sinus disease and right mastoid effusion.   MRI examination of the brain 9/2 1. No acute intracranial abnormality. 2. 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  [place] bilateral temporal - Absence of a "halo" sign in the bilateral temporal artery, although not  definitive, makes a diagnosis of temporal arteritis   MR THORACIC SPINE IMPRESSION   No acute findings in the thoracic spine.  No significant stenosis. Re-reviewed  with Neuroradiology today, in particular possible leak at the right T9 nerve root though this is not a definite finding   MR LUMBAR SPINE IMPRESSION   Multilevel degenerative changes of the lumbar spine, worst from L3 through S1, or some was below:   L3-L4: Mild spinal canal and bilateral subarticular stenosis. Moderate left and mild-to-moderate right neural foraminal stenosis.   L4-L5: Severe right neural foraminal stenosis likely impinging the exiting nerve root. Moderate right and subarticular stenosis encroaching the descending L5 nerve root. Mild left subarticular and moderate left neural foraminal stenosis.   L5-S1: Degenerative grade 1 anterolisthesis. Moderate left and mild-to-moderate right neural foraminal stenosis.  There are small perineural cysts on the left at T7-T8 and T8-T9 and on the right at T9-T10. Tiny right-sided perineural cyst at S2.  Assessment:  76 yo  WF with hx of afib, on xarelto, hx of CVA with residual right sided weakness, hx of cluster HA and migraines, CKD stage 3a, COPD, pulmonary fibrosis, seizure disorder, presented to ER with bitemporal headache today.  Initial concern was for possible GCA given her elevated inflammatory markers, and she was treated with 3 days of pulse dose steroids (9/2-9/4) without improvement in her headache.  On observation her headache has a severe significant positional quality highly concerning for CSF leak headache, but no etiology has been identified on MRI T or L-spine. MRI Brain not c/w CSF leak headache findings, but this is a poorly sensitive exam so will continue to workup for CSF leak headache.  Exam today notable for delirium; left sided weakness/numbness and field cut  Also unclear if worsening weakness is due to delirium, but out of caution will obtain EEG to eval for possible seizure  Impression: Bitemporal headache -- prior concern for giant cell arteritis but not responsive to steroids Consider CSF leak  headache  Recommendations: -CT myelogram today; if possible will try to obtain opening pressure, and basic CSF studies (cell counts x 2 tubes, protein, glucose) -Discussed with [facility] for possible transfer for expertise of specialists there -- no beds available and was declined -500 cc normal saline bolus ordered, please do continue fluids as tolerated -oxycodone weaned from 10 mg to 5 mg due to delirium and somnolence -caffeine scheduled with hold parameter for HR > 110 or BP > 180/105 -EEG for worsening left sided weakness / numbness  -Neurology will continue to follow  -Discussed with Dr. Aileen Fass and Dr. Jobe Igo as well as [facility] transfer center  Lesleigh Noe MD-PhD [facility] [phone] Available 7 AM to 7 PM, outside these hours please contact Neurologist on call listed on [place] than 50 minutes were spent in care of this patient today, greater than 50% at bedside and discussion with family and Dr. Darrick Meigs

## 2021-12-27 NOTE — Progress Notes (Signed)
Neurology note suggest not to pursue temporal artery biopsy at this time.  We have canceled her on the OR schedule today.  We will sign off.  Call if other questions or concerns.  Cephus Shelling, MD [facility] Office: [phone]   Cephus Shelling

## 2021-12-27 NOTE — Progress Notes (Signed)
EEG complete - results pending 

## 2021-12-27 NOTE — Procedures (Signed)
Patient Name: Jenny Kelly  MRN: [id number]  Epilepsy Attending: Charlsie Quest  Referring Physician/Provider: Gordy Councilman, MD  Date: 12/27/2021 Duration: 23.24 mins  Patient history: 76 yo WF with hx of afib, on xarelto, hx of CVA with residual right sided weakness, hx of cluster HA and migraines, CKD stage 3a, COPD, pulmonary fibrosis, seizure disorder, presented to ER with bitemporal headache today. EEG to evaluate for seizure  Level of alertness: Awake  AEDs during EEG study: TPM, PGB  Technical aspects: This EEG study was done with scalp electrodes positioned according to the 10-20 International system of electrode placement. Electrical activity was reviewed with band pass filter of 1-70Hz , sensitivity of 7 uV/mm, display speed of 83mm/sec with a 60Hz  notched filter applied as appropriate. EEG data were recorded continuously and digitally stored.  Video monitoring was available and reviewed as appropriate.  Description: No posterior dominant rhythm was seen. EEG showed continuous generalized 3 to 6 Hz theta-delta slowing. Generalized periodic discharges with triphasic morphology at 1 Hz were also noted intermittently. Hyperventilation and photic stimulation were not performed.      ABNORMALITY - Periodic discharges with triphasic morphology, generalized ( GPDs) - Continuous slow, generalized   IMPRESSION: This study showed generalized periodic discharges with triphasic morphology which is on the ictal-interictal continuum. However, the morphology and frequency is more commonly indicative of toxic-metabolic causes. If suspicion for interictal activity remains a concern, a prolonged study can be considered.    There is also moderate diffuse encephalopathy, nonspecific etiology. No seizures were seen throughout the recording.  Fidel Caggiano 

## 2021-12-27 NOTE — Progress Notes (Signed)
[facility] PROGRESS NOTE    Progress Note  Jenny Kelly  JSH:[id number] DOB: January 19, 1946 DOA: 12/22/2021 PCP: Townsend Roger, MD     Brief Narrative:   Jenny Kelly is an 76 y.o. female past medical history of atrial fibrillation on Xarelto, history of CVA with right residual weakness, migraines chronic kidney disease stage IIIa, pulmonary fibrosis seizure disorder came into the ED for bitemporal headaches associated with photophobia.  Patient is on Topamax and Lyrica for history of seizures and headache. In the ED, ESR and CRP were elevated neurology was consulted.  MRI of the brain and CT of the head were negative. Due to elevated markers and bitemporal headache temporal arteritis was considered ultrasound did not show halo sign vascular surgery was consulted for possible biopsy Xarelto is on hold for anticipation of biopsy on 12/27/2021.     Assessment/Plan:   Bitemporal headaches: Due to elevated ESR and CRP neurology was consulted and they were concerned for temporal arteritis. Ultrasound was negative. Prior concern was for giant cell arteritis but unresponsive to steroids.  Steroids have been stopped. They are considering CSF leak. MRI of the brain no suggestive findings for intracranial hypotension or CSF leak.  Air-fluid level at the maxillary sinus. Neurology recommended a CT myelogram tomorrow. They discussed with [facility] for possible transfer expertise specialty, but no bed available's and was declined. They recommended 500 cc bolus as tolerated. Continue oxycodone 5 mg which was decreased due to somnolence. Pain has been scheduled hold for heart rate greater than 110 and blood pressure greater than 180. EEG has been ordered. Further management per neurology.  Acute bronchitis/pulmonary fibrosis: Currently not on oxygen has basilar crackles continue Doxy flutter valve and Mucinex. Sputum cultures has been negative.  History of CVA with residual  deficits: Noted.  Paroxysmal atrial fibrillation: Xarelto on hold anticipating biopsy on 12/27/2021.  Chronic disease stage IIIa: Creatinine at baseline.  Chronic diastolic heart failure: Appears euvolemic and stable.  COPD: Stable.   DVT prophylaxis: SCD Family Communication:husband Status is: Inpatient Remains inpatient appropriate because: headache    Code Status:     Code Status Orders  (From admission, onward)           Start     Ordered   12/23/21 0811  Do not attempt resuscitation (DNR)  Continuous       Question Answer Comment  In the event of cardiac or respiratory ARREST Do not call a "code blue"   In the event of cardiac or respiratory ARREST Do not perform Intubation, CPR, defibrillation or ACLS   In the event of cardiac or respiratory ARREST Use medication by any route, position, wound care, and other measures to relive pain and suffering. May use oxygen, suction and manual treatment of airway obstruction as needed for comfort.      12/23/21 0810           Code Status History     Date Active Date Inactive Code Status Order ID Comments User Context   11/01/2021 0815 11/04/2021 1854 DNR [id number]  Norval Morton, MD ED   07/01/2021 0927 07/07/2021 1851 DNR [id number]  Karmen Bongo, MD ED   05/07/2021 1433 05/16/2021 1732 Partial Code [id number]  Charlett Blake, MD Inpatient   05/07/2021 1431 05/07/2021 1433 Full Code [id number]  Charlett Blake, MD Inpatient   05/01/2021 1755 05/07/2021 1357 Partial Code [id number] Intubation short term only if needed Germain Osgood, PA-C ED  IV Access:   Peripheral IV   Procedures and diagnostic studies:   MR BRAIN W WO CONTRAST  Result Date: 12/27/2021 CLINICAL DATA:  Concern for CSF leak, headache EXAM: MRI HEAD WITHOUT AND WITH CONTRAST TECHNIQUE: Multiplanar, multiecho pulse sequences of the brain and surrounding structures were obtained without and with intravenous contrast. CONTRAST:  21mL  GADAVIST GADOBUTROL 1 MMOL/ML IV SOLN COMPARISON:  12/23/2021 MRI head without contrast FINDINGS: Brain: No new restricted diffusion to suggest acute or subacute infarct. Redemonstrated diffusion signal abnormality in the right basal ganglia, unchanged from 12/23/2021 and most likely related to the prior infarct than reflecting additional acute infarcts. Redemonstrated encephalomalacia and gliosis involving the right MCA territory, including the right basal ganglia and insula. Redemonstrated hemosiderin staining in the regions, likely related to petechial hemorrhage. No new area of hemosiderin deposition. No acute hemorrhage, mass, mass effect, or midline shift. No hydrocephalus or extra-axial collection. No abnormal parenchymal or meningeal enhancement. Normal size of the pituitary. Normal craniocervical junction. Vascular: Normal arterial flow voids. Normal arterial and venous enhancement. No evidence of expansion of the distal transverse venous sinuses at the transverse-sigmoid junction. Skull and upper cervical spine: Normal marrow signal. Sinuses/Orbits: Small air-fluid level in the right maxillary sinus. Mild mucosal thickening in the ethmoid air cells. Status post bilateral lens replacements. Other: Fluid in the mastoid air cells. IMPRESSION: 1. No acute intracranial process. No evidence of acute or subacute infarct. No abnormal parenchymal or meningeal enhancement. 2. No findings suggestive of spontaneous intracranial hypotension or CSF leak. No dural thickening/enhancement, enlargement of the pituitary gland, or expansion of the venous sinuses. 3. Air-fluid level in the right maxillary sinus, as can be seen with acute sinusitis. Correlate with symptoms. Electronically Signed   By: Merilyn Baba M.D.   On: 12/27/2021 01:12   MR LUMBAR SPINE WO CONTRAST  Result Date: 12/25/2021 CLINICAL DATA:  Headache, sudden, severe concern for low pressure headache; Headache, sudden, severe Headache, new or worsening  (Age >= 50y) low pressure headache EXAM: MRI THORACIC AND LUMBAR SPINE WITHOUT CONTRAST TECHNIQUE: Multiplanar and multiecho pulse sequences of the thoracic and lumbar spine were obtained without intravenous contrast. COMPARISON:  CT abdomen pelvis 11/01/2021 FINDINGS: MRI THORACIC SPINE FINDINGS Alignment:  Trace degenerative anterolisthesis at C7-T1. Vertebrae: No fracture, evidence of discitis, or aggressive osseous lesion. Cord: No abnormal cord signal. Paraspinal and other soft tissues: There is increased signal in the lower lungs bilaterally and evidence of hilar adenopathy. Disc levels: There is mild disc bulging at T7-T8 and minimal right-sided disc bulging at T11-T12 and T12-L1. There is no significant spinal canal or neural foraminal stenosis at these or any other level in the thoracic spine. There are small perineural cysts on the left at T7-T8 and T8-T9 and on the right at T9-T10. MRI LUMBAR SPINE FINDINGS Segmentation:  Standard. Alignment:  Degenerative grade 1 anterolisthesis at L5-S1. Vertebrae: No fracture, evidence of discitis, or aggressive bone lesion. Conus medullaris and cauda equina: Conus extends to the L1 level. Conus and cauda equina appear normal. Paraspinal and other soft tissues: Paraspinal muscle atrophy. No other significant findings. Disc levels: L1-L2: Minimal disc bulging. Mild bilateral facet arthropathy. No significant stenosis. L2-L3: Mild disc bulging. Mild bilateral facet arthropathy. No significant stenosis. L3-L4: Small biforaminal disc bulging, ligamentum flavum hypertrophy and mild facet arthropathy results in mild spinal canal stenosis with bilateral subarticular stenosis, moderate left and mild-to-moderate right neural foraminal stenosis. L4-L5: Asymmetric right disc height loss and disc bulging with ligament flavum hypertrophy and bilateral  facet arthropathy results in moderate right subarticular and severe right neural foraminal stenosis. Mild left subarticular and  moderate neural foraminal stenosis. L5-S1: Grade 1 degenerative anterolisthesis with disc bulging and advanced bilateral facet arthropathy. No spinal canal stenosis. Moderate left and mild-to-moderate right neural foraminal stenosis. Tiny right-sided perineural cyst at S2. IMPRESSION: MR THORACIC SPINE IMPRESSION No acute findings in the thoracic spine.  No significant stenosis. MR LUMBAR SPINE IMPRESSION Multilevel degenerative changes of the lumbar spine, worst from L3 through S1, or some was below: L3-L4: Mild spinal canal and bilateral subarticular stenosis. Moderate left and mild-to-moderate right neural foraminal stenosis. L4-L5: Severe right neural foraminal stenosis likely impinging the exiting nerve root. Moderate right and subarticular stenosis encroaching the descending L5 nerve root. Mild left subarticular and moderate left neural foraminal stenosis. L5-S1: Degenerative grade 1 anterolisthesis. Moderate left and mild-to-moderate right neural foraminal stenosis. Electronically Signed   By: Maurine Simmering M.D.   On: 12/25/2021 15:57   MR THORACIC SPINE WO CONTRAST  Result Date: 12/25/2021 CLINICAL DATA:  Headache, sudden, severe concern for low pressure headache; Headache, sudden, severe Headache, new or worsening (Age >= 50y) low pressure headache EXAM: MRI THORACIC AND LUMBAR SPINE WITHOUT CONTRAST TECHNIQUE: Multiplanar and multiecho pulse sequences of the thoracic and lumbar spine were obtained without intravenous contrast. COMPARISON:  CT abdomen pelvis 11/01/2021 FINDINGS: MRI THORACIC SPINE FINDINGS Alignment:  Trace degenerative anterolisthesis at C7-T1. Vertebrae: No fracture, evidence of discitis, or aggressive osseous lesion. Cord: No abnormal cord signal. Paraspinal and other soft tissues: There is increased signal in the lower lungs bilaterally and evidence of hilar adenopathy. Disc levels: There is mild disc bulging at T7-T8 and minimal right-sided disc bulging at T11-T12 and T12-L1. There is  no significant spinal canal or neural foraminal stenosis at these or any other level in the thoracic spine. There are small perineural cysts on the left at T7-T8 and T8-T9 and on the right at T9-T10. MRI LUMBAR SPINE FINDINGS Segmentation:  Standard. Alignment:  Degenerative grade 1 anterolisthesis at L5-S1. Vertebrae: No fracture, evidence of discitis, or aggressive bone lesion. Conus medullaris and cauda equina: Conus extends to the L1 level. Conus and cauda equina appear normal. Paraspinal and other soft tissues: Paraspinal muscle atrophy. No other significant findings. Disc levels: L1-L2: Minimal disc bulging. Mild bilateral facet arthropathy. No significant stenosis. L2-L3: Mild disc bulging. Mild bilateral facet arthropathy. No significant stenosis. L3-L4: Small biforaminal disc bulging, ligamentum flavum hypertrophy and mild facet arthropathy results in mild spinal canal stenosis with bilateral subarticular stenosis, moderate left and mild-to-moderate right neural foraminal stenosis. L4-L5: Asymmetric right disc height loss and disc bulging with ligament flavum hypertrophy and bilateral facet arthropathy results in moderate right subarticular and severe right neural foraminal stenosis. Mild left subarticular and moderate neural foraminal stenosis. L5-S1: Grade 1 degenerative anterolisthesis with disc bulging and advanced bilateral facet arthropathy. No spinal canal stenosis. Moderate left and mild-to-moderate right neural foraminal stenosis. Tiny right-sided perineural cyst at S2. IMPRESSION: MR THORACIC SPINE IMPRESSION No acute findings in the thoracic spine.  No significant stenosis. MR LUMBAR SPINE IMPRESSION Multilevel degenerative changes of the lumbar spine, worst from L3 through S1, or some was below: L3-L4: Mild spinal canal and bilateral subarticular stenosis. Moderate left and mild-to-moderate right neural foraminal stenosis. L4-L5: Severe right neural foraminal stenosis likely impinging the  exiting nerve root. Moderate right and subarticular stenosis encroaching the descending L5 nerve root. Mild left subarticular and moderate left neural foraminal stenosis. L5-S1: Degenerative grade 1 anterolisthesis. Moderate left  and mild-to-moderate right neural foraminal stenosis. Electronically Signed   By: Maurine Simmering M.D.   On: 12/25/2021 15:57     Medical Consultants:   None.   Subjective:    Jenny Kelly still having headache  Objective:    Vitals:   12/27/21 0354 12/27/21 0430 12/27/21 0844 12/27/21 1118  BP: (!) 143/62  121/63 98/83  Pulse: 89  (!) 101 91  Resp: $Remo'20  16 14  'acuRD$ Temp: 98.6 F (37 C)  98.6 F (37 C) 98.4 F (36.9 C)  TempSrc: Oral  Oral Oral  SpO2:   94% 91%  Weight:  60.1 kg    Height:       SpO2: 91 %   Intake/Output Summary (Last 24 hours) at 12/27/2021 1123 Last data filed at 12/27/2021 0900 Gross per 24 hour  Intake 560 ml  Output 2100 ml  Net -1540 ml   Filed Weights   12/25/21 0500 12/26/21 0437 12/27/21 0430  Weight: 60.2 kg 60.1 kg 60.1 kg    Exam: General exam: In no acute distress. Respiratory system: Good air movement and clear to auscultation. Cardiovascular system: S1 & S2 heard, RRR. No JVD. Gastrointestinal system: Abdomen is nondistended, soft and nontender.  Extremities: No pedal edema. Skin: No rashes, lesions or ulcers Data Reviewed:    Labs: Basic Metabolic Panel: Recent Labs  Lab 12/22/21 2223 12/22/21 2228 12/25/21 0341 12/27/21 0824  NA 143 143 141 143  K 4.2 4.1 3.9 3.5  CL 107 110 110 114*  CO2 22  --  25 21*  GLUCOSE 127* 124* 121* 94  BUN 28* 28* 32* 26*  CREATININE 1.50* 1.50* 1.29* 1.14*  CALCIUM 9.3  --  8.9 8.7*   GFR Estimated Creatinine Clearance: 33.2 mL/min (A) (by C-G formula based on SCr of 1.14 mg/dL (H)). Liver Function Tests: Recent Labs  Lab 12/22/21 2223 12/27/21 0824  AST 25 12*  ALT 9 11  ALKPHOS 81 81  BILITOT 0.5 0.6  PROT 6.9 6.0*  ALBUMIN 2.7* 2.1*   No results for  input(s): "LIPASE", "AMYLASE" in the last 168 hours. No results for input(s): "AMMONIA" in the last 168 hours. Coagulation profile No results for input(s): "INR", "PROTIME" in the last 168 hours. COVID-19 Labs  Recent Labs    12/27/21 0824  CRP 22.7*    Lab Results  Component Value Date   SARSCOV2NAA NEGATIVE 12/22/2021   SARSCOV2NAA NEGATIVE 11/01/2021   [place] NEGATIVE 07/01/2021   [place] NEGATIVE 05/01/2021    CBC: Recent Labs  Lab 12/22/21 2223 12/22/21 2228 12/25/21 0341 12/27/21 0824  WBC 15.7*  --  13.3* 21.4*  NEUTROABS 11.5*  --   --  14.9*  HGB 13.1 13.9 11.5* 12.6  HCT 41.2 41.0 36.2 39.0  MCV 89.8  --  87.9 85.9  PLT 213  --  214 223   Cardiac Enzymes: No results for input(s): "CKTOTAL", "CKMB", "CKMBINDEX", "TROPONINI" in the last 168 hours. BNP (last 3 results) No results for input(s): "PROBNP" in the last 8760 hours. CBG: Recent Labs  Lab 12/26/21 0621 12/26/21 1108 12/26/21 1644 12/26/21 2213 12/27/21 0605  GLUCAP 89 185* 71 81 79   D-Dimer: No results for input(s): "DDIMER" in the last 72 hours. Hgb A1c: No results for input(s): "HGBA1C" in the last 72 hours. Lipid Profile: No results for input(s): "CHOL", "HDL", "LDLCALC", "TRIG", "CHOLHDL", "LDLDIRECT" in the last 72 hours. Thyroid function studies: No results for input(s): "TSH", "T4TOTAL", "T3FREE", "THYROIDAB" in the last 72 hours.  Invalid input(s): "FREET3" Anemia work up: No results for input(s): "VITAMINB12", "FOLATE", "FERRITIN", "TIBC", "IRON", "RETICCTPCT" in the last 72 hours. Sepsis Labs: Recent Labs  Lab 12/22/21 2223 12/25/21 0341 12/27/21 0824  WBC 15.7* 13.3* 21.4*   Microbiology Recent Results (from the past 240 hour(s))  Resp Panel by RT-PCR (Flu A&B, Covid) Anterior Nasal Swab     Status: None   Collection Time: 12/22/21  9:55 PM   Specimen: Anterior Nasal Swab  Result Value Ref Range Status   SARS Coronavirus 2 by RT PCR NEGATIVE NEGATIVE Final     Comment: (NOTE) SARS-CoV-2 target nucleic acids are NOT DETECTED.  The SARS-CoV-2 RNA is generally detectable in upper respiratory specimens during the acute phase of infection. The lowest concentration of SARS-CoV-2 viral copies this assay can detect is 138 copies/mL. A negative result does not preclude SARS-Cov-2 infection and should not be used as the sole basis for treatment or other patient management decisions. A negative result may occur with  improper specimen collection/handling, submission of specimen other than nasopharyngeal swab, presence of viral mutation(s) within the areas targeted by this assay, and inadequate number of viral copies(<138 copies/mL). A negative result must be combined with clinical observations, patient history, and epidemiological information. The expected result is Negative.  Fact Sheet for Patients:  EntrepreneurPulse.com.au  Fact Sheet for Healthcare Providers:  IncredibleEmployment.be  This test is no t yet approved or cleared by the [place] FDA and  has been authorized for detection and/or diagnosis of SARS-CoV-2 by FDA under an Emergency Use Authorization (EUA). This EUA will remain  in effect (meaning this test can be used) for the duration of the COVID-19 declaration under Section 564(b)(1) of the Act, 21 U.S.C.section 360bbb-3(b)(1), unless the authorization is terminated  or revoked sooner.       Influenza A by PCR NEGATIVE NEGATIVE Final   Influenza B by PCR NEGATIVE NEGATIVE Final    Comment: (NOTE) The Xpert Xpress SARS-CoV-2/FLU/RSV plus assay is intended as an aid in the diagnosis of influenza from Nasopharyngeal swab specimens and should not be used as a sole basis for treatment. Nasal washings and aspirates are unacceptable for Xpert Xpress SARS-CoV-2/FLU/RSV testing.  Fact Sheet for Patients: EntrepreneurPulse.com.au  Fact Sheet for Healthcare  Providers: IncredibleEmployment.be  This test is not yet approved or cleared by the [place] FDA and has been authorized for detection and/or diagnosis of SARS-CoV-2 by FDA under an Emergency Use Authorization (EUA). This EUA will remain in effect (meaning this test can be used) for the duration of the COVID-19 declaration under Section 564(b)(1) of the Act, 21 U.S.C. section 360bbb-3(b)(1), unless the authorization is terminated or revoked.  Performed at [facility], [place] [address] [place], [place] [address]   Surgical pcr screen     Status: Abnormal   Collection Time: 12/26/21 11:59 PM   Specimen: Nasal Mucosa; Nasal Swab  Result Value Ref Range Status   MRSA, PCR NEGATIVE NEGATIVE Final   Staphylococcus aureus POSITIVE (A) NEGATIVE Final    Comment: (NOTE) The Xpert SA Assay (FDA approved for NASAL specimens in patients 76 years of age and older), is one component of a comprehensive surveillance program. It is not intended to diagnose infection nor to guide or monitor treatment. Performed at [facility], [place] [address], [place], [place] [address]      Medications:    aspirin EC  81 mg Oral Daily   caffeine  200 mg Oral Q4H   Chlorhexidine Gluconate  Cloth  6 each Topical Q0600   donepezil  10 mg Oral QHS   doxycycline  100 mg Oral Q12H   guaiFENesin  600 mg Oral BID    HYDROmorphone (DILAUDID) injection  1 mg Intravenous Once   insulin aspart  0-15 Units Subcutaneous TID WC   levothyroxine  88 mcg Oral QAC breakfast   montelukast  10 mg Oral QHS   mupirocin ointment  1 Application Nasal BID   pantoprazole  40 mg Oral BID   pregabalin  50 mg Oral BID   topiramate  200 mg Oral BID   Continuous Infusions:  sodium chloride        LOS: 3 days   Charlynne Cousins  [facility]  12/27/2021, 11:23 AM

## 2021-12-27 NOTE — Progress Notes (Signed)
To MRI

## 2021-12-27 NOTE — Plan of Care (Signed)
  Problem: Clinical Measurements: Goal: Diagnostic test results will improve Outcome: Progressing   Problem: Activity: Goal: Risk for activity intolerance will decrease Outcome: Progressing   Problem: Coping: Goal: Level of anxiety will decrease Outcome: Progressing   Problem: Pain Managment: Goal: General experience of comfort will improve Outcome: Progressing   Problem: Safety: Goal: Ability to remain free from injury will improve Outcome: Progressing   Problem: Skin Integrity: Goal: Risk for impaired skin integrity will decrease Outcome: Progressing   

## 2021-12-28 DIAGNOSIS — G9600 Cerebrospinal fluid leak, unspecified: Secondary | ICD-10-CM

## 2021-12-28 DIAGNOSIS — J9611 Chronic respiratory failure with hypoxia: Secondary | ICD-10-CM | POA: Diagnosis not present

## 2021-12-28 DIAGNOSIS — J449 Chronic obstructive pulmonary disease, unspecified: Secondary | ICD-10-CM | POA: Diagnosis not present

## 2021-12-28 DIAGNOSIS — I5032 Chronic diastolic (congestive) heart failure: Secondary | ICD-10-CM | POA: Diagnosis not present

## 2021-12-28 LAB — ANCA TITERS
Atypical P-ANCA titer: <1:20 {titer}
C-ANCA: <1:20 {titer}
P-ANCA: <1:20 {titer}

## 2021-12-28 LAB — GLUCOSE, CAPILLARY
Glucose-Capillary: 112 mg/dL — ABNORMAL HIGH (ref 70–99)
Glucose-Capillary: 123 mg/dL — ABNORMAL HIGH (ref 70–99)
Glucose-Capillary: 109 mg/dL — ABNORMAL HIGH (ref 70–99)
Glucose-Capillary: 136 mg/dL — ABNORMAL HIGH (ref 70–99)

## 2021-12-28 MED ORDER — IOHEXOL 180 MG/ML  SOLN
20.0000 mL | Freq: Once | INTRAMUSCULAR | Status: AC | PRN
  Administered 2021-12-28: 5 mL

## 2021-12-28 NOTE — Progress Notes (Signed)
Has remained on flat bedrest, with minimal repositioning left to right with spouse at her side.  Has had minimal to no headache since having blood patch and is receiving caffeine tablets.

## 2021-12-28 NOTE — Plan of Care (Signed)
  Problem: Clinical Measurements: Goal: Will remain free from infection Outcome: Progressing Goal: Respiratory complications will improve Outcome: Progressing   Problem: Activity: Goal: Risk for activity intolerance will decrease Outcome: Progressing   Problem: Coping: Goal: Level of anxiety will decrease Outcome: Progressing   

## 2021-12-28 NOTE — Progress Notes (Signed)
Neurology Progress Note   S:// Still complaining of severe, sharp right sided headache rated 10/10 but states it has improved somewhat since blood patch C/o confusion and feeling generally poorly Still drowsy from pain meds  O:// Current vital signs: BP (!) 130/55   Pulse 71   Temp 98.6 F (37 C) (Oral)   Resp 20   Ht 5\' 2"  (1.575 m)   Wt 60.1 kg   SpO2 100%   BMI 24.23 kg/m  Vital signs in last 24 hours: Temp:  [98 F (36.7 C)-98.6 F (37 C)] 98.6 F (37 C) (09/07 0802) Pulse Rate:  [69-100] 71 (09/07 0802) Resp:  [14-20] 20 (09/07 0802) BP: (98-138)/(43-83) 130/55 (09/07 0804) SpO2:  [91 %-100 %] 100 % (09/07 0802)  GENERAL: Awake, drowsy in NAD HEENT: - Normocephalic and atraumatic.  Headache in right frontal area, improved somewhat since yesterday LUNGS -breathing comfortably with no audible wheezing CV -perfusing extremities well  NEURO:  Mental Status: Sleepy, awakens briefly, reports she is at [facility], very perseverative on prior answers, poor attention / concentration Language: speech is clear and fluent.  Language exam limited by pain and attention  Cranial Nerves: PERRL EOMI, visual fields full  no facial asymmetry, hearing intact, tongue/uvula/soft palate midline,  Motor: Minimal pronator drift of the left upper extremity > right upper extremity Sensation- Intact to light touch throughout and equal on both sides  Medications  Current Facility-Administered Medications:    0.9 %  sodium chloride infusion, , Intravenous, Continuous, Charlynne Cousins, MD, Last Rate: 75 mL/hr at 12/27/21 1710, New Bag at 12/27/21 1710   acetaminophen (TYLENOL) tablet 650 mg, 650 mg, Oral, Q6H PRN, 650 mg at 12/25/21 1632 **OR** acetaminophen (TYLENOL) suppository 650 mg, 650 mg, Rectal, Q6H PRN, Kristopher Oppenheim, DO   aspirin EC tablet 81 mg, 81 mg, Oral, Daily, Kristopher Oppenheim, DO, 81 mg at 12/28/21 0840   caffeine tablet 200 mg, 200 mg, Oral, Q4H, Ajooni Karam L, MD, 200 mg at  12/28/21 0840   Chlorhexidine Gluconate Cloth 2 % PADS 6 each, 6 each, Topical, Q0600, [name], MD, 6 each at 12/28/21 0841   donepezil (ARICEPT) tablet 10 mg, 10 mg, Oral, QHS, Kristopher Oppenheim, DO, 10 mg at 12/27/21 2136   doxycycline (VIBRA-TABS) tablet 100 mg, 100 mg, Oral, Q12H, Darrick Meigs, Gagan S, MD, 100 mg at 12/28/21 0841   guaiFENesin (MUCINEX) 12 hr tablet 600 mg, 600 mg, Oral, BID, Darrick Meigs, Gagan S, MD, 600 mg at 12/28/21 0840   HYDROmorphone (DILAUDID) injection 0.5 mg, 0.5 mg, Intravenous, Q2H PRN, Tashira Torre L, MD, 0.5 mg at 12/28/21 0357   HYDROmorphone (DILAUDID) injection 1 mg, 1 mg, Intravenous, Once, [place], Marge Duncans, MD   insulin aspart (novoLOG) injection 0-15 Units, 0-15 Units, Subcutaneous, TID WC, Antero Derosia L, MD, 3 Units at 12/26/21 1243   ipratropium-albuterol (DUONEB) 0.5-2.5 (3) MG/3ML nebulizer solution 3 mL, 3 mL, Nebulization, Q6H PRN, Kristopher Oppenheim, DO   levothyroxine (SYNTHROID) tablet 88 mcg, 88 mcg, Oral, QAC breakfast, Kristopher Oppenheim, DO, 88 mcg at 12/28/21 0604   montelukast (SINGULAIR) tablet 10 mg, 10 mg, Oral, QHS, Chen, Eric, DO, 10 mg at 12/27/21 2135   mupirocin ointment (BACTROBAN) 2 % 1 Application, 1 Application, Nasal, BID, Darrick Meigs, Marge Duncans, MD, 1 Application at [id number]   ondansetron (ZOFRAN) tablet 4 mg, 4 mg, Oral, Q6H PRN **OR** ondansetron (ZOFRAN) injection 4 mg, 4 mg, Intravenous, Q6H PRN, Kristopher Oppenheim, DO   Oral care mouth rinse, 15  mL, Mouth Rinse, PRN, Darrick Meigs, Marge Duncans, MD   oxyCODONE (Oxy IR/ROXICODONE) immediate release tablet 5 mg, 5 mg, Oral, Q4H PRN, Nekia Maxham L, MD, 5 mg at 12/27/21 1300   pantoprazole (PROTONIX) EC tablet 40 mg, 40 mg, Oral, BID, Kristopher Oppenheim, DO, 40 mg at 12/28/21 0840   polyethylene glycol (MIRALAX / GLYCOLAX) packet 17 g, 17 g, Oral, Daily, Abrianna Sidman L, MD, 17 g at 12/28/21 0840   pregabalin (LYRICA) capsule 50 mg, 50 mg, Oral, BID, Kristopher Oppenheim, DO, 50 mg at 12/28/21 0840   topiramate (TOPAMAX) tablet 200 mg, 200  mg, Oral, BID, Kristopher Oppenheim, DO, 200 mg at 12/28/21 0840 Labs   Basic Metabolic Panel: Recent Labs  Lab 12/22/21 2223 12/22/21 2228 12/25/21 0341 12/27/21 0824  NA 143 143 141 143  K 4.2 4.1 3.9 3.5  CL 107 110 110 114*  CO2 22  --  25 21*  GLUCOSE 127* 124* 121* 94  BUN 28* 28* 32* 26*  CREATININE 1.50* 1.50* 1.29* 1.14*  CALCIUM 9.3  --  8.9 8.7*     CBC: Recent Labs  Lab 12/22/21 2223 12/22/21 2228 12/25/21 0341 12/27/21 0824  WBC 15.7*  --  13.3* 21.4*  NEUTROABS 11.5*  --   --  14.9*  HGB 13.1 13.9 11.5* 12.6  HCT 41.2 41.0 36.2 39.0  MCV 89.8  --  87.9 85.9  PLT 213  --  214 223     Lab Results  Component Value Date   ESRSEDRATE 57 (H) 12/23/2021   Lab Results  Component Value Date   ESRSEDRATE 60 (H) 12/27/2021    Lab Results  Component Value Date   CRP 21.3 (H) 12/23/2021   Lab Results  Component Value Date   CRP 22.7 (H) 12/27/2021      Imaging I have reviewed images in epic and the results pertinent to this consultation are:  MRI brain w/ and w/o 9/6 personally reviewed, agree with radiology:   1. No acute intracranial process. No evidence of acute or subacute infarct. No abnormal parenchymal or meningeal enhancement. 2. No findings suggestive of spontaneous intracranial hypotension or CSF leak. No dural thickening/enhancement, enlargement of the pituitary gland, or expansion of the venous sinuses. 3. Air-fluid level in the right maxillary sinus, as can be seen with acute sinusitis. Correlate with symptoms.  CT-scan of the brain 1. No acute intracranial pathology. 2. Old right MCA territory infarct and encephalomalacia. 3. Paranasal sinus disease and right mastoid effusion.   MRI examination of the brain 9/2 1. No acute intracranial abnormality. 2. 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  [place] bilateral temporal - Absence of a "halo" sign in the bilateral temporal artery,  although not  definitive, makes a diagnosis of temporal arteritis   MR THORACIC SPINE IMPRESSION No acute findings in the thoracic spine.  No significant stenosis. Re-reviewed with Neuroradiology today, in particular possible leak at the right T9 nerve root though this is not a definite finding   MR LUMBAR SPINE IMPRESSION Multilevel degenerative changes of the lumbar spine, worst from L3 through S1, or some was below: L3-L4: Mild spinal canal and bilateral subarticular stenosis. Moderate left and mild-to-moderate right neural foraminal stenosis. L4-L5: Severe right neural foraminal stenosis likely impinging the exiting nerve root. Moderate right and subarticular stenosis encroaching the descending L5 nerve root. Mild left subarticular and moderate left neural foraminal stenosis. L5-S1: Degenerative grade 1 anterolisthesis. Moderate left and mild-to-moderate right neural foraminal stenosis.  There are small perineural cysts on the left at T7-T8 and T8-T9 and on the right at T9-T10. Tiny right-sided perineural cyst at S2.  CT myleogram personally reviewed, agree with radiology:   1. Broad-based disc protrusion at L4-5 with asymmetric right-sided facet hypertrophy resulting in moderate foraminal stenosis bilaterally. 2. CSF contrast extends into the right foramen at L4-5 and beyond the right foramen, suggesting CSF leak. 3. 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 These can be associated with CSF leaks without definite leak. 4. Right laminectomy at L5-S1 with mild foraminal stenosis, worse right than left. 5. Multilevel spondylosis of the cervical spine with mild central and moderate to severe foraminal stenosis at C5-6. 6. Mild right foraminal narrowing at C2-3 and C3-4. 7. Moderate left foraminal stenosis at C3-4. 8. Moderate foraminal stenosis at L3-4 is worse left than right. 9. Pulmonary  fibrosis. 10. More consolidated airspace disease in the right lower lobe is concerning for pneumonia. 11. Aortic Atherosclerosis (ICD10-I70.0).  EEG  - Periodic discharges with triphasic morphology, generalized ( GPDs) - Continuous slow, generalized    Latest Reference Range & Units 12/27/21 13:20  Appearance, CSF CLEAR  HAZY !  Glucose, CSF 40 - 70 mg/dL 56  RBC Count, CSF 0 /cu mm 2,475 (H)  WBC, CSF 0 - 5 /cu mm 4  Other Cells, CSF  TOO FEW TO COUNT, SMEAR AVAILABLE FOR REVIEW  Color, CSF COLORLESS  PINK !  Supernatant  CLEAR  Total  Protein, CSF 15 - 45 mg/dL 81 (H)  Tube #  1  !: Data is abnormal (H): Data is abnormally high  Assessment:  76 yo WF with hx of afib, on xarelto, hx of CVA with residual right sided weakness, hx of cluster HA and migraines, CKD stage 3a, COPD, pulmonary fibrosis, seizure disorder, presented to ER with bitemporal headache today.  Initial concern was for possible GCA given her elevated inflammatory markers, and she was treated with 3 days of pulse dose steroids (9/2-9/4) without improvement in her headache.  On observation her headache has a severe significant positional quality highly concerning for CSF leak headache, but no etiology has been identified on MRI T or L-spine. MRI Brain not c/w CSF leak headache findings, but this is a poorly sensitive exam so proceeded to CT Myelogram which identified a likely leak in the lumbar spine. Greatly appreciate blood patch performed by Dr. Alfredo Batty on 9/6; CSF sampled at the time c/w low pressure (CSF had to be obtained with gentle aspiration and c/w traumatic tap with culture negative to date).  Patient to remain on strict supine bedrest for 24 hours after procedure, 48 hours if still symptomatic on sitting up.  She reports that her headache has improved somewhat.  Exam today notable for ability to move eyes in all directions without pain and less left-sided weakness than prior; husband at bedside reports patient is  much improved despite her continued complaints  EEG shows GPDs likely due to toxic metabolic casuses  Impression: Right sided sharp headache, likely low pressure headache s/p blood patch  Recommendations: -Continue strict supine bedrest for 24 hours post blood patch -oxycodone weaned from 10 mg to 5 mg due to delirium and somnolence -continue caffeine scheduled with hold parameter for HR > 110 or BP > 180/105 -hold xarelto for now, may start tomorrow -appreciate IVF per primary team -Neurology will continue to follow   Brooke Dare MD-PhD [facility] [phone] Available 7 AM to 7 PM, outside these hours please contact Neurologist on call listed on AMION

## 2021-12-28 NOTE — Progress Notes (Signed)
[facility] PROGRESS NOTE    Progress Note  Jenny Kelly  MOQ:[id number] DOB: 1945-08-22 DOA: 12/22/2021 PCP: Townsend Roger, MD     Brief Narrative:   Jenny Kelly is an 76 y.o. female past medical history of atrial fibrillation on Xarelto, history of CVA with right residual weakness, migraines chronic kidney disease stage IIIa, pulmonary fibrosis seizure disorder came into the ED for bitemporal headaches associated with photophobia.  Patient is on Topamax and Lyrica for history of seizures and headache. In the ED, ESR and CRP were elevated neurology was consulted.  MRI of the brain and CT of the head were negative.  Due to elevated markers and bitemporal headache temporal arteritis was considered ultrasound did not show halo sign vascular surgery was consulted for possible biopsy Xarelto is on hold for anticipation of biopsy on 12/27/2021. CT myelogram showed CSF contrast extending into the right foraminal L4-L5.  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 MRI of the brain no suggestive findings for intracranial hypotension or CSF leak.   CT myelogram showed possible CSF leak. Neurology consulted IR status post blood patch on 12/27/2021. Pain has been scheduled hold for heart rate greater than 110 and blood pressure greater than 180. EEG showed no evidence seizures, but it did show possible toxic metabolic cause. Further management per neurology. Elevated inflammatory markers could be due to CSF leak.  Acute bronchitis/pulmonary fibrosis: Currently not on oxygen has basilar crackles continue Doxy flutter valve and Mucinex. Sputum cultures has been negative.  History of CVA with residual deficits: Noted.  Paroxysmal atrial fibrillation: Xarelto on hold, neurology to dictate when to restart.  Chronic disease stage IIIa: Creatinine at baseline.  Chronic diastolic heart  failure: Appears euvolemic and stable.  COPD: Stable.   DVT prophylaxis: SCD Family Communication:husband Status is: Inpatient Remains inpatient appropriate because: headache    Code Status:     Code Status Orders  (From admission, onward)           Start     Ordered   12/23/21 0811  Do not attempt resuscitation (DNR)  Continuous       Question Answer Comment  In the event of cardiac or respiratory ARREST Do not call a "code blue"   In the event of cardiac or respiratory ARREST Do not perform Intubation, CPR, defibrillation or ACLS   In the event of cardiac or respiratory ARREST Use medication by any route, position, wound care, and other measures to relive pain and suffering. May use oxygen, suction and manual treatment of airway obstruction as needed for comfort.      12/23/21 0810           Code Status History     Date Active Date Inactive Code Status Order ID Comments User Context   11/01/2021 0815 11/04/2021 1854 DNR [id number]  Norval Morton, MD ED   07/01/2021 0927 07/07/2021 1851 DNR [id number]  Karmen Bongo, MD ED   05/07/2021 1433 05/16/2021 1732 Partial Code [id number]  Charlett Blake, MD Inpatient   05/07/2021 1431 05/07/2021 1433 Full Code [id number]  Charlett Blake, MD Inpatient   05/01/2021 1755 05/07/2021 1357 Partial Code [id number] Intubation short term only if needed Germain Osgood, PA-C ED         IV Access:   Peripheral IV   Procedures and diagnostic studies:   DG Myelogram  2+ Regions  Result Date: 12/27/2021 CLINICAL DATA:  Positional headaches. 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 FLUOROSCOPY: Radiation Exposure Index (as provided by the fluoroscopic device): Dose area product 109.57 uGy*m2 PROCEDURE: LUMBAR PUNCTURE FOR CERVICAL LUMBAR AND THORACIC MYELOGRAM CERVICAL AND LUMBAR AND THORACIC MYELOGRAM CT CERVICAL MYELOGRAM CT LUMBAR MYELOGRAM CT THORACIC MYELOGRAM After thorough discussion of risks and benefits of the  procedure including bleeding, infection, injury to nerves, blood vessels, adjacent structures as well as headache and CSF leak, written and oral informed consent was obtained. Consent was obtained by Ascencion Dike, PA Patient was positioned prone on the fluoroscopy table. Local anesthesia was provided with 1% lidocaine without epinephrine after prepped and draped in the usual sterile fashion. Puncture was performed at L2-3 using a 3 1/2 inch 22-gauge spinal needle via right paramedian approach. Using a single pass through the dura, the needle was placed within the thecal sac, with return of clear CSF. 10 mL Isovue M-300 was injected into the thecal sac, with normal opacification of the nerve roots and cauda equina consistent with free flow within the subarachnoid space. The patient was then moved to the trendelenburg position and contrast flowed into the Thoracic and Cervical spine regions. I personally supervised the lumbar puncture performed by Ascencion Dike. I personally administered the intrathecal contrast. I also personally supervised acquisition of the myelogram images. TECHNIQUE: Contiguous axial images were obtained through the Cervical, Thoracic, and Lumbar spine after the intrathecal infusion of infusion. Coronal and sagittal reconstructions were obtained of the axial image sets. FINDINGS: MYELOGRAM FINDINGS: We were unable to adequately position the patient for myelogram images. Contrast is not well seen below the L4-5 level. No extra canalicular contrast is visualized. Contrast was followed into the cervical spine. CT CERVICAL MYELOGRAM FINDINGS: Slight degenerative anterolisthesis is present at C2-3 and C3-4. Straightening of the normal cervical lordosis is present. Atherosclerotic calcifications are present at the carotid bifurcations, right greater than left. Soft tissues the neck are otherwise unremarkable. C2-3: Asymmetric right-sided facet hypertrophy is present. Mild right foraminal narrowing is  present. Soft disc protrusion partially effaces the ventral CSF. C3-4: A shallow central disc protrusion partially effaces the ventral CSF. Advanced facet hypertrophy is present. Moderate left foraminal stenosis is present. C4-5: A rightward disc osteophyte complex is present. There is some impression on the right side of the cord. C5-6: A broad-based disc osteophyte complex is present. This partially effaces the ventral CSF. Moderate foraminal stenosis is present bilaterally. A broad-based disc osteophyte complex is present. The canal is narrowed to 9 mm. Moderate to severe foraminal stenosis is present bilaterally. C7-T1: Moderate facet hypertrophy is present bilaterally. No significant stenosis is present. No significant CSF contrast is present in the foramina or beyond to suggest a CSF leak. CT LUMBAR MYELOGRAM FINDINGS: Comparison made with MRI of lumbar spine without contrast 12/25/2021. Five non rib-bearing lumbar type vertebral bodies are present. Conus terminates at L1. Vertebral body heights are normal. No significant listhesis is present. Atherosclerotic calcifications are present in the aorta without aneurysm. Mild rightward curvature is centered at the thoracolumbar junction. Asymmetric leftward curvature is present at L4-5. L1-2: Negative. L2-3: Mild disc bulging facet hypertrophy is present. No significant stenosis were CSF leak is present. L3-4: A broad-based disc protrusion is present. Moderate facet hypertrophy is noted. Moderate foraminal narrowing is worse left than right. L4-5: The rightward disc protrusion is again noted. Asymmetric right sided facet hypertrophy is present. CSF contrast can be seen into the right foramen and  beyond the right foramen, suggesting CSF leak. No vascular contrast is present. L5-S1: Moderate facet hypertrophy is present. Right laminectomy noted. Mild foraminal stenosis is worse right than left. No significant extra canalicular CSF contrast is present. CT THORACIC  MYELOGRAM FINDINGS: Comparison made with MRI of the thoracic spine 12/25/2021. Twelve rib-bearing thoracic type vertebral bodies are present. Cord morphology is within normal limits. Alignment is anatomic. Thoracic kyphosis is preserved. Extensive fibrotic changes are again seen within both lungs. More consolidated airspace disease is present in the medial right lower lobe. Airways are patent. Atherosclerotic calcifications are present in the aortic arch and descending thoracic aorta. No aneurysm is present. 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 Small amount of contrast extends into the perineural root sleeve cysts bilaterally at T5-6 and T6-7. No other significant extra canalicular CSF contrast is present in the thoracic spine. IMPRESSION: 1. Broad-based disc protrusion at L4-5 with asymmetric right-sided facet hypertrophy resulting in moderate foraminal stenosis bilaterally. 2. CSF contrast extends into the right foramen at L4-5 and beyond the right foramen, suggesting CSF leak. 3. 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 These can be associated with CSF leaks without definite leak. 4. Right laminectomy at L5-S1 with mild foraminal stenosis, worse right than left. 5. Multilevel spondylosis of the cervical spine with mild central and moderate to severe foraminal stenosis at C5-6. 6. Mild right foraminal narrowing at C2-3 and C3-4. 7. Moderate left foraminal stenosis at C3-4. 8. Moderate foraminal stenosis at L3-4 is worse left than right. 9. Pulmonary fibrosis. 10. More consolidated airspace disease in the right lower lobe is concerning for pneumonia. 11. Aortic Atherosclerosis (ICD10-I70.0). Electronically Signed   By: San Morelle M.D.   On: 12/27/2021 16:30   CT CERVICAL SPINE W CONTRAST  Result Date: 12/27/2021 CLINICAL DATA:  Positional headaches. Perineural root sleeve cyst on  the right at T9-10. FLUOROSCOPY: Radiation Exposure Index (as provided by the fluoroscopic device): Dose area product 109.57 uGy*m2 PROCEDURE: LUMBAR PUNCTURE FOR CERVICAL LUMBAR AND THORACIC MYELOGRAM CERVICAL AND LUMBAR AND THORACIC MYELOGRAM CT CERVICAL MYELOGRAM CT LUMBAR MYELOGRAM CT THORACIC MYELOGRAM After thorough discussion of risks and benefits of the procedure including bleeding, infection, injury to nerves, blood vessels, adjacent structures as well as headache and CSF leak, written and oral informed consent was obtained. Consent was obtained by Ascencion Dike, PA Patient was positioned prone on the fluoroscopy table. Local anesthesia was provided with 1% lidocaine without epinephrine after prepped and draped in the usual sterile fashion. Puncture was performed at L2-3 using a 3 1/2 inch 22-gauge spinal needle via right paramedian approach. Using a single pass through the dura, the needle was placed within the thecal sac, with return of clear CSF. 10 mL Isovue M-300 was injected into the thecal sac, with normal opacification of the nerve roots and cauda equina consistent with free flow within the subarachnoid space. The patient was then moved to the trendelenburg position and contrast flowed into the Thoracic and Cervical spine regions. I personally supervised the lumbar puncture performed by Ascencion Dike. I personally administered the intrathecal contrast. I also personally supervised acquisition of the myelogram images. TECHNIQUE: Contiguous axial images were obtained through the Cervical, Thoracic, and Lumbar spine after the intrathecal infusion of infusion. Coronal and sagittal reconstructions were obtained of the axial image sets. FINDINGS: MYELOGRAM FINDINGS: We were unable to adequately position the patient  for myelogram images. Contrast is not well seen below the L4-5 level. No extra canalicular contrast is visualized. Contrast was followed into the cervical spine. CT CERVICAL MYELOGRAM FINDINGS:  Slight degenerative anterolisthesis is present at C2-3 and C3-4. Straightening of the normal cervical lordosis is present. Atherosclerotic calcifications are present at the carotid bifurcations, right greater than left. Soft tissues the neck are otherwise unremarkable. C2-3: Asymmetric right-sided facet hypertrophy is present. Mild right foraminal narrowing is present. Soft disc protrusion partially effaces the ventral CSF. C3-4: A shallow central disc protrusion partially effaces the ventral CSF. Advanced facet hypertrophy is present. Moderate left foraminal stenosis is present. C4-5: A rightward disc osteophyte complex is present. There is some impression on the right side of the cord. C5-6: A broad-based disc osteophyte complex is present. This partially effaces the ventral CSF. Moderate foraminal stenosis is present bilaterally. A broad-based disc osteophyte complex is present. The canal is narrowed to 9 mm. Moderate to severe foraminal stenosis is present bilaterally. C7-T1: Moderate facet hypertrophy is present bilaterally. No significant stenosis is present. No significant CSF contrast is present in the foramina or beyond to suggest a CSF leak. CT LUMBAR MYELOGRAM FINDINGS: Comparison made with MRI of lumbar spine without contrast 12/25/2021. Five non rib-bearing lumbar type vertebral bodies are present. Conus terminates at L1. Vertebral body heights are normal. No significant listhesis is present. Atherosclerotic calcifications are present in the aorta without aneurysm. Mild rightward curvature is centered at the thoracolumbar junction. Asymmetric leftward curvature is present at L4-5. L1-2: Negative. L2-3: Mild disc bulging facet hypertrophy is present. No significant stenosis were CSF leak is present. L3-4: A broad-based disc protrusion is present. Moderate facet hypertrophy is noted. Moderate foraminal narrowing is worse left than right. L4-5: The rightward disc protrusion is again noted. Asymmetric right  sided facet hypertrophy is present. CSF contrast can be seen into the right foramen and beyond the right foramen, suggesting CSF leak. No vascular contrast is present. L5-S1: Moderate facet hypertrophy is present. Right laminectomy noted. Mild foraminal stenosis is worse right than left. No significant extra canalicular CSF contrast is present. CT THORACIC MYELOGRAM FINDINGS: Comparison made with MRI of the thoracic spine 12/25/2021. Twelve rib-bearing thoracic type vertebral bodies are present. Cord morphology is within normal limits. Alignment is anatomic. Thoracic kyphosis is preserved. Extensive fibrotic changes are again seen within both lungs. More consolidated airspace disease is present in the medial right lower lobe. Airways are patent. Atherosclerotic calcifications are present in the aortic arch and descending thoracic aorta. No aneurysm is present. 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 Small amount of contrast extends into the perineural root sleeve cysts bilaterally at T5-6 and T6-7. No other significant extra canalicular CSF contrast is present in the thoracic spine. IMPRESSION: 1. Broad-based disc protrusion at L4-5 with asymmetric right-sided facet hypertrophy resulting in moderate foraminal stenosis bilaterally. 2. CSF contrast extends into the right foramen at L4-5 and beyond the right foramen, suggesting CSF leak. 3. 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 These can be associated with CSF leaks without definite leak. 4. Right laminectomy at L5-S1 with mild foraminal stenosis, worse right than left. 5. Multilevel spondylosis of the cervical spine with mild central and moderate to severe foraminal stenosis at C5-6. 6. Mild right foraminal narrowing at C2-3 and C3-4. 7. Moderate left foraminal stenosis at C3-4. 8. Moderate foraminal stenosis at  L3-4 is worse left than right. 9.  Pulmonary fibrosis. 10. More consolidated airspace disease in the right lower lobe is concerning for pneumonia. 11. Aortic Atherosclerosis (ICD10-I70.0). Electronically Signed   By: San Morelle M.D.   On: 12/27/2021 16:30   CT THORACIC SPINE W CONTRAST  Result Date: 12/27/2021 CLINICAL DATA:  Positional headaches. 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 FLUOROSCOPY: Radiation Exposure Index (as provided by the fluoroscopic device): Dose area product 109.57 uGy*m2 PROCEDURE: LUMBAR PUNCTURE FOR CERVICAL LUMBAR AND THORACIC MYELOGRAM CERVICAL AND LUMBAR AND THORACIC MYELOGRAM CT CERVICAL MYELOGRAM CT LUMBAR MYELOGRAM CT THORACIC MYELOGRAM After thorough discussion of risks and benefits of the procedure including bleeding, infection, injury to nerves, blood vessels, adjacent structures as well as headache and CSF leak, written and oral informed consent was obtained. Consent was obtained by Ascencion Dike, PA Patient was positioned prone on the fluoroscopy table. Local anesthesia was provided with 1% lidocaine without epinephrine after prepped and draped in the usual sterile fashion. Puncture was performed at L2-3 using a 3 1/2 inch 22-gauge spinal needle via right paramedian approach. Using a single pass through the dura, the needle was placed within the thecal sac, with return of clear CSF. 10 mL Isovue M-300 was injected into the thecal sac, with normal opacification of the nerve roots and cauda equina consistent with free flow within the subarachnoid space. The patient was then moved to the trendelenburg position and contrast flowed into the Thoracic and Cervical spine regions. I personally supervised the lumbar puncture performed by Ascencion Dike. I personally administered the intrathecal contrast. I also personally supervised acquisition of the myelogram images. TECHNIQUE: Contiguous axial images were obtained through the Cervical, Thoracic, and Lumbar spine after the intrathecal infusion of  infusion. Coronal and sagittal reconstructions were obtained of the axial image sets. FINDINGS: MYELOGRAM FINDINGS: We were unable to adequately position the patient for myelogram images. Contrast is not well seen below the L4-5 level. No extra canalicular contrast is visualized. Contrast was followed into the cervical spine. CT CERVICAL MYELOGRAM FINDINGS: Slight degenerative anterolisthesis is present at C2-3 and C3-4. Straightening of the normal cervical lordosis is present. Atherosclerotic calcifications are present at the carotid bifurcations, right greater than left. Soft tissues the neck are otherwise unremarkable. C2-3: Asymmetric right-sided facet hypertrophy is present. Mild right foraminal narrowing is present. Soft disc protrusion partially effaces the ventral CSF. C3-4: A shallow central disc protrusion partially effaces the ventral CSF. Advanced facet hypertrophy is present. Moderate left foraminal stenosis is present. C4-5: A rightward disc osteophyte complex is present. There is some impression on the right side of the cord. C5-6: A broad-based disc osteophyte complex is present. This partially effaces the ventral CSF. Moderate foraminal stenosis is present bilaterally. A broad-based disc osteophyte complex is present. The canal is narrowed to 9 mm. Moderate to severe foraminal stenosis is present bilaterally. C7-T1: Moderate facet hypertrophy is present bilaterally. No significant stenosis is present. No significant CSF contrast is present in the foramina or beyond to suggest a CSF leak. CT LUMBAR MYELOGRAM FINDINGS: Comparison made with MRI of lumbar spine without contrast 12/25/2021. Five non rib-bearing lumbar type vertebral bodies are present. Conus terminates at L1. Vertebral body heights are normal. No significant listhesis is present. Atherosclerotic calcifications are present in the aorta without aneurysm. Mild rightward curvature is centered at the thoracolumbar junction. Asymmetric  leftward curvature is present at L4-5. L1-2: Negative. L2-3: Mild disc bulging facet hypertrophy is present. No significant stenosis were CSF leak  is present. L3-4: A broad-based disc protrusion is present. Moderate facet hypertrophy is noted. Moderate foraminal narrowing is worse left than right. L4-5: The rightward disc protrusion is again noted. Asymmetric right sided facet hypertrophy is present. CSF contrast can be seen into the right foramen and beyond the right foramen, suggesting CSF leak. No vascular contrast is present. L5-S1: Moderate facet hypertrophy is present. Right laminectomy noted. Mild foraminal stenosis is worse right than left. No significant extra canalicular CSF contrast is present. CT THORACIC MYELOGRAM FINDINGS: Comparison made with MRI of the thoracic spine 12/25/2021. Twelve rib-bearing thoracic type vertebral bodies are present. Cord morphology is within normal limits. Alignment is anatomic. Thoracic kyphosis is preserved. Extensive fibrotic changes are again seen within both lungs. More consolidated airspace disease is present in the medial right lower lobe. Airways are patent. Atherosclerotic calcifications are present in the aortic arch and descending thoracic aorta. No aneurysm is present. 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 Small amount of contrast extends into the perineural root sleeve cysts bilaterally at T5-6 and T6-7. No other significant extra canalicular CSF contrast is present in the thoracic spine. IMPRESSION: 1. Broad-based disc protrusion at L4-5 with asymmetric right-sided facet hypertrophy resulting in moderate foraminal stenosis bilaterally. 2. CSF contrast extends into the right foramen at L4-5 and beyond the right foramen, suggesting CSF leak. 3. 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 These can be associated with CSF leaks without definite  leak. 4. Right laminectomy at L5-S1 with mild foraminal stenosis, worse right than left. 5. Multilevel spondylosis of the cervical spine with mild central and moderate to severe foraminal stenosis at C5-6. 6. Mild right foraminal narrowing at C2-3 and C3-4. 7. Moderate left foraminal stenosis at C3-4. 8. Moderate foraminal stenosis at L3-4 is worse left than right. 9. Pulmonary fibrosis. 10. More consolidated airspace disease in the right lower lobe is concerning for pneumonia. 11. Aortic Atherosclerosis (ICD10-I70.0). Electronically Signed   By: San Morelle M.D.   On: 12/27/2021 16:30   CT LUMBAR SPINE W CONTRAST  Result Date: 12/27/2021 CLINICAL DATA:  Positional headaches. 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 FLUOROSCOPY: Radiation Exposure Index (as provided by the fluoroscopic device): Dose area product 109.57 uGy*m2 PROCEDURE: LUMBAR PUNCTURE FOR CERVICAL LUMBAR AND THORACIC MYELOGRAM CERVICAL AND LUMBAR AND THORACIC MYELOGRAM CT CERVICAL MYELOGRAM CT LUMBAR MYELOGRAM CT THORACIC MYELOGRAM After thorough discussion of risks and benefits of the procedure including bleeding, infection, injury to nerves, blood vessels, adjacent structures as well as headache and CSF leak, written and oral informed consent was obtained. Consent was obtained by Ascencion Dike, PA Patient was positioned prone on the fluoroscopy table. Local anesthesia was provided with 1% lidocaine without epinephrine after prepped and draped in the usual sterile fashion. Puncture was performed at L2-3 using a 3 1/2 inch 22-gauge spinal needle via right paramedian approach. Using a single pass through the dura, the needle was placed within the thecal sac, with return of clear CSF. 10 mL Isovue M-300 was injected into the thecal sac, with normal opacification of the nerve roots and cauda equina consistent with free flow within the subarachnoid space. The patient was then moved to the trendelenburg position and contrast flowed  into the Thoracic and Cervical spine regions. I personally supervised the lumbar puncture performed by Ascencion Dike. I personally administered the intrathecal contrast. I also personally  supervised acquisition of the myelogram images. TECHNIQUE: Contiguous axial images were obtained through the Cervical, Thoracic, and Lumbar spine after the intrathecal infusion of infusion. Coronal and sagittal reconstructions were obtained of the axial image sets. FINDINGS: MYELOGRAM FINDINGS: We were unable to adequately position the patient for myelogram images. Contrast is not well seen below the L4-5 level. No extra canalicular contrast is visualized. Contrast was followed into the cervical spine. CT CERVICAL MYELOGRAM FINDINGS: Slight degenerative anterolisthesis is present at C2-3 and C3-4. Straightening of the normal cervical lordosis is present. Atherosclerotic calcifications are present at the carotid bifurcations, right greater than left. Soft tissues the neck are otherwise unremarkable. C2-3: Asymmetric right-sided facet hypertrophy is present. Mild right foraminal narrowing is present. Soft disc protrusion partially effaces the ventral CSF. C3-4: A shallow central disc protrusion partially effaces the ventral CSF. Advanced facet hypertrophy is present. Moderate left foraminal stenosis is present. C4-5: A rightward disc osteophyte complex is present. There is some impression on the right side of the cord. C5-6: A broad-based disc osteophyte complex is present. This partially effaces the ventral CSF. Moderate foraminal stenosis is present bilaterally. A broad-based disc osteophyte complex is present. The canal is narrowed to 9 mm. Moderate to severe foraminal stenosis is present bilaterally. C7-T1: Moderate facet hypertrophy is present bilaterally. No significant stenosis is present. No significant CSF contrast is present in the foramina or beyond to suggest a CSF leak. CT LUMBAR MYELOGRAM FINDINGS: Comparison made with  MRI of lumbar spine without contrast 12/25/2021. Five non rib-bearing lumbar type vertebral bodies are present. Conus terminates at L1. Vertebral body heights are normal. No significant listhesis is present. Atherosclerotic calcifications are present in the aorta without aneurysm. Mild rightward curvature is centered at the thoracolumbar junction. Asymmetric leftward curvature is present at L4-5. L1-2: Negative. L2-3: Mild disc bulging facet hypertrophy is present. No significant stenosis were CSF leak is present. L3-4: A broad-based disc protrusion is present. Moderate facet hypertrophy is noted. Moderate foraminal narrowing is worse left than right. L4-5: The rightward disc protrusion is again noted. Asymmetric right sided facet hypertrophy is present. CSF contrast can be seen into the right foramen and beyond the right foramen, suggesting CSF leak. No vascular contrast is present. L5-S1: Moderate facet hypertrophy is present. Right laminectomy noted. Mild foraminal stenosis is worse right than left. No significant extra canalicular CSF contrast is present. CT THORACIC MYELOGRAM FINDINGS: Comparison made with MRI of the thoracic spine 12/25/2021. Twelve rib-bearing thoracic type vertebral bodies are present. Cord morphology is within normal limits. Alignment is anatomic. Thoracic kyphosis is preserved. Extensive fibrotic changes are again seen within both lungs. More consolidated airspace disease is present in the medial right lower lobe. Airways are patent. Atherosclerotic calcifications are present in the aortic arch and descending thoracic aorta. No aneurysm is present. 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 Small amount of contrast extends into the perineural root sleeve cysts bilaterally at T5-6 and T6-7. No other significant extra canalicular CSF contrast is present in the thoracic spine. IMPRESSION: 1. Broad-based disc protrusion at L4-5 with asymmetric right-sided  facet hypertrophy resulting in moderate foraminal stenosis bilaterally. 2. CSF contrast extends into the right foramen at L4-5 and beyond the right foramen, suggesting CSF leak. 3. 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 These can be associated with CSF leaks without definite leak. 4. Right laminectomy at L5-S1  with mild foraminal stenosis, worse right than left. 5. Multilevel spondylosis of the cervical spine with mild central and moderate to severe foraminal stenosis at C5-6. 6. Mild right foraminal narrowing at C2-3 and C3-4. 7. Moderate left foraminal stenosis at C3-4. 8. Moderate foraminal stenosis at L3-4 is worse left than right. 9. Pulmonary fibrosis. 10. More consolidated airspace disease in the right lower lobe is concerning for pneumonia. 11. Aortic Atherosclerosis (ICD10-I70.0). Electronically Signed   By: San Morelle M.D.   On: 12/27/2021 16:30   EEG adult  Result Date: 12/27/2021 Lora Havens, MD     12/27/2021  7:56 PM Patient Name: Jenny Kelly MRN: [id number] Epilepsy Attending: Lora Havens Referring Physician/Provider: Lorenza Chick, MD Date: 12/27/2021 Duration: 23.24 mins Patient history: 76 yo WF with hx of afib, on xarelto, hx of CVA with residual right sided weakness, hx of cluster HA and migraines, CKD stage 3a, COPD, pulmonary fibrosis, seizure disorder, presented to ER with bitemporal headache today. EEG to evaluate for seizure Level of alertness: Awake AEDs during EEG study: TPM, PGB Technical aspects: This EEG study was done with scalp electrodes positioned according to the 10-20 International system of electrode placement. Electrical activity was reviewed with band pass filter of 1-_0 , sensitivity of 7 uV/mm, display speed of 28m/sec with a _1  notched filter applied as appropriate. EEG data were recorded continuously and digitally stored.  Video monitoring was available and  reviewed as appropriate. Description: No posterior dominant rhythm was seen. EEG showed continuous generalized 3 to 6 Hz theta-delta slowing. Generalized periodic discharges with triphasic morphology at 1 Hz were also noted intermittently. Hyperventilation and photic stimulation were not performed.    ABNORMALITY - Periodic discharges with triphasic morphology, generalized ( GPDs) - Continuous slow, generalized  IMPRESSION: This study showed generalized periodic discharges with triphasic morphology which is on the ictal-interictal continuum. However, the morphology and frequency is more commonly indicative of toxic-metabolic causes. If suspicion for interictal activity remains a concern, a prolonged study can be considered.  There is also moderate diffuse encephalopathy, nonspecific etiology. No seizures were seen throughout the recording. PLora Havens  DG FL GUIDED LUMBAR PUNCTURE  Result Date: 12/27/2021 CLINICAL DATA:  Positional headaches. EXAM: DIAGNOSTIC LUMBAR PUNCTURE UNDER FLUOROSCOPIC GUIDANCE COMPARISON:  MR head without and with contrast 12/27/2021. MRI lumbar spine without contrast 12/25/2021 FLUOROSCOPY: Radiation Exposure Index (as provided by the fluoroscopic device): Dose area product 50.98 uGy*m2 PROCEDURE: Informed consent was obtained from the patient prior to the procedure, including potential complications of headache, allergy, and pain. With the patient prone, the lower back was prepped with Betadine. 1% Lidocaine was used for local anesthesia. Lumbar puncture was performed at the right paramedian L2-3 level using a 20 gauge needle with return of clear CSF. The pressure was too low to reach the top of the 9 cm needle. 1.5 ml of CSF was gently aspirated while monitoring for patient's symptoms. This was sent for laboratory studies. The patient tolerated the procedure well and there were no apparent complications. IMPRESSION: Technically successful fluoroscopic guided lumbar puncture at  L2-3. Below normal pressure. CSF pressure was less than 9 cm with the patient in the prone position. Lateral decubitus measurements were not able to be obtained due to the patient's condition. Electronically Signed   By: CSan MorelleM.D.   On: 12/27/2021 14:24   MR BRAIN W WO CONTRAST  Result Date: 12/27/2021 CLINICAL DATA:  Concern for CSF leak, headache EXAM: MRI HEAD WITHOUT AND  WITH CONTRAST TECHNIQUE: Multiplanar, multiecho pulse sequences of the brain and surrounding structures were obtained without and with intravenous contrast. CONTRAST:  76m GADAVIST GADOBUTROL 1 MMOL/ML IV SOLN COMPARISON:  12/23/2021 MRI head without contrast FINDINGS: Brain: No new restricted diffusion to suggest acute or subacute infarct. Redemonstrated diffusion signal abnormality in the right basal ganglia, unchanged from 12/23/2021 and most likely related to the prior infarct than reflecting additional acute infarcts. Redemonstrated encephalomalacia and gliosis involving the right MCA territory, including the right basal ganglia and insula. Redemonstrated hemosiderin staining in the regions, likely related to petechial hemorrhage. No new area of hemosiderin deposition. No acute hemorrhage, mass, mass effect, or midline shift. No hydrocephalus or extra-axial collection. No abnormal parenchymal or meningeal enhancement. Normal size of the pituitary. Normal craniocervical junction. Vascular: Normal arterial flow voids. Normal arterial and venous enhancement. No evidence of expansion of the distal transverse venous sinuses at the transverse-sigmoid junction. Skull and upper cervical spine: Normal marrow signal. Sinuses/Orbits: Small air-fluid level in the right maxillary sinus. Mild mucosal thickening in the ethmoid air cells. Status post bilateral lens replacements. Other: Fluid in the mastoid air cells. IMPRESSION: 1. No acute intracranial process. No evidence of acute or subacute infarct. No abnormal parenchymal or  meningeal enhancement. 2. No findings suggestive of spontaneous intracranial hypotension or CSF leak. No dural thickening/enhancement, enlargement of the pituitary gland, or expansion of the venous sinuses. 3. Air-fluid level in the right maxillary sinus, as can be seen with acute sinusitis. Correlate with symptoms. Electronically Signed   By: AMerilyn BabaM.D.   On: 12/27/2021 01:12     Medical Consultants:   None.   Subjective:    Jenny VIRAMONTESrelates she has not noticed any difference after blood patch  Objective:    Vitals:   12/27/21 2010 12/28/21 0040 12/28/21 0042 12/28/21 0402  BP: (!) 106/52  (!) 108/56 (!) 114/43  Pulse: 92 89 72 69  Resp: _0 Temp: 98.3 F (36.8 C)  98.6 F (37 C) 98.3 F (36.8 C)  TempSrc: Oral  Oral Oral  SpO2: 91% 95% 94% 93%  Weight:      Height:       SpO2: 93 %   Intake/Output Summary (Last 24 hours) at 12/28/2021 0738 Last data filed at 12/28/2021 0630 Gross per 24 hour  Intake 240 ml  Output 950 ml  Net -710 ml    Filed Weights   12/25/21 0500 12/26/21 0437 12/27/21 0430  Weight: 60.2 kg 60.1 kg 60.1 kg    Exam: General exam: In no acute distress. Respiratory system: Good air movement and clear to auscultation. Cardiovascular system: S1 & S2 heard, RRR. No JVD. Gastrointestinal system: Abdomen is nondistended, soft and nontender.  Extremities: No pedal edema. Skin: No rashes, lesions or ulcers Psychiatry: Judgement and insight appear normal. Mood & affect appropriate. Data Reviewed:    Labs: Basic Metabolic Panel: Recent Labs  Lab 12/22/21 2223 12/22/21 2228 12/25/21 0341 12/27/21 0824  NA 143 143 141 143  K 4.2 4.1 3.9 3.5  CL 107 110 110 114*  CO2 22  --  25 21*  GLUCOSE 127* 124* 121* 94  BUN 28* 28* 32* 26*  CREATININE 1.50* 1.50* 1.29* 1.14*  CALCIUM 9.3  --  8.9 8.7*    GFR Estimated Creatinine Clearance: 33.2 mL/min (A) (by C-G formula based on SCr of 1.14 mg/dL (H)). Liver Function  Tests: Recent Labs  Lab 12/22/21 2223 12/27/21 0824  AST 25 12*  ALT 9 11  ALKPHOS 81 81  BILITOT 0.5 0.6  PROT 6.9 6.0*  ALBUMIN 2.7* 2.1*    No results for input(s): "LIPASE", "AMYLASE" in the last 168 hours. No results for input(s): "AMMONIA" in the last 168 hours. Coagulation profile No results for input(s): "INR", "PROTIME" in the last 168 hours. COVID-19 Labs  Recent Labs    12/27/21 0824  CRP 22.7*     Lab Results  Component Value Date   SARSCOV2NAA NEGATIVE 12/22/2021   SARSCOV2NAA NEGATIVE 11/01/2021   [place] NEGATIVE 07/01/2021   [place] NEGATIVE 05/01/2021    CBC: Recent Labs  Lab 12/22/21 2223 12/22/21 2228 12/25/21 0341 12/27/21 0824  WBC 15.7*  --  13.3* 21.4*  NEUTROABS 11.5*  --   --  14.9*  HGB 13.1 13.9 11.5* 12.6  HCT 41.2 41.0 36.2 39.0  MCV 89.8  --  87.9 85.9  PLT 213  --  214 223    Cardiac Enzymes: No results for input(s): "CKTOTAL", "CKMB", "CKMBINDEX", "TROPONINI" in the last 168 hours. BNP (last 3 results) No results for input(s): "PROBNP" in the last 8760 hours. CBG: Recent Labs  Lab 12/27/21 0605 12/27/21 1125 12/27/21 1640 12/27/21 2129 12/28/21 0603  GLUCAP 79 105* 127* 100* 112*    D-Dimer: No results for input(s): "DDIMER" in the last 72 hours. Hgb A1c: No results for input(s): "HGBA1C" in the last 72 hours. Lipid Profile: No results for input(s): "CHOL", "HDL", "LDLCALC", "TRIG", "CHOLHDL", "LDLDIRECT" in the last 72 hours. Thyroid function studies: No results for input(s): "TSH", "T4TOTAL", "T3FREE", "THYROIDAB" in the last 72 hours.  Invalid input(s): "FREET3" Anemia work up: No results for input(s): "VITAMINB12", "FOLATE", "FERRITIN", "TIBC", "IRON", "RETICCTPCT" in the last 72 hours. Sepsis Labs: Recent Labs  Lab 12/22/21 2223 12/25/21 0341 12/27/21 0824  WBC 15.7* 13.3* 21.4*    Microbiology Recent Results (from the past 240 hour(s))  Resp Panel by RT-PCR (Flu A&B, Covid) Anterior  Nasal Swab     Status: None   Collection Time: 12/22/21  9:55 PM   Specimen: Anterior Nasal Swab  Result Value Ref Range Status   SARS Coronavirus 2 by RT PCR NEGATIVE NEGATIVE Final    Comment: (NOTE) SARS-CoV-2 target nucleic acids are NOT DETECTED.  The SARS-CoV-2 RNA is generally detectable in upper respiratory specimens during the acute phase of infection. The lowest concentration of SARS-CoV-2 viral copies this assay can detect is 138 copies/mL. A negative result does not preclude SARS-Cov-2 infection and should not be used as the sole basis for treatment or other patient management decisions. A negative result may occur with  improper specimen collection/handling, submission of specimen other than nasopharyngeal swab, presence of viral mutation(s) within the areas targeted by this assay, and inadequate number of viral copies(<138 copies/mL). A negative result must be combined with clinical observations, patient history, and epidemiological information. The expected result is Negative.  Fact Sheet for Patients:  EntrepreneurPulse.com.au  Fact Sheet for Healthcare Providers:  IncredibleEmployment.be  This test is no t yet approved or cleared by the [place] FDA and  has been authorized for detection and/or diagnosis of SARS-CoV-2 by FDA under an Emergency Use Authorization (EUA). This EUA will remain  in effect (meaning this test can be used) for the duration of the COVID-19 declaration under Section 564(b)(1) of the Act, 21 U.S.C.section 360bbb-3(b)(1), unless the authorization is terminated  or revoked sooner.       Influenza A by PCR NEGATIVE NEGATIVE Final   Influenza B by PCR  NEGATIVE NEGATIVE Final    Comment: (NOTE) The Xpert Xpress SARS-CoV-2/FLU/RSV plus assay is intended as an aid in the diagnosis of influenza from Nasopharyngeal swab specimens and should not be used as a sole basis for treatment. Nasal washings  and aspirates are unacceptable for Xpert Xpress SARS-CoV-2/FLU/RSV testing.  Fact Sheet for Patients: EntrepreneurPulse.com.au  Fact Sheet for Healthcare Providers: IncredibleEmployment.be  This test is not yet approved or cleared by the [place] FDA and has been authorized for detection and/or diagnosis of SARS-CoV-2 by FDA under an Emergency Use Authorization (EUA). This EUA will remain in effect (meaning this test can be used) for the duration of the COVID-19 declaration under Section 564(b)(1) of the Act, 21 U.S.C. section 360bbb-3(b)(1), unless the authorization is terminated or revoked.  Performed at [facility], [place] [address], [place], [place] [address]   Surgical pcr screen     Status: Abnormal   Collection Time: 12/26/21 11:59 PM   Specimen: Nasal Mucosa; Nasal Swab  Result Value Ref Range Status   MRSA, PCR NEGATIVE NEGATIVE Final   Staphylococcus aureus POSITIVE (A) NEGATIVE Final    Comment: (NOTE) The Xpert SA Assay (FDA approved for NASAL specimens in patients 54 years of age and older), is one component of a comprehensive surveillance program. It is not intended to diagnose infection nor to guide or monitor treatment. Performed at [facility], [place] [address] [place], [place] [address]   CSF culture w Gram Stain     Status: None (Preliminary result)   Collection Time: 12/27/21  1:20 PM   Specimen: CSF; Cerebrospinal Fluid  Result Value Ref Range Status   Specimen Description CSF  Final   Special Requests NONE  Final   Gram Stain   Final    WBC PRESENT,BOTH PMN AND MONONUCLEAR NO ORGANISMS SEEN CYTOSPIN SMEAR Performed at [facility], [place] [address] [place], [place] [address]    Culture PENDING  Incomplete   Report Status PENDING  Incomplete     Medications:    aspirin EC  81 mg Oral Daily   caffeine  200 mg Oral Q4H   Chlorhexidine Gluconate Cloth  6 each Topical Q0600   donepezil   10 mg Oral QHS   doxycycline  100 mg Oral Q12H   guaiFENesin  600 mg Oral BID    HYDROmorphone (DILAUDID) injection  1 mg Intravenous Once   insulin aspart  0-15 Units Subcutaneous TID WC   levothyroxine  88 mcg Oral QAC breakfast   montelukast  10 mg Oral QHS   mupirocin ointment  1 Application Nasal BID   pantoprazole  40 mg Oral BID   polyethylene glycol  17 g Oral Daily   polyethylene glycol  17 g Oral BID   pregabalin  50 mg Oral BID   topiramate  200 mg Oral BID   Continuous Infusions:  sodium chloride 75 mL/hr at 12/27/21 1710      LOS: 4 days   Charlynne Cousins  [facility]  12/28/2021, 7:38 AM

## 2021-12-29 ENCOUNTER — Inpatient Hospital Stay (HOSPITAL_COMMUNITY): Payer: Medicare HMO

## 2021-12-29 DIAGNOSIS — G9600 Cerebrospinal fluid leak, unspecified: Secondary | ICD-10-CM | POA: Diagnosis not present

## 2021-12-29 DIAGNOSIS — R569 Unspecified convulsions: Secondary | ICD-10-CM

## 2021-12-29 LAB — GLUCOSE, CAPILLARY
Glucose-Capillary: 126 mg/dL — ABNORMAL HIGH (ref 70–99)
Glucose-Capillary: 117 mg/dL — ABNORMAL HIGH (ref 70–99)
Glucose-Capillary: 104 mg/dL — ABNORMAL HIGH (ref 70–99)
Glucose-Capillary: 115 mg/dL — ABNORMAL HIGH (ref 70–99)

## 2021-12-29 LAB — HEPARIN LEVEL (UNFRACTIONATED)
Heparin Unfractionated: <0.1 IU/mL — ABNORMAL LOW (ref 0.30–0.70)
Heparin Unfractionated: 0.16 IU/mL — ABNORMAL LOW (ref 0.30–0.70)

## 2021-12-29 LAB — APTT: aPTT: 42 seconds — ABNORMAL HIGH (ref 24–36)

## 2021-12-29 MED ORDER — HEPARIN (PORCINE) 25000 UT/250ML-% IV SOLN
950.0000 [IU]/h | INTRAVENOUS | Status: AC
  Administered 2021-12-29: 850 [IU]/h via INTRAVENOUS
  Administered 2021-12-30 – 2021-12-31 (×2): 950 [IU]/h via INTRAVENOUS
  Filled 2021-12-29 (×4): qty 250

## 2021-12-29 MED ORDER — POLYETHYLENE GLYCOL 3350 17 G PO PACK
17.0000 g | PACK | Freq: Two times a day (BID) | ORAL | Status: AC
  Administered 2021-12-29 – 2021-12-30 (×3): 17 g via ORAL
  Filled 2021-12-29 (×4): qty 1

## 2021-12-29 MED ORDER — DOCUSATE SODIUM 100 MG PO CAPS
100.0000 mg | ORAL_CAPSULE | Freq: Two times a day (BID) | ORAL | Status: AC
  Administered 2021-12-29 – 2021-12-30 (×3): 100 mg via ORAL
  Filled 2021-12-29 (×3): qty 1

## 2021-12-29 NOTE — Consult Note (Addendum)
   [facility] CM Inpatient Consult   12/29/2021  Brionne F Wohler 11/04/1945 [id number]  [facility] [[facility]]  Accountable Care Organization [ACO] Patient: [employer]   Primary Care Provider:  Van Eyk, Jason, MD, [facility] [place]  Reviewed for extreme high risk score for unplanned readmmission  Patient screened for hospitalization with noted previously active [facility]  [[facility]] Care Management service needs for post hospital transition.   Met with patient at bedside, lights low and patient in supine position, with eyes closed, spoke with patient and she opens eyes quietly speaking, This writer explained reason for visit, and  then lab personnel entered, patient able to give name and DOB. An appointment reminder card and 24 hour nurse advise line magnet placed with papers on windowsill.   Plan:  Continue to follow progress and disposition to assess for post hospital care management needs.    For questions contact:    , RN BSN CCM [facility] Hospital Liaison  [phone] business mobile phone Toll free office [phone]  Fax number: [phone] [email or web address]     

## 2021-12-29 NOTE — Progress Notes (Signed)
LTM EEG hooked up and running - no initial skin breakdown - push button tested - neuro notified. [facility] monitoring.  

## 2021-12-29 NOTE — Progress Notes (Signed)
Neurology Progress Note   S:// Still complaining of severe, sharp headache, no change from yesterday and having difficulty localizing pain. Poor appetite. Patient was adjusted to lay on her right side this AM. C/o pain at surgical site and head when moving.  O:// Current vital signs: BP (!) 106/48 (BP Location: Left Arm)   Pulse 70   Temp 98.4 F (36.9 C) (Oral)   Resp 16   Ht 5\' 2"  (1.575 m)   Wt 60.1 kg   SpO2 96%   BMI 24.23 kg/m  Vital signs in last 24 hours: Temp:  [97.5 F (36.4 C)-98.6 F (37 C)] 98.4 F (36.9 C) (09/08 0808) Pulse Rate:  [64-75] 70 (09/08 0808) Resp:  [16-18] 16 (09/08 0808) BP: (106-123)/(48-59) 106/48 (09/08 0808) SpO2:  [94 %-100 %] 96 % (09/08 0808)  GENERAL: Awake, drowsy in NAD HEENT: - Normocephalic and atraumatic.  Headache persists, severe LUNGS -breathing comfortably with no audible wheezing CV -perfusing extremities well  NEURO:  Mental Status: Drowsy, awakens briefly, reports she is at [facility], very perseverative on prior answers, poor attention / concentration, some word finding difficulty Language: speech is clear and fluent.  Language exam limited by pain and attention  Cranial Nerves: PERRL EOMI, visual fields full  no facial asymmetry, hearing intact, tongue/uvula/soft palate midline,  Motor: Minimal pronator drift of the left upper extremity > right upper extremity. Weakness noted in LLE, drift and hits bed. Sensation- Intact to light touch throughout and equal on both sides  On later attending evaluation, patient is slightly more awake but continues to have poor attention/concentration, slight left facial droop, marked drift of the left upper extremity but no drift of the left lower extremity  Medications  Current Facility-Administered Medications:    acetaminophen (TYLENOL) tablet 650 mg, 650 mg, Oral, Q6H PRN, 650 mg at 12/25/21 1632 **OR** acetaminophen (TYLENOL) suppository 650 mg, 650 mg, Rectal, Q6H PRN, 02/24/22,  DO   aspirin EC tablet 81 mg, 81 mg, Oral, Daily, Carollee Herter, DO, 81 mg at 12/29/21 0901   caffeine tablet 200 mg, 200 mg, Oral, Q4H, Breindy Meadow L, MD, 200 mg at 12/29/21 0901   Chlorhexidine Gluconate Cloth 2 % PADS 6 each, 6 each, Topical, Q0600, 02/28/22, MD, 6 each at 12/29/21 0901   docusate sodium (COLACE) capsule 100 mg, 100 mg, Oral, BID, 02/28/22, MD   donepezil (ARICEPT) tablet 10 mg, 10 mg, Oral, QHS, Chen, Eric, DO, 10 mg at 12/28/21 2137   doxycycline (VIBRA-TABS) tablet 100 mg, 100 mg, Oral, Q12H, 2138, Gagan S, MD, 100 mg at 12/29/21 0901   guaiFENesin (MUCINEX) 12 hr tablet 600 mg, 600 mg, Oral, BID, 02/28/22, Gagan S, MD, 600 mg at 12/29/21 0901   HYDROmorphone (DILAUDID) injection 1 mg, 1 mg, Intravenous, Once, 02/28/22, [place], MD   insulin aspart (novoLOG) injection 0-15 Units, 0-15 Units, Subcutaneous, TID WC, Kirin Brandenburger L, MD, 2 Units at 12/29/21 02/28/22   ipratropium-albuterol (DUONEB) 0.5-2.5 (3) MG/3ML nebulizer solution 3 mL, 3 mL, Nebulization, Q6H PRN, 8413, DO   levothyroxine (SYNTHROID) tablet 88 mcg, 88 mcg, Oral, QAC breakfast, Carollee Herter, DO, 88 mcg at 12/29/21 0630   montelukast (SINGULAIR) tablet 10 mg, 10 mg, Oral, QHS, Chen, Eric, DO, 10 mg at 12/28/21 2137   mupirocin ointment (BACTROBAN) 2 % 1 Application, 1 Application, Nasal, BID, 2138, Sharl Ma, MD, 1 Application at 12/29/21 0902   ondansetron (ZOFRAN) tablet 4 mg, 4 mg, Oral, Q6H PRN **OR**  ondansetron (ZOFRAN) injection 4 mg, 4 mg, Intravenous, Q6H PRN, Kristopher Oppenheim, DO   Oral care mouth rinse, 15 mL, Mouth Rinse, PRN, Darrick Meigs, Marge Duncans, MD   oxyCODONE (Oxy IR/ROXICODONE) immediate release tablet 5 mg, 5 mg, Oral, Q4H PRN, Tavares Levinson L, MD, 5 mg at 12/29/21 0635   pantoprazole (PROTONIX) EC tablet 40 mg, 40 mg, Oral, BID, Kristopher Oppenheim, DO, 40 mg at 12/29/21 0901   polyethylene glycol (MIRALAX / GLYCOLAX) packet 17 g, 17 g, Oral, BID, Charlynne Cousins, MD   pregabalin (LYRICA)  capsule 50 mg, 50 mg, Oral, BID, Kristopher Oppenheim, DO, 50 mg at 12/29/21 0901   topiramate (TOPAMAX) tablet 200 mg, 200 mg, Oral, BID, Kristopher Oppenheim, DO, 200 mg at 12/29/21 0901 Labs   Basic Metabolic Panel: Recent Labs  Lab 12/22/21 2223 12/22/21 2228 12/25/21 0341 12/27/21 0824  NA 143 143 141 143  K 4.2 4.1 3.9 3.5  CL 107 110 110 114*  CO2 22  --  25 21*  GLUCOSE 127* 124* 121* 94  BUN 28* 28* 32* 26*  CREATININE 1.50* 1.50* 1.29* 1.14*  CALCIUM 9.3  --  8.9 8.7*     CBC: Recent Labs  Lab 12/22/21 2223 12/22/21 2228 12/25/21 0341 12/27/21 0824  WBC 15.7*  --  13.3* 21.4*  NEUTROABS 11.5*  --   --  14.9*  HGB 13.1 13.9 11.5* 12.6  HCT 41.2 41.0 36.2 39.0  MCV 89.8  --  87.9 85.9  PLT 213  --  214 223     Lab Results  Component Value Date   ESRSEDRATE 57 (H) 12/23/2021   Lab Results  Component Value Date   ESRSEDRATE 60 (H) 12/27/2021    Lab Results  Component Value Date   CRP 21.3 (H) 12/23/2021   Lab Results  Component Value Date   CRP 22.7 (H) 12/27/2021      Imaging I have reviewed images in epic and the results pertinent to this consultation are:  MRI brain w/ and w/o 9/6 personally reviewed, agree with radiology:   1. No acute intracranial process. No evidence of acute or subacute infarct. No abnormal parenchymal or meningeal enhancement. 2. No findings suggestive of spontaneous intracranial hypotension or CSF leak. No dural thickening/enhancement, enlargement of the pituitary gland, or expansion of the venous sinuses. 3. Air-fluid level in the right maxillary sinus, as can be seen with acute sinusitis. Correlate with symptoms.  CT-scan of the brain 1. No acute intracranial pathology. 2. Old right MCA territory infarct and encephalomalacia. 3. Paranasal sinus disease and right mastoid effusion.   MRI examination of the brain 9/2 1. No acute intracranial abnormality. 2. Chronic right MCA distribution infarct with underlying mild chronic  microvascular ischemic disease.  [place] bilateral temporal - Absence of a "halo" sign in the bilateral temporal artery, although not  definitive, makes a diagnosis of temporal arteritis   MR THORACIC SPINE IMPRESSION No acute findings in the thoracic spine.  No significant stenosis. Re-reviewed with Neuroradiology today, in particular possible leak at the right T9 nerve root though this is not a definite finding   MR LUMBAR SPINE IMPRESSION Multilevel degenerative changes of the lumbar spine, worst from L3 through S1, or some was below: L3-L4: Mild spinal canal and bilateral subarticular stenosis. Moderate left and mild-to-moderate right neural foraminal stenosis. L4-L5: Severe right neural foraminal stenosis likely impinging the exiting nerve root. Moderate right and subarticular stenosis encroaching the descending L5 nerve root. Mild left subarticular and moderate left neural  foraminal stenosis. L5-S1: Degenerative grade 1 anterolisthesis. Moderate left and mild-to-moderate right neural foraminal stenosis. There are small perineural cysts on the left at T7-T8 and T8-T9 and on the right at T9-T10. Tiny right-sided perineural cyst at S2.  CT myleogram personally reviewed, agree with radiology:   1. Broad-based disc protrusion at L4-5 with asymmetric right-sided facet hypertrophy resulting in moderate foraminal stenosis bilaterally. 2. CSF contrast extends into the right foramen at L4-5 and beyond the right foramen, suggesting CSF leak. 3. 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 These can be associated with CSF leaks without definite leak. 4. Right laminectomy at L5-S1 with mild foraminal stenosis, worse right than left. 5. Multilevel spondylosis of the cervical spine with mild central and moderate to severe foraminal stenosis at C5-6. 6. Mild right foraminal narrowing at C2-3 and C3-4. 7. Moderate left  foraminal stenosis at C3-4. 8. Moderate foraminal stenosis at L3-4 is worse left than right. 9. Pulmonary fibrosis. 10. More consolidated airspace disease in the right lower lobe is concerning for pneumonia. 11. Aortic Atherosclerosis (ICD10-I70.0).  EEG  - Periodic discharges with triphasic morphology, generalized ( GPDs) - Continuous slow, generalized    Latest Reference Range & Units 12/27/21 13:20  Appearance, CSF CLEAR  HAZY !  Glucose, CSF 40 - 70 mg/dL 56  RBC Count, CSF 0 /cu mm 2,475 (H)  WBC, CSF 0 - 5 /cu mm 4  Other Cells, CSF  TOO FEW TO COUNT, SMEAR AVAILABLE FOR REVIEW  Color, CSF COLORLESS  PINK !  Supernatant  CLEAR  Total  Protein, CSF 15 - 45 mg/dL 81 (H)  Tube #  1  !: Data is abnormal (H): Data is abnormally high  Assessment:  76 yo WF with hx of afib, on xarelto, hx of CVA with residual right sided weakness, hx of cluster HA and migraines, CKD stage 3a, COPD, pulmonary fibrosis, seizure disorder, presented to ER with bitemporal headache today.  Initial concern was for possible GCA given her elevated inflammatory markers, and she was treated with 3 days of pulse dose steroids (9/2-9/4) without improvement in her headache.  On observation her headache has a severe significant positional quality highly concerning for CSF leak headache, but no etiology has been identified on MRI T or L-spine. MRI Brain not c/w CSF leak headache findings, but this is a poorly sensitive exam so proceeded to CT Myelogram which identified a likely leak in the lumbar spine. Greatly appreciate blood patch performed by Dr. Jobe Igo on 9/6; CSF sampled at the time c/w low pressure (CSF had to be obtained with gentle aspiration and c/w traumatic tap with culture negative to date).    Based on her participation with examination her headache seems roughly stable from yesterday, and again has been at bedside agrees with this assessment.  However she continues to have fluctuating left-sided weakness  for which we will obtain long-term EEG monitoring to rule out intermittent seizure activity  Impression: Right sided sharp headache, likely low pressure headache s/p blood patch  Recommendations:  #Headache, suspect low pressure CSF headache secondary to spontaneous leak -Continue bedrest for today -oxycodone weaned from 10 mg to 5 mg due to delirium and somnolence -continue caffeine scheduled with hold parameter for HR > 110 or BP > 180/105 -Heparin drip for atrial fibrillation in case repeat procedure is needed; pharmacy consult placed -appreciate IVF per primary team  #Fluctuating left-sided weakness -Long-term EEG monitoring  to rule out intermittent seizure activity -Neurology will continue to follow   Brooke Dare MD-PhD [facility] [phone] Available 7 AM to 7 PM, outside these hours please contact Neurologist on call listed on AMION

## 2021-12-29 NOTE — Progress Notes (Signed)
[facility] PROGRESS NOTE    Progress Note  Jenny Kelly  XFG:[id number] DOB: 07/02/1945 DOA: 12/22/2021 PCP: Townsend Roger, MD     Brief Narrative:   Jenny Kelly is an 76 y.o. female past medical history of atrial fibrillation on Xarelto, history of CVA with right residual weakness, migraines chronic kidney disease stage IIIa, pulmonary fibrosis seizure disorder came into the ED for bitemporal headaches associated with photophobia.  Patient is on Topamax and Lyrica for history of seizures and headache. In the ED, ESR and CRP were elevated neurology was consulted.  MRI of the brain and CT of the head were negative.  Due to elevated markers and bitemporal headache temporal arteritis was considered ultrasound did not show halo sign vascular surgery was consulted for possible biopsy Xarelto is on hold for anticipation of biopsy on 12/27/2021. CT myelogram showed CSF contrast extending into the right foraminal L4-L5.  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 MRI of the brain suggestive findings for intracranial hypotension or CSF leak.   CT myelogram showed possible CSF leak. Neurology consulted IR status post blood patch on 12/27/2021. Further management per neurology. Neurology recommended supine bedrest for 24 hours, oxycodone decreased due to delirium. Continue caffeine schedule. Continue to hold Xarelto. Has not had a bowel movement started on a bowel regimen  Acute bronchitis/pulmonary fibrosis: Currently not on oxygen has basilar crackles continue Doxy flutter valve and Mucinex. Sputum cultures has been negative.  History of CVA with residual deficits: Noted.  Paroxysmal atrial fibrillation: Xarelto on hold, neurology to dictate when to restart.  Chronic disease stage IIIa: Creatinine at baseline.  Chronic diastolic heart failure: Appears euvolemic and  stable.  COPD: Stable.   DVT prophylaxis: SCD Family Communication:husband Status is: Inpatient Remains inpatient appropriate because: headache    Code Status:     Code Status Orders  (From admission, onward)           Start     Ordered   12/23/21 0811  Do not attempt resuscitation (DNR)  Continuous       Question Answer Comment  In the event of cardiac or respiratory ARREST Do not call a "code blue"   In the event of cardiac or respiratory ARREST Do not perform Intubation, CPR, defibrillation or ACLS   In the event of cardiac or respiratory ARREST Use medication by any route, position, wound care, and other measures to relive pain and suffering. May use oxygen, suction and manual treatment of airway obstruction as needed for comfort.      12/23/21 0810           Code Status History     Date Active Date Inactive Code Status Order ID Comments User Context   11/01/2021 0815 11/04/2021 1854 DNR [id number]  Norval Morton, MD ED   07/01/2021 [phone] 07/07/2021 1851 DNR [id number]  Karmen Bongo, MD ED   05/07/2021 1433 05/16/2021 1732 Partial Code [id number]  Charlett Blake, MD Inpatient   05/07/2021 1431 05/07/2021 1433 Full Code [id number]  Charlett Blake, MD Inpatient   05/01/2021 1755 05/07/2021 1357 Partial Code [id number] Intubation short term only if needed Germain Osgood, PA-C ED         IV Access:   Peripheral IV   Procedures and diagnostic studies:   DG Myelogram 2+ Regions  Result Date: 12/27/2021 CLINICAL DATA:  Positional headaches. Perineural root  sleeve cyst on the right at T9-10. FLUOROSCOPY: Radiation Exposure Index (as provided by the fluoroscopic device): Dose area product 109.57 uGy*m2 PROCEDURE: LUMBAR PUNCTURE FOR CERVICAL LUMBAR AND THORACIC MYELOGRAM CERVICAL AND LUMBAR AND THORACIC MYELOGRAM CT CERVICAL MYELOGRAM CT LUMBAR MYELOGRAM CT THORACIC MYELOGRAM After thorough discussion of risks and benefits of the procedure including bleeding,  infection, injury to nerves, blood vessels, adjacent structures as well as headache and CSF leak, written and oral informed consent was obtained. Consent was obtained by Ascencion Dike, PA Patient was positioned prone on the fluoroscopy table. Local anesthesia was provided with 1% lidocaine without epinephrine after prepped and draped in the usual sterile fashion. Puncture was performed at L2-3 using a 3 1/2 inch 22-gauge spinal needle via right paramedian approach. Using a single pass through the dura, the needle was placed within the thecal sac, with return of clear CSF. 10 mL Isovue M-300 was injected into the thecal sac, with normal opacification of the nerve roots and cauda equina consistent with free flow within the subarachnoid space. The patient was then moved to the trendelenburg position and contrast flowed into the Thoracic and Cervical spine regions. I personally supervised the lumbar puncture performed by Ascencion Dike. I personally administered the intrathecal contrast. I also personally supervised acquisition of the myelogram images. TECHNIQUE: Contiguous axial images were obtained through the Cervical, Thoracic, and Lumbar spine after the intrathecal infusion of infusion. Coronal and sagittal reconstructions were obtained of the axial image sets. FINDINGS: MYELOGRAM FINDINGS: We were unable to adequately position the patient for myelogram images. Contrast is not well seen below the L4-5 level. No extra canalicular contrast is visualized. Contrast was followed into the cervical spine. CT CERVICAL MYELOGRAM FINDINGS: Slight degenerative anterolisthesis is present at C2-3 and C3-4. Straightening of the normal cervical lordosis is present. Atherosclerotic calcifications are present at the carotid bifurcations, right greater than left. Soft tissues the neck are otherwise unremarkable. C2-3: Asymmetric right-sided facet hypertrophy is present. Mild right foraminal narrowing is present. Soft disc protrusion  partially effaces the ventral CSF. C3-4: A shallow central disc protrusion partially effaces the ventral CSF. Advanced facet hypertrophy is present. Moderate left foraminal stenosis is present. C4-5: A rightward disc osteophyte complex is present. There is some impression on the right side of the cord. C5-6: A broad-based disc osteophyte complex is present. This partially effaces the ventral CSF. Moderate foraminal stenosis is present bilaterally. A broad-based disc osteophyte complex is present. The canal is narrowed to 9 mm. Moderate to severe foraminal stenosis is present bilaterally. C7-T1: Moderate facet hypertrophy is present bilaterally. No significant stenosis is present. No significant CSF contrast is present in the foramina or beyond to suggest a CSF leak. CT LUMBAR MYELOGRAM FINDINGS: Comparison made with MRI of lumbar spine without contrast 12/25/2021. Five non rib-bearing lumbar type vertebral bodies are present. Conus terminates at L1. Vertebral body heights are normal. No significant listhesis is present. Atherosclerotic calcifications are present in the aorta without aneurysm. Mild rightward curvature is centered at the thoracolumbar junction. Asymmetric leftward curvature is present at L4-5. L1-2: Negative. L2-3: Mild disc bulging facet hypertrophy is present. No significant stenosis were CSF leak is present. L3-4: A broad-based disc protrusion is present. Moderate facet hypertrophy is noted. Moderate foraminal narrowing is worse left than right. L4-5: The rightward disc protrusion is again noted. Asymmetric right sided facet hypertrophy is present. CSF contrast can be seen into the right foramen and beyond the right foramen, suggesting CSF leak. No vascular contrast is present. L5-S1:  Moderate facet hypertrophy is present. Right laminectomy noted. Mild foraminal stenosis is worse right than left. No significant extra canalicular CSF contrast is present. CT THORACIC MYELOGRAM FINDINGS: Comparison  made with MRI of the thoracic spine 12/25/2021. Twelve rib-bearing thoracic type vertebral bodies are present. Cord morphology is within normal limits. Alignment is anatomic. Thoracic kyphosis is preserved. Extensive fibrotic changes are again seen within both lungs. More consolidated airspace disease is present in the medial right lower lobe. Airways are patent. Atherosclerotic calcifications are present in the aortic arch and descending thoracic aorta. No aneurysm is present. 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 Small amount of contrast extends into the perineural root sleeve cysts bilaterally at T5-6 and T6-7. No other significant extra canalicular CSF contrast is present in the thoracic spine. IMPRESSION: 1. Broad-based disc protrusion at L4-5 with asymmetric right-sided facet hypertrophy resulting in moderate foraminal stenosis bilaterally. 2. CSF contrast extends into the right foramen at L4-5 and beyond the right foramen, suggesting CSF leak. 3. 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 These can be associated with CSF leaks without definite leak. 4. Right laminectomy at L5-S1 with mild foraminal stenosis, worse right than left. 5. Multilevel spondylosis of the cervical spine with mild central and moderate to severe foraminal stenosis at C5-6. 6. Mild right foraminal narrowing at C2-3 and C3-4. 7. Moderate left foraminal stenosis at C3-4. 8. Moderate foraminal stenosis at L3-4 is worse left than right. 9. Pulmonary fibrosis. 10. More consolidated airspace disease in the right lower lobe is concerning for pneumonia. 11. Aortic Atherosclerosis (ICD10-I70.0). Electronically Signed   By: San Morelle M.D.   On: 12/27/2021 16:30   CT CERVICAL SPINE W CONTRAST  Result Date: 12/27/2021 CLINICAL DATA:  Positional headaches. 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  FLUOROSCOPY: Radiation Exposure Index (as provided by the fluoroscopic device): Dose area product 109.57 uGy*m2 PROCEDURE: LUMBAR PUNCTURE FOR CERVICAL LUMBAR AND THORACIC MYELOGRAM CERVICAL AND LUMBAR AND THORACIC MYELOGRAM CT CERVICAL MYELOGRAM CT LUMBAR MYELOGRAM CT THORACIC MYELOGRAM After thorough discussion of risks and benefits of the procedure including bleeding, infection, injury to nerves, blood vessels, adjacent structures as well as headache and CSF leak, written and oral informed consent was obtained. Consent was obtained by Ascencion Dike, PA Patient was positioned prone on the fluoroscopy table. Local anesthesia was provided with 1% lidocaine without epinephrine after prepped and draped in the usual sterile fashion. Puncture was performed at L2-3 using a 3 1/2 inch 22-gauge spinal needle via right paramedian approach. Using a single pass through the dura, the needle was placed within the thecal sac, with return of clear CSF. 10 mL Isovue M-300 was injected into the thecal sac, with normal opacification of the nerve roots and cauda equina consistent with free flow within the subarachnoid space. The patient was then moved to the trendelenburg position and contrast flowed into the Thoracic and Cervical spine regions. I personally supervised the lumbar puncture performed by Ascencion Dike. I personally administered the intrathecal contrast. I also personally supervised acquisition of the myelogram images. TECHNIQUE: Contiguous axial images were obtained through the Cervical, Thoracic, and Lumbar spine after the intrathecal infusion of infusion. Coronal and sagittal reconstructions were obtained of the axial image sets. FINDINGS: MYELOGRAM FINDINGS: We were unable to adequately position the patient for myelogram images. Contrast is not well seen below the L4-5 level. No  extra canalicular contrast is visualized. Contrast was followed into the cervical spine. CT CERVICAL MYELOGRAM FINDINGS: Slight degenerative  anterolisthesis is present at C2-3 and C3-4. Straightening of the normal cervical lordosis is present. Atherosclerotic calcifications are present at the carotid bifurcations, right greater than left. Soft tissues the neck are otherwise unremarkable. C2-3: Asymmetric right-sided facet hypertrophy is present. Mild right foraminal narrowing is present. Soft disc protrusion partially effaces the ventral CSF. C3-4: A shallow central disc protrusion partially effaces the ventral CSF. Advanced facet hypertrophy is present. Moderate left foraminal stenosis is present. C4-5: A rightward disc osteophyte complex is present. There is some impression on the right side of the cord. C5-6: A broad-based disc osteophyte complex is present. This partially effaces the ventral CSF. Moderate foraminal stenosis is present bilaterally. A broad-based disc osteophyte complex is present. The canal is narrowed to 9 mm. Moderate to severe foraminal stenosis is present bilaterally. C7-T1: Moderate facet hypertrophy is present bilaterally. No significant stenosis is present. No significant CSF contrast is present in the foramina or beyond to suggest a CSF leak. CT LUMBAR MYELOGRAM FINDINGS: Comparison made with MRI of lumbar spine without contrast 12/25/2021. Five non rib-bearing lumbar type vertebral bodies are present. Conus terminates at L1. Vertebral body heights are normal. No significant listhesis is present. Atherosclerotic calcifications are present in the aorta without aneurysm. Mild rightward curvature is centered at the thoracolumbar junction. Asymmetric leftward curvature is present at L4-5. L1-2: Negative. L2-3: Mild disc bulging facet hypertrophy is present. No significant stenosis were CSF leak is present. L3-4: A broad-based disc protrusion is present. Moderate facet hypertrophy is noted. Moderate foraminal narrowing is worse left than right. L4-5: The rightward disc protrusion is again noted. Asymmetric right sided facet  hypertrophy is present. CSF contrast can be seen into the right foramen and beyond the right foramen, suggesting CSF leak. No vascular contrast is present. L5-S1: Moderate facet hypertrophy is present. Right laminectomy noted. Mild foraminal stenosis is worse right than left. No significant extra canalicular CSF contrast is present. CT THORACIC MYELOGRAM FINDINGS: Comparison made with MRI of the thoracic spine 12/25/2021. Twelve rib-bearing thoracic type vertebral bodies are present. Cord morphology is within normal limits. Alignment is anatomic. Thoracic kyphosis is preserved. Extensive fibrotic changes are again seen within both lungs. More consolidated airspace disease is present in the medial right lower lobe. Airways are patent. Atherosclerotic calcifications are present in the aortic arch and descending thoracic aorta. No aneurysm is present. 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 Small amount of contrast extends into the perineural root sleeve cysts bilaterally at T5-6 and T6-7. No other significant extra canalicular CSF contrast is present in the thoracic spine. IMPRESSION: 1. Broad-based disc protrusion at L4-5 with asymmetric right-sided facet hypertrophy resulting in moderate foraminal stenosis bilaterally. 2. CSF contrast extends into the right foramen at L4-5 and beyond the right foramen, suggesting CSF leak. 3. 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 These can be associated with CSF leaks without definite leak. 4. Right laminectomy at L5-S1 with mild foraminal stenosis, worse right than left. 5. Multilevel spondylosis of the cervical spine with mild central and moderate to severe foraminal stenosis at C5-6. 6. Mild right foraminal narrowing at C2-3 and C3-4. 7. Moderate left foraminal stenosis at C3-4. 8. Moderate foraminal stenosis at L3-4 is worse left than right. 9. Pulmonary  fibrosis. 10. More consolidated  airspace disease in the right lower lobe is concerning for pneumonia. 11. Aortic Atherosclerosis (ICD10-I70.0). Electronically Signed   By: San Morelle M.D.   On: 12/27/2021 16:30   CT THORACIC SPINE W CONTRAST  Result Date: 12/27/2021 CLINICAL DATA:  Positional headaches. 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 FLUOROSCOPY: Radiation Exposure Index (as provided by the fluoroscopic device): Dose area product 109.57 uGy*m2 PROCEDURE: LUMBAR PUNCTURE FOR CERVICAL LUMBAR AND THORACIC MYELOGRAM CERVICAL AND LUMBAR AND THORACIC MYELOGRAM CT CERVICAL MYELOGRAM CT LUMBAR MYELOGRAM CT THORACIC MYELOGRAM After thorough discussion of risks and benefits of the procedure including bleeding, infection, injury to nerves, blood vessels, adjacent structures as well as headache and CSF leak, written and oral informed consent was obtained. Consent was obtained by Ascencion Dike, PA Patient was positioned prone on the fluoroscopy table. Local anesthesia was provided with 1% lidocaine without epinephrine after prepped and draped in the usual sterile fashion. Puncture was performed at L2-3 using a 3 1/2 inch 22-gauge spinal needle via right paramedian approach. Using a single pass through the dura, the needle was placed within the thecal sac, with return of clear CSF. 10 mL Isovue M-300 was injected into the thecal sac, with normal opacification of the nerve roots and cauda equina consistent with free flow within the subarachnoid space. The patient was then moved to the trendelenburg position and contrast flowed into the Thoracic and Cervical spine regions. I personally supervised the lumbar puncture performed by Ascencion Dike. I personally administered the intrathecal contrast. I also personally supervised acquisition of the myelogram images. TECHNIQUE: Contiguous axial images were obtained through the Cervical, Thoracic, and Lumbar spine after the intrathecal infusion of infusion.  Coronal and sagittal reconstructions were obtained of the axial image sets. FINDINGS: MYELOGRAM FINDINGS: We were unable to adequately position the patient for myelogram images. Contrast is not well seen below the L4-5 level. No extra canalicular contrast is visualized. Contrast was followed into the cervical spine. CT CERVICAL MYELOGRAM FINDINGS: Slight degenerative anterolisthesis is present at C2-3 and C3-4. Straightening of the normal cervical lordosis is present. Atherosclerotic calcifications are present at the carotid bifurcations, right greater than left. Soft tissues the neck are otherwise unremarkable. C2-3: Asymmetric right-sided facet hypertrophy is present. Mild right foraminal narrowing is present. Soft disc protrusion partially effaces the ventral CSF. C3-4: A shallow central disc protrusion partially effaces the ventral CSF. Advanced facet hypertrophy is present. Moderate left foraminal stenosis is present. C4-5: A rightward disc osteophyte complex is present. There is some impression on the right side of the cord. C5-6: A broad-based disc osteophyte complex is present. This partially effaces the ventral CSF. Moderate foraminal stenosis is present bilaterally. A broad-based disc osteophyte complex is present. The canal is narrowed to 9 mm. Moderate to severe foraminal stenosis is present bilaterally. C7-T1: Moderate facet hypertrophy is present bilaterally. No significant stenosis is present. No significant CSF contrast is present in the foramina or beyond to suggest a CSF leak. CT LUMBAR MYELOGRAM FINDINGS: Comparison made with MRI of lumbar spine without contrast 12/25/2021. Five non rib-bearing lumbar type vertebral bodies are present. Conus terminates at L1. Vertebral body heights are normal. No significant listhesis is present. Atherosclerotic calcifications are present in the aorta without aneurysm. Mild rightward curvature is centered at the thoracolumbar junction. Asymmetric leftward  curvature is present at L4-5. L1-2: Negative. L2-3: Mild disc bulging facet hypertrophy is present. No significant stenosis were CSF leak is present. L3-4: A broad-based disc protrusion is present. Moderate facet hypertrophy is  noted. Moderate foraminal narrowing is worse left than right. L4-5: The rightward disc protrusion is again noted. Asymmetric right sided facet hypertrophy is present. CSF contrast can be seen into the right foramen and beyond the right foramen, suggesting CSF leak. No vascular contrast is present. L5-S1: Moderate facet hypertrophy is present. Right laminectomy noted. Mild foraminal stenosis is worse right than left. No significant extra canalicular CSF contrast is present. CT THORACIC MYELOGRAM FINDINGS: Comparison made with MRI of the thoracic spine 12/25/2021. Twelve rib-bearing thoracic type vertebral bodies are present. Cord morphology is within normal limits. Alignment is anatomic. Thoracic kyphosis is preserved. Extensive fibrotic changes are again seen within both lungs. More consolidated airspace disease is present in the medial right lower lobe. Airways are patent. Atherosclerotic calcifications are present in the aortic arch and descending thoracic aorta. No aneurysm is present. 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 Small amount of contrast extends into the perineural root sleeve cysts bilaterally at T5-6 and T6-7. No other significant extra canalicular CSF contrast is present in the thoracic spine. IMPRESSION: 1. Broad-based disc protrusion at L4-5 with asymmetric right-sided facet hypertrophy resulting in moderate foraminal stenosis bilaterally. 2. CSF contrast extends into the right foramen at L4-5 and beyond the right foramen, suggesting CSF leak. 3. 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 These can be associated with CSF leaks without definite leak. 4.  Right laminectomy at L5-S1 with mild foraminal stenosis, worse right than left. 5. Multilevel spondylosis of the cervical spine with mild central and moderate to severe foraminal stenosis at C5-6. 6. Mild right foraminal narrowing at C2-3 and C3-4. 7. Moderate left foraminal stenosis at C3-4. 8. Moderate foraminal stenosis at L3-4 is worse left than right. 9. Pulmonary fibrosis. 10. More consolidated airspace disease in the right lower lobe is concerning for pneumonia. 11. Aortic Atherosclerosis (ICD10-I70.0). Electronically Signed   By: San Morelle M.D.   On: 12/27/2021 16:30   CT LUMBAR SPINE W CONTRAST  Result Date: 12/27/2021 CLINICAL DATA:  Positional headaches. 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 FLUOROSCOPY: Radiation Exposure Index (as provided by the fluoroscopic device): Dose area product 109.57 uGy*m2 PROCEDURE: LUMBAR PUNCTURE FOR CERVICAL LUMBAR AND THORACIC MYELOGRAM CERVICAL AND LUMBAR AND THORACIC MYELOGRAM CT CERVICAL MYELOGRAM CT LUMBAR MYELOGRAM CT THORACIC MYELOGRAM After thorough discussion of risks and benefits of the procedure including bleeding, infection, injury to nerves, blood vessels, adjacent structures as well as headache and CSF leak, written and oral informed consent was obtained. Consent was obtained by Ascencion Dike, PA Patient was positioned prone on the fluoroscopy table. Local anesthesia was provided with 1% lidocaine without epinephrine after prepped and draped in the usual sterile fashion. Puncture was performed at L2-3 using a 3 1/2 inch 22-gauge spinal needle via right paramedian approach. Using a single pass through the dura, the needle was placed within the thecal sac, with return of clear CSF. 10 mL Isovue M-300 was injected into the thecal sac, with normal opacification of the nerve roots and cauda equina consistent with free flow within the subarachnoid space. The patient was then moved to the trendelenburg position and contrast flowed into the  Thoracic and Cervical spine regions. I personally supervised the lumbar puncture performed by Ascencion Dike. I personally administered the intrathecal contrast. I also personally supervised acquisition of the myelogram images. TECHNIQUE: Contiguous axial images were obtained through  the Cervical, Thoracic, and Lumbar spine after the intrathecal infusion of infusion. Coronal and sagittal reconstructions were obtained of the axial image sets. FINDINGS: MYELOGRAM FINDINGS: We were unable to adequately position the patient for myelogram images. Contrast is not well seen below the L4-5 level. No extra canalicular contrast is visualized. Contrast was followed into the cervical spine. CT CERVICAL MYELOGRAM FINDINGS: Slight degenerative anterolisthesis is present at C2-3 and C3-4. Straightening of the normal cervical lordosis is present. Atherosclerotic calcifications are present at the carotid bifurcations, right greater than left. Soft tissues the neck are otherwise unremarkable. C2-3: Asymmetric right-sided facet hypertrophy is present. Mild right foraminal narrowing is present. Soft disc protrusion partially effaces the ventral CSF. C3-4: A shallow central disc protrusion partially effaces the ventral CSF. Advanced facet hypertrophy is present. Moderate left foraminal stenosis is present. C4-5: A rightward disc osteophyte complex is present. There is some impression on the right side of the cord. C5-6: A broad-based disc osteophyte complex is present. This partially effaces the ventral CSF. Moderate foraminal stenosis is present bilaterally. A broad-based disc osteophyte complex is present. The canal is narrowed to 9 mm. Moderate to severe foraminal stenosis is present bilaterally. C7-T1: Moderate facet hypertrophy is present bilaterally. No significant stenosis is present. No significant CSF contrast is present in the foramina or beyond to suggest a CSF leak. CT LUMBAR MYELOGRAM FINDINGS: Comparison made with MRI of  lumbar spine without contrast 12/25/2021. Five non rib-bearing lumbar type vertebral bodies are present. Conus terminates at L1. Vertebral body heights are normal. No significant listhesis is present. Atherosclerotic calcifications are present in the aorta without aneurysm. Mild rightward curvature is centered at the thoracolumbar junction. Asymmetric leftward curvature is present at L4-5. L1-2: Negative. L2-3: Mild disc bulging facet hypertrophy is present. No significant stenosis were CSF leak is present. L3-4: A broad-based disc protrusion is present. Moderate facet hypertrophy is noted. Moderate foraminal narrowing is worse left than right. L4-5: The rightward disc protrusion is again noted. Asymmetric right sided facet hypertrophy is present. CSF contrast can be seen into the right foramen and beyond the right foramen, suggesting CSF leak. No vascular contrast is present. L5-S1: Moderate facet hypertrophy is present. Right laminectomy noted. Mild foraminal stenosis is worse right than left. No significant extra canalicular CSF contrast is present. CT THORACIC MYELOGRAM FINDINGS: Comparison made with MRI of the thoracic spine 12/25/2021. Twelve rib-bearing thoracic type vertebral bodies are present. Cord morphology is within normal limits. Alignment is anatomic. Thoracic kyphosis is preserved. Extensive fibrotic changes are again seen within both lungs. More consolidated airspace disease is present in the medial right lower lobe. Airways are patent. Atherosclerotic calcifications are present in the aortic arch and descending thoracic aorta. No aneurysm is present. 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 Small amount of contrast extends into the perineural root sleeve cysts bilaterally at T5-6 and T6-7. No other significant extra canalicular CSF contrast is present in the thoracic spine. IMPRESSION: 1. Broad-based disc protrusion at L4-5 with asymmetric right-sided facet  hypertrophy resulting in moderate foraminal stenosis bilaterally. 2. CSF contrast extends into the right foramen at L4-5 and beyond the right foramen, suggesting CSF leak. 3. 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 These can be associated with CSF leaks without definite leak. 4. Right laminectomy at L5-S1 with mild foraminal stenosis, worse right than left. 5. Multilevel spondylosis of the  cervical spine with mild central and moderate to severe foraminal stenosis at C5-6. 6. Mild right foraminal narrowing at C2-3 and C3-4. 7. Moderate left foraminal stenosis at C3-4. 8. Moderate foraminal stenosis at L3-4 is worse left than right. 9. Pulmonary fibrosis. 10. More consolidated airspace disease in the right lower lobe is concerning for pneumonia. 11. Aortic Atherosclerosis (ICD10-I70.0). Electronically Signed   By: San Morelle M.D.   On: 12/27/2021 16:30   EEG adult  Result Date: 12/27/2021 Lora Havens, MD     12/27/2021  7:56 PM Patient Name: Jenny Kelly MRN: [id number] Epilepsy Attending: Lora Havens Referring Physician/Provider: Lorenza Chick, MD Date: 12/27/2021 Duration: 23.24 mins Patient history: 76 yo WF with hx of afib, on xarelto, hx of CVA with residual right sided weakness, hx of cluster HA and migraines, CKD stage 3a, COPD, pulmonary fibrosis, seizure disorder, presented to ER with bitemporal headache today. EEG to evaluate for seizure Level of alertness: Awake AEDs during EEG study: TPM, PGB Technical aspects: This EEG study was done with scalp electrodes positioned according to the 10-20 International system of electrode placement. Electrical activity was reviewed with band pass filter of 1-_0 , sensitivity of 7 uV/mm, display speed of 72m/sec with a _1  notched filter applied as appropriate. EEG data were recorded continuously and digitally stored.  Video monitoring was available and reviewed  as appropriate. Description: No posterior dominant rhythm was seen. EEG showed continuous generalized 3 to 6 Hz theta-delta slowing. Generalized periodic discharges with triphasic morphology at 1 Hz were also noted intermittently. Hyperventilation and photic stimulation were not performed.    ABNORMALITY - Periodic discharges with triphasic morphology, generalized ( GPDs) - Continuous slow, generalized  IMPRESSION: This study showed generalized periodic discharges with triphasic morphology which is on the ictal-interictal continuum. However, the morphology and frequency is more commonly indicative of toxic-metabolic causes. If suspicion for interictal activity remains a concern, a prolonged study can be considered.  There is also moderate diffuse encephalopathy, nonspecific etiology. No seizures were seen throughout the recording. PLora Havens  DG FL GUIDED LUMBAR PUNCTURE  Result Date: 12/27/2021 CLINICAL DATA:  Positional headaches. EXAM: DIAGNOSTIC LUMBAR PUNCTURE UNDER FLUOROSCOPIC GUIDANCE COMPARISON:  MR head without and with contrast 12/27/2021. MRI lumbar spine without contrast 12/25/2021 FLUOROSCOPY: Radiation Exposure Index (as provided by the fluoroscopic device): Dose area product 50.98 uGy*m2 PROCEDURE: Informed consent was obtained from the patient prior to the procedure, including potential complications of headache, allergy, and pain. With the patient prone, the lower back was prepped with Betadine. 1% Lidocaine was used for local anesthesia. Lumbar puncture was performed at the right paramedian L2-3 level using a 20 gauge needle with return of clear CSF. The pressure was too low to reach the top of the 9 cm needle. 1.5 ml of CSF was gently aspirated while monitoring for patient's symptoms. This was sent for laboratory studies. The patient tolerated the procedure well and there were no apparent complications. IMPRESSION: Technically successful fluoroscopic guided lumbar puncture at L2-3.  Below normal pressure. CSF pressure was less than 9 cm with the patient in the prone position. Lateral decubitus measurements were not able to be obtained due to the patient's condition. Electronically Signed   By: CSan MorelleM.D.   On: 12/27/2021 14:24     Medical Consultants:   None.   Subjective:    DRAKHI ROMAGNOLIrelates her pain is now better.  Objective:    Vitals:   12/28/21 1935 12/28/21 2314 12/29/21  0344 12/29/21 0808  BP: (!) 122/59 (!) 123/55 (!) 108/52 (!) 106/48  Pulse: 75 66 64 70  Resp: _0 Temp: 98.6 F (37 C) 98.4 F (36.9 C) (!) 97.5 F (36.4 C) 98.4 F (36.9 C)  TempSrc: Oral Oral Oral Oral  SpO2: 97% 97% 94% 96%  Weight:      Height:       SpO2: 96 %   Intake/Output Summary (Last 24 hours) at 12/29/2021 0908 Last data filed at 12/29/2021 0655 Gross per 24 hour  Intake 720 ml  Output 800 ml  Net -80 ml    Filed Weights   12/25/21 0500 12/26/21 0437 12/27/21 0430  Weight: 60.2 kg 60.1 kg 60.1 kg    Exam: General exam: In no acute distress. Respiratory system: Good air movement and clear to auscultation. Cardiovascular system: S1 & S2 heard, RRR. No JVD. Gastrointestinal system: Abdomen is nondistended, soft and nontender.  Extremities: No pedal edema. Skin: No rashes, lesions or ulcers Psychiatry: Judgement and insight appear normal. Mood & affect appropriate. Data Reviewed:    Labs: Basic Metabolic Panel: Recent Labs  Lab 12/22/21 2223 12/22/21 2228 12/25/21 0341 12/27/21 0824  NA 143 143 141 143  K 4.2 4.1 3.9 3.5  CL 107 110 110 114*  CO2 22  --  25 21*  GLUCOSE 127* 124* 121* 94  BUN 28* 28* 32* 26*  CREATININE 1.50* 1.50* 1.29* 1.14*  CALCIUM 9.3  --  8.9 8.7*    GFR Estimated Creatinine Clearance: 33.2 mL/min (A) (by C-G formula based on SCr of 1.14 mg/dL (H)). Liver Function Tests: Recent Labs  Lab 12/22/21 2223 12/27/21 0824  AST 25 12*  ALT 9 11  ALKPHOS 81 81  BILITOT 0.5 0.6  PROT 6.9  6.0*  ALBUMIN 2.7* 2.1*    No results for input(s): "LIPASE", "AMYLASE" in the last 168 hours. No results for input(s): "AMMONIA" in the last 168 hours. Coagulation profile No results for input(s): "INR", "PROTIME" in the last 168 hours. COVID-19 Labs  Recent Labs    12/27/21 0824  CRP 22.7*     Lab Results  Component Value Date   SARSCOV2NAA NEGATIVE 12/22/2021   SARSCOV2NAA NEGATIVE 11/01/2021   [place] NEGATIVE 07/01/2021   [place] NEGATIVE 05/01/2021    CBC: Recent Labs  Lab 12/22/21 2223 12/22/21 2228 12/25/21 0341 12/27/21 0824  WBC 15.7*  --  13.3* 21.4*  NEUTROABS 11.5*  --   --  14.9*  HGB 13.1 13.9 11.5* 12.6  HCT 41.2 41.0 36.2 39.0  MCV 89.8  --  87.9 85.9  PLT 213  --  214 223    Cardiac Enzymes: No results for input(s): "CKTOTAL", "CKMB", "CKMBINDEX", "TROPONINI" in the last 168 hours. BNP (last 3 results) No results for input(s): "PROBNP" in the last 8760 hours. CBG: Recent Labs  Lab 12/28/21 0603 12/28/21 1115 12/28/21 1701 12/28/21 2109 12/29/21 0626  GLUCAP 112* 123* 109* 136* 126*    D-Dimer: No results for input(s): "DDIMER" in the last 72 hours. Hgb A1c: No results for input(s): "HGBA1C" in the last 72 hours. Lipid Profile: No results for input(s): "CHOL", "HDL", "LDLCALC", "TRIG", "CHOLHDL", "LDLDIRECT" in the last 72 hours. Thyroid function studies: No results for input(s): "TSH", "T4TOTAL", "T3FREE", "THYROIDAB" in the last 72 hours.  Invalid input(s): "FREET3" Anemia work up: No results for input(s): "VITAMINB12", "FOLATE", "FERRITIN", "TIBC", "IRON", "RETICCTPCT" in the last 72 hours. Sepsis Labs: Recent Labs  Lab 12/22/21 2223 12/25/21 0341 12/27/21  0824  WBC 15.7* 13.3* 21.4*    Microbiology Recent Results (from the past 240 hour(s))  Resp Panel by RT-PCR (Flu A&B, Covid) Anterior Nasal Swab     Status: None   Collection Time: 12/22/21  9:55 PM   Specimen: Anterior Nasal Swab  Result Value Ref Range  Status   SARS Coronavirus 2 by RT PCR NEGATIVE NEGATIVE Final    Comment: (NOTE) SARS-CoV-2 target nucleic acids are NOT DETECTED.  The SARS-CoV-2 RNA is generally detectable in upper respiratory specimens during the acute phase of infection. The lowest concentration of SARS-CoV-2 viral copies this assay can detect is 138 copies/mL. A negative result does not preclude SARS-Cov-2 infection and should not be used as the sole basis for treatment or other patient management decisions. A negative result may occur with  improper specimen collection/handling, submission of specimen other than nasopharyngeal swab, presence of viral mutation(s) within the areas targeted by this assay, and inadequate number of viral copies(<138 copies/mL). A negative result must be combined with clinical observations, patient history, and epidemiological information. The expected result is Negative.  Fact Sheet for Patients:  EntrepreneurPulse.com.au  Fact Sheet for Healthcare Providers:  IncredibleEmployment.be  This test is no t yet approved or cleared by the [place] FDA and  has been authorized for detection and/or diagnosis of SARS-CoV-2 by FDA under an Emergency Use Authorization (EUA). This EUA will remain  in effect (meaning this test can be used) for the duration of the COVID-19 declaration under Section 564(b)(1) of the Act, 21 U.S.C.section 360bbb-3(b)(1), unless the authorization is terminated  or revoked sooner.       Influenza A by PCR NEGATIVE NEGATIVE Final   Influenza B by PCR NEGATIVE NEGATIVE Final    Comment: (NOTE) The Xpert Xpress SARS-CoV-2/FLU/RSV plus assay is intended as an aid in the diagnosis of influenza from Nasopharyngeal swab specimens and should not be used as a sole basis for treatment. Nasal washings and aspirates are unacceptable for Xpert Xpress SARS-CoV-2/FLU/RSV testing.  Fact Sheet for  Patients: EntrepreneurPulse.com.au  Fact Sheet for Healthcare Providers: IncredibleEmployment.be  This test is not yet approved or cleared by the [place] FDA and has been authorized for detection and/or diagnosis of SARS-CoV-2 by FDA under an Emergency Use Authorization (EUA). This EUA will remain in effect (meaning this test can be used) for the duration of the COVID-19 declaration under Section 564(b)(1) of the Act, 21 U.S.C. section 360bbb-3(b)(1), unless the authorization is terminated or revoked.  Performed at [facility], [place] [address] [place], [place] [address]   Surgical pcr screen     Status: Abnormal   Collection Time: 12/26/21 11:59 PM   Specimen: Nasal Mucosa; Nasal Swab  Result Value Ref Range Status   MRSA, PCR NEGATIVE NEGATIVE Final   Staphylococcus aureus POSITIVE (A) NEGATIVE Final    Comment: (NOTE) The Xpert SA Assay (FDA approved for NASAL specimens in patients 63 years of age and older), is one component of a comprehensive surveillance program. It is not intended to diagnose infection nor to guide or monitor treatment. Performed at [facility], [place] [address], [place], [place] [address]   CSF culture w Gram Stain     Status: None (Preliminary result)   Collection Time: 12/27/21  1:20 PM   Specimen: CSF; Cerebrospinal Fluid  Result Value Ref Range Status   Specimen Description CSF  Final   Special Requests NONE  Final   Gram Stain   Final  WBC PRESENT,BOTH PMN AND MONONUCLEAR NO ORGANISMS SEEN CYTOSPIN SMEAR    Culture   Final    NO GROWTH < 24 HOURS Performed at [facility], [place] [address] [place], [place] [address]    Report Status PENDING  Incomplete     Medications:    aspirin EC  81 mg Oral Daily   caffeine  200 mg Oral Q4H   Chlorhexidine Gluconate Cloth  6 each Topical Q0600   donepezil  10 mg Oral QHS   doxycycline  100 mg Oral Q12H   guaiFENesin  600 mg Oral BID     HYDROmorphone (DILAUDID) injection  1 mg Intravenous Once   insulin aspart  0-15 Units Subcutaneous TID WC   levothyroxine  88 mcg Oral QAC breakfast   montelukast  10 mg Oral QHS   mupirocin ointment  1 Application Nasal BID   pantoprazole  40 mg Oral BID   polyethylene glycol  17 g Oral Daily   pregabalin  50 mg Oral BID   topiramate  200 mg Oral BID   Continuous Infusions:      LOS: 5 days   Charlynne Cousins  [facility]  12/29/2021, 9:08 AM

## 2021-12-29 NOTE — Progress Notes (Signed)
ANTICOAGULATION CONSULT NOTE - Initial Consult  Pharmacy Consult for Heparin  Indication: atrial fibrillation  Allergies  Allergen Reactions   Ciprofloxacin Hives    Ask patient   Prochlorperazine Edisylate Other (See Comments)    Cant swallow Muscle cramping   Morphine Other (See Comments)    Confusion   Prednisone Other (See Comments)    unknown   Reduced Iso-Alpha Acids Complex Other (See Comments)    unknown   Statins Other (See Comments)    Muscle weakness    Sulfa Antibiotics Swelling   Azithromycin Rash   Latex Rash    Patient Measurements: Height: 5\' 2"  (157.5 cm) Weight: 60.1 kg (132 lb 7.9 oz) IBW/kg (Calculated) : 50.1 Heparin Dosing Weight: 60.1 kg  Vital Signs: Temp: 97.9 F (36.6 C) (09/08 1119) Temp Source: Oral (09/08 1119) BP: 129/50 (09/08 1119) Pulse Rate: 63 (09/08 1119)  Labs: Recent Labs    12/27/21 0824  HGB 12.6  HCT 39.0  PLT 223  CREATININE 1.14*    Estimated Creatinine Clearance: 33.2 mL/min (A) (by C-G formula based on SCr of 1.14 mg/dL (H)).   Medical History: Past Medical History:  Diagnosis Date   Anemia    Anxiety    Arthritis    Asthma    Atrial fibrillation (HCC)    Blood transfusion without reported diagnosis    Cataract    CHF (congestive heart failure) (HCC)    Chronic kidney disease    Clotting disorder (HCC)    COPD (chronic obstructive pulmonary disease) (HCC)    Dementia (HCC)    Depression    Diabetes mellitus without complication (HCC)    Dyspnea    GERD (gastroesophageal reflux disease)    Headache    Heart murmur    History of kidney stones    Hyperlipidemia    Hypothyroidism    Myocardial infarction (HCC)    Neuromuscular disorder (HCC)    tremors   Pneumonia due to COVID-19 virus    Seizures (HCC)    Sleep apnea    Stroke (HCC)    Thyroid disease    Wide-complex tachycardia     Medications:  Medications Prior to Admission  Medication Sig Dispense Refill Last Dose   acetaminophen  (TYLENOL) 500 MG tablet Take 500 mg by mouth every 6 (six) hours as needed for mild pain or moderate pain.   12/22/2021   aspirin 81 MG EC tablet Take 1 tablet (81 mg total) by mouth daily. Swallow whole. 30 tablet 0 12/22/2021   budesonide (PULMICORT) 0.5 MG/2ML nebulizer solution Take 2 mLs (0.5 mg total) by nebulization in the morning and at bedtime. 120 mL 0 12/22/2021   cetirizine (ZYRTEC) 10 MG tablet Take 10 mg by mouth daily.   12/22/2021   docusate sodium (COLACE) 100 MG capsule Take 100 mg by mouth daily.   12/22/2021   donepezil (ARICEPT) 10 MG tablet Take 1 tablet (10 mg total) by mouth at bedtime. 30 tablet 0 12/21/2021   guaiFENesin (MUCINEX) 600 MG 12 hr tablet Take 600 mg by mouth 2 (two) times daily as needed for cough or to loosen phlegm.   12/22/2021   guaiFENesin-dextromethorphan (ROBITUSSIN DM) 100-10 MG/5ML syrup Take 10 mLs by mouth every 4 (four) hours as needed for cough. 118 mL 0 unk   levothyroxine (SYNTHROID) 88 MCG tablet Take 88 mcg by mouth daily before breakfast.   12/22/2021   montelukast (SINGULAIR) 10 MG tablet Take 1 tablet (10 mg total) by mouth at bedtime. 30  tablet 0 12/21/2021   Multiple Vitamin (MULTIVITAMIN WITH MINERALS) TABS tablet Take 1 tablet by mouth daily.   12/22/2021   Omega-3 Fatty Acids (FISH OIL) 1200 MG CAPS Take 1,200 mg by mouth 2 (two) times daily.   12/22/2021   omeprazole (PRILOSEC) 40 MG capsule Take 1 capsule (40 mg total) by mouth daily. (Patient taking differently: Take 40 mg by mouth 2 (two) times daily.) 30 capsule 0 12/22/2021   PERCOCET 5-325 MG tablet Take 1 tablet by mouth every 4 (four) hours as needed for severe pain.   12/22/2021   pregabalin (LYRICA) 50 MG capsule TAKE 1 CAPSULE BY MOUTH 2 TIMES DAILY. (Patient taking differently: Take 50 mg by mouth 2 (two) times daily.) 60 capsule 0 12/22/2021   Probiotic Product (PROBIOTIC PO) Take 15 Billion Cells by mouth daily.   12/22/2021   Rivaroxaban (XARELTO) 15 MG TABS tablet Take 1 tablet (15 mg total) by  mouth daily. 90 tablet 3 12/22/2021 at 1200   topiramate (TOPAMAX) 200 MG tablet Take 1 tablet (200 mg total) by mouth 2 (two) times daily. 60 tablet 0 12/22/2021   URINARY HEALTH/CRANBERRY PO Take 1 tablet by mouth 2 (two) times daily.   12/22/2021   pravastatin (PRAVACHOL) 80 MG tablet Take 1 tablet (80 mg total) by mouth every evening. (Patient not taking: Reported on 11/01/2021) 90 tablet 0     Assessment:  76 y.o female with history of atrial fibrillation on Xarelto PTA,  hx of CVA with residual right sided weakness, hx of cluster HA and migraines, CKD stage 3a, COPD, pulmonary fibrosis, seizure disorder, presented to ER on 12/22/21 with bitemporal headache.  Per Neurologist, headache is suspected low pressure CSF headache secondary to spontaneous leak. Neuro has  consulted pharmacist to start IV Heparin drip for atrial fibrillation in case repeat procedure is needed.  Last dose of Xarelto taken this admit on 9/3 @ 0830.   CBC wnl/stable.  Goal of Therapy:  Heparin level 0.3-0.5 units/ml Monitor platelets by anticoagulation protocol: Yes   Plan:  check  BL aPTT/HL Heparin no bolus, start heparin drip  at 850 units/hr Check 8 hr aPTT/HL, CBC Daily aPTT, HL, CBC Monitor for s/sx of bleeding  Thank you for allowing pharmacy to be part of this patients care team. Noah Delaine, RPh Clinical Pharmacist 12/29/2021,1:34 PM

## 2021-12-30 DIAGNOSIS — J9611 Chronic respiratory failure with hypoxia: Secondary | ICD-10-CM | POA: Diagnosis not present

## 2021-12-30 DIAGNOSIS — R569 Unspecified convulsions: Secondary | ICD-10-CM | POA: Diagnosis not present

## 2021-12-30 DIAGNOSIS — J449 Chronic obstructive pulmonary disease, unspecified: Secondary | ICD-10-CM | POA: Diagnosis not present

## 2021-12-30 DIAGNOSIS — R2990 Unspecified symptoms and signs involving the nervous system: Secondary | ICD-10-CM

## 2021-12-30 DIAGNOSIS — I5032 Chronic diastolic (congestive) heart failure: Secondary | ICD-10-CM | POA: Diagnosis not present

## 2021-12-30 DIAGNOSIS — R4182 Altered mental status, unspecified: Secondary | ICD-10-CM | POA: Diagnosis not present

## 2021-12-30 DIAGNOSIS — G9600 Cerebrospinal fluid leak, unspecified: Secondary | ICD-10-CM | POA: Diagnosis not present

## 2021-12-30 LAB — CSF CULTURE W GRAM STAIN: Culture: NO GROWTH

## 2021-12-30 LAB — HEPARIN LEVEL (UNFRACTIONATED)
Heparin Unfractionated: 0.3 IU/mL (ref 0.30–0.70)
Heparin Unfractionated: 0.32 IU/mL (ref 0.30–0.70)

## 2021-12-30 LAB — GLUCOSE, CAPILLARY
Glucose-Capillary: 131 mg/dL — ABNORMAL HIGH (ref 70–99)
Glucose-Capillary: 113 mg/dL — ABNORMAL HIGH (ref 70–99)
Glucose-Capillary: 115 mg/dL — ABNORMAL HIGH (ref 70–99)
Glucose-Capillary: 118 mg/dL — ABNORMAL HIGH (ref 70–99)

## 2021-12-30 MED ORDER — POLYETHYLENE GLYCOL 3350 17 G PO PACK
17.0000 g | PACK | Freq: Every day | ORAL | Status: AC
  Administered 2021-12-31 – 2022-01-03 (×3): 17 g via ORAL
  Filled 2021-12-30 (×4): qty 1

## 2021-12-30 MED ORDER — DOXYCYCLINE HYCLATE 100 MG PO TABS
100.0000 mg | ORAL_TABLET | Freq: Two times a day (BID) | ORAL | Status: AC
  Administered 2021-12-30 (×2): 100 mg via ORAL
  Filled 2021-12-30 (×2): qty 1

## 2021-12-30 MED ORDER — SORBITOL 70 % SOLN
960.0000 mL | TOPICAL_OIL | Freq: Once | ORAL | Status: AC
  Administered 2021-12-30: 960 mL via RECTAL
  Filled 2021-12-30 (×2): qty 473

## 2021-12-30 NOTE — Progress Notes (Signed)
LTM maint complete - no skin breakdown Serviced Cz, F4 F7 [facility] monitored

## 2021-12-30 NOTE — Progress Notes (Signed)
EEG maint complete.  ?

## 2021-12-30 NOTE — Progress Notes (Signed)
ANTICOAGULATION CONSULT NOTE - follow-up  Pharmacy Consult for Heparin  Indication: atrial fibrillation  Allergies  Allergen Reactions   Ciprofloxacin Hives    Ask patient   Prochlorperazine Edisylate Other (See Comments)    Cant swallow Muscle cramping   Morphine Other (See Comments)    Confusion   Prednisone Other (See Comments)    unknown   Reduced Iso-Alpha Acids Complex Other (See Comments)    unknown   Statins Other (See Comments)    Muscle weakness    Sulfa Antibiotics Swelling   Azithromycin Rash   Latex Rash    Patient Measurements: Height: 5\' 2"  (157.5 cm) Weight: 64.6 kg (142 lb 6.7 oz) IBW/kg (Calculated) : 50.1 Heparin Dosing Weight: 60.1 kg  Vital Signs: Temp: 98.3 F (36.8 C) (09/09 1158) Temp Source: Oral (09/09 1158) BP: 128/67 (09/09 1158) Pulse Rate: 78 (09/09 1158)  Labs: Recent Labs    12/29/21 1245 12/29/21 2318 12/30/21 1213  APTT 42*  --   --   HEPARINUNFRC <0.10* 0.16* 0.30     Estimated Creatinine Clearance: 37 mL/min (A) (by C-G formula based on SCr of 1.14 mg/dL (H)).   Medical History: Past Medical History:  Diagnosis Date   Anemia    Anxiety    Arthritis    Asthma    Atrial fibrillation (HCC)    Blood transfusion without reported diagnosis    Cataract    CHF (congestive heart failure) (HCC)    Chronic kidney disease    Clotting disorder (HCC)    COPD (chronic obstructive pulmonary disease) (HCC)    Dementia (HCC)    Depression    Diabetes mellitus without complication (HCC)    Dyspnea    GERD (gastroesophageal reflux disease)    Headache    Heart murmur    History of kidney stones    Hyperlipidemia    Hypothyroidism    Myocardial infarction (HCC)    Neuromuscular disorder (HCC)    tremors   Pneumonia due to COVID-19 virus    Seizures (HCC)    Sleep apnea    Stroke (HCC)    Thyroid disease    Wide-complex tachycardia     Medications:  Medications Prior to Admission  Medication Sig Dispense Refill  Last Dose   acetaminophen (TYLENOL) 500 MG tablet Take 500 mg by mouth every 6 (six) hours as needed for mild pain or moderate pain.   12/22/2021   aspirin 81 MG EC tablet Take 1 tablet (81 mg total) by mouth daily. Swallow whole. 30 tablet 0 12/22/2021   budesonide (PULMICORT) 0.5 MG/2ML nebulizer solution Take 2 mLs (0.5 mg total) by nebulization in the morning and at bedtime. 120 mL 0 12/22/2021   cetirizine (ZYRTEC) 10 MG tablet Take 10 mg by mouth daily.   12/22/2021   docusate sodium (COLACE) 100 MG capsule Take 100 mg by mouth daily.   12/22/2021   donepezil (ARICEPT) 10 MG tablet Take 1 tablet (10 mg total) by mouth at bedtime. 30 tablet 0 12/21/2021   guaiFENesin (MUCINEX) 600 MG 12 hr tablet Take 600 mg by mouth 2 (two) times daily as needed for cough or to loosen phlegm.   12/22/2021   guaiFENesin-dextromethorphan (ROBITUSSIN DM) 100-10 MG/5ML syrup Take 10 mLs by mouth every 4 (four) hours as needed for cough. 118 mL 0 unk   levothyroxine (SYNTHROID) 88 MCG tablet Take 88 mcg by mouth daily before breakfast.   12/22/2021   montelukast (SINGULAIR) 10 MG tablet Take 1 tablet (10 mg  total) by mouth at bedtime. 30 tablet 0 12/21/2021   Multiple Vitamin (MULTIVITAMIN WITH MINERALS) TABS tablet Take 1 tablet by mouth daily.   12/22/2021   Omega-3 Fatty Acids (FISH OIL) 1200 MG CAPS Take 1,200 mg by mouth 2 (two) times daily.   12/22/2021   omeprazole (PRILOSEC) 40 MG capsule Take 1 capsule (40 mg total) by mouth daily. (Patient taking differently: Take 40 mg by mouth 2 (two) times daily.) 30 capsule 0 12/22/2021   PERCOCET 5-325 MG tablet Take 1 tablet by mouth every 4 (four) hours as needed for severe pain.   12/22/2021   pregabalin (LYRICA) 50 MG capsule TAKE 1 CAPSULE BY MOUTH 2 TIMES DAILY. (Patient taking differently: Take 50 mg by mouth 2 (two) times daily.) 60 capsule 0 12/22/2021   Probiotic Product (PROBIOTIC PO) Take 15 Billion Cells by mouth daily.   12/22/2021   Rivaroxaban (XARELTO) 15 MG TABS tablet Take 1  tablet (15 mg total) by mouth daily. 90 tablet 3 12/22/2021 at 1200   topiramate (TOPAMAX) 200 MG tablet Take 1 tablet (200 mg total) by mouth 2 (two) times daily. 60 tablet 0 12/22/2021   URINARY HEALTH/CRANBERRY PO Take 1 tablet by mouth 2 (two) times daily.   12/22/2021   pravastatin (PRAVACHOL) 80 MG tablet Take 1 tablet (80 mg total) by mouth every evening. (Patient not taking: Reported on 11/01/2021) 90 tablet 0     Assessment:  76 y.o female with history of atrial fibrillation on Xarelto PTA,  hx of CVA with residual right sided weakness, hx of cluster HA and migraines, CKD stage 3a, COPD, pulmonary fibrosis, seizure disorder, presented to ER on 12/22/21 with bitemporal headache.  Per Neurologist, headache is suspected low pressure CSF headache secondary to spontaneous leak. Neuro has  consulted pharmacist to start IV Heparin drip for atrial fibrillation in case repeat procedure is needed.  Last dose of Xarelto taken this admit on 9/3 @ 0830.     12/30/2021 AM HL 0.16>0.30 with heparin running at 950 units/hour. CBC wnl/stable. No signs of bleeding or issues with infusion noted by RN.   Goal of Therapy:  Heparin level 0.3-0.5 units/ml Monitor platelets by anticoagulation protocol: Yes   Plan:  Continue heparin drip at 950 units/hr Check 8 hr HL Daily HL, CBC Monitor for s/sx of bleeding  Thank you for allowing pharmacy to be part of this patients care team.  Jani Gravel, PharmD PGY-2 Infectious Diseases Resident  12/30/2021 1:15 PM

## 2021-12-30 NOTE — Progress Notes (Signed)
LTM EEG discontinued - no skin breakdown at unhook.   

## 2021-12-30 NOTE — Progress Notes (Signed)
Neurology Progress Note   S:// Prior to sitting up she does not complain of pain with Valsalva (cough, confrontational strength testing of the legs) or worsening headache with eye movement Continues to have a severe headache that worsens when she sits up and improves when she lays down  O:// Current vital signs: BP 128/67 (BP Location: Left Arm)   Pulse 78   Temp 98.3 F (36.8 C) (Oral)   Resp 16   Ht 5\' 2"  (1.575 m)   Wt 64.6 kg   SpO2 98%   BMI 26.05 kg/m  Vital signs in last 24 hours: Temp:  [97.9 F (36.6 C)-98.3 F (36.8 C)] 98.3 F (36.8 C) (09/09 1158) Pulse Rate:  [68-93] 78 (09/09 1158) Resp:  [16] 16 (09/09 1158) BP: (126-130)/(44-67) 128/67 (09/09 1158) SpO2:  [96 %-100 %] 98 % (09/09 1158) Weight:  [64.6 kg] 64.6 kg (09/09 0500)  GENERAL: Awake, drowsy in NAD HEENT: - Normocephalic and atraumatic.  Headache persists, severe LUNGS -breathing comfortably with no audible wheezing CV -perfusing extremities well  NEURO:  Mental Status: Drowsy, awakens briefly, reports she is at [facility], poor attention / concentration Language: Language exam limited by pain and attention  Cranial Nerves: PERRL EOMI, visual fields full  no facial asymmetry, hearing intact, tongue/uvula/soft palate midline,  Motor: Pronator drift of the left upper extremity > right upper extremity.  Increased physiologic tremor today.  Left lower extremity 4/5 at hip flexion, right lower extremity 5/5 Sensation- Intact to light touch throughout and equal on both sides  Medications  Current Facility-Administered Medications:    acetaminophen (TYLENOL) tablet 650 mg, 650 mg, Oral, Q6H PRN, 650 mg at 12/30/21 0649 **OR** acetaminophen (TYLENOL) suppository 650 mg, 650 mg, Rectal, Q6H PRN, 03/01/22, DO   aspirin EC tablet 81 mg, 81 mg, Oral, Daily, Carollee Herter, DO, 81 mg at 12/30/21 0931   Chlorhexidine Gluconate Cloth 2 % PADS 6 each, 6 each, Topical, Q0600, 03/01/22, MD, 6 each at 12/30/21  0557   donepezil (ARICEPT) tablet 10 mg, 10 mg, Oral, QHS, 03/01/22, DO, 10 mg at 12/29/21 2125   doxycycline (VIBRA-TABS) tablet 100 mg, 100 mg, Oral, Q12H, 2126, MD, 100 mg at 12/30/21 0931   guaiFENesin (MUCINEX) 12 hr tablet 600 mg, 600 mg, Oral, BID, 03/01/22, Gagan S, MD, 600 mg at 12/30/21 0931   heparin ADULT infusion 100 units/mL (25000 units/247mL), 950 Units/hr, Intravenous, Continuous, Reome, Earle J, RPH, Last Rate: 9.5 mL/hr at 12/30/21 0049, 950 Units/hr at 12/30/21 0049   HYDROmorphone (DILAUDID) injection 1 mg, 1 mg, Intravenous, Once, 03/01/22, Gagan S, MD   insulin aspart (novoLOG) injection 0-15 Units, 0-15 Units, Subcutaneous, TID WC, Cheetara Hoge L, MD, 2 Units at 12/30/21 0649   ipratropium-albuterol (DUONEB) 0.5-2.5 (3) MG/3ML nebulizer solution 3 mL, 3 mL, Nebulization, Q6H PRN, 03/01/22, DO   levothyroxine (SYNTHROID) tablet 88 mcg, 88 mcg, Oral, QAC breakfast, Carollee Herter, DO, 88 mcg at 12/30/21 0649   montelukast (SINGULAIR) tablet 10 mg, 10 mg, Oral, QHS, Chen, Eric, DO, 10 mg at 12/29/21 2125   mupirocin ointment (BACTROBAN) 2 % 1 Application, 1 Application, Nasal, BID, 2126, Sharl Ma, MD, 1 Application at 12/30/21 0932   ondansetron (ZOFRAN) tablet 4 mg, 4 mg, Oral, Q6H PRN **OR** ondansetron (ZOFRAN) injection 4 mg, 4 mg, Intravenous, Q6H PRN, 03/01/22, DO   Oral care mouth rinse, 15 mL, Mouth Rinse, PRN, Carollee Herter, Sharl Ma, MD   oxyCODONE (Oxy IR/ROXICODONE) immediate release  tablet 5 mg, 5 mg, Oral, Q4H PRN, Jaquana Geiger L, MD, 5 mg at 12/30/21 0940   pantoprazole (PROTONIX) EC tablet 40 mg, 40 mg, Oral, BID, Kristopher Oppenheim, DO, 40 mg at 12/30/21 0931   polyethylene glycol (MIRALAX / GLYCOLAX) packet 17 g, 17 g, Oral, BID, Charlynne Cousins, MD, 17 g at 12/30/21 0931   pregabalin (LYRICA) capsule 50 mg, 50 mg, Oral, BID, Kristopher Oppenheim, DO, 50 mg at 12/30/21 0931   topiramate (TOPAMAX) tablet 200 mg, 200 mg, Oral, BID, Kristopher Oppenheim, DO, 200 mg at 12/30/21  0931 Labs   Basic Metabolic Panel: Recent Labs  Lab 12/25/21 0341 12/27/21 0824  NA 141 143  K 3.9 3.5  CL 110 114*  CO2 25 21*  GLUCOSE 121* 94  BUN 32* 26*  CREATININE 1.29* 1.14*  CALCIUM 8.9 8.7*     CBC: Recent Labs  Lab 12/25/21 0341 12/27/21 0824  WBC 13.3* 21.4*  NEUTROABS  --  14.9*  HGB 11.5* 12.6  HCT 36.2 39.0  MCV 87.9 85.9  PLT 214 223     Lab Results  Component Value Date   ESRSEDRATE 57 (H) 12/23/2021   Lab Results  Component Value Date   ESRSEDRATE 60 (H) 12/27/2021    Lab Results  Component Value Date   CRP 21.3 (H) 12/23/2021   Lab Results  Component Value Date   CRP 22.7 (H) 12/27/2021      Imaging I have reviewed images in epic and the results pertinent to this consultation are:  MRI brain w/ and w/o 9/6 personally reviewed, agree with radiology:   1. No acute intracranial process. No evidence of acute or subacute infarct. No abnormal parenchymal or meningeal enhancement. 2. No findings suggestive of spontaneous intracranial hypotension or CSF leak. No dural thickening/enhancement, enlargement of the pituitary gland, or expansion of the venous sinuses. 3. Air-fluid level in the right maxillary sinus, as can be seen with acute sinusitis. Correlate with symptoms.  CT-scan of the brain 1. No acute intracranial pathology. 2. Old right MCA territory infarct and encephalomalacia. 3. Paranasal sinus disease and right mastoid effusion.   MRI examination of the brain 9/2 1. No acute intracranial abnormality. 2. 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  [place] bilateral temporal - Absence of a "halo" sign in the bilateral temporal artery, although not  definitive, makes a diagnosis of temporal arteritis   MR THORACIC SPINE IMPRESSION No acute findings in the thoracic spine.  No significant stenosis. Re-reviewed with Neuroradiology today, in particular possible leak at the right  T9 nerve root though this is not a definite finding   MR LUMBAR SPINE IMPRESSION Multilevel degenerative changes of the lumbar spine, worst from L3 through S1, or some was below: L3-L4: Mild spinal canal and bilateral subarticular stenosis. Moderate left and mild-to-moderate right neural foraminal stenosis. L4-L5: Severe right neural foraminal stenosis likely impinging the exiting nerve root. Moderate right and subarticular stenosis encroaching the descending L5 nerve root. Mild left subarticular and moderate left neural foraminal stenosis. L5-S1: Degenerative grade 1 anterolisthesis. Moderate left and mild-to-moderate right neural foraminal stenosis. There are small perineural cysts on the left at T7-T8 and T8-T9 and on the right at T9-T10. Tiny right-sided perineural cyst at S2.  CT myleogram personally reviewed, agree with radiology:   1. Broad-based disc protrusion at L4-5 with asymmetric right-sided facet hypertrophy resulting in moderate foraminal stenosis bilaterally. 2. CSF contrast extends into the right foramen at L4-5 and beyond  the right foramen, suggesting CSF leak. 3. 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 These can be associated with CSF leaks without definite leak. 4. Right laminectomy at L5-S1 with mild foraminal stenosis, worse right than left. 5. Multilevel spondylosis of the cervical spine with mild central and moderate to severe foraminal stenosis at C5-6. 6. Mild right foraminal narrowing at C2-3 and C3-4. 7. Moderate left foraminal stenosis at C3-4. 8. Moderate foraminal stenosis at L3-4 is worse left than right. 9. Pulmonary fibrosis. 10. More consolidated airspace disease in the right lower lobe is concerning for pneumonia. 11. Aortic Atherosclerosis (ICD10-I70.0).  EEG 9/6 - Periodic discharges with triphasic morphology, generalized ( GPDs) - Continuous slow,  generalized EEG 9/9 1322 to 1000 No posterior dominant rhythm was seen. EEG showed continuous generalized 3 to 6 Hz theta-delta slowing, at times with triphasic morphology.  Hyperventilation and photic stimulation were not performed.     Latest Reference Range & Units 12/27/21 13:20  Appearance, CSF CLEAR  HAZY !  Glucose, CSF 40 - 70 mg/dL 56  RBC Count, CSF 0 /cu mm 2,475 (H)  WBC, CSF 0 - 5 /cu mm 4  Other Cells, CSF  TOO FEW TO COUNT, SMEAR AVAILABLE FOR REVIEW  Color, CSF COLORLESS  PINK !  Supernatant  CLEAR  Total  Protein, CSF 15 - 45 mg/dL 81 (H)  Tube #  1  !: Data is abnormal (H): Data is abnormally high  Assessment:  76 yo WF with hx of afib, on xarelto, hx of CVA with residual right sided weakness, hx of cluster HA and migraines, CKD stage 3a, COPD, pulmonary fibrosis, seizure disorder, presented to ER with bitemporal headache today.  Initial concern was for possible GCA given her elevated inflammatory markers, and she was treated with 3 days of pulse dose steroids (9/2-9/4) without improvement in her headache.  On observation her headache has a severe significant positional quality highly concerning for CSF leak headache, but no etiology has been identified on MRI T or L-spine. MRI Brain not c/w CSF leak headache findings, but this is a poorly sensitive exam so proceeded to CT Myelogram which identified a likely leak in the lumbar spine. Greatly appreciate blood patch performed by Dr. Alfredo Batty on 9/6; CSF sampled at the time c/w low pressure (CSF had to be obtained with gentle aspiration and c/w traumatic tap with culture negative to date).    EEG confirms she is not having intermittent and undetected seizures  Unfortunately her headache is persistent and remains quite positional.  Unfortunately as caffeine as an adjunct has been ineffective and our supply of this medication is critically low so I will discontinue it.  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; unresolved -Appreciate primary team's assistance in reaching out to [place], [facility] and [facility] for anyone with beds available given the patient's refractory symptoms and bedbound status secondary to significant pain from this condition -Heparin drip for atrial fibrillation for when repeat procedure may be attempted; pharmacy consult placed -appreciate IVF per primary team  #Fluctuating left-sided weakness -Spot EEG and long-term EEG monitoring negative for seizure activity -Discontinue long-term EEG  Brooke Dare MD-PhD [facility] [phone] Available 7 AM to 7 PM, outside these hours please contact Neurologist on call listed  on AMION

## 2021-12-30 NOTE — Procedures (Addendum)
Patient Name: Jenny Kelly  MRN: [id number]  Epilepsy Attending: Charlsie Quest  Referring Physician/Provider: Gordy Councilman, MD  Duration: 12/29/2021 1322 to 12/30/2021 1322   Patient history: 76 yo WF with hx of afib, on xarelto, hx of CVA with residual right sided weakness, hx of cluster HA and migraines, CKD stage 3a, COPD, pulmonary fibrosis, seizure disorder, presented to ER with bitemporal headache and has intermittent right sided weakness. EEG to evaluate for seizure   Level of alertness: lethargic   AEDs during EEG study: TPM, PGB   Technical aspects: This EEG study was done with scalp electrodes positioned according to the 10-20 International system of electrode placement. Electrical activity was reviewed with band pass filter of 1-70Hz , sensitivity of 7 uV/mm, display speed of 58mm/sec with a 60Hz  notched filter applied as appropriate. EEG data were recorded continuously and digitally stored.  Video monitoring was available and reviewed as appropriate.   Description: No posterior dominant rhythm was seen. EEG showed continuous generalized 3 to 6 Hz theta-delta slowing, at times with triphasic morphology.  Hyperventilation and photic stimulation were not performed.      ABNORMALITY - Continuous slow, generalized   IMPRESSION: This study is suggestive of moderate diffuse encephalopathy, nonspecific etiology. No seizures or epileptiform discharges were seen were seen throughout the recording.   Malaki Koury 

## 2021-12-30 NOTE — Progress Notes (Signed)
ANTICOAGULATION CONSULT NOTE - follow-up  Pharmacy Consult for Heparin  Indication: atrial fibrillation  Allergies  Allergen Reactions   Ciprofloxacin Hives    Ask patient   Prochlorperazine Edisylate Other (See Comments)    Cant swallow Muscle cramping   Morphine Other (See Comments)    Confusion   Prednisone Other (See Comments)    unknown   Reduced Iso-Alpha Acids Complex Other (See Comments)    unknown   Statins Other (See Comments)    Muscle weakness    Sulfa Antibiotics Swelling   Azithromycin Rash   Latex Rash    Patient Measurements: Height: 5\' 2"  (157.5 cm) Weight: 60.1 kg (132 lb 7.9 oz) IBW/kg (Calculated) : 50.1 Heparin Dosing Weight: 60.1 kg  Vital Signs: Temp: 98.3 F (36.8 C) (09/08 2000) Temp Source: Oral (09/08 2000) BP: 127/51 (09/08 2000) Pulse Rate: 93 (09/08 2000)  Labs: Recent Labs    12/27/21 0824 12/29/21 1245 12/29/21 2318  HGB 12.6  --   --   HCT 39.0  --   --   PLT 223  --   --   APTT  --  42*  --   HEPARINUNFRC  --  <0.10* 0.16*  CREATININE 1.14*  --   --      Estimated Creatinine Clearance: 33.2 mL/min (A) (by C-G formula based on SCr of 1.14 mg/dL (H)).   Medical History: Past Medical History:  Diagnosis Date   Anemia    Anxiety    Arthritis    Asthma    Atrial fibrillation (HCC)    Blood transfusion without reported diagnosis    Cataract    CHF (congestive heart failure) (HCC)    Chronic kidney disease    Clotting disorder (HCC)    COPD (chronic obstructive pulmonary disease) (HCC)    Dementia (HCC)    Depression    Diabetes mellitus without complication (HCC)    Dyspnea    GERD (gastroesophageal reflux disease)    Headache    Heart murmur    History of kidney stones    Hyperlipidemia    Hypothyroidism    Myocardial infarction (HCC)    Neuromuscular disorder (HCC)    tremors   Pneumonia due to COVID-19 virus    Seizures (HCC)    Sleep apnea    Stroke (HCC)    Thyroid disease    Wide-complex  tachycardia     Medications:  Medications Prior to Admission  Medication Sig Dispense Refill Last Dose   acetaminophen (TYLENOL) 500 MG tablet Take 500 mg by mouth every 6 (six) hours as needed for mild pain or moderate pain.   12/22/2021   aspirin 81 MG EC tablet Take 1 tablet (81 mg total) by mouth daily. Swallow whole. 30 tablet 0 12/22/2021   budesonide (PULMICORT) 0.5 MG/2ML nebulizer solution Take 2 mLs (0.5 mg total) by nebulization in the morning and at bedtime. 120 mL 0 12/22/2021   cetirizine (ZYRTEC) 10 MG tablet Take 10 mg by mouth daily.   12/22/2021   docusate sodium (COLACE) 100 MG capsule Take 100 mg by mouth daily.   12/22/2021   donepezil (ARICEPT) 10 MG tablet Take 1 tablet (10 mg total) by mouth at bedtime. 30 tablet 0 12/21/2021   guaiFENesin (MUCINEX) 600 MG 12 hr tablet Take 600 mg by mouth 2 (two) times daily as needed for cough or to loosen phlegm.   12/22/2021   guaiFENesin-dextromethorphan (ROBITUSSIN DM) 100-10 MG/5ML syrup Take 10 mLs by mouth every 4 (four) hours  as needed for cough. 118 mL 0 unk   levothyroxine (SYNTHROID) 88 MCG tablet Take 88 mcg by mouth daily before breakfast.   12/22/2021   montelukast (SINGULAIR) 10 MG tablet Take 1 tablet (10 mg total) by mouth at bedtime. 30 tablet 0 12/21/2021   Multiple Vitamin (MULTIVITAMIN WITH MINERALS) TABS tablet Take 1 tablet by mouth daily.   12/22/2021   Omega-3 Fatty Acids (FISH OIL) 1200 MG CAPS Take 1,200 mg by mouth 2 (two) times daily.   12/22/2021   omeprazole (PRILOSEC) 40 MG capsule Take 1 capsule (40 mg total) by mouth daily. (Patient taking differently: Take 40 mg by mouth 2 (two) times daily.) 30 capsule 0 12/22/2021   PERCOCET 5-325 MG tablet Take 1 tablet by mouth every 4 (four) hours as needed for severe pain.   12/22/2021   pregabalin (LYRICA) 50 MG capsule TAKE 1 CAPSULE BY MOUTH 2 TIMES DAILY. (Patient taking differently: Take 50 mg by mouth 2 (two) times daily.) 60 capsule 0 12/22/2021   Probiotic Product (PROBIOTIC PO)  Take 15 Billion Cells by mouth daily.   12/22/2021   Rivaroxaban (XARELTO) 15 MG TABS tablet Take 1 tablet (15 mg total) by mouth daily. 90 tablet 3 12/22/2021 at 1200   topiramate (TOPAMAX) 200 MG tablet Take 1 tablet (200 mg total) by mouth 2 (two) times daily. 60 tablet 0 12/22/2021   URINARY HEALTH/CRANBERRY PO Take 1 tablet by mouth 2 (two) times daily.   12/22/2021   pravastatin (PRAVACHOL) 80 MG tablet Take 1 tablet (80 mg total) by mouth every evening. (Patient not taking: Reported on 11/01/2021) 90 tablet 0     Assessment:  76 y.o female with history of atrial fibrillation on Xarelto PTA,  hx of CVA with residual right sided weakness, hx of cluster HA and migraines, CKD stage 3a, COPD, pulmonary fibrosis, seizure disorder, presented to ER on 12/22/21 with bitemporal headache.  Per Neurologist, headache is suspected low pressure CSF headache secondary to spontaneous leak. Neuro has  consulted pharmacist to start IV Heparin drip for atrial fibrillation in case repeat procedure is needed.  Last dose of Xarelto taken this admit on 9/3 @ 0830.   CBC wnl/stable.  12/30/2021 early AM HL: 0.16- subtherapeutic  Goal of Therapy:  Heparin level 0.3-0.5 units/ml Monitor platelets by anticoagulation protocol: Yes   Plan:  Increase heparin drip to 950 units/hr Check 8 hr HL (12/30/2021 0900), CBC Daily HL, CBC Monitor for s/sx of bleeding  Thank you for allowing pharmacy to be part of this patients care team.   Greta Doom BS, PharmD, BCPS Clinical Pharmacist 12/30/2021 12:26 AM  Contact: [phone] after 3 PM  "Be curious, not judgmental..." -Debbora Dus

## 2021-12-30 NOTE — Progress Notes (Signed)
[facility] PROGRESS NOTE    Progress Note  Jenny Kelly  IQT:[id number] DOB: Jan 24, 1946 DOA: 12/22/2021 PCP: Crist Fat, MD     Brief Narrative:   Jenny Kelly is an 76 y.o. female past medical history of atrial fibrillation on Xarelto, history of CVA with right residual weakness, migraines chronic kidney disease stage IIIa, pulmonary fibrosis seizure disorder came into the ED for bitemporal headaches associated with photophobia.  Patient is on Topamax and Lyrica for history of seizures and headache. In the ED, ESR and CRP were elevated neurology was consulted.  MRI of the brain and CT of the head were negative.  Due to elevated markers and bitemporal headache temporal arteritis was considered ultrasound did not show halo sign vascular surgery was consulted for possible biopsy Xarelto is on hold, she was started on steroids but she did not improve with these were stopped.  Neurology recommended CT myelogram showed CSF contrast extending into the right foraminal L4-L5.  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 Neurology consulted IR status post blood patch on 12/27/2021. Further management per neurology. Continue oxycodone caffeine tablets. Continue heparin drip in case any further procedures needed. Started on MiraLAX as she has not had a bowel movement in several days. Bedrest per neurology.  Acute bronchitis/pulmonary fibrosis: She was started on Doxy which she will complete her treatment in house.  History of CVA with residual deficits: Noted.  Paroxysmal atrial fibrillation: Xarelto on hold, now on IV heparin  Chronic disease stage IIIa: Creatinine at baseline.  Chronic diastolic heart failure: Appears euvolemic and stable.  COPD: Stable.   DVT prophylaxis: SCD Family Communication:husband Status is: Inpatient Remains inpatient appropriate  because: headache    Code Status:     Code Status Orders  (From admission, onward)           Start     Ordered   12/23/21 0811  Do not attempt resuscitation (DNR)  Continuous       Question Answer Comment  In the event of cardiac or respiratory ARREST Do not call a "code blue"   In the event of cardiac or respiratory ARREST Do not perform Intubation, CPR, defibrillation or ACLS   In the event of cardiac or respiratory ARREST Use medication by any route, position, wound care, and other measures to relive pain and suffering. May use oxygen, suction and manual treatment of airway obstruction as needed for comfort.      12/23/21 0810           Code Status History     Date Active Date Inactive Code Status Order ID Comments User Context   11/01/2021 0815 11/04/2021 1854 DNR [id number]  Clydie Braun, MD ED   07/01/2021 0927 07/07/2021 1851 DNR [id number]  Jonah Blue, MD ED   05/07/2021 1433 05/16/2021 1732 Partial Code [id number]  Erick Colace, MD Inpatient   05/07/2021 1431 05/07/2021 1433 Full Code [id number]  Erick Colace, MD Inpatient   05/01/2021 1755 05/07/2021 1357 Partial Code [id number] Intubation short term only if needed Kathlene Cote, PA-C ED         IV Access:   Peripheral IV   Procedures and diagnostic studies:   No results found.   Medical Consultants:   None.   Subjective:    Jenny Kelly persistent headaches  Objective:    Vitals:  12/29/21 2000 12/30/21 0000 12/30/21 0400 12/30/21 0500  BP: (!) 127/51 (!) 128/55 130/60   Pulse: 93 70 68   Resp: $Remo'16 16 16   'tPHTd$ Temp: 98.3 F (36.8 C) 98.2 F (36.8 C) 97.9 F (36.6 C)   TempSrc: Oral Oral Oral   SpO2: 100% 100% 100%   Weight:    64.6 kg  Height:       SpO2: 100 %   Intake/Output Summary (Last 24 hours) at 12/30/2021 0805 Last data filed at 12/29/2021 1800 Gross per 24 hour  Intake 240 ml  Output 750 ml  Net -510 ml    Filed Weights   12/26/21 0437 12/27/21 0430  12/30/21 0500  Weight: 60.1 kg 60.1 kg 64.6 kg    Exam: General exam: In no acute distress. Respiratory system: Good air movement and clear to auscultation. Cardiovascular system: S1 & S2 heard, RRR. No JVD. Gastrointestinal system: Abdomen is nondistended, soft and nontender.  Extremities: No pedal edema. Skin: No rashes, lesions or ulcers Psychiatry: Judgement and insight appear normal. Mood & affect appropriate. Data Reviewed:    Labs: Basic Metabolic Panel: Recent Labs  Lab 12/25/21 0341 12/27/21 0824  NA 141 143  K 3.9 3.5  CL 110 114*  CO2 25 21*  GLUCOSE 121* 94  BUN 32* 26*  CREATININE 1.29* 1.14*  CALCIUM 8.9 8.7*    GFR Estimated Creatinine Clearance: 37 mL/min (A) (by C-G formula based on SCr of 1.14 mg/dL (H)). Liver Function Tests: Recent Labs  Lab 12/27/21 0824  AST 12*  ALT 11  ALKPHOS 81  BILITOT 0.6  PROT 6.0*  ALBUMIN 2.1*    No results for input(s): "LIPASE", "AMYLASE" in the last 168 hours. No results for input(s): "AMMONIA" in the last 168 hours. Coagulation profile No results for input(s): "INR", "PROTIME" in the last 168 hours. COVID-19 Labs  Recent Labs    12/27/21 0824  CRP 22.7*     Lab Results  Component Value Date   SARSCOV2NAA NEGATIVE 12/22/2021   SARSCOV2NAA NEGATIVE 11/01/2021   [place] NEGATIVE 07/01/2021   [place] NEGATIVE 05/01/2021    CBC: Recent Labs  Lab 12/25/21 0341 12/27/21 0824  WBC 13.3* 21.4*  NEUTROABS  --  14.9*  HGB 11.5* 12.6  HCT 36.2 39.0  MCV 87.9 85.9  PLT 214 223    Cardiac Enzymes: No results for input(s): "CKTOTAL", "CKMB", "CKMBINDEX", "TROPONINI" in the last 168 hours. BNP (last 3 results) No results for input(s): "PROBNP" in the last 8760 hours. CBG: Recent Labs  Lab 12/29/21 0626 12/29/21 1120 12/29/21 1611 12/29/21 2143 12/30/21 0627  GLUCAP 126* 117* 104* 115* 131*    D-Dimer: No results for input(s): "DDIMER" in the last 72 hours. Hgb A1c: No results  for input(s): "HGBA1C" in the last 72 hours. Lipid Profile: No results for input(s): "CHOL", "HDL", "LDLCALC", "TRIG", "CHOLHDL", "LDLDIRECT" in the last 72 hours. Thyroid function studies: No results for input(s): "TSH", "T4TOTAL", "T3FREE", "THYROIDAB" in the last 72 hours.  Invalid input(s): "FREET3" Anemia work up: No results for input(s): "VITAMINB12", "FOLATE", "FERRITIN", "TIBC", "IRON", "RETICCTPCT" in the last 72 hours. Sepsis Labs: Recent Labs  Lab 12/25/21 0341 12/27/21 0824  WBC 13.3* 21.4*    Microbiology Recent Results (from the past 240 hour(s))  Resp Panel by RT-PCR (Flu A&B, Covid) Anterior Nasal Swab     Status: None   Collection Time: 12/22/21  9:55 PM   Specimen: Anterior Nasal Swab  Result Value Ref Range Status   SARS Coronavirus  2 by RT PCR NEGATIVE NEGATIVE Final    Comment: (NOTE) SARS-CoV-2 target nucleic acids are NOT DETECTED.  The SARS-CoV-2 RNA is generally detectable in upper respiratory specimens during the acute phase of infection. The lowest concentration of SARS-CoV-2 viral copies this assay can detect is 138 copies/mL. A negative result does not preclude SARS-Cov-2 infection and should not be used as the sole basis for treatment or other patient management decisions. A negative result may occur with  improper specimen collection/handling, submission of specimen other than nasopharyngeal swab, presence of viral mutation(s) within the areas targeted by this assay, and inadequate number of viral copies(<138 copies/mL). A negative result must be combined with clinical observations, patient history, and epidemiological information. The expected result is Negative.  Fact Sheet for Patients:  EntrepreneurPulse.com.au  Fact Sheet for Healthcare Providers:  IncredibleEmployment.be  This test is no t yet approved or cleared by the [place] FDA and  has been authorized for detection and/or diagnosis of  SARS-CoV-2 by FDA under an Emergency Use Authorization (EUA). This EUA will remain  in effect (meaning this test can be used) for the duration of the COVID-19 declaration under Section 564(b)(1) of the Act, 21 U.S.C.section 360bbb-3(b)(1), unless the authorization is terminated  or revoked sooner.       Influenza A by PCR NEGATIVE NEGATIVE Final   Influenza B by PCR NEGATIVE NEGATIVE Final    Comment: (NOTE) The Xpert Xpress SARS-CoV-2/FLU/RSV plus assay is intended as an aid in the diagnosis of influenza from Nasopharyngeal swab specimens and should not be used as a sole basis for treatment. Nasal washings and aspirates are unacceptable for Xpert Xpress SARS-CoV-2/FLU/RSV testing.  Fact Sheet for Patients: EntrepreneurPulse.com.au  Fact Sheet for Healthcare Providers: IncredibleEmployment.be  This test is not yet approved or cleared by the [place] FDA and has been authorized for detection and/or diagnosis of SARS-CoV-2 by FDA under an Emergency Use Authorization (EUA). This EUA will remain in effect (meaning this test can be used) for the duration of the COVID-19 declaration under Section 564(b)(1) of the Act, 21 U.S.C. section 360bbb-3(b)(1), unless the authorization is terminated or revoked.  Performed at [facility], [place] [address], [place], [place] [address]   Surgical pcr screen     Status: Abnormal   Collection Time: 12/26/21 11:59 PM   Specimen: Nasal Mucosa; Nasal Swab  Result Value Ref Range Status   MRSA, PCR NEGATIVE NEGATIVE Final   Staphylococcus aureus POSITIVE (A) NEGATIVE Final    Comment: (NOTE) The Xpert SA Assay (FDA approved for NASAL specimens in patients 32 years of age and older), is one component of a comprehensive surveillance program. It is not intended to diagnose infection nor to guide or monitor treatment. Performed at [facility], [place] [address], [place], [place] [address]   CSF  culture w Gram Stain     Status: None (Preliminary result)   Collection Time: 12/27/21  1:20 PM   Specimen: CSF; Cerebrospinal Fluid  Result Value Ref Range Status   Specimen Description CSF  Final   Special Requests NONE  Final   Gram Stain   Final    WBC PRESENT,BOTH PMN AND MONONUCLEAR NO ORGANISMS SEEN CYTOSPIN SMEAR    Culture   Final    NO GROWTH 2 DAYS Performed at [facility], [place] [address], [place], [place] [address]    Report Status PENDING  Incomplete     Medications:    aspirin EC  81 mg  Oral Daily   caffeine  200 mg Oral Q4H   Chlorhexidine Gluconate Cloth  6 each Topical Q0600   docusate sodium  100 mg Oral BID   donepezil  10 mg Oral QHS   doxycycline  100 mg Oral Q12H   guaiFENesin  600 mg Oral BID    HYDROmorphone (DILAUDID) injection  1 mg Intravenous Once   insulin aspart  0-15 Units Subcutaneous TID WC   levothyroxine  88 mcg Oral QAC breakfast   montelukast  10 mg Oral QHS   mupirocin ointment  1 Application Nasal BID   pantoprazole  40 mg Oral BID   polyethylene glycol  17 g Oral BID   pregabalin  50 mg Oral BID   topiramate  200 mg Oral BID   Continuous Infusions:  heparin 950 Units/hr (12/30/21 0049)       LOS: 6 days   Charlynne Cousins  [facility]  12/30/2021, 8:05 AM

## 2021-12-31 DIAGNOSIS — G9600 Cerebrospinal fluid leak, unspecified: Secondary | ICD-10-CM | POA: Diagnosis not present

## 2021-12-31 DIAGNOSIS — J9611 Chronic respiratory failure with hypoxia: Secondary | ICD-10-CM | POA: Diagnosis not present

## 2021-12-31 DIAGNOSIS — J449 Chronic obstructive pulmonary disease, unspecified: Secondary | ICD-10-CM | POA: Diagnosis not present

## 2021-12-31 DIAGNOSIS — I5032 Chronic diastolic (congestive) heart failure: Secondary | ICD-10-CM | POA: Diagnosis not present

## 2021-12-31 LAB — CBC
HCT: 39 % (ref 36.0–46.0)
Hemoglobin: 12.9 g/dL (ref 12.0–15.0)
MCH: 27.9 pg (ref 26.0–34.0)
MCHC: 33.1 g/dL (ref 30.0–36.0)
MCV: 84.2 fL (ref 80.0–100.0)
Platelets: 377 10*3/uL (ref 150–400)
RBC: 4.63 MIL/uL (ref 3.87–5.11)
RDW: 16.9 % — ABNORMAL HIGH (ref 11.5–15.5)
WBC: 15.6 10*3/uL — ABNORMAL HIGH (ref 4.0–10.5)
nRBC: 0 % (ref 0.0–0.2)

## 2021-12-31 LAB — GLUCOSE, CAPILLARY
Glucose-Capillary: 112 mg/dL — ABNORMAL HIGH (ref 70–99)
Glucose-Capillary: 111 mg/dL — ABNORMAL HIGH (ref 70–99)
Glucose-Capillary: 127 mg/dL — ABNORMAL HIGH (ref 70–99)
Glucose-Capillary: 101 mg/dL — ABNORMAL HIGH (ref 70–99)

## 2021-12-31 LAB — HEPARIN LEVEL (UNFRACTIONATED): Heparin Unfractionated: 0.33 IU/mL (ref 0.30–0.70)

## 2021-12-31 MED ORDER — METOCLOPRAMIDE HCL 5 MG/ML IJ SOLN
5.0000 mg | Freq: Four times a day (QID) | INTRAMUSCULAR | Status: DC
  Administered 2021-12-31 – 2022-01-02 (×7): 5 mg via INTRAVENOUS
  Filled 2021-12-31 (×7): qty 2

## 2021-12-31 MED ORDER — DIPHENHYDRAMINE HCL 50 MG/ML IJ SOLN
12.5000 mg | Freq: Four times a day (QID) | INTRAMUSCULAR | Status: DC
  Administered 2021-12-31 – 2022-01-02 (×8): 12.5 mg via INTRAVENOUS
  Filled 2021-12-31 (×8): qty 1

## 2021-12-31 MED ORDER — ONDANSETRON HCL 4 MG/2ML IJ SOLN
4.0000 mg | Freq: Four times a day (QID) | INTRAMUSCULAR | Status: DC | PRN
  Administered 2021-12-31 – 2022-01-02 (×5): 4 mg via INTRAVENOUS
  Filled 2021-12-31 (×5): qty 2

## 2021-12-31 MED ORDER — MAGNESIUM SULFATE 2 GM/50ML IV SOLN
2.0000 g | Freq: Once | INTRAVENOUS | Status: AC
  Administered 2021-12-31: 2 g via INTRAVENOUS
  Filled 2021-12-31: qty 50

## 2021-12-31 MED ORDER — SODIUM CHLORIDE 0.9 % IV SOLN: INTRAVENOUS | Status: AC

## 2021-12-31 MED ORDER — ACETAMINOPHEN 10 MG/ML IV SOLN
1000.0000 mg | Freq: Four times a day (QID) | INTRAVENOUS | Status: AC
  Administered 2021-12-31 – 2022-01-01 (×4): 1000 mg via INTRAVENOUS
  Filled 2021-12-31 (×4): qty 100

## 2021-12-31 NOTE — Progress Notes (Signed)
[facility] PROGRESS NOTE    Progress Note  [name]  JME:[id number] DOB: 1945-12-21 DOA: 12/22/2021 PCP: Townsend Roger, MD     Brief Narrative:   Jenny Kelly is an 76 y.o. female past medical history of atrial fibrillation on Xarelto, history of CVA with right residual weakness, migraines chronic kidney disease stage IIIa, pulmonary fibrosis seizure disorder came into the ED for bitemporal headaches associated with photophobia.  Patient is on Topamax and Lyrica for history of seizures and headache. In the ED, ESR and CRP were elevated neurology was consulted.  MRI of the brain and CT of the head were negative.  Due to elevated markers and bitemporal headache temporal arteritis was considered ultrasound did not show halo sign vascular surgery was consulted for possible biopsy Xarelto is on hold, she was started on steroids but she did not improve with these were stopped.  Neurology recommended CT myelogram showed CSF contrast extending into the right foraminal L4-L5.  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 Neurology consulted IR status post blood patch on 12/27/2021. Further management per neurology. Continue oxycodone. Continue heparin drip in case any further procedures needed. Started on MiraLAX as she has not had a bowel movement in several days. Further management per neurology.  Acute bronchitis/pulmonary fibrosis: She was started on Doxy which she will complete her treatment in house.  History of CVA with residual deficits: Noted.  Paroxysmal atrial fibrillation: Xarelto on hold, now on IV heparin  Chronic disease stage IIIa: Creatinine at baseline.  Chronic diastolic heart failure: Appears euvolemic and stable.  COPD: Stable.   DVT prophylaxis: SCD Family Communication:husband Status is: Inpatient Remains inpatient appropriate  because: headache    Code Status:     Code Status Orders  (From admission, onward)           Start     Ordered   12/23/21 0811  Do not attempt resuscitation (DNR)  Continuous       Question Answer Comment  In the event of cardiac or respiratory ARREST Do not call a "code blue"   In the event of cardiac or respiratory ARREST Do not perform Intubation, CPR, defibrillation or ACLS   In the event of cardiac or respiratory ARREST Use medication by any route, position, wound care, and other measures to relive pain and suffering. May use oxygen, suction and manual treatment of airway obstruction as needed for comfort.      12/23/21 0810           Code Status History     Date Active Date Inactive Code Status Order ID Comments User Context   11/01/2021 0815 11/04/2021 1854 DNR [id number]  Norval Morton, MD ED   07/01/2021 0927 07/07/2021 1851 DNR [id number]  Karmen Bongo, MD ED   05/07/2021 1433 05/16/2021 1732 Partial Code [id number]  Charlett Blake, MD Inpatient   05/07/2021 1431 05/07/2021 1433 Full Code [id number]  Charlett Blake, MD Inpatient   05/01/2021 1755 05/07/2021 1357 Partial Code [id number] Intubation short term only if needed Germain Osgood, PA-C ED         IV Access:   Peripheral IV   Procedures and diagnostic studies:   Overnight EEG with video  Result Date: 12/30/2021 Lora Havens, MD     12/30/2021  3:42 PM Patient Name: Jenny Kelly MRN: [id number] Epilepsy Attending: Marcelle Overlie  Annabelle Harman Referring Physician/Provider: Gordy Councilman, MD Duration: 12/29/2021 1322 to 12/30/2021 1322  Patient history: 76 yo WF with hx of afib, on xarelto, hx of CVA with residual right sided weakness, hx of cluster HA and migraines, CKD stage 3a, COPD, pulmonary fibrosis, seizure disorder, presented to ER with bitemporal headache and has intermittent right sided weakness. EEG to evaluate for seizure  Level of alertness: lethargic  AEDs during EEG study: TPM, PGB  Technical  aspects: This EEG study was done with scalp electrodes positioned according to the 10-20 International system of electrode placement. Electrical activity was reviewed with band pass filter of 1-70Hz , sensitivity of 7 uV/mm, display speed of 7mm/sec with a 60Hz  notched filter applied as appropriate. EEG data were recorded continuously and digitally stored.  Video monitoring was available and reviewed as appropriate.  Description: No posterior dominant rhythm was seen. EEG showed continuous generalized 3 to 6 Hz theta-delta slowing, at times with triphasic morphology.  Hyperventilation and photic stimulation were not performed.    ABNORMALITY - Continuous slow, generalized  IMPRESSION: This study is suggestive of moderate diffuse encephalopathy, nonspecific etiology. No seizures or epileptiform discharges were seen were seen throughout the recording.     Medical Consultants:   None.   Subjective:    [name] sustained head and now nauseated.  Objective:    Vitals:   12/30/21 1957 12/31/21 0000 12/31/21 0400 12/31/21 0716  BP: 131/65 124/63 125/67 132/72  Pulse: (!) 104 73 73 87  Resp: 16 18 18 16   Temp: 98.2 F (36.8 C) 97.8 F (36.6 C) 97.6 F (36.4 C) 98.1 F (36.7 C)  TempSrc: Oral Oral Oral Oral  SpO2: 96% 97% 99% 98%  Weight:      Height:       SpO2: 98 %   Intake/Output Summary (Last 24 hours) at 12/31/2021 0818 Last data filed at 12/31/2021 0717 Gross per 24 hour  Intake --  Output 600 ml  Net -600 ml    Filed Weights   12/26/21 0437 12/27/21 0430 12/30/21 0500  Weight: 60.1 kg 60.1 kg 64.6 kg    Exam: General exam: In no acute distress. Respiratory system: Good air movement and clear to auscultation. Cardiovascular system: S1 & S2 heard, RRR. No JVD. Gastrointestinal system: Abdomen is nondistended, soft and nontender.  Extremities: No pedal edema. Skin: No rashes, lesions or ulcers Psychiatry: Judgement and insight appear normal.  Mood & affect appropriate. Data Reviewed:    Labs: Basic Metabolic Panel: Recent Labs  Lab 12/25/21 0341 12/27/21 0824  NA 141 143  K 3.9 3.5  CL 110 114*  CO2 25 21*  GLUCOSE 121* 94  BUN 32* 26*  CREATININE 1.29* 1.14*  CALCIUM 8.9 8.7*    GFR Estimated Creatinine Clearance: 37 mL/min (A) (by C-G formula based on SCr of 1.14 mg/dL (H)). Liver Function Tests: Recent Labs  Lab 12/27/21 0824  AST 12*  ALT 11  ALKPHOS 81  BILITOT 0.6  PROT 6.0*  ALBUMIN 2.1*    No results for input(s): "LIPASE", "AMYLASE" in the last 168 hours. No results for input(s): "AMMONIA" in the last 168 hours. Coagulation profile No results for input(s): "INR", "PROTIME" in the last 168 hours. COVID-19 Labs  No results for input(s): "DDIMER", "FERRITIN", "LDH", "CRP" in the last 72 hours.   Lab Results  Component Value Date   SARSCOV2NAA NEGATIVE 12/22/2021   SARSCOV2NAA NEGATIVE 11/01/2021   SARSCOV2NAA NEGATIVE 07/01/2021   SARSCOV2NAA NEGATIVE 05/01/2021  CBC: Recent Labs  Lab 12/25/21 0341 12/27/21 0824 12/31/21 0207  WBC 13.3* 21.4* 15.6*  NEUTROABS  --  14.9*  --   HGB 11.5* 12.6 12.9  HCT 36.2 39.0 39.0  MCV 87.9 85.9 84.2  PLT 214 223 377    Cardiac Enzymes: No results for input(s): "CKTOTAL", "CKMB", "CKMBINDEX", "TROPONINI" in the last 168 hours. BNP (last 3 results) No results for input(s): "PROBNP" in the last 8760 hours. CBG: Recent Labs  Lab 12/30/21 0627 12/30/21 1155 12/30/21 1613 12/30/21 2110 12/31/21 0608  GLUCAP 131* 113* 115* 118* 112*    D-Dimer: No results for input(s): "DDIMER" in the last 72 hours. Hgb A1c: No results for input(s): "HGBA1C" in the last 72 hours. Lipid Profile: No results for input(s): "CHOL", "HDL", "LDLCALC", "TRIG", "CHOLHDL", "LDLDIRECT" in the last 72 hours. Thyroid function studies: No results for input(s): "TSH", "T4TOTAL", "T3FREE", "THYROIDAB" in the last 72 hours.  Invalid input(s): "FREET3" Anemia work  up: No results for input(s): "VITAMINB12", "FOLATE", "FERRITIN", "TIBC", "IRON", "RETICCTPCT" in the last 72 hours. Sepsis Labs: Recent Labs  Lab 12/25/21 0341 12/27/21 0824 12/31/21 0207  WBC 13.3* 21.4* 15.6*    Microbiology Recent Results (from the past 240 hour(s))  Resp Panel by RT-PCR (Flu A&B, Covid) Anterior Nasal Swab     Status: None   Collection Time: 12/22/21  9:55 PM   Specimen: Anterior Nasal Swab  Result Value Ref Range Status   SARS Coronavirus 2 by RT PCR NEGATIVE NEGATIVE Final    Comment: (NOTE) SARS-CoV-2 target nucleic acids are NOT DETECTED.  The SARS-CoV-2 RNA is generally detectable in upper respiratory specimens during the acute phase of infection. The lowest concentration of SARS-CoV-2 viral copies this assay can detect is 138 copies/mL. A negative result does not preclude SARS-Cov-2 infection and should not be used as the sole basis for treatment or other patient management decisions. A negative result may occur with  improper specimen collection/handling, submission of specimen other than nasopharyngeal swab, presence of viral mutation(s) within the areas targeted by this assay, and inadequate number of viral copies(<138 copies/mL). A negative result must be combined with clinical observations, patient history, and epidemiological information. The expected result is Negative.  Fact Sheet for Patients:  EntrepreneurPulse.com.au  Fact Sheet for Healthcare Providers:  IncredibleEmployment.be  This test is no t yet approved or cleared by the [place] FDA and  has been authorized for detection and/or diagnosis of SARS-CoV-2 by FDA under an Emergency Use Authorization (EUA). This EUA will remain  in effect (meaning this test can be used) for the duration of the COVID-19 declaration under Section 564(b)(1) of the Act, 21 U.S.C.section 360bbb-3(b)(1), unless the authorization is terminated  or revoked sooner.        Influenza A by PCR NEGATIVE NEGATIVE Final   Influenza B by PCR NEGATIVE NEGATIVE Final    Comment: (NOTE) The Xpert Xpress SARS-CoV-2/FLU/RSV plus assay is intended as an aid in the diagnosis of influenza from Nasopharyngeal swab specimens and should not be used as a sole basis for treatment. Nasal washings and aspirates are unacceptable for Xpert Xpress SARS-CoV-2/FLU/RSV testing.  Fact Sheet for Patients: EntrepreneurPulse.com.au  Fact Sheet for Healthcare Providers: IncredibleEmployment.be  This test is not yet approved or cleared by the [place] FDA and has been authorized for detection and/or diagnosis of SARS-CoV-2 by FDA under an Emergency Use Authorization (EUA). This EUA will remain in effect (meaning this test can be used) for the duration of the COVID-19 declaration under  Section 564(b)(1) of the Act, 21 U.S.C. section 360bbb-3(b)(1), unless the authorization is terminated or revoked.  Performed at [facility], [place] [address] [place], [place] [address]   Surgical pcr screen     Status: Abnormal   Collection Time: 12/26/21 11:59 PM   Specimen: Nasal Mucosa; Nasal Swab  Result Value Ref Range Status   MRSA, PCR NEGATIVE NEGATIVE Final   Staphylococcus aureus POSITIVE (A) NEGATIVE Final    Comment: (NOTE) The Xpert SA Assay (FDA approved for NASAL specimens in patients 48 years of age and older), is one component of a comprehensive surveillance program. It is not intended to diagnose infection nor to guide or monitor treatment. Performed at [facility], [place] [address] [place], [place] [address]   CSF culture w Gram Stain     Status: None   Collection Time: 12/27/21  1:20 PM   Specimen: CSF; Cerebrospinal Fluid  Result Value Ref Range Status   Specimen Description CSF  Final   Special Requests NONE  Final   Gram Stain   Final    WBC PRESENT,BOTH PMN AND MONONUCLEAR NO ORGANISMS SEEN CYTOSPIN  SMEAR    Culture   Final    NO GROWTH 3 DAYS Performed at [facility], [place] [address], [place], [place] [address]    Report Status 12/30/2021 FINAL  Final     Medications:    aspirin EC  81 mg Oral Daily   donepezil  10 mg Oral QHS   guaiFENesin  600 mg Oral BID    HYDROmorphone (DILAUDID) injection  1 mg Intravenous Once   insulin aspart  0-15 Units Subcutaneous TID WC   levothyroxine  88 mcg Oral QAC breakfast   montelukast  10 mg Oral QHS   mupirocin ointment  1 Application Nasal BID   pantoprazole  40 mg Oral BID   polyethylene glycol  17 g Oral BID   polyethylene glycol  17 g Oral Daily   pregabalin  50 mg Oral BID   topiramate  200 mg Oral BID   Continuous Infusions:  heparin 950 Units/hr (12/30/21 1613)       LOS: 7 days   Charlynne Cousins  [facility]  12/31/2021, 8:18 AM

## 2021-12-31 NOTE — Progress Notes (Signed)
ANTICOAGULATION CONSULT NOTE - follow-up  Pharmacy Consult for Heparin  Indication: atrial fibrillation  Allergies  Allergen Reactions   Ciprofloxacin Hives    Ask patient   Prochlorperazine Edisylate Other (See Comments)    Cant swallow Muscle cramping   Morphine Other (See Comments)    Confusion   Prednisone Other (See Comments)    unknown   Reduced Iso-Alpha Acids Complex Other (See Comments)    unknown   Statins Other (See Comments)    Muscle weakness    Sulfa Antibiotics Swelling   Azithromycin Rash   Latex Rash    Patient Measurements: Height: 5\' 2"  (157.5 cm) Weight: 64.6 kg (142 lb 6.7 oz) IBW/kg (Calculated) : 50.1 Heparin Dosing Weight: 60.1 kg  Vital Signs: Temp: 97.6 F (36.4 C) (09/10 0400) Temp Source: Oral (09/10 0400) BP: 125/67 (09/10 0400) Pulse Rate: 73 (09/10 0400)  Labs: Recent Labs    12/29/21 1245 12/29/21 2318 12/30/21 1213 12/30/21 2110 12/31/21 0207  HGB  --   --   --   --  12.9  HCT  --   --   --   --  39.0  PLT  --   --   --   --  377  APTT 42*  --   --   --   --   HEPARINUNFRC <0.10*   < > 0.30 0.32 0.33   < > = values in this interval not displayed.     Estimated Creatinine Clearance: 37 mL/min (A) (by C-G formula based on SCr of 1.14 mg/dL (H)).   Medical History: Past Medical History:  Diagnosis Date   Anemia    Anxiety    Arthritis    Asthma    Atrial fibrillation (HCC)    Blood transfusion without reported diagnosis    Cataract    CHF (congestive heart failure) (HCC)    Chronic kidney disease    Clotting disorder (HCC)    COPD (chronic obstructive pulmonary disease) (HCC)    Dementia (HCC)    Depression    Diabetes mellitus without complication (HCC)    Dyspnea    GERD (gastroesophageal reflux disease)    Headache    Heart murmur    History of kidney stones    Hyperlipidemia    Hypothyroidism    Myocardial infarction (HCC)    Neuromuscular disorder (HCC)    tremors   Pneumonia due to COVID-19 virus     Seizures (HCC)    Sleep apnea    Stroke (HCC)    Thyroid disease    Wide-complex tachycardia     Medications:  Medications Prior to Admission  Medication Sig Dispense Refill Last Dose   acetaminophen (TYLENOL) 500 MG tablet Take 500 mg by mouth every 6 (six) hours as needed for mild pain or moderate pain.   12/22/2021   aspirin 81 MG EC tablet Take 1 tablet (81 mg total) by mouth daily. Swallow whole. 30 tablet 0 12/22/2021   budesonide (PULMICORT) 0.5 MG/2ML nebulizer solution Take 2 mLs (0.5 mg total) by nebulization in the morning and at bedtime. 120 mL 0 12/22/2021   cetirizine (ZYRTEC) 10 MG tablet Take 10 mg by mouth daily.   12/22/2021   docusate sodium (COLACE) 100 MG capsule Take 100 mg by mouth daily.   12/22/2021   donepezil (ARICEPT) 10 MG tablet Take 1 tablet (10 mg total) by mouth at bedtime. 30 tablet 0 12/21/2021   guaiFENesin (MUCINEX) 600 MG 12 hr tablet Take 600 mg by  mouth 2 (two) times daily as needed for cough or to loosen phlegm.   12/22/2021   guaiFENesin-dextromethorphan (ROBITUSSIN DM) 100-10 MG/5ML syrup Take 10 mLs by mouth every 4 (four) hours as needed for cough. 118 mL 0 unk   levothyroxine (SYNTHROID) 88 MCG tablet Take 88 mcg by mouth daily before breakfast.   12/22/2021   montelukast (SINGULAIR) 10 MG tablet Take 1 tablet (10 mg total) by mouth at bedtime. 30 tablet 0 12/21/2021   Multiple Vitamin (MULTIVITAMIN WITH MINERALS) TABS tablet Take 1 tablet by mouth daily.   12/22/2021   Omega-3 Fatty Acids (FISH OIL) 1200 MG CAPS Take 1,200 mg by mouth 2 (two) times daily.   12/22/2021   omeprazole (PRILOSEC) 40 MG capsule Take 1 capsule (40 mg total) by mouth daily. (Patient taking differently: Take 40 mg by mouth 2 (two) times daily.) 30 capsule 0 12/22/2021   PERCOCET 5-325 MG tablet Take 1 tablet by mouth every 4 (four) hours as needed for severe pain.   12/22/2021   pregabalin (LYRICA) 50 MG capsule TAKE 1 CAPSULE BY MOUTH 2 TIMES DAILY. (Patient taking differently: Take 50 mg  by mouth 2 (two) times daily.) 60 capsule 0 12/22/2021   Probiotic Product (PROBIOTIC PO) Take 15 Billion Cells by mouth daily.   12/22/2021   Rivaroxaban (XARELTO) 15 MG TABS tablet Take 1 tablet (15 mg total) by mouth daily. 90 tablet 3 12/22/2021 at 1200   topiramate (TOPAMAX) 200 MG tablet Take 1 tablet (200 mg total) by mouth 2 (two) times daily. 60 tablet 0 12/22/2021   URINARY HEALTH/CRANBERRY PO Take 1 tablet by mouth 2 (two) times daily.   12/22/2021   pravastatin (PRAVACHOL) 80 MG tablet Take 1 tablet (80 mg total) by mouth every evening. (Patient not taking: Reported on 11/01/2021) 90 tablet 0     Assessment:  76 y.o female with history of atrial fibrillation on Xarelto PTA,  hx of CVA with residual right sided weakness, hx of cluster HA and migraines, CKD stage 3a, COPD, pulmonary fibrosis, seizure disorder, presented to ER on 12/22/21 with bitemporal headache.  Per Neurologist, headache is suspected low pressure CSF headache secondary to spontaneous leak. Neuro has  consulted pharmacist to start IV Heparin drip for atrial fibrillation in case repeat procedure is needed.  Last dose of Xarelto taken this admit on 9/3 @ 0830.     Repeat heparin level remains therapeutic.  Goal of Therapy:  Heparin level 0.3-0.5 units/ml Monitor platelets by anticoagulation protocol: Yes   Plan:  Continue heparin drip at 950 units/hr Daily HL, CBC Monitor for s/sx of bleeding  Fredonia Highland, PharmD, BCPS, [facility] Clinical Pharmacist Please check AMION for all [facility] Pharmacy numbers 12/31/2021

## 2021-12-31 NOTE — Progress Notes (Signed)
Neurology Progress Note   S:// Patient has head of bed at about 30 degrees secondary to nausea which came on yesterday evening. Given Zofran with little improvement. Poor appetite and has only had a small sip of water since yesterday. Continues to have severe headache which seems to be about same severity. Again complains of worsening headache with eye movement today.  O:// Current vital signs: BP 132/72 (BP Location: Left Arm)   Pulse 87   Temp 98.1 F (36.7 C) (Oral)   Resp 16   Ht 5\' 2"  (1.575 m)   Wt 64.6 kg   SpO2 98%   BMI 26.05 kg/m  Vital signs in last 24 hours: Temp:  [97.6 F (36.4 C)-98.3 F (36.8 C)] 98.1 F (36.7 C) (09/10 0716) Pulse Rate:  [69-104] 87 (09/10 0716) Resp:  [16-18] 16 (09/10 0716) BP: (124-132)/(63-72) 132/72 (09/10 0716) SpO2:  [96 %-99 %] 98 % (09/10 0716)  GENERAL: Awake, drowsy in NAD HEENT: - Normocephalic and atraumatic.  Headache persists, severe LUNGS -breathing comfortably with no audible wheezing CV -perfusing extremities well  NEURO:  Mental Status: Alert and awake, perseverates with repetition of words/sound, poor attention / concentration. Constant groaning as nurse was attempting to insert another IV. Language: Language exam limited by pain and attention  Cranial Nerves: PERRL EOMI, visual fields full  no facial asymmetry, hearing intact, tongue/uvula/soft palate midline,  Motor: Pronator drift of the left upper extremity > right upper extremity. Increased tonicity to LUE, unable to supinate. Physiologic tremors seem lessened today.  Sensation- Intact to light touch throughout and equal on both sides   On attending examination at 2:45 PM, slightly improved pain per patient report Otherwise similar exam, stable from prior  Medications  Current Facility-Administered Medications:    0.9 %  sodium chloride infusion, , Intravenous, Continuous, Charlynne Cousins, MD   acetaminophen (TYLENOL) tablet 650 mg, 650 mg, Oral, Q6H PRN,  650 mg at 12/30/21 0649 **OR** acetaminophen (TYLENOL) suppository 650 mg, 650 mg, Rectal, Q6H PRN, Kristopher Oppenheim, DO   aspirin EC tablet 81 mg, 81 mg, Oral, Daily, Kristopher Oppenheim, DO, 81 mg at 12/30/21 0931   donepezil (ARICEPT) tablet 10 mg, 10 mg, Oral, QHS, Chen, Eric, DO, 10 mg at 12/30/21 2143   guaiFENesin (MUCINEX) 12 hr tablet 600 mg, 600 mg, Oral, BID, Darrick Meigs, Gagan S, MD, 600 mg at 12/30/21 0931   heparin ADULT infusion 100 units/mL (25000 units/255mL), 950 Units/hr, Intravenous, Continuous, Reome, Earle J, RPH, Last Rate: 9.5 mL/hr at 12/30/21 1613, 950 Units/hr at 12/30/21 1613   HYDROmorphone (DILAUDID) injection 1 mg, 1 mg, Intravenous, Once, [place], Gagan S, MD   insulin aspart (novoLOG) injection 0-15 Units, 0-15 Units, Subcutaneous, TID WC, Cru Kritikos L, MD, 2 Units at 12/30/21 0649   ipratropium-albuterol (DUONEB) 0.5-2.5 (3) MG/3ML nebulizer solution 3 mL, 3 mL, Nebulization, Q6H PRN, Kristopher Oppenheim, DO   levothyroxine (SYNTHROID) tablet 88 mcg, 88 mcg, Oral, QAC breakfast, Kristopher Oppenheim, DO, 88 mcg at 12/30/21 0649   montelukast (SINGULAIR) tablet 10 mg, 10 mg, Oral, QHS, Chen, Eric, DO, 10 mg at 12/29/21 2125   mupirocin ointment (BACTROBAN) 2 % 1 Application, 1 Application, Nasal, BID, Darrick Meigs, Marge Duncans, MD, 1 Application at [id number]   ondansetron (ZOFRAN) injection 4 mg, 4 mg, Intravenous, Q6H PRN, Charlynne Cousins, MD   ondansetron [facility]) tablet 4 mg, 4 mg, Oral, Q6H PRN **OR** [DISCONTINUED] ondansetron (ZOFRAN) injection 4 mg, 4 mg, Intravenous, Q6H PRN, Kristopher Oppenheim, DO, 4 mg at  12/30/21 2139   Oral care mouth rinse, 15 mL, Mouth Rinse, PRN, Sharl Ma, Sarina Ill, MD   oxyCODONE (Oxy IR/ROXICODONE) immediate release tablet 5 mg, 5 mg, Oral, Q4H PRN, Chidera Dearcos L, MD, 5 mg at 12/31/21 0220   pantoprazole (PROTONIX) EC tablet 40 mg, 40 mg, Oral, BID, Carollee Herter, DO, 40 mg at 12/30/21 2143   polyethylene glycol (MIRALAX / GLYCOLAX) packet 17 g, 17 g, Oral, BID, Marinda Elk, MD,  17 g at 12/30/21 0931   polyethylene glycol (MIRALAX / GLYCOLAX) packet 17 g, 17 g, Oral, Daily, Marinda Elk, MD   pregabalin (LYRICA) capsule 50 mg, 50 mg, Oral, BID, Carollee Herter, DO, 50 mg at 12/30/21 2143   topiramate (TOPAMAX) tablet 200 mg, 200 mg, Oral, BID, Carollee Herter, DO, 200 mg at 12/30/21 2143 Labs   Basic Metabolic Panel: Recent Labs  Lab 12/25/21 0341 12/27/21 0824  NA 141 143  K 3.9 3.5  CL 110 114*  CO2 25 21*  GLUCOSE 121* 94  BUN 32* 26*  CREATININE 1.29* 1.14*  CALCIUM 8.9 8.7*     CBC: Recent Labs  Lab 12/25/21 0341 12/27/21 0824 12/31/21 0207  WBC 13.3* 21.4* 15.6*  NEUTROABS  --  14.9*  --   HGB 11.5* 12.6 12.9  HCT 36.2 39.0 39.0  MCV 87.9 85.9 84.2  PLT 214 223 377   Heparin level 0.33  Lab Results  Component Value Date   ESRSEDRATE 57 (H) 12/23/2021   Lab Results  Component Value Date   ESRSEDRATE 60 (H) 12/27/2021    Lab Results  Component Value Date   CRP 21.3 (H) 12/23/2021   Lab Results  Component Value Date   CRP 22.7 (H) 12/27/2021      Imaging I have reviewed images in epic and the results pertinent to this consultation are:  MRI brain w/ and w/o 9/6 personally reviewed, agree with radiology:   1. No acute intracranial process. No evidence of acute or subacute infarct. No abnormal parenchymal or meningeal enhancement. 2. No findings suggestive of spontaneous intracranial hypotension or CSF leak. No dural thickening/enhancement, enlargement of the pituitary gland, or expansion of the venous sinuses. 3. Air-fluid level in the right maxillary sinus, as can be seen with acute sinusitis. Correlate with symptoms.  CT-scan of the brain 1. No acute intracranial pathology. 2. Old right MCA territory infarct and encephalomalacia. 3. Paranasal sinus disease and right mastoid effusion.   MRI examination of the brain 9/2 1. No acute intracranial abnormality. 2. Chronic right MCA distribution infarct with underlying  mild chronic microvascular ischemic disease.  [place] bilateral temporal - Absence of a "halo" sign in the bilateral temporal artery, although not  definitive, makes a diagnosis of temporal arteritis   MR THORACIC SPINE IMPRESSION No acute findings in the thoracic spine.  No significant stenosis. Re-reviewed with Neuroradiology today, in particular possible leak at the right T9 nerve root though this is not a definite finding   MR LUMBAR SPINE IMPRESSION Multilevel degenerative changes of the lumbar spine, worst from L3 through S1, or some was below: L3-L4: Mild spinal canal and bilateral subarticular stenosis. Moderate left and mild-to-moderate right neural foraminal stenosis. L4-L5: Severe right neural foraminal stenosis likely impinging the exiting nerve root. Moderate right and subarticular stenosis encroaching the descending L5 nerve root. Mild left subarticular and moderate left neural foraminal stenosis. L5-S1: Degenerative grade 1 anterolisthesis. Moderate left and mild-to-moderate right neural foraminal stenosis. There are small perineural cysts on the left at  T7-T8 and T8-T9 and on the right at T9-T10. Tiny right-sided perineural cyst at S2.  CT myleogram personally reviewed, agree with radiology:   1. Broad-based disc protrusion at L4-5 with asymmetric right-sided facet hypertrophy resulting in moderate foraminal stenosis bilaterally. 2. CSF contrast extends into the right foramen at L4-5 and beyond the right foramen, suggesting CSF leak. 3. 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 These can be associated with CSF leaks without definite leak. 4. Right laminectomy at L5-S1 with mild foraminal stenosis, worse right than left. 5. Multilevel spondylosis of the cervical spine with mild central and moderate to severe foraminal stenosis at C5-6. 6. Mild right foraminal narrowing at C2-3 and C3-4. 7.  Moderate left foraminal stenosis at C3-4. 8. Moderate foraminal stenosis at L3-4 is worse left than right. 9. Pulmonary fibrosis. 10. More consolidated airspace disease in the right lower lobe is concerning for pneumonia. 11. Aortic Atherosclerosis (ICD10-I70.0).  EEG 9/6 - Periodic discharges with triphasic morphology, generalized ( GPDs) - Continuous slow, generalized EEG 9/9 1322 to 1000 No posterior dominant rhythm was seen. EEG showed continuous generalized 3 to 6 Hz theta-delta slowing, at times with triphasic morphology.  Hyperventilation and photic stimulation were not performed.     Latest Reference Range & Units 12/27/21 13:20  Appearance, CSF CLEAR  HAZY !  Glucose, CSF 40 - 70 mg/dL 56  RBC Count, CSF 0 /cu mm 2,475 (H)  WBC, CSF 0 - 5 /cu mm 4  Other Cells, CSF  TOO FEW TO COUNT, SMEAR AVAILABLE FOR REVIEW  Color, CSF COLORLESS  PINK !  Supernatant  CLEAR  Total  Protein, CSF 15 - 45 mg/dL 81 (H)  Tube #  1  !: Data is abnormal (H): Data is abnormally high  Assessment:  76 yo WF with hx of afib, on xarelto, hx of CVA with residual right sided weakness, hx of cluster HA and migraines, CKD stage 3a, COPD, pulmonary fibrosis, seizure disorder, presented to ER with bitemporal headache today.  Initial concern was for possible GCA given her elevated inflammatory markers, and she was treated with 3 days of pulse dose steroids (9/2-9/4) without improvement in her headache.  On observation her headache has a severe significant positional quality highly concerning for CSF leak headache, but no etiology has been identified on MRI T or L-spine. MRI Brain not c/w CSF leak headache findings, but this is a poorly sensitive exam so proceeded to CT Myelogram which identified a likely leak in the lumbar spine. Greatly appreciate blood patch performed by Dr. Alfredo Batty on 9/6; CSF sampled at the time c/w low pressure (CSF had to be obtained with gentle aspiration and c/w traumatic tap with culture  negative to date).    EEG confirms she is not having intermittent and undetected seizures  Unfortunately her headache is persistent and remains quite positional. Unfortunately as caffeine as an adjunct has been ineffective and our supply of this medication is critically low so we discontinued it.  At this time given treatment failure here, family is requesting transfer to any academic center that may be able to help; given no beds we will attempt to see if radiology may assist on 9/11  Recommendations:  #Headache, suspect low pressure CSF headache secondary to spontaneous leak; unresolved -Will continue to reach out to [place], [facility] and [facility] for anyone with beds available given the patient's refractory symptoms and bedbound status secondary to  significant pain from this condition -Continue Heparin drip for atrial fibrillation for when repeat procedure may be attempted; will hold heparin drip starting at midnight in anticipation of procedure tomorrow -Give headache cocktail with IV acetaminophen 1000 mg + Reglan 5 mg and Benadryl 12.5 for nausea/headache q6hr x 4 doses; 2 g IV mag -encourage PO caffeine -appreciate IVF per primary team  #Fluctuating left-sided weakness, stable -Spot EEG and long-term EEG monitoring negative for seizure activity  Lesleigh Noe MD-PhD [facility] [phone] Available 7 AM to 7 PM, outside these hours please contact Neurologist on call listed on AMION

## 2022-01-01 ENCOUNTER — Inpatient Hospital Stay (HOSPITAL_COMMUNITY): Payer: Medicare HMO

## 2022-01-01 DIAGNOSIS — G9600 Cerebrospinal fluid leak, unspecified: Secondary | ICD-10-CM | POA: Diagnosis not present

## 2022-01-01 HISTORY — PX: IR FLUORO GUIDED NEEDLE PLC ASPIRATION/INJECTION LOC: IMG2395

## 2022-01-01 LAB — CBC
HCT: 39.9 % (ref 36.0–46.0)
Hemoglobin: 12.7 g/dL (ref 12.0–15.0)
MCH: 27.5 pg (ref 26.0–34.0)
MCHC: 31.8 g/dL (ref 30.0–36.0)
MCV: 86.4 fL (ref 80.0–100.0)
Platelets: 351 10*3/uL (ref 150–400)
RBC: 4.62 MIL/uL (ref 3.87–5.11)
RDW: 17.5 % — ABNORMAL HIGH (ref 11.5–15.5)
WBC: 12.7 10*3/uL — ABNORMAL HIGH (ref 4.0–10.5)
nRBC: 0 % (ref 0.0–0.2)

## 2022-01-01 LAB — BASIC METABOLIC PANEL
Anion gap: 9 (ref 5–15)
BUN: 18 mg/dL (ref 8–23)
CO2: 17 mmol/L — ABNORMAL LOW (ref 22–32)
Calcium: 8 mg/dL — ABNORMAL LOW (ref 8.9–10.3)
Chloride: 115 mmol/L — ABNORMAL HIGH (ref 98–111)
Creatinine, Ser: 1.17 mg/dL — ABNORMAL HIGH (ref 0.44–1.00)
GFR, Estimated: 48 mL/min — ABNORMAL LOW (ref >60)
Glucose, Bld: 92 mg/dL (ref 70–99)
Potassium: 2.8 mmol/L — ABNORMAL LOW (ref 3.5–5.1)
Sodium: 141 mmol/L (ref 135–145)

## 2022-01-01 LAB — GLUCOSE, CAPILLARY
Glucose-Capillary: 88 mg/dL (ref 70–99)
Glucose-Capillary: 96 mg/dL (ref 70–99)
Glucose-Capillary: 111 mg/dL — ABNORMAL HIGH (ref 70–99)
Glucose-Capillary: 78 mg/dL (ref 70–99)

## 2022-01-01 LAB — HEPARIN LEVEL (UNFRACTIONATED): Heparin Unfractionated: <0.1 IU/mL — ABNORMAL LOW (ref 0.30–0.70)

## 2022-01-01 MED ORDER — LORAZEPAM 1 MG PO TABS
1.0000 mg | ORAL_TABLET | Freq: Two times a day (BID) | ORAL | Status: DC
Start: 2022-01-01 — End: 2022-01-01

## 2022-01-01 MED ORDER — HYDROMORPHONE HCL 1 MG/ML IJ SOLN
1.0000 mg | INTRAMUSCULAR | Status: AC | PRN
  Administered 2022-01-02: 1 mg via INTRAVENOUS

## 2022-01-01 MED ORDER — FENTANYL CITRATE PF 50 MCG/ML IJ SOSY: 50.0000 ug | PREFILLED_SYRINGE | Freq: Once | INTRAMUSCULAR | Status: DC | PRN

## 2022-01-01 MED ORDER — LIDOCAINE HCL (PF) 1 % IJ SOLN
INTRAMUSCULAR | Status: AC
  Filled 2022-01-01: qty 30

## 2022-01-01 MED ORDER — LORAZEPAM 1 MG PO TABS
1.0000 mg | ORAL_TABLET | Freq: Once | ORAL | Status: AC
  Administered 2022-01-01: 1 mg via ORAL
  Filled 2022-01-01: qty 1

## 2022-01-01 MED ORDER — STERILE WATER FOR INJECTION IJ SOLN
INTRAMUSCULAR | Status: AC
  Filled 2022-01-01: qty 10

## 2022-01-01 MED ORDER — MIDAZOLAM HCL 2 MG/2ML IJ SOLN: 1.0000 mg | Freq: Once | INTRAMUSCULAR | Status: DC | PRN

## 2022-01-01 MED ORDER — POTASSIUM CHLORIDE 20 MEQ PO PACK
40.0000 meq | PACK | Freq: Four times a day (QID) | ORAL | Status: DC
  Filled 2022-01-01: qty 2

## 2022-01-01 MED ORDER — IOHEXOL 180 MG/ML  SOLN
20.0000 mL | Freq: Once | INTRAMUSCULAR | Status: AC | PRN
  Administered 2022-01-01: 2 mL via INTRATHECAL

## 2022-01-01 MED ORDER — POTASSIUM CHLORIDE CRYS ER 20 MEQ PO TBCR
40.0000 meq | EXTENDED_RELEASE_TABLET | Freq: Four times a day (QID) | ORAL | Status: AC
  Administered 2022-01-01 (×2): 40 meq via ORAL
  Filled 2022-01-01 (×2): qty 2

## 2022-01-01 NOTE — Progress Notes (Addendum)
[facility] PROGRESS NOTE    Progress Note  [name]  UJW:[id number] DOB: Dec 16, 1945 DOA: 12/22/2021 PCP: Townsend Roger, MD     Brief Narrative:   Jenny Kelly is an 76 y.o. female past medical history of atrial fibrillation on Xarelto, history of CVA with right residual weakness, migraines chronic kidney disease stage IIIa, pulmonary fibrosis seizure disorder came into the ED for bitemporal headaches associated with photophobia.  Patient is on Topamax and Lyrica for history of seizures and headache. In the ED, ESR and CRP were elevated neurology was consulted.  MRI of the brain and CT of the head were negative.  Due to elevated markers and bitemporal headache temporal arteritis was considered ultrasound did not show halo sign vascular surgery was consulted for possible biopsy Xarelto is on hold, she was started on steroids but she did not improve with these were stopped.  Neurology recommended CT myelogram showed CSF contrast extending into the right foraminal L4-L5.  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 Neurology consulted IR status post blood patch on 12/27/2021. Continues to have refractory headaches, neurology recommended to continue the patient to be bedbound. They recommended to hold heparin for an attempt and a second blood patch on 01/01/2022.  She was given a cocktail of IV Reglan. Started on gentle IV fluids due to her significant decreased oral intake. Continue MiraLAX.  She continues to have headaches and nausea. Further management per neurology.  Acute bronchitis/pulmonary fibrosis: She completed a course of Doxy cycling in the house.  History of CVA with residual deficits: Noted.  Paroxysmal atrial fibrillation: Xarelto on hold, now on IV heparin  Chronic disease stage IIIa: Creatinine at baseline.  Chronic diastolic heart  failure: Appears euvolemic and stable.  COPD: Stable.   DVT prophylaxis: SCD Family Communication:husband Status is: Inpatient Remains inpatient appropriate because: headache    Code Status:     Code Status Orders  (From admission, onward)           Start     Ordered   12/23/21 0811  Do not attempt resuscitation (DNR)  Continuous       Question Answer Comment  In the event of cardiac or respiratory ARREST Do not call a "code blue"   In the event of cardiac or respiratory ARREST Do not perform Intubation, CPR, defibrillation or ACLS   In the event of cardiac or respiratory ARREST Use medication by any route, position, wound care, and other measures to relive pain and suffering. May use oxygen, suction and manual treatment of airway obstruction as needed for comfort.      12/23/21 0810           Code Status History     Date Active Date Inactive Code Status Order ID Comments User Context   11/01/2021 0815 11/04/2021 1854 DNR [id number]  Norval Morton, MD ED   07/01/2021 0927 07/07/2021 1851 DNR [id number]  Karmen Bongo, MD ED   05/07/2021 1433 05/16/2021 1732 Partial Code [id number]  Charlett Blake, MD Inpatient   05/07/2021 1431 05/07/2021 1433 Full Code [id number]  Charlett Blake, MD Inpatient   05/01/2021 1755 05/07/2021 1357 Partial Code [id number] Intubation short term only if needed Germain Osgood, PA-C ED         IV Access:   Peripheral IV   Procedures and diagnostic studies:   Overnight EEG  with video  Result Date: 12/30/2021 Lora Havens, MD     12/30/2021  3:42 PM Patient Name: Jenny Kelly MRN: [id number] Epilepsy Attending: Lora Havens Referring Physician/Provider: Lorenza Chick, MD Duration: 12/29/2021 1322 to 12/30/2021 1322  Patient history: 76 yo WF with hx of afib, on xarelto, hx of CVA with residual right sided weakness, hx of cluster HA and migraines, CKD stage 3a, COPD, pulmonary fibrosis, seizure disorder, presented to ER with  bitemporal headache and has intermittent right sided weakness. EEG to evaluate for seizure  Level of alertness: lethargic  AEDs during EEG study: TPM, PGB  Technical aspects: This EEG study was done with scalp electrodes positioned according to the 10-20 International system of electrode placement. Electrical activity was reviewed with band pass filter of 1-$RemoveBef'70Hz'MAnZOTdoPL$ , sensitivity of 7 uV/mm, display speed of 15mm/sec with a $Remo'60Hz'LPRdj$  notched filter applied as appropriate. EEG data were recorded continuously and digitally stored.  Video monitoring was available and reviewed as appropriate.  Description: No posterior dominant rhythm was seen. EEG showed continuous generalized 3 to 6 Hz theta-delta slowing, at times with triphasic morphology.  Hyperventilation and photic stimulation were not performed.    ABNORMALITY - Continuous slow, generalized  IMPRESSION: This study is suggestive of moderate diffuse encephalopathy, nonspecific etiology. No seizures or epileptiform discharges were seen were seen throughout the recording. [facility]:   None.   Subjective:    [name] nausea is improved.  Objective:    Vitals:   12/31/21 2000 01/01/22 0000 01/01/22 0400 01/01/22 0722  BP: (!) 122/55 129/63 118/66 126/72  Pulse: 89 74 79 73  Resp: $Remo'16 18 16 20  'nLXNm$ Temp: 98.6 F (37 C) 98.2 F (36.8 C) 98 F (36.7 C) 97.8 F (36.6 C)  TempSrc: Oral Oral Oral Oral  SpO2: 97% 98% 98% 100%  Weight:      Height:       SpO2: 100 %   Intake/Output Summary (Last 24 hours) at 01/01/2022 0754 Last data filed at 01/01/2022 0630 Gross per 24 hour  Intake 1258.97 ml  Output 600 ml  Net 658.97 ml    Filed Weights   12/26/21 0437 12/27/21 0430 12/30/21 0500  Weight: 60.1 kg 60.1 kg 64.6 kg    Exam: General exam: In no acute distress. Respiratory system: Good air movement and clear to auscultation. Cardiovascular system: S1 & S2 heard, RRR. No JVD. Gastrointestinal system:  Abdomen is nondistended, soft and nontender.  Extremities: No pedal edema. Skin: No rashes, lesions or ulcers Psychiatry: Judgement and insight appear normal. Mood & affect appropriate. Data Reviewed:    Labs: Basic Metabolic Panel: Recent Labs  Lab 12/27/21 0824  NA 143  K 3.5  CL 114*  CO2 21*  GLUCOSE 94  BUN 26*  CREATININE 1.14*  CALCIUM 8.7*    GFR Estimated Creatinine Clearance: 37 mL/min (A) (by C-G formula based on SCr of 1.14 mg/dL (H)). Liver Function Tests: Recent Labs  Lab 12/27/21 0824  AST 12*  ALT 11  ALKPHOS 81  BILITOT 0.6  PROT 6.0*  ALBUMIN 2.1*    No results for input(s): "LIPASE", "AMYLASE" in the last 168 hours. No results for input(s): "AMMONIA" in the last 168 hours. Coagulation profile No results for input(s): "INR", "PROTIME" in the last 168 hours. COVID-19 Labs  No results for input(s): "DDIMER", "FERRITIN", "LDH", "CRP" in the last 72 hours.   Lab Results  Component Value Date   SARSCOV2NAA  NEGATIVE 12/22/2021   SARSCOV2NAA NEGATIVE 11/01/2021   SARSCOV2NAA NEGATIVE 07/01/2021   [place] NEGATIVE 05/01/2021    CBC: Recent Labs  Lab 12/27/21 0824 12/31/21 0207  WBC 21.4* 15.6*  NEUTROABS 14.9*  --   HGB 12.6 12.9  HCT 39.0 39.0  MCV 85.9 84.2  PLT 223 377    Cardiac Enzymes: No results for input(s): "CKTOTAL", "CKMB", "CKMBINDEX", "TROPONINI" in the last 168 hours. BNP (last 3 results) No results for input(s): "PROBNP" in the last 8760 hours. CBG: Recent Labs  Lab 12/31/21 0608 12/31/21 1143 12/31/21 1604 12/31/21 2104 01/01/22 0609  GLUCAP 112* 111* 127* 101* 88    D-Dimer: No results for input(s): "DDIMER" in the last 72 hours. Hgb A1c: No results for input(s): "HGBA1C" in the last 72 hours. Lipid Profile: No results for input(s): "CHOL", "HDL", "LDLCALC", "TRIG", "CHOLHDL", "LDLDIRECT" in the last 72 hours. Thyroid function studies: No results for input(s): "TSH", "T4TOTAL", "T3FREE", "THYROIDAB"  in the last 72 hours.  Invalid input(s): "FREET3" Anemia work up: No results for input(s): "VITAMINB12", "FOLATE", "FERRITIN", "TIBC", "IRON", "RETICCTPCT" in the last 72 hours. Sepsis Labs: Recent Labs  Lab 12/27/21 0824 12/31/21 0207  WBC 21.4* 15.6*    Microbiology Recent Results (from the past 240 hour(s))  Resp Panel by RT-PCR (Flu A&B, Covid) Anterior Nasal Swab     Status: None   Collection Time: 12/22/21  9:55 PM   Specimen: Anterior Nasal Swab  Result Value Ref Range Status   SARS Coronavirus 2 by RT PCR NEGATIVE NEGATIVE Final    Comment: (NOTE) SARS-CoV-2 target nucleic acids are NOT DETECTED.  The SARS-CoV-2 RNA is generally detectable in upper respiratory specimens during the acute phase of infection. The lowest concentration of SARS-CoV-2 viral copies this assay can detect is 138 copies/mL. A negative result does not preclude SARS-Cov-2 infection and should not be used as the sole basis for treatment or other patient management decisions. A negative result may occur with  improper specimen collection/handling, submission of specimen other than nasopharyngeal swab, presence of viral mutation(s) within the areas targeted by this assay, and inadequate number of viral copies(<138 copies/mL). A negative result must be combined with clinical observations, patient history, and epidemiological information. The expected result is Negative.  Fact Sheet for Patients:  EntrepreneurPulse.com.au  Fact Sheet for Healthcare Providers:  IncredibleEmployment.be  This test is no t yet approved or cleared by the [place] FDA and  has been authorized for detection and/or diagnosis of SARS-CoV-2 by FDA under an Emergency Use Authorization (EUA). This EUA will remain  in effect (meaning this test can be used) for the duration of the COVID-19 declaration under Section 564(b)(1) of the Act, 21 U.S.C.section 360bbb-3(b)(1), unless the  authorization is terminated  or revoked sooner.       Influenza A by PCR NEGATIVE NEGATIVE Final   Influenza B by PCR NEGATIVE NEGATIVE Final    Comment: (NOTE) The Xpert Xpress SARS-CoV-2/FLU/RSV plus assay is intended as an aid in the diagnosis of influenza from Nasopharyngeal swab specimens and should not be used as a sole basis for treatment. Nasal washings and aspirates are unacceptable for Xpert Xpress SARS-CoV-2/FLU/RSV testing.  Fact Sheet for Patients: EntrepreneurPulse.com.au  Fact Sheet for Healthcare Providers: IncredibleEmployment.be  This test is not yet approved or cleared by the [place] FDA and has been authorized for detection and/or diagnosis of SARS-CoV-2 by FDA under an Emergency Use Authorization (EUA). This EUA will remain in effect (meaning this test can be used) for  the duration of the COVID-19 declaration under Section 564(b)(1) of the Act, 21 U.S.C. section 360bbb-3(b)(1), unless the authorization is terminated or revoked.  Performed at [facility], [place] [address], [place], [place] [address]   Surgical pcr screen     Status: Abnormal   Collection Time: 12/26/21 11:59 PM   Specimen: Nasal Mucosa; Nasal Swab  Result Value Ref Range Status   MRSA, PCR NEGATIVE NEGATIVE Final   Staphylococcus aureus POSITIVE (A) NEGATIVE Final    Comment: (NOTE) The Xpert SA Assay (FDA approved for NASAL specimens in patients 9 years of age and older), is one component of a comprehensive surveillance program. It is not intended to diagnose infection nor to guide or monitor treatment. Performed at [facility], [place] [address], [place], [place] [address]   CSF culture w Gram Stain     Status: None   Collection Time: 12/27/21  1:20 PM   Specimen: CSF; Cerebrospinal Fluid  Result Value Ref Range Status   Specimen Description CSF  Final   Special Requests NONE  Final   Gram Stain   Final    WBC PRESENT,BOTH PMN  AND MONONUCLEAR NO ORGANISMS SEEN CYTOSPIN SMEAR    Culture   Final    NO GROWTH 3 DAYS Performed at [facility], [place] [address], [place], [place] [address]    Report Status 12/30/2021 FINAL  Final     Medications:    aspirin EC  81 mg Oral Daily   metoCLOPramide (REGLAN) injection  5 mg Intravenous Q6H   And   diphenhydrAMINE  12.5 mg Intravenous Q6H   donepezil  10 mg Oral QHS   guaiFENesin  600 mg Oral BID    HYDROmorphone (DILAUDID) injection  1 mg Intravenous Once   insulin aspart  0-15 Units Subcutaneous TID WC   levothyroxine  88 mcg Oral QAC breakfast   montelukast  10 mg Oral QHS   pantoprazole  40 mg Oral BID   polyethylene glycol  17 g Oral Daily   pregabalin  50 mg Oral BID   topiramate  200 mg Oral BID   Continuous Infusions:  sodium chloride 50 mL/hr at 01/01/22 0631       LOS: 8 days   Charlynne Cousins  [facility]  01/01/2022, 7:54 AM

## 2022-01-01 NOTE — Progress Notes (Signed)
Neurology Progress Note   S:// Stable headache, continues to be positional, improved since she has been largely supine Briefly sits up for meds with sips / applesauce Mild nausea Highly anxious about being moved for blood patch procedure ("I can't lay on my stomach")  O:// Current vital signs: BP 126/72 (BP Location: Left Arm)   Pulse 73   Temp 97.8 F (36.6 C) (Oral)   Resp 20   Ht 5\' 2"  (1.575 m)   Wt 64.6 kg   SpO2 100%   BMI 26.05 kg/m  Vital signs in last 24 hours: Temp:  [97.7 F (36.5 C)-98.6 F (37 C)] 97.8 F (36.6 C) (09/11 0722) Pulse Rate:  [73-99] 73 (09/11 0722) Resp:  [16-20] 20 (09/11 0722) BP: (115-134)/(55-72) 126/72 (09/11 0722) SpO2:  [97 %-100 %] 100 % (09/11 0722)  GENERAL: Awake, alert, oriented to person, place, month, year and situation   Headache persists, tolerated at rest, worsens with sitting up even briefly for meds PSYCH: Highly anxious but redirectable  HEENT: Normocephalic and atraumatic.  LUNGS: breathing comfortably with no audible wheezing CV: perfusing extremities well  NEURO:  Mental Status: Alert and awake, appropriately conversant at rest but speech gets perseverative when anxious  Language: Language exam limited by anxiety but fluent when more relaxed Cranial Nerves: PER, EOMI (no pain w/ eye movement today), no facial asymmetry, hearing intact, Motor: Pronator drift of the left upper extremity > right upper extremity. Increased tonicity to LUE, unable to supinate fully. Anxious flapping of the RUE and RLE when stressed/anxious, distractible  Sensation: Intact to light touch throughout and equal on both sides  On attending examination at 2:45 PM, slightly improved pain per patient report Otherwise similar exam, stable from prior  Medications  Current Facility-Administered Medications:    aspirin EC tablet 81 mg, 81 mg, Oral, Daily, Kristopher Oppenheim, DO, 81 mg at 12/31/21 0955   [COMPLETED] acetaminophen (OFIRMEV) IV 1,000 mg,  1,000 mg, Intravenous, Q6H, Last Rate: 400 mL/hr at 01/01/22 0632, 1,000 mg at 01/01/22 [id number] **AND** metoCLOPramide (REGLAN) injection 5 mg, 5 mg, Intravenous, Q6H, 5 mg at 01/01/22 0622 **AND** diphenhydrAMINE (BENADRYL) injection 12.5 mg, 12.5 mg, Intravenous, Q6H, Madalene Mickler L, MD, 12.5 mg at 01/01/22 0624   donepezil (ARICEPT) tablet 10 mg, 10 mg, Oral, QHS, Chen, Eric, DO, 10 mg at 12/31/21 2211   guaiFENesin (MUCINEX) 12 hr tablet 600 mg, 600 mg, Oral, BID, Darrick Meigs, Gagan S, MD, 600 mg at 01/01/22 1001   HYDROmorphone (DILAUDID) injection 1 mg, 1 mg, Intravenous, Once, [place], Gagan S, MD   insulin aspart (novoLOG) injection 0-15 Units, 0-15 Units, Subcutaneous, TID WC, Regana Kemple L, MD, 2 Units at 12/30/21 0649   ipratropium-albuterol (DUONEB) 0.5-2.5 (3) MG/3ML nebulizer solution 3 mL, 3 mL, Nebulization, Q6H PRN, Kristopher Oppenheim, DO   levothyroxine (SYNTHROID) tablet 88 mcg, 88 mcg, Oral, QAC breakfast, Kristopher Oppenheim, DO, 88 mcg at 01/01/22 0634   montelukast (SINGULAIR) tablet 10 mg, 10 mg, Oral, QHS, Chen, Eric, DO, 10 mg at 12/31/21 2214   ondansetron (ZOFRAN) injection 4 mg, 4 mg, Intravenous, Q6H PRN, Charlynne Cousins, MD, 4 mg at 12/31/21 2134   ondansetron (ZOFRAN) tablet 4 mg, 4 mg, Oral, Q6H PRN, 4 mg at 01/01/22 1001 **OR** [DISCONTINUED] ondansetron (ZOFRAN) injection 4 mg, 4 mg, Intravenous, Q6H PRN, Kristopher Oppenheim, DO, 4 mg at 12/30/21 2139   Oral care mouth rinse, 15 mL, Mouth Rinse, PRN, Darrick Meigs, Marge Duncans, MD   oxyCODONE (Oxy IR/ROXICODONE) immediate release tablet 5  mg, 5 mg, Oral, Q4H PRN, Jahna Liebert L, MD, 5 mg at 12/31/21 0955   pantoprazole (PROTONIX) EC tablet 40 mg, 40 mg, Oral, BID, Carollee Herter, DO, 40 mg at 01/01/22 1001   polyethylene glycol (MIRALAX / GLYCOLAX) packet 17 g, 17 g, Oral, Daily, Marinda Elk, MD, 17 g at 12/31/21 0955   potassium chloride (KLOR-CON) packet 40 mEq, 40 mEq, Oral, Q6H, Marinda Elk, MD   pregabalin (LYRICA) capsule 50 mg, 50  mg, Oral, BID, Carollee Herter, DO, 50 mg at 01/01/22 1002   topiramate (TOPAMAX) tablet 200 mg, 200 mg, Oral, BID, Carollee Herter, DO, 200 mg at 01/01/22 1001  Labs Basic Metabolic Panel: Recent Labs  Lab 12/27/21 0824 01/01/22 0819  NA 143 141  K 3.5 2.8*  CL 114* 115*  CO2 21* 17*  GLUCOSE 94 92  BUN 26* 18  CREATININE 1.14* 1.17*  CALCIUM 8.7* 8.0*     CBC: Recent Labs  Lab 12/27/21 0824 12/31/21 0207 01/01/22 0819  WBC 21.4* 15.6* 12.7*  NEUTROABS 14.9*  --   --   HGB 12.6 12.9 12.7  HCT 39.0 39.0 39.9  MCV 85.9 84.2 86.4  PLT 223 377 351  Heparin level 0.33  Lab Results  Component Value Date   ESRSEDRATE 57 (H) 12/23/2021   Lab Results  Component Value Date   ESRSEDRATE 60 (H) 12/27/2021    Lab Results  Component Value Date   CRP 21.3 (H) 12/23/2021   Lab Results  Component Value Date   CRP 22.7 (H) 12/27/2021      Imaging I have reviewed images in epic and the results pertinent to this consultation are:  MRI brain w/ and w/o 9/6 personally reviewed, agree with radiology:   1. No acute intracranial process. No evidence of acute or subacute infarct. No abnormal parenchymal or meningeal enhancement. 2. No findings suggestive of spontaneous intracranial hypotension or CSF leak. No dural thickening/enhancement, enlargement of the pituitary gland, or expansion of the venous sinuses. 3. Air-fluid level in the right maxillary sinus, as can be seen with acute sinusitis. Correlate with symptoms.  CT-scan of the brain 1. No acute intracranial pathology. 2. Old right MCA territory infarct and encephalomalacia. 3. Paranasal sinus disease and right mastoid effusion.   MRI examination of the brain 9/2 1. No acute intracranial abnormality. 2. 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  [place] bilateral temporal - Absence of a "halo" sign in the bilateral temporal artery, although not  definitive, makes a  diagnosis of temporal arteritis   MR THORACIC SPINE IMPRESSION No acute findings in the thoracic spine.  No significant stenosis. Re-reviewed with Neuroradiology today, in particular possible leak at the right T9 nerve root though this is not a definite finding   MR LUMBAR SPINE IMPRESSION Multilevel degenerative changes of the lumbar spine, worst from L3 through S1, or some was below: L3-L4: Mild spinal canal and bilateral subarticular stenosis. Moderate left and mild-to-moderate right neural foraminal stenosis. L4-L5: Severe right neural foraminal stenosis likely impinging the exiting nerve root. Moderate right and subarticular stenosis encroaching the descending L5 nerve root. Mild left subarticular and moderate left neural foraminal stenosis. L5-S1: Degenerative grade 1 anterolisthesis. Moderate left and mild-to-moderate right neural foraminal stenosis. There are small perineural cysts on the left at T7-T8 and T8-T9 and on the right at T9-T10. Tiny right-sided perineural cyst at S2.  CT myleogram personally reviewed, agree with radiology:   1. Broad-based disc protrusion at  L4-5 with asymmetric right-sided facet hypertrophy resulting in moderate foraminal stenosis bilaterally. 2. CSF contrast extends into the right foramen at L4-5 and beyond the right foramen, suggesting CSF leak. 3. 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 These can be associated with CSF leaks without definite leak. 4. Right laminectomy at L5-S1 with mild foraminal stenosis, worse right than left. 5. Multilevel spondylosis of the cervical spine with mild central and moderate to severe foraminal stenosis at C5-6. 6. Mild right foraminal narrowing at C2-3 and C3-4. 7. Moderate left foraminal stenosis at C3-4. 8. Moderate foraminal stenosis at L3-4 is worse left than right. 9. Pulmonary fibrosis. 10. More consolidated airspace  disease in the right lower lobe is concerning for pneumonia. 11. Aortic Atherosclerosis (ICD10-I70.0).  EEG 9/6 - Periodic discharges with triphasic morphology, generalized ( GPDs) - Continuous slow, generalized EEG 9/9 1322 to 1000 No posterior dominant rhythm was seen. EEG showed continuous generalized 3 to 6 Hz theta-delta slowing, at times with triphasic morphology.  Hyperventilation and photic stimulation were not performed.     Latest Reference Range & Units 12/27/21 13:20  Appearance, CSF CLEAR  HAZY !  Glucose, CSF 40 - 70 mg/dL 56  RBC Count, CSF 0 /cu mm 2,475 (H)  WBC, CSF 0 - 5 /cu mm 4  Other Cells, CSF  TOO FEW TO COUNT, SMEAR AVAILABLE FOR REVIEW  Color, CSF COLORLESS  PINK !  Supernatant  CLEAR  Total  Protein, CSF 15 - 45 mg/dL 81 (H)  Tube #  1  !: Data is abnormal (H): Data is abnormally high  Assessment:  76 yo WF with hx of afib, on xarelto, hx of CVA with residual right sided weakness, hx of cluster HA and migraines, CKD stage 3a, COPD, pulmonary fibrosis, seizure disorder, presented to ER with bitemporal headache today.  Initial concern was for possible GCA given her elevated inflammatory markers, and she was treated with 3 days of pulse dose steroids (9/2-9/4) without improvement in her headache.  On further observation her headache has a severe significant positional quality highly concerning for CSF leak headache, but no etiology has been identified on MRI T or L-spine. MRI Brain not c/w CSF leak headache findings, but this is a poorly sensitive exam so proceeded to CT Myelogram which identified a likely leak in the lumbar spine. Greatly appreciate blood patch performed by Dr. Alfredo Batty on 9/6; CSF sampled at the time c/w low pressure (CSF had to be obtained with gentle aspiration and c/w traumatic tap with culture negative to date).  Unfortunately her headache is persistent and remains quite positional.   EEG confirms she is not having intermittent and undetected  seizures.  Caffeine tabs and IVF as well as migraine cocktails (IV tylenol, reglan, bendadryl) have been ineffective.  Recommendations:  #Headache, suspect low pressure CSF headache secondary to spontaneous leak; unresolved -Will continue to reach out to [facility] and [facility] for anyone with beds available given the patient's refractory symptoms and bedbound status secondary to significant pain from this condition -Continue Heparin drip for atrial fibrillation for when repeat procedure may be attempted; will hold heparin drip starting at midnight in anticipation of procedure tomorrow -Appreciate Dr. Charise Killian consideration of repeat blood patch attempt today -s/p headache cocktail with IV acetaminophen 1000 mg + Reglan 5 mg and Benadryl 12.5 for nausea/headache q6hr x 4 doses 9/9-9/11; 2 g IV mag -encourage PO caffeine as tolerated,  -  Okay to sit up briefly as tolerated for PO meds and PO intake -appreciate IVF per primary team  #Fluctuating left-sided weakness, stable -Spot EEG and long-term EEG monitoring negative for seizure activity  Lesleigh Noe MD-PhD [facility] [phone] Available 7 AM to 7 PM, outside these hours please contact Neurologist on call listed on [place] than 50 min spent in care of this patient today, majority in direct care coordination including discussion with Dr. Nelia Shi, Rob Hickman

## 2022-01-02 DIAGNOSIS — G9600 Cerebrospinal fluid leak, unspecified: Secondary | ICD-10-CM | POA: Diagnosis not present

## 2022-01-02 LAB — HEPATIC FUNCTION PANEL
ALT: 12 U/L (ref 0–44)
AST: 18 U/L (ref 15–41)
Albumin: 2 g/dL — ABNORMAL LOW (ref 3.5–5.0)
Alkaline Phosphatase: 101 U/L (ref 38–126)
Bilirubin, Direct: <0.1 mg/dL (ref 0.0–0.2)
Total Bilirubin: 0.4 mg/dL (ref 0.3–1.2)
Total Protein: 6.3 g/dL — ABNORMAL LOW (ref 6.5–8.1)

## 2022-01-02 LAB — CBC
HCT: 44 % (ref 36.0–46.0)
Hemoglobin: 14 g/dL (ref 12.0–15.0)
MCH: 27.8 pg (ref 26.0–34.0)
MCHC: 31.8 g/dL (ref 30.0–36.0)
MCV: 87.3 fL (ref 80.0–100.0)
Platelets: 278 10*3/uL (ref 150–400)
RBC: 5.04 MIL/uL (ref 3.87–5.11)
RDW: 18.8 % — ABNORMAL HIGH (ref 11.5–15.5)
WBC: 13.8 10*3/uL — ABNORMAL HIGH (ref 4.0–10.5)
nRBC: 0 % (ref 0.0–0.2)

## 2022-01-02 LAB — BASIC METABOLIC PANEL
Anion gap: 8 (ref 5–15)
BUN: 15 mg/dL (ref 8–23)
CO2: 16 mmol/L — ABNORMAL LOW (ref 22–32)
Calcium: 8.1 mg/dL — ABNORMAL LOW (ref 8.9–10.3)
Chloride: 117 mmol/L — ABNORMAL HIGH (ref 98–111)
Creatinine, Ser: 1.15 mg/dL — ABNORMAL HIGH (ref 0.44–1.00)
GFR, Estimated: 49 mL/min — ABNORMAL LOW (ref >60)
Glucose, Bld: 83 mg/dL (ref 70–99)
Potassium: 4.2 mmol/L (ref 3.5–5.1)
Sodium: 141 mmol/L (ref 135–145)

## 2022-01-02 LAB — GLUCOSE, CAPILLARY
Glucose-Capillary: 76 mg/dL (ref 70–99)
Glucose-Capillary: 96 mg/dL (ref 70–99)
Glucose-Capillary: 85 mg/dL (ref 70–99)
Glucose-Capillary: 95 mg/dL (ref 70–99)

## 2022-01-02 LAB — MAGNESIUM: Magnesium: 2.1 mg/dL (ref 1.7–2.4)

## 2022-01-02 MED ORDER — POTASSIUM CHLORIDE IN NACL 20-0.9 MEQ/L-% IV SOLN
INTRAVENOUS | Status: AC
  Filled 2022-01-02: qty 1000

## 2022-01-02 MED ORDER — DIPHENHYDRAMINE HCL 50 MG/ML IJ SOLN
12.5000 mg | Freq: Four times a day (QID) | INTRAMUSCULAR | Status: DC
Start: 2022-01-02 — End: 2022-01-04
  Administered 2022-01-02 – 2022-01-04 (×8): 12.5 mg via INTRAVENOUS
  Filled 2022-01-02 (×8): qty 1

## 2022-01-02 MED ORDER — ACETAMINOPHEN 10 MG/ML IV SOLN
1000.0000 mg | Freq: Four times a day (QID) | INTRAVENOUS | Status: AC
  Administered 2022-01-02 – 2022-01-03 (×4): 1000 mg via INTRAVENOUS
  Filled 2022-01-02 (×4): qty 100

## 2022-01-02 MED ORDER — ENSURE ENLIVE PO LIQD
237.0000 mL | Freq: Three times a day (TID) | ORAL | Status: DC
Start: 2022-01-02 — End: 2022-01-04
  Administered 2022-01-02 (×2): 237 mL via ORAL

## 2022-01-02 MED ORDER — METOCLOPRAMIDE HCL 5 MG/ML IJ SOLN
5.0000 mg | Freq: Four times a day (QID) | INTRAMUSCULAR | Status: DC
  Administered 2022-01-02 – 2022-01-04 (×8): 5 mg via INTRAVENOUS
  Filled 2022-01-02 (×7): qty 2

## 2022-01-02 NOTE — Progress Notes (Signed)
[facility] PROGRESS NOTE    Progress Note  Jenny Kelly  ERX:[id number] DOB: 13-Feb-1946 DOA: 12/22/2021 PCP: Townsend Roger, MD     Brief Narrative:   Jenny Kelly is an 76 y.o. female past medical history of atrial fibrillation on Xarelto, history of CVA with right residual weakness, migraines chronic kidney disease stage IIIa, pulmonary fibrosis seizure disorder came into the ED for bitemporal headaches associated with photophobia.  Patient is on Topamax and Lyrica for history of seizures and headache. In the ED, ESR and CRP were elevated neurology was consulted.  MRI of the brain and CT of the head were negative.  Due to elevated markers and bitemporal headache temporal arteritis was considered ultrasound did not show halo sign vascular surgery was consulted for possible biopsy Xarelto is on hold, she was started on steroids but she did not improve with these were stopped.  Neurology recommended CT myelogram showed CSF contrast extending into the right foraminal L4-L5.  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 Neurology consulted IR status post blood patch on 12/27/2021 which was unsuccessful as she continued to have symptoms, IR neurology discussed and a second patch was placed on 01/01/2022. Patient will have to lay flat completely for the next 48 hours. Heparin is on hold. She was started on Reglan and caffeine tablets (hospital has run of caffeine tablets). Due to her decreased oral intake she was started on IV fluids Can start MiraLAX tomorrow. Further management per neurology. She will need a physical therapy evaluation once her bedrest is completed.  Acute bronchitis/pulmonary fibrosis: She completed a course of Doxy cycling in the house.  History of CVA with residual deficits: Noted.  Paroxysmal atrial fibrillation: Xarelto on hold cannot get  heparin level.  We will probably start her on Lovenox tomorrow.  Chronic disease stage IIIa: Creatinine at baseline.  Chronic diastolic heart failure: Appears euvolemic and stable.  COPD: Stable.   DVT prophylaxis: SCD Family Communication:husband Status is: Inpatient Remains inpatient appropriate because: headache    Code Status:     Code Status Orders  (From admission, onward)           Start     Ordered   12/23/21 0811  Do not attempt resuscitation (DNR)  Continuous       Question Answer Comment  In the event of cardiac or respiratory ARREST Do not call a "code blue"   In the event of cardiac or respiratory ARREST Do not perform Intubation, CPR, defibrillation or ACLS   In the event of cardiac or respiratory ARREST Use medication by any route, position, wound care, and other measures to relive pain and suffering. May use oxygen, suction and manual treatment of airway obstruction as needed for comfort.      12/23/21 0810           Code Status History     Date Active Date Inactive Code Status Order ID Comments User Context   11/01/2021 0815 11/04/2021 1854 DNR [id number]  Norval Morton, MD ED   07/01/2021 0927 07/07/2021 1851 DNR [id number]  Karmen Bongo, MD ED   05/07/2021 1433 05/16/2021 1732 Partial Code [id number]  Charlett Blake, MD Inpatient   05/07/2021 1431 05/07/2021 1433 Full Code [id number]  Charlett Blake, MD Inpatient   05/01/2021 1755 05/07/2021 1357 Partial Code [id number] Intubation short term only if needed Montey Hora  P, PA-C ED         IV Access:   Peripheral IV   Procedures and diagnostic studies:   IR Fluoro Guide Ndl Plmt / BX  Result Date: 01/01/2022 CLINICAL DATA:  Positional headaches. Possible L4-L5 CSF leak. Mild improvement for a day after L4-L5 interlaminar and right transforaminal blood patch 5 days ago. Repeat blood patch requested. FLUOROSCOPY TIME:  Radiation Exposure Index (as provided by the fluoroscopic device):  12.8 mGy Kerma PROCEDURE: LUMBAR EPIDURAL BLOOD PATCH INJECTION After a thorough discussion of risks and benefits of the procedure, written and verbal consent was obtained. Prior to the procedure, 20 ml of the patient's blood was harvested using stringent sterile technique. An interlaminar approach was performed at L4-L5 on the right. Under stringent sterile technique, overlying skin was cleansed with betadine soap and anesthetized with 1% lidocaine without epinephrine. A 3.5 inch 20 gauge needle was advanced using loss-of-resistance technique. DIAGNOSTIC EPIDURAL INJECTION Injection of Omnipaque 180 shows a good epidural pattern with spread above and below the level of needle placement, primarily in the midline. No vascular or subarachnoid opacification is seen. THERAPEUTIC EPIDURAL INJECTION 15 ml of the patient's blood was injected into the epidural space. IMPRESSION: Technically successful repeat interlaminar lumbar blood patch at L4-L5. Electronically Signed   By: Titus Dubin M.D.   On: 01/01/2022 17:03     Medical Consultants:   None.   Subjective:    Jenny Kelly nausea and headaches are significantly improved.  Objective:    Vitals:   01/01/22 1933 01/02/22 0028 01/02/22 0331 01/02/22 0500  BP: 120/74 115/73 139/62   Pulse: (!) 104 93 78   Resp: $Remo'16 17 18   'ukGWX$ Temp: 98.2 F (36.8 C) (!) 97.5 F (36.4 C) (!) 97.4 F (36.3 C)   TempSrc: Oral Oral Oral   SpO2: 98% 99% 97%   Weight:    64.2 kg  Height:       SpO2: 97 %   Intake/Output Summary (Last 24 hours) at 01/02/2022 0725 Last data filed at 01/02/2022 5993 Gross per 24 hour  Intake 300.99 ml  Output 400 ml  Net -99.01 ml    Filed Weights   12/27/21 0430 12/30/21 0500 01/02/22 0500  Weight: 60.1 kg 64.6 kg 64.2 kg    Exam: General exam: In no acute distress. Respiratory system: Good air movement and clear to auscultation. Cardiovascular system: S1 & S2 heard, RRR. No JVD. Gastrointestinal system: Abdomen is  nondistended, soft and nontender.  Extremities: No pedal edema. Skin: No rashes, lesions or ulcers Psychiatry: Judgement and insight appear normal. Mood & affect appropriate. Data Reviewed:    Labs: Basic Metabolic Panel: Recent Labs  Lab 12/27/21 0824 01/01/22 0819  NA 143 141  K 3.5 2.8*  CL 114* 115*  CO2 21* 17*  GLUCOSE 94 92  BUN 26* 18  CREATININE 1.14* 1.17*  CALCIUM 8.7* 8.0*    GFR Estimated Creatinine Clearance: 36 mL/min (A) (by C-G formula based on SCr of 1.17 mg/dL (H)). Liver Function Tests: Recent Labs  Lab 12/27/21 0824  AST 12*  ALT 11  ALKPHOS 81  BILITOT 0.6  PROT 6.0*  ALBUMIN 2.1*    No results for input(s): "LIPASE", "AMYLASE" in the last 168 hours. No results for input(s): "AMMONIA" in the last 168 hours. Coagulation profile No results for input(s): "INR", "PROTIME" in the last 168 hours. COVID-19 Labs  No results for input(s): "DDIMER", "FERRITIN", "LDH", "CRP" in the last 72 hours.   Lab  Results  Component Value Date   SARSCOV2NAA NEGATIVE 12/22/2021   SARSCOV2NAA NEGATIVE 11/01/2021   [place] NEGATIVE 07/01/2021   [place] NEGATIVE 05/01/2021    CBC: Recent Labs  Lab 12/27/21 0824 12/31/21 0207 01/01/22 0819  WBC 21.4* 15.6* 12.7*  NEUTROABS 14.9*  --   --   HGB 12.6 12.9 12.7  HCT 39.0 39.0 39.9  MCV 85.9 84.2 86.4  PLT 223 377 351    Cardiac Enzymes: No results for input(s): "CKTOTAL", "CKMB", "CKMBINDEX", "TROPONINI" in the last 168 hours. BNP (last 3 results) No results for input(s): "PROBNP" in the last 8760 hours. CBG: Recent Labs  Lab 01/01/22 0609 01/01/22 1221 01/01/22 1630 01/01/22 2217 01/02/22 0622  GLUCAP 88 96 111* 78 76    D-Dimer: No results for input(s): "DDIMER" in the last 72 hours. Hgb A1c: No results for input(s): "HGBA1C" in the last 72 hours. Lipid Profile: No results for input(s): "CHOL", "HDL", "LDLCALC", "TRIG", "CHOLHDL", "LDLDIRECT" in the last 72 hours. Thyroid  function studies: No results for input(s): "TSH", "T4TOTAL", "T3FREE", "THYROIDAB" in the last 72 hours.  Invalid input(s): "FREET3" Anemia work up: No results for input(s): "VITAMINB12", "FOLATE", "FERRITIN", "TIBC", "IRON", "RETICCTPCT" in the last 72 hours. Sepsis Labs: Recent Labs  Lab 12/27/21 0824 12/31/21 0207 01/01/22 0819  WBC 21.4* 15.6* 12.7*    Microbiology Recent Results (from the past 240 hour(s))  Surgical pcr screen     Status: Abnormal   Collection Time: 12/26/21 11:59 PM   Specimen: Nasal Mucosa; Nasal Swab  Result Value Ref Range Status   MRSA, PCR NEGATIVE NEGATIVE Final   Staphylococcus aureus POSITIVE (A) NEGATIVE Final    Comment: (NOTE) The Xpert SA Assay (FDA approved for NASAL specimens in patients 64 years of age and older), is one component of a comprehensive surveillance program. It is not intended to diagnose infection nor to guide or monitor treatment. Performed at [facility], [place] [address] [place], [place] [address]   CSF culture w Gram Stain     Status: None   Collection Time: 12/27/21  1:20 PM   Specimen: CSF; Cerebrospinal Fluid  Result Value Ref Range Status   Specimen Description CSF  Final   Special Requests NONE  Final   Gram Stain   Final    WBC PRESENT,BOTH PMN AND MONONUCLEAR NO ORGANISMS SEEN CYTOSPIN SMEAR    Culture   Final    NO GROWTH 3 DAYS Performed at [facility], [place] [address] [place], [place] [address]    Report Status 12/30/2021 FINAL  Final     Medications:    aspirin EC  81 mg Oral Daily   donepezil  10 mg Oral QHS   guaiFENesin  600 mg Oral BID    HYDROmorphone (DILAUDID) injection  1 mg Intravenous Once   insulin aspart  0-15 Units Subcutaneous TID WC   levothyroxine  88 mcg Oral QAC breakfast   montelukast  10 mg Oral QHS   pantoprazole  40 mg Oral BID   polyethylene glycol  17 g Oral Daily   pregabalin  50 mg Oral BID   topiramate  200 mg Oral BID   Continuous  Infusions:       LOS: 9 days   Jenny Kelly  [facility]  01/02/2022, 7:25 AM

## 2022-01-02 NOTE — Progress Notes (Addendum)
Neurology Progress Note   S:// Patient continues to c/o headache rated 10/10.  Very drowsy secondary to recent hydromorphone administration.  She c/o pain in her left arm and shoulder related to positioning during blood patch procedure yesterday (tele box was trapped under shoulder).  Patient's husband reports that she has not eaten much in last two days. Report headache has improved at this time to MD (however reported still 10/10 to NP)  O:// Current vital signs: BP (!) 141/53 (BP Location: Left Arm)   Pulse (!) 101   Temp 97.8 F (36.6 C) (Oral)   Resp 16   Ht 5\' 2"  (1.575 m)   Wt 64.2 kg   SpO2 97%   BMI 25.89 kg/m  Vital signs in last 24 hours: Temp:  [97.4 F (36.3 C)-98.6 F (37 C)] 97.8 F (36.6 C) (09/12 0826) Pulse Rate:  [78-104] 101 (09/12 0826) Resp:  [14-18] 16 (09/12 0826) BP: (108-148)/(53-74) 141/53 (09/12 0826) SpO2:  [97 %-99 %] 97 % (09/12 0826) Weight:  [64.2 kg] 64.2 kg (09/12 0500)  GENERAL: Drowsy, oriented to person, place, month, year and situation; disoriented on later attending evaluation but redirectable PSYCH: Drowsy and still somewhat anxious regarding pain  HEENT: Normocephalic and atraumatic.  LUNGS: breathing comfortably with no audible wheezing CV: perfusing extremities well  NEURO:  Mental Status: Drowsy, requires stimulation to participate in exam  Language: No aphasia or dysarthria Cranial Nerves: PER, EOMI (no pain w/ eye movement today), no facial asymmetry, hearing intact, Motor: Moves all extremities to command with motion of LUE limited due to pain   Sensation: Intact to light touch throughout and equal on both sides   Medications  Current Facility-Administered Medications:    0.9 % NaCl with KCl 20 mEq/ L  infusion, , Intravenous, Continuous, 07[birth year]1980, MD   aspirin EC tablet 81 mg, 81 mg, Oral, Daily, Marinda Elk, DO, 81 mg at 12/31/21 0955   donepezil (ARICEPT) tablet 10 mg, 10 mg, Oral, QHS, Chen, Eric, DO, 10  mg at 01/01/22 2201   fentaNYL (SUBLIMAZE) injection 50 mcg, 50 mcg, Intravenous, Once PRN, Jianni Batten L, MD   guaiFENesin (MUCINEX) 12 hr tablet 600 mg, 600 mg, Oral, BID, 2202, Gagan S, MD, 600 mg at 01/01/22 2201   HYDROmorphone (DILAUDID) injection 1 mg, 1 mg, Intravenous, Once, 2202, Gagan S, MD   insulin aspart (novoLOG) injection 0-15 Units, 0-15 Units, Subcutaneous, TID WC, Arne Schlender L, MD, 2 Units at 12/30/21 0649   ipratropium-albuterol (DUONEB) 0.5-2.5 (3) MG/3ML nebulizer solution 3 mL, 3 mL, Nebulization, Q6H PRN, 03/01/22, DO   levothyroxine (SYNTHROID) tablet 88 mcg, 88 mcg, Oral, QAC breakfast, Carollee Herter, DO, 88 mcg at 01/02/22 03/04/22   midazolam (VERSED) injection 1 mg, 1 mg, Intravenous, Once PRN, Verdie Wilms L, MD   montelukast (SINGULAIR) tablet 10 mg, 10 mg, Oral, QHS, Chen, Eric, DO, 10 mg at 01/01/22 2201   ondansetron (ZOFRAN) injection 4 mg, 4 mg, Intravenous, Q6H PRN, 2202, MD, 4 mg at 01/01/22 2222   ondansetron (ZOFRAN) tablet 4 mg, 4 mg, Oral, Q6H PRN, 4 mg at 01/01/22 1001 **OR** [DISCONTINUED] ondansetron (ZOFRAN) injection 4 mg, 4 mg, Intravenous, Q6H PRN, 03/03/22, DO, 4 mg at 12/30/21 2139   Oral care mouth rinse, 15 mL, Mouth Rinse, PRN, 2140, Sharl Ma, MD   oxyCODONE (Oxy IR/ROXICODONE) immediate release tablet 5 mg, 5 mg, Oral, Q4H PRN, Jillisa Harris L, MD, 5 mg at 01/01/22 2200   pantoprazole (PROTONIX)  EC tablet 40 mg, 40 mg, Oral, BID, Carollee Herter, DO, 40 mg at 01/01/22 2201   polyethylene glycol (MIRALAX / GLYCOLAX) packet 17 g, 17 g, Oral, Daily, Marinda Elk, MD, 17 g at 12/31/21 0955   pregabalin (LYRICA) capsule 50 mg, 50 mg, Oral, BID, Carollee Herter, DO, 50 mg at 01/01/22 2201   topiramate (TOPAMAX) tablet 200 mg, 200 mg, Oral, BID, Carollee Herter, DO, 200 mg at 01/01/22 2201  Labs Basic Metabolic Panel: Recent Labs  Lab 12/27/21 0824 01/01/22 0819  NA 143 141  K 3.5 2.8*  CL 114* 115*  CO2 21* 17*  GLUCOSE  94 92  BUN 26* 18  CREATININE 1.14* 1.17*  CALCIUM 8.7* 8.0*     CBC: Recent Labs  Lab 12/27/21 0824 12/31/21 0207 01/01/22 0819  WBC 21.4* 15.6* 12.7*  NEUTROABS 14.9*  --   --   HGB 12.6 12.9 12.7  HCT 39.0 39.0 39.9  MCV 85.9 84.2 86.4  PLT 223 377 351   Heparin level 0.33  Lab Results  Component Value Date   ESRSEDRATE 57 (H) 12/23/2021   Lab Results  Component Value Date   ESRSEDRATE 60 (H) 12/27/2021    Lab Results  Component Value Date   CRP 21.3 (H) 12/23/2021   Lab Results  Component Value Date   CRP 22.7 (H) 12/27/2021      Imaging I have reviewed images in epic and the results pertinent to this consultation are:  MRI brain w/ and w/o 9/6 personally reviewed, agree with radiology:   1. No acute intracranial process. No evidence of acute or subacute infarct. No abnormal parenchymal or meningeal enhancement. 2. No findings suggestive of spontaneous intracranial hypotension or CSF leak. No dural thickening/enhancement, enlargement of the pituitary gland, or expansion of the venous sinuses. 3. Air-fluid level in the right maxillary sinus, as can be seen with acute sinusitis. Correlate with symptoms.  CT-scan of the brain 1. No acute intracranial pathology. 2. Old right MCA territory infarct and encephalomalacia. 3. Paranasal sinus disease and right mastoid effusion.   MRI examination of the brain 9/2 1. No acute intracranial abnormality. 2. 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  [place] bilateral temporal - Absence of a "halo" sign in the bilateral temporal artery, although not  definitive, makes a diagnosis of temporal arteritis   MR THORACIC SPINE IMPRESSION No acute findings in the thoracic spine.  No significant stenosis. Re-reviewed with Neuroradiology today, in particular possible leak at the right T9 nerve root though this is not a definite finding   MR LUMBAR SPINE  IMPRESSION Multilevel degenerative changes of the lumbar spine, worst from L3 through S1, or some was below: L3-L4: Mild spinal canal and bilateral subarticular stenosis. Moderate left and mild-to-moderate right neural foraminal stenosis. L4-L5: Severe right neural foraminal stenosis likely impinging the exiting nerve root. Moderate right and subarticular stenosis encroaching the descending L5 nerve root. Mild left subarticular and moderate left neural foraminal stenosis. L5-S1: Degenerative grade 1 anterolisthesis. Moderate left and mild-to-moderate right neural foraminal stenosis. There are small perineural cysts on the left at T7-T8 and T8-T9 and on the right at T9-T10. Tiny right-sided perineural cyst at S2.  CT myleogram personally reviewed, agree with radiology:   1. Broad-based disc protrusion at L4-5 with asymmetric right-sided facet hypertrophy resulting in moderate foraminal stenosis bilaterally. 2. CSF contrast extends into the right foramen at L4-5 and beyond the right foramen, suggesting CSF leak. 3. Perineural root sleeve  cyst on the right at T9-10 and filling of the perineural root sleeves bilaterally at T5-6 and T6-7. These can be associated with CSF leaks without definite leak. 4. Right laminectomy at L5-S1 with mild foraminal stenosis, worse right than left. 5. Multilevel spondylosis of the cervical spine with mild central and moderate to severe foraminal stenosis at C5-6. 6. Mild right foraminal narrowing at C2-3 and C3-4. 7. Moderate left foraminal stenosis at C3-4. 8. Moderate foraminal stenosis at L3-4 is worse left than right. 9. Pulmonary fibrosis. 10. More consolidated airspace disease in the right lower lobe is concerning for pneumonia. 11. Aortic Atherosclerosis (ICD10-I70.0).  EEG 9/6 - Periodic discharges with triphasic morphology, generalized ( GPDs) - Continuous slow, generalized EEG 9/9 1322 to 1000 No posterior dominant rhythm was seen. EEG  showed continuous generalized 3 to 6 Hz theta-delta slowing, at times with triphasic morphology.  Hyperventilation and photic stimulation were not performed.     Latest Reference Range & Units 12/27/21 13:20  Appearance, CSF CLEAR  HAZY !  Glucose, CSF 40 - 70 mg/dL 56  RBC Count, CSF 0 /cu mm 2,475 (H)  WBC, CSF 0 - 5 /cu mm 4  Other Cells, CSF  TOO FEW TO COUNT, SMEAR AVAILABLE FOR REVIEW  Color, CSF COLORLESS  PINK !  Supernatant  CLEAR  Total  Protein, CSF 15 - 45 mg/dL 81 (H)  Tube #  1  !: Data is abnormal (H): Data is abnormally high  Assessment:  76 yo WF with hx of afib, on xarelto, hx of CVA with residual right sided weakness, hx of cluster HA and migraines, CKD stage 3a, COPD, pulmonary fibrosis, seizure disorder, presented to ER with bitemporal headache today.  Initial concern was for possible GCA given her elevated inflammatory markers, and she was treated with 3 days of pulse dose steroids (9/2-9/4) without improvement in her headache.  On further observation her headache has a severe significant positional quality highly concerning for CSF leak headache, but no etiology has been identified on MRI T or L-spine. MRI Brain not c/w CSF leak headache findings, but this is a poorly sensitive exam so proceeded to CT Myelogram which identified a likely leak in the lumbar spine. Greatly appreciate blood patch performed by Dr. Jobe Igo on 9/6; CSF sampled at the time c/w low pressure (CSF had to be obtained with gentle aspiration and c/w traumatic tap with culture negative to date). EEG confirms she is not having intermittent and undetected seizures.  Caffeine tabs and IVF as well as migraine cocktails (IV tylenol, reglan, bendadryl) have been ineffective.  Repeat blood patch performed 9/11, will leave patient supine for 48 hours.  Checking LFTs today, will resume headache cocktail with IV acetaminophen 1000 mg, IV reglan 5 mg and IV benadryl 12.5 mg q6h if they are normal  Extended  discussion by attending MD at bedside with patient and family.  At this time we have reached the limit of interventions that we can provide at [facility].  Hopefully this blood patch is successful, but if the patient has recurrent symptoms, expertise here is insufficient to resolve her headache and she will need to follow-up at [facility] or another academic center with greater expertise.  In discussion with [facility] due to limited bed availability they have recommended outpatient follow-up.  I have provided the patient with medical release of records form, referral to the [facility] CSF clinic, requested all images from this hospitalization to be power shared, and given the patient instructions to  schedule an appointment.  I have suggested that they go ahead and schedule an appointment now while we continue to observe the patient through Thursday morning.  Even if her symptoms have resolved, follow-up with this clinic will be helpful since she will have risk of recurrent CSF leak in the future. If her symptoms are unresolved a specialized clinic/facility such as [facility] would be required given treatment failure here   Recommendations:  #Headache, suspect low pressure CSF headache secondary to spontaneous leak; unresolved -Check LFTs, ammonia -s/p headache cocktail with IV acetaminophen 1000 mg + Reglan 5 mg and Benadryl 12.5 for nausea/headache q6hr x 4 doses 9/9-9/11; 2 g IV mag -Please minimize opiates due to risk of rebound headache and delirium/sedation -Keep supine for 48 hours -Encourage PO intake -appreciate IVF per primary team -Anticipate discharge Thursday morning.  Hopefully symptoms have resolved but if they have not she will need to have outpatient follow-up  #Fluctuating left-sided weakness, stable -Spot EEG and long-term EEG monitoring negative for seizure activity  Patient seen by NP and then MD.  MD to edit note as needed Cortney E Carron Curie , MSN, AGACNP-BC [facility] See  Amion for schedule and pager information 01/02/2022 9:02 AM  Attending Neurologist's note:  I personally saw this patient, gathering history, performing a full neurologic examination, reviewing relevant labs, personally reviewing relevant imaging (no further imaging since last note), and formulated the assessment and plan, adding the note above for completeness and clarity to accurately reflect my thoughts  Lesleigh Noe MD-PhD [facility] [phone] Available 7 AM to 7 PM, outside these hours please contact Neurologist on call listed on AMION

## 2022-01-03 DIAGNOSIS — I5032 Chronic diastolic (congestive) heart failure: Secondary | ICD-10-CM | POA: Diagnosis not present

## 2022-01-03 DIAGNOSIS — I6930 Unspecified sequelae of cerebral infarction: Secondary | ICD-10-CM | POA: Diagnosis not present

## 2022-01-03 DIAGNOSIS — R519 Headache, unspecified: Secondary | ICD-10-CM | POA: Diagnosis not present

## 2022-01-03 DIAGNOSIS — J9611 Chronic respiratory failure with hypoxia: Secondary | ICD-10-CM | POA: Diagnosis not present

## 2022-01-03 LAB — CBC
HCT: 36.8 % (ref 36.0–46.0)
Hemoglobin: 12 g/dL (ref 12.0–15.0)
MCH: 28.2 pg (ref 26.0–34.0)
MCHC: 32.6 g/dL (ref 30.0–36.0)
MCV: 86.4 fL (ref 80.0–100.0)
Platelets: 293 10*3/uL (ref 150–400)
RBC: 4.26 MIL/uL (ref 3.87–5.11)
RDW: 18.3 % — ABNORMAL HIGH (ref 11.5–15.5)
WBC: 14 10*3/uL — ABNORMAL HIGH (ref 4.0–10.5)
nRBC: 0 % (ref 0.0–0.2)

## 2022-01-03 LAB — BASIC METABOLIC PANEL
Anion gap: 6 (ref 5–15)
BUN: 11 mg/dL (ref 8–23)
CO2: 17 mmol/L — ABNORMAL LOW (ref 22–32)
Calcium: 7.8 mg/dL — ABNORMAL LOW (ref 8.9–10.3)
Chloride: 113 mmol/L — ABNORMAL HIGH (ref 98–111)
Creatinine, Ser: 1.12 mg/dL — ABNORMAL HIGH (ref 0.44–1.00)
GFR, Estimated: 51 mL/min — ABNORMAL LOW (ref >60)
Glucose, Bld: 85 mg/dL (ref 70–99)
Potassium: 4 mmol/L (ref 3.5–5.1)
Sodium: 136 mmol/L (ref 135–145)

## 2022-01-03 LAB — HEPARIN LEVEL (UNFRACTIONATED): Heparin Unfractionated: <0.1 IU/mL — ABNORMAL LOW (ref 0.30–0.70)

## 2022-01-03 LAB — GLUCOSE, CAPILLARY
Glucose-Capillary: 76 mg/dL (ref 70–99)
Glucose-Capillary: 95 mg/dL (ref 70–99)
Glucose-Capillary: 113 mg/dL — ABNORMAL HIGH (ref 70–99)
Glucose-Capillary: 95 mg/dL (ref 70–99)

## 2022-01-03 NOTE — Plan of Care (Signed)
Neurology plan of care  Please see neurology progress note from yesterday for detailed description of findings, workup, and treatment to date as well as final recommendations. Neurology will sign off at this time but feel free to re-engage [place] if additional questions arise.  Bing Neighbors, MD [facility] [phone]  If 7pm- 7am, please page neurology on call as listed in AMION.

## 2022-01-03 NOTE — Progress Notes (Addendum)
[facility]  PROGRESS NOTE  TENISE STETLER UXL:[id number] DOB: 05/30/1945 DOA: 12/22/2021 PCP: Townsend Roger, MD   Brief HPI:    76 year old female with a history of atrial fibrillation on Xarelto, history of CVA with residual right-sided weakness, history of cluster headaches and migraine, CKD stage IIIa, COPD, pulmonary fibrosis, seizure disorder came to ED with bitemporal headache.  This was associated with photophobia.  No vision changes.  No nausea or vomiting.  Patient is on Topamax and Lyrica for history of seizures and headache. In the ED ESR and CRP were elevated.  Neurology was consulted.  MRI brain was negative, CT head was negative. Due to elevated CRP and ESR and bitemporal headache, temporal arteritis was considered.  Temporal artery ultrasound did not show Halo  sign.  Vascular surgery was consulted for possible temporal artery biopsy.  Xarelto is currently on hold in anticipation for biopsy on Wednesday.  Neurology recommended CT myelogram which showed CSF contrast extending into right foraminal L4-L5.  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  Patient underwent blood patch per IR which was unsuccessful as she continued to have symptoms.  She underwent repeat blood patch per IR-plan to lay flat for 48 hours and discharged home on 01/04/2022.   Subjective   Patient seen and examined, s/p blood patch per IR.  Complains of nausea today.   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 -Due to elevated ESR and CRP there was concern for temporal arteritis -Ultrasound of the temporal artery obtained which was negative for halo sign -Patient is received 3 days of IV Solu-Medrol 1 g daily; temporal artery biopsy was planned however it was discontinued as it was felt due to low pressure headache -Neurology has ordered MRI of thoracic and lumbar spine for Tarlov cysts or other cause of low pressure headache -CT myelogram showed possible CSF leak -IR was consulted  for blood patch on 12/27/2021 which was unsuccessful as patient continued to have symptoms -Second patch was placed on 01/01/2022; planned lay flat for next 48 hours -Heparin is on hold  Nausea -Likely medication induced -Patient is currently on Reglan, Benadryl scheduled -Continue Zofran 4 mg IV every 6 hours as needed  Acute bronchitis/pulmonary fibrosis -Patient has history of pulmonary fibrosis, currently not on oxygen -She was coughing up green-colored phlegm also found to have bibasilar crackles -She completed 5 days of course of doxycycline 100 mg p.o. twice daily   History of CVA with residual deficit Chronic.   Chronic respiratory failure with hypoxia (HCC) Stable.   PAF (paroxysmal atrial fibrillation) (HCC) Xarelto on hold    Stage 3a chronic kidney disease (CKD) (HCC) Stable. Creatinine 1.12   (HFpEF) heart failure with preserved ejection fraction (HCC) Stable. Euvolemic.   DNR (do not resuscitate) Stable.   COPD (chronic obstructive pulmonary disease) (HCC) Stable. Prn duonebs.    Medications     aspirin EC  81 mg Oral Daily   metoCLOPramide (REGLAN) injection  5 mg Intravenous Q6H   And   diphenhydrAMINE  12.5 mg Intravenous Q6H   donepezil  10 mg Oral QHS   feeding supplement  237 mL Oral TID BM    HYDROmorphone (DILAUDID) injection  1 mg Intravenous Once   insulin aspart  0-15 Units Subcutaneous TID WC   levothyroxine  88 mcg Oral QAC breakfast   montelukast  10 mg Oral QHS   pantoprazole  40 mg Oral BID   polyethylene glycol  17 g Oral Daily  pregabalin  50 mg Oral BID   topiramate  200 mg Oral BID     Data Reviewed:   CBG:  Recent Labs  Lab 01/02/22 1150 01/02/22 1643 01/02/22 2148 01/03/22 0643 01/03/22 1152  GLUCAP 96 85 95 76 95    SpO2: (!) 89 %    Vitals:   01/03/22 0400 01/03/22 0500 01/03/22 0729 01/03/22 1153  BP: (!) 140/66  137/67 131/66  Pulse: 94  88 87  Resp: _0 Temp: 98.4 F (36.9 C)  98.2 F (36.8  C) 98.2 F (36.8 C)  TempSrc: Oral     SpO2: 100%  100% (!) 89%  Weight:  63.7 kg    Height:          Data Reviewed:  Basic Metabolic Panel: Recent Labs  Lab 01/01/22 0819 01/02/22 0738 01/03/22 0335  NA 141 141 136  K 2.8* 4.2 4.0  CL 115* 117* 113*  CO2 17* 16* 17*  GLUCOSE 92 83 85  BUN _1 CREATININE 1.17* 1.15* 1.12*  CALCIUM 8.0* 8.1* 7.8*  MG  --  2.1  --     CBC: Recent Labs  Lab 12/31/21 0207 01/01/22 0819 01/02/22 1858 01/03/22 0335  WBC 15.6* 12.7* 13.8* 14.0*  HGB 12.9 12.7 14.0 12.0  HCT 39.0 39.9 44.0 36.8  MCV 84.2 86.4 87.3 86.4  PLT 377 351 278 293    LFT Recent Labs  Lab 01/02/22 0735  AST 18  ALT 12  ALKPHOS 101  BILITOT 0.4  PROT 6.3*  ALBUMIN 2.0*     Antibiotics: Anti-infectives (From admission, onward)    Start     Dose/Rate Route Frequency Ordered Stop   12/30/21 1000  doxycycline (VIBRA-TABS) tablet 100 mg        100 mg Oral Every 12 hours 12/30/21 0807 12/30/21 2143   12/25/21 1015  doxycycline (VIBRA-TABS) tablet 100 mg  Status:  Discontinued        100 mg Oral Every 12 hours 12/25/21 0921 12/30/21 0807        DVT prophylaxis: Xarelto on hold  Code Status: DNR  Family Communication: Discussed with husband at bedside   CONSULTS neurology   Objective    Physical Examination:   General-appears in no acute distress Heart-S1-S2, regular, no murmur auscultated Lungs-clear to auscultation bilaterally, no wheezing or crackles auscultated Abdomen-soft, nontender, no organomegaly Extremities-no edema in the lower extremities Neuro-alert, oriented x3, no focal deficit noted  Status is: Inpatient:        [name]   [facility] If 7PM-7AM, please contact night-coverage at [email or web address], Office  [phone]   01/03/2022, 2:02 PM  LOS: 10 days

## 2022-01-04 ENCOUNTER — Telehealth: Payer: Self-pay | Admitting: *Deleted

## 2022-01-04 DIAGNOSIS — I5032 Chronic diastolic (congestive) heart failure: Secondary | ICD-10-CM | POA: Diagnosis not present

## 2022-01-04 DIAGNOSIS — R519 Headache, unspecified: Secondary | ICD-10-CM | POA: Diagnosis not present

## 2022-01-04 DIAGNOSIS — J449 Chronic obstructive pulmonary disease, unspecified: Secondary | ICD-10-CM | POA: Diagnosis not present

## 2022-01-04 DIAGNOSIS — I480 Paroxysmal atrial fibrillation: Secondary | ICD-10-CM | POA: Diagnosis not present

## 2022-01-04 LAB — CBC
HCT: 40.4 % (ref 36.0–46.0)
Hemoglobin: 13 g/dL (ref 12.0–15.0)
MCH: 27.7 pg (ref 26.0–34.0)
MCHC: 32.2 g/dL (ref 30.0–36.0)
MCV: 86 fL (ref 80.0–100.0)
Platelets: 306 10*3/uL (ref 150–400)
RBC: 4.7 MIL/uL (ref 3.87–5.11)
RDW: 18.8 % — ABNORMAL HIGH (ref 11.5–15.5)
WBC: 14.3 10*3/uL — ABNORMAL HIGH (ref 4.0–10.5)
nRBC: 0 % (ref 0.0–0.2)

## 2022-01-04 LAB — GLUCOSE, CAPILLARY
Glucose-Capillary: 93 mg/dL (ref 70–99)
Glucose-Capillary: 139 mg/dL — ABNORMAL HIGH (ref 70–99)

## 2022-01-04 MED ORDER — RIVAROXABAN 15 MG PO TABS
15.0000 mg | ORAL_TABLET | Freq: Every day | ORAL | Status: DC
  Administered 2022-01-04: 15 mg via ORAL
  Filled 2022-01-04: qty 1

## 2022-01-04 MED ORDER — OXYCODONE HCL 5 MG PO TABS: 5.0000 mg | ORAL_TABLET | ORAL | 0 refills | Status: DC | PRN

## 2022-01-04 MED ORDER — ONDANSETRON HCL 4 MG PO TABS: 4.0000 mg | ORAL_TABLET | Freq: Three times a day (TID) | ORAL | 0 refills | Status: DC | PRN

## 2022-01-04 NOTE — Progress Notes (Signed)
PT Cancellation Note  Patient Details Name: Jenny Kelly MRN: [id number] DOB: Jun 14, 1945   Cancelled Treatment:    Reason Eval/Treat Not Completed: Other (comment). Imminent DC order written at 1307. Attempted to see pt at 1410. Pt already dc'd home.   Angelina Ok Maycok 01/04/2022, 2:10 PM Skip Mayer PT Acute [employer] [phone]

## 2022-01-04 NOTE — Discharge Summary (Addendum)
Physician Discharge Summary   Patient: Jenny Kelly MRN: [id number] DOB: 1945-06-25  Admit date:     12/22/2021  Discharge date: 01/04/22  Discharge Physician: [name]   PCP: Townsend Roger, MD   Recommendations at discharge:   Follow-up with [facility] as outpatient, referral form has been sent by neurology For PCP in 1 week  Discharge Diagnoses: Principal Problem:   Headache Active Problems:   History of CVA with residual deficit   Chronic respiratory failure with hypoxia (HCC)   PAF (paroxysmal atrial fibrillation) (HCC)   Stage 3a chronic kidney disease (CKD) ([place])   Pulmonary fibrosis (HCC)   COPD (chronic obstructive pulmonary disease) ([place])   DNR (do not resuscitate)   (HFpEF) heart failure with preserved ejection fraction ([place])   Temporal arteritis ([place])  Resolved Problems:   * No resolved hospital problems. *  Hospital Course:  76 year old female with a history of atrial fibrillation on Xarelto, history of CVA with residual right-sided weakness, history of cluster headaches and migraine, CKD stage IIIa, COPD, pulmonary fibrosis, seizure disorder came to ED with bitemporal headache.  This was associated with photophobia.  No vision changes.  No nausea or vomiting.  Patient is on Topamax and Lyrica for history of seizures and headache. In the ED ESR and CRP were elevated.  Neurology was consulted.  MRI brain was negative, CT head was negative. Due to elevated CRP and ESR and bitemporal headache, temporal arteritis was considered.  Temporal artery ultrasound did not show Halo  sign.  Vascular surgery was consulted for possible temporal artery biopsy.  Xarelto is currently on hold in anticipation for biopsy on Wednesday.  Neurology recommended CT myelogram which showed CSF contrast extending into right foraminal L4-L5.  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  Patient underwent blood patch per IR which was unsuccessful as she continued to have symptoms.  She  underwent repeat blood patch per IR-plan to lay flat for 48 hours and discharged home on 01/04/2022.   Assessment and Plan:  * Headache Pt with long hx of cluster and migraine headache. Although pt states current headaches does not feel like her migraines. Headache did improve with percocet. Pt's husband noted that pt was able to fall asleep despite pt stating her headache was 10/10. Pt states her headache has returned and requesting another percocet. Neurology also consulted to evaluate for temporal arteritis. Vascular U/S ordered for temporal artery.  Even if she has possible temporal arteritis on U/S, without any vision loss or vision changes, pt would not need hospitalization for high dose IV solumedrol. If she were in the outpatient setting, she would be prescribed po prednisone.  At this point, pt does not warrant hospital admission.  Temporal artery U/S has been ordered by neurology. Defer to neurology for plan.  **update. Pt seen by neurology. Now they want pt admitted for workup.  History of CVA with residual deficit Chronic.  Chronic respiratory failure with hypoxia (HCC) Stable.  PAF (paroxysmal atrial fibrillation) (HCC) Remains on xarelto  Stage 3a chronic kidney disease (CKD) (HCC) Stable.  (HFpEF) heart failure with preserved ejection fraction (HCC) Stable. Euvolemic.  DNR (do not resuscitate) Stable.  COPD (chronic obstructive pulmonary disease) (HCC) Stable. Prn duonebs.  Pulmonary fibrosis (HCC) Chronic. Stable.  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 -Due to elevated ESR and CRP there was concern for temporal arteritis -Ultrasound of the temporal artery obtained which was negative for halo sign -Patient is received 3 days of IV  Solu-Medrol 1 g daily; temporal artery biopsy was planned however it was discontinued as it was felt due to low pressure headache -Neurology has ordered MRI of thoracic and lumbar spine for Tarlov cysts or other cause of low pressure  headache -CT myelogram showed possible CSF leak -IR was consulted for blood patch on 12/27/2021 which was unsuccessful as patient continued to have symptoms -Second blood patch was placed on 01/01/2022; -Patient to follow-up with [facility] as outpatient, referral form has been sent by neurology    Nausea -Likely medication induced -We will discharge on Zofran 4 mg p.o. every 8 hours as needed for nausea and vomiting   Acute bronchitis/pulmonary fibrosis -Patient has history of pulmonary fibrosis, currently not on oxygen -She was coughing up green-colored phlegm also found to have bibasilar crackles -She completed 5 days of course of doxycycline 100 mg p.o. twice daily    History of CVA with residual deficit Chronic.   Chronic respiratory failure with hypoxia (HCC) Stable.   PAF (paroxysmal atrial fibrillation) (HCC) Xarelto has been restarted after discussion with IR   Stage 3a chronic kidney disease (CKD) ([place]) Stable. Creatinine 1.12   (HFpEF) heart failure with preserved ejection fraction (HCC) Stable. Euvolemic.   DNR (do not resuscitate) Stable.   COPD (chronic obstructive pulmonary disease) (HCC) Stable. Prn duonebs.       Consultants: Neurology Procedures performed:  Disposition: Home Diet recommendation:  Home DISCHARGE MEDICATION: Allergies as of 01/04/2022       Reactions   Ciprofloxacin Hives   Ask patient   Prochlorperazine Edisylate Other (See Comments)   Cant swallow Muscle cramping   Morphine Other (See Comments)   Confusion   Prednisone Other (See Comments)   unknown   Reduced Iso-alpha Acids Complex Other (See Comments)   unknown   Statins Other (See Comments)   Muscle weakness   Sulfa Antibiotics Swelling   Azithromycin Rash   Latex Rash        Medication List     STOP taking these medications    guaiFENesin 600 MG 12 hr tablet Commonly known as: MUCINEX   Percocet 5-325 MG tablet Generic drug:  oxyCODONE-acetaminophen       TAKE these medications    acetaminophen 500 MG tablet Commonly known as: TYLENOL Take 500 mg by mouth every 6 (six) hours as needed for mild pain or moderate pain.   aspirin EC 81 MG tablet Take 1 tablet (81 mg total) by mouth daily. Swallow whole.   budesonide 0.5 MG/2ML nebulizer solution Commonly known as: PULMICORT Take 2 mLs (0.5 mg total) by nebulization in the morning and at bedtime.   cetirizine 10 MG tablet Commonly known as: ZYRTEC Take 10 mg by mouth daily.   docusate sodium 100 MG capsule Commonly known as: COLACE Take 100 mg by mouth daily.   donepezil 10 MG tablet Commonly known as: ARICEPT Take 1 tablet (10 mg total) by mouth at bedtime.   Fish Oil 1200 MG Caps Take 1,200 mg by mouth 2 (two) times daily.   guaiFENesin-dextromethorphan 100-10 MG/5ML syrup Commonly known as: ROBITUSSIN DM Take 10 mLs by mouth every 4 (four) hours as needed for cough.   levothyroxine 88 MCG tablet Commonly known as: SYNTHROID Take 88 mcg by mouth daily before breakfast.   montelukast 10 MG tablet Commonly known as: SINGULAIR Take 1 tablet (10 mg total) by mouth at bedtime.   multivitamin with minerals Tabs tablet Take 1 tablet by mouth daily.   omeprazole 40 MG  capsule Commonly known as: PRILOSEC Take 1 capsule (40 mg total) by mouth daily. What changed: when to take this   ondansetron 4 MG tablet Commonly known as: Zofran Take 1 tablet (4 mg total) by mouth every 8 (eight) hours as needed for nausea or vomiting.   oxyCODONE 5 MG immediate release tablet Commonly known as: Oxy IR/ROXICODONE Take 1 tablet (5 mg total) by mouth every 4 (four) hours as needed for breakthrough pain.   pravastatin 80 MG tablet Commonly known as: PRAVACHOL Take 1 tablet (80 mg total) by mouth every evening.   pregabalin 50 MG capsule Commonly known as: LYRICA TAKE 1 CAPSULE BY MOUTH 2 TIMES DAILY.   PROBIOTIC PO Take 15 Billion Cells by mouth  daily.   Rivaroxaban 15 MG Tabs tablet Commonly known as: XARELTO Take 1 tablet (15 mg total) by mouth daily.   topiramate 200 MG tablet Commonly known as: TOPAMAX Take 1 tablet (200 mg total) by mouth 2 (two) times daily.   URINARY HEALTH/CRANBERRY PO Take 1 tablet by mouth 2 (two) times daily.        Follow-up Information     Townsend Roger, MD Follow up in 1 week(s).   Specialty: Internal Medicine Contact information: [place] [address] [phone]                Discharge Exam: Danley Danker Weights   01/02/22 0500 01/03/22 0500 01/04/22 3762  Weight: 64.2 kg 63.7 kg 60.5 kg   General-appears in no acute distress Heart-S1-S2, regular, no murmur auscultated Lungs-clear to auscultation bilaterally, no wheezing or crackles auscultated Abdomen-soft, nontender, no organomegaly Extremities-no edema in the lower extremities Neuro-alert, oriented x3, no focal deficit noted  Condition at discharge: good  The results of significant diagnostics from this hospitalization (including imaging, microbiology, ancillary and laboratory) are listed below for reference.   Imaging Studies: IR Fluoro Guide Ndl Plmt / BX  Result Date: 01/01/2022 CLINICAL DATA:  Positional headaches. Possible L4-L5 CSF leak. Mild improvement for a day after L4-L5 interlaminar and right transforaminal blood patch 5 days ago. Repeat blood patch requested. FLUOROSCOPY TIME:  Radiation Exposure Index (as provided by the fluoroscopic device): 12.8 mGy Kerma PROCEDURE: LUMBAR EPIDURAL BLOOD PATCH INJECTION After a thorough discussion of risks and benefits of the procedure, written and verbal consent was obtained. Prior to the procedure, 20 ml of the patient's blood was harvested using stringent sterile technique. An interlaminar approach was performed at L4-L5 on the right. Under stringent sterile technique, overlying skin was cleansed with betadine soap and anesthetized with 1% lidocaine  without epinephrine. A 3.5 inch 20 gauge needle was advanced using loss-of-resistance technique. DIAGNOSTIC EPIDURAL INJECTION Injection of Omnipaque 180 shows a good epidural pattern with spread above and below the level of needle placement, primarily in the midline. No vascular or subarachnoid opacification is seen. THERAPEUTIC EPIDURAL INJECTION 15 ml of the patient's blood was injected into the epidural space. IMPRESSION: Technically successful repeat interlaminar lumbar blood patch at L4-L5. Electronically Signed   By: Titus Dubin M.D.   On: 01/01/2022 17:03   Overnight EEG with video  Result Date: 12/30/2021 Lora Havens, MD     12/30/2021  3:42 PM Patient Name: TYJA GORTNEY MRN: [id number] Epilepsy Attending: Lora Havens Referring Physician/Provider: Lorenza Chick, MD Duration: 12/29/2021 1322 to 12/30/2021 1322  Patient history: 76 yo WF with hx of afib, on xarelto, hx of CVA with residual right sided weakness, hx of cluster  HA and migraines, CKD stage 3a, COPD, pulmonary fibrosis, seizure disorder, presented to ER with bitemporal headache and has intermittent right sided weakness. EEG to evaluate for seizure  Level of alertness: lethargic  AEDs during EEG study: TPM, PGB  Technical aspects: This EEG study was done with scalp electrodes positioned according to the 10-20 International system of electrode placement. Electrical activity was reviewed with band pass filter of 1-_0 , sensitivity of 7 uV/mm, display speed of 44m/sec with a _1  notched filter applied as appropriate. EEG data were recorded continuously and digitally stored.  Video monitoring was available and reviewed as appropriate.  Description: No posterior dominant rhythm was seen. EEG showed continuous generalized 3 to 6 Hz theta-delta slowing, at times with triphasic morphology.  Hyperventilation and photic stimulation were not performed.    ABNORMALITY - Continuous slow, generalized  IMPRESSION: This study is suggestive of  moderate diffuse encephalopathy, nonspecific etiology. No seizures or epileptiform discharges were seen were seen throughout the recording. Priyanka O Yadav   IR Fluoro Guide Ndl Plmt / BX  Result Date: 12/28/2021 CLINICAL DATA:  Positional headaches. Probable L4-5 CSF leak. Abnormal myelogram. EXAM: LUMBAR EPIDUROGRAM AND BLOOD PATCH FLUOROSCOPY: Radiation Exposure Index (as provided by the fluoroscopic device): Dose area product COMPARISON:  None Available. TECHNIQUE: An appropriate skin entry site was determined. Operator donned sterile gloves and mask. Skin site was marked, prepped with Betadine, and draped in usual sterile fashion, and infiltrated locally with 1% lidocaine. A 20 gauge spinal needle was advanced into the lumbar posterior epidural space near the midline using a right interlaminar approach at L4-5using loss of resistance technique. Diagnostic injection of 234mOmnipaque 180 demonstrates good epidural spread above and below the needle tip and crossing the midline. No intravascular or subarachnoid component. 10 ml of autologous blood were then administered as lumbar epidural blood patch. I then directed a 22 gauge spinal needle into the right foramen at L4-5 to treat the external aspect of the foramen. A confirm placement with 1 mL Omnipaque 180. 5 ml of autologous blood were then administered as lumbar epidural blood patch. No immediate complication. IMPRESSION: 1. Technically successful lumbar epidural blood patch under fluoroscopy. Both interlaminar and transforaminal approaches were taken. Electronically Signed   By: ChSan Morelle.D.   On: 12/28/2021 07:47   DG Myelogram 2+ Regions  Result Date: 12/27/2021 CLINICAL DATA:  Positional headaches. 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 FLUOROSCOPY: Radiation Exposure Index (as provided by the fluoroscopic device): Dose area product 109.57 uGy*m2 PROCEDURE: LUMBAR PUNCTURE FOR CERVICAL LUMBAR AND THORACIC MYELOGRAM CERVICAL  AND LUMBAR AND THORACIC MYELOGRAM CT CERVICAL MYELOGRAM CT LUMBAR MYELOGRAM CT THORACIC MYELOGRAM After thorough discussion of risks and benefits of the procedure including bleeding, infection, injury to nerves, blood vessels, adjacent structures as well as headache and CSF leak, written and oral informed consent was obtained. Consent was obtained by KeAscencion DikePA Patient was positioned prone on the fluoroscopy table. Local anesthesia was provided with 1% lidocaine without epinephrine after prepped and draped in the usual sterile fashion. Puncture was performed at L2-3 using a 3 1/2 inch 22-gauge spinal needle via right paramedian approach. Using a single pass through the dura, the needle was placed within the thecal sac, with return of clear CSF. 10 mL Isovue M-300 was injected into the thecal sac, with normal opacification of the nerve roots and cauda equina consistent with free flow within the subarachnoid space. The patient was then moved to the trendelenburg position and contrast  flowed into the Thoracic and Cervical spine regions. I personally supervised the lumbar puncture performed by Ascencion Dike. I personally administered the intrathecal contrast. I also personally supervised acquisition of the myelogram images. TECHNIQUE: Contiguous axial images were obtained through the Cervical, Thoracic, and Lumbar spine after the intrathecal infusion of infusion. Coronal and sagittal reconstructions were obtained of the axial image sets. FINDINGS: MYELOGRAM FINDINGS: We were unable to adequately position the patient for myelogram images. Contrast is not well seen below the L4-5 level. No extra canalicular contrast is visualized. Contrast was followed into the cervical spine. CT CERVICAL MYELOGRAM FINDINGS: Slight degenerative anterolisthesis is present at C2-3 and C3-4. Straightening of the normal cervical lordosis is present. Atherosclerotic calcifications are present at the carotid bifurcations, right greater  than left. Soft tissues the neck are otherwise unremarkable. C2-3: Asymmetric right-sided facet hypertrophy is present. Mild right foraminal narrowing is present. Soft disc protrusion partially effaces the ventral CSF. C3-4: A shallow central disc protrusion partially effaces the ventral CSF. Advanced facet hypertrophy is present. Moderate left foraminal stenosis is present. C4-5: A rightward disc osteophyte complex is present. There is some impression on the right side of the cord. C5-6: A broad-based disc osteophyte complex is present. This partially effaces the ventral CSF. Moderate foraminal stenosis is present bilaterally. A broad-based disc osteophyte complex is present. The canal is narrowed to 9 mm. Moderate to severe foraminal stenosis is present bilaterally. C7-T1: Moderate facet hypertrophy is present bilaterally. No significant stenosis is present. No significant CSF contrast is present in the foramina or beyond to suggest a CSF leak. CT LUMBAR MYELOGRAM FINDINGS: Comparison made with MRI of lumbar spine without contrast 12/25/2021. Five non rib-bearing lumbar type vertebral bodies are present. Conus terminates at L1. Vertebral body heights are normal. No significant listhesis is present. Atherosclerotic calcifications are present in the aorta without aneurysm. Mild rightward curvature is centered at the thoracolumbar junction. Asymmetric leftward curvature is present at L4-5. L1-2: Negative. L2-3: Mild disc bulging facet hypertrophy is present. No significant stenosis were CSF leak is present. L3-4: A broad-based disc protrusion is present. Moderate facet hypertrophy is noted. Moderate foraminal narrowing is worse left than right. L4-5: The rightward disc protrusion is again noted. Asymmetric right sided facet hypertrophy is present. CSF contrast can be seen into the right foramen and beyond the right foramen, suggesting CSF leak. No vascular contrast is present. L5-S1: Moderate facet hypertrophy is  present. Right laminectomy noted. Mild foraminal stenosis is worse right than left. No significant extra canalicular CSF contrast is present. CT THORACIC MYELOGRAM FINDINGS: Comparison made with MRI of the thoracic spine 12/25/2021. Twelve rib-bearing thoracic type vertebral bodies are present. Cord morphology is within normal limits. Alignment is anatomic. Thoracic kyphosis is preserved. Extensive fibrotic changes are again seen within both lungs. More consolidated airspace disease is present in the medial right lower lobe. Airways are patent. Atherosclerotic calcifications are present in the aortic arch and descending thoracic aorta. No aneurysm is present. 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 Small amount of contrast extends into the perineural root sleeve cysts bilaterally at T5-6 and T6-7. No other significant extra canalicular CSF contrast is present in the thoracic spine. IMPRESSION: 1. Broad-based disc protrusion at L4-5 with asymmetric right-sided facet hypertrophy resulting in moderate foraminal stenosis bilaterally. 2. CSF contrast extends into the right foramen at L4-5 and beyond the right foramen, suggesting CSF leak. 3. 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 These can be associated with CSF leaks without definite leak. 4. Right laminectomy at L5-S1 with mild foraminal stenosis, worse right than left. 5. Multilevel spondylosis of the cervical spine with mild central and moderate to severe foraminal stenosis at C5-6. 6. Mild right foraminal narrowing at C2-3 and C3-4. 7. Moderate left foraminal stenosis at C3-4. 8. Moderate foraminal stenosis at L3-4 is worse left than right. 9. Pulmonary fibrosis. 10. More consolidated airspace disease in the right lower lobe is concerning for pneumonia. 11. Aortic Atherosclerosis (ICD10-I70.0). Electronically Signed   By: San Morelle  M.D.   On: 12/27/2021 16:30   CT CERVICAL SPINE W CONTRAST  Result Date: 12/27/2021 CLINICAL DATA:  Positional headaches. 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 FLUOROSCOPY: Radiation Exposure Index (as provided by the fluoroscopic device): Dose area product 109.57 uGy*m2 PROCEDURE: LUMBAR PUNCTURE FOR CERVICAL LUMBAR AND THORACIC MYELOGRAM CERVICAL AND LUMBAR AND THORACIC MYELOGRAM CT CERVICAL MYELOGRAM CT LUMBAR MYELOGRAM CT THORACIC MYELOGRAM After thorough discussion of risks and benefits of the procedure including bleeding, infection, injury to nerves, blood vessels, adjacent structures as well as headache and CSF leak, written and oral informed consent was obtained. Consent was obtained by Ascencion Dike, PA Patient was positioned prone on the fluoroscopy table. Local anesthesia was provided with 1% lidocaine without epinephrine after prepped and draped in the usual sterile fashion. Puncture was performed at L2-3 using a 3 1/2 inch 22-gauge spinal needle via right paramedian approach. Using a single pass through the dura, the needle was placed within the thecal sac, with return of clear CSF. 10 mL Isovue M-300 was injected into the thecal sac, with normal opacification of the nerve roots and cauda equina consistent with free flow within the subarachnoid space. The patient was then moved to the trendelenburg position and contrast flowed into the Thoracic and Cervical spine regions. I personally supervised the lumbar puncture performed by Ascencion Dike. I personally administered the intrathecal contrast. I also personally supervised acquisition of the myelogram images. TECHNIQUE: Contiguous axial images were obtained through the Cervical, Thoracic, and Lumbar spine after the intrathecal infusion of infusion. Coronal and sagittal reconstructions were obtained of the axial image sets. FINDINGS: MYELOGRAM FINDINGS: We were unable to adequately position the patient for myelogram images. Contrast is not  well seen below the L4-5 level. No extra canalicular contrast is visualized. Contrast was followed into the cervical spine. CT CERVICAL MYELOGRAM FINDINGS: Slight degenerative anterolisthesis is present at C2-3 and C3-4. Straightening of the normal cervical lordosis is present. Atherosclerotic calcifications are present at the carotid bifurcations, right greater than left. Soft tissues the neck are otherwise unremarkable. C2-3: Asymmetric right-sided facet hypertrophy is present. Mild right foraminal narrowing is present. Soft disc protrusion partially effaces the ventral CSF. C3-4: A shallow central disc protrusion partially effaces the ventral CSF. Advanced facet hypertrophy is present. Moderate left foraminal stenosis is present. C4-5: A rightward disc osteophyte complex is present. There is some impression on the right side of the cord. C5-6: A broad-based disc osteophyte complex is present. This partially effaces the ventral CSF. Moderate foraminal stenosis is present bilaterally. A broad-based disc osteophyte complex is present. The canal is narrowed to 9 mm. Moderate to severe foraminal stenosis is present bilaterally. C7-T1: Moderate facet hypertrophy is present bilaterally. No significant stenosis is present. No significant CSF contrast is present in the foramina or beyond to suggest a CSF leak. CT LUMBAR MYELOGRAM FINDINGS: Comparison  made with MRI of lumbar spine without contrast 12/25/2021. Five non rib-bearing lumbar type vertebral bodies are present. Conus terminates at L1. Vertebral body heights are normal. No significant listhesis is present. Atherosclerotic calcifications are present in the aorta without aneurysm. Mild rightward curvature is centered at the thoracolumbar junction. Asymmetric leftward curvature is present at L4-5. L1-2: Negative. L2-3: Mild disc bulging facet hypertrophy is present. No significant stenosis were CSF leak is present. L3-4: A broad-based disc protrusion is present.  Moderate facet hypertrophy is noted. Moderate foraminal narrowing is worse left than right. L4-5: The rightward disc protrusion is again noted. Asymmetric right sided facet hypertrophy is present. CSF contrast can be seen into the right foramen and beyond the right foramen, suggesting CSF leak. No vascular contrast is present. L5-S1: Moderate facet hypertrophy is present. Right laminectomy noted. Mild foraminal stenosis is worse right than left. No significant extra canalicular CSF contrast is present. CT THORACIC MYELOGRAM FINDINGS: Comparison made with MRI of the thoracic spine 12/25/2021. Twelve rib-bearing thoracic type vertebral bodies are present. Cord morphology is within normal limits. Alignment is anatomic. Thoracic kyphosis is preserved. Extensive fibrotic changes are again seen within both lungs. More consolidated airspace disease is present in the medial right lower lobe. Airways are patent. Atherosclerotic calcifications are present in the aortic arch and descending thoracic aorta. No aneurysm is present. 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 Small amount of contrast extends into the perineural root sleeve cysts bilaterally at T5-6 and T6-7. No other significant extra canalicular CSF contrast is present in the thoracic spine. IMPRESSION: 1. Broad-based disc protrusion at L4-5 with asymmetric right-sided facet hypertrophy resulting in moderate foraminal stenosis bilaterally. 2. CSF contrast extends into the right foramen at L4-5 and beyond the right foramen, suggesting CSF leak. 3. 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 These can be associated with CSF leaks without definite leak. 4. Right laminectomy at L5-S1 with mild foraminal stenosis, worse right than left. 5. Multilevel spondylosis of the cervical spine with mild central and moderate to severe foraminal stenosis at C5-6. 6.  Mild right foraminal narrowing at C2-3 and C3-4. 7. Moderate left foraminal stenosis at C3-4. 8. Moderate foraminal stenosis at L3-4 is worse left than right. 9. Pulmonary fibrosis. 10. More consolidated airspace disease in the right lower lobe is concerning for pneumonia. 11. Aortic Atherosclerosis (ICD10-I70.0). Electronically Signed   By: San Morelle M.D.   On: 12/27/2021 16:30   CT THORACIC SPINE W CONTRAST  Result Date: 12/27/2021 CLINICAL DATA:  Positional headaches. 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 FLUOROSCOPY: Radiation Exposure Index (as provided by the fluoroscopic device): Dose area product 109.57 uGy*m2 PROCEDURE: LUMBAR PUNCTURE FOR CERVICAL LUMBAR AND THORACIC MYELOGRAM CERVICAL AND LUMBAR AND THORACIC MYELOGRAM CT CERVICAL MYELOGRAM CT LUMBAR MYELOGRAM CT THORACIC MYELOGRAM After thorough discussion of risks and benefits of the procedure including bleeding, infection, injury to nerves, blood vessels, adjacent structures as well as headache and CSF leak, written and oral informed consent was obtained. Consent was obtained by Ascencion Dike, PA Patient was positioned prone on the fluoroscopy table. Local anesthesia was provided with 1% lidocaine without epinephrine after prepped and draped in the usual sterile fashion. Puncture was performed at L2-3 using a 3 1/2 inch 22-gauge spinal needle via right paramedian approach. Using a single pass through the dura, the needle was placed within the  thecal sac, with return of clear CSF. 10 mL Isovue M-300 was injected into the thecal sac, with normal opacification of the nerve roots and cauda equina consistent with free flow within the subarachnoid space. The patient was then moved to the trendelenburg position and contrast flowed into the Thoracic and Cervical spine regions. I personally supervised the lumbar puncture performed by Ascencion Dike. I personally administered the intrathecal contrast. I also personally supervised  acquisition of the myelogram images. TECHNIQUE: Contiguous axial images were obtained through the Cervical, Thoracic, and Lumbar spine after the intrathecal infusion of infusion. Coronal and sagittal reconstructions were obtained of the axial image sets. FINDINGS: MYELOGRAM FINDINGS: We were unable to adequately position the patient for myelogram images. Contrast is not well seen below the L4-5 level. No extra canalicular contrast is visualized. Contrast was followed into the cervical spine. CT CERVICAL MYELOGRAM FINDINGS: Slight degenerative anterolisthesis is present at C2-3 and C3-4. Straightening of the normal cervical lordosis is present. Atherosclerotic calcifications are present at the carotid bifurcations, right greater than left. Soft tissues the neck are otherwise unremarkable. C2-3: Asymmetric right-sided facet hypertrophy is present. Mild right foraminal narrowing is present. Soft disc protrusion partially effaces the ventral CSF. C3-4: A shallow central disc protrusion partially effaces the ventral CSF. Advanced facet hypertrophy is present. Moderate left foraminal stenosis is present. C4-5: A rightward disc osteophyte complex is present. There is some impression on the right side of the cord. C5-6: A broad-based disc osteophyte complex is present. This partially effaces the ventral CSF. Moderate foraminal stenosis is present bilaterally. A broad-based disc osteophyte complex is present. The canal is narrowed to 9 mm. Moderate to severe foraminal stenosis is present bilaterally. C7-T1: Moderate facet hypertrophy is present bilaterally. No significant stenosis is present. No significant CSF contrast is present in the foramina or beyond to suggest a CSF leak. CT LUMBAR MYELOGRAM FINDINGS: Comparison made with MRI of lumbar spine without contrast 12/25/2021. Five non rib-bearing lumbar type vertebral bodies are present. Conus terminates at L1. Vertebral body heights are normal. No significant listhesis is  present. Atherosclerotic calcifications are present in the aorta without aneurysm. Mild rightward curvature is centered at the thoracolumbar junction. Asymmetric leftward curvature is present at L4-5. L1-2: Negative. L2-3: Mild disc bulging facet hypertrophy is present. No significant stenosis were CSF leak is present. L3-4: A broad-based disc protrusion is present. Moderate facet hypertrophy is noted. Moderate foraminal narrowing is worse left than right. L4-5: The rightward disc protrusion is again noted. Asymmetric right sided facet hypertrophy is present. CSF contrast can be seen into the right foramen and beyond the right foramen, suggesting CSF leak. No vascular contrast is present. L5-S1: Moderate facet hypertrophy is present. Right laminectomy noted. Mild foraminal stenosis is worse right than left. No significant extra canalicular CSF contrast is present. CT THORACIC MYELOGRAM FINDINGS: Comparison made with MRI of the thoracic spine 12/25/2021. Twelve rib-bearing thoracic type vertebral bodies are present. Cord morphology is within normal limits. Alignment is anatomic. Thoracic kyphosis is preserved. Extensive fibrotic changes are again seen within both lungs. More consolidated airspace disease is present in the medial right lower lobe. Airways are patent. Atherosclerotic calcifications are present in the aortic arch and descending thoracic aorta. No aneurysm is present. 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 Small amount of contrast extends into the perineural root sleeve cysts bilaterally at T5-6 and T6-7. No other significant extra canalicular CSF contrast is present in the thoracic spine.  IMPRESSION: 1. Broad-based disc protrusion at L4-5 with asymmetric right-sided facet hypertrophy resulting in moderate foraminal stenosis bilaterally. 2. CSF contrast extends into the right foramen at L4-5 and beyond the right foramen, suggesting CSF leak. 3. Perineural root  sleeve cyst on the right at T9-10 and filling of the perineural root sleeves bilaterally at T5-6 and T6-7. These can be associated with CSF leaks without definite leak. 4. Right laminectomy at L5-S1 with mild foraminal stenosis, worse right than left. 5. Multilevel spondylosis of the cervical spine with mild central and moderate to severe foraminal stenosis at C5-6. 6. Mild right foraminal narrowing at C2-3 and C3-4. 7. Moderate left foraminal stenosis at C3-4. 8. Moderate foraminal stenosis at L3-4 is worse left than right. 9. Pulmonary fibrosis. 10. More consolidated airspace disease in the right lower lobe is concerning for pneumonia. 11. Aortic Atherosclerosis (ICD10-I70.0). Electronically Signed   By: San Morelle M.D.   On: 12/27/2021 16:30   CT LUMBAR SPINE W CONTRAST  Result Date: 12/27/2021 CLINICAL DATA:  Positional headaches. 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 FLUOROSCOPY: Radiation Exposure Index (as provided by the fluoroscopic device): Dose area product 109.57 uGy*m2 PROCEDURE: LUMBAR PUNCTURE FOR CERVICAL LUMBAR AND THORACIC MYELOGRAM CERVICAL AND LUMBAR AND THORACIC MYELOGRAM CT CERVICAL MYELOGRAM CT LUMBAR MYELOGRAM CT THORACIC MYELOGRAM After thorough discussion of risks and benefits of the procedure including bleeding, infection, injury to nerves, blood vessels, adjacent structures as well as headache and CSF leak, written and oral informed consent was obtained. Consent was obtained by Ascencion Dike, PA Patient was positioned prone on the fluoroscopy table. Local anesthesia was provided with 1% lidocaine without epinephrine after prepped and draped in the usual sterile fashion. Puncture was performed at L2-3 using a 3 1/2 inch 22-gauge spinal needle via right paramedian approach. Using a single pass through the dura, the needle was placed within the thecal sac, with return of clear CSF. 10 mL Isovue M-300 was injected into the thecal sac, with normal opacification of the  nerve roots and cauda equina consistent with free flow within the subarachnoid space. The patient was then moved to the trendelenburg position and contrast flowed into the Thoracic and Cervical spine regions. I personally supervised the lumbar puncture performed by Ascencion Dike. I personally administered the intrathecal contrast. I also personally supervised acquisition of the myelogram images. TECHNIQUE: Contiguous axial images were obtained through the Cervical, Thoracic, and Lumbar spine after the intrathecal infusion of infusion. Coronal and sagittal reconstructions were obtained of the axial image sets. FINDINGS: MYELOGRAM FINDINGS: We were unable to adequately position the patient for myelogram images. Contrast is not well seen below the L4-5 level. No extra canalicular contrast is visualized. Contrast was followed into the cervical spine. CT CERVICAL MYELOGRAM FINDINGS: Slight degenerative anterolisthesis is present at C2-3 and C3-4. Straightening of the normal cervical lordosis is present. Atherosclerotic calcifications are present at the carotid bifurcations, right greater than left. Soft tissues the neck are otherwise unremarkable. C2-3: Asymmetric right-sided facet hypertrophy is present. Mild right foraminal narrowing is present. Soft disc protrusion partially effaces the ventral CSF. C3-4: A shallow central disc protrusion partially effaces the ventral CSF. Advanced facet hypertrophy is present. Moderate left foraminal stenosis is present. C4-5: A rightward disc osteophyte complex is present. There is some impression on the right side of the cord. C5-6: A broad-based disc osteophyte complex is present. This partially effaces the ventral CSF. Moderate foraminal stenosis is present bilaterally. A broad-based disc osteophyte complex is present. The canal  is narrowed to 9 mm. Moderate to severe foraminal stenosis is present bilaterally. C7-T1: Moderate facet hypertrophy is present bilaterally. No  significant stenosis is present. No significant CSF contrast is present in the foramina or beyond to suggest a CSF leak. CT LUMBAR MYELOGRAM FINDINGS: Comparison made with MRI of lumbar spine without contrast 12/25/2021. Five non rib-bearing lumbar type vertebral bodies are present. Conus terminates at L1. Vertebral body heights are normal. No significant listhesis is present. Atherosclerotic calcifications are present in the aorta without aneurysm. Mild rightward curvature is centered at the thoracolumbar junction. Asymmetric leftward curvature is present at L4-5. L1-2: Negative. L2-3: Mild disc bulging facet hypertrophy is present. No significant stenosis were CSF leak is present. L3-4: A broad-based disc protrusion is present. Moderate facet hypertrophy is noted. Moderate foraminal narrowing is worse left than right. L4-5: The rightward disc protrusion is again noted. Asymmetric right sided facet hypertrophy is present. CSF contrast can be seen into the right foramen and beyond the right foramen, suggesting CSF leak. No vascular contrast is present. L5-S1: Moderate facet hypertrophy is present. Right laminectomy noted. Mild foraminal stenosis is worse right than left. No significant extra canalicular CSF contrast is present. CT THORACIC MYELOGRAM FINDINGS: Comparison made with MRI of the thoracic spine 12/25/2021. Twelve rib-bearing thoracic type vertebral bodies are present. Cord morphology is within normal limits. Alignment is anatomic. Thoracic kyphosis is preserved. Extensive fibrotic changes are again seen within both lungs. More consolidated airspace disease is present in the medial right lower lobe. Airways are patent. Atherosclerotic calcifications are present in the aortic arch and descending thoracic aorta. No aneurysm is present. 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 Small amount of contrast extends into the perineural root sleeve cysts bilaterally at T5-6 and  T6-7. No other significant extra canalicular CSF contrast is present in the thoracic spine. IMPRESSION: 1. Broad-based disc protrusion at L4-5 with asymmetric right-sided facet hypertrophy resulting in moderate foraminal stenosis bilaterally. 2. CSF contrast extends into the right foramen at L4-5 and beyond the right foramen, suggesting CSF leak. 3. 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 These can be associated with CSF leaks without definite leak. 4. Right laminectomy at L5-S1 with mild foraminal stenosis, worse right than left. 5. Multilevel spondylosis of the cervical spine with mild central and moderate to severe foraminal stenosis at C5-6. 6. Mild right foraminal narrowing at C2-3 and C3-4. 7. Moderate left foraminal stenosis at C3-4. 8. Moderate foraminal stenosis at L3-4 is worse left than right. 9. Pulmonary fibrosis. 10. More consolidated airspace disease in the right lower lobe is concerning for pneumonia. 11. Aortic Atherosclerosis (ICD10-I70.0). Electronically Signed   By: San Morelle M.D.   On: 12/27/2021 16:30   EEG adult  Result Date: 12/27/2021 Lora Havens, MD     12/27/2021  7:56 PM Patient Name: CHAMPAYNE KOCIAN MRN: [id number] Epilepsy Attending: Lora Havens Referring Physician/Provider: Lorenza Chick, MD Date: 12/27/2021 Duration: 23.24 mins Patient history: 76 yo WF with hx of afib, on xarelto, hx of CVA with residual right sided weakness, hx of cluster HA and migraines, CKD stage 3a, COPD, pulmonary fibrosis, seizure disorder, presented to ER with bitemporal headache today. EEG to evaluate for seizure Level of alertness: Awake AEDs during EEG study: TPM, PGB Technical aspects: This EEG study was done with scalp electrodes positioned according to the 10-20 International system  of electrode placement. Electrical activity was reviewed with band pass filter of 1-_0 , sensitivity of 7 uV/mm,  display speed of 37m/sec with a _1  notched filter applied as appropriate. EEG data were recorded continuously and digitally stored.  Video monitoring was available and reviewed as appropriate. Description: No posterior dominant rhythm was seen. EEG showed continuous generalized 3 to 6 Hz theta-delta slowing. Generalized periodic discharges with triphasic morphology at 1 Hz were also noted intermittently. Hyperventilation and photic stimulation were not performed.    ABNORMALITY - Periodic discharges with triphasic morphology, generalized ( GPDs) - Continuous slow, generalized  IMPRESSION: This study showed generalized periodic discharges with triphasic morphology which is on the ictal-interictal continuum. However, the morphology and frequency is more commonly indicative of toxic-metabolic causes. If suspicion for interictal activity remains a concern, a prolonged study can be considered.  There is also moderate diffuse encephalopathy, nonspecific etiology. No seizures were seen throughout the recording. PLora Havens  DG FL GUIDED LUMBAR PUNCTURE  Result Date: 12/27/2021 CLINICAL DATA:  Positional headaches. EXAM: DIAGNOSTIC LUMBAR PUNCTURE UNDER FLUOROSCOPIC GUIDANCE COMPARISON:  MR head without and with contrast 12/27/2021. MRI lumbar spine without contrast 12/25/2021 FLUOROSCOPY: Radiation Exposure Index (as provided by the fluoroscopic device): Dose area product 50.98 uGy*m2 PROCEDURE: Informed consent was obtained from the patient prior to the procedure, including potential complications of headache, allergy, and pain. With the patient prone, the lower back was prepped with Betadine. 1% Lidocaine was used for local anesthesia. Lumbar puncture was performed at the right paramedian L2-3 level using a 20 gauge needle with return of clear CSF. The pressure was too low to reach the top of the 9 cm needle. 1.5 ml of CSF was gently aspirated while monitoring for patient's symptoms. This was sent for  laboratory studies. The patient tolerated the procedure well and there were no apparent complications. IMPRESSION: Technically successful fluoroscopic guided lumbar puncture at L2-3. Below normal pressure. CSF pressure was less than 9 cm with the patient in the prone position. Lateral decubitus measurements were not able to be obtained due to the patient's condition. Electronically Signed   By: CSan MorelleM.D.   On: 12/27/2021 14:24   VAS [place] ART[place] BILATERAL  Result Date: 12/27/2021  TEMPORAL ARTERY REPORT Patient Name:  DANSHI JALLOH Date of Exam:   12/23/2021 Medical Rec #: [id number]       Accession #:    [id number] of Birth: [birth year]        Patient Gender: F Patient Age:   [age]years [facility]      [facility] [place] BILATERAL Referring Phys: SAlferd PateeKVib[facility]--------------------------------------------------------------------------------  Indications: Temoral pain bilaterally. High Risk Factors: Age > 51yrs and female.  Comparison Study: No prior studies. Performing Technologist: RDarlin CocoRDMS, RVT  Examination Guidelines: Patient in reclined position. 2D, color and spectral doppler sampling in the temporal artery along the hairline and temple in the longitudinal plane. 2D images along the hairline and temple in the transverse plane. Exam is bilateral.  Summary: Absence of a "halo" sign in the bilateral temporal artery, although not definitive, makes a diagnosis of temporal arteritis unlikely.  *See table(s) above for measurements and observations.  Electronically signed by PAntony ContrasMD on 12/27/2021 at 8:15:39 AM.   Final    MR BRAIN W WO CONTRAST  Result Date: 12/27/2021 CLINICAL DATA:  Concern for CSF leak, headache EXAM: MRI HEAD WITHOUT AND WITH CONTRAST TECHNIQUE:  Multiplanar, multiecho pulse sequences of the brain and surrounding structures were obtained without and with intravenous contrast. CONTRAST:  62m GADAVIST GADOBUTROL  1 MMOL/ML IV SOLN COMPARISON:  12/23/2021 MRI head without contrast FINDINGS: Brain: No new restricted diffusion to suggest acute or subacute infarct. Redemonstrated diffusion signal abnormality in the right basal ganglia, unchanged from 12/23/2021 and most likely related to the prior infarct than reflecting additional acute infarcts. Redemonstrated encephalomalacia and gliosis involving the right MCA territory, including the right basal ganglia and insula. Redemonstrated hemosiderin staining in the regions, likely related to petechial hemorrhage. No new area of hemosiderin deposition. No acute hemorrhage, mass, mass effect, or midline shift. No hydrocephalus or extra-axial collection. No abnormal parenchymal or meningeal enhancement. Normal size of the pituitary. Normal craniocervical junction. Vascular: Normal arterial flow voids. Normal arterial and venous enhancement. No evidence of expansion of the distal transverse venous sinuses at the transverse-sigmoid junction. Skull and upper cervical spine: Normal marrow signal. Sinuses/Orbits: Small air-fluid level in the right maxillary sinus. Mild mucosal thickening in the ethmoid air cells. Status post bilateral lens replacements. Other: Fluid in the mastoid air cells. IMPRESSION: 1. No acute intracranial process. No evidence of acute or subacute infarct. No abnormal parenchymal or meningeal enhancement. 2. No findings suggestive of spontaneous intracranial hypotension or CSF leak. No dural thickening/enhancement, enlargement of the pituitary gland, or expansion of the venous sinuses. 3. Air-fluid level in the right maxillary sinus, as can be seen with acute sinusitis. Correlate with symptoms. Electronically Signed   By: AMerilyn BabaM.D.   On: 12/27/2021 01:12   MR LUMBAR SPINE WO CONTRAST  Result Date: 12/25/2021 CLINICAL DATA:  Headache, sudden, severe concern for low pressure headache; Headache, sudden, severe Headache, new or worsening (Age >= 50y) low  pressure headache EXAM: MRI THORACIC AND LUMBAR SPINE WITHOUT CONTRAST TECHNIQUE: Multiplanar and multiecho pulse sequences of the thoracic and lumbar spine were obtained without intravenous contrast. COMPARISON:  CT abdomen pelvis 11/01/2021 FINDINGS: MRI THORACIC SPINE FINDINGS Alignment:  Trace degenerative anterolisthesis at C7-T1. Vertebrae: No fracture, evidence of discitis, or aggressive osseous lesion. Cord: No abnormal cord signal. Paraspinal and other soft tissues: There is increased signal in the lower lungs bilaterally and evidence of hilar adenopathy. Disc levels: There is mild disc bulging at T7-T8 and minimal right-sided disc bulging at T11-T12 and T12-L1. There is no significant spinal canal or neural foraminal stenosis at these or any other level in the thoracic spine. There are small perineural cysts on the left at T7-T8 and T8-T9 and on the right at T9-T10. MRI LUMBAR SPINE FINDINGS Segmentation:  Standard. Alignment:  Degenerative grade 1 anterolisthesis at L5-S1. Vertebrae: No fracture, evidence of discitis, or aggressive bone lesion. Conus medullaris and cauda equina: Conus extends to the L1 level. Conus and cauda equina appear normal. Paraspinal and other soft tissues: Paraspinal muscle atrophy. No other significant findings. Disc levels: L1-L2: Minimal disc bulging. Mild bilateral facet arthropathy. No significant stenosis. L2-L3: Mild disc bulging. Mild bilateral facet arthropathy. No significant stenosis. L3-L4: Small biforaminal disc bulging, ligamentum flavum hypertrophy and mild facet arthropathy results in mild spinal canal stenosis with bilateral subarticular stenosis, moderate left and mild-to-moderate right neural foraminal stenosis. L4-L5: Asymmetric right disc height loss and disc bulging with ligament flavum hypertrophy and bilateral facet arthropathy results in moderate right subarticular and severe right neural foraminal stenosis. Mild left subarticular and moderate neural  foraminal stenosis. L5-S1: Grade 1 degenerative anterolisthesis with disc bulging and advanced bilateral facet arthropathy. No spinal canal stenosis.  Moderate left and mild-to-moderate right neural foraminal stenosis. Tiny right-sided perineural cyst at S2. IMPRESSION: MR THORACIC SPINE IMPRESSION No acute findings in the thoracic spine.  No significant stenosis. MR LUMBAR SPINE IMPRESSION Multilevel degenerative changes of the lumbar spine, worst from L3 through S1, or some was below: L3-L4: Mild spinal canal and bilateral subarticular stenosis. Moderate left and mild-to-moderate right neural foraminal stenosis. L4-L5: Severe right neural foraminal stenosis likely impinging the exiting nerve root. Moderate right and subarticular stenosis encroaching the descending L5 nerve root. Mild left subarticular and moderate left neural foraminal stenosis. L5-S1: Degenerative grade 1 anterolisthesis. Moderate left and mild-to-moderate right neural foraminal stenosis. Electronically Signed   By: Maurine Simmering M.D.   On: 12/25/2021 15:57   MR THORACIC SPINE WO CONTRAST  Result Date: 12/25/2021 CLINICAL DATA:  Headache, sudden, severe concern for low pressure headache; Headache, sudden, severe Headache, new or worsening (Age >= 50y) low pressure headache EXAM: MRI THORACIC AND LUMBAR SPINE WITHOUT CONTRAST TECHNIQUE: Multiplanar and multiecho pulse sequences of the thoracic and lumbar spine were obtained without intravenous contrast. COMPARISON:  CT abdomen pelvis 11/01/2021 FINDINGS: MRI THORACIC SPINE FINDINGS Alignment:  Trace degenerative anterolisthesis at C7-T1. Vertebrae: No fracture, evidence of discitis, or aggressive osseous lesion. Cord: No abnormal cord signal. Paraspinal and other soft tissues: There is increased signal in the lower lungs bilaterally and evidence of hilar adenopathy. Disc levels: There is mild disc bulging at T7-T8 and minimal right-sided disc bulging at T11-T12 and T12-L1. There is no significant  spinal canal or neural foraminal stenosis at these or any other level in the thoracic spine. There are small perineural cysts on the left at T7-T8 and T8-T9 and on the right at T9-T10. MRI LUMBAR SPINE FINDINGS Segmentation:  Standard. Alignment:  Degenerative grade 1 anterolisthesis at L5-S1. Vertebrae: No fracture, evidence of discitis, or aggressive bone lesion. Conus medullaris and cauda equina: Conus extends to the L1 level. Conus and cauda equina appear normal. Paraspinal and other soft tissues: Paraspinal muscle atrophy. No other significant findings. Disc levels: L1-L2: Minimal disc bulging. Mild bilateral facet arthropathy. No significant stenosis. L2-L3: Mild disc bulging. Mild bilateral facet arthropathy. No significant stenosis. L3-L4: Small biforaminal disc bulging, ligamentum flavum hypertrophy and mild facet arthropathy results in mild spinal canal stenosis with bilateral subarticular stenosis, moderate left and mild-to-moderate right neural foraminal stenosis. L4-L5: Asymmetric right disc height loss and disc bulging with ligament flavum hypertrophy and bilateral facet arthropathy results in moderate right subarticular and severe right neural foraminal stenosis. Mild left subarticular and moderate neural foraminal stenosis. L5-S1: Grade 1 degenerative anterolisthesis with disc bulging and advanced bilateral facet arthropathy. No spinal canal stenosis. Moderate left and mild-to-moderate right neural foraminal stenosis. Tiny right-sided perineural cyst at S2. IMPRESSION: MR THORACIC SPINE IMPRESSION No acute findings in the thoracic spine.  No significant stenosis. MR LUMBAR SPINE IMPRESSION Multilevel degenerative changes of the lumbar spine, worst from L3 through S1, or some was below: L3-L4: Mild spinal canal and bilateral subarticular stenosis. Moderate left and mild-to-moderate right neural foraminal stenosis. L4-L5: Severe right neural foraminal stenosis likely impinging the exiting nerve root.  Moderate right and subarticular stenosis encroaching the descending L5 nerve root. Mild left subarticular and moderate left neural foraminal stenosis. L5-S1: Degenerative grade 1 anterolisthesis. Moderate left and mild-to-moderate right neural foraminal stenosis. Electronically Signed   By: Maurine Simmering M.D.   On: 12/25/2021 15:57   MR BRAIN WO CONTRAST  Result Date: 12/23/2021 CLINICAL DATA:  Initial evaluation for neuro deficit, stroke  suspected. EXAM: MRI HEAD WITHOUT CONTRAST TECHNIQUE: Multiplanar, multiecho pulse sequences of the brain and surrounding structures were obtained without intravenous contrast. COMPARISON:  Prior CT from 12/22/2021 and MRI from 05/03/2021. FINDINGS: Brain: Cerebral volume within normal limits for age. Mild chronic microvascular ischemic disease. Encephalomalacia and gliosis involving the right cerebral hemisphere and right basal ganglia, consistent with chronic right MCA distribution infarct. Associated scattered chronic hemosiderin staining present within these regions. Scattered diffusion signal abnormality within these areas most likely related to underlying susceptibility artifact. No other evidence for acute or subacute ischemia. Gray-white matter differentiation otherwise maintained. No other areas of chronic cortical infarction. No acute intracranial hemorrhage. Single punctate chronic microhemorrhage noted within the left cerebellum. No mass lesion, midline shift or mass effect. Mild ex vacuo dilatation of the right lateral ventricle related to the right cerebral encephalomalacia. No hydrocephalus. No extra-axial fluid collection. Pituitary gland and suprasellar region within normal limits. Vascular: Major intracranial vascular flow voids are maintained. Diminutive vertebrobasilar system noted, suspected to be related to fetal type origin of the PCAs. Skull and upper cervical spine: Craniocervical junction within normal limits. Bone marrow signal intensity normal. No scalp  soft tissue abnormality. Sinuses/Orbits: Prior bilateral ocular lens replacement. Scattered mucosal thickening noted throughout the ethmoidal air cells and maxillary sinuses. Small 2 moderate bilateral mastoid effusions noted, similar to prior, and likely chronic. Visualized nasopharynx unremarkable. Other: None. IMPRESSION: 1. No acute intracranial abnormality. 2. 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 Electronically Signed   By: Jeannine Boga M.D.   On: 12/23/2021 04:06   CT HEAD WO CONTRAST  Result Date: 12/22/2021 CLINICAL DATA:  Headache. EXAM: CT HEAD WITHOUT CONTRAST TECHNIQUE: Contiguous axial images were obtained from the base of the skull through the vertex without intravenous contrast. RADIATION DOSE REDUCTION: This exam was performed according to the departmental dose-optimization program which includes automated exposure control, adjustment of the mA and/or kV according to patient size and/or use of iterative reconstruction technique. COMPARISON:  Head CT dated 07/04/2021. FINDINGS: Brain: Old right MCA territory infarct and encephalomalacia. There is mild age-related atrophy and chronic microvascular ischemic changes. There is no acute intracranial hemorrhage. No mass effect or midline shift no extra-axial fluid collection. Vascular: No hyperdense vessel or unexpected calcification. Skull: Normal. Negative for fracture or focal lesion. Sinuses/Orbits: Diffuse mucoperiosteal thickening of paranasal sinuses and partial opacification of the ethmoid air cells. Small bilateral maxillary sinus air-fluid level. There is right mastoid effusion. Other: None IMPRESSION: 1. No acute intracranial pathology. 2. Old right MCA territory infarct and encephalomalacia. 3. Paranasal sinus disease and right mastoid effusion. Electronically Signed   By: Anner Crete M.D.   On: 12/22/2021 23:38   DG Chest 1 View  Result Date: 12/22/2021 CLINICAL  DATA:  Headaches and foaming at the mouth. EXAM: CHEST  1 VIEW COMPARISON:  11/01/2021. FINDINGS: Heart is enlarged the mediastinal contour stable. Atherosclerotic calcification of the aorta is noted. Coarse interstitial markings are noted bilaterally. Mild airspace disease is noted at the lung bases. No effusion or pneumothorax. Sternotomy wires are present over the midline. No acute osseous abnormality. IMPRESSION: 1. Mild atelectasis or infiltrate at the lung bases. 2. Chronic interstitial changes in the lungs. Electronically Signed   By: Brett Fairy M.D.   On: 12/22/2021 23:11    Microbiology: Results for orders placed or performed during the hospital encounter of 12/22/21  Resp Panel by RT-PCR (Flu A&B, Covid) Anterior Nasal Swab     Status: None  Collection Time: 12/22/21  9:55 PM   Specimen: Anterior Nasal Swab  Result Value Ref Range Status   SARS Coronavirus 2 by RT PCR NEGATIVE NEGATIVE Final    Comment: (NOTE) SARS-CoV-2 target nucleic acids are NOT DETECTED.  The SARS-CoV-2 RNA is generally detectable in upper respiratory specimens during the acute phase of infection. The lowest concentration of SARS-CoV-2 viral copies this assay can detect is 138 copies/mL. A negative result does not preclude SARS-Cov-2 infection and should not be used as the sole basis for treatment or other patient management decisions. A negative result may occur with  improper specimen collection/handling, submission of specimen other than nasopharyngeal swab, presence of viral mutation(s) within the areas targeted by this assay, and inadequate number of viral copies(<138 copies/mL). A negative result must be combined with clinical observations, patient history, and epidemiological information. The expected result is Negative.  Fact Sheet for Patients:  EntrepreneurPulse.com.au  Fact Sheet for Healthcare Providers:  IncredibleEmployment.be  This test is no t yet  approved or cleared by the [place] FDA and  has been authorized for detection and/or diagnosis of SARS-CoV-2 by FDA under an Emergency Use Authorization (EUA). This EUA will remain  in effect (meaning this test can be used) for the duration of the COVID-19 declaration under Section 564(b)(1) of the Act, 21 U.S.C.section 360bbb-3(b)(1), unless the authorization is terminated  or revoked sooner.       Influenza A by PCR NEGATIVE NEGATIVE Final   Influenza B by PCR NEGATIVE NEGATIVE Final    Comment: (NOTE) The Xpert Xpress SARS-CoV-2/FLU/RSV plus assay is intended as an aid in the diagnosis of influenza from Nasopharyngeal swab specimens and should not be used as a sole basis for treatment. Nasal washings and aspirates are unacceptable for Xpert Xpress SARS-CoV-2/FLU/RSV testing.  Fact Sheet for Patients: EntrepreneurPulse.com.au  Fact Sheet for Healthcare Providers: IncredibleEmployment.be  This test is not yet approved or cleared by the [place] FDA and has been authorized for detection and/or diagnosis of SARS-CoV-2 by FDA under an Emergency Use Authorization (EUA). This EUA will remain in effect (meaning this test can be used) for the duration of the COVID-19 declaration under Section 564(b)(1) of the Act, 21 U.S.C. section 360bbb-3(b)(1), unless the authorization is terminated or revoked.  Performed at [facility], [place] [address], [place], [place] [address]   Surgical pcr screen     Status: Abnormal   Collection Time: 12/26/21 11:59 PM   Specimen: Nasal Mucosa; Nasal Swab  Result Value Ref Range Status   MRSA, PCR NEGATIVE NEGATIVE Final   Staphylococcus aureus POSITIVE (A) NEGATIVE Final    Comment: (NOTE) The Xpert SA Assay (FDA approved for NASAL specimens in patients 54 years of age and older), is one component of a comprehensive surveillance program. It is not intended to diagnose infection nor to guide or  monitor treatment. Performed at [facility], [place] [address] [place], [place] [address]   CSF culture w Gram Stain     Status: None   Collection Time: 12/27/21  1:20 PM   Specimen: CSF; Cerebrospinal Fluid  Result Value Ref Range Status   Specimen Description CSF  Final   Special Requests NONE  Final   Gram Stain   Final    WBC PRESENT,BOTH PMN AND MONONUCLEAR NO ORGANISMS SEEN CYTOSPIN SMEAR    Culture   Final    NO GROWTH 3 DAYS Performed at [facility], [place] [address], [place], [place] [address]  Report Status 12/30/2021 FINAL  Final    Labs: CBC: Recent Labs  Lab 12/31/21 0207 01/01/22 0819 01/02/22 1858 01/03/22 0335 01/04/22 0455  WBC 15.6* 12.7* 13.8* 14.0* 14.3*  HGB 12.9 12.7 14.0 12.0 13.0  HCT 39.0 39.9 44.0 36.8 40.4  MCV 84.2 86.4 87.3 86.4 86.0  PLT 377 351 278 293 875   Basic Metabolic Panel: Recent Labs  Lab 01/01/22 0819 01/02/22 0738 01/03/22 0335  NA 141 141 136  K 2.8* 4.2 4.0  CL 115* 117* 113*  CO2 17* 16* 17*  GLUCOSE 92 83 85  BUN _0 CREATININE 1.17* 1.15* 1.12*  CALCIUM 8.0* 8.1* 7.8*  MG  --  2.1  --    Liver Function Tests: Recent Labs  Lab 01/02/22 0735  AST 18  ALT 12  ALKPHOS 101  BILITOT 0.4  PROT 6.3*  ALBUMIN 2.0*   CBG: Recent Labs  Lab 01/03/22 0643 01/03/22 1152 01/03/22 1606 01/03/22 2053 01/04/22 0627  GLUCAP 76 95 113* 95 93    Discharge time spent: greater than 30 minutes.  Signed: [name], MD [facility] 01/04/2022

## 2022-01-04 NOTE — Chronic Care Management (AMB) (Signed)
  Care Coordination   Note   01/04/2022 Name: RUTHEL MARTINE MRN: [id number] DOB: 06-Sep-1945  ALANNIS HSIA is a 76 y.o. year old female who sees Crist Fat, MD for primary care. I reached out to Jake Seats by phone today to offer care coordination services.  Ms. Garrow was given information about Care Coordination services today including:   The Care Coordination services include support from the care team which includes your Nurse Coordinator, Clinical Social Worker, or Pharmacist.  The Care Coordination team is here to help remove barriers to the health concerns and goals most important to you. Care Coordination services are voluntary, and the patient may decline or stop services at any time by request to their care team member.   Care Coordination Consent Status: Patient agreed to services and verbal consent obtained.   Follow up plan:  Telephone appointment with care coordination team member scheduled for:  01/08/2022  Encounter Outcome:  Pt. Scheduled from referral

## 2022-01-04 NOTE — TOC Transition Note (Addendum)
Transition of Care [facility]) - CM/SW Discharge Note   Patient Details  Name: Jenny Kelly MRN: [id number] Date of Birth: 10-03-1945  [facility] CM/SW Contact:  Kermit Balo, RN Phone Number: 01/04/2022, 11:30 AM   Clinical Narrative:    Patient discharging home today. No needs per TOC. Pt has transportation home.   1355: Spouse requesting home health services at home. They have used [facility] in the past and asked to use them again. [facility] accepted and information on the AVS.    Final next level of care: Home/Self Care Barriers to Discharge: No Barriers Identified   Patient Goals and CMS Choice        Discharge Placement                       Discharge Plan and Services                                     Social Determinants of Health (SDOH) Interventions     Readmission Risk Interventions     No data to display

## 2022-01-08 ENCOUNTER — Ambulatory Visit: Payer: Self-pay

## 2022-01-08 DIAGNOSIS — G44009 Cluster headache syndrome, unspecified, not intractable: Secondary | ICD-10-CM | POA: Diagnosis not present

## 2022-01-08 DIAGNOSIS — H53149 Visual discomfort, unspecified: Secondary | ICD-10-CM | POA: Diagnosis not present

## 2022-01-08 DIAGNOSIS — I5030 Unspecified diastolic (congestive) heart failure: Secondary | ICD-10-CM | POA: Diagnosis not present

## 2022-01-08 DIAGNOSIS — I480 Paroxysmal atrial fibrillation: Secondary | ICD-10-CM | POA: Diagnosis not present

## 2022-01-08 NOTE — Patient Outreach (Signed)
  Care Coordination   Initial Visit Note   01/08/2022 Name: Jenny Kelly MRN: [id number] DOB: 08-27-1945  Jenny Kelly is a 76 y.o. year old female who sees Townsend Roger, MD for primary care. I spoke with  Jenny Kelly by phone today.  What matters to the patients health and wellness today?  Patient with recent discharge from hospital ( 4 days ago).  Concern for today is weakness and fear of falling.     Goals Addressed               This Visit's Progress     Weakness and fear of falling (pt-stated)        Care Coordination Interventions: Reviewed medications and discussed potential side effects of medications such as dizziness and frequent urination Assessed for falls since last encounter Advised patient to discuss hospital follow up with provider Screening for signs and symptoms of depression related to chronic disease state  Assessed social determinant of health barriers Reviewed recent admission and concerns for weakness.  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  Reports mostly on bedrest for 12 days while in the hospital. Reports she has had a hard time with weakness. Reports home health Jenny Kelly is coming today. Reports she has been doing some leg exercises at home.  Reviewed importance of good nutrition and hydration. Reviewed use of pain medication and how it can make you dizzy and constipated.          SDOH assessments and interventions completed:  Yes  SDOH Interventions Today    Flowsheet Row Most Recent Value  SDOH Interventions   Food Insecurity Interventions Intervention Not Indicated  Housing Interventions Intervention Not Indicated  Transportation Interventions Intervention Not Indicated  Utilities Interventions Intervention Not Indicated        Care Coordination Interventions Activated:  Yes  Care Coordination Interventions:  Yes, provided   Follow up plan: Follow up call scheduled for 02/05/2022 at [place]     Encounter Outcome:  Pt. Visit Completed   Tomasa Rand, RN, BSN, CEN [place] Coordinator [phone]

## 2022-02-03 DIAGNOSIS — R519 Headache, unspecified: Secondary | ICD-10-CM | POA: Diagnosis not present

## 2022-02-03 DIAGNOSIS — G9600 Cerebrospinal fluid leak, unspecified: Secondary | ICD-10-CM | POA: Diagnosis not present

## 2022-02-05 ENCOUNTER — Ambulatory Visit: Payer: Self-pay

## 2022-03-09 ENCOUNTER — Ambulatory Visit: Payer: Medicare HMO | Attending: Cardiology | Admitting: Cardiology

## 2022-03-09 ENCOUNTER — Encounter: Payer: Self-pay | Admitting: Cardiology

## 2022-03-09 VITALS — BP 100/60 | HR 64 | Ht 62.0 in | Wt 132.2 lb

## 2022-03-09 DIAGNOSIS — Z951 Presence of aortocoronary bypass graft: Secondary | ICD-10-CM

## 2022-03-09 DIAGNOSIS — I6930 Unspecified sequelae of cerebral infarction: Secondary | ICD-10-CM | POA: Diagnosis not present

## 2022-03-09 DIAGNOSIS — E785 Hyperlipidemia, unspecified: Secondary | ICD-10-CM

## 2022-03-09 DIAGNOSIS — J8410 Pulmonary fibrosis, unspecified: Secondary | ICD-10-CM

## 2022-03-09 DIAGNOSIS — I2510 Atherosclerotic heart disease of native coronary artery without angina pectoris: Secondary | ICD-10-CM | POA: Diagnosis not present

## 2022-03-09 DIAGNOSIS — I480 Paroxysmal atrial fibrillation: Secondary | ICD-10-CM | POA: Diagnosis not present

## 2022-03-09 NOTE — Patient Instructions (Signed)
Medication Instructions:  Your physician has recommended you make the following change in your medication:   START: Zetia 10mg  1 tablet daily    Lab Work: Your physician recommends that you return for lab work in: 6 weeks You need to have labs done when you are fasting.  You can come Monday through Friday 8:30 am to 12:00 pm and 1:15 to 4:30. You do not need to make an appointment as the order has already been placed. The labs you are going to have done are AST, ALT,  Lipids.    Testing/Procedures: None Ordered   Follow-Up: At [facility], you and your health needs are our priority.  As part of our continuing mission to provide you with exceptional heart care, we have created designated Provider Care Teams.  These Care Teams include your primary Cardiologist (physician) and Advanced Practice Providers (APPs -  Physician Assistants and Nurse Practitioners) who all work together to provide you with the care you need, when you need it.  We recommend signing up for the patient portal called "MyChart".  Sign up information is provided on this After Visit Summary.  MyChart is used to connect with patients for Virtual Visits (Telemedicine).  Patients are able to view lab/test results, encounter notes, upcoming appointments, etc.  Non-urgent messages can be sent to your provider as well.   To learn more about what you can do with MyChart, go to [facility].    Your next appointment:   6 month(s)  The format for your next appointment:   In Person  Provider:   [email or web address], MD    Other Instructions NA

## 2022-03-09 NOTE — Addendum Note (Signed)
Addended by: Baldo Ash D on: 03/09/2022 10:35 AM   Modules accepted: Orders

## 2022-03-09 NOTE — Progress Notes (Signed)
Cardiology Office Note:    Date:  03/09/2022   ID:  Jenny Kelly, DOB [birth year], MRN [id number]  PCP:  Crist Fat, MD  Cardiologist:  Gypsy Balsam, MD    Referring MD: Leonia Reader, Barbara Cower, MD   Chief Complaint  Patient presents with   follow hospitalization 12/22/2021 [facility]    History of Present Illness:    Jenny Kelly is a 76 y.o. female   with past medical history significant for coronary artery disease, she did have coronary bypass grafting x3 in 2015, history of kidney disease hyperlipidemia PE paroxysmal atrial fibrillation diabetes seizures thyroid disorder COPD.  In January she presented to the hospital because of stroke.  She was still taking Xarelto in spite of that she end up having stroke it was addressed by adding aspirin to her medical regimen.  She was also find to have urinary tract infection and kidney stone and she did have urinary stent placed.  Plan was to perform lithotripsy with stent replacement however surgery had to be postponed because of inability to interrupt her anticoagulation secondary to fresh stroke.  Finally the end of April she had a procedure done seems to be recovering quite nicely however what happened now is the fact that she is having some infectious process going on she got elevation of white blood cell count.  She comes today to my office for follow-up.  Since have seen her last time there is significant development.  She was admitted to [facility] because of headaches.  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  Eventually patch has been applied for sore was unsuccessful done successful.  She is coming today to my office still complaining not feeling well/have some dizzy spells as well as some headaches.  Take Tylenol which seems to be helping with that.  Denies have any cardiac complaints.  There is no chest pain tightness squeezing pressure burning chest.  Past Medical History:  Diagnosis Date   Acquired asymmetrical kidneys  06/21/2015   Formatting of this note might be different from the original. 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   Allergy 06/09/2021   Anemia    Anxiety    Arthritis    Asthma    Atrial fibrillation (HCC)    Blood transfusion without reported diagnosis    Cataract    CHF (congestive heart failure) (HCC)    Chronic kidney disease    Clotting disorder (HCC)    COPD (chronic obstructive pulmonary disease) (HCC)    Dementia (HCC)    Depression    Diabetes mellitus without complication (HCC)    Dyspnea    GERD (gastroesophageal reflux disease)    Great toe pain, left 11/01/2021   Headache    Heart murmur    History of CVA with residual deficit 11/01/2021   History of kidney stones    Hyperlipidemia    Hypothyroidism    Myocardial infarction [facility])    Neuromuscular disorder (HCC)    tremors   Pneumonia due to COVID-19 virus    Postoperative wound infection 03/10/2014   Seizures (HCC)    Sleep apnea    Stroke (HCC)    Tension-type headache, not intractable 01/10/2016   Thyroid disease    Wide-complex tachycardia     Past Surgical History:  Procedure Laterality Date   ABDOMINAL HYSTERECTOMY     APPENDECTOMY     bladder tack  CHOLECYSTECTOMY     CORONARY ARTERY BYPASS GRAFT     CYSTOSCOPY W/ URETERAL STENT PLACEMENT Left 05/01/2021   Procedure: CYSTOSCOPY WITH RETROGRADE PYELOGRAM/URETERAL STENT PLACEMENT;  Surgeon: Rene Paci, MD;  Location: [facility] OR;  Service: Urology;  Laterality: Left;   CYSTOSCOPY/URETEROSCOPY/HOLMIUM LASER/STENT PLACEMENT Left 08/09/2021   Procedure: CYSTOSCOPY/URETEROSCOPY/HOLMIUM LASER/STENT EXCHANGE;  Surgeon: Rene Paci, MD;  Location: [facility] ORS;  Service: Urology;  Laterality: Left;   ESOPHAGOGASTRODUODENOSCOPY ENDOSCOPY     EYE SURGERY     FRACTURE SURGERY Right    3rd finger   IR FLUORO GUIDED NEEDLE PLC ASPIRATION/INJECTION LOC  12/27/2021   IR FLUORO GUIDED NEEDLE PLC  ASPIRATION/INJECTION LOC  01/01/2022   JOINT REPLACEMENT Bilateral    knees   TONSILLECTOMY AND ADENOIDECTOMY     TUBAL LIGATION      Current Medications: Current Meds  Medication Sig   acetaminophen (TYLENOL) 500 MG tablet Take 500 mg by mouth every 6 (six) hours as needed for mild pain or moderate pain.   aspirin 81 MG EC tablet Take 1 tablet (81 mg total) by mouth daily. Swallow whole.   cetirizine (ZYRTEC) 10 MG tablet Take 10 mg by mouth daily.   docusate sodium (COLACE) 100 MG capsule Take 100 mg by mouth daily as needed for mild constipation or moderate constipation.   donepezil (ARICEPT) 10 MG tablet Take 1 tablet (10 mg total) by mouth at bedtime.   guaiFENesin-dextromethorphan (ROBITUSSIN DM) 100-10 MG/5ML syrup Take 10 mLs by mouth every 4 (four) hours as needed for cough.   levothyroxine (SYNTHROID) 88 MCG tablet Take 88 mcg by mouth daily before breakfast.   montelukast (SINGULAIR) 10 MG tablet Take 1 tablet (10 mg total) by mouth at bedtime.   Multiple Vitamin (MULTIVITAMIN WITH MINERALS) TABS tablet Take 1 tablet by mouth daily.   Omega-3 Fatty Acids (FISH OIL) 1200 MG CAPS Take 1,200 mg by mouth 2 (two) times daily.   omeprazole (PRILOSEC) 40 MG capsule Take 1 capsule (40 mg total) by mouth daily. (Patient taking differently: Take 40 mg by mouth 2 (two) times daily.)   oxyCODONE (OXY IR/ROXICODONE) 5 MG immediate release tablet Take 1 tablet (5 mg total) by mouth every 4 (four) hours as needed for breakthrough pain.   pregabalin (LYRICA) 50 MG capsule TAKE 1 CAPSULE BY MOUTH 2 TIMES DAILY. (Patient taking differently: Take 50 mg by mouth 2 (two) times daily.)   Probiotic Product (PROBIOTIC PO) Take 15 Billion Cells by mouth daily.   Rivaroxaban (XARELTO) 15 MG TABS tablet Take 1 tablet (15 mg total) by mouth daily.   topiramate (TOPAMAX) 200 MG tablet Take 1 tablet (200 mg total) by mouth 2 (two) times daily.   URINARY HEALTH/CRANBERRY PO Take 1 tablet by mouth 2 (two) times  daily.   [DISCONTINUED] budesonide (PULMICORT) 0.5 MG/2ML nebulizer solution Take 2 mLs (0.5 mg total) by nebulization in the morning and at bedtime.   [DISCONTINUED] ondansetron (ZOFRAN) 4 MG tablet Take 1 tablet (4 mg total) by mouth every 8 (eight) hours as needed for nausea or vomiting.   [DISCONTINUED] pravastatin (PRAVACHOL) 80 MG tablet Take 1 tablet (80 mg total) by mouth every evening.     Allergies:   Ciprofloxacin, Prochlorperazine edisylate, Morphine, Prednisone, Reduced iso-alpha acids complex, Statins, Sulfa antibiotics, Azithromycin, and Latex   Social History   Socioeconomic History   Marital status: Married    Spouse name: Vonna Kotyk   Number of children: 3   Years of education: Not on file  Highest education level: Not on file  Occupational History   Not on file  Tobacco Use   Smoking status: Former    Packs/day: 0.50    Years: 30.00    Total pack years: 15.00    Types: Cigarettes    Quit date: 06/26/2000    Years since quitting: 21.7   Smokeless tobacco: Never  Vaping Use   Vaping Use: Former  Substance and Sexual Activity   Alcohol use: Not Currently   Drug use: Never   Sexual activity: Not on file  Other Topics Concern   Not on file  Social History Narrative   Not on file   Social Determinants of Health   Financial Resource Strain: Not on file  Food Insecurity: No Food Insecurity (01/08/2022)   Hunger Vital Sign    Worried About Running Out of Food in the Last Year: Never true    Ran Out of Food in the Last Year: Never true  Transportation Needs: No Transportation Needs (01/08/2022)   PRAPARE - Administrator, Civil Service (Medical): No    Lack of Transportation (Non-Medical): No  Physical Activity: Not on file  Stress: Not on file  Social Connections: Not on file     Family History: The patient's family history includes Breast cancer in her mother; Heart attack in her mother; Heart disease in her mother and sister. ROS:   Please see the  history of present illness.    All 14 point review of systems negative except as described per history of present illness  EKGs/Labs/Other Studies Reviewed:      Recent Labs: 05/01/2021: B Natriuretic Peptide 493.7 05/02/2021: TSH 0.784 01/02/2022: ALT 12; Magnesium 2.1 01/03/2022: BUN 11; Creatinine, Ser 1.12; Potassium 4.0; Sodium 136 01/04/2022: Hemoglobin 13.0; Platelets 306  Recent Lipid Panel    Component Value Date/Time   CHOL 148 05/03/2021 0538   TRIG 132 05/03/2021 0538   HDL 24 (L) 05/03/2021 0538   CHOLHDL 6.2 05/03/2021 0538   VLDL 26 05/03/2021 0538   LDLCALC 98 05/03/2021 0538    Physical Exam:    VS:  BP 100/60 (BP Location: Left Arm, Patient Position: Sitting)   Pulse 64   Ht 5\' 2"  (1.575 m)   Wt 132 lb 3.2 oz (60 kg)   SpO2 94%   BMI 24.18 kg/m     Wt Readings from Last 3 Encounters:  03/09/22 132 lb 3.2 oz (60 kg)  01/04/22 133 lb 6.1 oz (60.5 kg)  11/04/21 135 lb 12.9 oz (61.6 kg)     GEN:  Well nourished, well developed in no acute distress HEENT: Normal NECK: No JVD; No carotid bruits LYMPHATICS: No lymphadenopathy CARDIAC: RRR, no murmurs, no rubs, no gallops RESPIRATORY:  Clear to auscultation without rales, wheezing or rhonchi  ABDOMEN: Soft, non-tender, non-distended MUSCULOSKELETAL:  No edema; No deformity  SKIN: Warm and dry LOWER EXTREMITIES: no swelling NEUROLOGIC:  Alert and oriented x 3 PSYCHIATRIC:  Normal affect   ASSESSMENT:    1. S/P CABG (coronary artery bypass graft)   2. Pulmonary fibrosis (HCC)   3. Coronary artery disease involving native coronary artery of native heart without angina pectoris   4. History of CVA with residual deficit   5. PAF (paroxysmal atrial fibrillation) (HCC)    PLAN:    In order of problems listed above:  Coronary disease doing well from that point review denies have any symptoms.  On appropriate medication which include antiplatelets therapy. Pulmonary fibrosis: That being followed  by  internal medicine team. History of CVA she is on anticoagulation for Xarelto as well as aspirin.  We will continue. Paroxysmal atrial fibrillation, CHADS2 vascular equals 5, continue anticoagulation Dyslipidemia: I did review her K PN which show me her LDL of 116 HDL 37 this is from May of this year, she is intolerant to statin we will try Zetia 10   Medication Adjustments/Labs and Tests Ordered: Current medicines are reviewed at length with the patient today.  Concerns regarding medicines are outlined above.  No orders of the defined types were placed in this encounter.  Medication changes: No orders of the defined types were placed in this encounter.   Signed, Georgeanna Lea, MD, [facility] 03/09/2022 10:25 AM    [facility]

## 2022-03-19 ENCOUNTER — Ambulatory Visit: Payer: Medicare HMO | Admitting: Internal Medicine

## 2022-03-20 ENCOUNTER — Ambulatory Visit (INDEPENDENT_AMBULATORY_CARE_PROVIDER_SITE_OTHER): Payer: Medicare HMO

## 2022-03-20 DIAGNOSIS — Z23 Encounter for immunization: Secondary | ICD-10-CM | POA: Diagnosis not present

## 2022-03-20 NOTE — Progress Notes (Signed)
Flu shot

## 2022-03-29 ENCOUNTER — Ambulatory Visit (INDEPENDENT_AMBULATORY_CARE_PROVIDER_SITE_OTHER): Payer: Medicare HMO | Admitting: Internal Medicine

## 2022-03-29 ENCOUNTER — Encounter: Payer: Self-pay | Admitting: Internal Medicine

## 2022-03-29 VITALS — BP 122/72 | HR 82 | Temp 97.2°F | Resp 16 | Ht 62.0 in | Wt 131.0 lb

## 2022-03-29 DIAGNOSIS — N1832 Chronic kidney disease, stage 3b: Secondary | ICD-10-CM

## 2022-03-29 DIAGNOSIS — E7800 Pure hypercholesterolemia, unspecified: Secondary | ICD-10-CM

## 2022-03-29 DIAGNOSIS — R519 Headache, unspecified: Secondary | ICD-10-CM | POA: Diagnosis not present

## 2022-03-29 DIAGNOSIS — G8929 Other chronic pain: Secondary | ICD-10-CM | POA: Diagnosis not present

## 2022-03-29 DIAGNOSIS — Z789 Other specified health status: Secondary | ICD-10-CM | POA: Insufficient documentation

## 2022-03-29 DIAGNOSIS — E1121 Type 2 diabetes mellitus with diabetic nephropathy: Secondary | ICD-10-CM | POA: Diagnosis not present

## 2022-03-29 DIAGNOSIS — E1159 Type 2 diabetes mellitus with other circulatory complications: Secondary | ICD-10-CM | POA: Diagnosis not present

## 2022-03-29 DIAGNOSIS — Z8673 Personal history of transient ischemic attack (TIA), and cerebral infarction without residual deficits: Secondary | ICD-10-CM

## 2022-03-29 MED ORDER — PREGABALIN 75 MG PO CAPS: 75.0000 mg | ORAL_CAPSULE | Freq: Three times a day (TID) | ORAL | 2 refills | Status: DC

## 2022-03-29 MED ORDER — LABETALOL 40 MG/ML PEDIATRIC ORAL SUSPENSION: 25.0000 mg | Freq: Two times a day (BID) | ORAL | 2 refills | Status: DC

## 2022-03-29 NOTE — Assessment & Plan Note (Signed)
She has tried multiple statins, zetia, welchol, and even praulent.  She had pancreatitis from praulent.  She does not want to be on nexletol due to worries about recurrent pancreatitis.

## 2022-03-29 NOTE — Assessment & Plan Note (Signed)
She has a history of stroke.  Continue risk factor modifications and she is on xarelto for her history of A. Fib.  She is also on ASA 81mg  daily.

## 2022-03-29 NOTE — Assessment & Plan Note (Signed)
She saw cardiology 3 weeks ago and they wanted to start her on zetia but she is not going to do this as she has had side effects from this medication in the past.  We will check her FLP today.

## 2022-03-29 NOTE — Progress Notes (Signed)
Office Visit  Subjective   Patient ID: Jenny Kelly   DOB: April 13, 1946   Age: 76 y.o.   MRN: [id number]   Chief Complaint Chief Complaint  Patient presents with   Follow-up    Kidney function     History of Present Illness Jenny Kelly returns today for routine followup on her cholesterol.  This past year, her cholesterol was elevated and I increased her pravastatin from 80m to 82mdaily.  Unfortunately, she had myalgias from this and discontinued her pravastatin.  She just saw Dr. K Raliegh Ipn cardiology on 03/09/2022 and he wanted her to started zetia.  She did not do this as she has been on zetia before and this has caused severe diarrhea.  I wanted her to start nexletol for her high cholesterol as she has an intolerance to statins.  She never did start the medicine as she was afraid of catching pancreatitis again.  Again, she stopped praulent over a year ago as it may have caused her pancreatitis.  Overall, she states she is doing well and is without any complaints or problems at this time. She has a history of statin intolerance where she has had myalgias from zocor, lipitor, pravastatin, and crestor. She was also tried on welchol and she did not like taking this. She specifically denies abdominal pain, nausea, vomiting, diarrhea, myalgias, and fatigue. She remains on dietary management at this time.  She was on fenofibrate but they stopped this as it could have caused her pancreatitis.  After her stroke in 04/2021, I started her on pravastatin 4038mhs but again she stopped this due to myalgias..  She is on diet as well as fish oil for her HCL.  She is fasting in anticipation for labs today.  The patient is a 76 15ar old Caucasian/White female who returns for a follow-up visit for her diet controlled diabetes. Since the last visit, there have been no problems. She remains on no medications; the diabetes is being managed by dietary intervention alone. She is not walking as much as they would  like.. She specifically denies unexplained abdominal pain, nausea or vomiting and documented hypoglycemia. She has not been checking her blood sugars at home as they do not have a glucometer. She came in fasting today in anticipation of lab work. Her last HgBA1c was done in 08/2021 and was 6.1%.  There are no complications of diabetic retinopathy or neuropathy.  She has CKD probably secondary to her HTN and diabetic nephropathy.  She has a history of cardiovascular diease as well and history of stroke but her diabetes has always been controlled.   The patient has a history of Stage IIIb CKD.  She was seeing the nephrologist once every 3-4 months with her last visit was in 2019 and she states that she is not going back.  Her creatinine at that time was 1.5.  Her GFR is around from 30-50.   We have noted that she went from Stage IIIa to Stage IIIb CKD over the past 1-2 years.   Jenny Kelly a 76 89 female who also returns for her history of post stroke headaches.  [facility].  She has a history of post stroke headaches where I have placed her on lyrica in the past which has helped this.  She developed a severe bitemporal headache (like a band across her forehead)  on the day of admission where she initially thought she was  having a stroke and 911 was called.  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  Neurology was consulted and the patient underwent a MRI and Ct scans of the brain which were negative.  They did a temporal artery [place] which was negative.  Neurology recommended a CT myelogram which showed a CSF contrast extending into the right L4-L5 foramen.  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  She continued to have have symptoms and she went for a repeat blood patch and her symptoms improved.  The doctors wanted her to followup with  [facility] neurology as an outpatient.  She has not followed up with Neurology.   Today, she states her headaches are still occurring daily and she is taking her lyrica 69m in AM and PM but she takes an extra dose in the afternoon.  She states tylenol does help.  There is no new weakness/numbness, change in her vision or other problems.  Neurology repeated a repeat CT scan of her head in 05/2015 and this showed an old stroke but nothing new. She did have an overnight EEG done on 12/30/2021 and this showed moderate diffuse encephalopathy with nonspecific etiology but no seizures were noted.  She had a MRI of her brain on 12/27/2021 which showed no acute intracranial abnormality but there were old unchanged signal abnormality in her right basal ganglia related to prior infarct.  There was encephalomalacia involving the right MCA territory including the right basal ganglia.  There was mild chronic microvascular disease.     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   Allergy 06/09/2021   Anemia    Anxiety    Arthritis    Asthma    [place]rial fibrillation [place]    Blood transfusion without reported diagnosis    Cataract    CHF (congestive heart failure) (HCC)    Chronic kidney disease    Clotting disorder (HCC)    COPD (chronic obstructive pulmonary disease) (HCC)    Dementia (HCC)    Depression    Diabetes mellitus without complication (HCC)    Dyspnea    GERD (gastroesophageal reflux disease)    Great toe pain, left 11/01/2021   Headache    Heart murmur    History of CVA with residual deficit 11/01/2021   History of kidney stones    Hyperlipidemia    Hypothyroidism    Myocardial infarction ([facility]    Neuromuscular disorder [place]    tremors   Pneumonia due to COVID-19 virus    Postoperative wound  infection 03/10/2014   Seizures ([place]    Sleep apnea    Stroke (HCC)    Tension-type headache, not intractable 01/10/2016   Thyroid disease    Wide-complex tachycardia      Allergies Allergies  Allergen Reactions   Ciprofloxacin Hives    Ask patient   Prochlorperazine Edisylate Other (See Comments)    Cant swallow Muscle cramping   Morphine Other (See Comments)    Confusion   Prednisone Other (See Comments)    unknown   Reduced Iso-Alpha Acids Complex Other (See Comments)    unknown   Statins Other (See Comments)    Muscle  weakness    Sulfa Antibiotics Swelling   Azithromycin Rash   Latex Rash     Review of Systems Review of Systems  Constitutional:  Negative for chills and fever.  Eyes:  Negative for blurred vision and double vision.  Respiratory:  Negative for cough and shortness of breath.   Cardiovascular:  Positive for leg swelling. Negative for chest pain and palpitations.  Gastrointestinal:  Negative for constipation, diarrhea, nausea and vomiting.  Musculoskeletal:  Negative for back pain, myalgias and neck pain.  Skin:  Negative for itching and rash.  Neurological:  Positive for headaches. Negative for dizziness and weakness.  Psychiatric/Behavioral:  Negative for depression. The patient is not nervous/anxious.        Objective:    Vitals BP 122/72   Pulse 82   Temp (!) 97.2 F (36.2 C)   Resp 16   Ht _0  (1.575 m)   Wt 131 lb 0.2 oz (59.4 kg)   SpO2 94%   BMI 23.96 kg/m    Physical Examination Physical Exam Constitutional:      Appearance: Normal appearance. She is not ill-appearing.  Cardiovascular:     Rate and Rhythm: Normal rate and regular rhythm.     Pulses: Normal pulses.     Heart sounds: Normal heart sounds. No murmur heard.    No friction rub. No gallop.  Pulmonary:     Effort: Pulmonary effort is normal. No respiratory distress.     Breath sounds: Normal breath sounds. No wheezing, rhonchi or rales.  Abdominal:      General: Abdomen is flat. Bowel sounds are normal. There is no distension.     Palpations: Abdomen is soft.     Tenderness: There is no abdominal tenderness.  Musculoskeletal:     Right lower leg: No edema.     Left lower leg: No edema.  Skin:    General: Skin is warm and dry.     Findings: No rash.  Neurological:     General: No focal deficit present.     Mental Status: She is alert and oriented to person, place, and time.  Psychiatric:        Mood and Affect: Mood normal.        Assessment & Plan:   Diabetic nephropathy associated with type 2 diabetes mellitus ([place]) She has diabetic nephropathy with Stage IIIb CKD.  Her diabetes has been diet controlled.   She is not on a ACE-I.  We can consider farxiga or even jardiance but we will wait to see what her labs are today.  Stage 3b chronic kidney disease ([place]) Over the last year, her CKD has worsening.  Again, I want her to remain on off NSAIDS.  We will consider jardiance or farxia once I look at her labs.  History of stroke She has a history of stroke.  Continue risk factor modifications and she is on xarelto for her history of A. Fib.  She is also on ASA 59m daily.  Hypercholesterolemia She saw cardiology 3 weeks ago and they wanted to start her on zetia but she is not going to do this as she has had side effects from this medication in the past.  We will check her FLP today.  Statin intolerance She has tried multiple statins, zetia, welchol, and even praulent.  She had pancreatitis from praulent.  She does not want to be on nexletol due to worries about recurrent pancreatitis.  Headache She has post stroke headache.  I reviewed  her GFR.  We will increase her lyrica from 60m BID to 75 mg TID.  She probably has a vascular component to her headaches.  [facility].  I am going to try her on a trial of labetalol low dose to see if this helps with her  headaches.    Return in about 3 months (around 06/28/2022).   JTownsend Roger MD

## 2022-03-29 NOTE — Assessment & Plan Note (Signed)
Over the last year, her CKD has worsening.  Again, I want her to remain on off NSAIDS.  We will consider jardiance or farxia once I look at her labs.

## 2022-03-29 NOTE — Assessment & Plan Note (Signed)
She has diabetic nephropathy with Stage IIIb CKD.  Her diabetes has been diet controlled.   She is not on a ACE-I.  We can consider farxiga or even jardiance but we will wait to see what her labs are today.

## 2022-03-29 NOTE — Assessment & Plan Note (Signed)
She has post stroke headache.  I reviewed her GFR.  We will increase her lyrica from 75mg  BID to 75 mg TID.  She probably has a vascular component to her headaches.  She was given ox[facility]codone for her headaches at [facility] but I told her to stop this.  I am going to try her on a trial of labetalol low dose to see if this helps with her headaches.

## 2022-03-30 LAB — LIPID PANEL
Chol/HDL Ratio: 5.1 ratio — ABNORMAL HIGH (ref 0.0–4.4)
Cholesterol, Total: 214 mg/dL — ABNORMAL HIGH (ref 100–199)
HDL: 42 mg/dL (ref >39)
LDL Chol Calc (NIH): 138 mg/dL — ABNORMAL HIGH (ref 0–99)
Triglycerides: 191 mg/dL — ABNORMAL HIGH (ref 0–149)
VLDL Cholesterol Cal: 34 mg/dL (ref 5–40)

## 2022-03-30 LAB — CMP14 + ANION GAP
ALT: <5 IU/L (ref 0–32)
AST: 11 IU/L (ref 0–40)
Albumin/Globulin Ratio: 0.9 — ABNORMAL LOW (ref 1.2–2.2)
Albumin: 3.5 g/dL — ABNORMAL LOW (ref 3.8–4.8)
Alkaline Phosphatase: 97 IU/L (ref 44–121)
Anion Gap: 18 mmol/L (ref 10.0–18.0)
BUN/Creatinine Ratio: 11 — ABNORMAL LOW (ref 12–28)
BUN: 11 mg/dL (ref 8–27)
Bilirubin Total: 0.2 mg/dL (ref 0.0–1.2)
CO2: 17 mmol/L — ABNORMAL LOW (ref 20–29)
Calcium: 9.4 mg/dL (ref 8.7–10.3)
Chloride: 110 mmol/L — ABNORMAL HIGH (ref 96–106)
Creatinine, Ser: 1 mg/dL (ref 0.57–1.00)
Globulin, Total: 3.8 g/dL (ref 1.5–4.5)
Glucose: 104 mg/dL — ABNORMAL HIGH (ref 70–99)
Potassium: 3.8 mmol/L (ref 3.5–5.2)
Sodium: 145 mmol/L — ABNORMAL HIGH (ref 134–144)
Total Protein: 7.3 g/dL (ref 6.0–8.5)
eGFR: 58 mL/min/{1.73_m2} — ABNORMAL LOW (ref >59)

## 2022-03-30 LAB — HEMOGLOBIN A1C
Est. average glucose Bld gHb Est-mCnc: 123 mg/dL
Hgb A1c MFr Bld: 5.9 % — ABNORMAL HIGH (ref 4.8–5.6)

## 2022-04-02 ENCOUNTER — Ambulatory Visit: Payer: Self-pay

## 2022-04-02 ENCOUNTER — Other Ambulatory Visit: Payer: Self-pay

## 2022-04-02 MED ORDER — LABETALOL 40 MG/ML PEDIATRIC ORAL SUSPENSION: 25.0000 mg | Freq: Two times a day (BID) | ORAL | 2 refills | Status: DC

## 2022-04-02 NOTE — Patient Outreach (Signed)
  Care Coordination   Follow Up Visit Note   04/02/2022 Name: Jenny Kelly MRN: [id number] DOB: [birth year]  Jenny Kelly is a 76 y.o. year old female who sees Crist Fat, MD for primary care. I spoke with  Jake Seats by phone today.  What matters to the patients health and wellness today?  Placed call to patient to follow up.  Initial call from 12/2021.  Patient cancelled appointment 01/2022.  I called patient today and she reports that she continues to have headaches every day.  Reports pain comes and goes. Reports it feels like a band is tightened around her head.  Reports she takes tylenol but it does not really help.  Reports DM under good control.   Patient states that she never heard from [facility].  Recent follow up with PCP.     Goals Addressed               This Visit's Progress     Weakness and fear of falling (pt-stated)        Care Coordination Interventions: Reviewed with patient that she continues to have headaches daily. Offered to call [facility] neurology to follow up from referral placed by hospital 9/23.  Patient and husband state that PCP has Rx betablocker. Patient and husband wants to wait to try to betablocker before investigating follow up with [facility].  Reviewed s/s of stroke and when to call 911. Assessed pain.  Reviewed pending appointments and schedule next appointment with this case manager Provided my contact information to patient and husband.          SDOH assessments and interventions completed:  No     Care Coordination Interventions:  Yes, provided   Follow up plan: Follow up call scheduled for 05/07/2022    Encounter Outcome:  Pt. Visit Completed   Rowe Pavy, RN, BSN, CEN [facility] Coordinator [phone]

## 2022-04-10 NOTE — Progress Notes (Signed)
Pt notified of lab results

## 2022-04-11 DIAGNOSIS — H16001 Unspecified corneal ulcer, right eye: Secondary | ICD-10-CM | POA: Diagnosis not present

## 2022-04-20 ENCOUNTER — Other Ambulatory Visit: Payer: Self-pay

## 2022-04-20 DIAGNOSIS — H16001 Unspecified corneal ulcer, right eye: Secondary | ICD-10-CM | POA: Diagnosis not present

## 2022-04-20 MED ORDER — LABETALOL HCL 100 MG PO TABS: 50.0000 mg | ORAL_TABLET | Freq: Two times a day (BID) | ORAL | 1 refills | Status: DC

## 2022-05-01 ENCOUNTER — Other Ambulatory Visit: Payer: Self-pay

## 2022-05-01 MED ORDER — TOPIRAMATE 200 MG PO TABS: 200.0000 mg | ORAL_TABLET | Freq: Two times a day (BID) | ORAL | 0 refills | Status: DC

## 2022-05-07 ENCOUNTER — Ambulatory Visit: Payer: Self-pay

## 2022-05-07 NOTE — Patient Outreach (Signed)
  Care Coordination   Follow Up Visit Note   05/07/2022 Name: Jenny Kelly MRN: [id number] DOB: 08/09/1945  Jenny Kelly is a 77 y.o. year old female who sees Townsend Roger, MD for primary care. I spoke with  [name] by phone today.  What matters to the patients health and wellness today?  Placed call to patient today to follow up. Patient reports that she continues to have severe headaches several days a week. Unable to place a number on her pain level today.  States that betablocker is not  helping.   Reviewed ability to follow up at [facility] neurology and patient and husband decline.    Goals Addressed               This Visit's Progress     Weakness and fear of falling (pt-stated)        Care Coordination Interventions: Reviewed with patient that she continues to have headaches daily. Offered to call [facility] neurology to follow up from referral placed by hospital 9/23.  Patient and husband decline assistance with follow up at [facility].   Reviewed s/s of stroke and when to call 911. Assessed pain.  Unable to place number on pain today Reviewed pending appointments and schedule next appointment with this case manager and PCP Provided my contact information to patient and husband.  Follow up planned for 1 month.         SDOH assessments and interventions completed:  No     Care Coordination Interventions:  Yes, provided   Follow up plan: Follow up call scheduled for 02/151/2024    Encounter Outcome:  Pt. Visit Completed   Tomasa Rand, RN, BSN, CEN [place] Coordinator [phone]

## 2022-05-18 DIAGNOSIS — H16001 Unspecified corneal ulcer, right eye: Secondary | ICD-10-CM | POA: Diagnosis not present

## 2022-06-07 ENCOUNTER — Ambulatory Visit: Payer: Self-pay

## 2022-06-07 NOTE — Patient Outreach (Signed)
  Care Coordination   Follow Up Visit Note   06/07/2022 Name: Jenny Kelly MRN: [id number] DOB: 1946-03-29  Jenny Kelly is a 77 y.o. year old female who sees Townsend Roger, MD for primary care. I spoke with  [name] by phone today.  What matters to the patients health and wellness today?  Placed call to patient today, husband answered and states that today is not a good day to talk.  Appointment rescheduled.     SDOH assessments and interventions completed:  No     Care Coordination Interventions:  No, not indicated   Follow up plan: Follow up call scheduled for 06/12/2022    Encounter Outcome:  Pt. Visit Completed   Tomasa Rand, RN, BSN, CEN [place] Coordinator [phone]

## 2022-06-11 ENCOUNTER — Ambulatory Visit: Payer: Self-pay

## 2022-06-11 NOTE — Patient Outreach (Signed)
  Care Coordination   Follow Up Visit Note   06/11/2022 Name: Jenny Kelly MRN: [id number] DOB: [birth year]  Jenny Kelly is a 77 y.o. year old female who sees Townsend Roger, MD for primary care. I spoke with  Jenny Kelly by phone today.  What matters to the patients health and wellness today?  Placed call to patient to review progress.   Patient reports that she continues to have headaches everyday.  Patient reports that the betablocker did not work. Patient states that she is not planning to go to [facility] for evaluation due to the distance and she does not drive.  Patient reports to me that she plans to talk to her PCP at next office visit.      Goals Addressed               This Visit's Progress     COMPLETED: Weakness and fear of falling (pt-stated)        Care Coordination Interventions: Reviewed frequency of headaches  ( daily)  Reviewed response from betablocker. Reviewed interest in going to [facility] neurology  ( no interest)  Reviewed pending follow up appointments.  Denies additional concerns.          SDOH assessments and interventions completed:  No     Care Coordination Interventions:  Yes, provided   Follow up plan: No further intervention required.   Encounter Outcome:  Pt. Visit Completed   Tomasa Rand, RN, BSN, CEN [place] Coordinator [phone]

## 2022-06-16 ENCOUNTER — Emergency Department (HOSPITAL_COMMUNITY): Payer: Medicare HMO

## 2022-06-16 ENCOUNTER — Other Ambulatory Visit: Payer: Self-pay

## 2022-06-16 ENCOUNTER — Encounter (HOSPITAL_COMMUNITY): Payer: Self-pay

## 2022-06-16 ENCOUNTER — Inpatient Hospital Stay (HOSPITAL_COMMUNITY)
Admission: EM | Admit: 2022-06-16 | Discharge: 2022-06-18 | DRG: 392 | Disposition: A | Discharge status: Home Health | Payer: Medicare HMO | Attending: Internal Medicine | Admitting: Internal Medicine

## 2022-06-16 DIAGNOSIS — I480 Paroxysmal atrial fibrillation: Secondary | ICD-10-CM | POA: Diagnosis not present

## 2022-06-16 DIAGNOSIS — Z951 Presence of aortocoronary bypass graft: Secondary | ICD-10-CM | POA: Diagnosis not present

## 2022-06-16 DIAGNOSIS — J9610 Chronic respiratory failure, unspecified whether with hypoxia or hypercapnia: Secondary | ICD-10-CM | POA: Diagnosis not present

## 2022-06-16 DIAGNOSIS — F0390 Unspecified dementia without behavioral disturbance: Secondary | ICD-10-CM | POA: Diagnosis present

## 2022-06-16 DIAGNOSIS — I63311 Cerebral infarction due to thrombosis of right middle cerebral artery: Secondary | ICD-10-CM

## 2022-06-16 DIAGNOSIS — J449 Chronic obstructive pulmonary disease, unspecified: Secondary | ICD-10-CM | POA: Diagnosis present

## 2022-06-16 DIAGNOSIS — E785 Hyperlipidemia, unspecified: Secondary | ICD-10-CM | POA: Diagnosis present

## 2022-06-16 DIAGNOSIS — E1122 Type 2 diabetes mellitus with diabetic chronic kidney disease: Secondary | ICD-10-CM | POA: Diagnosis not present

## 2022-06-16 DIAGNOSIS — N1831 Chronic kidney disease, stage 3a: Secondary | ICD-10-CM | POA: Diagnosis present

## 2022-06-16 DIAGNOSIS — R197 Diarrhea, unspecified: Secondary | ICD-10-CM | POA: Diagnosis present

## 2022-06-16 DIAGNOSIS — Z66 Do not resuscitate: Secondary | ICD-10-CM | POA: Diagnosis not present

## 2022-06-16 DIAGNOSIS — G40909 Epilepsy, unspecified, not intractable, without status epilepticus: Secondary | ICD-10-CM | POA: Diagnosis present

## 2022-06-16 DIAGNOSIS — Z8673 Personal history of transient ischemic attack (TIA), and cerebral infarction without residual deficits: Secondary | ICD-10-CM | POA: Diagnosis not present

## 2022-06-16 DIAGNOSIS — Z7982 Long term (current) use of aspirin: Secondary | ICD-10-CM

## 2022-06-16 DIAGNOSIS — E039 Hypothyroidism, unspecified: Secondary | ICD-10-CM | POA: Diagnosis present

## 2022-06-16 DIAGNOSIS — E876 Hypokalemia: Secondary | ICD-10-CM | POA: Diagnosis not present

## 2022-06-16 DIAGNOSIS — J8410 Pulmonary fibrosis, unspecified: Secondary | ICD-10-CM | POA: Diagnosis present

## 2022-06-16 DIAGNOSIS — Z87891 Personal history of nicotine dependence: Secondary | ICD-10-CM | POA: Diagnosis not present

## 2022-06-16 DIAGNOSIS — M19071 Primary osteoarthritis, right ankle and foot: Secondary | ICD-10-CM | POA: Diagnosis not present

## 2022-06-16 DIAGNOSIS — Z8249 Family history of ischemic heart disease and other diseases of the circulatory system: Secondary | ICD-10-CM

## 2022-06-16 DIAGNOSIS — I509 Heart failure, unspecified: Secondary | ICD-10-CM | POA: Diagnosis present

## 2022-06-16 DIAGNOSIS — I252 Old myocardial infarction: Secondary | ICD-10-CM

## 2022-06-16 DIAGNOSIS — Z79890 Hormone replacement therapy (postmenopausal): Secondary | ICD-10-CM

## 2022-06-16 DIAGNOSIS — A084 Viral intestinal infection, unspecified: Secondary | ICD-10-CM | POA: Diagnosis not present

## 2022-06-16 DIAGNOSIS — X58XXXA Exposure to other specified factors, initial encounter: Secondary | ICD-10-CM | POA: Diagnosis present

## 2022-06-16 DIAGNOSIS — J4489 Other specified chronic obstructive pulmonary disease: Secondary | ICD-10-CM | POA: Diagnosis present

## 2022-06-16 DIAGNOSIS — I69351 Hemiplegia and hemiparesis following cerebral infarction affecting right dominant side: Secondary | ICD-10-CM

## 2022-06-16 DIAGNOSIS — E860 Dehydration: Secondary | ICD-10-CM | POA: Diagnosis not present

## 2022-06-16 DIAGNOSIS — S30810A Abrasion of lower back and pelvis, initial encounter: Secondary | ICD-10-CM | POA: Diagnosis present

## 2022-06-16 DIAGNOSIS — I2510 Atherosclerotic heart disease of native coronary artery without angina pectoris: Secondary | ICD-10-CM | POA: Diagnosis present

## 2022-06-16 DIAGNOSIS — R531 Weakness: Secondary | ICD-10-CM | POA: Diagnosis not present

## 2022-06-16 DIAGNOSIS — Z79899 Other long term (current) drug therapy: Secondary | ICD-10-CM

## 2022-06-16 DIAGNOSIS — M79671 Pain in right foot: Secondary | ICD-10-CM | POA: Diagnosis present

## 2022-06-16 DIAGNOSIS — Z8616 Personal history of COVID-19: Secondary | ICD-10-CM | POA: Diagnosis not present

## 2022-06-16 DIAGNOSIS — Z8701 Personal history of pneumonia (recurrent): Secondary | ICD-10-CM | POA: Diagnosis not present

## 2022-06-16 DIAGNOSIS — K219 Gastro-esophageal reflux disease without esophagitis: Secondary | ICD-10-CM | POA: Diagnosis present

## 2022-06-16 DIAGNOSIS — Z803 Family history of malignant neoplasm of breast: Secondary | ICD-10-CM

## 2022-06-16 DIAGNOSIS — Z90710 Acquired absence of both cervix and uterus: Secondary | ICD-10-CM

## 2022-06-16 DIAGNOSIS — Z7901 Long term (current) use of anticoagulants: Secondary | ICD-10-CM

## 2022-06-16 DIAGNOSIS — R262 Difficulty in walking, not elsewhere classified: Secondary | ICD-10-CM | POA: Diagnosis present

## 2022-06-16 DIAGNOSIS — Z9049 Acquired absence of other specified parts of digestive tract: Secondary | ICD-10-CM

## 2022-06-16 DIAGNOSIS — Z87442 Personal history of urinary calculi: Secondary | ICD-10-CM

## 2022-06-16 LAB — COMPREHENSIVE METABOLIC PANEL
ALT: 8 U/L (ref 0–44)
AST: 17 U/L (ref 15–41)
Albumin: 2.6 g/dL — ABNORMAL LOW (ref 3.5–5.0)
Alkaline Phosphatase: 89 U/L (ref 38–126)
Anion gap: 12 (ref 5–15)
BUN: 18 mg/dL (ref 8–23)
CO2: 20 mmol/L — ABNORMAL LOW (ref 22–32)
Calcium: 8.7 mg/dL — ABNORMAL LOW (ref 8.9–10.3)
Chloride: 108 mmol/L (ref 98–111)
Creatinine, Ser: 1.05 mg/dL — ABNORMAL HIGH (ref 0.44–1.00)
GFR, Estimated: 55 mL/min — ABNORMAL LOW (ref >60)
Glucose, Bld: 135 mg/dL — ABNORMAL HIGH (ref 70–99)
Potassium: 2.1 mmol/L — CL (ref 3.5–5.1)
Sodium: 140 mmol/L (ref 135–145)
Total Bilirubin: 0.6 mg/dL (ref 0.3–1.2)
Total Protein: 7.1 g/dL (ref 6.5–8.1)

## 2022-06-16 LAB — CBC WITH DIFFERENTIAL/PLATELET
Abs Immature Granulocytes: 0.09 10*3/uL — ABNORMAL HIGH (ref 0.00–0.07)
Basophils Absolute: 0.1 10*3/uL (ref 0.0–0.1)
Basophils Relative: 1 %
Eosinophils Absolute: 0.2 10*3/uL (ref 0.0–0.5)
Eosinophils Relative: 1 %
HCT: 41.4 % (ref 36.0–46.0)
Hemoglobin: 12.6 g/dL (ref 12.0–15.0)
Immature Granulocytes: 1 %
Lymphocytes Relative: 18 %
Lymphs Abs: 2.6 10*3/uL (ref 0.7–4.0)
MCH: 25.6 pg — ABNORMAL LOW (ref 26.0–34.0)
MCHC: 30.4 g/dL (ref 30.0–36.0)
MCV: 84.1 fL (ref 80.0–100.0)
Monocytes Absolute: 1.2 10*3/uL — ABNORMAL HIGH (ref 0.1–1.0)
Monocytes Relative: 8 %
Neutro Abs: 10.1 10*3/uL — ABNORMAL HIGH (ref 1.7–7.7)
Neutrophils Relative %: 71 %
Platelets: 324 10*3/uL (ref 150–400)
RBC: 4.92 MIL/uL (ref 3.87–5.11)
RDW: 18.2 % — ABNORMAL HIGH (ref 11.5–15.5)
WBC: 14.3 10*3/uL — ABNORMAL HIGH (ref 4.0–10.5)
nRBC: 0 % (ref 0.0–0.2)

## 2022-06-16 LAB — MAGNESIUM: Magnesium: 1.7 mg/dL (ref 1.7–2.4)

## 2022-06-16 LAB — C DIFFICILE QUICK SCREEN W PCR REFLEX
C Diff antigen: NEGATIVE
C Diff interpretation: NOT DETECTED
C Diff toxin: NEGATIVE

## 2022-06-16 MED ORDER — TRIMETHOBENZAMIDE HCL 100 MG/ML IM SOLN: 200.0000 mg | Freq: Four times a day (QID) | INTRAMUSCULAR | Status: DC | PRN

## 2022-06-16 MED ORDER — POTASSIUM CHLORIDE 2 MEQ/ML IV SOLN
INTRAVENOUS | Status: DC
  Filled 2022-06-16: qty 1000

## 2022-06-16 MED ORDER — ACETAMINOPHEN 325 MG PO TABS
650.0000 mg | ORAL_TABLET | Freq: Four times a day (QID) | ORAL | Status: DC | PRN
  Administered 2022-06-16 – 2022-06-18 (×3): 650 mg via ORAL
  Filled 2022-06-16 (×3): qty 2

## 2022-06-16 MED ORDER — MAGNESIUM SULFATE IN D5W 1-5 GM/100ML-% IV SOLN
1.0000 g | Freq: Once | INTRAVENOUS | Status: AC
  Administered 2022-06-16: 1 g via INTRAVENOUS
  Filled 2022-06-16: qty 100

## 2022-06-16 MED ORDER — PREGABALIN 25 MG PO CAPS
75.0000 mg | ORAL_CAPSULE | Freq: Three times a day (TID) | ORAL | Status: DC
  Administered 2022-06-16 – 2022-06-18 (×5): 75 mg via ORAL
  Filled 2022-06-16 (×5): qty 3

## 2022-06-16 MED ORDER — POTASSIUM CHLORIDE 2 MEQ/ML IV SOLN: INTRAVENOUS | Status: DC

## 2022-06-16 MED ORDER — LEVOTHYROXINE SODIUM 88 MCG PO TABS
88.0000 ug | ORAL_TABLET | Freq: Every day | ORAL | Status: DC
  Administered 2022-06-17 – 2022-06-18 (×2): 88 ug via ORAL
  Filled 2022-06-16 (×2): qty 1

## 2022-06-16 MED ORDER — ACETAMINOPHEN 650 MG RE SUPP: 650.0000 mg | Freq: Four times a day (QID) | RECTAL | Status: DC | PRN

## 2022-06-16 MED ORDER — POTASSIUM CHLORIDE CRYS ER 20 MEQ PO TBCR: 40.0000 meq | EXTENDED_RELEASE_TABLET | ORAL | Status: DC

## 2022-06-16 MED ORDER — TOPIRAMATE 100 MG PO TABS
200.0000 mg | ORAL_TABLET | Freq: Two times a day (BID) | ORAL | Status: DC
  Administered 2022-06-16 – 2022-06-18 (×4): 200 mg via ORAL
  Filled 2022-06-16 (×4): qty 2
  Filled 2022-06-16: qty 8
  Filled 2022-06-16 (×3): qty 2
  Filled 2022-06-16: qty 8

## 2022-06-16 MED ORDER — ACETAMINOPHEN 500 MG PO TABS
1000.0000 mg | ORAL_TABLET | Freq: Once | ORAL | Status: AC
  Administered 2022-06-16: 1000 mg via ORAL
  Filled 2022-06-16: qty 2

## 2022-06-16 MED ORDER — LACTATED RINGERS IV BOLUS
1000.0000 mL | Freq: Once | INTRAVENOUS | Status: AC
  Administered 2022-06-16: 1000 mL via INTRAVENOUS

## 2022-06-16 MED ORDER — POTASSIUM CHLORIDE 2 MEQ/ML IV SOLN
INTRAVENOUS | Status: DC
  Filled 2022-06-16 (×3): qty 1000

## 2022-06-16 MED ORDER — RIVAROXABAN 15 MG PO TABS
15.0000 mg | ORAL_TABLET | Freq: Every day | ORAL | Status: DC
  Administered 2022-06-16 – 2022-06-17 (×2): 15 mg via ORAL
  Filled 2022-06-16 (×2): qty 1

## 2022-06-16 MED ORDER — FLORANEX PO PACK
1.0000 g | PACK | Freq: Three times a day (TID) | ORAL | Status: DC
  Administered 2022-06-17 – 2022-06-18 (×5): 1 g via ORAL
  Filled 2022-06-16 (×7): qty 1

## 2022-06-16 MED ORDER — POTASSIUM CHLORIDE 10 MEQ/100ML IV SOLN: 10.0000 meq | Freq: Once | INTRAVENOUS | Status: DC

## 2022-06-16 MED ORDER — POTASSIUM CHLORIDE CRYS ER 20 MEQ PO TBCR
40.0000 meq | EXTENDED_RELEASE_TABLET | Freq: Once | ORAL | Status: AC
  Administered 2022-06-16: 40 meq via ORAL
  Filled 2022-06-16: qty 2

## 2022-06-16 NOTE — ED Triage Notes (Signed)
Pt to the ed from home via ems with a CC of diarrhea x 1.5 pt relays increased weakness and has been unable to walk x 2 days. Pt relays some nausea with out vomiting. Pt denies fever/chills. Pt relays slight abd pain

## 2022-06-16 NOTE — H&P (Addendum)
PCP:   Townsend Roger, MD   Chief Complaint:  Weakness  HPI: This is a 77 year old female with PMHx of A. fib (on xarelto), CKD stage 3a, COPD, pulmonary fibrosis, seizure disorder, h/o CVA w/ residual right sided weakness, hx of cluster HA and migraines.  Patient presents due to diarrhea and weakness.  She has had diarrhea approximately a week, she has not been able to keep food down but she has been able to keep fluids down.  She has continued to take her medications.  She has had no appetite due to nausea.  She has developed weakness that has been progressive.  Today she was unable to get out of bed d/t her weakness.  She denies being lightheaded or dizzy.  She denies fevers or chills.  She denies abdominal pain or blood in her stool.  No one at home has diarrhea.  They denied any antibiotic use in the last 2 to 3 months.  Prior to come the ER she had no pain however, here in the ER she some abdominal discomfort, around her umbilicus.  Currently she states her pain is 0/10.  She also complains of pain in her right heel and the lateral portion of her right foot.  History provided by patient and her husband was present at bedside.  Review of Systems:  The patient denies anorexia, fever, weight loss,, vision loss, decreased hearing, hoarseness, chest pain, syncope, dyspnea on exertion, peripheral edema, balance deficits, hemoptysis, abdominal pain, melena, hematochezia, severe indigestion/heartburn, hematuria, incontinence, genital sores, muscle weakness, suspicious skin lesions, transient blindness, difficulty walking, depression, unusual weight change, abnormal bleeding, enlarged lymph nodes, angioedema, and breast masses.  Past Medical History: Past Medical History:  Diagnosis Date   Acquired asymmetrical kidneys 06/21/2015   Formatting of this note might be different from the original. 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   Allergy 06/09/2021   Anemia     Anxiety    Arthritis    Asthma    Atrial fibrillation ([place])    Blood transfusion without reported diagnosis    Cataract    CHF (congestive heart failure) (HCC)    Chronic kidney disease    Clotting disorder (HCC)    COPD (chronic obstructive pulmonary disease) (HCC)    Dementia (HCC)    Depression    Diabetes mellitus without complication (HCC)    Dyspnea    GERD (gastroesophageal reflux disease)    Great toe pain, left 11/01/2021   Headache    Heart murmur    History of CVA with residual deficit 11/01/2021   History of kidney stones    Hyperlipidemia    Hypothyroidism    Myocardial infarction ([place])    Neuromuscular disorder ([place])    tremors   Pneumonia due to COVID-19 virus    Postoperative wound infection 03/10/2014   Seizures ([place])    Sleep apnea    Stroke (HCC)    Tension-type headache, not intractable 01/10/2016   Thyroid disease    Wide-complex tachycardia    Past Surgical History:  Procedure Laterality Date   ABDOMINAL HYSTERECTOMY     APPENDECTOMY     bladder tack     CHOLECYSTECTOMY     CORONARY ARTERY BYPASS GRAFT     CYSTOSCOPY W/ URETERAL STENT PLACEMENT Left 05/01/2021   Procedure: CYSTOSCOPY WITH RETROGRADE PYELOGRAM/URETERAL STENT PLACEMENT;  Surgeon: Ceasar Mons, MD;  Location: [facility];  Service: Urology;  Laterality: Left;  CYSTOSCOPY/URETEROSCOPY/HOLMIUM LASER/STENT PLACEMENT Left 08/09/2021   Procedure: CYSTOSCOPY/URETEROSCOPY/HOLMIUM LASER/STENT EXCHANGE;  Surgeon: Ceasar Mons, MD;  Location: [facility];  Service: Urology;  Laterality: Left;   ESOPHAGOGASTRODUODENOSCOPY ENDOSCOPY     EYE SURGERY     FRACTURE SURGERY Right    3rd finger   IR FLUORO GUIDED NEEDLE PLC ASPIRATION/INJECTION LOC  12/27/2021   IR FLUORO GUIDED NEEDLE PLC ASPIRATION/INJECTION LOC  01/01/2022   JOINT REPLACEMENT Bilateral    knees   TONSILLECTOMY AND ADENOIDECTOMY     TUBAL LIGATION      Medications: Prior to Admission medications   Medication  Sig Start Date End Date Taking? Authorizing Provider  labetalol (NORMODYNE) 100 MG tablet Take 0.5 tablets (50 mg total) by mouth 2 (two) times daily. 04/20/22   Townsend Roger, MD  acetaminophen (TYLENOL) 500 MG tablet Take 500 mg by mouth every 6 (six) hours as needed for mild pain or moderate pain.    [provider]  aspirin 81 MG EC tablet Take 1 tablet (81 mg total) by mouth daily. Swallow whole. 05/16/21   Love, Ivan Anchors, PA-C  cetirizine (ZYRTEC) 10 MG tablet Take 10 mg by mouth daily.    [provider]  docusate sodium (COLACE) 100 MG capsule Take 100 mg by mouth daily as needed for mild constipation or moderate constipation.    [provider]  donepezil (ARICEPT) 10 MG tablet Take 1 tablet (10 mg total) by mouth at bedtime. 05/16/21   Love, Ivan Anchors, PA-C  guaiFENesin-dextromethorphan (ROBITUSSIN DM) 100-10 MG/5ML syrup Take 10 mLs by mouth every 4 (four) hours as needed for cough. 11/04/21   Antonieta Pert, MD  levothyroxine (SYNTHROID) 88 MCG tablet Take 88 mcg by mouth daily before breakfast.    [provider]  montelukast (SINGULAIR) 10 MG tablet Take 1 tablet (10 mg total) by mouth at bedtime. 05/16/21   Love, Ivan Anchors, PA-C  Multiple Vitamin (MULTIVITAMIN WITH MINERALS) TABS tablet Take 1 tablet by mouth daily. 05/17/21   Love, Ivan Anchors, PA-C  omeprazole (PRILOSEC) 40 MG capsule Take 1 capsule (40 mg total) by mouth daily. Patient taking differently: Take 40 mg by mouth 2 (two) times daily. 05/16/21   Love, Ivan Anchors, PA-C  pregabalin (LYRICA) 75 MG capsule Take 1 capsule (75 mg total) by mouth 3 (three) times daily. 03/29/22   Townsend Roger, MD  Probiotic Product (PROBIOTIC PO) Take 15 Billion Cells by mouth daily.    [provider]  Rivaroxaban (XARELTO) 15 MG TABS tablet Take 1 tablet (15 mg total) by mouth daily. 09/15/21   Park Liter, MD  topiramate (TOPAMAX) 200 MG tablet Take 1 tablet (200 mg total) by mouth 2 (two) times daily.  05/01/22   Townsend Roger, MD  URINARY HEALTH/CRANBERRY PO Take 1 tablet by mouth 2 (two) times daily.    [provider]    Allergies:   Allergies  Allergen Reactions   Ciprofloxacin Hives    Ask patient   Prochlorperazine Edisylate Other (See Comments)    Cant swallow Muscle cramping   Morphine Other (See Comments)    Confusion   Prednisone Other (See Comments)    unknown   Reduced Iso-Alpha Acids Complex Other (See Comments)    unknown   Statins Other (See Comments)    Muscle weakness    Sulfa Antibiotics Swelling   Azithromycin Rash   Latex Rash    Social History:  reports that she quit smoking about 21 years ago.  Her smoking use included cigarettes. She has a 15.00 pack-year smoking history. She has never used smokeless tobacco. She reports that she does not currently use alcohol. She reports that she does not use drugs.  Family History: Family History  Problem Relation Age of Onset   Heart disease Mother    Heart attack Mother    Breast cancer Mother    Heart disease Sister     Physical Exam: Vitals:   06/16/22 1424 06/16/22 1500 06/16/22 1730 06/16/22 1900  BP: 137/69 (!) 147/93 (!) 131/100 137/87  Pulse: 80 77 70 86  Resp: '16 19 18 18  '$ Temp: 98.6 F (37 C)  98.5 F (36.9 C)   TempSrc: Oral  Oral   SpO2: 99% 100% 98% 95%  Weight: 53.5 kg  53 kg   Height: '5\' 2"'$  (1.575 m)  '5\' 2"'$  (1.575 m)     General:  Alert and oriented times three, well developed and nourished, no acute distress Eyes: PERRLA, pink conjunctiva, no scleral icterus ENT: Dry oral mucosa, neck supple, no thyromegaly Lungs: clear to ascultation, no wheeze, no crackles, no use of accessory muscles Cardiovascular: irregular rate and irregular rhythm, no regurgitation, no gallops, no murmurs. No carotid bruits, no JVD Abdomen: soft, positive BS, non-tender, non-distended, no organomegaly, not an acute abdomen GU: not examined Neuro: CN II - XII grossly intact, sensation  intact Musculoskeletal: Pronator drift of the left upper extremity. Left lower extremity 4/5.  Right-sided strength 5/5  Skin: no rash, no subcutaneous crepitation, buttock excoriated Psych: appropriate patient   Labs on Admission:  Recent Labs    06/16/22 1506  NA 140  K 2.1*  CL 108  CO2 20*  GLUCOSE 135*  BUN 18  CREATININE 1.05*  CALCIUM 8.7*  MG 1.7   Recent Labs    06/16/22 1506  AST 17  ALT 8  ALKPHOS 89  BILITOT 0.6  PROT 7.1  ALBUMIN 2.6*    Recent Labs    06/16/22 1506  WBC 14.3*  NEUTROABS 10.1*  HGB 12.6  HCT 41.4  MCV 84.1  PLT 324     Radiological Exams on Admission: DG Foot 2 Views Right  Result Date: 06/16/2022 CLINICAL DATA:  5th metatarsal pain EXAM: RIGHT FOOT - 2 VIEW COMPARISON:  None Available. FINDINGS: No acute fracture or dislocation. Severe osteoarthritis of the first MTP joint. Mild-to-moderate degenerative changes within the midfoot. Plantar calcaneal spur. There is mild soft tissue prominence lateral to the fifth MTP joint, nonspecific. IMPRESSION: 1. No acute osseous abnormality. 2. Severe osteoarthritis of the first MTP joint. 3. Mild soft tissue prominence lateral to the fifth MTP joint, nonspecific. Electronically Signed   By: Davina Poke D.O.   On: 06/16/2022 16:42   DG Ankle Complete Right  Result Date: 06/16/2022 CLINICAL DATA:  Pain EXAM: RIGHT ANKLE - COMPLETE 3 VIEW COMPARISON:  None Available. FINDINGS: No acute fracture, dislocation or subluxation. Soft tissues are unremarkable. There is a plantar calcaneal spur. Ankle mortise Joint space narrowing. IMPRESSION: Osteoarthritis of the ankle mortise. Plantar calcaneal spur. No acute osseous abnormalities. Electronically Signed   By: Sammie Bench M.D.   On: 06/16/2022 16:15    Assessment/Plan Present on Admission:  Hypokalemia/ Diarrhea/ Dehydration -LR w/ 23mq K x 5hrs, followed by LR w/ 248m K '@100cc'$ /hr -BMP, mag level in a.m. -C. difficile, GI panel ordered.   Enteric precautions -Lactobacillus ordered   Prolonged QTc 537 -Repleting potassium and magnesium. -Repeat EKG ordered for a.m. -Avoid QTc prolongation agents  Weak/ Deconditioned -PT consult placed   Dementia without behavioral disturbance ([place]) -Aricept resumed   CAD -On aspirin, Xarelto.   -Patient is statin intolerant.  Does not take Zetia recently prescribed d/t past SE   Hypothyroidism -Stable, levothyroxine resumed   PAF (paroxysmal atrial fibrillation) (HCC) -On Xarelto and aspirin.  On no medication for rate control.   -Was on amiodarone, discontinued in January 2023.   History of CVA -Residual L UE weakness, some mild residual L LE weakness -On Xarelto and aspirin.  No statin.  Has not tolerated any cholesterol medications -Speech recommended dysphagia 2 [mins] diet with thin liquids due to impaired mastication but limited d/t dentition/patient preference.   Seizure -Continue Topamax   Poststroke headaches -Continue Lyrica.  Labetalol 50 mg BiD recently added, patient not taking -Patient currently complains of HA.  Reglan 5 mg, Benadryl 12.5 mg / 2 g IV mag ordered -On previous admission significant concern and workup for patient's headache.  Blood patch done, migraine cocktail done, workup for GCA.  Temporal artery biopsy planned but was discontinued.  Patient was to [facility] at [facility].    Chronic pressure failure  COPD/Pulmonary fibrosis -PRN oxygen ordered    Sacral excoriation -Secondary to diarrhea.  Gerhardt's butt cream ordered  Hussam Muniz 06/16/2022, 7:46 PM

## 2022-06-16 NOTE — ED Provider Notes (Signed)
[facility] Provider Note   CSN: [id number] Arrival date & time: 06/16/22  1417     History  Chief Complaint  Patient presents with   Diarrhea    Jenny Kelly is a 77 y.o. female.  This is a 77 year old female with history of diabetes, hypothyroidism, CAD, paroxysmal A-fib on Xarelto, CHF, stage III kidney disease, COPD and stroke presenting to the ED for diarrhea.  States she has had watery diarrhea for the last 1.5 weeks.  She also endorses some intermittent nausea and vomiting, denies any abdominal pain, chest pain, shortness of breath.  She endorses difficulty walking over the last couple days due to right ankle pain.  Denies any trauma or injuries.  No recent antibiotics, no recent hospitalizations, no new medication changes.  She has been compliant with all her medications including her Xarelto.  Endorses adequate fluid intake but has had decreased appetite.  Patient is complaining of mild headache which just started in the ED.    Home Medications Prior to Admission medications   Medication Sig Start Date End Date Taking? Authorizing Provider  labetalol (NORMODYNE) 100 MG tablet Take 0.5 tablets (50 mg total) by mouth 2 (two) times daily. 04/20/22   Townsend Roger, MD  acetaminophen (TYLENOL) 500 MG tablet Take 500 mg by mouth every 6 (six) hours as needed for mild pain or moderate pain.    [provider]  aspirin 81 MG EC tablet Take 1 tablet (81 mg total) by mouth daily. Swallow whole. 05/16/21   Love, Ivan Anchors, PA-C  cetirizine (ZYRTEC) 10 MG tablet Take 10 mg by mouth daily.    [provider]  docusate sodium (COLACE) 100 MG capsule Take 100 mg by mouth daily as needed for mild constipation or moderate constipation.    [provider]  donepezil (ARICEPT) 10 MG tablet Take 1 tablet (10 mg total) by mouth at bedtime. 05/16/21   Love, Ivan Anchors, PA-C  guaiFENesin-dextromethorphan (ROBITUSSIN DM) 100-10  MG/5ML syrup Take 10 mLs by mouth every 4 (four) hours as needed for cough. 11/04/21   Antonieta Pert, MD  levothyroxine (SYNTHROID) 88 MCG tablet Take 88 mcg by mouth daily before breakfast.    [provider]  montelukast (SINGULAIR) 10 MG tablet Take 1 tablet (10 mg total) by mouth at bedtime. 05/16/21   Love, Ivan Anchors, PA-C  Multiple Vitamin (MULTIVITAMIN WITH MINERALS) TABS tablet Take 1 tablet by mouth daily. 05/17/21   Love, Ivan Anchors, PA-C  omeprazole (PRILOSEC) 40 MG capsule Take 1 capsule (40 mg total) by mouth daily. Patient taking differently: Take 40 mg by mouth 2 (two) times daily. 05/16/21   Love, Ivan Anchors, PA-C  pregabalin (LYRICA) 75 MG capsule Take 1 capsule (75 mg total) by mouth 3 (three) times daily. 03/29/22   Townsend Roger, MD  Probiotic Product (PROBIOTIC PO) Take 15 Billion Cells by mouth daily.    [provider]  Rivaroxaban (XARELTO) 15 MG TABS tablet Take 1 tablet (15 mg total) by mouth daily. 09/15/21   Park Liter, MD  topiramate (TOPAMAX) 200 MG tablet Take 1 tablet (200 mg total) by mouth 2 (two) times daily. 05/01/22   Townsend Roger, MD  URINARY HEALTH/CRANBERRY PO Take 1 tablet by mouth 2 (two) times daily.    [provider]      Allergies    Ciprofloxacin, Prochlorperazine edisylate, Morphine, Prednisone, Reduced iso-alpha acids complex, Statins, Sulfa antibiotics, Azithromycin, and Latex  Review of Systems   Review of Systems  Constitutional:  Positive for appetite change and fatigue. Negative for chills and fever.  Gastrointestinal:  Positive for diarrhea, nausea and vomiting. Negative for abdominal pain.  Neurological:  Negative for syncope and weakness.    Physical Exam Updated Vital Signs BP 137/69 (BP Location: Right Arm)   Pulse 80   Temp 98.6 F (37 C) (Oral)   Resp 16   Ht '5\' 2"'$  (1.575 m)   Wt 53.5 kg   SpO2 99%   BMI 21.58 kg/m  Physical Exam Vitals and nursing note reviewed.  Constitutional:      General:  She is not in acute distress.    Appearance: She is well-developed.  HENT:     Head: Normocephalic and atraumatic.     Mouth/Throat:     Mouth: Mucous membranes are moist.  Eyes:     Conjunctiva/sclera: Conjunctivae normal.  Cardiovascular:     Rate and Rhythm: Normal rate and regular rhythm.     Heart sounds: No murmur heard. Pulmonary:     Effort: Pulmonary effort is normal. No respiratory distress.     Breath sounds: Normal breath sounds.  Abdominal:     General: There is no distension.     Palpations: Abdomen is soft.     Tenderness: There is no abdominal tenderness. There is no guarding or rebound.  Musculoskeletal:        General: No swelling.     Cervical back: Neck supple.  Skin:    General: Skin is warm and dry.     Capillary Refill: Capillary refill takes less than 2 seconds.  Neurological:     General: No focal deficit present.     Mental Status: She is alert and oriented to person, place, and time.  Psychiatric:        Mood and Affect: Mood normal.     ED Results / Procedures / Treatments   Labs (all labs ordered are listed, but only abnormal results are displayed) Labs Reviewed - No data to display  EKG None  Radiology No results found.  Procedures Procedures    Medications Ordered in ED Medications - No data to display  ED Course/ Medical Decision Making/ A&P                             Medical Decision Making Patient presents with diarrhea, she is overall very well-appearing and in no acute distress.  Vitals are stable, she has no evidence of tachycardia and her blood pressure is normal.  She is afebrile and without abdominal pain, no recent hospitalization, no recent biotics, no history of CHF, we will obtain a C. difficile PCR although I have a low suspicion for this.  Her abdominal exam is completely benign, she has no tenderness at McBurney's point, no right upper quadrant tenderness, pain is not exacerbated with eating and she has been  compliant with her Xarelto, I have low suspicion for acute appendicitis, cholecystitis, or mesenteric ischemia.  Patient presentation seems consistent with gastroenteritis, will obtain labs including CBC, CMP, magnesium, give patient 1 L LR bolus.  Tylenol given for headache.  Patient has right ankle pain, no obvious erythema or deformity, x-ray ordered.  I personally reviewed and interpreted patient's labs which were significant for hypokalemia of 2.1, bicarb 20 which is improved from prior, no anion gap acidosis.  Patient does have a leukocytosis of 14.3, this appears chronic as she  had the same leukocytosis on 01/04/2022.  C. difficile test negative.  Patient's labs consistent with gastrointestinal losses.  Patient given potassium replacement 40 mEq.  I personally reviewed and interpreted patient's imaging which shows no acute fracture of her right ankle or foot.  I personally reviewed and interpreted patient's EKG which shows atrial fibrillation with PVCs, no acute myocardial infarction.  With patient's significant gastrointestinal losses and severe hypokalemia, I called and discussed this case with the hospitalist team who agreed to admit patient to their service.  Patient stable upon admission to hospital.  Problems Addressed: Diarrhea, unspecified type: acute illness or injury that poses a threat to life or bodily functions Hypokalemia due to excessive gastrointestinal loss of potassium: acute illness or injury that poses a threat to life or bodily functions  Amount and/or Complexity of Data Reviewed External Data Reviewed: labs and notes.    Details: Per chart review patient's baseline creatinine is approximately is 1.1-1.2.  Patient was discharged on 01/04/2022 after admission for complex headache secondary to CSF leak.  Labs: ordered. Decision-making details documented in ED Course. Radiology: ordered and independent interpretation performed. Decision-making details documented in ED  Course. ECG/medicine tests: ordered and independent interpretation performed. Decision-making details documented in ED Course.  Risk OTC drugs. Prescription drug management. Decision regarding hospitalization.           Final Clinical Impression(s) / ED Diagnoses Final diagnoses:  None    Rx / DC Orders ED Discharge Orders     None         Jimmie Molly, MD 06/16/22 2336    Tegeler, Gwenyth Allegra, MD 06/18/22 1537

## 2022-06-16 NOTE — ED Notes (Signed)
ED TO INPATIENT HANDOFF REPORT  ED Nurse Name and Phone #: Gennaro Africa [phone] Name/Age/Gender Jenny Kelly 77 y.o. female Room/Bed: 023C/023C  Code Status   Code Status: Prior  Home/SNF/Other Home Patient oriented to: self, place, time, and situation Is this baseline? Yes   Triage Complete: Triage complete  Chief Complaint Hypokalemia [E87.6]  Triage Note Pt to the ed from home via ems with a CC of diarrhea x 1.5 pt relays increased weakness and has been unable to walk x 2 days. Pt relays some nausea with out vomiting. Pt denies fever/chills. Pt relays slight abd pain     Allergies Allergies  Allergen Reactions   Ciprofloxacin Hives    Ask patient   Prochlorperazine Edisylate Other (See Comments)    Cant swallow Muscle cramping   Morphine Other (See Comments)    Confusion   Prednisone Other (See Comments)    unknown   Reduced Iso-Alpha Acids Complex Other (See Comments)    unknown   Statins Other (See Comments)    Muscle weakness    Sulfa Antibiotics Swelling   Azithromycin Rash   Latex Rash    Level of Care/Admitting Diagnosis ED Disposition     ED Disposition  Admit   Condition  --   Comment  Hospital Area: [facility] [[id number]]  Level of Care: Telemetry Medical [104]  May place patient in observation at [facility] or [place] if equivalent level of care is available:: Yes  Covid Evaluation: Confirmed COVID Negative  Diagnosis: Hypokalemia [id number]  Admitting Physician: Pindall, [place]  Attending Physician: Haywood Pao          B Medical/Surgery History Past Medical History:  Diagnosis Date   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   Allergy 06/09/2021   Anemia    Anxiety    Arthritis    Asthma    Atrial fibrillation (HCC)    Blood transfusion without  reported diagnosis    Cataract    CHF (congestive heart failure) (HCC)    Chronic kidney disease    Clotting disorder (HCC)    COPD (chronic obstructive pulmonary disease) (HCC)    Dementia (HCC)    Depression    Diabetes mellitus without complication (HCC)    Dyspnea    GERD (gastroesophageal reflux disease)    Great toe pain, left 11/01/2021   Headache    Heart murmur    History of CVA with residual deficit 11/01/2021   History of kidney stones    Hyperlipidemia    Hypothyroidism    Myocardial infarction [facility])    Neuromuscular disorder ([place])    tremors   Pneumonia due to COVID-19 virus    Postoperative wound infection 03/10/2014   Seizures ([place])    Sleep apnea    Stroke ([place])    Tension-type headache, not intractable 01/10/2016   Thyroid disease    Wide-complex tachycardia    Past Surgical History:  Procedure Laterality Date   ABDOMINAL HYSTERECTOMY     APPENDECTOMY     bladder tack     CHOLECYSTECTOMY     CORONARY ARTERY BYPASS GRAFT     CYSTOSCOPY W/ URETERAL STENT PLACEMENT Left 05/01/2021   Procedure: CYSTOSCOPY WITH RETROGRADE PYELOGRAM/URETERAL STENT PLACEMENT;  Surgeon: Ceasar Mons, MD;  Location: [facility];  Service: Urology;  Laterality:  Left;   CYSTOSCOPY/URETEROSCOPY/HOLMIUM LASER/STENT PLACEMENT Left 08/09/2021   Procedure: CYSTOSCOPY/URETEROSCOPY/HOLMIUM LASER/STENT EXCHANGE;  Surgeon: Ceasar Mons, MD;  Location: [facility];  Service: Urology;  Laterality: Left;   ESOPHAGOGASTRODUODENOSCOPY ENDOSCOPY     EYE SURGERY     FRACTURE SURGERY Right    3rd finger   IR FLUORO GUIDED NEEDLE PLC ASPIRATION/INJECTION LOC  12/27/2021   IR FLUORO GUIDED NEEDLE PLC ASPIRATION/INJECTION LOC  01/01/2022   JOINT REPLACEMENT Bilateral    knees   TONSILLECTOMY AND ADENOIDECTOMY     TUBAL LIGATION       A IV Location/Drains/Wounds Patient Lines/Drains/Airways Status     Active Line/Drains/Airways     Name Placement date Placement time Site Days    Peripheral IV 06/16/22 20 G Anterior;Proximal;Right Forearm 06/16/22  1459  Forearm  less than 1   Ureteral Drain/Stent Left ureter 6 Fr. 08/09/21  1034  Left ureter  311            Intake/Output Last 24 hours No intake or output data in the 24 hours ending 06/16/22 1943  Labs/Imaging Results for orders placed or performed during the hospital encounter of 06/16/22 (from the past 48 hour(s))  CBC with Differential     Status: Abnormal   Collection Time: 06/16/22  3:06 PM  Result Value Ref Range   WBC 14.3 (H) 4.0 - 10.5 K/uL   RBC 4.92 3.87 - 5.11 MIL/uL   Hemoglobin 12.6 12.0 - 15.0 g/dL   HCT 41.4 36.0 - 46.0 %   MCV 84.1 80.0 - 100.0 fL   MCH 25.6 (L) 26.0 - 34.0 pg   MCHC 30.4 30.0 - 36.0 g/dL   RDW 18.2 (H) 11.5 - 15.5 %   Platelets 324 150 - 400 K/uL   nRBC 0.0 0.0 - 0.2 %   Neutrophils Relative % 71 %   Neutro Abs 10.1 (H) 1.7 - 7.7 K/uL   Lymphocytes Relative 18 %   Lymphs Abs 2.6 0.7 - 4.0 K/uL   Monocytes Relative 8 %   Monocytes Absolute 1.2 (H) 0.1 - 1.0 K/uL   Eosinophils Relative 1 %   Eosinophils Absolute 0.2 0.0 - 0.5 K/uL   Basophils Relative 1 %   Basophils Absolute 0.1 0.0 - 0.1 K/uL   Immature Granulocytes 1 %   Abs Immature Granulocytes 0.09 (H) 0.00 - 0.07 K/uL    Comment: Performed at [facility], [address] [place], [place] [address]  Comprehensive metabolic panel     Status: Abnormal   Collection Time: 06/16/22  3:06 PM  Result Value Ref Range   Sodium 140 135 - 145 mmol/L   Potassium 2.1 (LL) 3.5 - 5.1 mmol/L    Comment: CRITICAL RESULT CALLED TO, READ BACK BY AND VERIFIED WITH J. KANTLEHENER RN 06/16/22 '@1827'$  BY J. WHITE   Chloride 108 98 - 111 mmol/L   CO2 20 (L) 22 - 32 mmol/L   Glucose, Bld 135 (H) 70 - 99 mg/dL    Comment: Glucose reference range applies only to samples taken after fasting for at least 8 hours.   BUN 18 8 - 23 mg/dL   Creatinine, Ser 1.05 (H) 0.44 - 1.00 mg/dL   Calcium 8.7 (L) 8.9 - 10.3 mg/dL   Total  Protein 7.1 6.5 - 8.1 g/dL   Albumin 2.6 (L) 3.5 - 5.0 g/dL   AST 17 15 - 41 U/L   ALT 8 0 - 44 U/L   Alkaline Phosphatase 89 38 - 126  U/L   Total Bilirubin 0.6 0.3 - 1.2 mg/dL   GFR, Estimated 55 (L) >60 mL/min    Comment: (NOTE) Calculated using the CKD-EPI Creatinine Equation (2021)    Anion gap 12 5 - 15    Comment: Performed at [facility] [address] [place], [place] [address]  Magnesium     Status: None   Collection Time: 06/16/22  3:06 PM  Result Value Ref Range   Magnesium 1.7 1.7 - 2.4 mg/dL    Comment: Performed at [facility] [address] [place],  [address]   DG Foot 2 Views Right  Result Date: 06/16/2022 CLINICAL DATA:  5th metatarsal pain EXAM: RIGHT FOOT - 2 VIEW COMPARISON:  None Available. FINDINGS: No acute fracture or dislocation. Severe osteoarthritis of the first MTP joint. Mild-to-moderate degenerative changes within the midfoot. Plantar calcaneal spur. There is mild soft tissue prominence lateral to the fifth MTP joint, nonspecific. IMPRESSION: 1. No acute osseous abnormality. 2. Severe osteoarthritis of the first MTP joint. 3. Mild soft tissue prominence lateral to the fifth MTP joint, nonspecific. Electronically Signed   By: Davina Poke D.O.   On: 06/16/2022 16:42   DG Ankle Complete Right  Result Date: 06/16/2022 CLINICAL DATA:  Pain EXAM: RIGHT ANKLE - COMPLETE 3 VIEW COMPARISON:  None Available. FINDINGS: No acute fracture, dislocation or subluxation. Soft tissues are unremarkable. There is a plantar calcaneal spur. Ankle mortise Joint space narrowing. IMPRESSION: Osteoarthritis of the ankle mortise. Plantar calcaneal spur. No acute osseous abnormalities. Electronically Signed   By: Sammie Bench M.D.   On: 06/16/2022 16:15    Pending Labs Unresulted Labs (From admission, onward)     Start     Ordered   06/16/22 1508  C Difficile Quick Screen w PCR reflex  (C Difficile quick screen w PCR reflex panel )  Once, for 24  hours,   URGENT       References:    CDiff Information Tool   06/16/22 1507            Vitals/Pain Today's Vitals   06/16/22 1424 06/16/22 1500 06/16/22 1730 06/16/22 1900  BP: 137/69 (!) 147/93 (!) 131/100 137/87  Pulse: 80 77 70 86  Resp: '16 19 18 18  '$ Temp: 98.6 F (37 C)  98.5 F (36.9 C)   TempSrc: Oral  Oral   SpO2: 99% 100% 98% 95%  Weight: 53.5 kg  53 kg   Height: '5\' 2"'$  (1.575 m)  '5\' 2"'$  (1.575 m)   PainSc:   3      Isolation Precautions Enteric precautions (UV disinfection)  Medications Medications  magnesium sulfate IVPB 1 g 100 mL (has no administration in time range)  potassium chloride SA (KLOR-CON M) CR tablet 40 mEq (has no administration in time range)  lactated ringers 1,000 mL with potassium chloride 20 mEq infusion (has no administration in time range)  lactated ringers bolus 1,000 mL (0 mLs Intravenous Stopped 06/16/22 1908)  acetaminophen (TYLENOL) tablet 1,000 mg (1,000 mg Oral Given 06/16/22 1516)  potassium chloride SA (KLOR-CON M) CR tablet 40 mEq (40 mEq Oral Given 06/16/22 1840)    Mobility non-ambulatory     Focused Assessments Cardiac Assessment Handoff:    No results found for: "CKTOTAL", "CKMB", "CKMBINDEX", "TROPONINI" No results found for: "DDIMER" Does the Patient currently have chest pain? No   , Neuro Assessment Handoff:  Swallow screen pass? Yes          Neuro Assessment:  Within Defined Limits Neuro Checks:      Has TPA been given? No If patient is a Neuro Trauma and patient is going to OR before floor call report to [facility] nurse: [phone] or [phone]   R Recommendations: See Admitting Provider Note  Report given to:   Additional Notes:

## 2022-06-17 DIAGNOSIS — X58XXXA Exposure to other specified factors, initial encounter: Secondary | ICD-10-CM | POA: Diagnosis present

## 2022-06-17 DIAGNOSIS — I2510 Atherosclerotic heart disease of native coronary artery without angina pectoris: Secondary | ICD-10-CM | POA: Diagnosis present

## 2022-06-17 DIAGNOSIS — E860 Dehydration: Secondary | ICD-10-CM | POA: Diagnosis present

## 2022-06-17 DIAGNOSIS — Z87891 Personal history of nicotine dependence: Secondary | ICD-10-CM | POA: Diagnosis not present

## 2022-06-17 DIAGNOSIS — I480 Paroxysmal atrial fibrillation: Secondary | ICD-10-CM | POA: Diagnosis present

## 2022-06-17 DIAGNOSIS — E039 Hypothyroidism, unspecified: Secondary | ICD-10-CM | POA: Diagnosis present

## 2022-06-17 DIAGNOSIS — I252 Old myocardial infarction: Secondary | ICD-10-CM | POA: Diagnosis not present

## 2022-06-17 DIAGNOSIS — Z8701 Personal history of pneumonia (recurrent): Secondary | ICD-10-CM | POA: Diagnosis not present

## 2022-06-17 DIAGNOSIS — J4489 Other specified chronic obstructive pulmonary disease: Secondary | ICD-10-CM | POA: Diagnosis present

## 2022-06-17 DIAGNOSIS — E785 Hyperlipidemia, unspecified: Secondary | ICD-10-CM | POA: Diagnosis present

## 2022-06-17 DIAGNOSIS — J8410 Pulmonary fibrosis, unspecified: Secondary | ICD-10-CM | POA: Diagnosis present

## 2022-06-17 DIAGNOSIS — G40909 Epilepsy, unspecified, not intractable, without status epilepticus: Secondary | ICD-10-CM | POA: Diagnosis present

## 2022-06-17 DIAGNOSIS — I69351 Hemiplegia and hemiparesis following cerebral infarction affecting right dominant side: Secondary | ICD-10-CM | POA: Diagnosis not present

## 2022-06-17 DIAGNOSIS — E1122 Type 2 diabetes mellitus with diabetic chronic kidney disease: Secondary | ICD-10-CM | POA: Diagnosis present

## 2022-06-17 DIAGNOSIS — Z8616 Personal history of COVID-19: Secondary | ICD-10-CM | POA: Diagnosis not present

## 2022-06-17 DIAGNOSIS — Z66 Do not resuscitate: Secondary | ICD-10-CM | POA: Diagnosis present

## 2022-06-17 DIAGNOSIS — F0390 Unspecified dementia without behavioral disturbance: Secondary | ICD-10-CM | POA: Diagnosis present

## 2022-06-17 DIAGNOSIS — E876 Hypokalemia: Secondary | ICD-10-CM | POA: Diagnosis present

## 2022-06-17 DIAGNOSIS — S30810A Abrasion of lower back and pelvis, initial encounter: Secondary | ICD-10-CM | POA: Diagnosis present

## 2022-06-17 DIAGNOSIS — N1831 Chronic kidney disease, stage 3a: Secondary | ICD-10-CM | POA: Diagnosis present

## 2022-06-17 DIAGNOSIS — J9610 Chronic respiratory failure, unspecified whether with hypoxia or hypercapnia: Secondary | ICD-10-CM | POA: Diagnosis present

## 2022-06-17 DIAGNOSIS — Z951 Presence of aortocoronary bypass graft: Secondary | ICD-10-CM | POA: Diagnosis not present

## 2022-06-17 DIAGNOSIS — A084 Viral intestinal infection, unspecified: Secondary | ICD-10-CM | POA: Diagnosis present

## 2022-06-17 DIAGNOSIS — I509 Heart failure, unspecified: Secondary | ICD-10-CM | POA: Diagnosis present

## 2022-06-17 LAB — CBC WITH DIFFERENTIAL/PLATELET
Abs Immature Granulocytes: 0.04 10*3/uL (ref 0.00–0.07)
Basophils Absolute: 0.1 10*3/uL (ref 0.0–0.1)
Basophils Relative: 1 %
Eosinophils Absolute: 0.3 10*3/uL (ref 0.0–0.5)
Eosinophils Relative: 3 %
HCT: 42.1 % (ref 36.0–46.0)
Hemoglobin: 13.3 g/dL (ref 12.0–15.0)
Immature Granulocytes: 0 %
Lymphocytes Relative: 37 %
Lymphs Abs: 3.7 10*3/uL (ref 0.7–4.0)
MCH: 25.9 pg — ABNORMAL LOW (ref 26.0–34.0)
MCHC: 31.6 g/dL (ref 30.0–36.0)
MCV: 81.9 fL (ref 80.0–100.0)
Monocytes Absolute: 0.8 10*3/uL (ref 0.1–1.0)
Monocytes Relative: 8 %
Neutro Abs: 4.9 10*3/uL (ref 1.7–7.7)
Neutrophils Relative %: 51 %
Platelets: 278 10*3/uL (ref 150–400)
RBC: 5.14 MIL/uL — ABNORMAL HIGH (ref 3.87–5.11)
RDW: 18.1 % — ABNORMAL HIGH (ref 11.5–15.5)
WBC: 9.8 10*3/uL (ref 4.0–10.5)
nRBC: 0 % (ref 0.0–0.2)

## 2022-06-17 LAB — BASIC METABOLIC PANEL
Anion gap: 11 (ref 5–15)
Anion gap: 12 (ref 5–15)
BUN: 14 mg/dL (ref 8–23)
BUN: 12 mg/dL (ref 8–23)
CO2: 23 mmol/L (ref 22–32)
CO2: 21 mmol/L — ABNORMAL LOW (ref 22–32)
Calcium: 8.6 mg/dL — ABNORMAL LOW (ref 8.9–10.3)
Calcium: 8.2 mg/dL — ABNORMAL LOW (ref 8.9–10.3)
Chloride: 105 mmol/L (ref 98–111)
Chloride: 104 mmol/L (ref 98–111)
Creatinine, Ser: 0.96 mg/dL (ref 0.44–1.00)
Creatinine, Ser: 0.94 mg/dL (ref 0.44–1.00)
GFR, Estimated: >60 mL/min (ref >60)
GFR, Estimated: >60 mL/min (ref >60)
Glucose, Bld: 100 mg/dL — ABNORMAL HIGH (ref 70–99)
Glucose, Bld: 123 mg/dL — ABNORMAL HIGH (ref 70–99)
Potassium: 2 mmol/L — CL (ref 3.5–5.1)
Potassium: 2.7 mmol/L — CL (ref 3.5–5.1)
Sodium: 139 mmol/L (ref 135–145)
Sodium: 137 mmol/L (ref 135–145)

## 2022-06-17 LAB — SEDIMENTATION RATE: Sed Rate: 57 mm/hr — ABNORMAL HIGH (ref 0–22)

## 2022-06-17 LAB — GLUCOSE, CAPILLARY
Glucose-Capillary: 81 mg/dL (ref 70–99)
Glucose-Capillary: 93 mg/dL (ref 70–99)
Glucose-Capillary: 119 mg/dL — ABNORMAL HIGH (ref 70–99)

## 2022-06-17 LAB — TSH: TSH: 0.383 u[IU]/mL (ref 0.350–4.500)

## 2022-06-17 LAB — MAGNESIUM: Magnesium: 2 mg/dL (ref 1.7–2.4)

## 2022-06-17 LAB — C-REACTIVE PROTEIN: CRP: 19.3 mg/dL — ABNORMAL HIGH (ref <1.0)

## 2022-06-17 MED ORDER — DIPHENOXYLATE-ATROPINE 2.5-0.025 MG PO TABS: 1.0000 | ORAL_TABLET | Freq: Four times a day (QID) | ORAL | Status: DC | PRN

## 2022-06-17 MED ORDER — POTASSIUM CHLORIDE 10 MEQ/100ML IV SOLN
10.0000 meq | INTRAVENOUS | Status: DC
  Administered 2022-06-17: 10 meq via INTRAVENOUS
  Filled 2022-06-17: qty 100

## 2022-06-17 MED ORDER — MAGNESIUM SULFATE 2 GM/50ML IV SOLN: 2.0000 g | Freq: Once | INTRAVENOUS | Status: DC

## 2022-06-17 MED ORDER — DIPHENHYDRAMINE HCL 50 MG/ML IJ SOLN
12.5000 mg | Freq: Once | INTRAMUSCULAR | Status: AC
  Administered 2022-06-17: 12.5 mg via INTRAVENOUS
  Filled 2022-06-17: qty 1

## 2022-06-17 MED ORDER — ENSURE ENLIVE PO LIQD
237.0000 mL | Freq: Three times a day (TID) | ORAL | Status: DC
  Administered 2022-06-17 – 2022-06-18 (×2): 237 mL via ORAL

## 2022-06-17 MED ORDER — CHOLESTYRAMINE LIGHT 4 G PO PACK
4.0000 g | PACK | Freq: Once | ORAL | Status: AC
  Administered 2022-06-17: 4 g via ORAL
  Filled 2022-06-17: qty 1

## 2022-06-17 MED ORDER — METOCLOPRAMIDE HCL 5 MG/ML IJ SOLN
5.0000 mg | Freq: Once | INTRAMUSCULAR | Status: AC
  Administered 2022-06-17: 5 mg via INTRAVENOUS
  Filled 2022-06-17: qty 2

## 2022-06-17 MED ORDER — LOPERAMIDE HCL 2 MG PO CAPS
2.0000 mg | ORAL_CAPSULE | ORAL | Status: DC | PRN
  Administered 2022-06-17 (×2): 2 mg via ORAL
  Filled 2022-06-17 (×2): qty 1

## 2022-06-17 MED ORDER — POTASSIUM CHLORIDE 20 MEQ PO PACK
40.0000 meq | PACK | ORAL | Status: DC
  Administered 2022-06-17: 40 meq via ORAL
  Filled 2022-06-17: qty 2

## 2022-06-17 MED ORDER — MAGNESIUM SULFATE IN D5W 1-5 GM/100ML-% IV SOLN
1.0000 g | Freq: Once | INTRAVENOUS | Status: AC
  Administered 2022-06-17: 1 g via INTRAVENOUS
  Filled 2022-06-17: qty 100

## 2022-06-17 MED ORDER — GERHARDT'S BUTT CREAM
TOPICAL_CREAM | Freq: Two times a day (BID) | CUTANEOUS | Status: DC
  Filled 2022-06-17: qty 1

## 2022-06-17 MED ORDER — POTASSIUM CHLORIDE 10 MEQ/100ML IV SOLN
INTRAVENOUS | Status: AC
  Administered 2022-06-17: 10 meq
  Filled 2022-06-17: qty 100

## 2022-06-17 MED ORDER — GUAIFENESIN-DM 100-10 MG/5ML PO SYRP: 5.0000 mL | ORAL_SOLUTION | ORAL | Status: DC | PRN

## 2022-06-17 MED ORDER — GERHARDT'S BUTT CREAM
1.0000 | TOPICAL_CREAM | Freq: Three times a day (TID) | CUTANEOUS | Status: DC
  Administered 2022-06-17 – 2022-06-18 (×5): 1 via TOPICAL
  Filled 2022-06-17: qty 1

## 2022-06-17 MED ORDER — POTASSIUM CHLORIDE 20 MEQ PO PACK
60.0000 meq | PACK | ORAL | Status: AC
  Administered 2022-06-17 (×2): 60 meq via ORAL
  Filled 2022-06-17 (×2): qty 3

## 2022-06-17 NOTE — Evaluation (Signed)
Physical Therapy Evaluation Patient Details Name: Jenny Kelly MRN: [id number] DOB: [birth year] [name]: 06/17/2022  History of Present Illness  This is a 77 year old female  Patient presents due to diarrhea and weakness; with PMHx of A. fib (on xarelto), CKD stage 3a, COPD, pulmonary fibrosis, seizure disorder, h/o CVA w/ residual right sided weakness, hx of cluster HA and migraines.  Clinical Impression   Pt admitted with above diagnosis. Lives at home with husband, in a single-level home with no steps to enter; Prior to admission, pt was able to walk household distances using a rollator, husband assists with showering; Presents to PT with generalized weakness, decr activity tolerance;  Needs mod assist to get up to EOB, stand at EOB, and take a few steps with RW today; Anticipate good progress as dehydration clears; Pt currently with functional limitations due to the deficits listed below (see PT Problem List). Pt will benefit from skilled PT to increase their independence and safety with mobility to allow discharge to the venue listed below.          Recommendations for follow up therapy are one component of a multi-disciplinary discharge planning process, led by the attending physician.  Recommendations may be updated based on patient status, additional functional criteria and insurance authorization.  Follow Up Recommendations Home health PT (Have used [facility] in teh past)      Assistance Recommended at Discharge Frequent or constant Supervision/Assistance  Patient can return home with the following  A lot of help with walking and/or transfers;Assistance with cooking/housework;A little help with bathing/dressing/bathroom;Help with stairs or ramp for entrance    Equipment Recommendations None recommended by PT (pretty well-equipped)  Recommendations for Other Services       Functional Status Assessment Patient has had a recent decline in their functional status and demonstrates  the ability to make significant improvements in function in a reasonable and predictable amount of time.     Precautions / Restrictions Precautions Precautions: Fall Precaution Comments: Photosensitivity Restrictions Weight Bearing Restrictions: No      Mobility  Bed Mobility Overal bed mobility: Needs Assistance Bed Mobility: Supine to Sit, Sit to Supine     Supine to sit: Mod assist Sit to supine: Mod assist   General bed mobility comments: Step-by-step cues for scooting to EOB and using rail to push up; Heavy mod assist to wlwvate trunk to sit; used bed pad to square off hips at EOB; Ligth mod assist to help LEs back into bed    Transfers Overall transfer level: Needs assistance Equipment used: Rolling walker (2 wheels) Transfers: Sit to/from Stand, Bed to chair/wheelchair/BSC Sit to Stand: Mod assist   Step pivot transfers: Mod assist       General transfer comment: Mod assist to power up to stand adn assist L hand onto RW handle; simulated step pivot transfer by taking sidesteps towards [facility]; pt pleasantly surprised at abiltiy to step    Ambulation/Gait                  Stairs            Wheelchair Mobility    Modified Rankin (Stroke Patients Only)       Balance                                             Pertinent Vitals/Pain Pain Assessment Pain Assessment: No/denies  pain    Home Living Family/patient expects to be discharged to:: Private residence Living Arrangements: Spouse/significant other Available Help at Discharge: Family;Available 24 hours/day Type of Home: House Home Access: Level entry       Home Layout: One level Home Equipment: Rolling Walker (2 wheels);BSC/3in1;Shower seat;Grab bars - tub/shower;Hand held shower head;Wheelchair Probation officer (4 wheels)      Prior Function Prior Level of Function : Needs assist             Mobility Comments: Uses rollator in the home ADLs Comments: spouse  assist pt PRN, pt states she cant wipe buttocks well; spouse assists with getting in/out of shower,     Hand Dominance   Dominant Hand: Right    Extremity/Trunk Assessment   Upper Extremity Assessment Upper Extremity Assessment: RUE deficits/detail;LUE deficits/detail RUE Deficits / Details: Grossly WFL LUE Deficits / Details: Grossly decr AROM and strength; Limited shoulder and elbow movement post CVA    Lower Extremity Assessment Lower Extremity Assessment: Generalized weakness;RLE deficits/detail;LLE deficits/detail RLE Deficits / Details: Pt and husband report recent R ankle soreness effecting ability to walk; No overt difficulty of RLE with standing and stepping at EOB today LLE Deficits / Details: Generally weak from CVA, not formally MMT; able to accept weight, and se LLE to push up to stand       Communication   Communication: HOH  Cognition Arousal/Alertness: Awake/alert Behavior During Therapy: WFL for tasks assessed/performed Overall Cognitive Status: History of cognitive impairments - at baseline                                          General Comments General comments (skin integrity, edema, etc.): Husband present and helpful    Exercises     Assessment/Plan    PT Assessment Patient needs continued PT services  PT Problem List Decreased strength;Decreased range of motion;Decreased activity tolerance;Decreased balance;Decreased mobility;Decreased coordination;Decreased cognition;Decreased knowledge of use of DME;Decreased safety awareness;Decreased knowledge of precautions       PT Treatment Interventions DME instruction;Gait training;Functional mobility training;Therapeutic activities;Therapeutic exercise;Balance training;Cognitive remediation;Patient/family education;Neuromuscular re-education    PT Goals (Current goals can be found in the Care Plan section)  Acute Rehab PT Goals Patient Stated Goal: Hopes to get home soon PT Goal  Formulation: With patient Time For Goal Achievement: 07/01/22 Potential to Achieve Goals: Good    Frequency Min 3X/week     Co-evaluation               AM-PAC PT "6 Clicks" Mobility  Outcome Measure Help needed turning from your back to your side while in a flat bed without using bedrails?: A Little Help needed moving from lying on your back to sitting on the side of a flat bed without using bedrails?: A Lot Help needed moving to and from a bed to a chair (including a wheelchair)?: A Lot Help needed standing up from a chair using your arms (e.g., wheelchair or bedside chair)?: A Lot Help needed to walk in hospital room?: A Lot Help needed climbing 3-5 steps with a railing? : Total 6 Click Score: 12    End of Session Equipment Utilized During Treatment: Gait belt Activity Tolerance: Patient tolerated treatment well Patient left: in bed;with call bell/phone within reach;with bed alarm set;with family/visitor present (bed in semi-chair position) Nurse Communication: Mobility status PT Visit Diagnosis: Unsteadiness on feet (R26.81);Other abnormalities of gait and  mobility (R26.89);Muscle weakness (generalized) (M62.81)    Time: [id number] PT Time Calculation (min) (ACUTE ONLY): 33 min   Charges:   PT Evaluation $PT Eval Moderate Complexity: 1 Mod PT Treatments $Therapeutic Activity: 8-22 mins        Roney Marion, PT  Acute Rehabilitation Services Office [phone]   Colletta [place] 06/17/2022, 4:49 PM

## 2022-06-17 NOTE — Hospital Course (Signed)
77-year-old female with PMHx of A. fib (on xarelto), CKD stage 3a, COPD, pulmonary fibrosis, seizure disorder, h/o CVA w/ residual right sided weakness, hx of cluster HA and migraines.  Patient presents due to diarrhea and weakness.   C. difficile is negative.

## 2022-06-17 NOTE — Evaluation (Signed)
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   Allergy 06/09/2021   Anemia    Anxiety    Arthritis    Asthma    Atrial fibrillation (HCC)    Blood transfusion without reported diagnosis    Cataract    CHF (congestive heart failure) (HCC)    Chronic kidney disease    Clotting disorder (HCC)    COPD (chronic obstructive pulmonary disease) (HCC)    Dementia (HCC)    Depression    Diabetes mellitus without complication (HCC)    Dyspnea    GERD (gastroesophageal reflux disease)    Great toe pain, left 11/01/2021   Headache    Heart murmur    History of CVA with residual deficit 11/01/2021   History of kidney stones    Hyperlipidemia    Hypothyroidism    Myocardial infarction ([place])    Neuromuscular disorder ([place])    tremors   Pneumonia due to COVID-19 virus    Postoperative wound infection 03/10/2014   Seizures ([place])    Sleep apnea    Stroke (HCC)    Tension-type headache, not intractable 01/10/2016   Thyroid disease    Wide-complex tachycardia    Past Surgical History:  Past Surgical History:  Procedure Laterality Date   ABDOMINAL HYSTERECTOMY     APPENDECTOMY     bladder tack     CHOLECYSTECTOMY     CORONARY ARTERY BYPASS GRAFT     CYSTOSCOPY W/ URETERAL STENT PLACEMENT Left 05/01/2021   Procedure: CYSTOSCOPY WITH RETROGRADE PYELOGRAM/URETERAL STENT PLACEMENT;  Surgeon: Ceasar Mons, MD;  Location: [facility];  Service: Urology;  Laterality: Left;   CYSTOSCOPY/URETEROSCOPY/HOLMIUM LASER/STENT PLACEMENT  Left 08/09/2021   Procedure: CYSTOSCOPY/URETEROSCOPY/HOLMIUM LASER/STENT EXCHANGE;  Surgeon: Ceasar Mons, MD;  Location: [facility];  Service: Urology;  Laterality: Left;   ESOPHAGOGASTRODUODENOSCOPY ENDOSCOPY     EYE SURGERY     FRACTURE SURGERY Right    3rd finger   IR FLUORO GUIDED NEEDLE PLC ASPIRATION/INJECTION LOC  12/27/2021   IR FLUORO GUIDED NEEDLE PLC ASPIRATION/INJECTION LOC  01/01/2022   JOINT REPLACEMENT Bilateral    knees   TONSILLECTOMY AND ADENOIDECTOMY     TUBAL LIGATION     HPI:  Patient is a 77 y.o. female with PMH: COPD, pulmonary fibrosis, seizure disorder, h/o CVA with residual right sided weakness/paresis, h/o cluster HA and migraines, dementia, a-fib, CKD stage 3a. She presented to the hospital on 06/16/22 with diarrhea and weakness, unable to keep food down but keeping fluids down and taking medications. In ER she reported some abdominal discomfort. Husband noticed some increased coughing after drinking and/or eating and diet changed to Dys 2 solids, SLP evaluation of swallow ordered.    Assessment / Plan / Recommendation  Clinical Impression  Patient presents with clinical s/s of dysphagia as per this bedside swallow evaluation but suspect she is close to baseline dysphagia related to limited amount of dentition and residual left sided weakness from prior CVA. SLP assessed patient's toleration with thin liquids (water, juice) and mechanical soft solids (  graham cracker, saltine cracker). She did not exhibit any immediate coughing or throat clearing but did exhibit delayed, dry sounding cough after eating crackers. Prolonged mastication observed with both graham and saltine crackers. Per patient she can eat bacon if it is crispy and breaks off but not if its chewy. Spouse reported that she can eat some crackers, green beans and foods that are soft. She tried top dentures in the past but they were loose fitting, she wasnt candidate for dental implants. SLP recommending  trial of Dys 3 (mechanical soft) solids and continue with thin liquids but will plan to downgrade back to Dys 2 solids if she isnt' tolerating. SLP will briefly follow. SLP Visit Diagnosis: Dysphagia, unspecified (R13.10)    Aspiration Risk  Mild aspiration risk    Diet Recommendation Dysphagia 3 (Mech soft);Thin liquid   Liquid Administration via: Cup;Straw Medication Administration: Whole meds with puree Supervision: Full supervision/cueing for compensatory strategies;Staff to assist with self feeding;Patient able to self feed Compensations: Slow rate;Small sips/bites Postural Changes: Seated upright at 90 degrees    Other  Recommendations Oral Care Recommendations: Oral care BID    Recommendations for follow up therapy are one component of a multi-disciplinary discharge planning process, led by the attending physician.  Recommendations may be updated based on patient status, additional functional criteria and insurance authorization.  Follow up Recommendations Other (comment) (SLP services recommended at next venue of care)      Assistance Recommended at Discharge    Functional Status Assessment Patient has had a recent decline in their functional status and demonstrates the ability to make significant improvements in function in a reasonable and predictable amount of time.  Frequency and Duration min 2x/week  1 week       Prognosis Prognosis for improved oropharyngeal function: Fair Barriers to Reach Goals: Time post onset      Swallow Study   General Date of Onset: 06/16/22 HPI: Patient is a 77 y.o. female with PMH: COPD, pulmonary fibrosis, seizure disorder, h/o CVA with residual right sided weakness/paresis, h/o cluster HA and migraines, dementia, a-fib, CKD stage 3a. She presented to the hospital on 06/16/22 with diarrhea and weakness, unable to keep food down but keeping fluids down and taking medications. In ER she reported some abdominal discomfort. Husband noticed some  increased coughing after drinking and/or eating and diet changed to Dys 2 solids, SLP evaluation of swallow ordered. Type of Study: Bedside Swallow Evaluation Previous Swallow Assessment: during previous hospitalization including AIR, January of 2023 Diet Prior to this Study: Dysphagia 2 (finely chopped);Thin liquids (Level 0) Temperature Spikes Noted: No Respiratory Status: Room air History of Recent Intubation: No Behavior/Cognition: Alert;Cooperative;Pleasant mood Oral Cavity Assessment: Within Functional Limits Oral Care Completed by SLP: No Oral Cavity - Dentition: Poor condition;Missing dentition Vision: Functional for self-feeding Self-Feeding Abilities: Needs set up;Needs assist Patient Positioning: Upright in bed Baseline Vocal Quality: Normal Volitional Cough: Strong    Oral/Motor/Sensory Function Overall Oral Motor/Sensory Function: Mild impairment Facial ROM: Within Functional Limits Facial Symmetry: Abnormal symmetry left Facial Strength: Reduced left Facial Sensation: Within Functional Limits Lingual ROM: Within Functional Limits Lingual Symmetry: Within Functional Limits Lingual Strength: Within Functional Limits Lingual Sensation: Within Functional Limits Velum: Within Functional Limits Mandible: Within Functional Limits   Ice Chips     Thin Liquid Thin Liquid: Within functional limits Presentation: Straw;Self Fed    Nectar Thick     Honey Thick     Puree     Solid     Solid:  Impaired Oral Phase Impairments: Impaired mastication Pharyngeal Phase Impairments: Cough - Delayed     Sonia Baller, MA, CCC-SLP Speech Therapy

## 2022-06-17 NOTE — Progress Notes (Signed)
New Admission Note:   Arrival Method: Via stretcher from ED Mental Orientation:  A & O X3 - 4 Telemetry: Box 5M19 Assessment: Completed Skin:  MASD to sacrum and vagina IV:  RT AC Pain: C/O HA 10/10 Tubes:  Placed on [place] Safety Measures: Placed on Bed Alarm Admission: Completed 5 MW Orientation: Patient has been orientated to the room, unit and staff.  Family:  Husband at bedside  No personal belongings except clothes, necklace, and wedding ring.  Values policy explained to patient and her husband.    Orders have been reviewed and implemented. Will continue to monitor the patient. Call light has been placed within reach and bed alarm has been activated.   Earleen Reaper RN

## 2022-06-17 NOTE — Progress Notes (Signed)
Upon arrival to the floor, the patient is c/o headache 10/10.  Per husband, patient does have migraines.  Patient's eyes are sensitive to light but no nausea.  She usually takes her topamax and lyrica.  This was given with minimal relief.  Dr. Nevada Crane and Dr. Claria Dice made aware.  Order received for IV Benadryl and IV Reglan.  This was given at 0113 with complete relief of headache.  Also, patient usually takes her medications whole with applesauce.  Husband has noticed increased coughing after drinking and/or eating.  MD made aware.  Diet changed to Dys 2 Thin Liquid and ST Evaluation ordered.  Her CDIFF was negative.  Husband asked for Imodium and MD made aware and ordered received if needed PRN.  Patient is on Telemetry with frequent Bigeminy and Trigeminy.  MD made aware.  EKG ordered for in the AM.  Earleen Reaper RN

## 2022-06-17 NOTE — Progress Notes (Signed)
[facility] Progress Note Patient: SREEJA BRANDELL [id number] DOB: Sep 29, 1945 DOA: 06/16/2022  DOS: the patient was seen and examined on 06/17/2022  Brief hospital course: 77-year-old female with PMHx of A. fib (on xarelto), CKD stage 3a, COPD, pulmonary fibrosis, seizure disorder, h/o CVA w/ residual right sided weakness, hx of cluster HA and migraines.  Patient presents due to diarrhea and weakness.   C. difficile is negative.  Colitis to have diarrhea despite Imodium.  Potassium level still low. Assessment and Plan: Severe hypokalemia. Hypomagnesemia. Prolonged QTc. Acute evaluation secondary to potassium. Electrolyte imbalance secondary to diarrhea. Currently being treated with LR with potassium as well as supplement. Level still remains low therefore we will replace. Monitor.  Diarrhea. C. difficile is negative. Most likely viral gastroenteritis. Continue Imodium.  Add 1 dose of Questran. No abdominal pain.  History of CVA with residual weakness. On dysphagia diet currently. Patient does not use any dysphagia diet at home. Will advance to dysphagia 3 diet.  CAD. PAF. On aspirin and Xarelto. Will continue.  HLD. Unable to tolerate statin. On Zetia.  Dementia, no behavioral issues. On Aricept we will continue. Mentation actually is much clear, patient is able to provide adequate history.  COPD. Chronic pulmonary fibrosis. Chronic respiratory failure. Not on any oxygen at home. CRP chronically elevated. Will monitor.  Sacral excoriation. Present on admission. Secondary to diarrhea. Rectal pouch.  Hypothyroidism. Continue Synthroid. TSH not significantly suppressed to suspect hyperthyroidism causing diarrhea.  Subjective: Continues to have diarrhea.  No abdominal pain.  No nausea no vomiting.  Physical Exam: General: in Mild distress, No Rash Cardiovascular: S1 and S2 Present, No Murmur Respiratory: Good respiratory effort, Bilateral Air entry  present. No Crackles, No wheezes Abdomen: Bowel Sound present, No tenderness Extremities: No edema Neuro: Alert and oriented x3, no new focal deficit  Data Reviewed: I have Reviewed nursing notes, Vitals, and Lab results. Since last encounter, pertinent lab results CBC and BMP   . I have ordered test including CBC BMP and magnesium  .   Disposition: Status is: Inpatient Remains inpatient appropriate because: Potassium level severely low.  Continues to replace and require follow-up. Rivaroxaban (XARELTO) tablet 15 mg   Family Communication: Family at bedside Level of care: Telemetry Medical Due to hypokalemia Vitals:   06/17/22 0019 06/17/22 0456 06/17/22 0937 06/17/22 1704  BP: 108/60 127/72 132/69 (!) 115/96  Pulse: 62 (!) 56 74 91  Resp: '15 15 16 18  '$ Temp: 98.3 F (36.8 C) 98 F (36.7 C) 97.8 F (36.6 C) 98.9 F (37.2 C)  TempSrc:  Oral Oral Oral  SpO2: 99%  99% 98%  Weight:      Height:         Author: Berle Mull, MD 06/17/2022 5:34 PM  Please look on [email or web address] to find out who is on call.

## 2022-06-17 NOTE — Care Management (Signed)
Attempted MOON , patient is not currently avaialble

## 2022-06-18 DIAGNOSIS — E876 Hypokalemia: Secondary | ICD-10-CM | POA: Diagnosis not present

## 2022-06-18 LAB — CBC
HCT: 38.3 % (ref 36.0–46.0)
Hemoglobin: 11.4 g/dL — ABNORMAL LOW (ref 12.0–15.0)
MCH: 25.3 pg — ABNORMAL LOW (ref 26.0–34.0)
MCHC: 29.8 g/dL — ABNORMAL LOW (ref 30.0–36.0)
MCV: 85.1 fL (ref 80.0–100.0)
Platelets: 270 10*3/uL (ref 150–400)
RBC: 4.5 MIL/uL (ref 3.87–5.11)
RDW: 18.3 % — ABNORMAL HIGH (ref 11.5–15.5)
WBC: 8.7 10*3/uL (ref 4.0–10.5)
nRBC: 0 % (ref 0.0–0.2)

## 2022-06-18 LAB — BASIC METABOLIC PANEL
Anion gap: 9 (ref 5–15)
BUN: 9 mg/dL (ref 8–23)
CO2: 20 mmol/L — ABNORMAL LOW (ref 22–32)
Calcium: 8.1 mg/dL — ABNORMAL LOW (ref 8.9–10.3)
Chloride: 110 mmol/L (ref 98–111)
Creatinine, Ser: 0.88 mg/dL (ref 0.44–1.00)
GFR, Estimated: >60 mL/min (ref >60)
Glucose, Bld: 78 mg/dL (ref 70–99)
Potassium: 3.5 mmol/L (ref 3.5–5.1)
Sodium: 139 mmol/L (ref 135–145)

## 2022-06-18 LAB — MAGNESIUM: Magnesium: 1.9 mg/dL (ref 1.7–2.4)

## 2022-06-18 MED ORDER — ORAL CARE MOUTH RINSE: 15.0000 mL | OROMUCOSAL | Status: DC | PRN

## 2022-06-18 MED ORDER — GERHARDT'S BUTT CREAM: 1.0000 | TOPICAL_CREAM | Freq: Three times a day (TID) | CUTANEOUS | 0 refills | Status: AC

## 2022-06-18 MED ORDER — ENSURE ENLIVE PO LIQD: 237.0000 mL | Freq: Two times a day (BID) | ORAL | 0 refills | Status: DC

## 2022-06-18 MED ORDER — LOPERAMIDE HCL 2 MG PO TABS: 2.0000 mg | ORAL_TABLET | Freq: Four times a day (QID) | ORAL | 0 refills | Status: DC | PRN

## 2022-06-18 NOTE — Progress Notes (Cosign Needed)
    Durable Medical Equipment  (From admission, onward)           Start     Ordered   06/18/22 1320  For home use only DME Hospital bed  Once       Question Answer Comment  Length of Need Lifetime   Patient has (list medical condition): stroke with deficit   Bed type Semi-electric      06/18/22 1319

## 2022-06-18 NOTE — Plan of Care (Signed)
  Problem: Health Behavior/Discharge Planning: Goal: Ability to manage health-related needs will improve Outcome: Progressing   Problem: Clinical Measurements: Goal: Diagnostic test results will improve Outcome: Progressing Goal: Respiratory complications will improve Outcome: Progressing

## 2022-06-18 NOTE — Plan of Care (Signed)
  Problem: Health Behavior/Discharge Planning: Goal: Ability to manage health-related needs will improve 06/18/2022 1312 by Luiz Iron, RN Outcome: Adequate for Discharge 06/18/2022 1029 by Luiz Iron, RN Outcome: Progressing   Problem: Clinical Measurements: Goal: Ability to maintain clinical measurements within normal limits will improve Outcome: Adequate for Discharge Goal: Will remain free from infection Outcome: Adequate for Discharge Goal: Diagnostic test results will improve 06/18/2022 1312 by Luiz Iron, RN Outcome: Adequate for Discharge 06/18/2022 1029 by Luiz Iron, RN Outcome: Progressing Goal: Respiratory complications will improve 06/18/2022 1312 by Luiz Iron, RN Outcome: Adequate for Discharge 06/18/2022 1029 by Luiz Iron, RN Outcome: Progressing Goal: Cardiovascular complication will be avoided Outcome: Adequate for Discharge   Problem: Activity: Goal: Risk for activity intolerance will decrease Outcome: Adequate for Discharge   Problem: Nutrition: Goal: Adequate nutrition will be maintained Outcome: Adequate for Discharge   Problem: Coping: Goal: Level of anxiety will decrease Outcome: Adequate for Discharge   Problem: Elimination: Goal: Will not experience complications related to bowel motility Outcome: Adequate for Discharge Goal: Will not experience complications related to urinary retention Outcome: Adequate for Discharge   Problem: Pain Managment: Goal: General experience of comfort will improve Outcome: Adequate for Discharge   Problem: Safety: Goal: Ability to remain free from injury will improve Outcome: Adequate for Discharge   Problem: Skin Integrity: Goal: Risk for impaired skin integrity will decrease Outcome: Adequate for Discharge

## 2022-06-18 NOTE — TOC Transition Note (Signed)
Transition of Care [facility]) - CM/SW Discharge Note   Patient Details  Name: Jenny Kelly MRN: [id number] Date of Birth: 1945-05-12  [facility]) CM/SW Contact:  Tom-Johnson, Renea Ee, RN Phone Number: 06/18/2022, 1:26 PM   Clinical Narrative:     Patient is scheduled for discharge today. Home Health referral called in to [facility] per patient and Husband's request. Tommi Rumps voiced acceptance and info on AVS. Patient and husband requested Hospital bed, ordered from [facility] and will be delivered to patient's address. Outpatient f/u and discharge instructions on AVS. Husband to transport at discharge. No further TOC needs noted.          Final next level of care: Home w Home Health Services Barriers to Discharge: Barriers Resolved   Patient Goals and CMS Choice CMS Medicare.gov Compare Post Acute Care list provided to:: Patient Choice offered to / list presented to : Patient, Spouse  Discharge Placement                  Patient to be transferred to facility by: Husband      Discharge Plan and Services Additional resources added to the After Visit Summary for                  DME Arranged: Hospital bed DME Agency: [employer] Date DME Agency Contacted: 06/18/22 Time DME Agency Contacted: [phone] Representative spoke with at DME Agency: Maudie Mercury HH Arranged: PT, OT, RN, Disease Management, Nurse's Aide HH Agency: [place] Date [place]: 06/18/22 Time [place]: 63 Representative spoke with at [place]: [place] Determinants of Health ([place]) Interventions SDOH Screenings   Food Insecurity: No Food Insecurity (06/17/2022)  Housing: Low Risk  (06/17/2022)  Transportation Needs: No Transportation Needs (06/17/2022)  Utilities: Not At Risk (06/17/2022)  Depression (PHQ2-9): Low Risk  (01/08/2022)  Tobacco Use: Medium Risk (06/16/2022)     Readmission Risk Interventions     No data to display

## 2022-06-18 NOTE — Progress Notes (Signed)
Patient and her husband were given discharge instructions and stated understanding.

## 2022-06-18 NOTE — Progress Notes (Signed)
Speech Language Pathology Treatment: Dysphagia  Patient Details Name: Jenny Kelly MRN: [id number] DOB: [birth year] [name]: 06/18/2022 Time: [id number] SLP Time Calculation (min) (ACUTE ONLY): 14 min  Assessment / Plan / Recommendation Clinical Impression  Pt was seen for dysphagia treatment and was cooperative throughout the session. Pt, nursing, and her spouse reported that the pt has been tolerating the current diet without overt s/sx of aspiration. Pt stated that she has appreciated the upgraded diet since she "doesn't like food too mushy". Pt's husband added that the pt tolerated breakfast well and even ate some of the sausage which she does not typically like. Pt tolerated dysphagia 3 solids, dual consistency boluses, and thin liquids via straw using individual and consecutive swallows without s/s of aspiration. Mastication was mildly prolonged due to pt's limited dentition, but this was functional and oral clearance was adequate. It is recommended that the current diet of dysphagia 3 solids and thin liquids be continued. Further skilled SLP services are not clinically indicated at this time.    HPI HPI: Patient is a 77 y.o. female with PMH: COPD, pulmonary fibrosis, seizure disorder, h/o CVA with residual right sided weakness/paresis, h/o cluster HA and migraines, dementia, a-fib, CKD stage 3a. She presented to the hospital on 06/16/22 with diarrhea and weakness, unable to keep food down but keeping fluids down and taking medications. In ER she reported some abdominal discomfort. Husband noticed some increased coughing after drinking and/or eating and diet changed to Dys 2 solids, SLP evaluation of swallow ordered.      SLP Plan  All goals met;Discharge SLP treatment due to (comment)      Recommendations for follow up therapy are one component of a multi-disciplinary discharge planning process, led by the attending physician.  Recommendations may be updated based on patient status,  additional functional criteria and insurance authorization.    Recommendations  Diet recommendations: Dysphagia 3 (mechanical soft);Thin liquid Liquids provided via: Cup;Straw Medication Administration: Whole meds with puree Supervision: Staff to assist with self feeding Compensations: Slow rate;Small sips/bites Postural Changes and/or Swallow Maneuvers: Seated upright 90 degrees                Oral Care Recommendations: Oral care BID Follow Up Recommendations: No SLP follow up SLP Visit Diagnosis: Dysphagia, unspecified (R13.10) Plan: All goals met;Discharge SLP treatment due to (comment)          Hernan Turnage I. Hardin Negus, [place], [place] Office number [phone]  Horton Marshall  06/18/2022, 11:26 AM

## 2022-06-18 NOTE — Progress Notes (Signed)
Physical Therapy Treatment Patient Details Name: Jenny Kelly MRN: [id number] DOB: Dec 06, 1945 [name]: 06/18/2022   History of Present Illness This is a 77 year old female  Patient presents due to diarrhea and weakness; with PMHx of A. fib (on xarelto), CKD stage 3a, COPD, pulmonary fibrosis, seizure disorder, h/o CVA w/ residual right sided weakness, hx of cluster HA and migraines.    PT Comments    Pt progressing towards physical therapy goals. Was able to perform transfers and ambulation with up to mod assist and rollator for support. Husband present and supportive throughout. PT assisted pt to dress. Pt and husband anticipate d/c home this afternoon. Will continue to follow.     Recommendations for follow up therapy are one component of a multi-disciplinary discharge planning process, led by the attending physician.  Recommendations may be updated based on patient status, additional functional criteria and insurance authorization.  Follow Up Recommendations  Home health PT     Assistance Recommended at Discharge Frequent or constant Supervision/Assistance  Patient can return home with the following A lot of help with walking and/or transfers;Assistance with cooking/housework;A little help with bathing/dressing/bathroom;Help with stairs or ramp for entrance   Equipment Recommendations  None recommended by PT (pretty well-equipped)    Recommendations for Other Services       Precautions / Restrictions Precautions Precautions: Fall Precaution Comments: Photosensitivity Restrictions Weight Bearing Restrictions: No     Mobility  Bed Mobility Overal bed mobility: Needs Assistance Bed Mobility: Supine to Sit, Sit to Supine     Supine to sit: Min assist Sit to supine: Mod assist   General bed mobility comments: Assist for trunk elevation to full sitting position and for LE elevation back up into bed at end of session.    Transfers Overall transfer level: Needs  assistance Equipment used: Rollator (4 wheels) Transfers: Sit to/from Stand, Bed to chair/wheelchair/BSC Sit to Stand: Min assist   Step pivot transfers: Min assist       General transfer comment: Assist for power up to full stand as well as for walker management. Pt able to take pivotal steps around as well as lateral steps at bedside.    Ambulation/Gait Ambulation/Gait assistance: Min assist Gait Distance (Feet): 15 Feet Assistive device: Rollator (4 wheels) Gait Pattern/deviations: Step-through pattern, Decreased stride length, Trunk flexed, Narrow base of support Gait velocity: Decreased Gait velocity interpretation: <1.31 ft/sec, indicative of household ambulator   General Gait Details: VC's for improved posture, closer walker proximity, and forward gaze. Assist for walker management and occasional balance assist.   Stairs             Wheelchair Mobility    Modified Rankin (Stroke Patients Only)       Balance Overall balance assessment: Needs assistance Sitting-balance support: No upper extremity supported, Feet supported Sitting balance-Leahy Scale: Fair     Standing balance support: Bilateral upper extremity supported, During functional activity, Reliant on assistive device for balance Standing balance-Leahy Scale: Poor                              Cognition Arousal/Alertness: Awake/alert Behavior During Therapy: WFL for tasks assessed/performed Overall Cognitive Status: History of cognitive impairments - at baseline                                          Exercises  General Comments        Pertinent Vitals/Pain Pain Assessment Pain Assessment: Faces Faces Pain Scale: Hurts even more Pain Location: R arm Pain Descriptors / Indicators: Sore, Discomfort Pain Intervention(s): Limited activity within patient's tolerance, Monitored during session, Repositioned    Home Living                           Prior Function            PT Goals (current goals can now be found in the care plan section) Acute Rehab PT Goals Patient Stated Goal: Home this afternoon. PT Goal Formulation: With patient Time For Goal Achievement: 07/01/22 Potential to Achieve Goals: Good Progress towards PT goals: Progressing toward goals    Frequency    Min 3X/week      PT Plan Current plan remains appropriate    Co-evaluation              AM-PAC PT "6 Clicks" Mobility   Outcome Measure  Help needed turning from your back to your side while in a flat bed without using bedrails?: A Little Help needed moving from lying on your back to sitting on the side of a flat bed without using bedrails?: A Little Help needed moving to and from a bed to a chair (including a wheelchair)?: A Little Help needed standing up from a chair using your arms (e.g., wheelchair or bedside chair)?: A Little Help needed to walk in hospital room?: A Little Help needed climbing 3-5 steps with a railing? : Total 6 Click Score: 16    End of Session Equipment Utilized During Treatment: Gait belt Activity Tolerance: Patient tolerated treatment well Patient left: in bed;with call bell/phone within reach;with bed alarm set;with family/visitor present Nurse Communication: Mobility status PT Visit Diagnosis: Unsteadiness on feet (R26.81);Other abnormalities of gait and mobility (R26.89);Muscle weakness (generalized) (M62.81)     Time: [id number] PT Time Calculation (min) (ACUTE ONLY): 28 min  Charges:  $Gait Training: 8-22 mins $Therapeutic Activity: 8-22 mins                     Rolinda Roan, PT, DPT Acute Rehabilitation Services Secure Chat Preferred Office: [phone]    Thelma Comp 06/18/2022, 3:52 PM

## 2022-06-19 DIAGNOSIS — E860 Dehydration: Secondary | ICD-10-CM | POA: Diagnosis not present

## 2022-06-19 DIAGNOSIS — I509 Heart failure, unspecified: Secondary | ICD-10-CM | POA: Diagnosis not present

## 2022-06-19 DIAGNOSIS — E1151 Type 2 diabetes mellitus with diabetic peripheral angiopathy without gangrene: Secondary | ICD-10-CM | POA: Diagnosis not present

## 2022-06-19 DIAGNOSIS — R197 Diarrhea, unspecified: Secondary | ICD-10-CM | POA: Diagnosis not present

## 2022-06-19 DIAGNOSIS — E876 Hypokalemia: Secondary | ICD-10-CM | POA: Diagnosis not present

## 2022-06-19 DIAGNOSIS — I480 Paroxysmal atrial fibrillation: Secondary | ICD-10-CM | POA: Diagnosis not present

## 2022-06-19 DIAGNOSIS — E1122 Type 2 diabetes mellitus with diabetic chronic kidney disease: Secondary | ICD-10-CM | POA: Diagnosis not present

## 2022-06-19 DIAGNOSIS — I2510 Atherosclerotic heart disease of native coronary artery without angina pectoris: Secondary | ICD-10-CM | POA: Diagnosis not present

## 2022-06-19 NOTE — Discharge Summary (Signed)
Physician Discharge Summary   Patient: Jenny Kelly MRN: [id number] DOB: March 23, 1946  Admit date:     06/16/2022  Discharge date: 06/18/2022  Discharge Physician: Berle Mull  PCP: Townsend Roger, MD  Recommendations at discharge:  Follow up with PCP in 1 week   Follow-up Information     [name], Corene Cornea, MD. Schedule an appointment as soon as possible for a visit in 1 week(s).   Specialty: Internal Medicine Contact information: [place] [address] [phone]         Care, [facility] Follow up.   Specialty: Home Health Services Why: Someone will call you to schedule first home visit. If you have not received a call after two days of discharging home, call their number listed. If no one comes to assess, call Case Manager at [phone]. Contact information: [address] [address] [phone]                 Unresulted Labs (From admission, onward)     Start     Ordered   06/17/22 0500  CBC with Differential/Platelet  Tomorrow morning,   R        06/16/22 1951            Discharge Diagnoses: Principal Problem:   Hypokalemia Active Problems:   History of stroke   PAF (paroxysmal atrial fibrillation) (HCC)   Hypothyroidism   Coronary artery disease involving native coronary artery of native heart   COPD (chronic obstructive pulmonary disease) (HCC)   Diarrhea   Dehydration  Hospital Course: 77-year-old female with PMHx of A. fib (on xarelto), CKD stage 3a, COPD, pulmonary fibrosis, seizure disorder, h/o CVA w/ residual right sided weakness, hx of cluster HA and migraines.  Patient presents due to diarrhea and weakness.   C. difficile is negative.  Assessment and Plan  Severe hypokalemia. Hypomagnesemia. Prolonged QTc. Acute evaluation secondary to potassium. Electrolyte imbalance secondary to diarrhea. Currently being treated with LR with potassium as well as supplement. Level still remains low  therefore we will replace. Monitor.   Diarrhea. C. difficile is negative. Most likely viral gastroenteritis. Continue Imodium.  Add 1 dose of Questran. No abdominal pain.   History of CVA with residual weakness. On dysphagia diet currently. Patient does not use any dysphagia diet at home. Will advance to dysphagia 3 diet.   CAD. PAF. On aspirin and Xarelto. Will continue.   HLD. Unable to tolerate statin. On Zetia.   Dementia, no behavioral issues. On Aricept we will continue. Mentation actually is much clear, patient is able to provide adequate history.   COPD. Chronic pulmonary fibrosis. Chronic respiratory failure. Not on any oxygen at home. CRP chronically elevated.   Sacral excoriation. Present on admission. Secondary to diarrhea.   Hypothyroidism. Continue Synthroid. TSH not significantly suppressed to suspect hyperthyroidism causing diarrhea.  Consultants:  none  Procedures performed:  none  DISCHARGE MEDICATION: Allergies as of 06/18/2022       Reactions   Ciprofloxacin Hives   Ask patient   Prochlorperazine Edisylate Other (See Comments)   Cant swallow Muscle cramping   Morphine Other (See Comments)   Confusion   Prednisone Other (See Comments)   unknown   Reduced Iso-alpha Acids Complex Other (See Comments)   unknown   Statins Other (See Comments)   Muscle weakness   Sulfa Antibiotics Swelling   Azithromycin Rash   Latex Rash        Medication  List     STOP taking these medications    labetalol 100 MG tablet Commonly known as: NORMODYNE   montelukast 10 MG tablet Commonly known as: SINGULAIR       TAKE these medications    acetaminophen 500 MG tablet Commonly known as: TYLENOL Take 500 mg by mouth every 6 (six) hours as needed for mild pain or moderate pain.   aspirin EC 81 MG tablet Take 1 tablet (81 mg total) by mouth daily. Swallow whole.   donepezil 10 MG tablet Commonly known as: ARICEPT Take 1 tablet (10 mg  total) by mouth at bedtime.   feeding supplement Liqd Take 237 mLs by mouth 2 (two) times daily between meals.   Gerhardt's butt cream Crea Apply 1 Application topically 3 (three) times daily.   guaiFENesin 600 MG 12 hr tablet Commonly known as: MUCINEX Take 600 mg by mouth daily as needed for cough or to loosen phlegm.   guaiFENesin-dextromethorphan 100-10 MG/5ML syrup Commonly known as: ROBITUSSIN DM Take 10 mLs by mouth every 4 (four) hours as needed for cough.   levothyroxine 88 MCG tablet Commonly known as: SYNTHROID Take 88 mcg by mouth daily before breakfast.   loperamide 2 MG tablet Commonly known as: Imodium A-D Take 1 tablet (2 mg total) by mouth 4 (four) times daily as needed for diarrhea or loose stools.   omeprazole 40 MG capsule Commonly known as: PRILOSEC Take 1 capsule (40 mg total) by mouth daily. What changed: when to take this   pregabalin 75 MG capsule Commonly known as: LYRICA Take 1 capsule (75 mg total) by mouth 3 (three) times daily.   PROBIOTIC PO Take 15 Billion Cells by mouth daily.   Rivaroxaban 15 MG Tabs tablet Commonly known as: XARELTO Take 1 tablet (15 mg total) by mouth daily.   SYSTANE COMPLETE OP Apply 1 drop to eye as needed (Burning/Irritation).   topiramate 200 MG tablet Commonly known as: TOPAMAX Take 1 tablet (200 mg total) by mouth 2 (two) times daily.       Disposition: Home Diet recommendation: Cardiac diet  Discharge Exam: Vitals:   06/17/22 1704 06/17/22 2127 06/18/22 0530 06/18/22 0935  BP: (!) 115/96 (!) 119/57 (!) 107/38 (!) 104/55  Pulse: 91 (!) 54 (!) 59 62  Resp: '18 15 15 18  '$ Temp: 98.9 F (37.2 C) 98.4 F (36.9 C) 97.6 F (36.4 C) 98.2 F (36.8 C)  TempSrc: Oral  Oral   SpO2: 98% 99% 98% 96%  Weight:      Height:       General: Appear in no distress; no visible Abnormal Neck Mass Or lumps, Conjunctiva normal Cardiovascular: S1 and S2 Present, no Murmur, Respiratory: good respiratory effort,  Bilateral Air entry present and CTA, no Crackles, no wheezes Abdomen: Bowel Sound present, Non tender  Extremities: no Pedal edema Neurology: alert and oriented to time, place, and person chronic hemiparesis Filed Weights   06/16/22 1424 06/16/22 1730  Weight: 53.5 kg 53 kg   Condition at discharge: stable  The results of significant diagnostics from this hospitalization (including imaging, microbiology, ancillary and laboratory) are listed below for reference.   Imaging Studies: DG Foot 2 Views Right  Result Date: 06/16/2022 CLINICAL DATA:  5th metatarsal pain EXAM: RIGHT FOOT - 2 VIEW COMPARISON:  None Available. FINDINGS: No acute fracture or dislocation. Severe osteoarthritis of the first MTP joint. Mild-to-moderate degenerative changes within the midfoot. Plantar calcaneal spur. There is mild soft tissue prominence lateral to the fifth  MTP joint, nonspecific. IMPRESSION: 1. No acute osseous abnormality. 2. Severe osteoarthritis of the first MTP joint. 3. Mild soft tissue prominence lateral to the fifth MTP joint, nonspecific. Electronically Signed   By: Davina Poke D.O.   On: 06/16/2022 16:42   DG Ankle Complete Right  Result Date: 06/16/2022 CLINICAL DATA:  Pain EXAM: RIGHT ANKLE - COMPLETE 3 VIEW COMPARISON:  None Available. FINDINGS: No acute fracture, dislocation or subluxation. Soft tissues are unremarkable. There is a plantar calcaneal spur. Ankle mortise Joint space narrowing. IMPRESSION: Osteoarthritis of the ankle mortise. Plantar calcaneal spur. No acute osseous abnormalities. Electronically Signed   By: Sammie Bench M.D.   On: 06/16/2022 16:15    Microbiology: Results for orders placed or performed during the hospital encounter of 06/16/22  C Difficile Quick Screen w PCR reflex     Status: None   Collection Time: 06/16/22  3:08 PM   Specimen: STOOL  Result Value Ref Range Status   C Diff antigen NEGATIVE NEGATIVE Final   C Diff toxin NEGATIVE NEGATIVE Final   C  Diff interpretation No C. difficile detected.  Final    Comment: Performed at [facility], [place] [address], [place], [place] [address]   Labs: CBC: Recent Labs  Lab 06/16/22 1506 06/17/22 0608 06/18/22 0744  WBC 14.3* 9.8 8.7  NEUTROABS 10.1* 4.9  --   HGB 12.6 13.3 11.4*  HCT 41.4 42.1 38.3  MCV 84.1 81.9 85.1  PLT 324 278 [id number]   Basic Metabolic Panel: Recent Labs  Lab 06/16/22 1506 06/17/22 0608 06/17/22 1545 06/18/22 0744  NA 140 139 137 139  K 2.1* 2.0* 2.7* 3.5  CL 108 105 104 110  CO2 20* 23 21* 20*  GLUCOSE 135* 100* 123* 78  BUN '18 14 12 9  '$ CREATININE 1.05* 0.96 0.94 0.88  CALCIUM 8.7* 8.6* 8.2* 8.1*  MG 1.7  --  2.0 1.9   Liver Function Tests: Recent Labs  Lab 06/16/22 1506  AST 17  ALT 8  ALKPHOS 89  BILITOT 0.6  PROT 7.1  ALBUMIN 2.6*   CBG: Recent Labs  Lab 06/17/22 0725 06/17/22 1207 06/17/22 1700  GLUCAP 81 93 119*    Discharge time spent: greater than 30 minutes.  Signed: Berle Mull, MD [facility] 06/18/2022

## 2022-06-20 DIAGNOSIS — E1151 Type 2 diabetes mellitus with diabetic peripheral angiopathy without gangrene: Secondary | ICD-10-CM | POA: Diagnosis not present

## 2022-06-20 DIAGNOSIS — E876 Hypokalemia: Secondary | ICD-10-CM | POA: Diagnosis not present

## 2022-06-20 DIAGNOSIS — I480 Paroxysmal atrial fibrillation: Secondary | ICD-10-CM | POA: Diagnosis not present

## 2022-06-20 DIAGNOSIS — R197 Diarrhea, unspecified: Secondary | ICD-10-CM | POA: Diagnosis not present

## 2022-06-20 DIAGNOSIS — I2510 Atherosclerotic heart disease of native coronary artery without angina pectoris: Secondary | ICD-10-CM | POA: Diagnosis not present

## 2022-06-20 DIAGNOSIS — I509 Heart failure, unspecified: Secondary | ICD-10-CM | POA: Diagnosis not present

## 2022-06-20 DIAGNOSIS — E1122 Type 2 diabetes mellitus with diabetic chronic kidney disease: Secondary | ICD-10-CM | POA: Diagnosis not present

## 2022-06-20 DIAGNOSIS — E860 Dehydration: Secondary | ICD-10-CM | POA: Diagnosis not present

## 2022-06-22 DIAGNOSIS — E876 Hypokalemia: Secondary | ICD-10-CM | POA: Diagnosis not present

## 2022-06-22 DIAGNOSIS — R197 Diarrhea, unspecified: Secondary | ICD-10-CM | POA: Diagnosis not present

## 2022-06-22 DIAGNOSIS — I480 Paroxysmal atrial fibrillation: Secondary | ICD-10-CM | POA: Diagnosis not present

## 2022-06-22 DIAGNOSIS — E1122 Type 2 diabetes mellitus with diabetic chronic kidney disease: Secondary | ICD-10-CM | POA: Diagnosis not present

## 2022-06-22 DIAGNOSIS — E860 Dehydration: Secondary | ICD-10-CM | POA: Diagnosis not present

## 2022-06-22 DIAGNOSIS — I2510 Atherosclerotic heart disease of native coronary artery without angina pectoris: Secondary | ICD-10-CM | POA: Diagnosis not present

## 2022-06-22 DIAGNOSIS — I509 Heart failure, unspecified: Secondary | ICD-10-CM | POA: Diagnosis not present

## 2022-06-22 DIAGNOSIS — E1151 Type 2 diabetes mellitus with diabetic peripheral angiopathy without gangrene: Secondary | ICD-10-CM | POA: Diagnosis not present

## 2022-06-27 ENCOUNTER — Other Ambulatory Visit: Payer: Self-pay | Admitting: Internal Medicine

## 2022-06-27 ENCOUNTER — Encounter: Payer: Self-pay | Admitting: Internal Medicine

## 2022-06-27 ENCOUNTER — Ambulatory Visit: Payer: Medicare HMO | Admitting: Internal Medicine

## 2022-06-27 VITALS — BP 112/72 | HR 78 | Temp 97.2°F | Resp 18 | Ht 62.0 in | Wt 120.6 lb

## 2022-06-27 DIAGNOSIS — R519 Headache, unspecified: Secondary | ICD-10-CM | POA: Diagnosis not present

## 2022-06-27 DIAGNOSIS — E7800 Pure hypercholesterolemia, unspecified: Secondary | ICD-10-CM | POA: Diagnosis not present

## 2022-06-27 DIAGNOSIS — N1832 Chronic kidney disease, stage 3b: Secondary | ICD-10-CM

## 2022-06-27 MED ORDER — ACETAMINOPHEN-CODEINE 300-30 MG PO TABS: 0.5000 | ORAL_TABLET | Freq: Two times a day (BID) | ORAL | 1 refills | Status: DC | PRN

## 2022-06-27 MED ORDER — TOPIRAMATE 200 MG PO TABS: 200.0000 mg | ORAL_TABLET | Freq: Two times a day (BID) | ORAL | 0 refills | Status: DC

## 2022-06-27 NOTE — Assessment & Plan Note (Signed)
She had labs recently and her kidney function was actually doing well.  I am going to hold off on farixga for now and we will continue to monitor her kidney function.  She is not to take any NSAIDS.

## 2022-06-27 NOTE — Assessment & Plan Note (Addendum)
She has post stroke headaches that were stable for years until last Fall where she admitted with intractable headaches.  She was on lyrica for years which controlled this.   She is on topamax as well.  I felt she had a vascular component to this headache so I started her on low dose labatolol and increased her lyrica.  The labatealol did not help and she stopped it.  I am going to continue the lyrica but add Tylenol #3 with caffeine to see if this helps with her headache.  She needs to check her BP while on this new medication and call us if she has lower blood pressure.  I am going to refer her to [facility] neurology.

## 2022-06-27 NOTE — Progress Notes (Signed)
Office Visit  Subjective   Patient ID: Jenny Kelly   DOB: 11/18/1945   Age: 77 y.o.   MRN: [id number]   Chief Complaint Chief Complaint  Patient presents with   Hospitalization Follow-up     History of Present Illness Jenny Kelly. Fall returns today for routine followup on her cholesterol.  On her last visit, her cholesterol was elevated but we did not put her on any meds due to her complications of her medications.  This past year, her cholesterol was elevated and I increased her pravastatin from '40mg'$  to '80mg'$  daily.  Unfortunately, she had myalgias from this and discontinued her pravastatin.  She just saw Dr. Raliegh Ip in cardiology on 03/09/2022 and he wanted her to started zetia.  She did not do this as she has been on zetia before and this has caused severe diarrhea.  I wanted her to start nexletol for her high cholesterol as she has an intolerance to statins.  She never did start the medicine as she was afraid of catching pancreatitis again.  Again, she stopped praulent over a year ago as it may have caused her pancreatitis.  Overall, she states she is doing well and is without any complaints or problems at this time. She has a history of statin intolerance where she has had myalgias from zocor, lipitor, pravastatin, and crestor. She was also tried on welchol and she did not like taking this. She specifically denies abdominal pain, nausea, vomiting, diarrhea, myalgias, and fatigue. She remains on dietary management at this time.  She was on fenofibrate but they stopped this as it could have caused her pancreatitis.  After her stroke in 04/2021, I started her on pravastatin '40mg'$  qhs but again she stopped this due to myalgias..  She tries to control her cholesterol with diet and she has now quit fish oil.  She is fasting in anticipation for labs today.   The patient is a 77 year old Caucasian/White female who returns for a follow-up visit for her diet controlled diabetes. Since the last visit, there have  been no problems. She remains on no medications; the diabetes is being managed by dietary intervention alone. She is not walking as much as they would like.. She specifically denies unexplained abdominal pain, nausea or vomiting and documented hypoglycemia. She has not been checking her blood sugars at home as they do not have a glucometer. She came in fasting today in anticipation of lab work. Her last HgBA1c was done in 08/2021 and was 6.1%.  There are no complications of diabetic retinopathy or neuropathy.  She has CKD probably secondary to her HTN and diabetic nephropathy.  She has a history of cardiovascular diease as well and history of stroke but her diabetes has always been controlled.    The patient has a history of Stage IIIb CKD.  She was seeing the nephrologist once every 3-4 months with her last visit was in 2019 and she states that she is not going back.  Her creatinine at that time was 1.5.  Her GFR is around from 30-50 however she had recent labs that showed her GFR was greater than 60.   We have noted that she went from Stage IIIa to Stage IIIb CKD over the past 1-2 years.  We have not started her on farixga as of yet.   Jenny Kelly is a 77 yo female who also returns for her history of post stroke headaches.  I saw her 3 months ago where her headaches  had worsened.  At that time I felt she had a vascular component to her headaches.  She was taking oxycodone given to her by [facility] for her headaches and I asked her to stop this.  I did place her on low dose labetalol for headache prevention and increased her lyrica from '75mg'$  BID to '75mg'$  TID.  Today, her husband thinks her headaches are doing better and she is adding tyelnol '650mg'$  at times to help.  Again, she was admitted to [facility] in 12/2021 for intractable headaches.  She has a history of post stroke headaches where I have placed her on lyrica in the past which has helped this.  She developed a severe bitemporal headache (like a band across her  forehead)  on the day of admission where she initially thought she was having a stroke and 911 was called.  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  Neurology was consulted and the patient underwent a MRI and Ct scans of the brain which were negative.  They did a temporal artery [place] which was negative.  Neurology recommended a CT myelogram which showed a CSF contrast extending into the right L4-L5 foramen.  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  She continued to have have symptoms and she went for a repeat blood patch and her symptoms improved.  The doctors wanted her to followup with [facility] neurology as an outpatient.  She has not followed up with Neurology.   Today, she states her headaches are still occurring daily and she is taking her lyrica '75mg'$  in AM and PM but she takes an extra dose in the afternoon.  She states tylenol does help.  There is no new weakness/numbness, change in her vision or other problems.  Neurology repeated a repeat CT scan of her head in 05/2015 and this showed an old stroke but nothing new. She did have an overnight EEG done on 12/30/2021 and this showed moderate diffuse encephalopathy with nonspecific etiology but no seizures were noted.  She had a MRI of her brain on 12/27/2021 which showed no acute intracranial abnormality but there were old unchanged signal abnormality in her right basal ganglia related to prior infarct.  There was encephalomalacia involving the right MCA territory including the right basal ganglia.  There was mild chronic microvascular disease.        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   Allergy 06/09/2021   Anemia    Anxiety     Arthritis    Asthma    Atrial fibrillation ([place])    Blood transfusion without reported diagnosis    Cataract    CHF (congestive heart failure) (HCC)    Chronic kidney disease    Clotting disorder (HCC)    COPD (chronic obstructive pulmonary disease) (HCC)    Dementia ([place])    Depression    Diabetes mellitus without complication (HCC)    Dyspnea    GERD (gastroesophageal reflux disease)    Great toe pain, left 11/01/2021   Headache    Heart murmur    History of CVA with residual deficit 11/01/2021   History of kidney stones  Hyperlipidemia    Hypothyroidism    Myocardial infarction [facility])    Neuromuscular disorder ([place])    tremors   Pneumonia due to COVID-19 virus    Postoperative wound infection 03/10/2014   Seizures ([place])    Sleep apnea    Stroke (HCC)    Tension-type headache, not intractable 01/10/2016   Thyroid disease    Wide-complex tachycardia      Allergies Allergies  Allergen Reactions   Ciprofloxacin Hives    Ask patient   Prochlorperazine Edisylate Other (See Comments)    Cant swallow Muscle cramping   Morphine Other (See Comments)    Confusion   Prednisone Other (See Comments)    unknown   Reduced Iso-Alpha Acids Complex Other (See Comments)    unknown   Statins Other (See Comments)    Muscle weakness    Sulfa Antibiotics Swelling   Azithromycin Rash   Latex Rash     Medications  Current Outpatient Medications:    amiodarone (PACERONE) 100 MG tablet, Take 100 mg by mouth., Disp: , Rfl:    cyanocobalamin (VITAMIN B12) 1000 MCG tablet, Take 1,000 mcg by mouth daily., Disp: , Rfl:    nitroGLYCERIN (NITROSTAT) 0.4 MG SL tablet, Place 0.4 mg under the tongue every 5 (five) minutes as needed., Disp: , Rfl:    acetaminophen (TYLENOL) 500 MG tablet, Take 500 mg by mouth every 6 (six) hours as needed for mild pain or moderate pain., Disp: , Rfl:    aspirin 81 MG EC tablet, Take 1 tablet (81 mg total) by mouth daily. Swallow whole., Disp: 30  tablet, Rfl: 0   donepezil (ARICEPT) 10 MG tablet, Take 1 tablet (10 mg total) by mouth at bedtime., Disp: 30 tablet, Rfl: 0   feeding supplement (ENSURE ENLIVE / ENSURE PLUS) LIQD, Take 237 mLs by mouth 2 (two) times daily between meals., Disp: 10000 mL, Rfl: 0   guaiFENesin (MUCINEX) 600 MG 12 hr tablet, Take 600 mg by mouth daily as needed for cough or to loosen phlegm., Disp: , Rfl:    guaiFENesin-dextromethorphan (ROBITUSSIN DM) 100-10 MG/5ML syrup, Take 10 mLs by mouth every 4 (four) hours as needed for cough., Disp: 118 mL, Rfl: 0   levothyroxine (SYNTHROID) 88 MCG tablet, Take 88 mcg by mouth daily before breakfast., Disp: , Rfl:    loperamide (IMODIUM A-D) 2 MG tablet, Take 1 tablet (2 mg total) by mouth 4 (four) times daily as needed for diarrhea or loose stools., Disp: 30 tablet, Rfl: 0   Nystatin (GERHARDT'S BUTT CREAM) CREA, Apply 1 Application topically 3 (three) times daily., Disp: 1 each, Rfl: 0   omeprazole (PRILOSEC) 40 MG capsule, Take 1 capsule (40 mg total) by mouth daily. (Patient taking differently: Take 40 mg by mouth 2 (two) times daily.), Disp: 30 capsule, Rfl: 0   pregabalin (LYRICA) 75 MG capsule, Take 1 capsule (75 mg total) by mouth 3 (three) times daily., Disp: 90 capsule, Rfl: 2   Probiotic Product (PROBIOTIC PO), Take 15 Billion Cells by mouth daily., Disp: , Rfl:    Propylene Glycol (SYSTANE COMPLETE OP), Apply 1 drop to eye as needed (Burning/Irritation)., Disp: , Rfl:    Rivaroxaban (XARELTO) 15 MG TABS tablet, Take 1 tablet (15 mg total) by mouth daily., Disp: 90 tablet, Rfl: 3   topiramate (TOPAMAX) 200 MG tablet, Take 1 tablet (200 mg total) by mouth 2 (two) times daily., Disp: 180 tablet, Rfl: 0   Review of Systems Review of Systems  Constitutional:  Negative  for chills and fever.  Eyes:  Positive for blurred vision. Negative for double vision.  Respiratory:  Negative for cough and shortness of breath.   Cardiovascular:  Negative for chest pain,  palpitations and leg swelling.  Gastrointestinal:  Negative for abdominal pain, constipation, diarrhea, nausea and vomiting.  Musculoskeletal:  Negative for myalgias.  Neurological:  Positive for headaches. Negative for dizziness and weakness.       Objective:    Vitals BP 112/72 (BP Location: Right Arm, Patient Position: Sitting, Cuff Size: Normal)   Pulse 78   Temp (!) 97.2 F (36.2 C) (Temporal)   Resp 18   Ht '5\' 2"'$  (1.575 m)   Wt 120 lb 9.6 oz (54.7 kg)   SpO2 93%   BMI 22.06 kg/m    Physical Examination Physical Exam Constitutional:      Appearance: Normal appearance. She is not ill-appearing.  Cardiovascular:     Rate and Rhythm: Normal rate and regular rhythm.     Pulses: Normal pulses.     Heart sounds: No murmur heard.    No friction rub. No gallop.  Pulmonary:     Effort: Pulmonary effort is normal. No respiratory distress.     Breath sounds: No wheezing, rhonchi or rales.  Abdominal:     General: Bowel sounds are normal. There is no distension.     Palpations: Abdomen is soft.     Tenderness: There is no abdominal tenderness.  Musculoskeletal:     Right lower leg: No edema.     Left lower leg: No edema.  Skin:    General: Skin is warm and dry.     Findings: No rash.  Neurological:     General: No focal deficit present.     Mental Status: She is alert and oriented to person, place, and time.  Psychiatric:        Mood and Affect: Mood normal.        Behavior: Behavior normal.        Assessment & Plan:   Stage 3b chronic kidney disease ([place]) She had labs recently and her kidney function was actually doing well.  I am going to hold off on farixga for now and we will continue to monitor her kidney function.  She is not to take any NSAIDS.  Hypercholesterolemia I know she has a history of CAD and stroke but she is statin intolerant and does not want to be on any meds.  I want her to eat healthy and keep active.  Headache She has post stroke headaches  that were stable for years until last Fall where she admitted with intractable headaches.  She was on lyrica for years which controlled this.   She is on topamax as well.  I felt she had a vascular component to this headache so I started her on low dose labatolol and increased her lyrica.  The labatealol did not help and she stopped it.  I am going to continue the lyrica but add Tylenol #3 with caffeine to see if this helps with her headache.  She needs to check her BP while on this new medication and call us if she has lower blood pressure.  I am going to refer her to [facility] neurology.    Return in about 3 months (around 09/27/2022) for annual.   Townsend Roger, MD

## 2022-06-27 NOTE — Assessment & Plan Note (Signed)
I know she has a history of CAD and stroke but she is statin intolerant and does not want to be on any meds.  I want her to eat healthy and keep active.

## 2022-06-28 DIAGNOSIS — E1151 Type 2 diabetes mellitus with diabetic peripheral angiopathy without gangrene: Secondary | ICD-10-CM | POA: Diagnosis not present

## 2022-06-28 DIAGNOSIS — E860 Dehydration: Secondary | ICD-10-CM | POA: Diagnosis not present

## 2022-06-28 DIAGNOSIS — I2510 Atherosclerotic heart disease of native coronary artery without angina pectoris: Secondary | ICD-10-CM | POA: Diagnosis not present

## 2022-06-28 DIAGNOSIS — I480 Paroxysmal atrial fibrillation: Secondary | ICD-10-CM | POA: Diagnosis not present

## 2022-06-28 DIAGNOSIS — E876 Hypokalemia: Secondary | ICD-10-CM | POA: Diagnosis not present

## 2022-06-28 DIAGNOSIS — I509 Heart failure, unspecified: Secondary | ICD-10-CM | POA: Diagnosis not present

## 2022-06-28 DIAGNOSIS — R197 Diarrhea, unspecified: Secondary | ICD-10-CM | POA: Diagnosis not present

## 2022-06-28 DIAGNOSIS — E1122 Type 2 diabetes mellitus with diabetic chronic kidney disease: Secondary | ICD-10-CM | POA: Diagnosis not present

## 2022-07-05 DIAGNOSIS — I2510 Atherosclerotic heart disease of native coronary artery without angina pectoris: Secondary | ICD-10-CM | POA: Diagnosis not present

## 2022-07-05 DIAGNOSIS — E1122 Type 2 diabetes mellitus with diabetic chronic kidney disease: Secondary | ICD-10-CM | POA: Diagnosis not present

## 2022-07-05 DIAGNOSIS — E876 Hypokalemia: Secondary | ICD-10-CM | POA: Diagnosis not present

## 2022-07-05 DIAGNOSIS — I480 Paroxysmal atrial fibrillation: Secondary | ICD-10-CM | POA: Diagnosis not present

## 2022-07-05 DIAGNOSIS — E1151 Type 2 diabetes mellitus with diabetic peripheral angiopathy without gangrene: Secondary | ICD-10-CM | POA: Diagnosis not present

## 2022-07-05 DIAGNOSIS — E860 Dehydration: Secondary | ICD-10-CM | POA: Diagnosis not present

## 2022-07-05 DIAGNOSIS — I509 Heart failure, unspecified: Secondary | ICD-10-CM | POA: Diagnosis not present

## 2022-07-05 DIAGNOSIS — R197 Diarrhea, unspecified: Secondary | ICD-10-CM | POA: Diagnosis not present

## 2022-07-12 DIAGNOSIS — E1151 Type 2 diabetes mellitus with diabetic peripheral angiopathy without gangrene: Secondary | ICD-10-CM | POA: Diagnosis not present

## 2022-07-12 DIAGNOSIS — I480 Paroxysmal atrial fibrillation: Secondary | ICD-10-CM | POA: Diagnosis not present

## 2022-07-12 DIAGNOSIS — I2510 Atherosclerotic heart disease of native coronary artery without angina pectoris: Secondary | ICD-10-CM | POA: Diagnosis not present

## 2022-07-12 DIAGNOSIS — E860 Dehydration: Secondary | ICD-10-CM | POA: Diagnosis not present

## 2022-07-12 DIAGNOSIS — R197 Diarrhea, unspecified: Secondary | ICD-10-CM | POA: Diagnosis not present

## 2022-07-12 DIAGNOSIS — E876 Hypokalemia: Secondary | ICD-10-CM | POA: Diagnosis not present

## 2022-07-12 DIAGNOSIS — I509 Heart failure, unspecified: Secondary | ICD-10-CM | POA: Diagnosis not present

## 2022-07-12 DIAGNOSIS — E1122 Type 2 diabetes mellitus with diabetic chronic kidney disease: Secondary | ICD-10-CM | POA: Diagnosis not present

## 2022-07-13 ENCOUNTER — Other Ambulatory Visit: Payer: Self-pay | Admitting: Internal Medicine

## 2022-07-18 DIAGNOSIS — E876 Hypokalemia: Secondary | ICD-10-CM | POA: Diagnosis not present

## 2022-07-18 DIAGNOSIS — R197 Diarrhea, unspecified: Secondary | ICD-10-CM | POA: Diagnosis not present

## 2022-07-18 DIAGNOSIS — E1151 Type 2 diabetes mellitus with diabetic peripheral angiopathy without gangrene: Secondary | ICD-10-CM | POA: Diagnosis not present

## 2022-07-18 DIAGNOSIS — I509 Heart failure, unspecified: Secondary | ICD-10-CM | POA: Diagnosis not present

## 2022-07-18 DIAGNOSIS — I2510 Atherosclerotic heart disease of native coronary artery without angina pectoris: Secondary | ICD-10-CM | POA: Diagnosis not present

## 2022-07-18 DIAGNOSIS — I480 Paroxysmal atrial fibrillation: Secondary | ICD-10-CM | POA: Diagnosis not present

## 2022-07-18 DIAGNOSIS — E1122 Type 2 diabetes mellitus with diabetic chronic kidney disease: Secondary | ICD-10-CM | POA: Diagnosis not present

## 2022-07-18 DIAGNOSIS — E860 Dehydration: Secondary | ICD-10-CM | POA: Diagnosis not present

## 2022-07-23 DIAGNOSIS — E1151 Type 2 diabetes mellitus with diabetic peripheral angiopathy without gangrene: Secondary | ICD-10-CM | POA: Diagnosis not present

## 2022-07-23 DIAGNOSIS — I2510 Atherosclerotic heart disease of native coronary artery without angina pectoris: Secondary | ICD-10-CM | POA: Diagnosis not present

## 2022-07-23 DIAGNOSIS — E860 Dehydration: Secondary | ICD-10-CM | POA: Diagnosis not present

## 2022-07-23 DIAGNOSIS — E876 Hypokalemia: Secondary | ICD-10-CM | POA: Diagnosis not present

## 2022-07-23 DIAGNOSIS — E1122 Type 2 diabetes mellitus with diabetic chronic kidney disease: Secondary | ICD-10-CM | POA: Diagnosis not present

## 2022-07-23 DIAGNOSIS — R197 Diarrhea, unspecified: Secondary | ICD-10-CM | POA: Diagnosis not present

## 2022-07-23 DIAGNOSIS — I509 Heart failure, unspecified: Secondary | ICD-10-CM | POA: Diagnosis not present

## 2022-07-23 DIAGNOSIS — I480 Paroxysmal atrial fibrillation: Secondary | ICD-10-CM | POA: Diagnosis not present

## 2022-08-01 DIAGNOSIS — I509 Heart failure, unspecified: Secondary | ICD-10-CM | POA: Diagnosis not present

## 2022-08-01 DIAGNOSIS — E1122 Type 2 diabetes mellitus with diabetic chronic kidney disease: Secondary | ICD-10-CM | POA: Diagnosis not present

## 2022-08-01 DIAGNOSIS — E876 Hypokalemia: Secondary | ICD-10-CM | POA: Diagnosis not present

## 2022-08-01 DIAGNOSIS — R197 Diarrhea, unspecified: Secondary | ICD-10-CM | POA: Diagnosis not present

## 2022-08-01 DIAGNOSIS — I2510 Atherosclerotic heart disease of native coronary artery without angina pectoris: Secondary | ICD-10-CM | POA: Diagnosis not present

## 2022-08-01 DIAGNOSIS — E1151 Type 2 diabetes mellitus with diabetic peripheral angiopathy without gangrene: Secondary | ICD-10-CM | POA: Diagnosis not present

## 2022-08-01 DIAGNOSIS — E860 Dehydration: Secondary | ICD-10-CM | POA: Diagnosis not present

## 2022-08-01 DIAGNOSIS — I480 Paroxysmal atrial fibrillation: Secondary | ICD-10-CM | POA: Diagnosis not present

## 2022-08-03 ENCOUNTER — Other Ambulatory Visit: Payer: Self-pay | Admitting: Internal Medicine

## 2022-08-06 ENCOUNTER — Other Ambulatory Visit: Payer: Self-pay | Admitting: Internal Medicine

## 2022-08-09 ENCOUNTER — Other Ambulatory Visit: Payer: Self-pay | Admitting: Internal Medicine

## 2022-08-09 MED ORDER — PREGABALIN 75 MG PO CAPS: 75.0000 mg | ORAL_CAPSULE | Freq: Three times a day (TID) | ORAL | 5 refills | Status: DC

## 2022-08-09 NOTE — Telephone Encounter (Signed)
Already sent in

## 2022-08-10 DIAGNOSIS — I509 Heart failure, unspecified: Secondary | ICD-10-CM | POA: Diagnosis not present

## 2022-08-10 DIAGNOSIS — E876 Hypokalemia: Secondary | ICD-10-CM | POA: Diagnosis not present

## 2022-08-10 DIAGNOSIS — I2510 Atherosclerotic heart disease of native coronary artery without angina pectoris: Secondary | ICD-10-CM | POA: Diagnosis not present

## 2022-08-10 DIAGNOSIS — E860 Dehydration: Secondary | ICD-10-CM | POA: Diagnosis not present

## 2022-08-10 DIAGNOSIS — R197 Diarrhea, unspecified: Secondary | ICD-10-CM | POA: Diagnosis not present

## 2022-08-10 DIAGNOSIS — I480 Paroxysmal atrial fibrillation: Secondary | ICD-10-CM | POA: Diagnosis not present

## 2022-08-10 DIAGNOSIS — E1151 Type 2 diabetes mellitus with diabetic peripheral angiopathy without gangrene: Secondary | ICD-10-CM | POA: Diagnosis not present

## 2022-08-10 DIAGNOSIS — E1122 Type 2 diabetes mellitus with diabetic chronic kidney disease: Secondary | ICD-10-CM | POA: Diagnosis not present

## 2022-08-17 DIAGNOSIS — E1151 Type 2 diabetes mellitus with diabetic peripheral angiopathy without gangrene: Secondary | ICD-10-CM | POA: Diagnosis not present

## 2022-08-17 DIAGNOSIS — I2510 Atherosclerotic heart disease of native coronary artery without angina pectoris: Secondary | ICD-10-CM | POA: Diagnosis not present

## 2022-08-17 DIAGNOSIS — E876 Hypokalemia: Secondary | ICD-10-CM | POA: Diagnosis not present

## 2022-08-17 DIAGNOSIS — E860 Dehydration: Secondary | ICD-10-CM | POA: Diagnosis not present

## 2022-08-17 DIAGNOSIS — I509 Heart failure, unspecified: Secondary | ICD-10-CM | POA: Diagnosis not present

## 2022-08-17 DIAGNOSIS — I480 Paroxysmal atrial fibrillation: Secondary | ICD-10-CM | POA: Diagnosis not present

## 2022-08-17 DIAGNOSIS — R197 Diarrhea, unspecified: Secondary | ICD-10-CM | POA: Diagnosis not present

## 2022-08-17 DIAGNOSIS — E1122 Type 2 diabetes mellitus with diabetic chronic kidney disease: Secondary | ICD-10-CM | POA: Diagnosis not present

## 2022-08-24 ENCOUNTER — Other Ambulatory Visit: Payer: Self-pay | Admitting: Internal Medicine

## 2022-09-16 ENCOUNTER — Other Ambulatory Visit: Payer: Self-pay | Admitting: Cardiology

## 2022-09-16 DIAGNOSIS — I480 Paroxysmal atrial fibrillation: Secondary | ICD-10-CM

## 2022-09-18 ENCOUNTER — Other Ambulatory Visit: Payer: Self-pay

## 2022-09-18 DIAGNOSIS — I480 Paroxysmal atrial fibrillation: Secondary | ICD-10-CM

## 2022-09-18 MED ORDER — RIVAROXABAN 15 MG PO TABS: 15.0000 mg | ORAL_TABLET | Freq: Every day | ORAL | 3 refills | Status: DC

## 2022-09-18 NOTE — Telephone Encounter (Signed)
Prescription refill request for Xarelto received.  Indication:afib Last office visit:11/23 Weight:54.7  kg Age:77 Scr:0.88  2/24 CrCl:46.96 Prescription refilled for Xarelto 15 mg Daily

## 2022-09-20 NOTE — Telephone Encounter (Signed)
Per dosing criteria Xarelto 15 mg appropriate.  Refill sent in 5/28

## 2022-10-08 ENCOUNTER — Other Ambulatory Visit: Payer: Self-pay | Admitting: Internal Medicine

## 2022-10-15 ENCOUNTER — Ambulatory Visit: Payer: Medicare HMO | Admitting: Internal Medicine

## 2022-10-15 ENCOUNTER — Encounter: Payer: Self-pay | Admitting: Internal Medicine

## 2022-10-15 VITALS — BP 122/90 | HR 77 | Temp 98.0°F | Resp 16 | Ht 62.0 in | Wt 122.0 lb

## 2022-10-15 DIAGNOSIS — K200 Eosinophilic esophagitis: Secondary | ICD-10-CM

## 2022-10-15 DIAGNOSIS — J449 Chronic obstructive pulmonary disease, unspecified: Secondary | ICD-10-CM

## 2022-10-15 DIAGNOSIS — Z789 Other specified health status: Secondary | ICD-10-CM

## 2022-10-15 DIAGNOSIS — G4730 Sleep apnea, unspecified: Secondary | ICD-10-CM

## 2022-10-15 DIAGNOSIS — N1832 Chronic kidney disease, stage 3b: Secondary | ICD-10-CM

## 2022-10-15 DIAGNOSIS — E039 Hypothyroidism, unspecified: Secondary | ICD-10-CM | POA: Diagnosis not present

## 2022-10-15 DIAGNOSIS — I25810 Atherosclerosis of coronary artery bypass graft(s) without angina pectoris: Secondary | ICD-10-CM

## 2022-10-15 DIAGNOSIS — Z86711 Personal history of pulmonary embolism: Secondary | ICD-10-CM | POA: Insufficient documentation

## 2022-10-15 DIAGNOSIS — Z0000 Encounter for general adult medical examination without abnormal findings: Secondary | ICD-10-CM | POA: Diagnosis not present

## 2022-10-15 DIAGNOSIS — J310 Chronic rhinitis: Secondary | ICD-10-CM

## 2022-10-15 DIAGNOSIS — E1121 Type 2 diabetes mellitus with diabetic nephropathy: Secondary | ICD-10-CM | POA: Diagnosis not present

## 2022-10-15 DIAGNOSIS — I872 Venous insufficiency (chronic) (peripheral): Secondary | ICD-10-CM | POA: Insufficient documentation

## 2022-10-15 DIAGNOSIS — Z87442 Personal history of urinary calculi: Secondary | ICD-10-CM

## 2022-10-15 DIAGNOSIS — I679 Cerebrovascular disease, unspecified: Secondary | ICD-10-CM | POA: Diagnosis not present

## 2022-10-15 DIAGNOSIS — I252 Old myocardial infarction: Secondary | ICD-10-CM

## 2022-10-15 DIAGNOSIS — I5032 Chronic diastolic (congestive) heart failure: Secondary | ICD-10-CM | POA: Insufficient documentation

## 2022-10-15 DIAGNOSIS — E1159 Type 2 diabetes mellitus with other circulatory complications: Secondary | ICD-10-CM | POA: Insufficient documentation

## 2022-10-15 DIAGNOSIS — E7800 Pure hypercholesterolemia, unspecified: Secondary | ICD-10-CM | POA: Diagnosis not present

## 2022-10-15 DIAGNOSIS — R519 Headache, unspecified: Secondary | ICD-10-CM

## 2022-10-15 DIAGNOSIS — Z8719 Personal history of other diseases of the digestive system: Secondary | ICD-10-CM

## 2022-10-15 DIAGNOSIS — G44029 Chronic cluster headache, not intractable: Secondary | ICD-10-CM

## 2022-10-15 DIAGNOSIS — Z6822 Body mass index (BMI) 22.0-22.9, adult: Secondary | ICD-10-CM

## 2022-10-15 DIAGNOSIS — R413 Other amnesia: Secondary | ICD-10-CM

## 2022-10-15 DIAGNOSIS — M159 Polyosteoarthritis, unspecified: Secondary | ICD-10-CM

## 2022-10-15 DIAGNOSIS — I480 Paroxysmal atrial fibrillation: Secondary | ICD-10-CM

## 2022-10-15 DIAGNOSIS — K2100 Gastro-esophageal reflux disease with esophagitis, without bleeding: Secondary | ICD-10-CM

## 2022-10-15 DIAGNOSIS — I10 Essential (primary) hypertension: Secondary | ICD-10-CM

## 2022-10-15 DIAGNOSIS — J9611 Chronic respiratory failure with hypoxia: Secondary | ICD-10-CM

## 2022-10-15 DIAGNOSIS — Z8673 Personal history of transient ischemic attack (TIA), and cerebral infarction without residual deficits: Secondary | ICD-10-CM

## 2022-10-15 DIAGNOSIS — J8410 Pulmonary fibrosis, unspecified: Secondary | ICD-10-CM

## 2022-10-15 MED ORDER — TRIAMCINOLONE ACETONIDE 0.1 % EX CREA: 1.0000 | TOPICAL_CREAM | Freq: Two times a day (BID) | CUTANEOUS | 0 refills | Status: AC

## 2022-10-15 MED ORDER — RSV PRE-FUSION F A&B VAC RCMB 120 MCG/0.5ML IM SOLR: 0.5000 mL | Freq: Once | INTRAMUSCULAR | 0 refills | Status: AC

## 2022-10-15 MED ORDER — IPRATROPIUM BROMIDE 0.03 % NA SOLN: 2.0000 | Freq: Two times a day (BID) | NASAL | 12 refills | Status: AC

## 2022-10-15 NOTE — Assessment & Plan Note (Signed)
She denies any problems with GERD today and she is on her PPI.

## 2022-10-15 NOTE — Assessment & Plan Note (Signed)
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  She currently has no angina and denies any DOE or chest pain on exertion. She did see DR. Kirtland Bouchard in 02/2022 and he felt she was stable.  I am going to refer her back to Dr. Kirtland Bouchard in cardiology as I am not sure how often he wants to see her.  Continue on risk factor modification and ASA.  She is not on a statin due to statin intolerance (see below).

## 2022-10-15 NOTE — Assessment & Plan Note (Signed)
She seems euthyroid today.  We will check her TFT's. 

## 2022-10-15 NOTE — Assessment & Plan Note (Signed)
She has post stroke headaches where she has seen myself as well as neurology.  There has been no change to them and we will continue her current meds.  The tylenol #3 with caffiene did not help.

## 2022-10-15 NOTE — Assessment & Plan Note (Signed)
See plan above.

## 2022-10-15 NOTE — Assessment & Plan Note (Signed)
She has had chronic diastolic CHF without evidence of decompensation/volume overload.  She had this problem a few years ago while she was in the hospital. She is not on diuretics and this is currently not a problem.

## 2022-10-15 NOTE — Assessment & Plan Note (Signed)
She is not on HTN meds.  Her BP is currently doing well.

## 2022-10-15 NOTE — Assessment & Plan Note (Signed)
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  She can take tylenol as needed.  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

## 2022-10-15 NOTE — Assessment & Plan Note (Signed)
She has chronic venous insufficiency but has not had any problems in the last few years.  She has a rash on her bilateral feet and ankles which I do not think is venous dermatitis.  I am going to ask her to use triamcinolone and eucerin mixed BID for 2-3 weeks.

## 2022-10-15 NOTE — Progress Notes (Signed)
Preventive Screening-Counseling & Management   Aviyana Sonntag. Mink is a 77 year old Caucasian/White female who presents for her annual wellness exam. She is due for the following health maintenance studies: mammogram, visual exam, colonoscopy, and screening labs. This patient's past medical history Aortic Atherosclerosis, Atrial Fibrillation, B12 deficiency, CAD, Cerebrovascular Accident (CVA), Chronic kidney disease, Stage III (moderate), Chronic Venous Insuffiency, COPD, Depression, Diabetes Mellitus, Type II, Diastolic heart failure; chronic, Eosinophliic Esophagitis, Hyperlipidemia, Irritable Bowel Syndrome, Obstructive Sleep Apnea, Pulmonary Embolism, and Respiratory Failure, Chronic.   Her last eye exam was done in 06/2022 and she states her vision is doing well. Her last colonoscopy was in 08/2014 and this showed internal hemorrhoids but was otherwise normal. They want to repeat her colonoscopy in 5 years which we referred her in 2021. The GI office called them but she did not go as she was "not feeling up to it".  She had a previous colonoscopy in 04/2011 and this showed polyps. I did refer her last year for colonoscopy where she saw Dr. Jennye Boroughs on 09/05/2020 and he wanted to consider a future colonoscopy if her health improved.  The patient is telling me that he does not want to repeat a colonoscopy unless she has a problems.  She had a screening digital mammogram done in 07/22/2019 and this was normal. We referred her for a mammogram last year but they tell me she did not go.  She does not want anymore mammograms.  She has had a total hystrectomy in the past and no longer needs pap smears. Her last DEXA scan was done in 06/2010 and this was normal. The patient does not exercise regularly. The patient does get yearly flu vaccines. She had a pneumovax 23 and zostavax shingles vaccine done at age of 63 at [facility]. She had a prevnar 13 vaccine in 2016. She has had 2 COVID-19 vaccines but no boosters. She  is on Xarelto 15mg  daily and ASA 81mg  daily.   Dorothyann Peng. Maxton returns today for routine followup on her cholesterol.  On her last visit, her cholesterol was elevated but we did not put her on any meds due to her complications of her medications.  This past year, her cholesterol was elevated and I increased her pravastatin from 40mg  to 80mg  daily.  Unfortunately, she had myalgias from this and discontinued her pravastatin.  She just saw Dr. Kirtland Bouchard in cardiology on 03/09/2022 and he wanted her to started zetia.  She did not do this as she has been on zetia before and this has caused severe diarrhea.  I wanted her to start nexletol for her high cholesterol as she has an intolerance to statins.  She never did start the medicine as she was afraid of catching pancreatitis again.  Again, she stopped praulent over a year ago as it may have caused her pancreatitis.  Overall, she states she is doing well and is without any complaints or problems at this time. She has a history of statin intolerance where she has had myalgias from zocor, lipitor, pravastatin, and crestor. She was also tried on welchol and she did not like taking this. She specifically denies abdominal pain, nausea, vomiting, diarrhea, myalgias, and fatigue. She remains on dietary management at this time.  She was on fenofibrate but they stopped this as it could have caused her pancreatitis.  After her stroke in 04/2021, I started her on pravastatin 40mg  qhs but again she stopped this due to myalgias..  She tries to control her cholesterol  with diet and she has now quit fish oil.  She is fasting in anticipation for labs today.   The patient is a 77 year old Caucasian/White female who returns for a follow-up visit for her diet controlled diabetes. Since the last visit, there have been no problems. She remains on no medications; the diabetes is being managed by dietary intervention alone. She is not walking as much as they would like.. She specifically denies  unexplained abdominal pain, nausea or vomiting and documented hypoglycemia. She has not been checking her blood sugars at home as they do not have a glucometer. She came in fasting today in anticipation of lab work. Her last HgBA1c was done in 03/2022 and was 5.9%.  There are no complications of diabetic retinopathy or neuropathy.  She has CKD probably secondary to her HTN and diabetic nephropathy.  She has a history of cardiovascular diease as well and history of stroke but her diabetes has always been controlled.    The patient has a history of Stage IIIb CKD.  She was seeing the nephrologist once every 3-4 months with her last visit was in 2019 and she states that she is not going back.  Her creatinine at that time was 1.5.  Her GFR is around from 30-50 however she had recent labs this past year that showed her GFR was greater than 60.   We have noted that she went from Stage IIIa to Stage IIIb CKD over the past 1-2 years.  We have not started her on farixga as of yet.   Nyliah is a 77 yo female who also returns for her history of post stroke headaches.  I saw her this past year where her headaches had worsened.  At that time I felt she had a vascular component to her headaches.  She was taking oxycodone given to her by [facility] for her headaches and I asked her to stop this.  I did place her on low dose labetalol for headache prevention and increased her lyrica from 75mg  BID to 75mg  TID.  Today, she thinks her headaches are doing better and she is adding tyelnol 650mg  at times to help.  Over the interim, I had placed her on tylenol #3 with caffeine but the patient only took this a couple of times per her husband.  She states it did not work.  She remains on topamax 200mg  BID.  Again, she was admitted to [facility] in 12/2021 for intractable headaches.  She has a history of post stroke headaches where I have placed her on lyrica in the past which has helped this.  She developed a severe bitemporal headache  (like a band across her forehead) on the day of admission where she initially thought she was having a stroke and 911 was called.  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  Neurology was consulted and the patient underwent a MRI and CT scans of the brain which were negative.  They did a temporal artery [place] which was negative.  Neurology recommended a CT myelogram which showed a CSF contrast extending into the right L4-L5 foramen.  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  She continued to have have symptoms and she went for a repeat blood patch and her symptoms improved.  The doctors wanted her to followup with [facility] neurology as an outpatient.  She  has not followed up with Neurology.   Today, she states her headaches are still occurring daily and she is taking her lyrica 75mg  in AM and PM but she takes an extra dose in the afternoon.  She states tylenol does help.  There is no new weakness/numbness, change in her vision or other problems.  Neurology did a  CT scan of her head in 05/2015 and this showed an old stroke but nothing new. She did have an overnight EEG done on 12/30/2021 and this showed moderate diffuse encephalopathy with nonspecific etiology but no seizures were noted.  She had a MRI of her brain on 12/27/2021 which showed no acute intracranial abnormality but there were old unchanged signal abnormality in her right basal ganglia related to prior infarct.  There was encephalomalacia involving the right MCA territory including the right basal ganglia.  There was mild chronic microvascular disease.  Over the interim, he states he talked to [facility] but never went for a visit.  She also had some memory problems from her stroke in the past. She was started on aricept for her memory and remains on this.  Last year in 2023, her MMSE was 29.  There was some question from neurology in the past  about possible seizure from falls 3 years ago where they placed her on Topamax which we will continue  Also this past year, Mrs. Blecha was admitted to [facility] from 11/01/2021 until 11/04/2021 for acute metabolic encephalopathy with acute on chronic hypoxic respiratory failure due to pneumonia.  Her husband states she became more somnolent where 911 was activated.  On arrival they found her to have an oxygen sat of 60% on RA.  They started her on oxygen via ANC and brought her to [facility] ER.  During her workup, she was found to have tachypnea, tachycardia and an elevated WBC of 22K where she met criteria for sepsis.  She was also noted to have swelling in her left great toe.  Xrays of her toe and chest however showed no acute abnormality.  She was also noted to have acute on chronic renal failure at that time.  Due to her having sepsis of unknown cause at that time, they felt she could have had a UTI and they did a CT scan of her abdomen and pelvis.  This showed persistent left nephrolithiasis without obstruction with prior left stent removal.  However, they noted she had bilateral pneumonia seen on the abdomen/pelvis scan and they also noted fecal impaction.  She was admitted to the hospital and resumed on oxygen and started on IVF's and antibiotics.  Her acute metabolic encephalopathy improved.  She had 2 normal BM's and they stopped her off laxatives.  Her acute renal failure improved.  She improved to RA and was able to ambulate and was sent home with 5 days of oral antibiotics.  They felt she had pneumonia superimposed on chronic bronchiectasis.  The hospitalists also felt she had an acute left great toe gout flare where she was given colchicine and her uric acid level was 7.7 at that time.  She is no longer on oxygen but has it available if she needs it.   Mrs. Broecker was hospitalized for urosepsis and history of kidney stones with stent placement in 04/2021.  She had outpatient surgery on 08/09/2021  she underwent a cystoscopy with left ureteroscopy with laser lithotripsy and a left ureteral stent exchange.  She did see Dr. Sande Brothers on 08/17/2021 and they noted at that  time that they had removed her stent at home without problems.  They noted blood in her urine at that time.  Again, she was hospitalized in 06/2021 where she presented with hematuria and fever.  They diagnosed her with a UTI and sent her home on antibiotics.  However, the next day she continued to have fever where her husband had her taken to [facility].  In the ER they felt she had a UTI with sepsis.  This was due to a probable obstructing known kidney stone where she had a stent placed during hospitalization in 04/2021.  She was placed on broad spectrum antibiotics.  I reviewed her urine culture and this showed low CFU of E. coli.  She had some acute delirium per her husband that occurred after they gave her morphine at the hospitalization.  The patient was sent home on antibiotics and followed up with urology as described above.    The patient therefore was referred to Dr. Tally Joe in ortho where she saw him on on 08/18/2021 where he did xrays of her shoulder showing arthritis.  He gave her a Kenalog injection in her left shoulder.  This did help her and she was able to go back to therapy and she has finished the homehealth therapy.  She states she still has pain in her left shoulder at times.      Mrs. Morton was hospitalized for a new stroke as well as UTI/Sepsis in 04/2021.  She presented to [facility] on 05/01/2021 with new onset left side weakness.  A CT scan of her head done there showed a new acute M1 and M2 occlusion.  She does have a history of PVD.  She was also noted to have a UTI with sepsis.  The patient was transferred to [facility] where she was seen by neurology and a repeat CT scan of her head demonstrated a new right MCA infarct.  She was placed on IVF's antibiotics and did require pressors for her infection.  She did go into  acute respiratory failure with possible aspiration pneumonia requiring BIPAP where she was subsequently weaned down to 2L of O2 via [place].  Urology saw her and she was discovered to have a 7mm left ureteral stone where she underwent cystoscopy and ureteral stent placement.   An ECHO was performed which showed an EF of 60-65%.  Neurology changed her Xarelto for A. fib from 20mg  daily to 15mg  daily and also wanted to start her on ASA 81mg  daily.  The patient was noted to have continued left side weakness where she was sent to inpatient rehab where she stayed from 1/14 until 1/24.  She completed a course of antibiotics and continued with therapies at that time. Since then, she has had new left side weakness from this stroke that effects both her left arm and left leg.  Since her last visit with me, she states the weakness in her left arm has improved with therapies.  She saw neurology as an outpatient on 06/07/2021 and recommended secondary prevention as we have been doing for her history of stroke.  Today, both her and her husband states that there is no worsening memory problems, dysphagia, problems with speech or new problems with her vision.   There has been no change to her left sided weakness.  She does use a rollator and a cane at home.   The patient is a 77 year old Caucasian/White female who returns today for followup of her hypertension.  The patient  has  been checking her BP at home.   They states her systolic BP runs [id number].  The patient's current medications include:  none.  She used to be on midodrine 10 mg oral tablet TID.  She has had problems with orthostatic hypotension in the past but has not had a problems with this for years.  The patient denies any dizziness, lightheadness, chest pain, and shortness of breath.    Last year, she had Vitamin D poisoning where she was placed by one of her doctor's on Vitamin D where she was on Vit D 50,000 units twice a week.  I did followup lab testing and her Vit  D level had come down.    The patient unfortunately contracted COVID-19 where she was hospitalized at [facility] in 07/2019. She had progressive weakness, confusion and cough where she was brought into the ER and they found she was hypoxic with sats at 85%.  She was placed on oxygen and a CXR was obtained which demonstrated a viral pneumonia.  She was treated with a 5 day course of remedesvir and 10 day course of decadron.  They did not CT scan her lungs.  I am told by her husband she went up to 15 L on oxygen while she was in the hospital.  They also felt she had a secondary bacterial pneumonia as well as a Klebsiella UTI where she was treated.  She also had COVID-19 induced diarrhea that developed into ARF from her diarrhea/dehydration.  This improved with IVF's.  She did improve but they wanted to discharge her for short term rehab but her husband took her home.  She did receive homehealth PT and recovered and was sent home on 1L of oxygen via [place] which she only used for about 2 weeks as her oxygen levels improved.   She was rehospitalized from 03/22/2020 through 03/26/2020 due to community acquired pneumonia.  They performed a CTA of her chest on 03/22/2020 and this showed apical pulmonary fibrosis with superimposed acute airspace disease in the right upper lobe.  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    She did recover but I did send her to [facility] in 03/2020.   She has fibrotic changes after her COVID-19 pneumonia as well as chronic cough with sputum production and chronic rhinitis.  They have started her on ipratropium nebs and flutter valve as well as Flonase nasal spray and ipratropium nasal spray for her rhinitis.  She did followup with pulmonary on 06/14/2020 where they ordered and performed another high resolution CT scan of her chest which showed chronic scarring/fibrosis in her Bilateral upper lobes and moderate centrilobular emphysema.  She last saw pulmonary in  12/13/2020 and they recommend her continued inhaled meds.  She also saw Dr. Lucie Leather in allergy/immunology on 02/2021 and he recommended continued inhaled meds as well as meds for her allergies and her GERD.  She is on omeprazole for her GERD.   She is not having GERD at this time but continues with a chronic cough that is non-productive.  Today, there is no change in her chronic cough nor her breathing.  She is not on oxygen at this time.  The patient also presents with  COPD of years duration and presents today for a status visit.  She did have a pneumonia with COPD that was treated in 11/2018.  She was again diagnosed with pneumonia in 02/2018.  She has a chronic cough as described above.  She saw Dr.  Chaudri in August 2014 where he was following her for chronic respiratory failure. She also has a history of OSA and was on nightly CPAP but she stopped this over 6 years ago.  See PMH for summary of pulmonary history and lab reports for status of any previous PFTs: COPD, Obstructive Sleep Apnea, and Respiratory Failure, Chronic. Comorbid conditions : atrial fibrillation. She has the following baseline symptoms: exertional dyspnea. The exertional dyspnea used to be brought on by walking about 300-400 feet but after COVID-19, she could only walk about 100 feet before she gets SOB.  However, since her stroke in 04/2021, she states she has no SOB at rest but can walk about 10 feet before getting DOE but this has now improved to about 100 feet.   has the following modifiable risk factors: sedentary lifestyle. Specifically denied complaints: worsening exertional dyspnea, increased wheezing, change in color of sputum, pleuritic chest pain, hemoptysis, and fever. Interval history: no healthcare visits with other providers since last seen in this office. Interval studies: none.  She has an albuterol HFA but has not had to use it for months.  Her husband states that when she was in the hospital in 07/2021 they stopped her off a  number of her inhalers. She states she feels better off these meds. Her husband states she has a history of sleep apnea where she had a sleep study.  She was prescribed a CPAP but the patient states she could not tolerate this.  Fanny also has a history of anemia where she had a 3 point drop in her HgB in 10/2019 where her HgB in 06/2019 was 13.7 and in 10/2019 it was 10.7.  They contacted Dr. Jennye Boroughs and he felt she was high risk for endoscopy and the patient wanted to just follow this.  Dr. Jennye Boroughs started her on iron but she states she had to stop this due to diarrhea.  We saw her in 05/2018 for a yearly exam and her lab tests showed she had some anemia.  We did hemoccult testing and this was positive where we sent her to see GI.  They wish to proceed with colonoscopy at some point.  She denies any BRBPR or melena.  Makaelyn has a history of dysphagia where she has seen Dr. Braulio Conte and Dr. Charm Barges in the past.  She saw Dr. Charm Barges in 02/2013 for her dysphagia.  She has had a workup for this in the past where a barium swallow showed esophageal dysmotlity.  An EGD done in 04/2011 showed erosive esophagitis and duodenitis and a small hiatal hernia.  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  She does not take this.  She has had esophageal dilatation done in 2013 and had another dilatation done in 08/2014.  She did have a repeat EGD in 04/2018 which showed mild gastritis and duodenitis with a smal hiatal hernia.  Her esophagus was dilated at that time.  She denies any problems with swallowing at this time.  The patient is a 77 year old Caucasian/White female who returns for a regularly scheduled thyroid check.  She remains on levothyoxine daily.   She claims to have no symptoms suggestive of thyroid imbalance specifically denying  cold intolerance, heat intolerance, tremors, anxiety, unexplained weight changes, diarrhea, constipation, dry skin or  insomnia.       The patient returns for followup of her depression.   In 05/2018, she scored higher on her PHQ9  but did not want to go up on her medications.  I saw her in 2022 and she stated at that time her depression was resolved.  Again, she weaned off her cymbalta in 2019 and she has not had any worsening of her depression.  She reports no additional symptoms.  She no longer uses amitriptyline for sleep. She denies difficulty concentrating, difficulty performing routine daily activities, fatigue, extreme feelings of guilt, feelings of isolation, weight loss, insomnia, loss of appetite, social withdrawal, loss of interest in pleasurable activities, out of control feelings, and panic attacks. This patient feels that she is able to care for herself. She currently lives with her husband. She has no significant prior history of mental health disorders.    Mrs. Guilliams also has history of pancreatitis where she was admitted to the hospital in 03/2018 with pancreatitis and in 04/2018 with SOB and dehydration.  Again, she had a hospitalization in 02/2018 for pneumonia.  She saw Dr. Jennye Boroughs who did an EGD on 04/28/2018 which showed gastritis and duodenitis and placed her on twice daily PPI.  She did see Dr. Jennye Boroughs in 2021 who started her on bentyl for her IBS which she states helps a lot at that time.  She has not used it since her visit in 2021.   Apparently in 03/2018, she had acute on chronic diastolic CHF where they did diuresis.  Today, she denies any swelling in her bilateral lower feet.  We started her on some prn Lasix which has not had to use in over 4-5 years. She also has a history of chronic venous insuffiency but again she has not had any recent swelling.  She has not had to use compression hose in over 4-5 years.   This patient also has moderately severe CAD where she was diagnosed with a heart attack in 06/2000 and presents today for a status visit. See PMH for summary of cardiac history and  status of revascularization. She underwent cardiac catherization in 2002 and had stent placement. The patient undewent Lexiscan nuclear stress test on 09/2012 and this showed no evidence of ischemia and a normal systolic function.  However in 11/2013, she was complaining of some chest discomfort to me.  I sent her to see Cardiology where ultimately she was admitted to [facility] in 11/2013 and she had a heart catherization and eventual 3 vessel CABG performed.  She has been following with cardiology regularly and has not had any CP, or other problems.   Her CAD is controlled with therapy as summarized in the medication list and previous notes. The patient has the following comorbid condition(s): atrial fibrillation. She has no baseline symptoms of CAD. She has the following modifiable risk factor(s): DM, hyperlipidemia, and sedentary lifestyle. Specifically denied complaint(s): chest pain, palpitations, edema, exertional dyspnea which she attributes to her COPD, and syncope.  She last saw Cardiology 02/2022 with Dr. Kirtland Bouchard for her history of CAD and A. Fib.  He felt she was stable and wanted her to continue on her current therapies.  This patient also has mild atrial fibrillation of about years known duration and presents today for a status visit. Her atrial fibrillation is controlled with therapy as summarized in the medication list and previous notes.  The patient was on amiodarone but this was stopped when she was hospitalized this past year.  She remains anticoagulated with Xarelto.Marland Kitchen  Specifically denied complaints: chest pain, palpitations, TIAs, edema, exertional dyspnea, and syncope. Interval history: no healthcare visits with other providers since last  seen in this office. Interval studies: none. Anticoagulation status: Xarelto 15mg  daily.   Her CHAD-VASC2 score is a 6. There is no bleeding, bruising, BRBPR or melena.  She also has a history of pulmonary embolism which was diagnosed in 07/2000 after her cardiac  catherization.  She continued to have pain after this procedure and was found to have pulmonary embolism.  The patient has never had a DVT and was told she needs life long anticoagulation.  She remains on xarelto daily.  There is no bleeding, bruising, BRBPR, melena or other problems.      Are there smokers in your home (other than you)? No  Risk Factors Current exercise habits:  as above   Dietary issues discussed: none   Depression Screen (Note: if answer to either of the following is "Yes", a more complete depression screening is indicated)   Over the past two weeks, have you felt down, depressed or hopeless? No  Over the past two weeks, have you felt little interest or pleasure in doing things? No  Have you lost interest or pleasure in daily life? No  Do you often feel hopeless? No  Do you cry easily over simple problems? No  Activities of Daily Living In your present state of health, do you have any difficulty performing the following activities?:  Driving? Yes Managing money?  Yes Feeding yourself? No Getting from bed to chair? No Climbing a flight of stairs? Yes Preparing food and eating?: No Bathing or showering? No Getting dressed: No Getting to the toilet? No Using the toilet:No Moving around from place to place: No In the past year have you fallen or had a near fall?:No   Are you sexually active?  No  Do you have more than one partner?  No  Hearing Difficulties: Yes Do you often ask people to speak up or repeat themselves? Yes Do you experience ringing or noises in your ears? No Do you have difficulty understanding soft or whispered voices? Yes   Do you feel that you have a problem with memory? Yes  Do you often misplace items? Yes  Do you feel safe at home?  Yes  Cognitive Testing  Alert? Yes  Normal Appearance?Yes  Oriented to person? Yes  Place? Yes   Time? Yes  Recall of three objects?  Yes  Can perform simple calculations? Yes  Displays appropriate  judgment?Yes  Can read the correct time from a watch face?Yes  Fall Risk Prevention  Any stairs in or around the home? No  If so, are there any without handrails? No  Home free of loose throw rugs in walkways, pet beds, electrical cords, etc? Yes  Adequate lighting in your home to reduce risk of falls? Yes  Use of a cane, walker or w/c? Yes    Time Up and Go  Was the test performed? Yes .  Length of time to ambulate 10 feet: 14 sec.   Gait slow and steady without use of assistive device    Advanced Directives have been discussed with the patient? Yes   List the Names of Other Physician/Practitioners you currently use: Patient Care Team: Crist Fat, MD as PCP - General (Internal Medicine)    Past Medical History:  Diagnosis Date   Acquired asymmetrical kidneys 06/21/2015   Formatting of this note might be different from the original. 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   Allergy 06/09/2021   Anemia  Anxiety    Arthritis    Asthma    Atrial fibrillation (HCC)    Blood transfusion without reported diagnosis    Cataract    CHF (congestive heart failure) (HCC)    Chronic kidney disease    Clotting disorder (HCC)    COPD (chronic obstructive pulmonary disease) (HCC)    Dementia (HCC)    Depression    Diabetes mellitus without complication (HCC)    Dyspnea    GERD (gastroesophageal reflux disease)    Great toe pain, left 11/01/2021   Headache    Heart murmur    History of CVA with residual deficit 11/01/2021   History of kidney stones    Hyperlipidemia    Hypothyroidism    Myocardial infarction (HCC)    Neuromuscular disorder (HCC)    tremors   Pneumonia due to COVID-19 virus    Postoperative wound infection 03/10/2014   Seizures (HCC)    Sleep apnea    Stroke (HCC)    Tension-type headache, not intractable 01/10/2016   Thyroid disease    Wide-complex tachycardia     Past Surgical History:  Procedure  Laterality Date   ABDOMINAL HYSTERECTOMY     APPENDECTOMY     bladder tack     CHOLECYSTECTOMY     CORONARY ARTERY BYPASS GRAFT     CYSTOSCOPY W/ URETERAL STENT PLACEMENT Left 05/01/2021   Procedure: CYSTOSCOPY WITH RETROGRADE PYELOGRAM/URETERAL STENT PLACEMENT;  Surgeon: Rene Paci, MD;  Location: [facility] OR;  Service: Urology;  Laterality: Left;   CYSTOSCOPY/URETEROSCOPY/HOLMIUM LASER/STENT PLACEMENT Left 08/09/2021   Procedure: CYSTOSCOPY/URETEROSCOPY/HOLMIUM LASER/STENT EXCHANGE;  Surgeon: Rene Paci, MD;  Location: [facility] ORS;  Service: Urology;  Laterality: Left;   ESOPHAGOGASTRODUODENOSCOPY ENDOSCOPY     EYE SURGERY     FRACTURE SURGERY Right    3rd finger   IR FLUORO GUIDED NEEDLE PLC ASPIRATION/INJECTION LOC  12/27/2021   IR FLUORO GUIDED NEEDLE PLC ASPIRATION/INJECTION LOC  01/01/2022   JOINT REPLACEMENT Bilateral    knees   TONSILLECTOMY AND ADENOIDECTOMY     TUBAL LIGATION        Current Medications  Current Outpatient Medications  Medication Sig Dispense Refill   albuterol (VENTOLIN HFA) 108 (90 Base) MCG/ACT inhaler Inhale into the lungs.     aspirin 81 MG EC tablet Take 1 tablet (81 mg total) by mouth daily. Swallow whole. 30 tablet 0   donepezil (ARICEPT) 10 MG tablet TAKE 1 TABLET BY MOUTH EVERY DAY IN THE EVENING 90 tablet 1   guaiFENesin (MUCINEX) 600 MG 12 hr tablet Take 600 mg by mouth daily as needed for cough or to loosen phlegm.     levothyroxine (SYNTHROID) 88 MCG tablet Take 1 tablet by mouth once daily 90 tablet 0   nitroGLYCERIN (NITROSTAT) 0.4 MG SL tablet Place 0.4 mg under the tongue every 5 (five) minutes as needed.     Nystatin (GERHARDT'S BUTT CREAM) CREA Apply 1 Application topically 3 (three) times daily. 1 each 0   omeprazole (PRILOSEC) 40 MG capsule TAKE 1 CAPSULE BY MOUTH TWICE A DAY BEFORE A MEAL 180 capsule 1   pregabalin (LYRICA) 75 MG capsule Take 1 capsule (75 mg total) by mouth 3 (three) times daily. 90 capsule 5    Probiotic Product (PROBIOTIC PO) Take 15 Billion Cells by mouth daily.     Propylene Glycol (SYSTANE COMPLETE OP) Apply 1 drop to eye as needed (Burning/Irritation).     Rivaroxaban (XARELTO) 15 MG TABS tablet Take 1 tablet (15 mg  total) by mouth daily. 90 tablet 3   topiramate (TOPAMAX) 200 MG tablet Take 1 tablet (200 mg total) by mouth 2 (two) times daily. 180 tablet 0   No current facility-administered medications for this visit.    Allergies Ciprofloxacin, Prochlorperazine edisylate, Morphine, Prednisone, Reduced iso-alpha acids complex, Statins, Sulfa antibiotics, Azithromycin, and Latex   Social History Social History   Tobacco Use   Smoking status: Former    Packs/day: 0.50    Years: 30.00    Additional pack years: 0.00    Total pack years: 15.00    Types: Cigarettes    Quit date: 06/26/2000    Years since quitting: 22.3   Smokeless tobacco: Never  Substance Use Topics   Alcohol use: Not Currently     Review of Systems Review of Systems  Constitutional:  Negative for chills, fever, malaise/fatigue and weight loss.  HENT:  Positive for hearing loss. Negative for tinnitus.   Eyes:  Negative for blurred vision and double vision.  Respiratory:  Positive for cough and wheezing. Negative for hemoptysis and shortness of breath.   Cardiovascular:  Negative for chest pain, palpitations and leg swelling.  Gastrointestinal:  Negative for abdominal pain, blood in stool, constipation, diarrhea, heartburn, melena, nausea and vomiting.  Genitourinary:  Negative for frequency and hematuria.  Musculoskeletal:  Positive for joint pain. Negative for back pain and myalgias.  Skin:  Negative for itching and rash.  Neurological:  Negative for dizziness, weakness and headaches.  Endo/Heme/Allergies:  Negative for polydipsia.  Psychiatric/Behavioral:  Negative for depression. The patient is not nervous/anxious and does not have insomnia.      Physical Exam:      Body mass index is 22.31  kg/m. BP (!) 122/90   Pulse 77   Temp 98 F (36.7 C)   Resp 16   Ht 5\' 2"  (1.575 m)   Wt 122 lb (55.3 kg)   SpO2 92%   BMI 22.31 kg/m   Physical Exam Constitutional:      Appearance: Normal appearance. She is not ill-appearing.  HENT:     Head: Normocephalic and atraumatic.     Right Ear: Tympanic membrane, ear canal and external ear normal.     Left Ear: Tympanic membrane, ear canal and external ear normal.     Nose: Nose normal. No congestion or rhinorrhea.     Mouth/Throat:     Mouth: Mucous membranes are moist.     Pharynx: Oropharynx is clear. No posterior oropharyngeal erythema.  Eyes:     General: No scleral icterus.    Conjunctiva/sclera: Conjunctivae normal.     Pupils: Pupils are equal, round, and reactive to light.  Neck:     Thyroid: No thyromegaly.     Vascular: No carotid bruit.  Cardiovascular:     Rate and Rhythm: Normal rate and regular rhythm.     Pulses: Normal pulses.     Heart sounds: Normal heart sounds. No murmur heard.    No friction rub. No gallop.  Pulmonary:     Effort: Pulmonary effort is normal. No respiratory distress.     Breath sounds: Wheezing present. No rhonchi or rales.  Abdominal:     General: Abdomen is flat. Bowel sounds are normal. There is no distension.     Palpations: Abdomen is soft.     Tenderness: There is no abdominal tenderness.  Musculoskeletal:     Cervical back: Normal range of motion. No tenderness.     Right lower leg: No edema.  Left lower leg: No edema.     Comments: No clubbing or cyanosis  Lymphadenopathy:     Cervical: No cervical adenopathy.  Skin:    General: Skin is warm and dry.     Findings: No rash.  Neurological:     General: No focal deficit present.     Mental Status: She is alert and oriented to person, place, and time.     Comments: CN II-XII grossly intact  Psychiatric:        Mood and Affect: Mood normal.        Behavior: Behavior normal.      Assessment:      Cerebrovascular  disease  Coronary artery disease involving coronary bypass graft of native heart without angina pectoris  PAF (paroxysmal atrial fibrillation) (HCC)  Hypercholesterolemia  Stage 3b chronic kidney disease (HCC)  Acute nonintractable headache, unspecified headache type  Diabetic nephropathy associated with type 2 diabetes mellitus (HCC)  Type 2 diabetes mellitus with other circulatory complication, without long-term current use of insulin (HCC)  Statin intolerance  History of pulmonary embolism  History of pancreatitis  Chronic respiratory failure with hypoxia (HCC)  Chronic obstructive pulmonary disease, unspecified COPD type (HCC)  History of kidney stones  Primary osteoarthritis involving multiple joints  Essential hypertension  Pulmonary fibrosis (HCC)  Chronic rhinitis  Eosinophilic esophagitis  Acquired hypothyroidism  Chronic diastolic CHF (congestive heart failure) (HCC)  Chronic venous insufficiency  History of myocardial infarction  Sleep apnea, unspecified type  Gastroesophageal reflux disease with esophagitis without hemorrhage  Type 2 diabetes mellitus with other circulatory complications (HCC)  Chronic cluster headache, not intractable  History of stroke  Memory deficit    Plan:     During the course of the visit the patient was educated and counseled about appropriate screening and preventive services including:   Pneumococcal vaccine  Influenza vaccine Screening mammography Colorectal cancer screening  Diet review for nutrition referral? Yes ____  Not Indicated _X___   Patient Instructions (the written plan) was given to the patient.  Cerebrovascular disease She has had multiple strokes and in the past year, there has been no change in her motor function on her left side.  She did complete therapies.  We will continue secondary prevention.  She remains on  xarelto 15mg  daily and ASA 81mg  daily.  She has seen neurology this past  year.  Chronic diastolic CHF (congestive heart failure) (HCC) She has had chronic diastolic CHF without evidence of decompensation/volume overload.  She had this problem a few years ago while she was in the hospital. She is not on diuretics and this is currently not a problem.  Chronic venous insufficiency She has chronic venous insufficiency but has not had any problems in the last few years.  She has a rash on her bilateral feet and ankles which I do not think is venous dermatitis.  I am going to ask her to use triamcinolone and eucerin mixed BID for 2-3 weeks.  Coronary artery disease involving coronary bypass graft of native heart without angina pectoris She has CAD with history of MI in the past where she had a CABG done in 11/2013.  She currently has no angina and denies any DOE or chest pain on exertion. She did see DR. Kirtland Bouchard in 02/2022 and he felt she was stable.  I am going to refer her back to Dr. Kirtland Bouchard in cardiology as I am not sure how often he wants to see her.  Continue on risk factor modification  and ASA.  She is not on a statin due to statin intolerance (see below).  Essential hypertension She is not on HTN meds.  Her BP is currently doing well.  PAF (paroxysmal atrial fibrillation) (HCC) Her A. Fib is currently controlled and I hear sinus rhythm on exam today.  They stopped her off amiodarone during one of her hospitailzations last year.  I am going to again refer back to Dr. Kirtland Bouchard.  She will remain on lifelong anticoagulation.  Chronic respiratory failure with hypoxia (HCC) She has chronic respiratory failure but does not need supplemental oxygen.    Chronic rhinitis She has chronic cough that allergy has been following. She was on Zyrtec and monteulkast but not Flonase or any inhalers. She also remains on omeprazole for her GERD. She was also on inhaled atrovent but she states none of these meds helped so she stopped them this past year.  She will followup with allergy as well.   COPD  (chronic obstructive pulmonary disease) (HCC) She has some mild wheezing today.  I asked her to start taking her albuterol as needed.  Pulmonary fibrosis (HCC) She unfortunately developed pulmonary fibrosis after COVID in 2021.  She has seen pulmonary for this.  She has actually improved with her walking baseline this past year.  Sleep apnea Her husband states she has a history of sleep apnea where she had a sleep study.  She was prescribed a CPAP but the patient states she could not tolerate this.  Eosinophilic esophagitis She remains on omeprazole for her eosinphilic esophagitis. She denies any problems with dysphagia today.  GERD (gastroesophageal reflux disease) She denies any problems with GERD today and she is on her PPI.  Type 2 diabetes mellitus with other circulatory complications (HCC) She has diabetes with CAD and history of stroke with PVD.  We will continue with risk factor modification.  She is not on any diabetic meds but controls this with diet.  Diabetic nephropathy associated with type 2 diabetes mellitus (HCC) She has diabetic nephropathy where we will check her urine studies today.  We will control her HgBA1c with goal <7%.  Hypothyroidism She seems euthyroid today. We will check her TFT's.  Cluster headache, not intractable She has post stroke headaches where she has seen myself as well as neurology.  There has been no change to them and we will continue her current meds.  The tylenol #3 with caffiene did not help.  Primary osteoarthritis involving multiple joints She has arthritis of her joints.  She can take tylenol as needed.  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  Stage 3b chronic kidney disease (HCC) She has Stage IIIb CKD due to her HTN and DM.  I want her to avoid NSAIDS at this time.  Her GFR this past year improved.  We will recheck this as well as her urine studies.  History of kidney stones She has a history of kidney  stones with ureteral stents in the past.  This is now resolved.  She has seen urology last year.  History of pancreatitis She has a history of pancreatitis probably due to her PSK-9 inhibitor.  We will not retry this class.  History of pulmonary embolism She has a history of DVT and PE and will remain on lifelong anticoagulation.  History of stroke She has had multiple strokes.  Plan as above.  Hypercholesterolemia She has a statin intolerance.  She cannot take PSK-9 inhibiotors.  Welchol caused myalgias.  She cannot take fenofibrate due to mylagias.  She had diarrhea from zetia.  The patient refused nexletol as she was afraid of pancreatitis.  We will check her FLP today.  Statin intolerance See plan above.  Memory deficit She had a memory deficit and was started on aricept.  Last year her MMSE was 29/30 and today in 2024, she scored a 27/30.  She does not have alzheimers dementia.  We will continue to follow.   Prevention Health maintenance discussed.  She does not want any more mammograms.  I am going to get her setup for a RSV vaccine.  We will obtain some yearly labs.  Medicare Attestation I have personally reviewed: The patient's medical and social history Their use of alcohol, tobacco or illicit drugs Their current medications and supplements The patient's functional ability including ADLs,fall risks, home safety risks, cognitive, and hearing and visual impairment Diet and physical activities Evidence for depression or mood disorders  The patient's weight, height, and BMI have been recorded in the chart.  I have made referrals, counseling, and provided education to the patient based on review of the above and I have provided the patient with a written personalized care plan for preventive services.     Crist Fat, MD   10/15/2022

## 2022-10-15 NOTE — Assessment & Plan Note (Signed)
She has diabetes with CAD and history of stroke with PVD.  We will continue with risk factor modification.  She is not on any diabetic meds but controls this with diet.

## 2022-10-15 NOTE — Assessment & Plan Note (Signed)
She unfortunately developed pulmonary fibrosis after COVID in 2021.  She has seen pulmonary for this.  She has actually improved with her walking baseline this past year.

## 2022-10-15 NOTE — Assessment & Plan Note (Signed)
She has some mild wheezing today.  I asked her to start taking her albuterol as needed.

## 2022-10-15 NOTE — Assessment & Plan Note (Signed)
She has a statin intolerance.  She cannot take PSK-9 inhibiotors.  Welchol caused myalgias.  She cannot take fenofibrate due to mylagias.  She had diarrhea from zetia.  The patient refused nexletol as she was afraid of pancreatitis.  We will check her FLP today.

## 2022-10-15 NOTE — Assessment & Plan Note (Signed)
She has chronic cough that allergy has been following. She was on Zyrtec and monteulkast but not Flonase or any inhalers. She also remains on omeprazole for her GERD. She was also on inhaled atrovent but she states none of these meds helped so she stopped them this past year.  She will followup with allergy as well.

## 2022-10-15 NOTE — Assessment & Plan Note (Signed)
She has a history of pancreatitis probably due to her PSK-9 inhibitor.  We will not retry this class.

## 2022-10-15 NOTE — Assessment & Plan Note (Signed)
She has had multiple strokes and in the past year, there has been no change in her motor function on her left side.  She did complete therapies.  We will continue secondary prevention.  She remains on  xarelto 15mg  daily and ASA 81mg  daily.  She has seen neurology this past year.

## 2022-10-15 NOTE — Assessment & Plan Note (Signed)
She has chronic respiratory failure but does not need supplemental oxygen.

## 2022-10-15 NOTE — Assessment & Plan Note (Signed)
She had a memory deficit and was started on aricept.  Last year her MMSE was 29/30 and today in 2024, she scored a 27/30.  She does not have alzheimers dementia.  We will continue to follow.

## 2022-10-15 NOTE — Assessment & Plan Note (Signed)
Her A. Fib is currently controlled and I hear sinus rhythm on exam today.  They stopped her off amiodarone during one of her hospitailzations last year.  I am going to again refer back to Dr. Kirtland Bouchard.  She will remain on lifelong anticoagulation.

## 2022-10-15 NOTE — Assessment & Plan Note (Signed)
She has a history of kidney stones with ureteral stents in the past.  This is now resolved.  She has seen urology last year.

## 2022-10-15 NOTE — Assessment & Plan Note (Signed)
She remains on omeprazole for her eosinphilic esophagitis. She denies any problems with dysphagia today.

## 2022-10-15 NOTE — Assessment & Plan Note (Signed)
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

## 2022-10-15 NOTE — Assessment & Plan Note (Signed)
She has diabetic nephropathy where we will check her urine studies today.  We will control her HgBA1c with goal <7%.

## 2022-10-15 NOTE — Assessment & Plan Note (Signed)
She has Stage IIIb CKD due to her HTN and DM.  I want her to avoid NSAIDS at this time.  Her GFR this past year improved.  We will recheck this as well as her urine studies.

## 2022-10-15 NOTE — Assessment & Plan Note (Signed)
Her husband states she has a history of sleep apnea where she had a sleep study.  She was prescribed a CPAP but the patient states she could not tolerate this.

## 2022-10-15 NOTE — Assessment & Plan Note (Signed)
She has had multiple strokes.  Plan as above.

## 2022-10-16 LAB — CBC WITH DIFFERENTIAL/PLATELET
Basophils Absolute: 0.2 10*3/uL (ref 0.0–0.2)
Basos: 1 %
EOS (ABSOLUTE): 0.6 10*3/uL — ABNORMAL HIGH (ref 0.0–0.4)
Eos: 3 %
Hematocrit: 38.1 % (ref 34.0–46.6)
Hemoglobin: 11.6 g/dL (ref 11.1–15.9)
Immature Grans (Abs): 0.1 10*3/uL (ref 0.0–0.1)
Immature Granulocytes: 1 %
Lymphocytes Absolute: 3.4 10*3/uL — ABNORMAL HIGH (ref 0.7–3.1)
Lymphs: 20 %
MCH: 25.3 pg — ABNORMAL LOW (ref 26.6–33.0)
MCHC: 30.4 g/dL — ABNORMAL LOW (ref 31.5–35.7)
MCV: 83 fL (ref 79–97)
Monocytes Absolute: 0.9 10*3/uL (ref 0.1–0.9)
Monocytes: 5 %
Neutrophils Absolute: 11.8 10*3/uL — ABNORMAL HIGH (ref 1.4–7.0)
Neutrophils: 70 %
Platelets: 334 10*3/uL (ref 150–450)
RBC: 4.59 x10E6/uL (ref 3.77–5.28)
RDW: 15.9 % — ABNORMAL HIGH (ref 11.7–15.4)
WBC: 16.9 10*3/uL — ABNORMAL HIGH (ref 3.4–10.8)

## 2022-10-16 LAB — LIPID PANEL
Chol/HDL Ratio: 4.4 ratio (ref 0.0–4.4)
Cholesterol, Total: 234 mg/dL — ABNORMAL HIGH (ref 100–199)
HDL: 53 mg/dL (ref >39)
LDL Chol Calc (NIH): 151 mg/dL — ABNORMAL HIGH (ref 0–99)
Triglycerides: 166 mg/dL — ABNORMAL HIGH (ref 0–149)
VLDL Cholesterol Cal: 30 mg/dL (ref 5–40)

## 2022-10-16 LAB — CMP14 + ANION GAP
ALT: 8 IU/L (ref 0–32)
AST: 15 IU/L (ref 0–40)
Albumin: 3.9 g/dL (ref 3.8–4.8)
Alkaline Phosphatase: 100 IU/L (ref 44–121)
Anion Gap: 17 mmol/L (ref 10.0–18.0)
BUN/Creatinine Ratio: 20 (ref 12–28)
BUN: 23 mg/dL (ref 8–27)
Bilirubin Total: <0.2 mg/dL (ref 0.0–1.2)
CO2: 20 mmol/L (ref 20–29)
Calcium: 9.6 mg/dL (ref 8.7–10.3)
Chloride: 109 mmol/L — ABNORMAL HIGH (ref 96–106)
Creatinine, Ser: 1.14 mg/dL — ABNORMAL HIGH (ref 0.57–1.00)
Globulin, Total: 4 g/dL (ref 1.5–4.5)
Glucose: 109 mg/dL — ABNORMAL HIGH (ref 70–99)
Potassium: 4 mmol/L (ref 3.5–5.2)
Sodium: 146 mmol/L — ABNORMAL HIGH (ref 134–144)
Total Protein: 7.9 g/dL (ref 6.0–8.5)
eGFR: 50 mL/min/{1.73_m2} — ABNORMAL LOW (ref >59)

## 2022-10-16 LAB — T4, FREE: Free T4: 1.04 ng/dL (ref 0.82–1.77)

## 2022-10-16 LAB — VITAMIN D 25 HYDROXY (VIT D DEFICIENCY, FRACTURES): Vit D, 25-Hydroxy: 40.9 ng/mL (ref 30.0–100.0)

## 2022-10-16 LAB — TSH: TSH: 1.94 u[IU]/mL (ref 0.450–4.500)

## 2022-10-16 LAB — MICROALBUMIN / CREATININE URINE RATIO
Creatinine, Urine: 55.3 mg/dL
Microalb/Creat Ratio: 66 mg/g creat — ABNORMAL HIGH (ref 0–29)
Microalbumin, Urine: 36.7 ug/mL

## 2022-10-16 LAB — HEMOGLOBIN A1C
Est. average glucose Bld gHb Est-mCnc: 128 mg/dL
Hgb A1c MFr Bld: 6.1 % — ABNORMAL HIGH (ref 4.8–5.6)

## 2022-10-17 NOTE — Progress Notes (Signed)
  Cardiology Office Note:  .   Date:  10/17/2022  ID:  Jenny Kelly, DOB 04/29/1945, MRN [id number] PCP: Crist Fat, MD  [facility] Providers Cardiologist:  None { Click to update primary MD,subspecialty MD or APP then REFRESH:1}   History of Present Illness: .   Jenny Kelly is a 77 y.o. female PAF, cerebrovascular disease with multiple strokes, CAD s/p CABG times 07/10/2013, hypertension, chronic diastolic heart failure, chronic venous insufficiency, DM2, pulmonary fibrosis, sleep apnea, COPD, GERD, hypothyroidism, CKD stage IIIb, hyperlipidemia intolerant to statins, pancreatitis.  Evaluated by Dr. 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  She was not feeling well, complaining of dizzy spells as well as headaches.  She was started on Zetia.  Recent labs on 10/15/2022 sodium 146, creatinine 1.14, GFR 50, normal LFTs, LDL 151, normal H&H.  Previously been on pravastatin, Zetia, Nexletol, Praluent, Lipitor, Crestor, fenofibrate  ROS: ***  Studies Reviewed: .     *** Risk Assessment/Calculations:   {Does this patient have ATRIAL FIBRILLATION?:[id number]} No BP recorded.  {Refresh Note OR Click here to enter BP  :1}***       Physical Exam:   VS:  There were no vitals taken for this visit.   Wt Readings from Last 3 Encounters:  10/15/22 122 lb (55.3 kg)  06/27/22 120 lb 9.6 oz (54.7 kg)  06/16/22 116 lb 13.5 oz (53 kg)    GEN: Well nourished, well developed in no acute distress NECK: No JVD; No carotid bruits CARDIAC: ***RRR, no murmurs, rubs, gallops RESPIRATORY:  Clear to auscultation without rales, wheezing or rhonchi  ABDOMEN: Soft, non-tender, non-distended EXTREMITIES:  No edema; No deformity   ASSESSMENT AND PLAN: .   CAD PAF  Hyperlipidemia/intolerant of statins     {Are you ordering a CV Procedure (e.g. stress  test, cath, DCCV, TEE, etc)?   Press F2        :[id number]}  Dispo: ***  Signed, Flossie Dibble, NP

## 2022-10-18 ENCOUNTER — Encounter: Payer: Self-pay | Admitting: Cardiology

## 2022-10-18 ENCOUNTER — Other Ambulatory Visit: Payer: Self-pay

## 2022-10-18 ENCOUNTER — Ambulatory Visit: Payer: Medicare HMO | Attending: Cardiology | Admitting: Cardiology

## 2022-10-18 VITALS — BP 124/84 | HR 76 | Ht 62.0 in | Wt 123.8 lb

## 2022-10-18 DIAGNOSIS — J8410 Pulmonary fibrosis, unspecified: Secondary | ICD-10-CM

## 2022-10-18 DIAGNOSIS — E785 Hyperlipidemia, unspecified: Secondary | ICD-10-CM | POA: Diagnosis not present

## 2022-10-18 DIAGNOSIS — I2510 Atherosclerotic heart disease of native coronary artery without angina pectoris: Secondary | ICD-10-CM | POA: Diagnosis not present

## 2022-10-18 DIAGNOSIS — I480 Paroxysmal atrial fibrillation: Secondary | ICD-10-CM | POA: Diagnosis not present

## 2022-10-18 DIAGNOSIS — I10 Essential (primary) hypertension: Secondary | ICD-10-CM | POA: Diagnosis not present

## 2022-10-18 DIAGNOSIS — Z951 Presence of aortocoronary bypass graft: Secondary | ICD-10-CM

## 2022-10-18 DIAGNOSIS — Z789 Other specified health status: Secondary | ICD-10-CM

## 2022-10-18 DIAGNOSIS — I6930 Unspecified sequelae of cerebral infarction: Secondary | ICD-10-CM

## 2022-10-18 MED ORDER — DAPAGLIFLOZIN PROPANEDIOL 10 MG PO TABS: 10.0000 mg | ORAL_TABLET | Freq: Every day | ORAL | 5 refills | Status: DC

## 2022-10-18 NOTE — Patient Instructions (Signed)
Medication Instructions:  Your physician recommends that you continue on your current medications as directed. Please refer to the Current Medication list given to you today.  *If you need a refill on your cardiac medications before your next appointment, please call your pharmacy*   Lab Work: NONE If you have labs (blood work) drawn today and your tests are completely normal, you will receive your results only by: MyChart Message (if you have MyChart) OR A paper copy in the mail If you have any lab test that is abnormal or we need to change your treatment, we will call you to review the results.   Testing/Procedures: NONE   Follow-Up: At [facility], you and your health needs are our priority.  As part of our continuing mission to provide you with exceptional heart care, we have created designated Provider Care Teams.  These Care Teams include your primary Cardiologist (physician) and Advanced Practice Providers (APPs -  Physician Assistants and Nurse Practitioners) who all work together to provide you with the care you need, when you need it.  We recommend signing up for the patient portal called "MyChart".  Sign up information is provided on this After Visit Summary.  MyChart is used to connect with patients for Virtual Visits (Telemedicine).  Patients are able to view lab/test results, encounter notes, upcoming appointments, etc.  Non-urgent messages can be sent to your provider as well.   To learn more about what you can do with MyChart, go to [email or web address].    Your next appointment:   6 month(s)  Provider:   Robert Krasowski, MD    Other Instructions   

## 2022-11-27 ENCOUNTER — Encounter: Payer: Self-pay | Admitting: Allergy

## 2022-11-27 ENCOUNTER — Ambulatory Visit: Payer: Medicare HMO | Admitting: Allergy

## 2022-11-27 VITALS — BP 96/58 | HR 88 | Temp 98.1°F | Resp 18 | Ht 62.0 in | Wt 123.0 lb

## 2022-11-27 DIAGNOSIS — K219 Gastro-esophageal reflux disease without esophagitis: Secondary | ICD-10-CM

## 2022-11-27 DIAGNOSIS — J4489 Other specified chronic obstructive pulmonary disease: Secondary | ICD-10-CM | POA: Diagnosis not present

## 2022-11-27 DIAGNOSIS — J310 Chronic rhinitis: Secondary | ICD-10-CM | POA: Diagnosis not present

## 2022-11-27 MED ORDER — RYALTRIS 665-25 MCG/ACT NA SUSP: 2.0000 | Freq: Two times a day (BID) | NASAL | 5 refills | Status: DC

## 2022-11-27 MED ORDER — CARBINOXAMINE MALEATE 6 MG PO TABS: 6.0000 mg | ORAL_TABLET | Freq: Two times a day (BID) | ORAL | 5 refills | Status: AC | PRN

## 2022-11-27 NOTE — Progress Notes (Signed)
Follow-up Note  RE: Jenny Kelly MRN: [id number] DOB: Feb 01, 1946 Date of Office Visit: 11/27/2022   History of present illness: Jenny Kelly is a 77 y.o. female presenting today for nasal drip.  She presents today with her husband.  She was last seen in the office on 03/15/21 by Dr Lucie Leather for COPD with asthma component, allergic rhinitis and LPR.   Husband states nothing much is changed since she was last here in regards to her nasal drip.  She states she has a constant nasal drip out the nose for years.  She is tired of the nasal drip.  She denies it does not drain down the back of her throat.  Husband does not notice her throat clearing.  She always has a tissue in her hands for the nasal drip.  She has tried claritin, zyrtec, montelukast without any improvement.  She has even take benadryl which also does not help. She has been on nasal sprays fluticasone, atrovent, azelastine which have not been any benefit.  Most recently was represcribed nasal Atrovent from her PCP.  She has done nasal saline rinse including navage which also did not seem to provide any benefit.   Not currently taking any nasal sprays or antihistamine as they do not seem to help. She had allergy skin testing done in 2022 that was nonreactive.  She has not been to ENT at this point. She has history of COPD with asthma.  Husband states she has been doing quite well without symptoms for very long time now.  She has an albuterol inhaler and albuterol for nebulizer that she has not needed to use in> 6 months.  He states she has not been on budesonide via nebulizer in quite some time now likely since the last visit.  She has supplemental O2 at home for as needed use which she very rarely needs for her husband.   Still taking omeprazole and famotidine for reflux control and reports they are effective.  She believes yesterday she had a little seizure as she states she did not remember that she was married to her who her husband  was.  She states her grandchildren were around and they were concerned that something was happening with her.  She does have history of seizure and is on topiramate.  Review of systems: 10pt ROS negative unless noted above in HPI  Past medical/social/surgical/family history have been reviewed and are unchanged unless specifically indicated below.  No changes  Medication List: Current Outpatient Medications  Medication Sig Dispense Refill   albuterol (VENTOLIN HFA) 108 (90 Base) MCG/ACT inhaler Inhale into the lungs.     aspirin 81 MG EC tablet Take 1 tablet (81 mg total) by mouth daily. Swallow whole. 30 tablet 0   dapagliflozin propanediol (FARXIGA) 10 MG TABS tablet Take 1 tablet (10 mg total) by mouth daily before breakfast. 30 tablet 5   donepezil (ARICEPT) 10 MG tablet TAKE 1 TABLET BY MOUTH EVERY DAY IN THE EVENING 90 tablet 1   guaiFENesin (MUCINEX) 600 MG 12 hr tablet Take 600 mg by mouth daily as needed for cough or to loosen phlegm.     levothyroxine (SYNTHROID) 88 MCG tablet Take 1 tablet by mouth once daily 90 tablet 0   loperamide (IMODIUM) 2 MG capsule TAKE 1 CAPSULE BY MOUTH 4 TIMES DAILY AS NEEDED FOR DIARRHEA OR LOOSE STOOLS     nitroGLYCERIN (NITROSTAT) 0.4 MG SL tablet Place 0.4 mg under the tongue every 5 (five) minutes  as needed.     Nystatin (GERHARDT'S BUTT CREAM) CREA Apply 1 Application topically 3 (three) times daily. 1 each 0   omeprazole (PRILOSEC) 40 MG capsule TAKE 1 CAPSULE BY MOUTH TWICE A DAY BEFORE A MEAL 180 capsule 1   pregabalin (LYRICA) 75 MG capsule Take 1 capsule (75 mg total) by mouth 3 (three) times daily. 90 capsule 5   Probiotic Product (PROBIOTIC PO) Take 15 Billion Cells by mouth daily.     Propylene Glycol (SYSTANE COMPLETE OP) Apply 1 drop to eye as needed (Burning/Irritation).     Rivaroxaban (XARELTO) 15 MG TABS tablet Take 1 tablet (15 mg total) by mouth daily. 90 tablet 3   topiramate (TOPAMAX) 200 MG tablet Take 1 tablet (200 mg total) by  mouth 2 (two) times daily. 180 tablet 0   triamcinolone cream (KENALOG) 0.1 % Apply 1 Application topically 2 (two) times daily. 80 g 0   ipratropium (ATROVENT) 0.03 % nasal spray Place 2 sprays into both nostrils every 12 (twelve) hours. (Patient not taking: Reported on 11/27/2022) 30 mL 12   No current facility-administered medications for this visit.     Known medication allergies: Allergies  Allergen Reactions   Ciprofloxacin Hives    Ask patient   Prochlorperazine Edisylate Other (See Comments)    Cant swallow Muscle cramping   Morphine Other (See Comments)    Confusion   Prednisone Other (See Comments)    unknown   Reduced Iso-Alpha Acids Complex Other (See Comments)    unknown   Statins Other (See Comments)    Muscle weakness    Sulfa Antibiotics Swelling   Azithromycin Rash   Latex Rash     Physical examination: Blood pressure (!) 96/58, pulse 88, temperature 98.1 F (36.7 C), temperature source Temporal, resp. rate 18, height 5\' 2"  (1.575 m), weight 123 lb (55.8 kg), SpO2 90%.  General: Alert, interactive, in no acute distress. HEENT: PERRLA, TMs pearly gray, turbinates non-edematous with clear discharge, post-pharynx non erythematous. Neck: Supple without lymphadenopathy. Lungs: Clear to auscultation without wheezing, rhonchi or rales. {no increased work of breathing. CV: Normal S1, S2 without murmurs. Abdomen: Nondistended, nontender. Skin: Warm and dry, without lesions or rashes. Extremities:  No clubbing, cyanosis or edema. Neuro:   Grossly intact.  Diagnositics/Labs: Spirometry: FEV1: 0.93L 49%, FVC: 1.63L 67% predicted.  This is an improved study from her previous in 2022 but does show severe obstructive pattern  Assessment and plan: Chronic rhinitis--> nasal drip - try nasal spray Ryaltris 2 sprays each nostril twice a day for nasal drip control.  This is a combination nasal spray that has Olopatadine (for nasal drainage) as well as Mometasone (nasal  steroid). This is a prescription based nasal spray that goes to a specialty pharmacy and gets mailed to the home.   Point tip of the bottle towards the eye on the same side nostril for best technique. - try antihistamine Ryvent (carbinoxamine) 4-6mg  1-2 times a day.  This is a prescription-based antihistamine but you have not tried it before. - If the above regimen is not effective then would recommend ENT referral to discuss potential cryotherapy for drainage control. -You have tried a variety of over-the-counter antihistamines as well as nasal sprays Fluticasone, Atrovent (ipratropium), Azelastine already which have not been effective  COPD with asthma overlap - At this time appears to be under control without any maintenance medications - Have access to albuterol inhaler 2 puffs or albuterol 1 vial via nebulizer every 4-6 hours as needed  for cough/wheeze/shortness of breath/chest tightness.  May use 15-20 minutes prior to activity.   Monitor frequency of use.   - Use supplemental oxygen if needed to maintain oxygen saturation to 92% at home  - Continue omeprazole and famotidine for reflux control  Follow-up in 3 months or sooner if needed  I appreciate the opportunity to take part in Shaquala's care. Please do not hesitate to contact me with questions.  Sincerely,   Margo Aye, MD Allergy/Immunology [facility]

## 2022-11-27 NOTE — Patient Instructions (Addendum)
-   try nasal spray Ryaltris 2 sprays each nostril twice a day for nasal drip control.  This is a combination nasal spray that has Olopatadine (for nasal drainage) as well as Mometasone (nasal steroid). This is a prescription based nasal spray that goes to a specialty pharmacy and gets mailed to the home.   Point tip of the bottle towards the eye on the same side nostril for best technique. - try antihistamine Ryvent (carbinoxamine) 4-6mg  1-2 times a day.  This is a prescription-based antihistamine but you have not tried it before. - If the above regimen is not effective then would recommend ENT referral to discuss potential cryotherapy for drainage control.  -You have tried a variety of over-the-counter antihistamines as well as nasal sprays Fluticasone, Atrovent (ipratropium), Azelastine already which have not been effective  - Have access to albuterol inhaler 2 puffs or albuterol 1 vial via nebulizer every 4-6 hours as needed for cough/wheeze/shortness of breath/chest tightness.  May use 15-20 minutes prior to activity.   Monitor frequency of use.   - Use supplemental oxygen if needed to maintain oxygen saturation to 92% at home  - Continue omeprazole and famotidine for reflux control  Follow-up in 3 months or sooner if needed

## 2022-11-29 ENCOUNTER — Telehealth: Payer: Self-pay

## 2022-11-29 ENCOUNTER — Other Ambulatory Visit (HOSPITAL_COMMUNITY): Payer: Self-pay

## 2022-11-29 NOTE — Telephone Encounter (Signed)
Pharmacy Patient Advocate Encounter   Received notification from CoverMyMeds that prior authorization for RyVent 6MG  tablets is required/requested.   Insurance verification completed.   The patient is insured through [employer] .   Per test claim: PA required; PA submitted to [facility] via CoverMyMeds Key/confirmation #/[id number] Status is pending

## 2022-11-30 NOTE — Telephone Encounter (Signed)
Pharmacy Patient Advocate Encounter  Received notification from [facility] that Prior Authorization for RyVent 6MG  tablets has been DENIED. Please advise how you'd like to proceed. Full denial letter will be uploaded to the media tab. See denial reason below.  The drug you asked for is not listed in your preferred drug list (formulary). The preferred drug(s), you may not have tried are: levocetirizine tablet. Your provider needs to give [place] medical reasons why the preferred drug(s) would not work for you and/or would have bad side effects.   PA #/Case ID/Reference #: [id number]

## 2022-12-03 ENCOUNTER — Other Ambulatory Visit (HOSPITAL_COMMUNITY): Payer: Self-pay

## 2022-12-03 NOTE — Telephone Encounter (Signed)
 Please see message from PA team below

## 2022-12-05 NOTE — Telephone Encounter (Signed)
Left message to call the office.

## 2022-12-06 ENCOUNTER — Other Ambulatory Visit: Payer: Self-pay | Admitting: *Deleted

## 2022-12-06 MED ORDER — LEVOCETIRIZINE DIHYDROCHLORIDE 5 MG PO TABS: ORAL_TABLET | ORAL | 5 refills | Status: AC

## 2022-12-06 NOTE — Telephone Encounter (Signed)
Patient informed and new rx sent

## 2023-01-02 ENCOUNTER — Other Ambulatory Visit: Payer: Self-pay | Admitting: Internal Medicine

## 2023-01-10 ENCOUNTER — Other Ambulatory Visit: Payer: Self-pay | Admitting: Internal Medicine

## 2023-01-14 ENCOUNTER — Encounter: Payer: Self-pay | Admitting: Internal Medicine

## 2023-01-14 ENCOUNTER — Ambulatory Visit: Payer: Medicare HMO | Admitting: Internal Medicine

## 2023-01-14 VITALS — BP 102/60 | HR 73 | Temp 97.3°F | Resp 18 | Ht 62.0 in | Wt 123.0 lb

## 2023-01-14 DIAGNOSIS — I480 Paroxysmal atrial fibrillation: Secondary | ICD-10-CM

## 2023-01-14 DIAGNOSIS — I25810 Atherosclerosis of coronary artery bypass graft(s) without angina pectoris: Secondary | ICD-10-CM | POA: Diagnosis not present

## 2023-01-14 DIAGNOSIS — I10 Essential (primary) hypertension: Secondary | ICD-10-CM | POA: Diagnosis not present

## 2023-01-14 DIAGNOSIS — Z23 Encounter for immunization: Secondary | ICD-10-CM

## 2023-01-14 DIAGNOSIS — N1832 Chronic kidney disease, stage 3b: Secondary | ICD-10-CM | POA: Diagnosis not present

## 2023-01-14 DIAGNOSIS — I5032 Chronic diastolic (congestive) heart failure: Secondary | ICD-10-CM

## 2023-01-14 DIAGNOSIS — E119 Type 2 diabetes mellitus without complications: Secondary | ICD-10-CM | POA: Insufficient documentation

## 2023-01-14 DIAGNOSIS — E1121 Type 2 diabetes mellitus with diabetic nephropathy: Secondary | ICD-10-CM | POA: Diagnosis not present

## 2023-01-14 DIAGNOSIS — I2510 Atherosclerotic heart disease of native coronary artery without angina pectoris: Secondary | ICD-10-CM | POA: Insufficient documentation

## 2023-01-14 NOTE — Assessment & Plan Note (Signed)
She has no evidence of volume overload.  She is not on a diuretic.

## 2023-01-14 NOTE — Assessment & Plan Note (Signed)
She was spilling protein in her urine studies.  I started her on farxiga.  We will recheck her urine studies on her next visit.  Continue to avoid NSAIDS.

## 2023-01-14 NOTE — Assessment & Plan Note (Signed)
Her BP is usually on the lower side of normal but she denies any symptoms of hypotension.  We will continue to monitor.

## 2023-01-14 NOTE — Assessment & Plan Note (Signed)
She controls her diabetes with diet but she is now on farxiga for her CKD.  We will check her HgBa1c on her next visit.

## 2023-01-14 NOTE — Assessment & Plan Note (Signed)
She is not on any rate control or rhythm control meds but she seems to be in A. Fib.  We will continue anticoagulation at this time.

## 2023-01-14 NOTE — Progress Notes (Signed)
Office Visit  Subjective   Patient ID: Jenny Kelly   DOB: Aug 03, 1945   Age: 77 y.o.   MRN: [id number]   Chief Complaint No chief complaint on file.    History of Present Illness The patient is a 77 year old Caucasian/White female who returns for a follow-up visit for her diet controlled diabetes. Since the last visit, there have been no problems. She remains on no medications; the diabetes is being managed by dietary intervention alone. She is not walking as much as they would like.. She specifically denies unexplained abdominal pain, nausea or vomiting and documented hypoglycemia. She has not been checking her blood sugars at home as they do not have a glucometer. She came in fasting today in anticipation of lab work. Her last HgBA1c was done 3 months ago and was 6.1%.  There are no complications of diabetic retinopathy or neuropathy.  She has CKD probably secondary to her HTN and diabetic nephropathy.  She has a history of cardiovascular diease as well and history of stroke but her diabetes has always been controlled.   The patient has a history of Stage IIIb CKD.  She was seeing the nephrologist once every 3-4 months with her last visit was in 2019 and she states that she is not going back.  Her creatinine at that time was 1.5.  Her GFR is around from 30-50 however she had recent labs this past year that showed her GFR was greater than 60.   We have noted that she went from Stage IIIa to Stage IIIb CKD over the past 1-2 years.  We did a urine study with albumin/creatinine ratio on her last visit and she was spilling protein.  We therefore started her on farxiga 10mg  daily.  The patient is a 77 year old Caucasian/White female who returns today for followup of her hypertension.  Since her last visit, her patient has not had any problems.  The patient has  been checking her BP at home.   They states her systolic BP runs 578-469.  The patient's current medications include:  none.  She used to be on  midodrine 10 mg oral tablet TID.  She has had problems with orthostatic hypotension in the past but has not had a problems with this for years.  The patient denies any dizziness, lightheadness, chest pain, and shortness of breath.    This patient also has moderately severe CAD where she was diagnosed with a heart attack in 06/2000 and presents today for a status visit. See PMH for summary of cardiac history and status of revascularization. She underwent cardiac catherization in 2002 and had stent placement. The patient undewent Lexiscan nuclear stress test on 09/2012 and this showed no evidence of ischemia and a normal systolic function.  However in 11/2013, she was complaining of some chest discomfort to me.  I sent her to see Cardiology where ultimately she was admitted to [facility] in 11/2013 and she had a heart catherization and eventual 3 vessel CABG performed.  She has been following with cardiology regularly and has not had any CP, or other problems.   Her CAD is controlled with therapy as summarized in the medication list and previous notes. The patient has the following comorbid condition(s): atrial fibrillation. She has no baseline symptoms of CAD. She has the following modifiable risk factor(s): DM, hyperlipidemia, and sedentary lifestyle. Specifically denied complaint(s): chest pain, palpitations, edema, exertional dyspnea which she attributes to her COPD, and syncope.  She last saw  Cardiology 10/18/2022 and they felt her CAD and A. Fib were stable.  She also has a history of chronic diastolic CHF.  Apparently in 03/2018, she had acute on chronic diastolic CHF where they did diuresis.  Today, she denies any swelling in her bilateral lower feet.  We started her on some prn Lasix which has not had to use in over 4-5 years. She also has a history of chronic venous insuffiency but again no recent swelling.  She has not had to use compression hose in over 4-5 years.    This patient also has mild atrial  fibrillation of about years known duration and presents today for a status visit. Her atrial fibrillation is controlled with therapy as summarized in the medication list and previous notes.  The patient was on amiodarone but this was stopped when she was hospitalized this past year.  She remains anticoagulated with Xarelto.  Specifically denied complaints: chest pain, palpitations, TIAs, edema, exertional dyspnea, and syncope. Interval history: no healthcare visits with other providers since last seen in this office. Interval studies: none. Anticoagulation status: Xarelto 15mg  daily.   Her CHAD-VASC2 score is a 9. There is no bleeding, bruising, BRBPR or melena.       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   Allergy 06/09/2021   Anemia    Anxiety    Arthritis    Asthma    Atrial fibrillation (HCC)    Blood transfusion without reported diagnosis    Cataract    CHF (congestive heart failure) (HCC)    Chronic kidney disease    Clotting disorder (HCC)    COPD (chronic obstructive pulmonary disease) (HCC)    Dementia (HCC)    Depression    Diabetes mellitus without complication (HCC)    Dyspnea    GERD (gastroesophageal reflux disease)    Great toe pain, left 11/01/2021   Headache    Heart murmur    History of CVA with residual deficit 11/01/2021   History of kidney stones    Hyperlipidemia    Hypothyroidism    Myocardial infarction [facility])    Neuromuscular disorder (HCC)    tremors   Pneumonia due to COVID-19 virus    Postoperative wound infection 03/10/2014   Seizures (HCC)    Sleep apnea    Stroke (HCC)    Tension-type headache, not intractable 01/10/2016   Thyroid disease    Wide-complex tachycardia      Allergies Allergies  Allergen Reactions   Ciprofloxacin Hives     Ask patient   Prochlorperazine Edisylate Other (See Comments)    Cant swallow Muscle cramping   Morphine Other (See Comments)    Confusion   Prednisone Other (See Comments)    unknown   Reduced Iso-Alpha Acids Complex Other (See Comments)    unknown   Statins Other (See Comments)    Muscle weakness    Sulfa Antibiotics Swelling   Azithromycin Rash   Latex Rash     Medications  Current Outpatient Medications:    albuterol (VENTOLIN HFA) 108 (90 Base) MCG/ACT inhaler, Inhale into the lungs., Disp: , Rfl:    aspirin 81 MG EC tablet, Take 1 tablet (81 mg total) by mouth daily. Swallow whole., Disp: 30 tablet, Rfl: 0   Carbinoxamine Maleate (RYVENT) 6 MG  TABS, Take 1 tablet (6 mg total) by mouth 2 (two) times daily as needed., Disp: 60 tablet, Rfl: 5   dapagliflozin propanediol (FARXIGA) 10 MG TABS tablet, Take 1 tablet (10 mg total) by mouth daily before breakfast., Disp: 30 tablet, Rfl: 5   donepezil (ARICEPT) 10 MG tablet, TAKE 1 TABLET BY MOUTH EVERY DAY IN THE EVENING, Disp: 90 tablet, Rfl: 1   guaiFENesin (MUCINEX) 600 MG 12 hr tablet, Take 600 mg by mouth daily as needed for cough or to loosen phlegm., Disp: , Rfl:    ipratropium (ATROVENT) 0.03 % nasal spray, Place 2 sprays into both nostrils every 12 (twelve) hours. (Patient not taking: Reported on 11/27/2022), Disp: 30 mL, Rfl: 12   levocetirizine (XYZAL) 5 MG tablet, Take one tablet once daily, Disp: 30 tablet, Rfl: 5   levothyroxine (SYNTHROID) 88 MCG tablet, Take 1 tablet by mouth once daily, Disp: 90 tablet, Rfl: 0   loperamide (IMODIUM) 2 MG capsule, TAKE 1 CAPSULE BY MOUTH 4 TIMES DAILY AS NEEDED FOR DIARRHEA OR LOOSE STOOLS, Disp: , Rfl:    nitroGLYCERIN (NITROSTAT) 0.4 MG SL tablet, Place 0.4 mg under the tongue every 5 (five) minutes as needed., Disp: , Rfl:    Nystatin (GERHARDT'S BUTT CREAM) CREA, Apply 1 Application topically 3 (three) times daily., Disp: 1 each, Rfl: 0   Olopatadine-Mometasone (RYALTRIS) 665-25  MCG/ACT SUSP, Place 2 sprays into both nostrils in the morning and at bedtime., Disp: 29 g, Rfl: 5   omeprazole (PRILOSEC) 40 MG capsule, TAKE 1 CAPSULE BY MOUTH TWICE A DAY BEFORE A MEAL, Disp: 180 capsule, Rfl: 1   pregabalin (LYRICA) 75 MG capsule, Take 1 capsule (75 mg total) by mouth 3 (three) times daily., Disp: 90 capsule, Rfl: 5   Probiotic Product (PROBIOTIC PO), Take 15 Billion Cells by mouth daily., Disp: , Rfl:    Propylene Glycol (SYSTANE COMPLETE OP), Apply 1 drop to eye as needed (Burning/Irritation)., Disp: , Rfl:    Rivaroxaban (XARELTO) 15 MG TABS tablet, Take 1 tablet (15 mg total) by mouth daily., Disp: 90 tablet, Rfl: 3   topiramate (TOPAMAX) 200 MG tablet, Take 1 tablet by mouth twice daily, Disp: 180 tablet, Rfl: 0   triamcinolone cream (KENALOG) 0.1 %, Apply 1 Application topically 2 (two) times daily., Disp: 80 g, Rfl: 0   Review of Systems Review of Systems  Constitutional:  Negative for chills and fever.  Eyes:  Negative for blurred vision and double vision.  Respiratory:  Positive for cough. Negative for hemoptysis, shortness of breath and wheezing.   Cardiovascular:  Negative for chest pain, palpitations and leg swelling.  Gastrointestinal:  Negative for abdominal pain, blood in stool, constipation, diarrhea, melena, nausea and vomiting.  Genitourinary:  Negative for hematuria.  Musculoskeletal:  Negative for myalgias.  Skin:  Negative for itching and rash.  Neurological:  Positive for headaches. Negative for dizziness and weakness.  Endo/Heme/Allergies:  Negative for polydipsia.  Psychiatric/Behavioral:  Negative for depression. The patient is not nervous/anxious.        Objective:    Vitals BP 102/60 (BP Location: Left Arm, Patient Position: Sitting, Cuff Size: Normal)   Pulse 73   Temp (!) 97.3 F (36.3 C)   Resp 18   Ht 5\' 2"  (1.575 m)   Wt 123 lb (55.8 kg)   BMI 22.50 kg/m   on recheck her oxygen sats were 97% on RA  Physical  Examination Physical Exam Constitutional:      Appearance: Normal appearance. She  is not ill-appearing.  Cardiovascular:     Rate and Rhythm: Normal rate and regular rhythm.     Pulses: Normal pulses.     Heart sounds: No murmur heard.    No friction rub. No gallop.  Pulmonary:     Effort: Pulmonary effort is normal. No respiratory distress.     Breath sounds: No wheezing, rhonchi or rales.  Abdominal:     General: Bowel sounds are normal. There is no distension.     Palpations: Abdomen is soft.     Tenderness: There is no abdominal tenderness.  Musculoskeletal:     Right lower leg: No edema.     Left lower leg: No edema.  Skin:    General: Skin is warm and dry.     Findings: No rash.  Neurological:     General: No focal deficit present.     Mental Status: She is alert and oriented to person, place, and time.  Psychiatric:        Mood and Affect: Mood normal.        Behavior: Behavior normal.        Assessment & Plan:   PAF (paroxysmal atrial fibrillation) (HCC) She is not on any rate control or rhythm control meds but she seems to be in A. Fib.  We will continue anticoagulation at this time.  Essential hypertension Her BP is usually on the lower side of normal but she denies any symptoms of hypotension.  We will continue to monitor.  Coronary artery disease involving coronary bypass graft of native heart without angina pectoris Her CAD seems stable and she denies any symptoms of angina.  She is on xarelto and ASA 81mg  daily.  She is statin intolerant.  Chronic diastolic CHF (congestive heart failure) (HCC) She has no evidence of volume overload.  She is not on a diuretic.  Diabetic nephropathy associated with type 2 diabetes mellitus (HCC) She controls her diabetes with diet but she is now on farxiga for her CKD.  We will check her HgBa1c on her next visit.  Stage 3b chronic kidney disease (HCC) She was spilling protein in her urine studies.  I started her on  farxiga.  We will recheck her urine studies on her next visit.  Continue to avoid NSAIDS.    Return in about 3 months (around 04/15/2023).   Crist Fat, MD

## 2023-01-14 NOTE — Assessment & Plan Note (Signed)
Her CAD seems stable and she denies any symptoms of angina.  She is on xarelto and ASA 81mg  daily.  She is statin intolerant.

## 2023-01-20 IMAGING — DX DG CHEST 1V PORT
1 series · 1 of 1 positions shown · non-contrast
Comparison: 06/20/2020 chest CT, radiograph 05/01/2021, 03/19/2021,
03/24/2020

CLINICAL DATA: Shortness of breath

EXAM:
PORTABLE CHEST 1 VIEW

[chest ap]
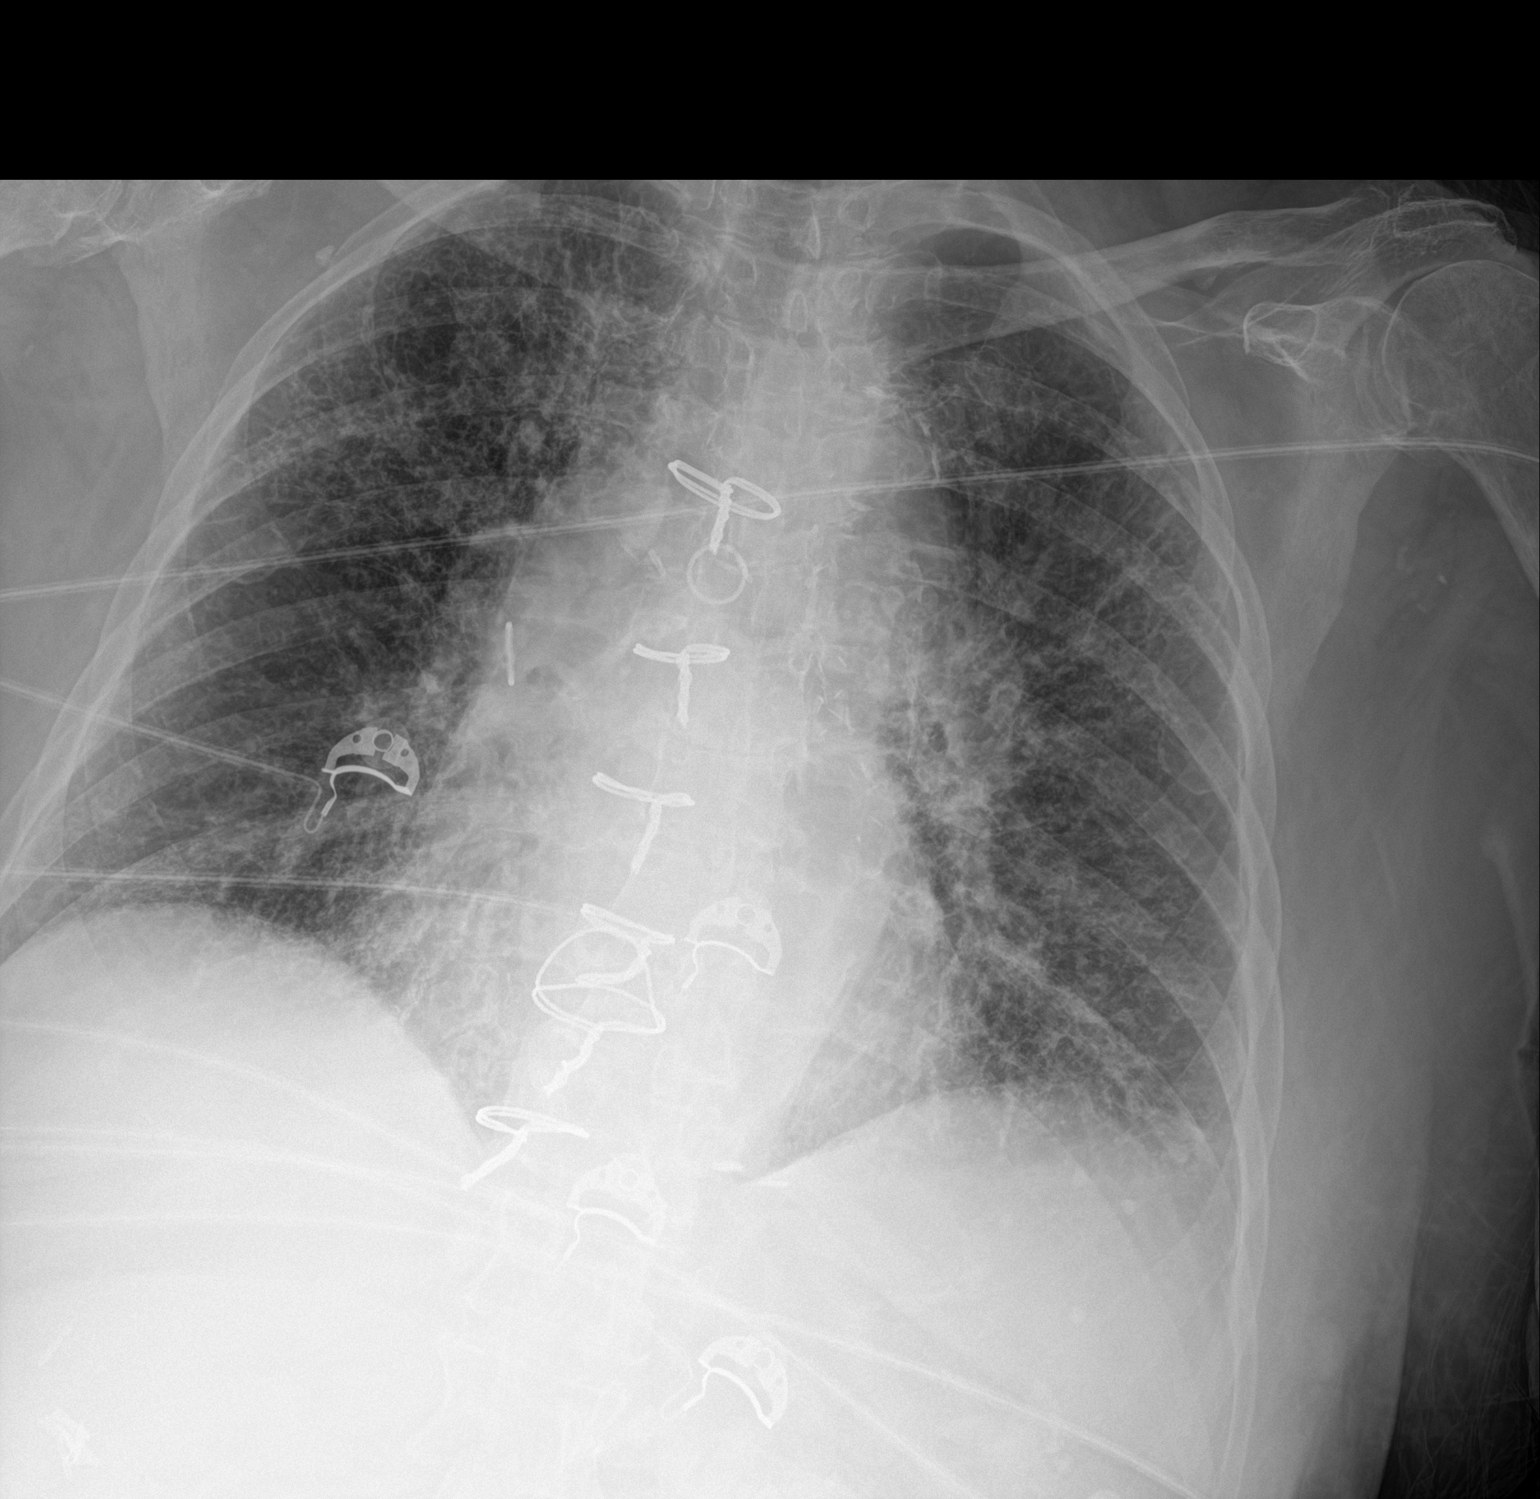

[1 of 1 positions shown; findings below may reference images not displayed]

FINDINGS: Diffuse bilateral reticular opacity consistent with chronic
interstitial lung disease. No confluent acute airspace disease,
pleural effusion or pneumothorax. Stable cardiomediastinal
silhouette. Post sternotomy changes. Aortic atherosclerosis.
IMPRESSION: Chronic interstitial lung disease without acute superimposed
airspace disease

## 2023-01-20 IMAGING — RF DG C-ARM 1-60 MIN
1 series · 4 of 4 positions shown · non-contrast
Comparison: None.

CLINICAL DATA: Cystoscopy with retrograde pyelogram

EXAM:
DG C-ARM 1-60 MIN
FLUOROSCOPY TIME:  Fluoroscopy Time:  14 seconds
Radiation Exposure Index (if provided by the fluoroscopic device):
3.49 mGy
Number of Acquired Spot Images: 0

[Series 1: run · 4 of 4 slices shown]
[im 1/4]
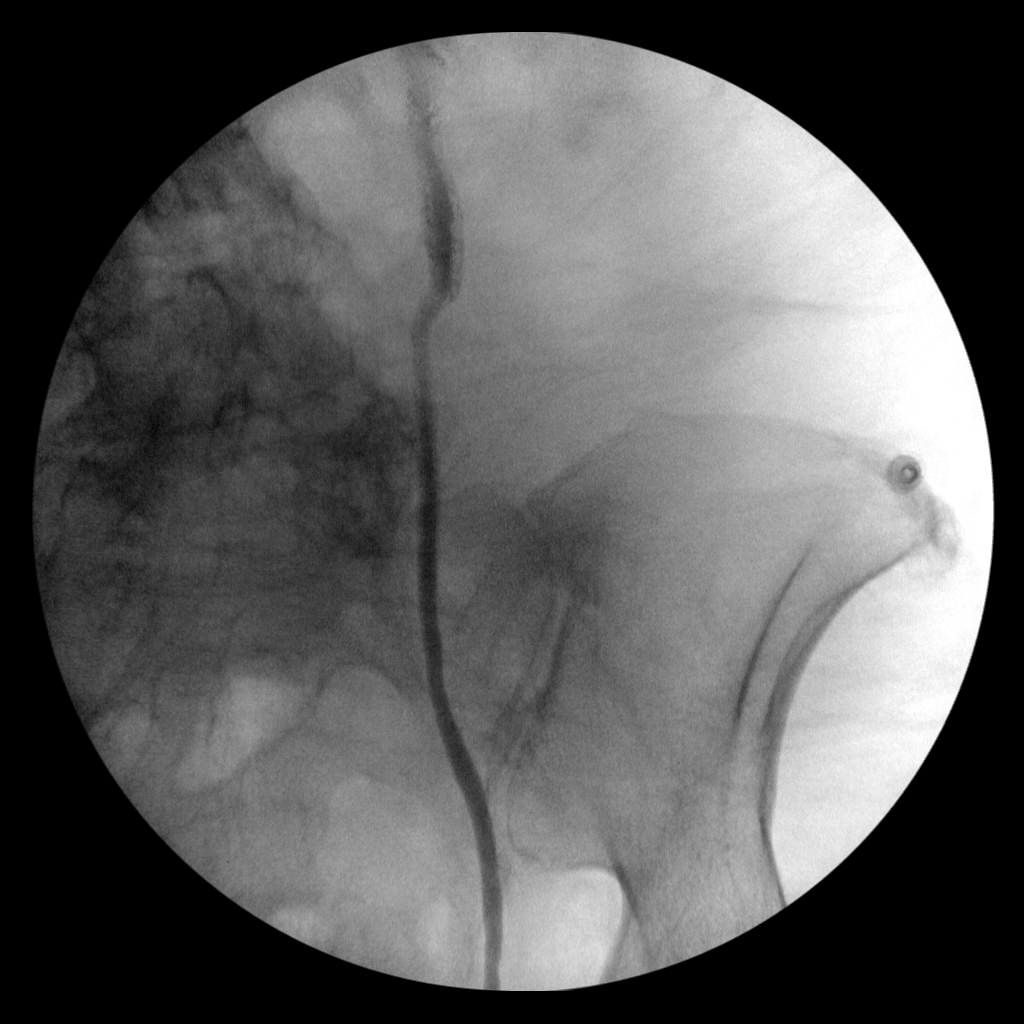
[im 2/4]
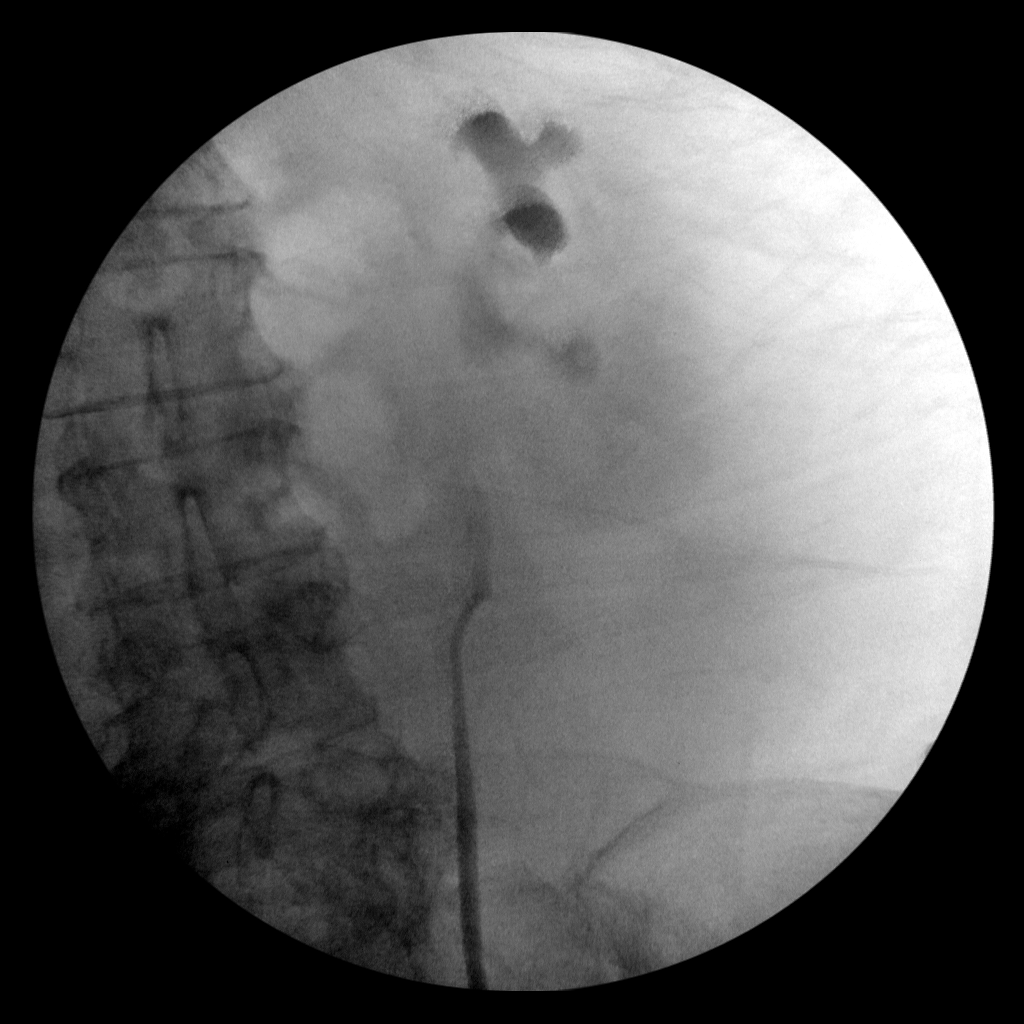
[im 3/4]
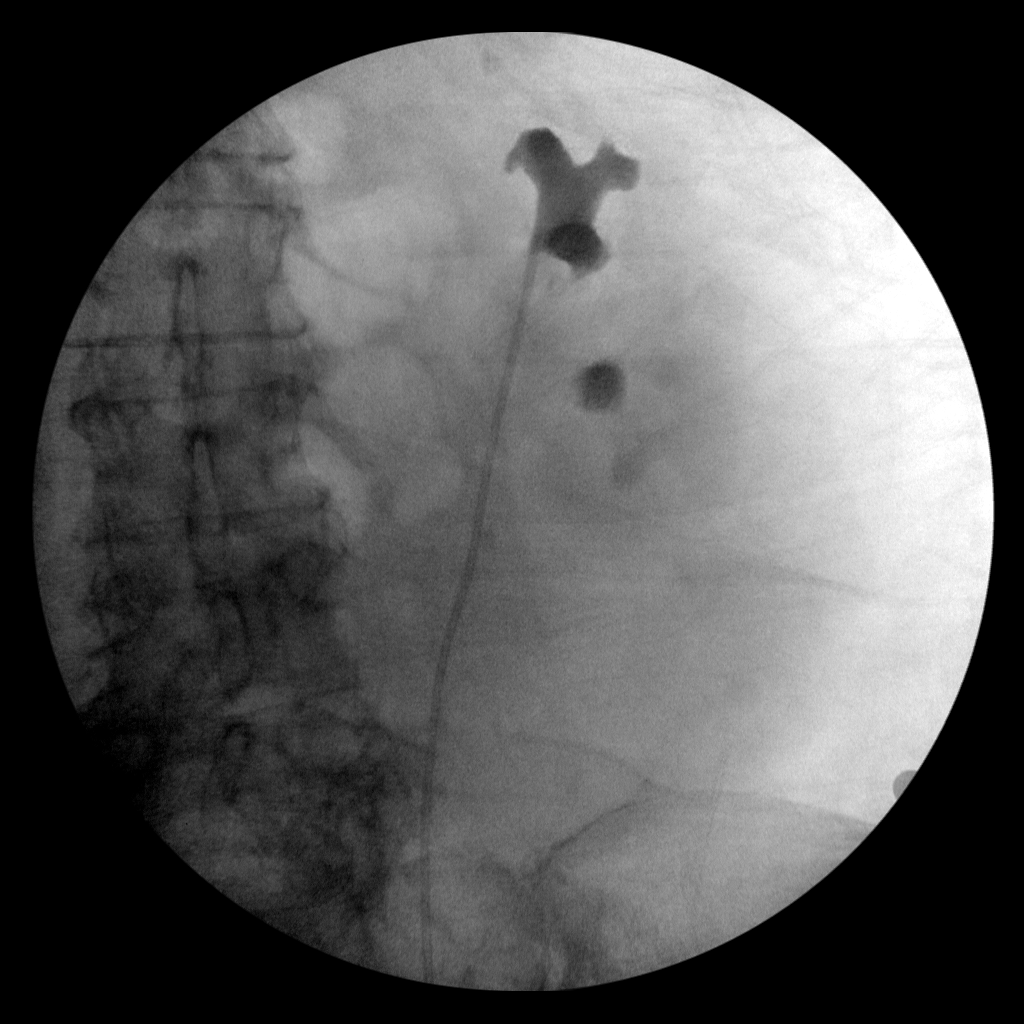
[im 4/4]
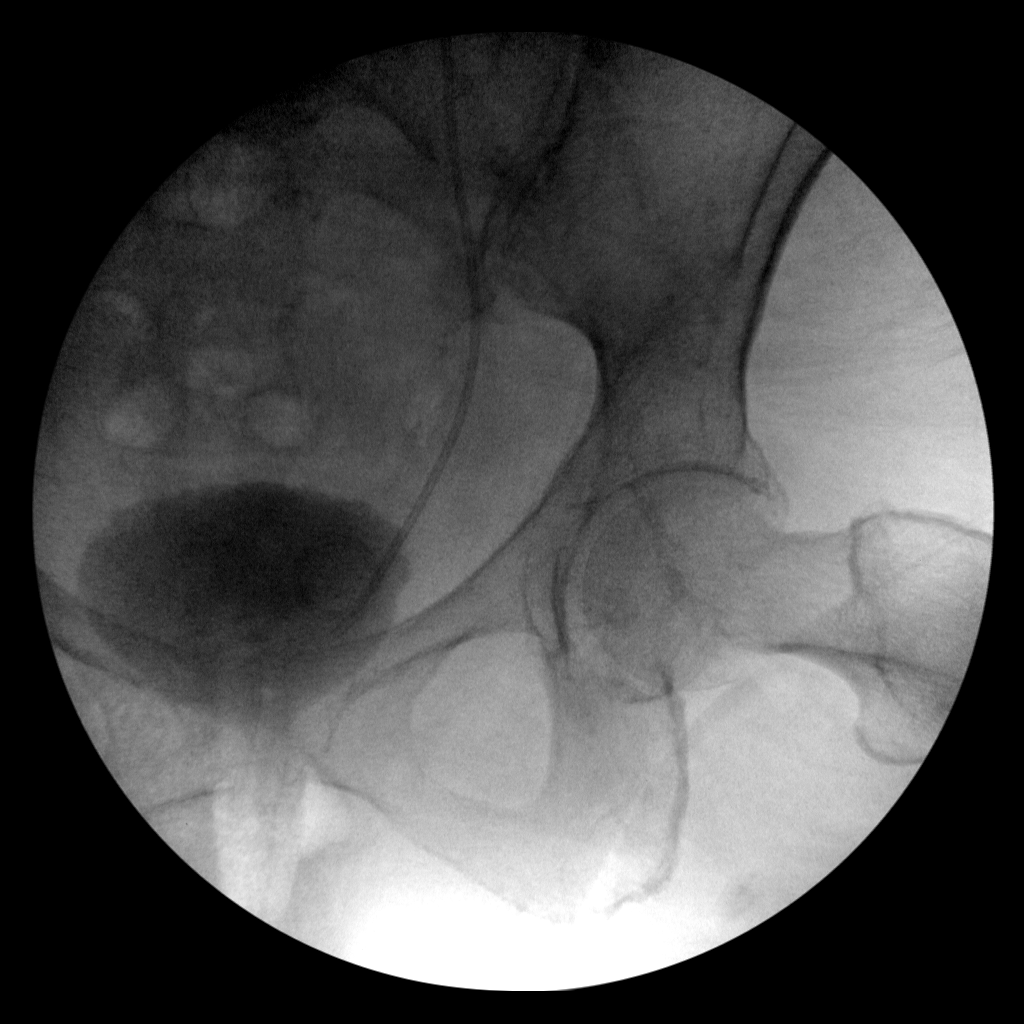

[4 of 4 positions shown; findings below may reference images not displayed]

FINDINGS: Multiple images are submitted from a left retrograde pyelogram. No
visible filling defect. Final images demonstrate placement of left
ureteral stent.
IMPRESSION: As above.

## 2023-01-21 IMAGING — DX DG CHEST 1V PORT
1 series · 1 of 1 positions shown · non-contrast
Comparison: Same day.

CLINICAL DATA: PICC line placement.

EXAM:
PORTABLE CHEST 1 VIEW

[chest]
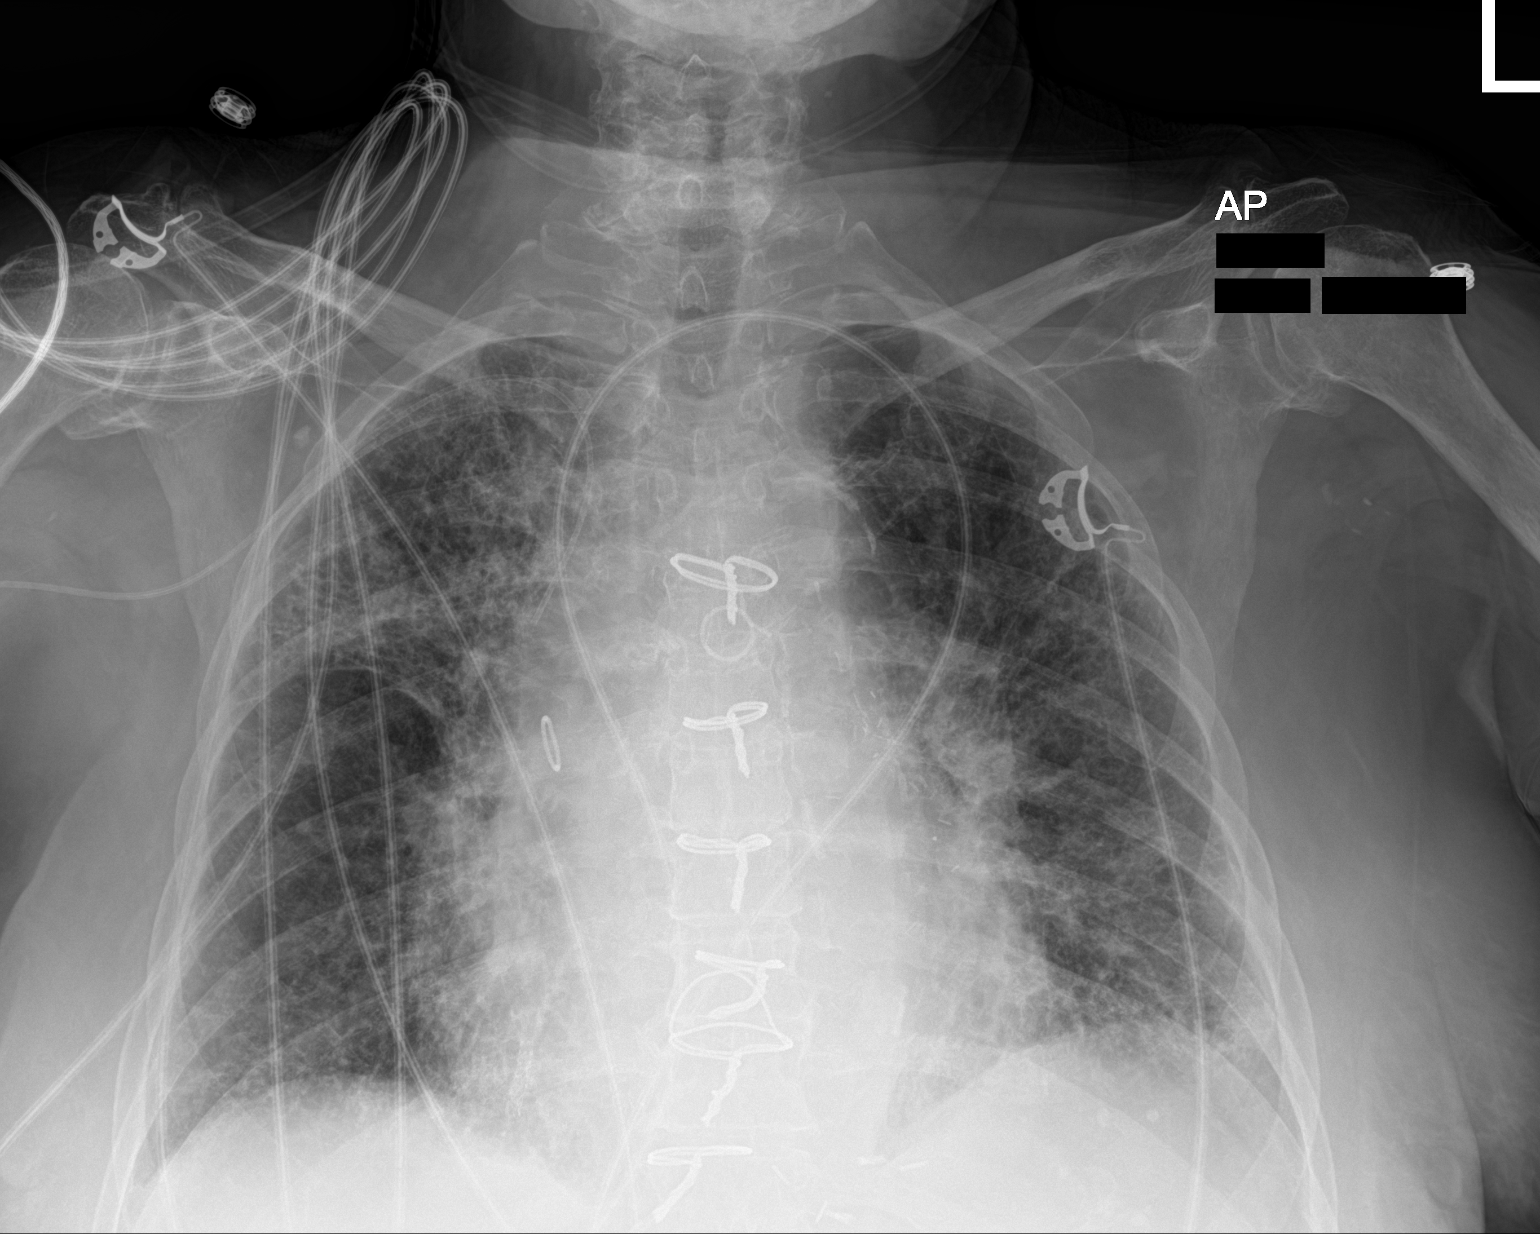

[1 of 1 positions shown; findings below may reference images not displayed]

FINDINGS: Stable cardiomediastinal silhouette. Interval placement of
right-sided PICC line with distal tip in expected position of the
SVC.
IMPRESSION: Interval placement of right-sided PICC line with distal tip in
expected position of the SVC.

## 2023-01-21 IMAGING — DX DG CHEST 1V PORT
1 series · 1 of 1 positions shown · non-contrast
Comparison: Chest radiograph 05/01/2021

CLINICAL DATA: Respiratory failure, pulmonary edema

EXAM:
PORTABLE CHEST 1 VIEW

[chest]
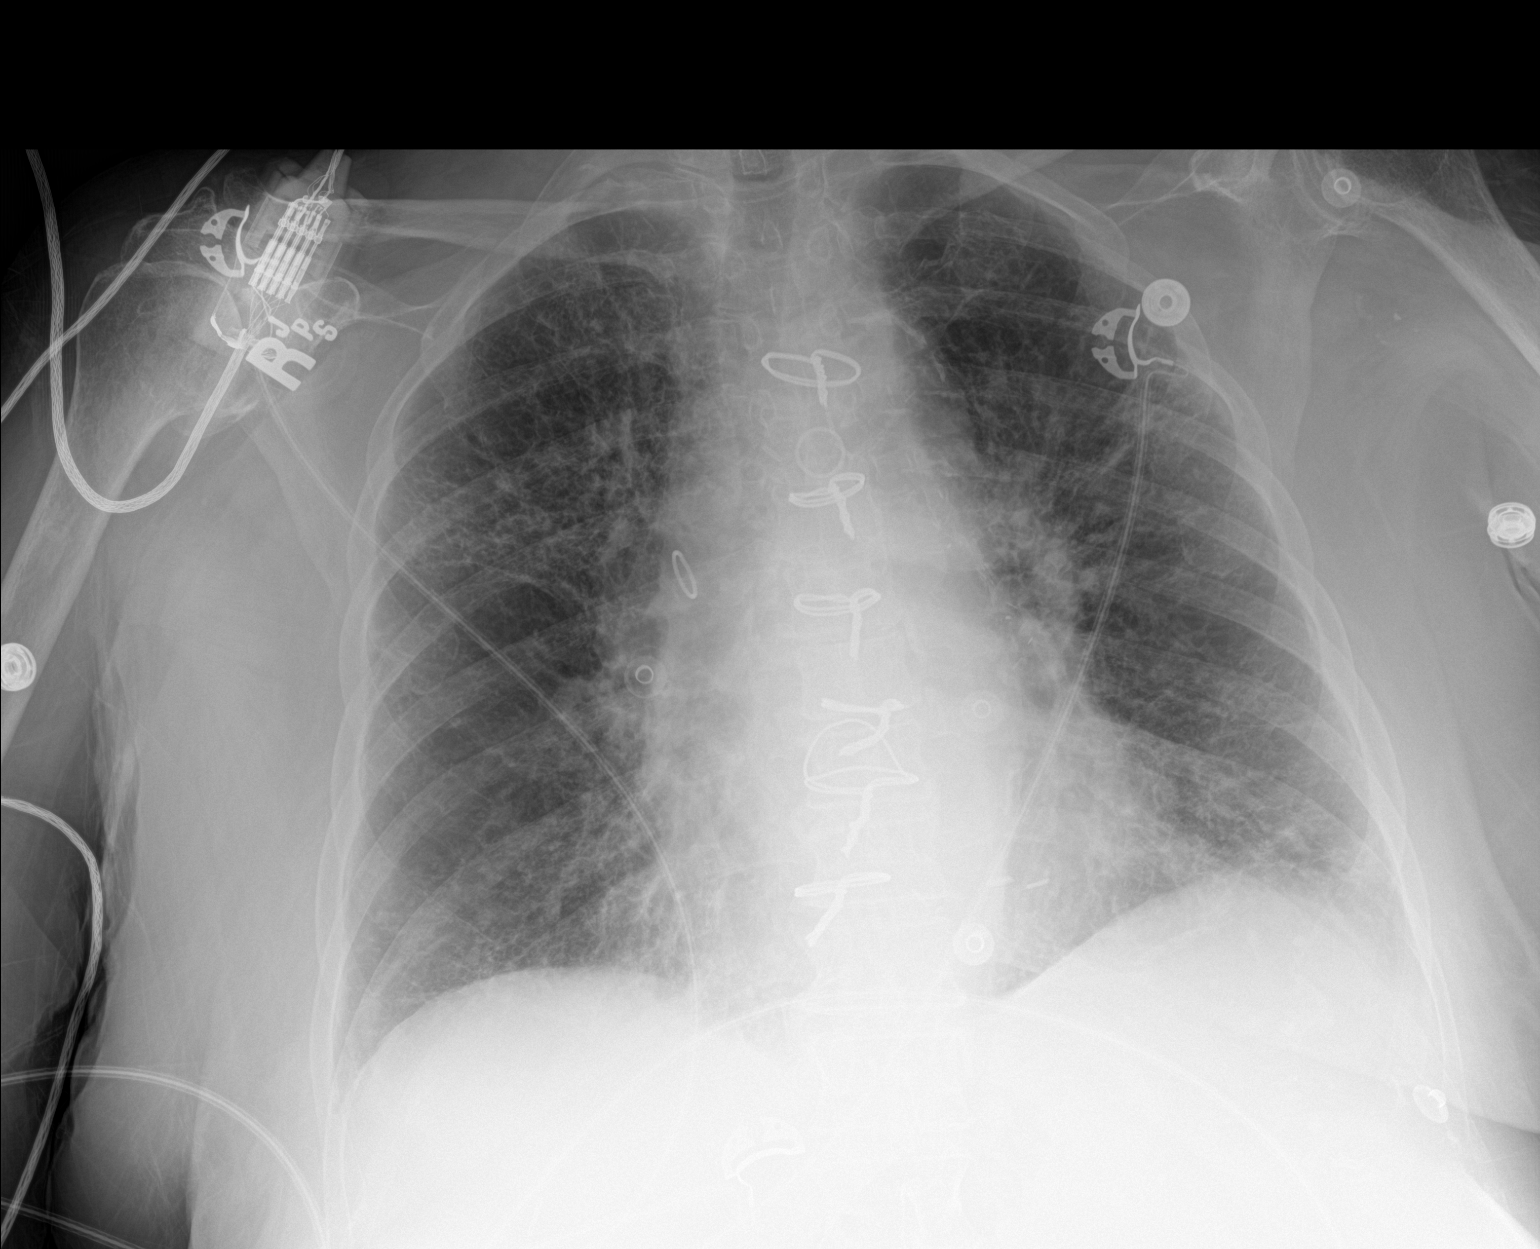

[1 of 1 positions shown; findings below may reference images not displayed]

FINDINGS: Median sternotomy wires and mediastinal surgical clips are stable.

The cardiomediastinal silhouette is stable.

Coarse reticular markings throughout both lungs are again seen,
unchanged. There is no new or worsening focal airspace disease.
There is no pleural effusion or pneumothorax.

The bones are stable.
IMPRESSION: Stable chest with no radiographic evidence of acute cardiopulmonary
process.

## 2023-01-22 IMAGING — MR MR HEAD W/O CM
6 of 11 series · 24 of 48 positions shown · non-contrast
Comparison: 05/01/2021 CT head and CTA head and neck, 11/12/2009
MRI

CLINICAL DATA: Stroke, follow-up

EXAM:
MRI HEAD WITHOUT CONTRAST
TECHNIQUE: Multiplanar, multiecho pulse sequences of the brain and surrounding
structures were obtained without intravenous contrast.

[Series 2: DWI · axial · 3.0mm · 0.94mm/px · z∈[-77,+75]mm · 7 of 103 slices shown (1 of 2)]
[im 1/103]
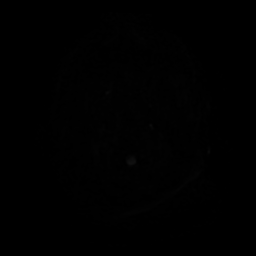
[im 18/103]
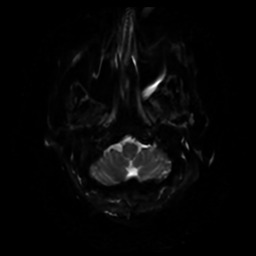
[im 35/103]
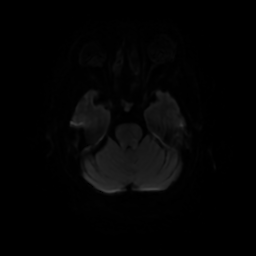
[im 52/103]
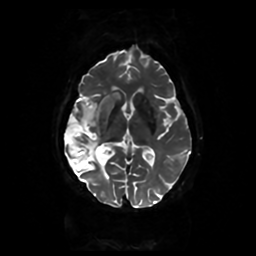
[im 69/103]
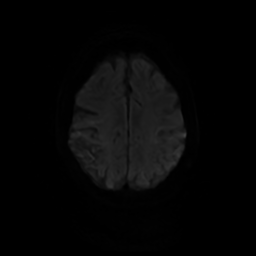
[im 86/103]
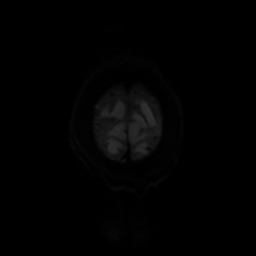
[im 103/103]
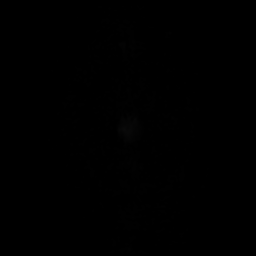

[Series 3: DWI · coronal · 4.0mm · 0.94mm/px · 5 of 72 slices shown (2 of 2)]
[im 1/72]
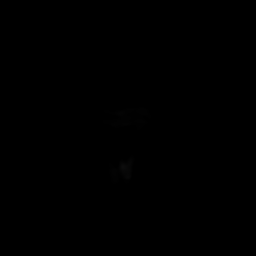
[im 18/72]
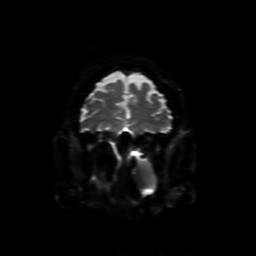
[im 36/72]
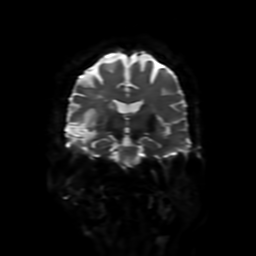
[im 54/72]
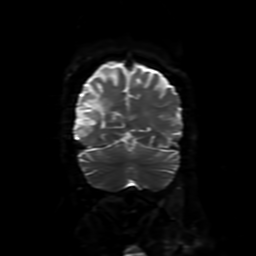
[im 72/72]
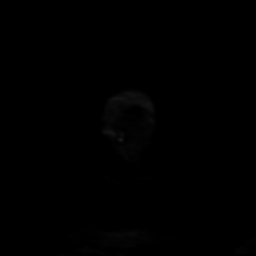

[Series 4: FLAIR · sagittal · 5.0mm · 0.23mm/px · 2 of 24 slices shown (1 of 2)]
[im 1/24]
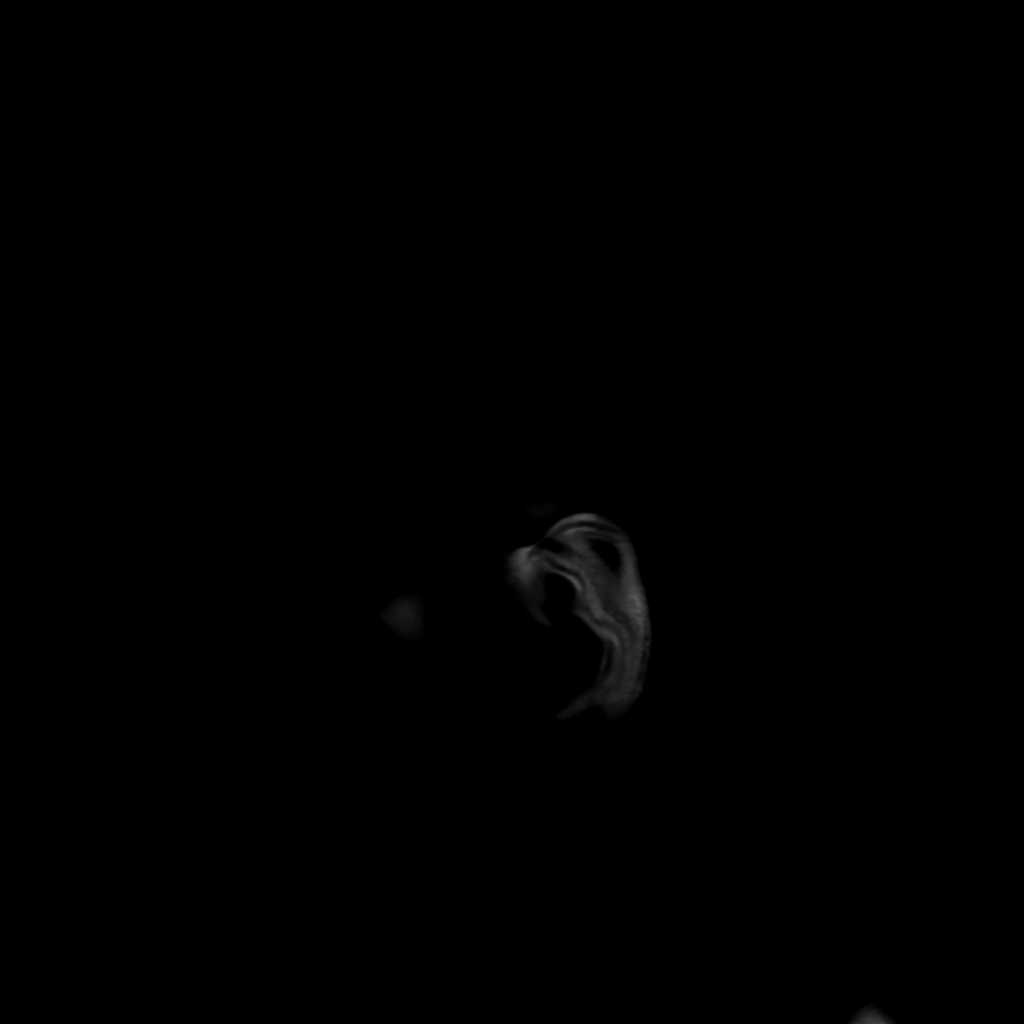
[im 24/24]
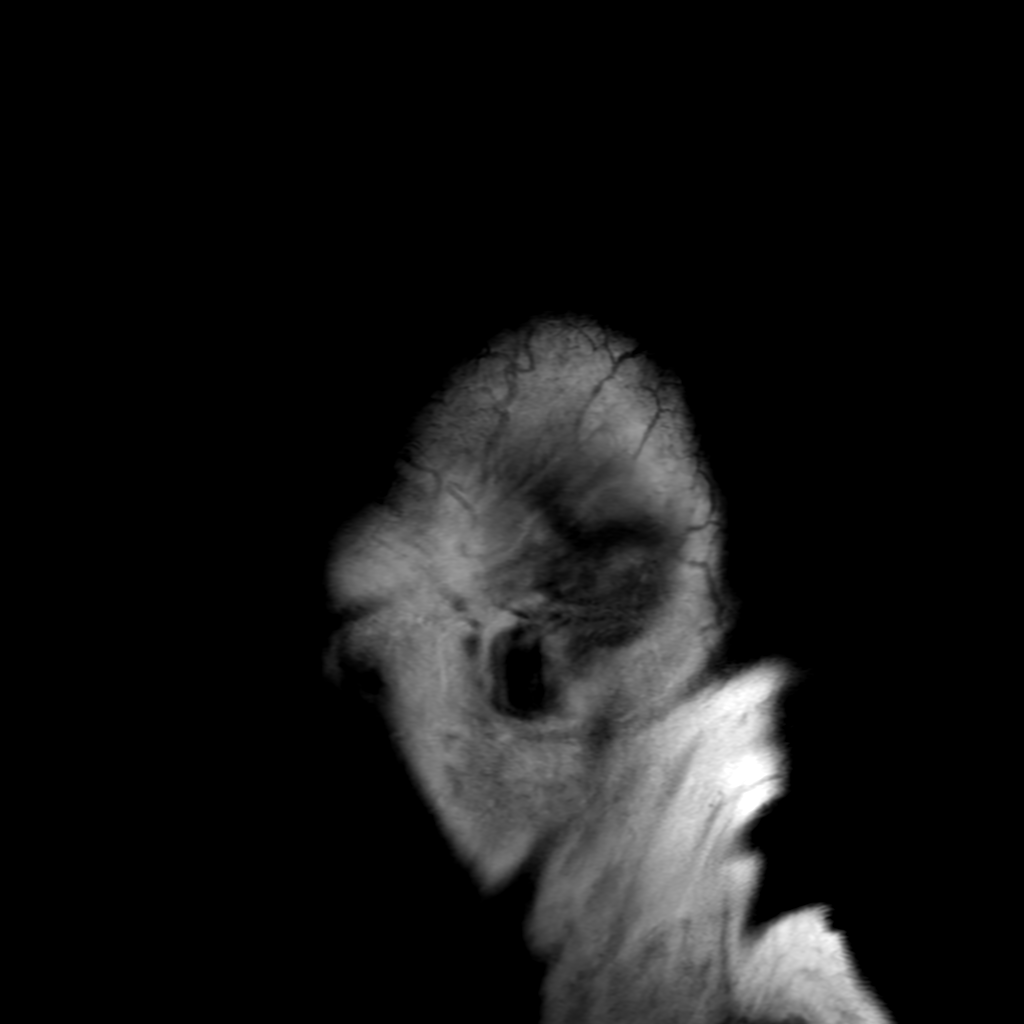

[Series 6: FLAIR · axial · 4.0mm · 0.45mm/px · z∈[-72,+77]mm · 3 of 35 slices shown (2 of 2)]
[im 1/35]
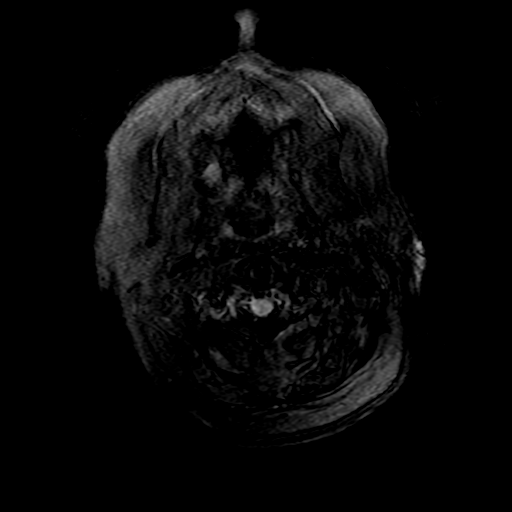
[im 18/35]
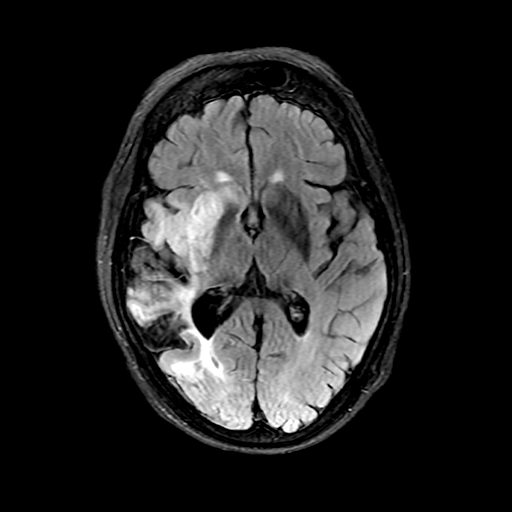
[im 35/35]
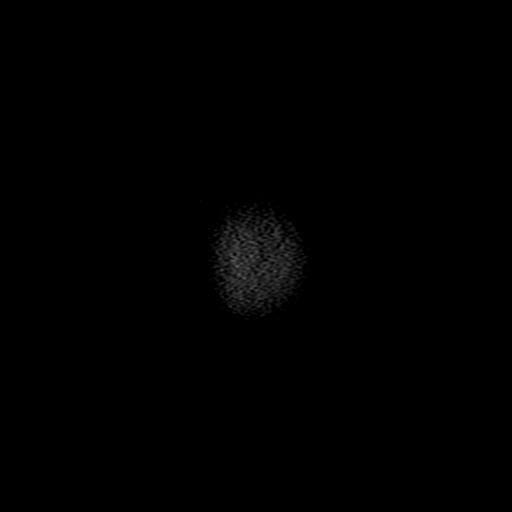

[Series 250: ADC · axial · 3.0mm · 0.94mm/px · z∈[-77,+75]mm · 4 of 49 slices shown (1 of 2)]
[im 1/49]
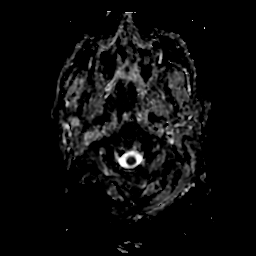
[im 17/49]
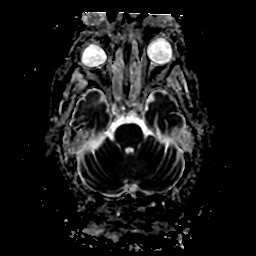
[im 33/49]
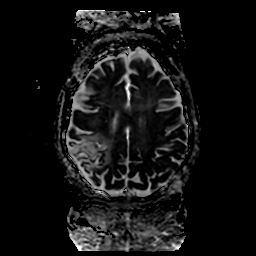
[im 49/49]
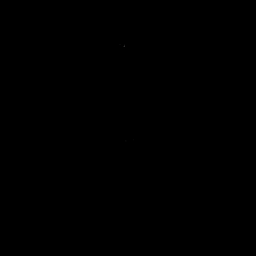

[Series 350: ADC · coronal · 4.0mm · 0.94mm/px · 3 of 36 slices shown (2 of 2)]
[im 1/36]
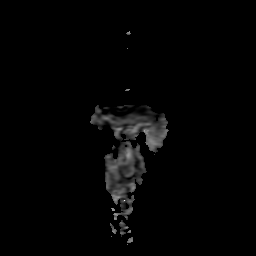
[im 18/36]
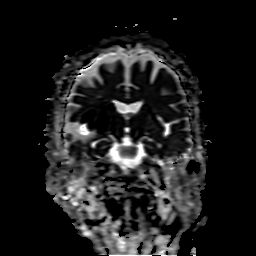
[im 36/36]
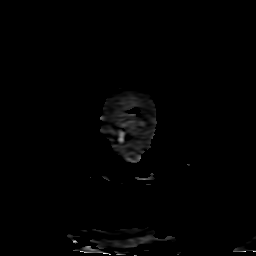

[24 of 48 positions shown; findings below may reference images not displayed]

FINDINGS: Brain: Confluent restricted diffusion in the insula, anterior
temporal lobe, and inferior frontal lobe, with additional less
severe restricted diffusion in the right caudate and lentiform
nucleus (series 2, images 22-31) consistent with known right MCA
occlusion. Additional non contiguous areas of restricted diffusion
in the right frontal lobe (series 2, image 32 and series 3, image
24), right temporal lobe (series 2, image 31 and series 3, image
17), and right parietal lobe (series 2, image 33 and series 3, image
14). These areas are associated with increased T2 signal, likely
cytotoxic edema, with greater T2 hyperintense signal in the caudate
and lentiform nucleus, with mild narrowing of the frontal horn of
the right lateral ventricle.

No definite acute hemorrhage. No mass or midline shift.
Encephalomalacia in the right posterior temporal and right parietal
lobes, consistent with remote right MCA territory infarct. No
hydrocephalus or extra-axial collection.

Vascular: Normal proximal flow voids. Poor visualization of more
distal right MCA flow voids.

Skull and upper cervical spine: Normal marrow signal.

Sinuses/Orbits: Status post bilateral lens replacements. Air-fluid
level in the left maxillary sinus. Mucosal thickening in the right
maxillary sinus, right-greater-than-left frontal sinus, bilateral
sphenoid sinuses, and throughout the ethmoid air cells.

Other: Fluid throughout the bilateral mastoid air cells.
IMPRESSION: Acute infarcts involving the insula, anterior temporal lobe, and
inferior frontal lobe, consistent with known recent right MCA
occlusion, now status post thrombectomy. Additional areas of
restricted diffusion in the caudate and lentiform nucleus appear
slightly less hyperintense on diffusion-weighted imaging, possibly
slightly less acute infarcts. These areas are all associated with
various degrees of edema, causing mild narrowing of the frontal horn
of the right lateral ventricle, but without significant mass effect
or midline shift. No evidence of hemorrhagic transformation.

## 2023-02-18 ENCOUNTER — Other Ambulatory Visit: Payer: Self-pay | Admitting: Internal Medicine

## 2023-02-22 ENCOUNTER — Other Ambulatory Visit: Payer: Self-pay | Admitting: Internal Medicine

## 2023-02-25 ENCOUNTER — Other Ambulatory Visit: Payer: Self-pay | Admitting: Internal Medicine

## 2023-02-25 MED ORDER — PREGABALIN 75 MG PO CAPS: 75.0000 mg | ORAL_CAPSULE | Freq: Three times a day (TID) | ORAL | 5 refills | Status: DC

## 2023-02-26 ENCOUNTER — Ambulatory Visit: Payer: Medicare HMO | Admitting: Allergy

## 2023-04-03 ENCOUNTER — Other Ambulatory Visit: Payer: Self-pay | Admitting: Internal Medicine

## 2023-04-15 ENCOUNTER — Other Ambulatory Visit: Payer: Self-pay | Admitting: Internal Medicine

## 2023-04-22 ENCOUNTER — Ambulatory Visit: Payer: Medicare HMO | Admitting: Internal Medicine

## 2023-04-22 ENCOUNTER — Encounter: Payer: Self-pay | Admitting: Internal Medicine

## 2023-04-22 VITALS — BP 120/78 | HR 66 | Temp 98.4°F | Resp 17 | Ht 62.0 in | Wt 124.6 lb

## 2023-04-22 DIAGNOSIS — E039 Hypothyroidism, unspecified: Secondary | ICD-10-CM

## 2023-04-22 DIAGNOSIS — N1832 Chronic kidney disease, stage 3b: Secondary | ICD-10-CM

## 2023-04-22 DIAGNOSIS — I10 Essential (primary) hypertension: Secondary | ICD-10-CM

## 2023-04-22 DIAGNOSIS — E119 Type 2 diabetes mellitus without complications: Secondary | ICD-10-CM

## 2023-04-22 NOTE — Assessment & Plan Note (Signed)
She is not on any BP medications and her BP remains controlled.

## 2023-04-22 NOTE — Progress Notes (Signed)
Office Visit  Subjective   Patient ID: Jenny Kelly   DOB: Feb 23, 1946   Age: 77 y.o.   MRN: [id number]   Chief Complaint Chief Complaint  Patient presents with   Follow-up     History of Present Illness The patient is a 77 year old Caucasian/White female who returns for a follow-up visit for her diet controlled diabetes. Since the last visit, there have been no problems. She remains on no medications; the diabetes is being managed by dietary intervention alone. She is not walking as much as they would like.. She specifically denies unexplained abdominal pain, nausea or vomiting and documented hypoglycemia. She has not been checking her blood sugars at home as they do not have a glucometer. She came in fasting today in anticipation of lab work. Her last HgBA1c was done 6 months ago and was 6.1%.  There are no complications of diabetic retinopathy or neuropathy.  She has CKD probably secondary to her HTN and diabetic nephropathy.  She has a history of cardiovascular diease as well and history of stroke but her diabetes has always been controlled. She had a dilated diabetic eye exam done by Dr. Caroll Rancher in 11/2022 but I do not have this record.  The patient is a 77 year old Caucasian/White female who returns for a regularly scheduled thyroid check.  She remains on levothyoxine daily.   She claims to have no symptoms suggestive of thyroid imbalance specifically denying  cold intolerance, heat intolerance, tremors, anxiety, unexplained weight changes, diarrhea, constipation, dry skin or insomnia.   The patient is a 77 year old Caucasian/White female who returns today for followup of her hypertension.  Since her last visit, her patient has not had any problems.  The patient has been checking her BP at home.   They states her systolic BP runs 409'W.  The patient's current medications include:  none.  She used to be on midodrine 10 mg oral tablet TID.  She has had problems with orthostatic  hypotension in the past but has not had a problems with this for years.  The patient denies any dizziness, lightheadness, chest pain, and shortness of breath.   The patient has a history of Stage IIIb CKD.  She was seeing the nephrologist once every 3-4 months with her last visit was in 2019 and she states that she is not going back.  Her creatinine at that time was 1.5.  Her GFR is around from 30-50 however she had recent labs this past year that showed her GFR was greater than 60.   We have noted that she went from Stage IIIa to Stage IIIb CKD over the past 1-2 years.  We did a urine study with albumin/creatinine ratio on her last visit and she was spilling protein.  We therefore started her on farxiga 10mg  daily. She denies any NSAIDS use.     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   Allergy 06/09/2021   Anemia    Anxiety    Arthritis    Asthma    Atrial fibrillation (HCC)    Blood transfusion without reported diagnosis    Cataract    CHF (congestive heart failure) (HCC)    Chronic kidney disease    Clotting disorder (HCC)    COPD (  chronic obstructive pulmonary disease) (HCC)    Dementia (HCC)    Depression    Diabetes mellitus without complication (HCC)    Dyspnea    GERD (gastroesophageal reflux disease)    Great toe pain, left 11/01/2021   Headache    Heart murmur    History of CVA with residual deficit 11/01/2021   History of kidney stones    Hyperlipidemia    Hypothyroidism    Myocardial infarction [facility])    Neuromuscular disorder (HCC)    tremors   Pneumonia due to COVID-19 virus    Postoperative wound infection 03/10/2014   Seizures (HCC)    Sleep apnea    Stroke (HCC)    Tension-type headache, not intractable 01/10/2016   Thyroid disease    Wide-complex  tachycardia      Allergies Allergies  Allergen Reactions   Ciprofloxacin Hives    Ask patient   Prochlorperazine Edisylate Other (See Comments)    Cant swallow Muscle cramping   Morphine Other (See Comments)    Confusion   Prednisone Other (See Comments)    unknown   Reduced Iso-Alpha Acids Complex Other (See Comments)    unknown   Statins Other (See Comments)    Muscle weakness    Sulfa Antibiotics Swelling   Azithromycin Rash   Latex Rash     Medications  Current Outpatient Medications:    albuterol (VENTOLIN HFA) 108 (90 Base) MCG/ACT inhaler, Inhale into the lungs., Disp: , Rfl:    aspirin 81 MG EC tablet, Take 1 tablet (81 mg total) by mouth daily. Swallow whole., Disp: 30 tablet, Rfl: 0   Carbinoxamine Maleate (RYVENT) 6 MG TABS, Take 1 tablet (6 mg total) by mouth 2 (two) times daily as needed., Disp: 60 tablet, Rfl: 5   donepezil (ARICEPT) 10 MG tablet, TAKE 1 TABLET BY MOUTH EVERY DAY IN THE EVENING, Disp: 90 tablet, Rfl: 1   FARXIGA 10 MG TABS tablet, TAKE 1 TABLET BY MOUTH ONCE DAILY BEFORE BREAKFAST, Disp: 30 tablet, Rfl: 0   guaiFENesin (MUCINEX) 600 MG 12 hr tablet, Take 600 mg by mouth daily as needed for cough or to loosen phlegm., Disp: , Rfl:    ipratropium (ATROVENT) 0.03 % nasal spray, Place 2 sprays into both nostrils every 12 (twelve) hours. (Patient not taking: Reported on 11/27/2022), Disp: 30 mL, Rfl: 12   levocetirizine (XYZAL) 5 MG tablet, Take one tablet once daily, Disp: 30 tablet, Rfl: 5   levothyroxine (SYNTHROID) 88 MCG tablet, Take 1 tablet by mouth once daily, Disp: 90 tablet, Rfl: 0   loperamide (IMODIUM) 2 MG capsule, TAKE 1 CAPSULE BY MOUTH 4 TIMES DAILY AS NEEDED FOR DIARRHEA OR LOOSE STOOLS, Disp: , Rfl:    nitroGLYCERIN (NITROSTAT) 0.4 MG SL tablet, Place 0.4 mg under the tongue every 5 (five) minutes as needed., Disp: , Rfl:    Nystatin (GERHARDT'S BUTT CREAM) CREA, Apply 1 Application topically 3 (three) times daily., Disp: 1 each, Rfl:  0   Olopatadine-Mometasone (RYALTRIS) 665-25 MCG/ACT SUSP, Place 2 sprays into both nostrils in the morning and at bedtime., Disp: 29 g, Rfl: 5   omeprazole (PRILOSEC) 40 MG capsule, TAKE 1 CAPSULE BY MOUTH TWICE A DAY BEFORE A MEAL, Disp: 180 capsule, Rfl: 1   pregabalin (LYRICA) 75 MG capsule, Take 1 capsule (75 mg total) by mouth 3 (three) times daily., Disp: 90 capsule, Rfl: 5   Probiotic Product (PROBIOTIC PO), Take 15 Billion Cells by mouth daily., Disp: , Rfl:  Propylene Glycol (SYSTANE COMPLETE OP), Apply 1 drop to eye as needed (Burning/Irritation)., Disp: , Rfl:    Rivaroxaban (XARELTO) 15 MG TABS tablet, Take 1 tablet (15 mg total) by mouth daily., Disp: 90 tablet, Rfl: 3   topiramate (TOPAMAX) 200 MG tablet, Take 1 tablet by mouth twice daily, Disp: 180 tablet, Rfl: 0   triamcinolone cream (KENALOG) 0.1 %, Apply 1 Application topically 2 (two) times daily., Disp: 80 g, Rfl: 0   Review of Systems Review of Systems  Constitutional:  Negative for chills, fever, malaise/fatigue and weight loss.  Eyes:  Negative for blurred vision and double vision.  Respiratory:  Negative for cough and shortness of breath.   Cardiovascular:  Negative for chest pain, palpitations and leg swelling.  Gastrointestinal:  Positive for constipation. Negative for abdominal pain, diarrhea, nausea and vomiting.  Genitourinary:  Negative for frequency.  Musculoskeletal:  Negative for myalgias.  Skin:  Negative for itching and rash.  Neurological:  Positive for headaches. Negative for dizziness and weakness.  Endo/Heme/Allergies:  Negative for polydipsia.       Objective:    Vitals BP 120/78   Pulse 66   Temp 98.4 F (36.9 C)   Resp 17   Ht 5\' 2"  (1.575 m)   Wt 124 lb 9.6 oz (56.5 kg)   SpO2 92%   BMI 22.79 kg/m    Physical Examination Physical Exam Constitutional:      Appearance: Normal appearance. She is not ill-appearing.  Cardiovascular:     Rate and Rhythm: Normal rate and regular  rhythm.     Pulses: Normal pulses.     Heart sounds: No murmur heard.    No friction rub. No gallop.  Pulmonary:     Effort: Pulmonary effort is normal. No respiratory distress.     Breath sounds: No wheezing, rhonchi or rales.  Abdominal:     General: Bowel sounds are normal. There is no distension.     Palpations: Abdomen is soft.     Tenderness: There is no abdominal tenderness.  Musculoskeletal:     Right lower leg: No edema.     Left lower leg: No edema.  Skin:    General: Skin is warm and dry.     Findings: No rash.  Neurological:     General: No focal deficit present.     Mental Status: She is alert and oriented to person, place, and time.  Psychiatric:        Mood and Affect: Mood normal.        Behavior: Behavior normal.        Assessment & Plan:   Diet-controlled diabetes mellitus (HCC) She has a history of diabetes associated with CAD and cerebrovascular disease but she controls her diabetes with diet and is not on any medications.  She also has  diabetic nephropathy.  I will check a HgBA1c today.  Stage 3b chronic kidney disease (HCC) She was started on farxiga about 6 months ago.  I am going to repeat a urine study on her today.  She is to avoid NSAIDS.  Essential hypertension She is not on any BP medications and her BP remains controlled.  Hypothyroidism We will check her TFT's today.  She seems euthyroid without any complaints today.    Return in about 3 months (around 07/21/2023).   Crist Fat, MD

## 2023-04-22 NOTE — Assessment & Plan Note (Signed)
She was started on farxiga about 6 months ago.  I am going to repeat a urine study on her today.  She is to avoid NSAIDS.

## 2023-04-22 NOTE — Assessment & Plan Note (Signed)
We will check her TFT's today.  She seems euthyroid without any complaints today.

## 2023-04-22 NOTE — Assessment & Plan Note (Addendum)
She has a history of diabetes associated with CAD and cerebrovascular disease but she controls her diabetes with diet and is not on any medications.  She also has  diabetic nephropathy.  I will check a HgBA1c today.

## 2023-04-23 LAB — CMP14 + ANION GAP
ALT: 5 [IU]/L (ref 0–32)
AST: 14 [IU]/L (ref 0–40)
Albumin: 3.5 g/dL — ABNORMAL LOW (ref 3.8–4.8)
Alkaline Phosphatase: 93 [IU]/L (ref 44–121)
Anion Gap: 14 mmol/L (ref 10.0–18.0)
BUN/Creatinine Ratio: 11 — ABNORMAL LOW (ref 12–28)
BUN: 11 mg/dL (ref 8–27)
Bilirubin Total: <0.2 mg/dL (ref 0.0–1.2)
CO2: 22 mmol/L (ref 20–29)
Calcium: 9 mg/dL (ref 8.7–10.3)
Chloride: 105 mmol/L (ref 96–106)
Creatinine, Ser: 0.97 mg/dL (ref 0.57–1.00)
Globulin, Total: 3.7 g/dL (ref 1.5–4.5)
Glucose: 142 mg/dL — ABNORMAL HIGH (ref 70–99)
Potassium: 3.7 mmol/L (ref 3.5–5.2)
Sodium: 141 mmol/L (ref 134–144)
Total Protein: 7.2 g/dL (ref 6.0–8.5)
eGFR: 60 mL/min/{1.73_m2} (ref >59)

## 2023-04-23 LAB — TSH: TSH: 10.9 u[IU]/mL — ABNORMAL HIGH (ref 0.450–4.500)

## 2023-04-23 LAB — HEMOGLOBIN A1C
Est. average glucose Bld gHb Est-mCnc: 137 mg/dL
Hgb A1c MFr Bld: 6.4 % — ABNORMAL HIGH (ref 4.8–5.6)

## 2023-04-23 LAB — T4, FREE: Free T4: 1.05 ng/dL (ref 0.82–1.77)

## 2023-04-29 ENCOUNTER — Other Ambulatory Visit: Payer: Self-pay | Admitting: Internal Medicine

## 2023-05-01 ENCOUNTER — Ambulatory Visit: Payer: Medicare HMO | Admitting: Internal Medicine

## 2023-05-01 DIAGNOSIS — E1159 Type 2 diabetes mellitus with other circulatory complications: Secondary | ICD-10-CM

## 2023-05-01 LAB — MICROALBUMIN / CREATININE URINE RATIO

## 2023-05-01 LAB — SPECIMEN STATUS REPORT

## 2023-05-01 NOTE — Progress Notes (Signed)
 Nurse visit Urine test

## 2023-05-02 LAB — MICROALBUMIN / CREATININE URINE RATIO
Creatinine, Urine: 67.4 mg/dL
Microalb/Creat Ratio: 27 mg/g{creat} (ref 0–29)
Microalbumin, Urine: 18 ug/mL

## 2023-05-09 ENCOUNTER — Other Ambulatory Visit: Payer: Self-pay | Admitting: Internal Medicine

## 2023-05-09 ENCOUNTER — Other Ambulatory Visit: Payer: Self-pay

## 2023-05-09 DIAGNOSIS — E039 Hypothyroidism, unspecified: Secondary | ICD-10-CM

## 2023-05-09 MED ORDER — LEVOTHYROXINE SODIUM 25 MCG PO TABS: 100.0000 ug | ORAL_TABLET | Freq: Every day | ORAL | Status: DC

## 2023-05-09 NOTE — Progress Notes (Signed)
Her diabetes is controlled. Increase her levothryoxine to po daily.  Patient's husband aware of results

## 2023-05-09 NOTE — Progress Notes (Signed)
Rx Change. 

## 2023-05-24 DIAGNOSIS — Z79899 Other long term (current) drug therapy: Secondary | ICD-10-CM | POA: Diagnosis not present

## 2023-05-24 DIAGNOSIS — Z79891 Long term (current) use of opiate analgesic: Secondary | ICD-10-CM | POA: Diagnosis not present

## 2023-05-24 DIAGNOSIS — I959 Hypotension, unspecified: Secondary | ICD-10-CM | POA: Diagnosis not present

## 2023-05-24 DIAGNOSIS — R531 Weakness: Secondary | ICD-10-CM | POA: Diagnosis not present

## 2023-05-24 DIAGNOSIS — R0902 Hypoxemia: Secondary | ICD-10-CM | POA: Diagnosis not present

## 2023-05-24 DIAGNOSIS — J189 Pneumonia, unspecified organism: Secondary | ICD-10-CM | POA: Diagnosis not present

## 2023-05-24 DIAGNOSIS — R5383 Other fatigue: Secondary | ICD-10-CM | POA: Diagnosis not present

## 2023-05-24 DIAGNOSIS — R739 Hyperglycemia, unspecified: Secondary | ICD-10-CM | POA: Diagnosis not present

## 2023-05-24 DIAGNOSIS — I110 Hypertensive heart disease with heart failure: Secondary | ICD-10-CM | POA: Diagnosis not present

## 2023-05-24 DIAGNOSIS — R231 Pallor: Secondary | ICD-10-CM | POA: Diagnosis not present

## 2023-05-24 DIAGNOSIS — Z7901 Long term (current) use of anticoagulants: Secondary | ICD-10-CM | POA: Diagnosis not present

## 2023-05-24 DIAGNOSIS — I509 Heart failure, unspecified: Secondary | ICD-10-CM | POA: Diagnosis not present

## 2023-05-24 DIAGNOSIS — I2510 Atherosclerotic heart disease of native coronary artery without angina pectoris: Secondary | ICD-10-CM | POA: Diagnosis not present

## 2023-05-24 DIAGNOSIS — N390 Urinary tract infection, site not specified: Secondary | ICD-10-CM | POA: Diagnosis not present

## 2023-05-29 ENCOUNTER — Encounter: Payer: Self-pay | Admitting: Internal Medicine

## 2023-05-29 ENCOUNTER — Ambulatory Visit: Payer: Medicare HMO | Admitting: Internal Medicine

## 2023-05-29 VITALS — BP 114/62 | HR 72 | Temp 98.7°F | Resp 18 | Ht 62.0 in | Wt 117.4 lb

## 2023-05-29 DIAGNOSIS — R300 Dysuria: Secondary | ICD-10-CM | POA: Insufficient documentation

## 2023-05-29 DIAGNOSIS — J189 Pneumonia, unspecified organism: Secondary | ICD-10-CM | POA: Insufficient documentation

## 2023-05-29 DIAGNOSIS — M109 Gout, unspecified: Secondary | ICD-10-CM | POA: Insufficient documentation

## 2023-05-29 MED ORDER — FLUCONAZOLE 150 MG PO TABS
150.0000 mg | ORAL_TABLET | Freq: Once | ORAL | 0 refills | Status: AC
Start: 2023-05-29 — End: 2023-05-29

## 2023-05-29 MED ORDER — METHYLPREDNISOLONE 4 MG PO TBPK: ORAL_TABLET | ORAL | 0 refills | Status: DC

## 2023-05-29 NOTE — Assessment & Plan Note (Signed)
 Her urine culture was contaminated at [facility].  I am going to try to do another urine collection and urine culture.  Continue her current antibiotics.

## 2023-05-29 NOTE — Progress Notes (Unsigned)
 Office Visit  Subjective   Patient ID: Jenny Kelly   DOB: 09/19/1945   Age: 78 y.o.   MRN: [id number]   Chief Complaint Chief Complaint  Patient presents with   Follow-up     History of Present Illness The patient is a 78 yo female who comes in today for a hospital ER followup.  She was seen at [facility] on 05/24/2023 where she presented with problems of fatigue and low energy.  Jenny Kelly tells me that this started about a week before.  EMS took her FSBS when they got there and it was 270.  She was brought into the ER where they did a lab workup, viral respiratory panel and xrays.  Lab workup showed a possible UTI where a urine culture was sent.  A pneumonia was seen on CXR and she was given antibiotics and discharged home on cefdinir and azithromycin.  Her vitals were stable and she was sent home.  Her urine showed a mixed contaminant.  Today, she complains of continued dysuria but no flank pain, fevers, chills, urinary frequency or hematuria.  She has some vaginal itching.  There is no SOB, wheezing, nasuea, vomting, or diarrhea.  She does not have an appetite.  She is drinking fluids well.     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   History of  kidney stones    Hyperlipidemia    Hypothyroidism    Myocardial infarction [facility])    Neuromuscular disorder (HCC)    tremors   Pneumonia due to COVID-19 virus    Postoperative wound infection 03/10/2014   Seizures (HCC)    Sleep apnea    Stroke (HCC)    Tension-type headache, not intractable 01/10/2016   Thyroid  disease    Wide-complex tachycardia      Allergies Allergies  Allergen Reactions   Ciprofloxacin Hives    Ask patient   Prochlorperazine Edisylate Other (See Comments)    Cant swallow Muscle cramping   Morphine  Other (See Comments)    Confusion   Prednisone  Other (See Comments)    unknown   Reduced Iso-Alpha Acids Complex Other (See Comments)    unknown   Statins Other (See Comments)    Muscle weakness    Sulfa Antibiotics Swelling   Azithromycin Rash   Latex Rash     Medications  Current Outpatient Medications:    fluconazole  (DIFLUCAN ) 150 MG tablet, Take 1 tablet (150 mg total) by mouth once for 1 dose., Disp: 1 tablet, Rfl: 0   albuterol  (VENTOLIN  HFA) 108 (90 Base) MCG/ACT inhaler, Inhale into the lungs., Disp: ,  Rfl:    aspirin  81 MG EC tablet, Take 1 tablet (81 mg total) by mouth daily. Swallow whole., Disp: 30 tablet, Rfl: 0   Carbinoxamine  Maleate (RYVENT ) 6 MG TABS, Take 1 tablet (6 mg total) by mouth 2 (two) times daily as needed., Disp: 60 tablet, Rfl: 5   donepezil  (ARICEPT ) 10 MG tablet, TAKE 1 TABLET BY MOUTH EVERY DAY IN THE EVENING, Disp: 90 tablet, Rfl: 1   FARXIGA  10 MG TABS tablet, TAKE 1 TABLET BY MOUTH ONCE DAILY BEFORE BREAKFAST, Disp: 30 tablet, Rfl: 0   guaiFENesin  (MUCINEX ) 600 MG 12 hr tablet, Take 600 mg by mouth daily as needed for cough or to loosen phlegm., Disp: , Rfl:    ipratropium (ATROVENT ) 0.03 % nasal spray, Place 2 sprays into both nostrils every 12 (twelve) hours. (Patient not taking: Reported on 11/27/2022), Disp: 30 mL, Rfl: 12   levocetirizine (XYZAL ) 5 MG tablet, Take one tablet once daily, Disp: 30 tablet, Rfl: 5    loperamide  (IMODIUM ) 2 MG capsule, TAKE 1 CAPSULE BY MOUTH 4 TIMES DAILY AS NEEDED FOR DIARRHEA OR LOOSE STOOLS, Disp: , Rfl:    nitroGLYCERIN  (NITROSTAT ) 0.4 MG SL tablet, Place 0.4 mg under the tongue every 5 (five) minutes as needed., Disp: , Rfl:    Nystatin (GERHARDT'S BUTT CREAM) CREA, Apply 1 Application topically 3 (three) times daily., Disp: 1 each, Rfl: 0   Olopatadine-Mometasone (RYALTRIS ) 665-25 MCG/ACT SUSP, Place 2 sprays into both nostrils in the morning and at bedtime., Disp: 29 g, Rfl: 5   omeprazole  (PRILOSEC) 40 MG capsule, TAKE 1 CAPSULE BY MOUTH TWICE A DAY BEFORE A MEAL, Disp: 180 capsule, Rfl: 1   pregabalin  (LYRICA ) 75 MG capsule, Take 1 capsule (75 mg total) by mouth 3 (three) times daily., Disp: 90 capsule, Rfl: 5   Probiotic Product (PROBIOTIC PO), Take 15 Billion Cells by mouth daily., Disp: , Rfl:    Propylene Glycol (SYSTANE COMPLETE OP), Apply 1 drop to eye as needed (Burning/Irritation)., Disp: , Rfl:    Rivaroxaban  (XARELTO ) 15 MG TABS tablet, Take 1 tablet (15 mg total) by mouth daily., Disp: 90 tablet, Rfl: 3   topiramate  (TOPAMAX ) 200 MG tablet, Take 1 tablet by mouth twice daily, Disp: 180 tablet, Rfl: 0   triamcinolone  cream (KENALOG ) 0.1 %, Apply 1 Application topically 2 (two) times daily., Disp: 80 g, Rfl: 0  Current Facility-Administered Medications:    levothyroxine  (SYNTHROID ) tablet 100 mcg, 100 mcg, Oral, Q0600, Van Eyk, Enna Warwick, MD   Review of Systems Review of Systems  Constitutional:  Negative for chills and fever.  Respiratory:  Positive for cough and sputum production. Negative for shortness of breath and wheezing.   Cardiovascular:  Negative for chest pain.  Gastrointestinal:  Negative for abdominal pain, constipation, diarrhea, nausea and vomiting.  Skin:  Negative for rash.  Neurological:  Positive for weakness and headaches. Negative for dizziness.       Objective:    Vitals BP 114/62   Pulse 72   Temp 98.7 F (37.1 C)   Resp 18    Ht 5' 2 (1.575 m)   Wt 117 lb 6.4 oz (53.3 kg)   SpO2 98%   BMI 21.47 kg/m    Physical Examination Physical Exam Constitutional:      Appearance: Normal appearance. She is not ill-appearing.  Cardiovascular:     Rate and Rhythm: Normal rate and regular rhythm.     Pulses: Normal pulses.     Heart sounds: No murmur heard.  No friction rub. No gallop.  Pulmonary:     Effort: Pulmonary effort is normal. No respiratory distress.     Breath sounds: No wheezing, rhonchi or rales.  Abdominal:     General: Bowel sounds are normal. There is no distension.     Palpations: Abdomen is soft.     Tenderness: There is no abdominal tenderness.  Musculoskeletal:     Right lower leg: No edema.     Left lower leg: No edema.  Skin:    General: Skin is warm and dry.     Findings: No rash.  Neurological:     Mental Status: She is alert.        Assessment & Plan:   Dysuria Her urine culture was contaminated at [facility].  I am going to try to do another urine collection and urine culture.  Continue her current antibiotics.  Pneumonia of right lower lobe due to infectious organism I hear rales in her RLL.  I want her to continue on mucinex  and continue on her current antibiotics.  Her vitals are current doing well.  Continue to drink fluids.  Acute gout involving toe of right foot She has gout in her right big toe.  I am going to place her on a medrol  dose pak.    No follow-ups on file.   Selinda Fleeta Finger, MD

## 2023-05-29 NOTE — Assessment & Plan Note (Signed)
 I hear rales in her RLL.  I want her to continue on mucinex  and continue on her current antibiotics.  Her vitals are current doing well.  Continue to drink fluids.

## 2023-05-29 NOTE — Assessment & Plan Note (Signed)
She has gout in her right big toe.  I am going to place her on a medrol dose pak.

## 2023-05-30 DIAGNOSIS — R300 Dysuria: Secondary | ICD-10-CM | POA: Diagnosis not present

## 2023-05-30 LAB — POCT URINALYSIS DIPSTICK
Bilirubin, UA: NEGATIVE
Blood, UA: NEGATIVE
Glucose, UA: POSITIVE — AB
Ketones, UA: NEGATIVE
Nitrite, UA: NEGATIVE
Protein, UA: POSITIVE — AB
Spec Grav, UA: 1.015 (ref 1.010–1.025)
Urobilinogen, UA: 0.2 U/dL
pH, UA: 5.5 (ref 5.0–8.0)

## 2023-06-04 ENCOUNTER — Other Ambulatory Visit: Payer: Self-pay

## 2023-06-04 NOTE — Progress Notes (Signed)
Did we start her on an antibiotic and send her urine for culture?  Start her on cipro 500mg  po BID x 7 d  Called and made pt husband aware

## 2023-06-05 ENCOUNTER — Other Ambulatory Visit: Payer: Self-pay

## 2023-06-05 DIAGNOSIS — R300 Dysuria: Secondary | ICD-10-CM

## 2023-06-05 LAB — URINE CULTURE

## 2023-06-05 MED ORDER — FLUCONAZOLE 150 MG PO TABS
150.0000 mg | ORAL_TABLET | Freq: Every day | ORAL | 0 refills | Status: AC
Start: 2023-06-05 — End: 2023-06-08

## 2023-06-05 NOTE — Progress Notes (Signed)
New Rx

## 2023-06-07 ENCOUNTER — Other Ambulatory Visit: Payer: Self-pay | Admitting: Internal Medicine

## 2023-06-10 ENCOUNTER — Ambulatory Visit: Payer: Medicare HMO

## 2023-06-10 VITALS — BP 110/62 | Ht 62.0 in | Wt 117.0 lb

## 2023-06-10 DIAGNOSIS — R300 Dysuria: Secondary | ICD-10-CM

## 2023-06-10 LAB — POCT URINALYSIS DIPSTICK
Appearance: NORMAL
Bilirubin, UA: NEGATIVE
Blood, UA: NEGATIVE
Glucose, UA: POSITIVE — AB
Ketones, UA: NEGATIVE
Nitrite, UA: NEGATIVE
Protein, UA: POSITIVE — AB
Spec Grav, UA: 1.015 (ref 1.010–1.025)
Urobilinogen, UA: 0.2 U/dL
pH, UA: 7 (ref 5.0–8.0)

## 2023-06-10 NOTE — Progress Notes (Signed)
Nurse Visit- Urinalysis

## 2023-07-06 ENCOUNTER — Other Ambulatory Visit: Payer: Self-pay | Admitting: Internal Medicine

## 2023-07-11 ENCOUNTER — Other Ambulatory Visit: Payer: Self-pay | Admitting: Internal Medicine

## 2023-07-15 ENCOUNTER — Other Ambulatory Visit: Payer: Self-pay | Admitting: Internal Medicine

## 2023-07-17 ENCOUNTER — Other Ambulatory Visit: Payer: Self-pay | Admitting: Internal Medicine

## 2023-07-17 ENCOUNTER — Other Ambulatory Visit: Payer: Self-pay

## 2023-07-17 DIAGNOSIS — E039 Hypothyroidism, unspecified: Secondary | ICD-10-CM

## 2023-07-17 MED ORDER — LEVOTHYROXINE SODIUM 100 MCG PO TABS: 100.0000 ug | ORAL_TABLET | Freq: Every day | ORAL | 11 refills | Status: DC

## 2023-07-17 NOTE — Progress Notes (Signed)
 Rx

## 2023-07-18 ENCOUNTER — Encounter: Payer: Self-pay | Admitting: Internal Medicine

## 2023-07-18 ENCOUNTER — Ambulatory Visit: Payer: Medicare HMO | Admitting: Internal Medicine

## 2023-07-18 VITALS — BP 116/70 | HR 84 | Temp 98.6°F | Resp 17 | Ht 62.0 in | Wt 117.0 lb

## 2023-07-18 DIAGNOSIS — N1832 Chronic kidney disease, stage 3b: Secondary | ICD-10-CM | POA: Diagnosis not present

## 2023-07-18 DIAGNOSIS — E039 Hypothyroidism, unspecified: Secondary | ICD-10-CM | POA: Diagnosis not present

## 2023-07-18 DIAGNOSIS — I10 Essential (primary) hypertension: Secondary | ICD-10-CM

## 2023-07-18 NOTE — Assessment & Plan Note (Signed)
 Her BP is currently controlled.  She is not on any medications and we will continue to follow.  I have asked her to followup with Dr. Kirtland Bouchard.

## 2023-07-18 NOTE — Progress Notes (Signed)
 Office Visit  Subjective   Patient ID: Jenny Kelly   DOB: 06/27/1945   Age: 78 y.o.   MRN: [id number]   Chief Complaint Chief Complaint  Patient presents with   Follow-up     History of Present Illness The patient is a 78 year old Caucasian/White female who returns for a regularly scheduled thyroid check.  On her last visit 3 months ago, her TSH was elevated and I increased her from levothyoxine daily to daily.  However, Vonna Kotyk tells me that they just started the new dose yesterday.  She claims to have no symptoms suggestive of thyroid imbalance specifically denying  cold intolerance, heat intolerance, tremors, anxiety, unexplained weight changes, diarrhea, constipation, dry skin or insomnia.   The patient is a 79 year old Caucasian/White female who returns today for followup of her hypertension.  Since her last visit, her patient has not had any problems.  The patient has been checking her BP at home.   They states her systolic BP runs 409'W.  The patient's current medications include:  none.  She used to be on midodrine 10 mg oral tablet TID.  She has had problems with orthostatic hypotension in the past but has not had a problems with this for years.  The patient denies any dizziness, lightheadness, blurred vision, chest pain, shortness of breath, generalized weakness or edema.    The patient has a history of Stage IIIb CKD.  She was seeing the nephrologist once every 3-4 months with her last visit was in 2019 and she states that she is not going back.  Her creatinine at that time was 1.5.  Her GFR is around from 30-50 however she had recent labs this past year that showed her GFR was greater than 60.   We have noted that she went from Stage IIIa to Stage IIIb CKD over the past 1-2 years.  We did a urine study with albumin/creatinine ratio on her last visit and she was spilling protein.  We therefore started her on farxiga 10mg  daily. She denies any NSAIDS use.       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   Allergy 06/09/2021   Anemia    Anxiety    Arthritis    Asthma    Atrial fibrillation (HCC)    Blood transfusion without reported diagnosis    Cataract    CHF (congestive heart failure) (HCC)    Chronic kidney disease    Clotting disorder (HCC)    COPD (chronic obstructive pulmonary disease) (HCC)    Dementia (HCC)    Depression    Diabetes mellitus without complication (HCC)    Dyspnea    GERD (gastroesophageal reflux disease)    Great toe pain, left 11/01/2021   Headache    Heart murmur    History of CVA with residual deficit 11/01/2021   History of kidney stones    Hyperlipidemia    Hypothyroidism    Myocardial infarction [facility])    Neuromuscular disorder (HCC)    tremors   Pneumonia due to COVID-19 virus    Postoperative wound infection 03/10/2014   Seizures (HCC)    Sleep apnea    Stroke (HCC)    Tension-type headache, not intractable 01/10/2016   Thyroid disease  Wide-complex tachycardia      Allergies Allergies  Allergen Reactions   Ciprofloxacin Hives    Ask patient   Prochlorperazine Edisylate Other (See Comments)    Cant swallow Muscle cramping   Morphine Other (See Comments)    Confusion   Prednisone Other (See Comments)    unknown   Reduced Iso-Alpha Acids Complex Other (See Comments)    unknown   Statins Other (See Comments)    Muscle weakness    Sulfa Antibiotics Swelling   Azithromycin Rash   Latex Rash     Medications  Current Outpatient Medications:    albuterol (VENTOLIN HFA) 108 (90 Base) MCG/ACT inhaler, Inhale into the lungs., Disp: , Rfl:    aspirin 81 MG EC tablet, Take 1 tablet (81 mg total) by mouth daily. Swallow whole., Disp: 30 tablet, Rfl: 0   Carbinoxamine Maleate (RYVENT) 6 MG TABS,  Take 1 tablet (6 mg total) by mouth 2 (two) times daily as needed., Disp: 60 tablet, Rfl: 5   donepezil (ARICEPT) 10 MG tablet, TAKE 1 TABLET BY MOUTH EVERY DAY IN THE EVENING, Disp: 90 tablet, Rfl: 1   FARXIGA 10 MG TABS tablet, TAKE 1 TABLET BY MOUTH ONCE DAILY BEFORE BREAKFAST, Disp: 30 tablet, Rfl: 0   guaiFENesin (MUCINEX) 600 MG 12 hr tablet, Take 600 mg by mouth daily as needed for cough or to loosen phlegm., Disp: , Rfl:    ipratropium (ATROVENT) 0.03 % nasal spray, Place 2 sprays into both nostrils every 12 (twelve) hours. (Patient not taking: Reported on 11/27/2022), Disp: 30 mL, Rfl: 12   levocetirizine (XYZAL) 5 MG tablet, Take one tablet once daily, Disp: 30 tablet, Rfl: 5   levothyroxine (SYNTHROID) 100 MCG tablet, Take 1 tablet (100 mcg total) by mouth daily., Disp: 30 tablet, Rfl: 11   loperamide (IMODIUM) 2 MG capsule, TAKE 1 CAPSULE BY MOUTH 4 TIMES DAILY AS NEEDED FOR DIARRHEA OR LOOSE STOOLS, Disp: , Rfl:    methylPREDNISolone (MEDROL DOSEPAK) 4 MG TBPK tablet, Use as directed, Disp: 21 tablet, Rfl: 0   nitroGLYCERIN (NITROSTAT) 0.4 MG SL tablet, Place 0.4 mg under the tongue every 5 (five) minutes as needed., Disp: , Rfl:    Nystatin (GERHARDT'S BUTT CREAM) CREA, Apply 1 Application topically 3 (three) times daily., Disp: 1 each, Rfl: 0   Olopatadine-Mometasone (RYALTRIS) 665-25 MCG/ACT SUSP, Place 2 sprays into both nostrils in the morning and at bedtime., Disp: 29 g, Rfl: 5   omeprazole (PRILOSEC) 40 MG capsule, TAKE 1 CAPSULE BY MOUTH TWICE A DAY BEFORE A MEAL, Disp: 180 capsule, Rfl: 1   pregabalin (LYRICA) 75 MG capsule, Take 1 capsule (75 mg total) by mouth 3 (three) times daily., Disp: 90 capsule, Rfl: 5   Probiotic Product (PROBIOTIC PO), Take 15 Billion Cells by mouth daily., Disp: , Rfl:    Propylene Glycol (SYSTANE COMPLETE OP), Apply 1 drop to eye as needed (Burning/Irritation)., Disp: , Rfl:    Rivaroxaban (XARELTO) 15 MG TABS tablet, Take 1 tablet (15 mg total) by  mouth daily., Disp: 90 tablet, Rfl: 3   topiramate (TOPAMAX) 200 MG tablet, Take 1 tablet by mouth twice daily, Disp: 180 tablet, Rfl: 0   triamcinolone cream (KENALOG) 0.1 %, Apply 1 Application topically 2 (two) times daily., Disp: 80 g, Rfl: 0   Review of Systems Review of Systems  Constitutional:  Negative for chills and fever.  Eyes:  Negative for blurred vision and double vision.  Respiratory:  Negative for shortness of breath.  Cardiovascular:  Negative for chest pain, palpitations and leg swelling.  Gastrointestinal:  Negative for abdominal pain, constipation, diarrhea, nausea and vomiting.  Genitourinary:  Negative for frequency.  Musculoskeletal:  Negative for myalgias.  Skin:  Negative for rash.  Neurological:  Negative for dizziness, weakness and headaches.  Endo/Heme/Allergies:  Negative for polydipsia.       Objective:    Vitals BP 116/70   Pulse 84   Temp 98.6 F (37 C)   Resp 17   Ht 5\' 2"  (1.575 m)   Wt 117 lb (53.1 kg)   SpO2 96%   BMI 21.40 kg/m    Physical Examination Physical Exam Constitutional:      Appearance: Normal appearance. She is not ill-appearing.  Cardiovascular:     Rate and Rhythm: Normal rate and regular rhythm.     Pulses: Normal pulses.     Heart sounds: No murmur heard.    No friction rub. No gallop.  Pulmonary:     Effort: Pulmonary effort is normal. No respiratory distress.     Breath sounds: No wheezing, rhonchi or rales.  Abdominal:     General: Bowel sounds are normal. There is no distension.     Palpations: Abdomen is soft.     Tenderness: There is no abdominal tenderness.  Musculoskeletal:     Right lower leg: No edema.     Left lower leg: No edema.  Skin:    General: Skin is warm and dry.     Findings: No rash.  Neurological:     Mental Status: She is alert.        Assessment & Plan:   Essential hypertension Her BP is currently controlled.  She is not on any medications and we will continue to follow.  I  have asked her to followup with Dr. Kirtland Bouchard.  Hypothyroidism She just started the levothyroxine just this week.  We will wait until her next visit to recheck her TFT's.  Stage 3b chronic kidney disease (HCC) Her CKD has been doing well and her GFR has improved >60.  She remains on farxiga at this time.  She is to avoid all NSAIDS.    Return in about 3 months (around 10/18/2023) for annual.   Crist Fat, MD

## 2023-07-18 NOTE — Assessment & Plan Note (Signed)
 Her CKD has been doing well and her GFR has improved >60.  She remains on farxiga at this time.  She is to avoid all NSAIDS.

## 2023-07-18 NOTE — Assessment & Plan Note (Signed)
 She just started the levothyroxine just this week.  We will wait until her next visit to recheck her TFT's.

## 2023-08-09 ENCOUNTER — Ambulatory Visit: Attending: Cardiology | Admitting: Cardiology

## 2023-08-09 ENCOUNTER — Encounter: Payer: Self-pay | Admitting: Cardiology

## 2023-08-09 VITALS — BP 100/60 | HR 102 | Ht 62.0 in | Wt 113.0 lb

## 2023-08-09 DIAGNOSIS — I25810 Atherosclerosis of coronary artery bypass graft(s) without angina pectoris: Secondary | ICD-10-CM | POA: Diagnosis not present

## 2023-08-09 DIAGNOSIS — E1121 Type 2 diabetes mellitus with diabetic nephropathy: Secondary | ICD-10-CM | POA: Diagnosis not present

## 2023-08-09 DIAGNOSIS — I480 Paroxysmal atrial fibrillation: Secondary | ICD-10-CM | POA: Diagnosis not present

## 2023-08-09 DIAGNOSIS — I5032 Chronic diastolic (congestive) heart failure: Secondary | ICD-10-CM | POA: Diagnosis not present

## 2023-08-09 DIAGNOSIS — I10 Essential (primary) hypertension: Secondary | ICD-10-CM | POA: Diagnosis not present

## 2023-08-09 NOTE — Patient Instructions (Signed)

## 2023-08-09 NOTE — Progress Notes (Signed)
 Cardiology Office Note:    Date:  08/09/2023   ID:  Jenny Kelly, DOB 1945-09-19, MRN [id number]  PCP:  Wayne Haines, MD  Cardiologist:  Ralene Burger, MD    Referring MD: Mitcheal Amy, Reymundo Caulk, MD   Chief Complaint  Patient presents with   Follow-up    History of Present Illness:    Jenny Kelly is a 78 y.o. female past medical history significant for cerebrovascular disease status for multiple strokes, coronary disease status post coronary bypass graft done in 2015, essential hypertension, chronic diastolic congestive heart failure, chronic venous insufficiency, diabetes, pulmonary fibrosis, sleep apnea, COPD, GERD, hypothyroidism, hyperlipidemia intolerant to statin. Comes today to months for follow-up overall she says she is doing well however she is lives a very sedentary lifestyle.  No shortness of breath no chest pain no tightness no squeezing no pressure no burning in the chest.  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   History of kidney stones    Hyperlipidemia    Hypothyroidism    Myocardial infarction (HCC)    Neuromuscular disorder (HCC)    tremors   Pneumonia due to COVID-19 virus    Postoperative wound infection 03/10/2014   Seizures  (HCC)    Sleep apnea    Stroke (HCC)    Tension-type headache, not intractable 01/10/2016   Thyroid  disease    Wide-complex tachycardia     Past Surgical History:  Procedure Laterality Date   ABDOMINAL HYSTERECTOMY     APPENDECTOMY     bladder tack     CHOLECYSTECTOMY     CORONARY ARTERY BYPASS GRAFT     CYSTOSCOPY W/ URETERAL STENT PLACEMENT Left 05/01/2021   Procedure: CYSTOSCOPY WITH RETROGRADE PYELOGRAM/URETERAL STENT PLACEMENT;  Surgeon: Adelbert Homans, MD;  Location: [facility] OR;  Service: Urology;  Laterality: Left;   CYSTOSCOPY/URETEROSCOPY/HOLMIUM LASER/STENT PLACEMENT Left 08/09/2021   Procedure: CYSTOSCOPY/URETEROSCOPY/HOLMIUM LASER/STENT EXCHANGE;  Surgeon: Adelbert Homans, MD;  Location: [facility] ORS;  Service: Urology;  Laterality: Left;   ESOPHAGOGASTRODUODENOSCOPY ENDOSCOPY     EYE SURGERY     FRACTURE SURGERY Right    3rd finger   IR FLUORO GUIDED NEEDLE PLC ASPIRATION/INJECTION LOC  12/27/2021   IR FLUORO GUIDED NEEDLE PLC ASPIRATION/INJECTION LOC  01/01/2022   JOINT REPLACEMENT Bilateral    knees   TONSILLECTOMY AND ADENOIDECTOMY     TUBAL LIGATION      Current Medications: Current Meds  Medication Sig   albuterol  (VENTOLIN  HFA) 108 (90 Base) MCG/ACT inhaler Inhale into the lungs.   aspirin  81 MG EC tablet Take 1 tablet (81 mg total) by mouth  daily. Swallow whole.   Carbinoxamine  Maleate (RYVENT ) 6 MG TABS Take 1 tablet (6 mg total) by mouth 2 (two) times daily as needed.   donepezil  (ARICEPT ) 10 MG tablet TAKE 1 TABLET BY MOUTH EVERY DAY IN THE EVENING   FARXIGA  10 MG TABS tablet TAKE 1 TABLET BY MOUTH ONCE DAILY BEFORE BREAKFAST   guaiFENesin  (MUCINEX ) 600 MG 12 hr tablet Take 600 mg by mouth daily as needed for cough or to loosen phlegm.   ipratropium (ATROVENT ) 0.03 % nasal spray Place 2 sprays into both nostrils every 12 (twelve) hours.   levocetirizine (XYZAL ) 5 MG tablet Take one tablet once daily   levothyroxine  (SYNTHROID ) 100 MCG tablet Take 1  tablet (100 mcg total) by mouth daily.   loperamide  (IMODIUM ) 2 MG capsule TAKE 1 CAPSULE BY MOUTH 4 TIMES DAILY AS NEEDED FOR DIARRHEA OR LOOSE STOOLS   nitroGLYCERIN  (NITROSTAT ) 0.4 MG SL tablet Place 0.4 mg under the tongue every 5 (five) minutes as needed.   Nystatin (GERHARDT'S BUTT CREAM) CREA Apply 1 Application topically 3 (three) times daily.   Olopatadine-Mometasone (RYALTRIS ) 665-25 MCG/ACT SUSP Place 2 sprays into both nostrils in the morning and at bedtime.   omeprazole  (PRILOSEC) 40 MG capsule TAKE 1 CAPSULE BY MOUTH TWICE A DAY BEFORE A MEAL   pregabalin  (LYRICA ) 75 MG capsule Take 1 capsule (75 mg total) by mouth 3 (three) times daily.   Probiotic Product (PROBIOTIC PO) Take 15 Billion Cells by mouth daily.   Propylene Glycol (SYSTANE COMPLETE OP) Apply 1 drop to eye as needed (Burning/Irritation).   Rivaroxaban  (XARELTO ) 15 MG TABS tablet Take 1 tablet (15 mg total) by mouth daily.   topiramate  (TOPAMAX ) 200 MG tablet Take 1 tablet by mouth twice daily   triamcinolone  cream (KENALOG ) 0.1 % Apply 1 Application topically 2 (two) times daily.     Allergies:   Ciprofloxacin, Prochlorperazine edisylate, Morphine , Prednisone , Reduced iso-alpha acids complex, Statins, Sulfa antibiotics, Azithromycin, and Latex   Social History   Socioeconomic History   Marital status: Married    Spouse name: Marijean Shouts   Number of children: 3   Years of education: Not on file   Highest education level: Not on file  Occupational History   Not on file  Tobacco Use   Smoking status: Former    Current packs/day: 0.00    Average packs/day: 0.5 packs/day for 30.0 years (15.0 ttl pk-yrs)    Types: Cigarettes    Start date: 06/27/1970    Quit date: 06/26/2000    Years since quitting: 23.1   Smokeless tobacco: Never  Vaping Use   Vaping status: Former  Substance and Sexual Activity   Alcohol  use: Not Currently   Drug use: Never   Sexual activity: Not on file  Other Topics Concern   Not on file   Social History Narrative   Not on file   Social Drivers of Health   Financial Resource Strain: Not on file  Food Insecurity: No Food Insecurity (06/17/2022)   Hunger Vital Sign    Worried About Running Out of Food in the Last Year: Never true    Ran Out of Food in the Last Year: Never true  Transportation Needs: No Transportation Needs (06/17/2022)   PRAPARE - Administrator, Civil Service (Medical): No    Lack of Transportation (Non-Medical): No  Physical Activity: Not on file  Stress: Not on file  Social Connections: Not on file     Family History: The patient's family  history includes Breast cancer in her mother; Heart attack in her mother; Heart disease in her mother and sister. ROS:   Please see the history of present illness.    All 14 point review of systems negative except as described per history of present illness  EKGs/Labs/Other Studies Reviewed:         Recent Labs: 10/15/2022: Hemoglobin 11.6; Platelets 334 04/22/2023: ALT 5; BUN 11; Creatinine, Ser 0.97; Potassium 3.7; Sodium 141; TSH 10.900  Recent Lipid Panel    Component Value Date/Time   CHOL 234 (H) 10/15/2022 1032   TRIG 166 (H) 10/15/2022 1032   HDL 53 10/15/2022 1032   CHOLHDL 4.4 10/15/2022 1032   CHOLHDL 6.2 05/03/2021 0538   VLDL 26 05/03/2021 0538   LDLCALC 151 (H) 10/15/2022 1032    Physical Exam:    VS:  BP 100/60   Pulse (!) 102   Ht 5\' 2"  (1.575 m)   Wt 113 lb (51.3 kg)   SpO2 90%   BMI 20.67 kg/m     Wt Readings from Last 3 Encounters:  08/09/23 113 lb (51.3 kg)  07/18/23 117 lb (53.1 kg)  06/10/23 117 lb (53.1 kg)     GEN:  Well nourished, well developed in no acute distress HEENT: Normal NECK: No JVD; No carotid bruits LYMPHATICS: No lymphadenopathy CARDIAC: RRR, no murmurs, no rubs, no gallops RESPIRATORY:  Clear to auscultation without rales, wheezing or rhonchi  ABDOMEN: Soft, non-tender, non-distended MUSCULOSKELETAL:  No edema; No deformity  SKIN:  Warm and dry LOWER EXTREMITIES: no swelling NEUROLOGIC:  Alert and oriented x 3 PSYCHIATRIC:  Normal affect   ASSESSMENT:    1. Essential hypertension   2. Chronic diastolic CHF (congestive heart failure) (HCC)   3. Coronary artery disease involving coronary bypass graft of native heart without angina pectoris   4. PAF (paroxysmal atrial fibrillation) (HCC)   5. Diabetic nephropathy associated with type 2 diabetes mellitus (HCC)    PLAN:    In order of problems listed above:  Essential hypertension blood pressure well-controlled actually slightly on lower side we will continue present management. Chronic diastolic congestive heart failure, seems to be compensated on physical exam. Coronary disease stable from that point review on appropriate guideline directed medical therapy that I will continue. Paroxysmal atrial fibrillation is tachycardic today we will do her EKG to confirm the rhythm   Medication Adjustments/Labs and Tests Ordered: Current medicines are reviewed at length with the patient today.  Concerns regarding medicines are outlined above.  Orders Placed This Encounter  Procedures   EKG 12-Lead   Medication changes: No orders of the defined types were placed in this encounter.   Signed, Manfred Seed, MD, [facility] 08/09/2023 3:29 PM    [facility]

## 2023-08-16 ENCOUNTER — Other Ambulatory Visit: Payer: Self-pay | Admitting: Internal Medicine

## 2023-09-04 ENCOUNTER — Other Ambulatory Visit: Payer: Self-pay | Admitting: Internal Medicine

## 2023-09-05 ENCOUNTER — Other Ambulatory Visit: Payer: Self-pay | Admitting: Internal Medicine

## 2023-09-05 ENCOUNTER — Other Ambulatory Visit: Payer: Self-pay

## 2023-09-05 MED ORDER — PREGABALIN 75 MG PO CAPS: 75.0000 mg | ORAL_CAPSULE | Freq: Three times a day (TID) | ORAL | 5 refills | Status: DC

## 2023-09-05 MED ORDER — TOPIRAMATE 200 MG PO TABS: 200.0000 mg | ORAL_TABLET | Freq: Two times a day (BID) | ORAL | 1 refills | Status: AC

## 2023-09-05 NOTE — Progress Notes (Signed)
 Rx Refill

## 2023-09-09 ENCOUNTER — Encounter: Payer: Self-pay | Admitting: Internal Medicine

## 2023-09-09 ENCOUNTER — Ambulatory Visit: Admitting: Internal Medicine

## 2023-09-09 VITALS — BP 116/64 | HR 89 | Temp 98.7°F | Resp 19 | Ht 62.0 in | Wt 114.2 lb

## 2023-09-09 DIAGNOSIS — J189 Pneumonia, unspecified organism: Secondary | ICD-10-CM | POA: Diagnosis not present

## 2023-09-09 DIAGNOSIS — R059 Cough, unspecified: Secondary | ICD-10-CM | POA: Diagnosis not present

## 2023-09-09 LAB — POC COVID19 BINAXNOW: SARS Coronavirus 2 Ag: NEGATIVE

## 2023-09-09 MED ORDER — DOXYCYCLINE MONOHYDRATE 100 MG PO CAPS
100.0000 mg | ORAL_CAPSULE | Freq: Two times a day (BID) | ORAL | 0 refills | Status: AC
Start: 2023-09-09 — End: ?

## 2023-09-09 NOTE — Progress Notes (Signed)
 Office Visit  Subjective   Patient ID: Jenny Kelly   DOB: January 01, 1946   Age: 78 y.o.   MRN: [id number]   Chief Complaint Chief Complaint  Patient presents with   Acute Visit    Cold symptoms since Friday 5/16     History of Present Illness Nyeli comes in today with complaints of cough productive dark yellow/black mucus that began about 3 days ago.  She denies any fevers, chills, sinus congestion, myalgias, no worsening headaches, SOB or wheezing.  The patient has tried mucinex .  She was tested for COVID-19 today and this negative.  She has had 2 COVID-19 vaccines.     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   History of kidney stones    Hyperlipidemia    Hypothyroidism    Myocardial infarction [facility])    Neuromuscular disorder (HCC)    tremors   Pneumonia due to COVID-19 virus    Postoperative wound infection 03/10/2014   Seizures (HCC)    Sleep apnea    Stroke (HCC)    Tension-type headache, not intractable 01/10/2016   Thyroid  disease    Wide-complex tachycardia      Allergies Allergies  Allergen Reactions   Ciprofloxacin Hives    Ask patient   Prochlorperazine Edisylate Other (See Comments)    Cant swallow Muscle  cramping   Morphine  Other (See Comments)    Confusion   Prednisone  Other (See Comments)    unknown   Reduced Iso-Alpha Acids Complex Other (See Comments)    unknown   Statins Other (See Comments)    Muscle weakness    Sulfa Antibiotics Swelling   Azithromycin Rash   Latex Rash     Medications  Current Outpatient Medications:    albuterol  (VENTOLIN  HFA) 108 (90 Base) MCG/ACT inhaler, Inhale into the lungs., Disp: , Rfl:    aspirin  81 MG EC tablet, Take 1 tablet (81 mg total) by mouth daily. Swallow whole., Disp: 30 tablet, Rfl: 0   Carbinoxamine  Maleate (RYVENT ) 6 MG TABS, Take 1 tablet (6 mg total) by mouth 2 (two) times daily as needed., Disp: 60 tablet, Rfl: 5   donepezil  (ARICEPT ) 10 MG tablet, TAKE 1 TABLET BY MOUTH EVERY DAY IN THE EVENING, Disp: 90 tablet, Rfl: 1   FARXIGA  10 MG TABS tablet, TAKE 1 TABLET BY MOUTH ONCE DAILY BEFORE BREAKFAST, Disp: 30 tablet, Rfl: 0   guaiFENesin  (MUCINEX ) 600 MG 12 hr tablet, Take 600 mg by mouth daily as needed for cough or to loosen phlegm., Disp: , Rfl:    ipratropium (ATROVENT ) 0.03 % nasal  spray, Place 2 sprays into both nostrils every 12 (twelve) hours., Disp: 30 mL, Rfl: 12   levocetirizine (XYZAL ) 5 MG tablet, Take one tablet once daily, Disp: 30 tablet, Rfl: 5   levothyroxine  (SYNTHROID ) 100 MCG tablet, Take 1 tablet (100 mcg total) by mouth daily., Disp: 30 tablet, Rfl: 11   loperamide  (IMODIUM ) 2 MG capsule, TAKE 1 CAPSULE BY MOUTH 4 TIMES DAILY AS NEEDED FOR DIARRHEA OR LOOSE STOOLS, Disp: , Rfl:    nitroGLYCERIN  (NITROSTAT ) 0.4 MG SL tablet, Place 0.4 mg under the tongue every 5 (five) minutes as needed., Disp: , Rfl:    Nystatin (GERHARDT'S BUTT CREAM) CREA, Apply 1 Application topically 3 (three) times daily., Disp: 1 each, Rfl: 0   Olopatadine-Mometasone (RYALTRIS ) 665-25 MCG/ACT SUSP, Place 2 sprays into both nostrils in the morning and at bedtime., Disp: 29 g, Rfl: 5   omeprazole  (PRILOSEC) 40 MG capsule, TAKE 1 CAPSULE BY  MOUTH TWICE A DAY BEFORE A MEAL, Disp: 180 capsule, Rfl: 1   pregabalin  (LYRICA ) 75 MG capsule, Take 1 capsule (75 mg total) by mouth 3 (three) times daily., Disp: 90 capsule, Rfl: 5   Probiotic Product (PROBIOTIC PO), Take 15 Billion Cells by mouth daily., Disp: , Rfl:    Propylene Glycol (SYSTANE COMPLETE OP), Apply 1 drop to eye as needed (Burning/Irritation)., Disp: , Rfl:    Rivaroxaban  (XARELTO ) 15 MG TABS tablet, Take 1 tablet (15 mg total) by mouth daily., Disp: 90 tablet, Rfl: 3   topiramate  (TOPAMAX ) 200 MG tablet, Take 1 tablet (200 mg total) by mouth 2 (two) times daily., Disp: 180 tablet, Rfl: 1   triamcinolone  cream (KENALOG ) 0.1 %, Apply 1 Application topically 2 (two) times daily., Disp: 80 g, Rfl: 0   Review of Systems Review of Systems  Constitutional:  Negative for chills and fever.  Respiratory:  Positive for cough and sputum production. Negative for hemoptysis, shortness of breath and wheezing.   Cardiovascular:  Negative for chest pain and leg swelling.  Gastrointestinal:  Positive for constipation. Negative for abdominal pain, diarrhea, nausea and vomiting.  Skin:  Negative for itching and rash.  Neurological:  Positive for headaches. Negative for dizziness and weakness.       Objective:    Vitals BP 116/64   Pulse 89   Temp 98.7 F (37.1 C)   Resp 19   Ht 5\' 2"  (1.575 m)   Wt 114 lb 3.2 oz (51.8 kg)   SpO2 90%   BMI 20.89 kg/m    Physical Examination Physical Exam Constitutional:      Appearance: Normal appearance. She is not ill-appearing.  Cardiovascular:     Rate and Rhythm: Normal rate and regular rhythm.     Pulses: Normal pulses.     Heart sounds: No murmur heard.    No friction rub. No gallop.  Pulmonary:     Effort: Pulmonary effort is normal. No respiratory distress.     Breath sounds: Rales present. No wheezing or rhonchi.     Comments: She rales in right lower lobe on auscultation. Abdominal:     General: Bowel sounds are normal. There  is no distension.     Palpations: Abdomen is soft.     Tenderness: There is no abdominal tenderness.  Musculoskeletal:     Right lower leg: No edema.     Left lower leg: No edema.  Skin:    General: Skin is warm and dry.     Findings: No rash.  Neurological:  Mental Status: She is alert.        Assessment & Plan:   Pneumonia due to infectious organism She has rales on exam.  I think she has pneumonia.  I am giong to get a CXR on her today.  We will start doxycyline.  She is to go the ER if she worsens.    No follow-ups on file.   Wayne Haines, MD

## 2023-09-09 NOTE — Assessment & Plan Note (Signed)
 She has rales on exam.  I think she has pneumonia.  I am giong to get a CXR on her today.  We will start doxycyline.  She is to go the ER if she worsens.

## 2023-09-10 ENCOUNTER — Other Ambulatory Visit: Payer: Self-pay | Admitting: Internal Medicine

## 2023-09-11 ENCOUNTER — Other Ambulatory Visit: Payer: Self-pay | Admitting: Internal Medicine

## 2023-09-12 ENCOUNTER — Other Ambulatory Visit: Payer: Self-pay | Admitting: Internal Medicine

## 2023-09-20 ENCOUNTER — Ambulatory Visit: Admitting: Internal Medicine

## 2023-09-20 VITALS — BP 116/68 | HR 111 | Temp 97.9°F | Resp 16 | Ht 62.0 in | Wt 111.1 lb

## 2023-09-20 DIAGNOSIS — J8410 Pulmonary fibrosis, unspecified: Secondary | ICD-10-CM

## 2023-09-20 DIAGNOSIS — R051 Acute cough: Secondary | ICD-10-CM | POA: Diagnosis not present

## 2023-09-20 NOTE — Assessment & Plan Note (Signed)
 Her CXR was noted and she has a history of COPD with pulmonary fibrosis and chronic respiratory failure.  Her antibiotic helped some.  We will obtain a CT scan of her chest and she may need an airway evaluation.  I want her to continue with her mucinex .  She is not having any fevers.

## 2023-09-20 NOTE — Progress Notes (Signed)
 Office Visit  Subjective   Patient ID: Jenny Kelly   DOB: 10-10-1945   Age: 78 y.o.   MRN: [id number]   Chief Complaint Chief Complaint  Patient presents with   Follow-up     History of Present Illness Jenny Kelly is a 78 yo female who returns with continued symptoms of cough.  I saw her 11 days ago where we did a CXR on 09/09/2023 and that shows chronic interstital and fibrotic changes in both lungs without a superimposed process or significant change since her CXR done in 04/2023.  I sent him home on doxycycline  and she has continue on mucinex .  I saw her 11 days ago where she had complaints of cough productive dark yellow/black mucus that began about 3 days prior.  She denied any fevers, chills, sinus congestion, myalgias, no worsening headaches, SOB or wheezing.   She was tested for COVID-19 and this negative. Over the interim, she states that the antibiotics did help and she is still coughing up dark yellow mucus.  Her cough is causing her to have a headache but she still has no fevers, chills, SOB, chest congestion, wheezing or other problems.       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   History of kidney stones     Hyperlipidemia    Hypothyroidism    Myocardial infarction [facility])    Neuromuscular disorder (HCC)    tremors   Pneumonia due to COVID-19 virus    Postoperative wound infection 03/10/2014   Seizures (HCC)    Sleep apnea    Stroke (HCC)    Tension-type headache, not intractable 01/10/2016   Thyroid  disease    Wide-complex tachycardia      Allergies Allergies  Allergen Reactions   Ciprofloxacin Hives    Ask patient   Prochlorperazine Edisylate Other (See Comments)    Cant swallow Muscle cramping   Morphine  Other (See Comments)    Confusion   Prednisone  Other (See Comments)    unknown   Reduced Iso-Alpha Acids Complex Other (See Comments)    unknown   Statins Other (See Comments)    Muscle weakness    Sulfa Antibiotics Swelling   Azithromycin Rash   Latex Rash     Medications  Current Outpatient Medications:    albuterol  (VENTOLIN  HFA) 108 (90 Base) MCG/ACT inhaler, Inhale into the lungs., Disp: , Rfl:    aspirin  81 MG EC tablet, Take 1 tablet (81 mg total) by mouth daily. Swallow whole., Disp: 30 tablet, Rfl: 0   Carbinoxamine  Maleate (RYVENT ) 6 MG TABS, Take 1 tablet (  6 mg total) by mouth 2 (two) times daily as needed., Disp: 60 tablet, Rfl: 5   donepezil  (ARICEPT ) 10 MG tablet, TAKE 1 TABLET BY MOUTH EVERY DAY IN THE EVENING, Disp: 90 tablet, Rfl: 1   doxycycline  (MONODOX ) 100 MG capsule, Take 1 capsule (100 mg total) by mouth 2 (two) times daily., Disp: 20 capsule, Rfl: 0   FARXIGA  10 MG TABS tablet, TAKE 1 TABLET BY MOUTH ONCE DAILY BEFORE BREAKFAST, Disp: 30 tablet, Rfl: 0   guaiFENesin  (MUCINEX ) 600 MG 12 hr tablet, Take 600 mg by mouth daily as needed for cough or to loosen phlegm., Disp: , Rfl:    ipratropium (ATROVENT ) 0.03 % nasal spray, Place 2 sprays into both nostrils every 12 (twelve) hours., Disp: 30 mL, Rfl: 12   levocetirizine (XYZAL ) 5 MG tablet, Take one tablet once daily, Disp: 30 tablet, Rfl: 5   levothyroxine  (SYNTHROID ) 100 MCG tablet, Take 1 tablet  (100 mcg total) by mouth daily., Disp: 30 tablet, Rfl: 11   loperamide  (IMODIUM ) 2 MG capsule, TAKE 1 CAPSULE BY MOUTH 4 TIMES DAILY AS NEEDED FOR DIARRHEA OR LOOSE STOOLS, Disp: , Rfl:    nitroGLYCERIN  (NITROSTAT ) 0.4 MG SL tablet, Place 0.4 mg under the tongue every 5 (five) minutes as needed., Disp: , Rfl:    Nystatin (GERHARDT'S BUTT CREAM) CREA, Apply 1 Application topically 3 (three) times daily., Disp: 1 each, Rfl: 0   Olopatadine-Mometasone (RYALTRIS ) 665-25 MCG/ACT SUSP, Place 2 sprays into both nostrils in the morning and at bedtime., Disp: 29 g, Rfl: 5   omeprazole  (PRILOSEC) 40 MG capsule, TAKE 1 CAPSULE BY MOUTH TWICE A DAY BEFORE A MEAL, Disp: 180 capsule, Rfl: 1   pregabalin  (LYRICA ) 75 MG capsule, Take 1 capsule (75 mg total) by mouth 3 (three) times daily., Disp: 90 capsule, Rfl: 5   Probiotic Product (PROBIOTIC PO), Take 15 Billion Cells by mouth daily., Disp: , Rfl:    Propylene Glycol (SYSTANE COMPLETE OP), Apply 1 drop to eye as needed (Burning/Irritation)., Disp: , Rfl:    Rivaroxaban  (XARELTO ) 15 MG TABS tablet, Take 1 tablet (15 mg total) by mouth daily., Disp: 90 tablet, Rfl: 3   topiramate  (TOPAMAX ) 200 MG tablet, Take 1 tablet (200 mg total) by mouth 2 (two) times daily., Disp: 180 tablet, Rfl: 1   triamcinolone  cream (KENALOG ) 0.1 %, Apply 1 Application topically 2 (two) times daily., Disp: 80 g, Rfl: 0   Review of Systems Review of Systems  Constitutional:  Negative for chills and fever.  Respiratory:  Positive for cough and sputum production. Negative for hemoptysis, shortness of breath and wheezing.   Cardiovascular:  Negative for chest pain, palpitations and leg swelling.  Gastrointestinal:  Negative for abdominal pain, constipation, diarrhea, nausea and vomiting.  Musculoskeletal:  Negative for myalgias.  Neurological:  Negative for dizziness, weakness and headaches.       Objective:    Vitals BP 116/68 (BP Location: Left Arm, Patient Position: Sitting,  Cuff Size: Normal)   Pulse (!) 111   Temp 97.9 F (36.6 C)   Resp 16   Ht 5\' 2"  (1.575 m)   Wt 111 lb 2 oz (50.4 kg)   SpO2 (!) 89%   BMI 20.33 kg/m    Physical Examination Physical Exam Constitutional:      Appearance: Normal appearance. She is not ill-appearing.  Cardiovascular:     Rate and Rhythm: Normal rate and regular rhythm.     Pulses: Normal pulses.     Heart sounds:  No murmur heard.    No friction rub. No gallop.  Pulmonary:     Effort: Pulmonary effort is normal. No respiratory distress.     Breath sounds: No wheezing, rhonchi or rales.  Abdominal:     General: Bowel sounds are normal. There is no distension.     Palpations: Abdomen is soft.     Tenderness: There is no abdominal tenderness.  Musculoskeletal:     Right lower leg: No edema.     Left lower leg: No edema.  Skin:    General: Skin is warm and dry.     Findings: No rash.  Neurological:     Mental Status: She is alert.        Assessment & Plan:   Acute cough Her CXR was noted and she has a history of COPD with pulmonary fibrosis and chronic respiratory failure.  Her antibiotic helped some.  We will obtain a CT scan of her chest and she may need an airway evaluation.  I want her to continue with her mucinex .  She is not having any fevers.    No follow-ups on file.   Wayne Haines, MD

## 2023-09-24 ENCOUNTER — Ambulatory Visit (HOSPITAL_BASED_OUTPATIENT_CLINIC_OR_DEPARTMENT_OTHER)
Admission: RE | Admit: 2023-09-24 | Discharge: 2023-09-24 | Disposition: A | Discharge status: Home / Self Care | Source: Ambulatory Visit | Attending: Internal Medicine | Admitting: Internal Medicine

## 2023-09-24 DIAGNOSIS — J9610 Chronic respiratory failure, unspecified whether with hypoxia or hypercapnia: Secondary | ICD-10-CM | POA: Diagnosis not present

## 2023-09-24 DIAGNOSIS — J8410 Pulmonary fibrosis, unspecified: Secondary | ICD-10-CM | POA: Diagnosis not present

## 2023-09-24 DIAGNOSIS — R051 Acute cough: Secondary | ICD-10-CM

## 2023-09-24 DIAGNOSIS — R918 Other nonspecific abnormal finding of lung field: Secondary | ICD-10-CM | POA: Diagnosis not present

## 2023-09-25 ENCOUNTER — Ambulatory Visit: Payer: Self-pay

## 2023-09-25 NOTE — Progress Notes (Signed)
 Pt has been informed, she has an appt set up with [place] on June 19th, her husband says they prefer to stay with tem.

## 2023-10-02 ENCOUNTER — Encounter: Payer: Self-pay | Admitting: Internal Medicine

## 2023-10-08 ENCOUNTER — Encounter: Payer: Self-pay | Admitting: Pulmonary Disease

## 2023-10-08 ENCOUNTER — Ambulatory Visit: Admitting: Pulmonary Disease

## 2023-10-08 VITALS — BP 130/79 | HR 102 | Ht 61.0 in | Wt 114.0 lb

## 2023-10-08 DIAGNOSIS — J432 Centrilobular emphysema: Secondary | ICD-10-CM | POA: Diagnosis not present

## 2023-10-08 DIAGNOSIS — J8410 Pulmonary fibrosis, unspecified: Secondary | ICD-10-CM

## 2023-10-08 MED ORDER — IPRATROPIUM-ALBUTEROL 0.5-2.5 (3) MG/3ML IN SOLN: 3.0000 mL | Freq: Four times a day (QID) | RESPIRATORY_TRACT | 11 refills | Status: AC | PRN

## 2023-10-08 NOTE — Progress Notes (Signed)
 Synopsis: Referred by Wayne Haines, MD for pulmonary fibrosis  Subjective:   PATIENT ID: Diamantina Form GENDER: female DOB: 09/11/1945, MRN: [id number]  HPI  Chief Complaint  Patient presents with  . Follow-up    New/ old PT states PCP found something concerning on scan    Pyper Olexa is a 78 year old woman, former smoker with emphysema who returns to pulmonary clinic for follow up.   OV 12/13/20 She was started on yupelri  and perforomist  nebulizer treatments after last visit.  She has not noticed much of a difference in her cough or sputum production.  She also expresses she does not like using the nebulizer treatments.  She did receive it prednisone  from her primary care team which made the biggest difference in her cough and sputum production.  She continues to experience sinus congestion and drainage despite using fluticasone  nasal spray daily and ipratropium nasal spray twice daily.  OV 10/08/23 She completed a 10-day course of doxycycline  for pneumonia. She has a persistent daily cough with mucus production, described as yellow and sometimes darker. She is not using nebulizers or inhalers but continues Mucinex  (600 mg twice daily) for mucus clearance.  CT Chest 09/24/23 similar upper lobe pulmonary fibrosis. There is consolidation in posterior right upper lobe and bilateral lower lobes.    Past Medical History:  Diagnosis Date  . 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 RK 9.3 cm and LK 11.2 cm on renal US   . 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 Allergy  06/09/2021  . Anemia   . Anxiety   . Arthritis   . Asthma   . Atrial fibrillation (HCC)   . Blood transfusion without reported diagnosis   . Cataract   . CHF (congestive heart failure) (HCC)   . Chronic kidney disease   . Clotting disorder (HCC)   . COPD (chronic obstructive pulmonary disease) (HCC)   . Dementia (HCC)   . Depression   . Diabetes  mellitus without complication (HCC)   . Dyspnea   . GERD (gastroesophageal reflux disease)   . Great toe pain, left 11/01/2021  . Headache   . Heart murmur   . 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 History of kidney stones   . Hyperlipidemia   . Hypothyroidism   . Myocardial infarction (HCC)   . Neuromuscular disorder (HCC)    tremors  . Pneumonia due to COVID-19 virus   . Postoperative wound infection 03/10/2014  . Seizures (HCC)   . Sleep apnea   . Stroke (HCC)   . Tension-type headache, not intractable 01/10/2016  . Thyroid  disease   . Wide-complex tachycardia      Family History  Problem Relation Age of Onset  . Heart disease Mother   . Heart attack Mother   . Breast cancer Mother   . Heart disease Sister      Social History   Socioeconomic History  . Marital status: Married    Spouse name: Marijean Shouts  . Number of children: 3  . Years of education: Not on file  . Highest education level: Not on file  Occupational History  . Not on file  Tobacco Use  . Smoking status: Former    Current packs/day: 0.00    Average packs/day: 0.5 packs/day for 30.0 years (15.0 ttl pk-yrs)    Types: Cigarettes    Start date: 06/27/1970    Quit date: 06/26/2000    Years  since quitting: 23.3  . Smokeless tobacco: Never  Vaping Use  . Vaping status: Former  Substance and Sexual Activity  . Alcohol  use: Not Currently  . Drug use: Never  . Sexual activity: Not on file  Other Topics Concern  . Not on file  Social History Narrative  . Not on file   Social Drivers of Health   Financial Resource Strain: Not on file  Food Insecurity: No Food Insecurity (06/17/2022)   Hunger Vital Sign   . Worried About Programme researcher, broadcasting/film/video in the Last Year: Never true   . Ran Out of Food in the Last Year: Never true  Transportation Needs: No Transportation Needs (06/17/2022)   PRAPARE - Transportation   . Lack of Transportation (Medical): No   . Lack of Transportation (Non-Medical): No   Physical Activity: Not on file  Stress: Not on file  Social Connections: Not on file  Intimate Partner Violence: Not At Risk (06/17/2022)   Humiliation, Afraid, Rape, and Kick questionnaire   . Fear of Current or Ex-Partner: No   . Emotionally Abused: No   . Physically Abused: No   . Sexually Abused: No     Allergies  Allergen Reactions  . Ciprofloxacin Hives    Ask patient  . Prochlorperazine Edisylate Other (See Comments)    Cant swallow Muscle cramping  . Morphine  Other (See Comments)    Confusion  . Prednisone  Other (See Comments)    unknown  . Reduced Iso-Alpha Acids Complex Other (See Comments)    unknown  . Statins Other (See Comments)    Muscle weakness   . Sulfa Antibiotics Swelling  . Azithromycin Rash  . Latex Rash     Outpatient Medications Prior to Visit  Medication Sig Dispense Refill  . albuterol  (VENTOLIN  HFA) 108 (90 Base) MCG/ACT inhaler Inhale into the lungs.    . aspirin  81 MG EC tablet Take 1 tablet (81 mg total) by mouth daily. Swallow whole. 30 tablet 0  . Carbinoxamine  Maleate (RYVENT ) 6 MG TABS Take 1 tablet (6 mg total) by mouth 2 (two) times daily as needed. 60 tablet 5  . donepezil  (ARICEPT ) 10 MG tablet TAKE 1 TABLET BY MOUTH EVERY DAY IN THE EVENING 90 tablet 1  . FARXIGA  10 MG TABS tablet TAKE 1 TABLET BY MOUTH ONCE DAILY BEFORE BREAKFAST 30 tablet 0  . guaiFENesin  (MUCINEX ) 600 MG 12 hr tablet Take 600 mg by mouth daily as needed for cough or to loosen phlegm.    Aaron Aas ipratropium (ATROVENT ) 0.03 % nasal spray Place 2 sprays into both nostrils every 12 (twelve) hours. 30 mL 12  . levocetirizine (XYZAL ) 5 MG tablet Take one tablet once daily 30 tablet 5  . levothyroxine  (SYNTHROID ) 100 MCG tablet Take 1 tablet (100 mcg total) by mouth daily. 30 tablet 11  . loperamide  (IMODIUM ) 2 MG capsule TAKE 1 CAPSULE BY MOUTH 4 TIMES DAILY AS NEEDED FOR DIARRHEA OR LOOSE STOOLS    . nitroGLYCERIN  (NITROSTAT ) 0.4 MG SL tablet Place 0.4 mg under the tongue  every 5 (five) minutes as needed.    . Nystatin (GERHARDT'S BUTT CREAM) CREA Apply 1 Application topically 3 (three) times daily. 1 each 0  . Olopatadine-Mometasone (RYALTRIS ) 665-25 MCG/ACT SUSP Place 2 sprays into both nostrils in the morning and at bedtime. 29 g 5  . omeprazole  (PRILOSEC) 40 MG capsule TAKE 1 CAPSULE BY MOUTH TWICE A DAY BEFORE A MEAL 180 capsule 1  . pregabalin  (LYRICA ) 75 MG capsule Take  1 capsule (75 mg total) by mouth 3 (three) times daily. 90 capsule 5  . Probiotic Product (PROBIOTIC PO) Take 15 Billion Cells by mouth daily.    Aaron Aas Propylene Glycol (SYSTANE COMPLETE OP) Apply 1 drop to eye as needed (Burning/Irritation).    . Rivaroxaban  (XARELTO ) 15 MG TABS tablet Take 1 tablet (15 mg total) by mouth daily. 90 tablet 3  . topiramate  (TOPAMAX ) 200 MG tablet Take 1 tablet (200 mg total) by mouth 2 (two) times daily. 180 tablet 1  . triamcinolone  cream (KENALOG ) 0.1 % Apply 1 Application topically 2 (two) times daily. 80 g 0  . doxycycline  (MONODOX ) 100 MG capsule Take 1 capsule (100 mg total) by mouth 2 (two) times daily. 20 capsule 0   No facility-administered medications prior to visit.    Review of Systems  Constitutional:  Negative for chills, fever, malaise/fatigue and weight loss.  HENT:  Positive for congestion. Negative for sinus pain and sore throat.   Eyes: Negative.   Respiratory:  Positive for cough, sputum production and shortness of breath. Negative for hemoptysis and wheezing.   Cardiovascular:  Negative for chest pain, palpitations, orthopnea, claudication and leg swelling.  Gastrointestinal:  Negative for abdominal pain, diarrhea, heartburn, nausea and vomiting.  Genitourinary: Negative.   Musculoskeletal: Negative.   Neurological:  Negative for dizziness, weakness and headaches.  Psychiatric/Behavioral: Negative.     Objective:   Vitals:   10/08/23 1318  BP: 130/79  Pulse: (!) 102  SpO2: 93%  Weight: 114 lb (51.7 kg)  Height: 5' 1 (1.549 m)    Physical Exam Constitutional:      Comments: thin  HENT:     Head: Normocephalic and atraumatic.     Nose: Nose normal.     Mouth/Throat:     Mouth: Mucous membranes are moist.     Pharynx: Oropharynx is clear.   Eyes:     General: No scleral icterus.    Conjunctiva/sclera: Conjunctivae normal.     Pupils: Pupils are equal, round, and reactive to light.    Cardiovascular:     Rate and Rhythm: Normal rate and regular rhythm.     Pulses: Normal pulses.     Heart sounds: Normal heart sounds. No murmur heard. Pulmonary:     Effort: Pulmonary effort is normal.     Breath sounds: Decreased breath sounds present. No wheezing, rhonchi or rales.   Musculoskeletal:     Right lower leg: No edema.     Left lower leg: No edema.   Neurological:     Mental Status: She is alert.    CBC    Component Value Date/Time   WBC 16.9 (H) 10/15/2022 1032   WBC 8.7 06/18/2022 0744   RBC 4.59 10/15/2022 1032   RBC 4.50 06/18/2022 0744   HGB 11.6 10/15/2022 1032   HCT 38.1 10/15/2022 1032   PLT 334 10/15/2022 1032   MCV 83 10/15/2022 1032   MCH 25.3 (L) 10/15/2022 1032   MCH 25.3 (L) 06/18/2022 0744   MCHC 30.4 (L) 10/15/2022 1032   MCHC 29.8 (L) 06/18/2022 0744   RDW 15.9 (H) 10/15/2022 1032   LYMPHSABS 3.4 (H) 10/15/2022 1032   MONOABS 0.8 06/17/2022 0608   EOSABS 0.6 (H) 10/15/2022 1032   BASOSABS 0.2 10/15/2022 1032     Chest imaging: CT Chest 09/24/23 Mediastinum/Nodes: Imaged thyroid  gland without nodules meeting criteria for imaging follow-up by size. Normal esophagus. No pathologically enlarged axillary, supraclavicular, mediastinal, or hilar lymph nodes. Partially calcified mediastinal  lymph nodes, likely sequela prior granulomatous infection.   Lungs/Pleura: The central airways are patent. Unchanged 4 mm right upper lobe nodule (302:26). Grossly similar appearance of the upper lobe predominant, right-greater-than-left, pulmonary fibrosis with architectural distortion,  bronchiolectasis, and diffuse reticulations. Increased irregular consolidation within the posterior right upper lobe. Bilateral lower lobe, symmetric bronchiectasis and bronchiolectasis with multifocal filling defects interspersed with areas of irregular consolidation, left-greater-than-right. No pneumothorax. No pleural effusion.  CT Chest 06/20/20 Lungs/Pleura: Moderate centrilobular emphysema. Areas of scarring/fibrosis noted bilaterally in the upper lobes and lower lobes. No acute confluent opacities or effusions.  CXR 03/24/20 Background peripheral interstitial fibrotic pattern throughout both lungs, better demonstrated by previous chest CT. Some improvement in the right upper lobe airspace opacity. Residual areas of airspace disease remained in both upper lobes as well as the lower lobes compatible with multilobar pneumonia. No enlarging effusion or pneumothorax. Stable heart size. Previous coronary bypass changes. Aorta atherosclerotic. Trachea midline. Bones are osteopenic. Degenerative changes noted spine.  CTA Chest 03/22/20 Mediastinum/Nodes: Chronic adenopathy with asymmetric right hilar and paratracheal nodal enlargement, some with dystrophic coarse calcifications, likely post infectious/reactive.  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 There is superimposed acute airspace disease in the right upper lobe and to a lesser extent at the bilateral lung bases. Tracheobronchomalacia. No visible effusion or pneumothorax. Motion artifact. There is a background of emphysema which was better seen in 2020  PFT:    Latest Ref Rng & Units 09/12/2020    2:49 PM  PFT Results  FVC-Pre L 1.32   FVC-Predicted Pre % 52   FVC-Post L 1.55   FVC-Predicted Post % 62   Pre FEV1/FVC % % 75   Post FEV1/FCV % % 70   FEV1-Pre L 0.99   FEV1-Predicted Pre % 53   FEV1-Post L 1.08   DLCO uncorrected ml/min/mmHg 6.99   DLCO UNC% % 40   DLCO corrected  ml/min/mmHg 6.99   DLCO COR %Predicted % 40   DLVA Predicted % 59   TLC L 3.39   TLC % Predicted % 73   RV % Predicted % 88   PFT 2022: Mixed obstructive and restrictive defect with moderate diffusion defect.    Assessment & Plan:   Pulmonary fibrosis (HCC)  Centrilobular emphysema (HCC) - Plan: ipratropium-albuterol  (DUONEB) 0.5-2.5 (3) MG/3ML SOLN  Discussion: Sumayah Bearse is a 78 year old woman, former smoker with emphysema who returns to pulmonary clinic for follow up.    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 Recent CT shows inflammation and mucus correlating with cough and mucus production. No acute exacerbation. - Continue Mucinex  (Guaifenesin ) 600 mg twice daily. - Initiate Duoneb (albuterol  and ipratropium) nebulizer twice daily, with optional third dose. - Use flutter valve device post-nebulizer to clear mucus. - Ensure nebulizer machine and supplies are available at home.  Chronic cough with mucus production Chronic cough with clear yellow mucus, history of darker mucus possibly mixed with old blood due to inflammation.  Follow up in 6 months  Duaine German, MD [facility] Office: [phone]    Current Outpatient Medications:  .  ipratropium-albuterol  (DUONEB) 0.5-2.5 (3) MG/3ML SOLN, Take 3 mLs by nebulization every 6 (six) hours as needed., Disp: 360 mL, Rfl: 11 .  albuterol  (VENTOLIN  HFA) 108 (90 Base) MCG/ACT inhaler, Inhale into the lungs., Disp: , Rfl:  .  aspirin  81 MG EC tablet, Take 1 tablet (81 mg total) by mouth  daily. Swallow whole., Disp: 30 tablet, Rfl: 0 .  Carbinoxamine  Maleate (RYVENT ) 6 MG TABS, Take 1 tablet (6 mg total) by mouth 2 (two) times daily as needed., Disp: 60 tablet, Rfl: 5 .  donepezil  (ARICEPT ) 10 MG tablet, TAKE 1 TABLET BY MOUTH EVERY DAY IN THE EVENING, Disp: 90 tablet, Rfl: 1 .  FARXIGA  10 MG TABS tablet, TAKE 1 TABLET BY MOUTH ONCE  DAILY BEFORE BREAKFAST, Disp: 30 tablet, Rfl: 0 .  guaiFENesin  (MUCINEX ) 600 MG 12 hr tablet, Take 600 mg by mouth daily as needed for cough or to loosen phlegm., Disp: , Rfl:  .  ipratropium (ATROVENT ) 0.03 % nasal spray, Place 2 sprays into both nostrils every 12 (twelve) hours., Disp: 30 mL, Rfl: 12 .  levocetirizine (XYZAL ) 5 MG tablet, Take one tablet once daily, Disp: 30 tablet, Rfl: 5 .  levothyroxine  (SYNTHROID ) 100 MCG tablet, Take 1 tablet (100 mcg total) by mouth daily., Disp: 30 tablet, Rfl: 11 .  loperamide  (IMODIUM ) 2 MG capsule, TAKE 1 CAPSULE BY MOUTH 4 TIMES DAILY AS NEEDED FOR DIARRHEA OR LOOSE STOOLS, Disp: , Rfl:  .  nitroGLYCERIN  (NITROSTAT ) 0.4 MG SL tablet, Place 0.4 mg under the tongue every 5 (five) minutes as needed., Disp: , Rfl:  .  Nystatin (GERHARDT'S BUTT CREAM) CREA, Apply 1 Application topically 3 (three) times daily., Disp: 1 each, Rfl: 0 .  Olopatadine-Mometasone (RYALTRIS ) 665-25 MCG/ACT SUSP, Place 2 sprays into both nostrils in the morning and at bedtime., Disp: 29 g, Rfl: 5 .  omeprazole  (PRILOSEC) 40 MG capsule, TAKE 1 CAPSULE BY MOUTH TWICE A DAY BEFORE A MEAL, Disp: 180 capsule, Rfl: 1 .  pregabalin  (LYRICA ) 75 MG capsule, Take 1 capsule (75 mg total) by mouth 3 (three) times daily., Disp: 90 capsule, Rfl: 5 .  Probiotic Product (PROBIOTIC PO), Take 15 Billion Cells by mouth daily., Disp: , Rfl:  .  Propylene Glycol (SYSTANE COMPLETE OP), Apply 1 drop to eye as needed (Burning/Irritation)., Disp: , Rfl:  .  Rivaroxaban  (XARELTO ) 15 MG TABS tablet, Take 1 tablet (15 mg total) by mouth daily., Disp: 90 tablet, Rfl: 3 .  topiramate  (TOPAMAX ) 200 MG tablet, Take 1 tablet (200 mg total) by mouth 2 (two) times daily., Disp: 180 tablet, Rfl: 1 .  triamcinolone  cream (KENALOG ) 0.1 %, Apply 1 Application topically 2 (two) times daily., Disp: 80 g, Rfl: 0

## 2023-10-08 NOTE — Patient Instructions (Signed)
 Start using Duoneb nebulizer treatments twice daily, can do a third treatment in the middle of the day if needed  Follow the nebulizer treatment with the flutter valve, doing 5-10 go exhalations through the device  Your CT chest scan shows emphysema and pulmonary fibrosis, we will continue to monitor this  Follow up in 6 months, call sooner if needed

## 2023-10-15 ENCOUNTER — Other Ambulatory Visit: Payer: Self-pay | Admitting: Cardiology

## 2023-10-15 DIAGNOSIS — I480 Paroxysmal atrial fibrillation: Secondary | ICD-10-CM

## 2023-10-15 NOTE — Telephone Encounter (Signed)
 Xarelto  15mg  refill request received. Pt is 78 years old, weight-51.7kg, Crea-0.97 on 04/22/23, last seen by Dr. Bernie on 08/09/23, Diagnosis-Afib, CrCl- 39.01 mL/min; Dose is appropriate based on dosing criteria. Will send in refill to requested pharmacy.

## 2023-10-24 ENCOUNTER — Other Ambulatory Visit: Payer: Self-pay

## 2023-10-24 ENCOUNTER — Encounter: Payer: Self-pay | Admitting: Internal Medicine

## 2023-10-24 ENCOUNTER — Ambulatory Visit: Admitting: Internal Medicine

## 2023-10-24 VITALS — BP 118/72 | HR 86 | Temp 98.6°F | Resp 16 | Ht 62.0 in | Wt 114.0 lb

## 2023-10-24 DIAGNOSIS — G4733 Obstructive sleep apnea (adult) (pediatric): Secondary | ICD-10-CM | POA: Insufficient documentation

## 2023-10-24 DIAGNOSIS — E559 Vitamin D deficiency, unspecified: Secondary | ICD-10-CM | POA: Insufficient documentation

## 2023-10-24 DIAGNOSIS — I480 Paroxysmal atrial fibrillation: Secondary | ICD-10-CM

## 2023-10-24 DIAGNOSIS — R413 Other amnesia: Secondary | ICD-10-CM | POA: Insufficient documentation

## 2023-10-24 DIAGNOSIS — E7800 Pure hypercholesterolemia, unspecified: Secondary | ICD-10-CM

## 2023-10-24 DIAGNOSIS — I5032 Chronic diastolic (congestive) heart failure: Secondary | ICD-10-CM

## 2023-10-24 DIAGNOSIS — J310 Chronic rhinitis: Secondary | ICD-10-CM

## 2023-10-24 DIAGNOSIS — R300 Dysuria: Secondary | ICD-10-CM

## 2023-10-24 DIAGNOSIS — J8410 Pulmonary fibrosis, unspecified: Secondary | ICD-10-CM

## 2023-10-24 DIAGNOSIS — K589 Irritable bowel syndrome without diarrhea: Secondary | ICD-10-CM | POA: Insufficient documentation

## 2023-10-24 DIAGNOSIS — E1159 Type 2 diabetes mellitus with other circulatory complications: Secondary | ICD-10-CM

## 2023-10-24 DIAGNOSIS — J9611 Chronic respiratory failure with hypoxia: Secondary | ICD-10-CM

## 2023-10-24 DIAGNOSIS — I872 Venous insufficiency (chronic) (peripheral): Secondary | ICD-10-CM

## 2023-10-24 DIAGNOSIS — J449 Chronic obstructive pulmonary disease, unspecified: Secondary | ICD-10-CM

## 2023-10-24 DIAGNOSIS — N1832 Chronic kidney disease, stage 3b: Secondary | ICD-10-CM | POA: Diagnosis not present

## 2023-10-24 DIAGNOSIS — Z6820 Body mass index (BMI) 20.0-20.9, adult: Secondary | ICD-10-CM | POA: Insufficient documentation

## 2023-10-24 DIAGNOSIS — Z789 Other specified health status: Secondary | ICD-10-CM

## 2023-10-24 DIAGNOSIS — E039 Hypothyroidism, unspecified: Secondary | ICD-10-CM

## 2023-10-24 DIAGNOSIS — Z112 Encounter for screening for other bacterial diseases: Secondary | ICD-10-CM | POA: Diagnosis not present

## 2023-10-24 DIAGNOSIS — I25810 Atherosclerosis of coronary artery bypass graft(s) without angina pectoris: Secondary | ICD-10-CM

## 2023-10-24 DIAGNOSIS — I679 Cerebrovascular disease, unspecified: Secondary | ICD-10-CM

## 2023-10-24 DIAGNOSIS — E1121 Type 2 diabetes mellitus with diabetic nephropathy: Secondary | ICD-10-CM

## 2023-10-24 DIAGNOSIS — I10 Essential (primary) hypertension: Secondary | ICD-10-CM

## 2023-10-24 DIAGNOSIS — M150 Primary generalized (osteo)arthritis: Secondary | ICD-10-CM

## 2023-10-24 LAB — POCT URINALYSIS DIPSTICK
Bilirubin, UA: NEGATIVE
Glucose, UA: POSITIVE — AB
Ketones, UA: NEGATIVE
Nitrite, UA: NEGATIVE
Protein, UA: POSITIVE — AB
Spec Grav, UA: 1.02 (ref 1.010–1.025)
Urobilinogen, UA: 0.2 U/dL
pH, UA: 5.5 (ref 5.0–8.0)

## 2023-10-24 MED ORDER — CEPHALEXIN 500 MG PO CAPS: 500.0000 mg | ORAL_CAPSULE | Freq: Two times a day (BID) | ORAL | 0 refills | Status: AC

## 2023-10-24 NOTE — Progress Notes (Unsigned)
 Preventive Screening-Counseling & Management    Jenny Kelly is a 78 year old Caucasian/White female who presents for her annual wellness exam. She is due for the following health maintenance studies: mammogram, visual exam, colonoscopy, and screening labs. This patient's past medical history Aortic Atherosclerosis, Atrial Fibrillation, B12 deficiency, CAD, Cerebrovascular Accident (CVA), Chronic kidney disease, Stage III (moderate), Chronic Venous Insuffiency, COPD, Depression, Diabetes Mellitus, Type II, Diastolic heart failure; chronic, Eosinophliic Esophagitis, Hyperlipidemia, Irritable Bowel Syndrome, Obstructive Sleep Apnea, Pulmonary Embolism, and Respiratory Failure, Chronic.    Her last dilated diabetic eye exam was done in 11/2022 by Dr. Linda but I do not have this report.  She states her vision is doing well. Her last colonoscopy was in 08/2014 and this showed internal hemorrhoids but was otherwise normal. They want to repeat her colonoscopy in 5 years which we referred her in 2021. The GI office called them but she did not go as she was not feeling up to it.  She had a previous colonoscopy in 04/2011 and this showed polyps. I did refer her last year for colonoscopy where she saw Dr. Larene on 09/05/2020 and he wanted to consider a future colonoscopy if her health improved.  The patient is telling me that he does not want to repeat a colonoscopy unless she has a problems.  She had a screening digital mammogram done in 07/22/2019 and this was normal. We referred her for a mammogram in 2023 but they tell me she did not go.  She does not want anymore mammograms.  She has had a total hystrectomy in the past and no longer needs pap smears. Her last DEXA scan was done in 06/2010 and this was normal. The patient does not exercise regularly. The patient does get yearly flu vaccines. She had a pneumovax 23 and zostavax shingles vaccine done at age of 75 at [facility]. She had a prevnar 13 vaccine  in 2016. I referred her for a RSV vaccine in 2024 but she never went.  She has had 2 COVID-19 vaccines but no boosters. She is on Xarelto  15mg  daily and ASA 81mg  daily.   I did see her in 09/20/2023 for continued symptoms of cough.  I saw her 11 days prior where we did a CXR on 09/09/2023 and that shows chronic interstital and fibrotic changes in both lungs without a superimposed process or significant change since her CXR done in 04/2023.  I sent him home on doxycycline  and she has continue on mucinex .  I saw her 11 days ago where she had complaints of cough productive dark yellow/black mucus that began about 3 days prior.  She denied any fevers, chills, sinus congestion, myalgias, no worsening headaches, SOB or wheezing.   She was tested for COVID-19 and this negative. Over the interim, she states that the antibiotics did help and she is still coughing up dark yellow mucus.  Her cough is causing her to have a headache but she still has no fevers, chills, SOB, chest congestion, wheezing or other problems.  Her CXR was noted and she has a history of COPD with pulmonary fibrosis and chronic respiratory failure.  Her antibiotic helped some.  I wanted her to continue with her mucinex .  We did obtain a CT scan of her chest on 09/24/2023 and this showed increased irregular consolidation within the posterior right upper lobe and bilateral lower lobes, left-greater-than-right, which may be related to chronic aspiration or secondary to indolent infection such as mycobacterium avium complex.  Grossly similar appearance of the upper lobe predominant, right-greater-than-left, pulmonary fibrosis with architectural distortion, bronchiolectasis, and diffuse reticulations.  Aortic Atherosclerosis and Coronary artery calcifications. At that point, I referred her to pulmonary who saw her on 10/08/2023.  They felt that she had pulmonary fibrosis with centrilobular emphysema where she had chronic pulmonary fibrosis with emphysema,  stable compared to previous CT.  This recent CT showed inflammation and mucus correlating with cough and mucus production. No acute exacerbation.  He wanted to continue Mucinex  600 mg twice daily and initiate Duoneb (albuterol  and ipratropium) nebulizer twice daily, with optional third dose.  She could also use a flutter valve device post-nebulizer to clear mucus.  Today, she states that her cough is fair.    This patient also has moderately severe CAD where she was diagnosed with a heart attack in 06/2000 and presents today for a status visit. See PMH for summary of cardiac history and status of revascularization. She underwent cardiac catherization in 2002 and had stent placement. The patient undewent Lexiscan  nuclear stress test on 09/2012 and this showed no evidence of ischemia and a normal systolic function.  However in 11/2013, she was complaining of some chest discomfort to me.  I sent her to see Cardiology where ultimately she was admitted to [facility] in 11/2013 and she had a heart catherization and eventual 3 vessel CABG performed.  She has been following with cardiology regularly and has not had any CP, or other problems.   Her CAD is controlled with therapy as summarized in the medication list and previous notes. The patient has the following comorbid condition(s): atrial fibrillation. She has no baseline symptoms of CAD. She has the following modifiable risk factor(s): DM, hyperlipidemia, and sedentary lifestyle. Specifically denied complaint(s): chest pain, palpitations, edema, exertional dyspnea which she attributes to her COPD, and syncope.  She last saw Cardiology 10/18/2022 and they felt her CAD and A. Fib were stable.  She saw cardiology on 08/09/2023 where they felt her CAD was stable and to continue her current therapy.  Arland FALCON. Karpf returns today for routine followup on her cholesterol.  On her last visit, her cholesterol was elevated but we did not put her on any meds due to her complications of  her medications.  This past year, her cholesterol was elevated and I increased her pravastatin  from 40mg  to 80mg  daily.  Unfortunately, she had myalgias from this and discontinued her pravastatin .  She just saw Dr. MARLA in cardiology on 03/09/2022 and he wanted her to started zetia.  She did not do this as she has been on zetia before and this has caused severe diarrhea.  I wanted her to start nexletol for her high cholesterol as she has an intolerance to statins.  She never did start the medicine as she was afraid of catching pancreatitis again.  Again, she stopped praulent over a year ago as it may have caused her pancreatitis.  Overall, she states she is doing well and is without any complaints or problems at this time. She has a history of statin intolerance where she has had myalgias from zocor, lipitor, pravastatin , and crestor. She was also tried on welchol and she did not like taking this. She specifically denies abdominal pain, nausea, vomiting, diarrhea, myalgias, and fatigue. She remains on dietary management at this time.  She was on fenofibrate but they stopped this as it could have caused her pancreatitis.  After her stroke in 04/2021, I started her on pravastatin  40mg  qhs but again  she stopped this due to myalgias..  She tries to control her cholesterol with diet and she has now quit fish oil.  She is fasting in anticipation for labs today.   She also has a history of chronic diastolic CHF.  Apparently in 03/2018, she had acute on chronic diastolic CHF where they did diuresis.  Today, she denies any swelling in her bilateral lower feet.  We started her on some prn Lasix which has not had to use in over 4-5 years. She also has a history of chronic venous insuffiency but again no recent swelling.  She has not had to use compression hose in over 4-5 years. Again, she saw cardiology on 08/09/2023 and felt her chronic diastolic CHF was compensated at that time.  During her hospitalization in 04/2021, she had  an ECHO which showed an EF of 60-65%.  This patient also has mild atrial fibrillation of about years known duration and presents today for a status visit. Her atrial fibrillation is controlled with therapy as summarized in the medication list and previous notes.  The patient was on amiodarone  but this was stopped when she was hospitalized.  She remains anticoagulated with Xarelto .SABRA  Specifically denied complaints: chest pain, palpitations, TIAs, edema, exertional dyspnea, and syncope. Interval history: no healthcare visits with other providers since last seen in this office. Interval studies: none. Anticoagulation status: Xarelto  15mg  daily.   Her CHAD-VASC2 score is a 6. There is no bleeding, bruising, BRBPR or melena.  The patient is a 78 year old Caucasian/White female who returns for a follow-up visit for her diet controlled diabetes. Since the last visit, there have been no problems. She remains on no medications; the diabetes is being managed by dietary intervention alone. She is not walking as much as they would like.. She specifically denies unexplained abdominal pain, nausea or vomiting and documented hypoglycemia. She has not been checking her blood sugars at home as they do not have a glucometer. She came in fasting today in anticipation of lab work. Her last HgBA1c was done 6 months ago and was 6.4%.  There are no complications of diabetic retinopathy or neuropathy.  She has CKD probably secondary to her HTN and diabetic nephropathy.  She has a history of cardiovascular diease as well and history of stroke but her diabetes has always been controlled. She had a dilated diabetic eye exam done by Dr. Linda in 11/2022 but I do not have this record.  The patient has a history of Stage IIIb CKD.  She was seeing the nephrologist once every 3-4 months with her last visit was in 2019 and she states that she is not going back.  Her creatinine at that time was 1.5.  Her GFR is around from 30-50 however she  had recent labs this past year that showed her GFR was greater than 60.   We have noted that she went from Stage IIIa to Stage IIIb CKD over the past 1-2 years.  We did a urine study with albumin/creatinine ratio on her last visit and she was spilling protein.  We therefore started her on farxiga  10mg  daily. She denies any NSAIDS use.  The patient is a 78 year old Caucasian/White female who returns today for followup of her hypertension.  Since her last visit, her patient has not had any problems.  The patient has not been checking her BP at home.  The patient's current medications include:  none.  She used to be on midodrine  10 mg oral  tablet TID.  She has had problems with orthostatic hypotension in the past but has not had a problems with this for years.  The patient denies any dizziness, lightheadness, blurred vision, chest pain, shortness of breath, generalized weakness or edema.   The patient is a 78 year old Caucasian/White female who returns for a regularly scheduled thyroid  check.  This past year, we increased her from levothyoxine 88mcg daily to 100mcg daily.  She claims to have no symptoms suggestive of thyroid  imbalance specifically denying  cold intolerance, heat intolerance, tremors, anxiety, unexplained weight changes, diarrhea, constipation, dry skin or insomnia.     Arlyne is a 78 yo female who also returns for her history of post stroke headaches. Today, she states that her headaches are doing better.  Last year, she had worsening headaches and at that time I felt she had a vascular component to her headaches.  She was taking oxycodone  given to her by Jolynn Pack for her headaches and I asked her to stop this.  I did place her on low dose labetalol  for headache prevention and increased her lyrica  from 75mg  BID to 75mg  TID.  She is adding tyelnol 650mg  at times to help.  In the past, we had tried her on tylenol  #3 with caffeine  but the patient only took this a couple of times per her husband.   She states it did not work.  She remains on topamax  200mg  BID.  Again, she was admitted to [facility] in 12/2021 for intractable headaches.  She has a history of post stroke headaches where I have placed her on lyrica  in the past which has helped this.  She developed a severe bitemporal headache (like a band across her forehead) on the day of admission where she initially thought she was having a stroke and 911 was called.  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  Neurology was consulted and the patient underwent a MRI and CT scans of the brain which were negative.  They did a temporal artery US  which was negative.  Neurology recommended a CT myelogram which showed a CSF contrast extending into the right L4-L5 foramen.  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  She continued to have have symptoms and she went for a repeat blood patch and her symptoms improved.  The doctors wanted her to followup with [facility] neurology as an outpatient.  She has not followed up with Neurology.   Today, she states her headaches are still occurring daily and she is taking her lyrica  75mg  in AM and PM but she takes an extra dose in the afternoon.  She states tylenol  does help.  There is no new weakness/numbness, change in her vision or other problems.  Neurology did a  CT scan of her head in 05/2015 and this showed an old stroke but nothing new. She did have an overnight EEG done on 12/30/2021 and this showed moderate diffuse encephalopathy with nonspecific etiology but no seizures were noted.  She had a MRI of her brain on 12/27/2021 which showed no acute intracranial abnormality but there were old unchanged signal abnormality in her right basal ganglia related to prior infarct.  There was encephalomalacia involving the right MCA territory including the right basal ganglia.  There was mild chronic microvascular  disease.  She also had some memory problems from her stroke in the  past. She was started on aricept  for her memory and remains on this.  In 2023, her MMSE was 29 and today on 10/24/2023 her MMSE is 20/30.  Her husband has noticed that her memory has worsened over the last 6 months.  There was some question from neurology in the past about possible seizure from falls 3-4 years ago where they placed her on Topamax  which we will continue   Also this past year, Mrs. Dildine was admitted to [facility] from 11/01/2021 until 11/04/2021 for acute metabolic encephalopathy with acute on chronic hypoxic respiratory failure due to pneumonia.  Her husband states she became more somnolent where 911 was activated.  On arrival they found her to have an oxygen  sat of 60% on RA.  They started her on oxygen  via ANC and brought her to [facility] ER.  During her workup, she was found to have tachypnea, tachycardia and an elevated WBC of 22K where she met criteria for sepsis.  She was also noted to have swelling in her left great toe.  Xrays of her toe and chest however showed no acute abnormality.  She was also noted to have acute on chronic renal failure at that time.  Due to her having sepsis of unknown cause at that time, they felt she could have had a UTI and they did a CT scan of her abdomen and pelvis.  This showed persistent left nephrolithiasis without obstruction with prior left stent removal.  However, they noted she had bilateral pneumonia seen on the abdomen/pelvis scan and they also noted fecal impaction.  She was admitted to the hospital and resumed on oxygen  and started on IVF's and antibiotics.  Her acute metabolic encephalopathy improved.  She had 2 normal BM's and they stopped her off laxatives.  Her acute renal failure improved.  She improved to RA and was able to ambulate and was sent home with 5 days of oral antibiotics.  They felt she had pneumonia superimposed on chronic bronchiectasis.  The hospitalists also felt  she had an acute left great toe gout flare where she was given colchicine  and her uric acid level was 7.7 at that time.  She is no longer on oxygen  but has it available if she needs it.   Mrs. Gaugh was hospitalized for urosepsis and history of kidney stones with stent placement in 04/2021.  She had outpatient surgery on 08/09/2021 she underwent a cystoscopy with left ureteroscopy with laser lithotripsy and a left ureteral stent exchange.  She did see Dr. Carolynn on 08/17/2021 and they noted at that time that they had removed her stent at home without problems.  They noted blood in her urine at that time.  Again, she was hospitalized in 06/2021 where she presented with hematuria and fever.  They diagnosed her with a UTI and sent her home on antibiotics.  However, the next day she continued to have fever where her husband had her taken to [facility].  In the ER they felt she had a UTI with sepsis.  This was due to a probable obstructing known kidney stone where she had a stent placed during hospitalization in 04/2021.  She was placed on broad spectrum antibiotics.  I reviewed her urine culture and this showed low CFU of E. coli.  She had some acute delirium per her husband that occurred after they gave her morphine  at the hospitalization.  The patient was sent home on antibiotics and followed up with urology as described above.  The patient was seen therefore was referred to Dr. Selma in ortho where she saw him on on 08/18/2021 where he did xrays of her shoulder showing arthritis.  He gave her a Kenalog  injection in her left shoulder.  This did help her and she was able to go back to therapy and she has finished the homehealth therapy.  She states she still has pain in her left shoulder at times.       Mrs. Macfadden was hospitalized for a new stroke as well as UTI/Sepsis in 04/2021.  She presented to [facility] on 05/01/2021 with new onset left side weakness.  A CT scan of her head done there showed a new  acute M1 and M2 occlusion.  She does have a history of PVD.  She was also noted to have a UTI with sepsis.  The patient was transferred to [facility] where she was seen by neurology and a repeat CT scan of her head demonstrated a new right MCA infarct.  She was placed on IVF's antibiotics and did require pressors for her infection.  She did go into acute respiratory failure with possible aspiration pneumonia requiring BIPAP where she was subsequently weaned down to 2L of O2 via [place].  Urology saw her and she was discovered to have a 7mm left ureteral stone where she underwent cystoscopy and ureteral stent placement.   An ECHO was performed which showed an EF of 60-65%.  Neurology changed her Xarelto  for A. fib from 20mg  daily to 15mg  daily and also wanted to start her on ASA 81mg  daily.  The patient was noted to have continued left side weakness where she was sent to inpatient rehab where she stayed from 1/14 until 1/24.  She completed a course of antibiotics and continued with therapies at that time. Since then, she has had new left side weakness from this stroke that effects both her left arm and left leg.  Since her last visit with me, she states the weakness in her left arm has improved with therapies.  She saw neurology as an outpatient on 06/07/2021 and recommended secondary prevention as we have been doing for her history of stroke.  She does use a rollator and a cane at home.    She has had elevated Vitamin D  in the past where she was placed by one of her doctor's on Vitamin D  where she was on Vit D 50,000 units twice a week.  I did followup lab testing and her Vit D level had come down.    The patient unfortunately contracted COVID-19 where she was hospitalized at [facility] in 07/2019. She had progressive weakness, confusion and cough where she was brought into the ER and they found she was hypoxic with sats at 85%.  She was placed on oxygen  and a CXR was obtained which demonstrated a viral pneumonia.  She was  treated with a 5 day course of remedesvir and 10 day course of decadron.  They did not CT scan her lungs.  I am told by her husband she went up to 15 L on oxygen  while she was in the hospital.  They also felt she had a secondary bacterial pneumonia as well as a Klebsiella UTI where she was treated.  She also had COVID-19 induced diarrhea that developed into ARF from her diarrhea/dehydration.  This improved with IVF's.  She did improve but they wanted to discharge her for short term rehab but her husband took her home.  She did receive homehealth  PT and recovered and was sent home on 1L of oxygen  via [place] which she only used for about 2 weeks as her oxygen  levels improved.   She was rehospitalized from 03/22/2020 through 03/26/2020 due to community acquired pneumonia.  They performed a CTA of her chest on 03/22/2020 and this showed apical pulmonary fibrosis with superimposed acute airspace disease in the right upper lobe.  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    She did recover but I did send her to [place] pulmonary in 03/2020.   She has fibrotic changes after her COVID-19 pneumonia as well as chronic cough with sputum production and chronic rhinitis.  They have started her on ipratropium nebs and flutter valve as well as Flonase  nasal spray and ipratropium nasal spray for her rhinitis.  She did followup with pulmonary on 06/14/2020 where they ordered and performed another high resolution CT scan of her chest which showed chronic scarring/fibrosis in her Bilateral upper lobes and moderate centrilobular emphysema.  She last saw pulmonary in 12/13/2020 and they recommend her continued inhaled meds.  She also saw Dr. Kozlow in allergy /immunology on 02/2021 and he recommended continued inhaled meds as well as meds for her allergies and her GERD.  She is on omeprazole  for her GERD.   She is not having GERD at this time but continues with a chronic cough that is non-productive.  Today, there  is no change in her chronic cough nor her breathing.  She is not on oxygen  at this time.   The patient also presents with COPD of years duration and presents today for a status visit.  She did have a pneumonia with COPD that was treated in 11/2018.  She was again diagnosed with pneumonia in 02/2018.  She has a chronic cough as described above.  She saw Dr. Beulah in August 2014 where he was following her for chronic respiratory failure. She also has a history of OSA and was on nightly CPAP but she stopped this over 6 years ago.  See PMH for summary of pulmonary history and lab reports for status of any previous PFTs: COPD, Obstructive Sleep Apnea, and Respiratory Failure, Chronic. Comorbid conditions : atrial fibrillation. She has the following baseline symptoms: exertional dyspnea. The exertional dyspnea used to be brought on by walking about 300-400 feet but after COVID-19, she could only walk about 100 feet before she gets SOB.  However, since her stroke in 04/2021, she states she has no SOB at rest but can walk about 10 feet before getting DOE but this has now improved to about 100 feet.   has the following modifiable risk factors: sedentary lifestyle. Specifically denied complaints: worsening exertional dyspnea, increased wheezing, change in color of sputum, pleuritic chest pain, hemoptysis, and fever. Interval history: no healthcare visits with other providers since last seen in this office. Interval studies: none.  She has an albuterol  HFA but has not had to use it for months.  Her husband states that when she was in the hospital in 07/2021 they stopped her off a number of her inhalers. She states she feels better off these meds. Her husband states she has a history of sleep apnea where she had a sleep study.  She was prescribed a CPAP but the patient states she could not tolerate this.   Labresha also has a history of anemia where she had a 3 point drop in her HgB in 10/2019 where her HgB in 06/2019 was 13.7  and in 10/2019  it was 10.7.  They contacted Dr. Larene and he felt she was high risk for endoscopy and the patient wanted to just follow this.  Dr. Larene started her on iron but she states she had to stop this due to diarrhea.  We saw her in 05/2018 for a yearly exam and her lab tests showed she had some anemia.  We did hemoccult testing and this was positive where we sent her to see GI.  They wish to proceed with colonoscopy at some point.  She denies any BRBPR or melena.  Adabella has a history of dysphagia where she has seen Dr. Nicolina and Dr. Towana in the past.  She saw Dr. Towana in 02/2013 for her dysphagia.  She has had a workup for this in the past where a barium swallow showed esophageal dysmotlity.  An EGD done in 04/2011 showed erosive esophagitis and duodenitis and a small hiatal hernia.  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  She does not take this.  She has had esophageal dilatation done in 2013 and had another dilatation done in 08/2014.  She did have a repeat EGD in 04/2018 which showed mild gastritis and duodenitis with a smal hiatal hernia.  Her esophagus was dilated at that time.  She denies any problems with swallowing at this time.        The patient returns for followup of her depression.   In 05/2018, she scored higher on her PHQ9 but did not want to go up on her medications.  I saw her in 2022 and she stated at that time her depression was resolved.  Again, she weaned off her cymbalta in 2019 and she has not had any worsening of her depression.  She reports no additional symptoms.  She no longer uses amitriptyline for sleep. She denies difficulty concentrating, difficulty performing routine daily activities, fatigue, extreme feelings of guilt, feelings of isolation, weight loss, insomnia, loss of appetite, social withdrawal, loss of interest in pleasurable activities, out of control feelings, and panic attacks. This  patient feels that she is able to care for herself. She currently lives with her husband. She has no significant prior history of mental health disorders.    Mrs. Schleifer also has history of pancreatitis where she was admitted to the hospital in 03/2018 with pancreatitis and in 04/2018 with SOB and dehydration.  Again, she had a hospitalization in 02/2018 for pneumonia.  She saw Dr. Larene who did an EGD on 04/28/2018 which showed gastritis and duodenitis and placed her on twice daily PPI.  She did see Dr. Larene in 2021 who started her on bentyl for her IBS which she states helps a lot at that time.  She has not used it since her visit in 2021.     She also has a history of pulmonary embolism which was diagnosed in 07/2000 after her cardiac catherization.  She continued to have pain after this procedure and was found to have pulmonary embolism.  The patient has never had a DVT and was told she needs life long anticoagulation.  She remains on xarelto  daily.  There is no bleeding, bruising, BRBPR, melena or other problems.         Are there smokers in your home (other than you)? No  Risk Factors Current exercise habits: As above  Dietary issues discussed: none   Depression Screen (Note: if answer to either of the following is Yes, a more  complete depression screening is indicated)   Over the past two weeks, have you felt down, depressed or hopeless? No  Over the past two weeks, have you felt little interest or pleasure in doing things? No  Have you lost interest or pleasure in daily life? No  Do you often feel hopeless? No  Do you cry easily over simple problems? No  Activities of Daily Living In your present state of health, do you have any difficulty performing the following activities?:  Driving? Yes Managing money?  Yes Feeding yourself? No Getting from bed to chair? Yes Climbing a flight of stairs? Yes Preparing food and eating?: Yes Bathing or showering? Yes Getting  dressed: Yes Getting to the toilet? Yes Using the toilet:Yes Moving around from place to place: Yes In the past year have you fallen or had a near fall?:No    Hearing Difficulties: No Do you often ask people to speak up or repeat themselves? Yes Do you experience ringing or noises in your ears? No Do you have difficulty understanding soft or whispered voices? Yes   Do you feel that you have a problem with memory? Yes  Do you often misplace items? No  Do you feel safe at home?  Yes  Cognitive Testing  Alert? Yes  Normal Appearance?Yes  Oriented to person? Yes  Place? Yes   Time? No  Recall of three objects?  Yes  Can perform simple calculations? Yes  Displays appropriate judgment?Yes  Can read the correct time from a watch face?Yes  HER MMSE TODAY WAS 20/30   Fall Risk Prevention  Any stairs in or around the home? No  If so, are there any without handrails? No  Home free of loose throw rugs in walkways, pet beds, electrical cords, etc? Yes  Adequate lighting in your home to reduce risk of falls? Yes  Use of a cane, walker or w/c? Yes    Time Up and Go  Was the test performed? No .    Gait slow and steady with assistive device    Advanced Directives have been discussed with the patient? 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   History of kidney stones    Hyperlipidemia    Hypothyroidism    Myocardial infarction [facility])    Neuromuscular disorder (HCC)    tremors   Pneumonia due to COVID-19 virus    Postoperative  wound infection 03/10/2014   Seizures (HCC)    Sleep apnea    Stroke (HCC)    Tension-type headache, not intractable 01/10/2016   Thyroid  disease    Wide-complex tachycardia     Past Surgical History:  Procedure Laterality Date   ABDOMINAL HYSTERECTOMY     APPENDECTOMY     bladder tack     CHOLECYSTECTOMY     CORONARY ARTERY BYPASS GRAFT     CYSTOSCOPY W/ URETERAL STENT PLACEMENT Left 05/01/2021   Procedure: CYSTOSCOPY WITH RETROGRADE PYELOGRAM/URETERAL STENT PLACEMENT;  Surgeon: Devere Lonni Righter, MD;  Location: [facility] OR;  Service: Urology;  Laterality: Left;   CYSTOSCOPY/URETEROSCOPY/HOLMIUM LASER/STENT PLACEMENT Left 08/09/2021   Procedure: CYSTOSCOPY/URETEROSCOPY/HOLMIUM LASER/STENT EXCHANGE;  Surgeon: Devere Lonni Righter, MD;  Location: [facility] ORS;  Service: Urology;  Laterality: Left;   ESOPHAGOGASTRODUODENOSCOPY ENDOSCOPY     EYE SURGERY     FRACTURE SURGERY Right    3rd finger   IR FLUORO GUIDED NEEDLE PLC ASPIRATION/INJECTION LOC  12/27/2021   IR FLUORO GUIDED NEEDLE PLC ASPIRATION/INJECTION LOC  01/01/2022   JOINT REPLACEMENT Bilateral    knees   TONSILLECTOMY AND ADENOIDECTOMY     TUBAL LIGATION        Current Medications  Current Outpatient Medications  Medication Sig Dispense Refill   albuterol  (VENTOLIN  HFA) 108 (90 Base) MCG/ACT inhaler Inhale into the lungs.     aspirin  81 MG EC tablet Take 1 tablet (81 mg total) by mouth daily. Swallow whole. 30 tablet 0    Carbinoxamine  Maleate (RYVENT ) 6 MG TABS Take 1 tablet (6 mg total) by mouth 2 (two) times daily as needed. 60 tablet 5   donepezil  (ARICEPT ) 10 MG tablet TAKE 1 TABLET BY MOUTH EVERY DAY IN THE EVENING 90 tablet 1   FARXIGA  10 MG TABS tablet TAKE 1 TABLET BY MOUTH ONCE DAILY BEFORE BREAKFAST 30 tablet 0   guaiFENesin  (MUCINEX ) 600 MG 12 hr tablet Take 600 mg by mouth daily as needed for cough or to loosen phlegm.     ipratropium (ATROVENT ) 0.03 % nasal spray Place 2 sprays into both nostrils every 12 (twelve) hours. 30 mL 12   ipratropium-albuterol  (DUONEB) 0.5-2.5 (3) MG/3ML SOLN Take 3 mLs by nebulization every 6 (six) hours as needed. 360 mL 11   levocetirizine (XYZAL ) 5 MG tablet Take one tablet once daily 30 tablet 5   levothyroxine  (SYNTHROID ) 100 MCG tablet Take 1 tablet (100 mcg total) by mouth daily. 30 tablet 11   loperamide  (IMODIUM ) 2 MG capsule TAKE 1 CAPSULE BY MOUTH 4 TIMES DAILY AS NEEDED FOR DIARRHEA OR LOOSE STOOLS     nitroGLYCERIN  (NITROSTAT ) 0.4 MG SL tablet Place 0.4 mg under the tongue every 5 (five) minutes as needed.     Nystatin (GERHARDT'S BUTT CREAM) CREA Apply 1 Application topically 3 (three) times daily. 1 each 0   Olopatadine-Mometasone (RYALTRIS ) 665-25 MCG/ACT SUSP Place 2 sprays into both nostrils in the morning and at bedtime. 29 g 5   omeprazole  (PRILOSEC) 40 MG capsule TAKE 1 CAPSULE BY MOUTH TWICE A DAY BEFORE A MEAL 180 capsule 1   pregabalin  (LYRICA ) 75 MG capsule Take 1 capsule (75 mg total) by mouth 3 (three) times daily. 90 capsule 5   Probiotic Product (PROBIOTIC PO) Take 15 Billion Cells by mouth daily.     Propylene Glycol (SYSTANE COMPLETE OP) Apply 1 drop to eye as needed (Burning/Irritation).     Rivaroxaban  (XARELTO ) 15 MG TABS tablet Take 1 tablet by mouth once daily  90 tablet 1   topiramate  (TOPAMAX ) 200 MG tablet Take 1 tablet (200 mg total) by mouth 2 (two) times daily. 180 tablet 1   triamcinolone  cream (KENALOG ) 0.1 % Apply 1 Application  topically 2 (two) times daily. 80 g 0   No current facility-administered medications for this visit.    Allergies Ciprofloxacin, Prochlorperazine edisylate, Morphine , Prednisone , Reduced iso-alpha acids complex, Statins, Sulfa antibiotics, Azithromycin, and Latex   Social History Social History   Tobacco Use   Smoking status: Former    Current packs/day: 0.00    Average packs/day: 0.5 packs/day for 30.0 years (15.0 ttl pk-yrs)    Types: Cigarettes    Start date: 06/27/1970    Quit date: 06/26/2000    Years since quitting: 23.3   Smokeless tobacco: Never  Substance Use Topics   Alcohol  use: Not Currently     Review of Systems Review of Systems  Constitutional:  Negative for chills, fever and malaise/fatigue.  Eyes:  Negative for blurred vision and double vision.  Respiratory:  Positive for cough. Negative for hemoptysis, shortness of breath and wheezing.   Cardiovascular:  Negative for chest pain, palpitations and leg swelling.  Gastrointestinal:  Negative for abdominal pain, blood in stool, constipation, diarrhea, heartburn, melena, nausea and vomiting.  Genitourinary:  Negative for frequency and hematuria.  Musculoskeletal:  Negative for myalgias.  Skin:  Negative for itching and rash.  Neurological:  Negative for dizziness, weakness and headaches.  Endo/Heme/Allergies:  Negative for polydipsia.  Psychiatric/Behavioral:  Negative for depression. The patient is not nervous/anxious and does not have insomnia.      Physical Exam:      There is no height or weight on file to calculate BMI. There were no vitals taken for this visit.  Physical Exam Constitutional:      Appearance: Normal appearance. She is not ill-appearing.  HENT:     Head: Normocephalic and atraumatic.     Right Ear: Tympanic membrane, ear canal and external ear normal.     Left Ear: Tympanic membrane, ear canal and external ear normal.     Nose: Nose normal. No congestion or rhinorrhea.      Mouth/Throat:     Mouth: Mucous membranes are moist.     Pharynx: Oropharynx is clear. No posterior oropharyngeal erythema.  Eyes:     General: No scleral icterus.    Conjunctiva/sclera: Conjunctivae normal.     Pupils: Pupils are equal, round, and reactive to light.  Neck:     Thyroid : No thyromegaly.     Vascular: No carotid bruit.  Cardiovascular:     Rate and Rhythm: Normal rate and regular rhythm.     Pulses: Normal pulses.     Heart sounds: Normal heart sounds. No murmur heard.    No friction rub. No gallop.  Pulmonary:     Effort: Pulmonary effort is normal. No respiratory distress.     Breath sounds: Normal breath sounds. No wheezing, rhonchi or rales.  Abdominal:     General: Abdomen is flat. Bowel sounds are normal. There is no distension.     Palpations: Abdomen is soft.     Tenderness: There is no abdominal tenderness.  Musculoskeletal:     Cervical back: Normal range of motion. No tenderness.     Right lower leg: No edema.     Left lower leg: No edema.     Comments: No clubbing or cyanosis  Lymphadenopathy:     Cervical: No cervical adenopathy.  Skin:  General: Skin is warm and dry.     Findings: No rash.  Neurological:     General: No focal deficit present.     Mental Status: She is alert and oriented to person, place, and time.     Comments: CN II-XII grossly intact  Psychiatric:        Mood and Affect: Mood normal.        Behavior: Behavior normal.      Assessment:      No diagnosis found.    Plan:     During the course of the visit the patient was educated and counseled about appropriate screening and preventive services including:   Pneumococcal vaccine  Influenza vaccine Screening mammography Colorectal cancer screening Advanced directives: discuss with patient and husband  Diet review for nutrition referral? Yes ____  Not Indicated __X__   Patient Instructions (the written plan) was given to the patient.  No problem-specific  Assessment & Plan notes found for this encounter.    Prevention   Medicare Attestation I have personally reviewed: The patient's medical and social history Their use of alcohol , tobacco or illicit drugs Their current medications and supplements The patient's functional ability including ADLs,fall risks, home safety risks, cognitive, and hearing and visual impairment Diet and physical activities Evidence for depression or mood disorders  The patient's weight, height, and BMI have been recorded in the chart.  I have made referrals, counseling, and provided education to the patient based on review of the above and I have provided the patient with a written personalized care plan for preventive services.     Selinda Fleeta Finger, MD   10/24/2023

## 2023-10-24 NOTE — Progress Notes (Signed)
 New Rx

## 2023-10-26 LAB — MICROALBUMIN / CREATININE URINE RATIO
Creatinine, Urine: 91.2 mg/dL
Microalb/Creat Ratio: 186 mg/g{creat} — ABNORMAL HIGH (ref 0–29)
Microalbumin, Urine: 169.2 ug/mL

## 2023-10-28 LAB — CMP14 + ANION GAP
ALT: 3 IU/L (ref 0–32)
AST: 12 IU/L (ref 0–40)
Albumin: 3.9 g/dL (ref 3.8–4.8)
Alkaline Phosphatase: 106 IU/L (ref 44–121)
Anion Gap: 15 mmol/L (ref 10.0–18.0)
BUN/Creatinine Ratio: 14 (ref 12–28)
BUN: 15 mg/dL (ref 8–27)
Bilirubin Total: <0.2 mg/dL (ref 0.0–1.2)
CO2: 20 mmol/L (ref 20–29)
Calcium: 9.3 mg/dL (ref 8.7–10.3)
Chloride: 106 mmol/L (ref 96–106)
Creatinine, Ser: 1.07 mg/dL — ABNORMAL HIGH (ref 0.57–1.00)
Globulin, Total: 3.7 g/dL (ref 1.5–4.5)
Glucose: 127 mg/dL — ABNORMAL HIGH (ref 70–99)
Potassium: 4.4 mmol/L (ref 3.5–5.2)
Sodium: 141 mmol/L (ref 134–144)
Total Protein: 7.6 g/dL (ref 6.0–8.5)
eGFR: 53 mL/min/1.73 — ABNORMAL LOW (ref >59)

## 2023-10-28 LAB — TSH: TSH: 1.23 u[IU]/mL (ref 0.450–4.500)

## 2023-10-28 LAB — CBC WITH DIFFERENTIAL/PLATELET
Basophils Absolute: 0.2 x10E3/uL (ref 0.0–0.2)
Basos: 1 %
EOS (ABSOLUTE): 0.6 x10E3/uL — ABNORMAL HIGH (ref 0.0–0.4)
Eos: 5 %
Hematocrit: 46.1 % (ref 34.0–46.6)
Hemoglobin: 13.6 g/dL (ref 11.1–15.9)
Immature Grans (Abs): 0.1 x10E3/uL (ref 0.0–0.1)
Immature Granulocytes: 1 %
Lymphocytes Absolute: 3 x10E3/uL (ref 0.7–3.1)
Lymphs: 24 %
MCH: 24.5 pg — ABNORMAL LOW (ref 26.6–33.0)
MCHC: 29.5 g/dL — ABNORMAL LOW (ref 31.5–35.7)
MCV: 83 fL (ref 79–97)
Monocytes Absolute: 0.7 x10E3/uL (ref 0.1–0.9)
Monocytes: 6 %
Neutrophils Absolute: 7.8 x10E3/uL — ABNORMAL HIGH (ref 1.4–7.0)
Neutrophils: 63 %
Platelets: 499 x10E3/uL — ABNORMAL HIGH (ref 150–450)
RBC: 5.55 x10E6/uL — ABNORMAL HIGH (ref 3.77–5.28)
RDW: 18.5 % — ABNORMAL HIGH (ref 11.7–15.4)
WBC: 12.2 x10E3/uL — ABNORMAL HIGH (ref 3.4–10.8)

## 2023-10-28 LAB — LIPID PANEL
Chol/HDL Ratio: 4.8 ratio — ABNORMAL HIGH (ref 0.0–4.4)
Cholesterol, Total: 257 mg/dL — ABNORMAL HIGH (ref 100–199)
HDL: 53 mg/dL (ref >39)
LDL Chol Calc (NIH): 172 mg/dL — ABNORMAL HIGH (ref 0–99)
Triglycerides: 175 mg/dL — ABNORMAL HIGH (ref 0–149)
VLDL Cholesterol Cal: 32 mg/dL (ref 5–40)

## 2023-10-28 LAB — VITAMIN D 25 HYDROXY (VIT D DEFICIENCY, FRACTURES): Vit D, 25-Hydroxy: 34.4 ng/mL (ref 30.0–100.0)

## 2023-10-28 LAB — T4, FREE: Free T4: 1.13 ng/dL (ref 0.82–1.77)

## 2023-10-28 LAB — RPR: RPR Ser Ql: NONREACTIVE

## 2023-10-28 LAB — URINE CULTURE

## 2023-10-28 LAB — VITAMIN B12: Vitamin B-12: 552 pg/mL (ref 232–1245)

## 2023-10-28 LAB — HEMOGLOBIN A1C

## 2023-10-29 ENCOUNTER — Encounter: Payer: Self-pay | Admitting: Pulmonary Disease

## 2023-11-05 ENCOUNTER — Ambulatory Visit: Payer: Self-pay

## 2023-11-05 NOTE — Progress Notes (Signed)
 Patient called.  Patient aware.  I have called and informed the patient  She needs to be on keflex  500mg  po BID x 5 days for her UTI.  They cancelled her HgBA1c.  She needs to come back in and do it.  Her choelseterol levels are elevated but she cannot take a statin. SABRA  Pt aware, reports are UTI symptoms are better and does not need medication. The patient is scheduling for a nurse visit A1C.

## 2023-11-06 ENCOUNTER — Ambulatory Visit: Admitting: Internal Medicine

## 2023-11-06 DIAGNOSIS — E1159 Type 2 diabetes mellitus with other circulatory complications: Secondary | ICD-10-CM

## 2023-11-06 LAB — HEMOGLOBIN A1C
Est. average glucose Bld gHb Est-mCnc: 134 mg/dL
Hgb A1c MFr Bld: 6.3 % — ABNORMAL HIGH (ref 4.8–5.6)

## 2023-11-06 NOTE — Progress Notes (Signed)
 Nurse Visit Lab Draw A1C

## 2023-11-07 ENCOUNTER — Other Ambulatory Visit: Payer: Self-pay | Admitting: Internal Medicine

## 2023-11-08 ENCOUNTER — Ambulatory Visit: Payer: Self-pay

## 2023-11-08 NOTE — Progress Notes (Signed)
 Her A1c looks good.   Pt's husband aware of labs

## 2023-12-28 ENCOUNTER — Other Ambulatory Visit: Payer: Self-pay | Admitting: Internal Medicine

## 2024-01-06 ENCOUNTER — Other Ambulatory Visit: Payer: Self-pay | Admitting: Internal Medicine

## 2024-01-27 ENCOUNTER — Ambulatory Visit: Admitting: Internal Medicine

## 2024-01-27 ENCOUNTER — Encounter: Payer: Self-pay | Admitting: Internal Medicine

## 2024-01-27 VITALS — BP 120/70 | HR 85 | Temp 98.0°F | Resp 16 | Ht 62.0 in | Wt 116.0 lb

## 2024-01-27 DIAGNOSIS — N1832 Chronic kidney disease, stage 3b: Secondary | ICD-10-CM | POA: Diagnosis not present

## 2024-01-27 DIAGNOSIS — E1159 Type 2 diabetes mellitus with other circulatory complications: Secondary | ICD-10-CM

## 2024-01-27 DIAGNOSIS — I10 Essential (primary) hypertension: Secondary | ICD-10-CM | POA: Diagnosis not present

## 2024-01-27 DIAGNOSIS — R519 Headache, unspecified: Secondary | ICD-10-CM | POA: Diagnosis not present

## 2024-01-27 DIAGNOSIS — Z23 Encounter for immunization: Secondary | ICD-10-CM

## 2024-01-27 DIAGNOSIS — G8929 Other chronic pain: Secondary | ICD-10-CM | POA: Insufficient documentation

## 2024-01-27 DIAGNOSIS — E1121 Type 2 diabetes mellitus with diabetic nephropathy: Secondary | ICD-10-CM | POA: Diagnosis not present

## 2024-01-27 MED ORDER — PREGABALIN 100 MG PO CAPS: 100.0000 mg | ORAL_CAPSULE | Freq: Three times a day (TID) | ORAL | 2 refills | Status: AC

## 2024-01-27 NOTE — Assessment & Plan Note (Signed)
 Plan as above.

## 2024-01-27 NOTE — Assessment & Plan Note (Signed)
 Her diabetes has been controlled.  We will check her HgBa1c on her next visit.

## 2024-01-27 NOTE — Assessment & Plan Note (Signed)
 She remains on farxiga  at this time.  We will continue to control her diabetes and BP.  She is to avoid NSAIDS.

## 2024-01-27 NOTE — Assessment & Plan Note (Signed)
 Her BP is controlled.  We will continue to monitor.  She is not on any medications at this time.

## 2024-01-27 NOTE — Addendum Note (Signed)
 Addended by: LENETTA LACKS on: 01/27/2024 10:50 AM   Modules accepted: Orders

## 2024-01-27 NOTE — Progress Notes (Addendum)
 Office Visit  Subjective   Patient ID: Jenny Kelly   DOB: 10/16/1945   Age: 78 y.o.   MRN: [id number]   Chief Complaint Chief Complaint  Patient presents with  . Diabetes    3 month follow up     History of Present Illness Jenny Kelly is a 78 yo female who also returns for her history of post stroke headaches. Today, she states that her headaches are doing worse over the last 2 weeks.  Her headaches are occurring daily across her forehead.   Last year, she had worsening headaches and at that time I felt she had a vascular component to her headaches.  She was taking oxycodone  given to her by Jolynn Pack for her headaches and I asked her to stop this.  I did place her on low dose labetalol  for headache prevention and increased her lyrica  from 75mg  BID to 75mg  TID.  She is adding tyelnol 650mg  at times to help.  In the past, we had tried her on tylenol  #3 with caffeine  but the patient only took this a couple of times per her husband.  She states it did not work.  She remains on topamax  200mg  BID.  Again, she was admitted to [facility] in 12/2021 for intractable headaches.  She has a history of post stroke headaches where I have placed her on lyrica  in the past which has helped this.  She developed a severe bitemporal headache (like a band across her forehead) on the day of admission where she initially thought she was having a stroke and 911 was called.  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  Neurology was consulted and the patient underwent a MRI and CT scans of the brain which were negative.  They did a temporal artery US  which was negative.  Neurology recommended a CT myelogram which showed a CSF contrast extending into the right L4-L5 foramen.  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  She continued to have have symptoms and she went for a repeat blood patch and her symptoms  improved.  The doctors wanted her to followup with [facility] neurology as an outpatient.  She has not followed up with Neurology.   Today, she states her headaches are still occurring daily and she is taking her lyrica  75mg  in AM and PM but she takes an extra dose in the afternoon.  She states tylenol  does help.  There is no new weakness/numbness, change in her vision or other problems.  Neurology did a  CT scan of her head in 05/2015 and this showed an old stroke but nothing new. She did have an overnight EEG done on 12/30/2021 and this showed moderate diffuse encephalopathy with nonspecific etiology but no seizures were noted.  She had a MRI of her brain on 12/27/2021 which showed no acute intracranial abnormality but there were old unchanged signal abnormality in her right basal ganglia related to prior infarct.  There was encephalomalacia involving the right MCA territory including the right basal ganglia.    The patient is a 78 year old Caucasian/White female who returns for a follow-up visit for her diet controlled diabetes. Since the last visit, there have been no problems. She remains on no medications; the diabetes is being managed by dietary intervention alone. She is not walking as much as they would like.. She specifically denies  unexplained abdominal pain, nausea or vomiting and documented hypoglycemia. She has not been checking her blood sugars at home as they do not have a glucometer. She came in fasting today in anticipation of lab work. Her last HgBA1c was done 3 months ago and was 6.3%.  There are no complications of diabetic retinopathy or neuropathy.  She has CKD probably secondary to her HTN and diabetic nephropathy.  She has a history of cardiovascular diease as well and history of stroke but her diabetes has always been controlled. She had a dilated diabetic eye exam done by Dr. Linda in 11/2022 but I do not have this record.   The patient has a history of Stage IIIb CKD.  She was seeing the  nephrologist once every 3-4 months with her last visit was in 2019 and she states that she is not going back.  Her creatinine at that time was 1.5.  Her GFR is around from 30-50 however she had recent labs this past year that showed her GFR was greater than 60.   We have noted that she went from Stage IIIa to Stage IIIb CKD over the past 1-2 years.  We did a urine study with albumin/creatinine ratio on her last visit and she was spilling protein.  We therefore started her on farxiga  10mg  daily. She denies any NSAIDS use.   The patient is a 78 year old Caucasian/White female who returns today for followup of her hypertension.  Since her last visit, her patient has not had any problems.  The patient has not been checking her BP at home.  The patient's current medications include:  none.  She used to be on midodrine  10 mg oral tablet TID.  She has had problems with orthostatic hypotension in the past but has not had a problems with this for years.  The patient denies any dizziness, lightheadness, blurred vision, chest pain, shortness of breath, generalized weakness or edema      Past Medical History Past Medical History:  Diagnosis Date  . 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 RK 9.3 cm and LK 11.2 cm on renal US   . 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 Allergy  06/09/2021  . Anemia   . Anxiety   . Arthritis   . Asthma   . Atrial fibrillation (HCC)   . Blood transfusion without reported diagnosis   . Cataract   . CHF (congestive heart failure) (HCC)   . Chronic kidney disease   . Clotting disorder   . COPD (chronic obstructive pulmonary disease) (HCC)   . Dementia (HCC)   . Depression   . Diabetes mellitus without complication (HCC)   . Dyspnea   . GERD (gastroesophageal reflux disease)   . Great toe pain, left 11/01/2021  . Headache   . Heart murmur   . 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 History of  kidney stones   . Hyperlipidemia   . Hypothyroidism   . Myocardial infarction (HCC)   . Neuromuscular disorder (HCC)    tremors  . Pneumonia due to COVID-19 virus   . Postoperative wound infection 03/10/2014  . Seizures (HCC)   . Sleep apnea   . Stroke (HCC)   . Tension-type headache, not intractable 01/10/2016  . Thyroid  disease   . Wide-complex tachycardia      Allergies Allergies  Allergen Reactions  . Ciprofloxacin Hives    Ask patient  . Prochlorperazine Edisylate Other (See  Comments)    Cant swallow Muscle cramping  . Morphine  Other (See Comments)    Confusion  . Prednisone  Other (See Comments)    unknown  . Reduced Iso-Alpha Acids Complex Other (See Comments)    unknown  . Statins Other (See Comments)    Muscle weakness   . Sulfa Antibiotics Swelling  . Azithromycin Rash  . Latex Rash     Medications  Current Outpatient Medications:  .  albuterol  (VENTOLIN  HFA) 108 (90 Base) MCG/ACT inhaler, Inhale into the lungs., Disp: , Rfl:  .  aspirin  81 MG EC tablet, Take 1 tablet (81 mg total) by mouth daily. Swallow whole., Disp: 30 tablet, Rfl: 0 .  Carbinoxamine  Maleate (RYVENT ) 6 MG TABS, Take 1 tablet (6 mg total) by mouth 2 (two) times daily as needed., Disp: 60 tablet, Rfl: 5 .  donepezil  (ARICEPT ) 10 MG tablet, TAKE 1 TABLET BY MOUTH EVERY DAY IN THE EVENING, Disp: 90 tablet, Rfl: 1 .  FARXIGA  10 MG TABS tablet, TAKE 1 TABLET BY MOUTH ONCE DAILY BEFORE BREAKFAST, Disp: 30 tablet, Rfl: 0 .  guaiFENesin  (MUCINEX ) 600 MG 12 hr tablet, Take 600 mg by mouth daily as needed for cough or to loosen phlegm., Disp: , Rfl:  .  ipratropium (ATROVENT ) 0.03 % nasal spray, Place 2 sprays into both nostrils every 12 (twelve) hours., Disp: 30 mL, Rfl: 12 .  ipratropium-albuterol  (DUONEB) 0.5-2.5 (3) MG/3ML SOLN, Take 3 mLs by nebulization every 6 (six) hours as needed., Disp: 360 mL, Rfl: 11 .  levocetirizine (XYZAL ) 5 MG tablet, Take one tablet once daily, Disp: 30 tablet, Rfl:  5 .  levothyroxine  (SYNTHROID ) 100 MCG tablet, Take 1 tablet (100 mcg total) by mouth daily., Disp: 30 tablet, Rfl: 11 .  loperamide  (IMODIUM ) 2 MG capsule, TAKE 1 CAPSULE BY MOUTH 4 TIMES DAILY AS NEEDED FOR DIARRHEA OR LOOSE STOOLS, Disp: , Rfl:  .  nitroGLYCERIN  (NITROSTAT ) 0.4 MG SL tablet, Place 0.4 mg under the tongue every 5 (five) minutes as needed., Disp: , Rfl:  .  Nystatin (GERHARDT'S BUTT CREAM) CREA, Apply 1 Application topically 3 (three) times daily., Disp: 1 each, Rfl: 0 .  Olopatadine-Mometasone (RYALTRIS ) 665-25 MCG/ACT SUSP, Place 2 sprays into both nostrils in the morning and at bedtime., Disp: 29 g, Rfl: 5 .  omeprazole  (PRILOSEC) 40 MG capsule, TAKE 1 CAPSULE BY MOUTH TWICE A DAY BEFORE MEALS, Disp: 180 capsule, Rfl: 1 .  pregabalin  (LYRICA ) 100 MG capsule, Take 1 capsule (100 mg total) by mouth 3 (three) times daily., Disp: 90 capsule, Rfl: 2 .  Probiotic Product (PROBIOTIC PO), Take 15 Billion Cells by mouth daily., Disp: , Rfl:  .  Propylene Glycol (SYSTANE COMPLETE OP), Apply 1 drop to eye as needed (Burning/Irritation)., Disp: , Rfl:  .  Rivaroxaban  (XARELTO ) 15 MG TABS tablet, Take 1 tablet by mouth once daily, Disp: 90 tablet, Rfl: 1 .  topiramate  (TOPAMAX ) 200 MG tablet, Take 1 tablet (200 mg total) by mouth 2 (two) times daily., Disp: 180 tablet, Rfl: 1 .  triamcinolone  cream (KENALOG ) 0.1 %, Apply 1 Application topically 2 (two) times daily., Disp: 80 g, Rfl: 0   Review of Systems Review of Systems  Constitutional:  Negative for chills and fever.  Eyes:  Negative for blurred vision and double vision.  Respiratory:  Negative for cough and shortness of breath.   Cardiovascular:  Negative for chest pain, palpitations and leg swelling.  Gastrointestinal:  Positive for constipation. Negative for abdominal pain, diarrhea, nausea  and vomiting.  Genitourinary:  Negative for frequency.  Musculoskeletal:  Negative for myalgias.  Skin:  Negative for itching and rash.   Neurological:  Positive for headaches. Negative for dizziness and weakness.  Endo/Heme/Allergies:  Negative for polydipsia.       Objective:    Vitals BP 120/70   Pulse 85   Temp 98 F (36.7 C)   Resp 16   Ht 5' 2 (1.575 m)   Wt 116 lb (52.6 kg)   SpO2 94%   BMI 21.22 kg/m    Physical Examination Physical Exam Constitutional:      Appearance: Normal appearance. She is not ill-appearing.  Cardiovascular:     Rate and Rhythm: Normal rate and regular rhythm.     Pulses: Normal pulses.     Heart sounds: No murmur heard.    No friction rub. No gallop.  Pulmonary:     Effort: Pulmonary effort is normal. No respiratory distress.     Breath sounds: No wheezing, rhonchi or rales.  Abdominal:     General: Bowel sounds are normal. There is no distension.     Palpations: Abdomen is soft.     Tenderness: There is no abdominal tenderness.  Musculoskeletal:     Right lower leg: No edema.     Left lower leg: No edema.  Skin:    General: Skin is warm and dry.     Findings: No rash.  Neurological:     General: No focal deficit present.     Mental Status: She is alert and oriented to person, place, and time.  Psychiatric:        Mood and Affect: Mood normal.        Behavior: Behavior normal.        Assessment & Plan:   Essential hypertension Her BP is controlled.  We will continue to monitor.  She is not on any medications at this time.  Type 2 diabetes mellitus with other circulatory complications (HCC) Her diabetes has been controlled.  We will check her HgBa1c on her next visit.  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  Stage 3b chronic kidney disease (HCC) She remains on farxiga  at this time.  We will continue to control her diabetes and BP.  She is to avoid NSAIDS.  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  I am going to try her on an increased dose of lyrica  from  75mg  TID to 100mg  TID.  She can continue to supplment with tylenol .     Return in about 3 months (around 04/28/2024).   Selinda Fleeta Finger, MD

## 2024-01-27 NOTE — Assessment & Plan Note (Signed)
 She has post stroke headaches which we have treated for years.  I am going to try her on an increased dose of lyrica  from 75mg  TID to 100mg  TID.  She can continue to supplment with tylenol .

## 2024-01-30 ENCOUNTER — Other Ambulatory Visit: Payer: Self-pay | Admitting: Internal Medicine

## 2024-02-19 ENCOUNTER — Telehealth: Admitting: Internal Medicine

## 2024-02-19 DIAGNOSIS — I69354 Hemiplegia and hemiparesis following cerebral infarction affecting left non-dominant side: Secondary | ICD-10-CM | POA: Insufficient documentation

## 2024-02-19 DIAGNOSIS — M1712 Unilateral primary osteoarthritis, left knee: Secondary | ICD-10-CM | POA: Insufficient documentation

## 2024-02-19 NOTE — Assessment & Plan Note (Signed)
 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  We will refer her to ortho.

## 2024-02-19 NOTE — Assessment & Plan Note (Signed)
 She has worsening weakness where we will get homehealth therapies involved.

## 2024-02-19 NOTE — Progress Notes (Signed)
 Office Visit  Subjective   Patient ID: Jenny Kelly   DOB: Mar 18, 1946   Age: 78 y.o.   MRN: [id number]   Chief Complaint No chief complaint on file.    History of Present Illness Mrs. Klar is a 78 yo female who calls in today for a telehealth due to pain and weakness of the left side where she is unable to get up on her own.  Her husband states she began having worsening weakness of her left side and especially pain in her left knee.  She has a history of stroke where she was admitted to [facility] on 05/01/2021 with new onset left side weakness.  A CT scan of her head done there showed a new acute M1 and M2 occlusion.  The patient was noted to have continued left side weakness where she was sent to inpatient rehab where she stayed from 1/14 until 1/24 back in 2023.  ince then, she has had new left side weakness from this stroke that effects both her left arm and left leg.  Since her last visit with me, she states the weakness in her left arm.  She does use a rollator and a cane at home.  She has been has worsening left knee pain where she has had a knee replacement in the past.  However this pain has limited her getting up and since then she states the weakness is worse on the left.  She denies any new stroke problems no slurred speech, change in mentation, vision problems or other focal deficits.  Her husband states that he can no longer get her up to bring her in to the office and she refuses to go to the ER.       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   Allergy  06/09/2021   Anemia    Anxiety    Arthritis    Asthma    Atrial fibrillation (HCC)    Blood transfusion without reported diagnosis    Cataract    CHF (congestive heart failure) (HCC)    Chronic kidney disease     Clotting disorder    COPD (chronic obstructive pulmonary disease) (HCC)    Dementia (HCC)    Depression    Diabetes mellitus without complication (HCC)    Dyspnea    GERD (gastroesophageal reflux disease)    Great toe pain, left 11/01/2021   Headache    Heart murmur    History of CVA with residual deficit 11/01/2021   History of kidney stones    Hyperlipidemia    Hypothyroidism    Myocardial infarction [facility])    Neuromuscular disorder (HCC)    tremors   Pneumonia due to COVID-19 virus    Postoperative wound infection 03/10/2014   Seizures (HCC)    Sleep apnea    Stroke (HCC)    Tension-type headache, not intractable 01/10/2016   Thyroid  disease    Wide-complex tachycardia      Allergies Allergies  Allergen Reactions   Ciprofloxacin Hives    Ask patient   Prochlorperazine Edisylate Other (See Comments)    Cant swallow Muscle cramping   Morphine  Other (See Comments)    Confusion   Prednisone  Other (See Comments)    unknown   Reduced Iso-Alpha Acids Complex Other (See  Comments)    unknown   Statins Other (See Comments)    Muscle weakness    Sulfa Antibiotics Swelling   Azithromycin Rash   Latex Rash     Medications  Current Outpatient Medications:    albuterol  (VENTOLIN  HFA) 108 (90 Base) MCG/ACT inhaler, Inhale into the lungs., Disp: , Rfl:    aspirin  81 MG EC tablet, Take 1 tablet (81 mg total) by mouth daily. Swallow whole., Disp: 30 tablet, Rfl: 0   Carbinoxamine  Maleate (RYVENT ) 6 MG TABS, Take 1 tablet (6 mg total) by mouth 2 (two) times daily as needed., Disp: 60 tablet, Rfl: 5   donepezil  (ARICEPT ) 10 MG tablet, TAKE 1 TABLET BY MOUTH EVERY DAY IN THE EVENING, Disp: 90 tablet, Rfl: 1   FARXIGA  10 MG TABS tablet, TAKE 1 TABLET BY MOUTH ONCE DAILY BEFORE BREAKFAST, Disp: 30 tablet, Rfl: 0   guaiFENesin  (MUCINEX ) 600 MG 12 hr tablet, Take 600 mg by mouth daily as needed for cough or to loosen phlegm., Disp: , Rfl:    ipratropium (ATROVENT ) 0.03 % nasal  spray, Place 2 sprays into both nostrils every 12 (twelve) hours., Disp: 30 mL, Rfl: 12   ipratropium-albuterol  (DUONEB) 0.5-2.5 (3) MG/3ML SOLN, Take 3 mLs by nebulization every 6 (six) hours as needed., Disp: 360 mL, Rfl: 11   levocetirizine (XYZAL ) 5 MG tablet, Take one tablet once daily, Disp: 30 tablet, Rfl: 5   levothyroxine  (SYNTHROID ) 100 MCG tablet, Take 1 tablet (100 mcg total) by mouth daily., Disp: 30 tablet, Rfl: 11   loperamide  (IMODIUM ) 2 MG capsule, TAKE 1 CAPSULE BY MOUTH 4 TIMES DAILY AS NEEDED FOR DIARRHEA OR LOOSE STOOLS, Disp: , Rfl:    nitroGLYCERIN  (NITROSTAT ) 0.4 MG SL tablet, Place 0.4 mg under the tongue every 5 (five) minutes as needed., Disp: , Rfl:    Nystatin (GERHARDT'S BUTT CREAM) CREA, Apply 1 Application topically 3 (three) times daily., Disp: 1 each, Rfl: 0   Olopatadine-Mometasone (RYALTRIS ) 665-25 MCG/ACT SUSP, Place 2 sprays into both nostrils in the morning and at bedtime., Disp: 29 g, Rfl: 5   omeprazole  (PRILOSEC) 40 MG capsule, TAKE 1 CAPSULE BY MOUTH TWICE A DAY BEFORE MEALS, Disp: 180 capsule, Rfl: 1   pregabalin  (LYRICA ) 100 MG capsule, Take 1 capsule (100 mg total) by mouth 3 (three) times daily., Disp: 90 capsule, Rfl: 2   Probiotic Product (PROBIOTIC PO), Take 15 Billion Cells by mouth daily., Disp: , Rfl:    Propylene Glycol (SYSTANE COMPLETE OP), Apply 1 drop to eye as needed (Burning/Irritation)., Disp: , Rfl:    Rivaroxaban  (XARELTO ) 15 MG TABS tablet, Take 1 tablet by mouth once daily, Disp: 90 tablet, Rfl: 1   topiramate  (TOPAMAX ) 200 MG tablet, Take 1 tablet (200 mg total) by mouth 2 (two) times daily., Disp: 180 tablet, Rfl: 1   triamcinolone  cream (KENALOG ) 0.1 %, Apply 1 Application topically 2 (two) times daily., Disp: 80 g, Rfl: 0   Review of Systems Review of Systems  Constitutional:  Negative for chills and fever.  Eyes:  Negative for blurred vision and double vision.  Respiratory:  Negative for cough and shortness of breath.    Cardiovascular:  Negative for chest pain, palpitations and leg swelling.  Gastrointestinal:  Negative for abdominal pain, constipation, diarrhea, nausea and vomiting.  Genitourinary:  Negative for dysuria, frequency and hematuria.  Musculoskeletal:  Positive for joint pain. Negative for falls.  Skin:  Negative for rash.  Neurological:  Positive for weakness. Negative for dizziness and  focal weakness.       Objective:    Vitals There were no vitals taken for this visit.   Physical Examination Physical Exam Constitutional:      Appearance: Normal appearance.  Neurological:     Mental Status: She is alert.  Psychiatric:        Mood and Affect: Mood normal.        Behavior: Behavior normal.        Assessment & Plan:   No problem-specific Assessment & Plan notes found for this encounter.    No follow-ups on file.   Selinda Fleeta Finger, MD

## 2024-02-24 DIAGNOSIS — E1140 Type 2 diabetes mellitus with diabetic neuropathy, unspecified: Secondary | ICD-10-CM | POA: Diagnosis not present

## 2024-02-24 DIAGNOSIS — I509 Heart failure, unspecified: Secondary | ICD-10-CM | POA: Diagnosis not present

## 2024-02-24 DIAGNOSIS — E1159 Type 2 diabetes mellitus with other circulatory complications: Secondary | ICD-10-CM | POA: Diagnosis not present

## 2024-02-24 DIAGNOSIS — I130 Hypertensive heart and chronic kidney disease with heart failure and stage 1 through stage 4 chronic kidney disease, or unspecified chronic kidney disease: Secondary | ICD-10-CM | POA: Diagnosis not present

## 2024-02-27 DIAGNOSIS — S8002XA Contusion of left knee, initial encounter: Secondary | ICD-10-CM | POA: Diagnosis not present

## 2024-02-27 DIAGNOSIS — Z96652 Presence of left artificial knee joint: Secondary | ICD-10-CM | POA: Diagnosis not present

## 2024-02-28 ENCOUNTER — Other Ambulatory Visit: Payer: Self-pay | Admitting: Internal Medicine

## 2024-03-09 ENCOUNTER — Other Ambulatory Visit: Payer: Self-pay

## 2024-03-09 MED ORDER — DOXYCYCLINE MONOHYDRATE 100 MG PO TABS: 100.0000 mg | ORAL_TABLET | Freq: Two times a day (BID) | ORAL | 0 refills | Status: AC

## 2024-03-29 ENCOUNTER — Other Ambulatory Visit: Payer: Self-pay | Admitting: Internal Medicine

## 2024-04-10 ENCOUNTER — Other Ambulatory Visit: Payer: Self-pay | Admitting: Cardiology

## 2024-04-10 DIAGNOSIS — I480 Paroxysmal atrial fibrillation: Secondary | ICD-10-CM

## 2024-04-10 NOTE — Telephone Encounter (Signed)
 Xarelto  15mg  refill request received. Pt is 78 years old, weight-52.6kg, Crea-1.07 on 10/24/23, last seen by Dr. Bernie on 08/09/23, Diagnosis-Afib, CrCl- 35.98 mL/min; Dose is appropriate based on dosing criteria. Will send in refill to requested pharmacy.

## 2024-04-28 ENCOUNTER — Ambulatory Visit: Admitting: Internal Medicine

## 2024-04-28 ENCOUNTER — Encounter: Payer: Self-pay | Admitting: Internal Medicine

## 2024-04-28 VITALS — BP 110/70 | HR 102 | Temp 98.1°F | Resp 18 | Ht 62.0 in | Wt 123.2 lb

## 2024-04-28 DIAGNOSIS — I2510 Atherosclerotic heart disease of native coronary artery without angina pectoris: Secondary | ICD-10-CM | POA: Diagnosis not present

## 2024-04-28 DIAGNOSIS — E1121 Type 2 diabetes mellitus with diabetic nephropathy: Secondary | ICD-10-CM | POA: Diagnosis not present

## 2024-04-28 DIAGNOSIS — I10 Essential (primary) hypertension: Secondary | ICD-10-CM | POA: Diagnosis not present

## 2024-04-28 DIAGNOSIS — G44029 Chronic cluster headache, not intractable: Secondary | ICD-10-CM | POA: Diagnosis not present

## 2024-04-28 DIAGNOSIS — E039 Hypothyroidism, unspecified: Secondary | ICD-10-CM | POA: Diagnosis not present

## 2024-04-28 MED ORDER — SYNTHROID 100 MCG PO TABS: 100.0000 ug | ORAL_TABLET | Freq: Every day | ORAL | 11 refills | Status: AC

## 2024-04-28 NOTE — Progress Notes (Signed)
 "  Office Visit  Subjective   Patient ID: Jenny Kelly   DOB: January 30, 1946   Age: 79 y.o.   MRN: [id number]   Chief Complaint Chief Complaint  Patient presents with   Follow-up    3 Month follow up     History of Present Illness 79 years old female who is here for follow-up.  She was recently treated for pneumonia and she says that she is better but not back to normal.  She said that she has a runny nose all the time and she has tried different medication but nothing helped.  She was seen by allergy  doctor and they could not find what kind of allergies she has.  Husband do not wanted to give any medication for runny nose.  They were told by a nurse that this could be a side effect of levothyroxine  that she takes 100 mcg daily and TSH level was done that was therapeutic in July.  She has type 2 diabetes mellitus and her hemoglobin A1c was 6.5 on November 06, 2023.  She takes Farxiga  10 mg daily.  She also has a history of stroke with poststroke headache and she takes pregabalin  and Topamax  and that is helping little.  She says that she has dull headache and stayed there all the time and comes almost every day.  She also has hyperlipidemia but her cholesterol is not at target controlled.  She could not tolerate any statin.  She was started on Repatha and she has pancreatitis twice from that injection so husband do not want to give her any medicine for cholesterol.  They understand the risk for uncontrolled hyperlipidemia.  She has history of coronary artery disease and paroxysmal atrial fibrillation.  Her heart rate is 101 today.  She takes Xarelto  15 mg daily and will follow-up with cardiologist every 6 months.  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   Allergy  06/09/2021   Anemia    Anxiety     Arthritis    Asthma    Atrial fibrillation (HCC)    Blood transfusion without reported diagnosis    Cataract    CHF (congestive heart failure) (HCC)    Chronic kidney disease    Clotting disorder    COPD (chronic obstructive pulmonary disease) (HCC)    Dementia (HCC)    Depression    Diabetes mellitus without complication (HCC)    Dyspnea    GERD (gastroesophageal reflux disease)    Great toe pain, left 11/01/2021   Headache    Heart murmur    History of CVA with residual deficit 11/01/2021   History of kidney stones    Hyperlipidemia    Hypothyroidism    Myocardial infarction [facility])    Neuromuscular disorder (HCC)    tremors   Pneumonia due to COVID-19 virus    Postoperative wound infection 03/10/2014   Seizures (HCC)    Sleep apnea    Stroke (HCC)    Tension-type headache, not intractable 01/10/2016   Thyroid  disease    Wide-complex tachycardia      Allergies Allergies[1]   Review of Systems Review of Systems  Constitutional: Negative.   HENT:  Positive for congestion.   Respiratory:  Positive for cough.   Cardiovascular: Negative.   Gastrointestinal: Negative.   Neurological:  Positive for headaches.       Objective:    Vitals BP 110/70   Pulse (!) 102   Temp 98.1 F (36.7 C) (Temporal)   Resp 18   Ht 5' 2 (1.575 m)   Wt 123 lb 3.2 oz (55.9 kg)   SpO2 91%   BMI 22.53 kg/m    Physical Examination Physical Exam Constitutional:      Appearance: Normal appearance.  HENT:     Head: Normocephalic and atraumatic.  Cardiovascular:     Rate and Rhythm: Normal rate and regular rhythm.     Heart sounds: Normal heart sounds.  Pulmonary:     Effort: Pulmonary effort is normal.     Breath sounds: Normal breath sounds.  Abdominal:     General: Bowel sounds are normal.     Palpations: Abdomen is soft.  Neurological:     General: No focal deficit present.     Mental Status: She is alert and oriented to person, place, and time.        Assessment  & Plan:   Essential hypertension Blood pressure is well-controlled.  Coronary artery disease involving native coronary artery of native heart without angina pectoris Stable and she follows with cardiologist.  She take baby aspirin  for cardiovascular prevention.  Diabetic nephropathy associated with type 2 diabetes mellitus (HCC) She has a chronic kidney disease and diabetes is well-controlled.  Her GFR was 53 on July lab and has been stable.  She takes Farxiga  10 mg daily for that.  Cluster headache, not intractable She takes pregabalin  and Topamax  and that is helping.  Hypothyroidism Levothyroxine  to Synthroid  to see if this will help with the runny nose.  I will send Synthroid  100 mcg daily and no substitute for pharmacy.    Return in about 3 months (around 07/27/2024).   [name], MD      [1]  Allergies Allergen Reactions   Ciprofloxacin Hives    Ask patient   Prochlorperazine Edisylate Other (See Comments)    Cant swallow Muscle cramping   Morphine  Other (See Comments)    Confusion   Prednisone  Other (See Comments)    unknown   Reduced Iso-Alpha Acids Complex Other (See Comments)    unknown   Statins Other (See Comments)    Muscle weakness    Sulfa Antibiotics Swelling   Azithromycin Rash   Latex Rash   "

## 2024-04-28 NOTE — Assessment & Plan Note (Signed)
 Levothyroxine  to Synthroid  to see if this will help with the runny nose.  I will send Synthroid  100 mcg daily and no substitute for pharmacy.

## 2024-04-28 NOTE — Assessment & Plan Note (Signed)
 Blood pressure is well controlled

## 2024-04-28 NOTE — Assessment & Plan Note (Signed)
 Stable and she follows with cardiologist.  She take baby aspirin  for cardiovascular prevention.

## 2024-04-28 NOTE — Assessment & Plan Note (Signed)
 She has a chronic kidney disease and diabetes is well-controlled.  Her GFR was 53 on July lab and has been stable.  She takes Farxiga  10 mg daily for that.

## 2024-04-28 NOTE — Assessment & Plan Note (Signed)
 She takes pregabalin  and Topamax  and that is helping.

## 2024-05-06 ENCOUNTER — Other Ambulatory Visit: Payer: Self-pay

## 2024-05-06 MED ORDER — FARXIGA 10 MG PO TABS: 10.0000 mg | ORAL_TABLET | Freq: Every day | ORAL | 0 refills | Status: AC

## 2024-07-27 ENCOUNTER — Ambulatory Visit: Admitting: Internal Medicine
